# Patient Record
Sex: Female | Born: 1970 | Race: Black or African American | Hispanic: No | Marital: Married | Smoking: Never smoker
Health system: Southern US, Community
[De-identification: ages and names within clinical notes are randomized; demographics above are authoritative.]

## PROBLEM LIST (undated history)

## (undated) DIAGNOSIS — Z8489 Family history of other specified conditions: Secondary | ICD-10-CM

## (undated) DIAGNOSIS — R7303 Prediabetes: Secondary | ICD-10-CM

## (undated) DIAGNOSIS — G709 Myoneural disorder, unspecified: Secondary | ICD-10-CM

## (undated) DIAGNOSIS — Z87898 Personal history of other specified conditions: Secondary | ICD-10-CM

## (undated) DIAGNOSIS — K219 Gastro-esophageal reflux disease without esophagitis: Secondary | ICD-10-CM

## (undated) DIAGNOSIS — Z923 Personal history of irradiation: Secondary | ICD-10-CM

## (undated) DIAGNOSIS — Z86711 Personal history of pulmonary embolism: Secondary | ICD-10-CM

## (undated) DIAGNOSIS — K5909 Other constipation: Secondary | ICD-10-CM

## (undated) DIAGNOSIS — T7840XA Allergy, unspecified, initial encounter: Secondary | ICD-10-CM

## (undated) DIAGNOSIS — K589 Irritable bowel syndrome without diarrhea: Secondary | ICD-10-CM

## (undated) DIAGNOSIS — R102 Pelvic and perineal pain unspecified side: Secondary | ICD-10-CM

## (undated) DIAGNOSIS — C801 Malignant (primary) neoplasm, unspecified: Secondary | ICD-10-CM

## (undated) DIAGNOSIS — E785 Hyperlipidemia, unspecified: Secondary | ICD-10-CM

## (undated) DIAGNOSIS — J302 Other seasonal allergic rhinitis: Secondary | ICD-10-CM

## (undated) DIAGNOSIS — I10 Essential (primary) hypertension: Secondary | ICD-10-CM

## (undated) DIAGNOSIS — R809 Proteinuria, unspecified: Secondary | ICD-10-CM

## (undated) DIAGNOSIS — Z973 Presence of spectacles and contact lenses: Secondary | ICD-10-CM

## (undated) DIAGNOSIS — E890 Postprocedural hypothyroidism: Secondary | ICD-10-CM

## (undated) DIAGNOSIS — N289 Disorder of kidney and ureter, unspecified: Secondary | ICD-10-CM

## (undated) DIAGNOSIS — N2889 Other specified disorders of kidney and ureter: Secondary | ICD-10-CM

## (undated) DIAGNOSIS — N182 Chronic kidney disease, stage 2 (mild): Secondary | ICD-10-CM

## (undated) DIAGNOSIS — F419 Anxiety disorder, unspecified: Secondary | ICD-10-CM

## (undated) DIAGNOSIS — J3089 Other allergic rhinitis: Secondary | ICD-10-CM

## (undated) HISTORY — DX: Irritable bowel syndrome, unspecified: K58.9

## (undated) HISTORY — DX: Hyperlipidemia, unspecified: E78.5

## (undated) HISTORY — DX: Gastro-esophageal reflux disease without esophagitis: K21.9

## (undated) HISTORY — DX: Anxiety disorder, unspecified: F41.9

## (undated) HISTORY — DX: Essential (primary) hypertension: I10

## (undated) HISTORY — PX: RENAL BIOPSY: SHX156

## (undated) HISTORY — PX: ESOPHAGOGASTRODUODENOSCOPY: SHX1529

## (undated) HISTORY — DX: Myoneural disorder, unspecified: G70.9

## (undated) HISTORY — DX: Allergy, unspecified, initial encounter: T78.40XA

## (undated) HISTORY — PX: COLONOSCOPY: SHX174

---

## 1989-08-17 HISTORY — PX: THYROIDECTOMY: SHX17

## 1990-08-17 HISTORY — PX: TUBAL LIGATION: SHX77

## 1995-08-18 HISTORY — PX: LAPAROSCOPIC CHOLECYSTECTOMY: SUR755

## 1996-08-17 HISTORY — PX: ABDOMINAL HYSTERECTOMY: SHX81

## 1998-01-04 ENCOUNTER — Ambulatory Visit (HOSPITAL_COMMUNITY): Admission: RE | Admit: 1998-01-04 | Discharge: 1998-01-04 | Payer: Self-pay | Admitting: *Deleted

## 1998-01-11 ENCOUNTER — Ambulatory Visit (HOSPITAL_COMMUNITY): Admission: RE | Admit: 1998-01-11 | Discharge: 1998-01-11 | Payer: Self-pay | Admitting: *Deleted

## 1998-05-21 ENCOUNTER — Emergency Department (HOSPITAL_COMMUNITY): Admission: EM | Admit: 1998-05-21 | Discharge: 1998-05-22 | Payer: Self-pay | Admitting: Emergency Medicine

## 1998-06-26 ENCOUNTER — Emergency Department (HOSPITAL_COMMUNITY): Admission: EM | Admit: 1998-06-26 | Discharge: 1998-06-26 | Payer: Self-pay | Admitting: Emergency Medicine

## 1998-07-30 ENCOUNTER — Encounter: Payer: Self-pay | Admitting: *Deleted

## 1998-07-30 ENCOUNTER — Ambulatory Visit (HOSPITAL_COMMUNITY): Admission: RE | Admit: 1998-07-30 | Discharge: 1998-07-30 | Payer: Self-pay | Admitting: *Deleted

## 1998-08-05 ENCOUNTER — Ambulatory Visit (HOSPITAL_COMMUNITY): Admission: RE | Admit: 1998-08-05 | Discharge: 1998-08-05 | Payer: Self-pay | Admitting: *Deleted

## 1998-08-12 ENCOUNTER — Ambulatory Visit (HOSPITAL_COMMUNITY): Admission: RE | Admit: 1998-08-12 | Discharge: 1998-08-12 | Payer: Self-pay | Admitting: *Deleted

## 1998-09-12 ENCOUNTER — Ambulatory Visit (HOSPITAL_COMMUNITY): Admission: RE | Admit: 1998-09-12 | Discharge: 1998-09-12 | Payer: Self-pay | Admitting: *Deleted

## 1998-09-15 ENCOUNTER — Emergency Department (HOSPITAL_COMMUNITY): Admission: EM | Admit: 1998-09-15 | Discharge: 1998-09-15 | Payer: Self-pay | Admitting: Emergency Medicine

## 1998-09-30 ENCOUNTER — Ambulatory Visit (HOSPITAL_COMMUNITY): Admission: RE | Admit: 1998-09-30 | Discharge: 1998-09-30 | Payer: Self-pay | Admitting: *Deleted

## 1998-10-18 ENCOUNTER — Emergency Department (HOSPITAL_COMMUNITY): Admission: EM | Admit: 1998-10-18 | Discharge: 1998-10-19 | Payer: Self-pay | Admitting: Emergency Medicine

## 1998-11-08 ENCOUNTER — Ambulatory Visit (HOSPITAL_COMMUNITY): Admission: RE | Admit: 1998-11-08 | Discharge: 1998-11-08 | Payer: Self-pay | Admitting: *Deleted

## 1998-12-18 ENCOUNTER — Observation Stay (HOSPITAL_COMMUNITY): Admission: RE | Admit: 1998-12-18 | Discharge: 1998-12-19 | Payer: Self-pay | Admitting: Nephrology

## 1998-12-18 ENCOUNTER — Encounter: Payer: Self-pay | Admitting: Nephrology

## 1999-02-11 ENCOUNTER — Emergency Department (HOSPITAL_COMMUNITY): Admission: EM | Admit: 1999-02-11 | Discharge: 1999-02-11 | Payer: Self-pay | Admitting: Emergency Medicine

## 1999-04-16 ENCOUNTER — Encounter: Payer: Self-pay | Admitting: Emergency Medicine

## 1999-04-16 ENCOUNTER — Emergency Department (HOSPITAL_COMMUNITY): Admission: EM | Admit: 1999-04-16 | Discharge: 1999-04-16 | Payer: Self-pay | Admitting: Emergency Medicine

## 1999-04-29 ENCOUNTER — Ambulatory Visit (HOSPITAL_COMMUNITY): Admission: RE | Admit: 1999-04-29 | Discharge: 1999-04-29 | Payer: Self-pay | Admitting: *Deleted

## 1999-06-28 ENCOUNTER — Emergency Department (HOSPITAL_COMMUNITY): Admission: EM | Admit: 1999-06-28 | Discharge: 1999-06-28 | Payer: Self-pay | Admitting: Emergency Medicine

## 1999-06-28 ENCOUNTER — Encounter: Payer: Self-pay | Admitting: Emergency Medicine

## 1999-07-16 ENCOUNTER — Encounter: Payer: Self-pay | Admitting: *Deleted

## 1999-07-16 ENCOUNTER — Emergency Department (HOSPITAL_COMMUNITY): Admission: EM | Admit: 1999-07-16 | Discharge: 1999-07-16 | Payer: Self-pay | Admitting: Emergency Medicine

## 1999-09-14 ENCOUNTER — Emergency Department (HOSPITAL_COMMUNITY): Admission: EM | Admit: 1999-09-14 | Discharge: 1999-09-14 | Payer: Self-pay

## 1999-10-09 ENCOUNTER — Other Ambulatory Visit: Admission: RE | Admit: 1999-10-09 | Discharge: 1999-10-09 | Payer: Self-pay | Admitting: Obstetrics

## 1999-12-07 ENCOUNTER — Emergency Department (HOSPITAL_COMMUNITY): Admission: EM | Admit: 1999-12-07 | Discharge: 1999-12-07 | Payer: Self-pay | Admitting: Emergency Medicine

## 2000-01-24 ENCOUNTER — Emergency Department (HOSPITAL_COMMUNITY): Admission: EM | Admit: 2000-01-24 | Discharge: 2000-01-24 | Payer: Self-pay | Admitting: Emergency Medicine

## 2000-01-27 ENCOUNTER — Encounter: Payer: Self-pay | Admitting: Emergency Medicine

## 2000-01-27 ENCOUNTER — Ambulatory Visit (HOSPITAL_COMMUNITY): Admission: RE | Admit: 2000-01-27 | Discharge: 2000-01-27 | Payer: Self-pay | Admitting: Emergency Medicine

## 2000-07-23 ENCOUNTER — Emergency Department (HOSPITAL_COMMUNITY): Admission: EM | Admit: 2000-07-23 | Discharge: 2000-07-24 | Payer: Self-pay | Admitting: Emergency Medicine

## 2000-08-16 ENCOUNTER — Encounter: Payer: Self-pay | Admitting: Emergency Medicine

## 2000-08-16 ENCOUNTER — Emergency Department (HOSPITAL_COMMUNITY): Admission: EM | Admit: 2000-08-16 | Discharge: 2000-08-16 | Payer: Self-pay | Admitting: Emergency Medicine

## 2000-11-04 ENCOUNTER — Other Ambulatory Visit: Admission: RE | Admit: 2000-11-04 | Discharge: 2000-11-04 | Payer: Self-pay | Admitting: Obstetrics

## 2001-07-24 ENCOUNTER — Emergency Department (HOSPITAL_COMMUNITY): Admission: EM | Admit: 2001-07-24 | Discharge: 2001-07-24 | Payer: Self-pay | Admitting: Emergency Medicine

## 2001-09-23 ENCOUNTER — Ambulatory Visit (HOSPITAL_COMMUNITY): Admission: RE | Admit: 2001-09-23 | Discharge: 2001-09-23 | Payer: Self-pay | Admitting: *Deleted

## 2002-04-07 ENCOUNTER — Emergency Department (HOSPITAL_COMMUNITY): Admission: EM | Admit: 2002-04-07 | Discharge: 2002-04-07 | Payer: Self-pay | Admitting: Emergency Medicine

## 2002-04-07 ENCOUNTER — Encounter: Payer: Self-pay | Admitting: Emergency Medicine

## 2002-10-30 ENCOUNTER — Emergency Department (HOSPITAL_COMMUNITY): Admission: EM | Admit: 2002-10-30 | Discharge: 2002-10-30 | Payer: Self-pay | Admitting: Emergency Medicine

## 2002-10-30 ENCOUNTER — Encounter: Payer: Self-pay | Admitting: Emergency Medicine

## 2003-02-16 ENCOUNTER — Ambulatory Visit (HOSPITAL_COMMUNITY): Admission: RE | Admit: 2003-02-16 | Discharge: 2003-02-16 | Payer: Self-pay | Admitting: *Deleted

## 2003-03-19 ENCOUNTER — Emergency Department (HOSPITAL_COMMUNITY): Admission: EM | Admit: 2003-03-19 | Discharge: 2003-03-19 | Payer: Self-pay | Admitting: *Deleted

## 2003-03-19 ENCOUNTER — Encounter: Payer: Self-pay | Admitting: *Deleted

## 2004-08-19 ENCOUNTER — Ambulatory Visit: Payer: Self-pay | Admitting: Internal Medicine

## 2004-09-03 ENCOUNTER — Encounter: Admission: RE | Admit: 2004-09-03 | Discharge: 2004-09-03 | Payer: Self-pay | Admitting: Gastroenterology

## 2004-10-03 ENCOUNTER — Ambulatory Visit (HOSPITAL_COMMUNITY): Admission: RE | Admit: 2004-10-03 | Discharge: 2004-10-03 | Payer: Self-pay | Admitting: Gastroenterology

## 2004-10-08 ENCOUNTER — Ambulatory Visit (HOSPITAL_COMMUNITY): Admission: RE | Admit: 2004-10-08 | Discharge: 2004-10-08 | Payer: Self-pay | Admitting: Obstetrics

## 2005-01-01 ENCOUNTER — Emergency Department (HOSPITAL_COMMUNITY): Admission: EM | Admit: 2005-01-01 | Discharge: 2005-01-01 | Payer: Self-pay | Admitting: Emergency Medicine

## 2005-04-08 ENCOUNTER — Ambulatory Visit: Payer: Self-pay | Admitting: Internal Medicine

## 2005-05-06 ENCOUNTER — Ambulatory Visit: Payer: Self-pay | Admitting: Internal Medicine

## 2005-08-31 ENCOUNTER — Ambulatory Visit: Payer: Self-pay | Admitting: Internal Medicine

## 2005-10-16 ENCOUNTER — Emergency Department (HOSPITAL_COMMUNITY): Admission: EM | Admit: 2005-10-16 | Discharge: 2005-10-17 | Payer: Self-pay | Admitting: Emergency Medicine

## 2005-10-20 ENCOUNTER — Ambulatory Visit: Payer: Self-pay | Admitting: Internal Medicine

## 2005-10-23 ENCOUNTER — Emergency Department (HOSPITAL_COMMUNITY): Admission: EM | Admit: 2005-10-23 | Discharge: 2005-10-23 | Payer: Self-pay | Admitting: Emergency Medicine

## 2005-10-28 ENCOUNTER — Encounter: Admission: RE | Admit: 2005-10-28 | Discharge: 2005-10-28 | Payer: Self-pay | Admitting: Internal Medicine

## 2005-12-17 ENCOUNTER — Ambulatory Visit: Payer: Self-pay | Admitting: Internal Medicine

## 2006-05-31 ENCOUNTER — Emergency Department (HOSPITAL_COMMUNITY): Admission: EM | Admit: 2006-05-31 | Discharge: 2006-05-31 | Payer: Self-pay | Admitting: Emergency Medicine

## 2006-06-22 ENCOUNTER — Encounter: Admission: RE | Admit: 2006-06-22 | Discharge: 2006-06-22 | Payer: Self-pay | Admitting: Gastroenterology

## 2006-08-18 ENCOUNTER — Ambulatory Visit: Payer: Self-pay | Admitting: Internal Medicine

## 2006-11-10 ENCOUNTER — Ambulatory Visit: Payer: Self-pay | Admitting: Internal Medicine

## 2006-12-03 ENCOUNTER — Encounter: Payer: Self-pay | Admitting: Internal Medicine

## 2007-01-14 ENCOUNTER — Ambulatory Visit: Payer: Self-pay | Admitting: Internal Medicine

## 2007-03-09 ENCOUNTER — Inpatient Hospital Stay (HOSPITAL_COMMUNITY): Admission: EM | Admit: 2007-03-09 | Discharge: 2007-03-15 | Payer: Self-pay | Admitting: Emergency Medicine

## 2007-03-10 ENCOUNTER — Ambulatory Visit: Payer: Self-pay | Admitting: Vascular Surgery

## 2007-03-15 ENCOUNTER — Ambulatory Visit: Payer: Self-pay | Admitting: Internal Medicine

## 2007-03-16 ENCOUNTER — Ambulatory Visit: Payer: Self-pay | Admitting: Internal Medicine

## 2007-03-16 DIAGNOSIS — I824Y9 Acute embolism and thrombosis of unspecified deep veins of unspecified proximal lower extremity: Secondary | ICD-10-CM | POA: Insufficient documentation

## 2007-03-16 LAB — CONVERTED CEMR LAB: INR: 2.6

## 2007-03-17 ENCOUNTER — Telehealth: Payer: Self-pay | Admitting: Internal Medicine

## 2007-03-17 ENCOUNTER — Ambulatory Visit: Payer: Self-pay | Admitting: Internal Medicine

## 2007-03-17 DIAGNOSIS — I10 Essential (primary) hypertension: Secondary | ICD-10-CM | POA: Insufficient documentation

## 2007-03-17 DIAGNOSIS — N289 Disorder of kidney and ureter, unspecified: Secondary | ICD-10-CM | POA: Insufficient documentation

## 2007-03-17 LAB — CONVERTED CEMR LAB
INR: 3.1
Prothrombin Time: 21.1 s

## 2007-03-18 ENCOUNTER — Encounter: Payer: Self-pay | Admitting: Internal Medicine

## 2007-03-21 ENCOUNTER — Ambulatory Visit: Payer: Self-pay | Admitting: Internal Medicine

## 2007-03-21 LAB — CONVERTED CEMR LAB

## 2007-03-25 ENCOUNTER — Encounter: Payer: Self-pay | Admitting: Internal Medicine

## 2007-03-27 ENCOUNTER — Emergency Department (HOSPITAL_COMMUNITY): Admission: EM | Admit: 2007-03-27 | Discharge: 2007-03-27 | Payer: Self-pay | Admitting: Emergency Medicine

## 2007-03-29 ENCOUNTER — Telehealth (INDEPENDENT_AMBULATORY_CARE_PROVIDER_SITE_OTHER): Payer: Self-pay | Admitting: *Deleted

## 2007-03-30 ENCOUNTER — Telehealth: Payer: Self-pay | Admitting: Internal Medicine

## 2007-04-07 ENCOUNTER — Ambulatory Visit: Payer: Self-pay | Admitting: Internal Medicine

## 2007-04-07 ENCOUNTER — Telehealth: Payer: Self-pay | Admitting: Internal Medicine

## 2007-04-07 LAB — CONVERTED CEMR LAB: INR: 3.3

## 2007-04-24 ENCOUNTER — Emergency Department (HOSPITAL_COMMUNITY): Admission: EM | Admit: 2007-04-24 | Discharge: 2007-04-24 | Payer: Self-pay | Admitting: Emergency Medicine

## 2007-04-25 ENCOUNTER — Telehealth (INDEPENDENT_AMBULATORY_CARE_PROVIDER_SITE_OTHER): Payer: Self-pay | Admitting: *Deleted

## 2007-04-27 ENCOUNTER — Ambulatory Visit: Payer: Self-pay | Admitting: Internal Medicine

## 2007-04-27 DIAGNOSIS — F411 Generalized anxiety disorder: Secondary | ICD-10-CM | POA: Insufficient documentation

## 2007-04-27 DIAGNOSIS — I10 Essential (primary) hypertension: Secondary | ICD-10-CM | POA: Insufficient documentation

## 2007-05-09 ENCOUNTER — Ambulatory Visit: Payer: Self-pay | Admitting: Internal Medicine

## 2007-05-09 LAB — CONVERTED CEMR LAB: Prothrombin Time: 22.6 s

## 2007-05-20 ENCOUNTER — Encounter: Payer: Self-pay | Admitting: Internal Medicine

## 2007-05-23 ENCOUNTER — Encounter: Admission: RE | Admit: 2007-05-23 | Discharge: 2007-05-23 | Payer: Self-pay | Admitting: Gastroenterology

## 2007-05-23 ENCOUNTER — Ambulatory Visit: Payer: Self-pay | Admitting: Internal Medicine

## 2007-05-23 LAB — CONVERTED CEMR LAB
INR: 4.4
Prothrombin Time: 25.5 s

## 2007-05-24 ENCOUNTER — Emergency Department (HOSPITAL_COMMUNITY): Admission: EM | Admit: 2007-05-24 | Discharge: 2007-05-24 | Payer: Self-pay | Admitting: Emergency Medicine

## 2007-05-25 ENCOUNTER — Ambulatory Visit: Payer: Self-pay | Admitting: Internal Medicine

## 2007-05-26 ENCOUNTER — Ambulatory Visit: Payer: Self-pay | Admitting: Internal Medicine

## 2007-05-26 ENCOUNTER — Observation Stay (HOSPITAL_COMMUNITY): Admission: EM | Admit: 2007-05-26 | Discharge: 2007-05-28 | Payer: Self-pay | Admitting: Emergency Medicine

## 2007-05-27 ENCOUNTER — Encounter: Payer: Self-pay | Admitting: Internal Medicine

## 2007-05-27 ENCOUNTER — Ambulatory Visit: Payer: Self-pay | Admitting: Internal Medicine

## 2007-05-30 ENCOUNTER — Telehealth: Payer: Self-pay | Admitting: Internal Medicine

## 2007-05-31 ENCOUNTER — Telehealth: Payer: Self-pay | Admitting: Internal Medicine

## 2007-05-31 ENCOUNTER — Emergency Department (HOSPITAL_COMMUNITY): Admission: EM | Admit: 2007-05-31 | Discharge: 2007-05-31 | Payer: Self-pay | Admitting: Emergency Medicine

## 2007-06-03 ENCOUNTER — Ambulatory Visit: Payer: Self-pay | Admitting: Internal Medicine

## 2007-06-05 ENCOUNTER — Emergency Department (HOSPITAL_COMMUNITY): Admission: EM | Admit: 2007-06-05 | Discharge: 2007-06-05 | Payer: Self-pay | Admitting: Emergency Medicine

## 2007-06-07 ENCOUNTER — Ambulatory Visit: Payer: Self-pay | Admitting: Internal Medicine

## 2007-06-07 DIAGNOSIS — K219 Gastro-esophageal reflux disease without esophagitis: Secondary | ICD-10-CM | POA: Insufficient documentation

## 2007-06-10 ENCOUNTER — Telehealth: Payer: Self-pay | Admitting: Internal Medicine

## 2007-06-13 ENCOUNTER — Ambulatory Visit (HOSPITAL_COMMUNITY): Admission: RE | Admit: 2007-06-13 | Discharge: 2007-06-13 | Payer: Self-pay | Admitting: Gastroenterology

## 2007-06-29 ENCOUNTER — Ambulatory Visit: Payer: Self-pay | Admitting: Internal Medicine

## 2007-06-29 ENCOUNTER — Telehealth: Payer: Self-pay | Admitting: Internal Medicine

## 2007-07-27 ENCOUNTER — Ambulatory Visit: Payer: Self-pay | Admitting: Internal Medicine

## 2007-07-27 LAB — CONVERTED CEMR LAB
INR: 1.3
Prothrombin Time: 14 s

## 2007-07-28 ENCOUNTER — Emergency Department (HOSPITAL_COMMUNITY): Admission: EM | Admit: 2007-07-28 | Discharge: 2007-07-28 | Payer: Self-pay | Admitting: Emergency Medicine

## 2007-08-10 ENCOUNTER — Ambulatory Visit: Payer: Self-pay | Admitting: Internal Medicine

## 2007-08-10 LAB — CONVERTED CEMR LAB: Prothrombin Time: 15.5 s

## 2007-08-31 ENCOUNTER — Ambulatory Visit: Payer: Self-pay | Admitting: Internal Medicine

## 2007-08-31 LAB — CONVERTED CEMR LAB
Blood in Urine, dipstick: NEGATIVE
Ketones, urine, test strip: NEGATIVE
Nitrite: NEGATIVE
Protein, U semiquant: NEGATIVE
Prothrombin Time: 16 s
Urobilinogen, UA: NEGATIVE
WBC Urine, dipstick: NEGATIVE

## 2007-09-28 ENCOUNTER — Telehealth: Payer: Self-pay | Admitting: Internal Medicine

## 2007-09-29 ENCOUNTER — Encounter: Payer: Self-pay | Admitting: Internal Medicine

## 2007-10-03 ENCOUNTER — Telehealth (INDEPENDENT_AMBULATORY_CARE_PROVIDER_SITE_OTHER): Payer: Self-pay | Admitting: *Deleted

## 2007-10-04 ENCOUNTER — Ambulatory Visit: Payer: Self-pay | Admitting: Internal Medicine

## 2007-10-05 ENCOUNTER — Encounter: Payer: Self-pay | Admitting: Internal Medicine

## 2007-10-27 ENCOUNTER — Encounter: Payer: Self-pay | Admitting: Internal Medicine

## 2007-11-15 ENCOUNTER — Ambulatory Visit: Payer: Self-pay | Admitting: Internal Medicine

## 2007-11-15 DIAGNOSIS — J309 Allergic rhinitis, unspecified: Secondary | ICD-10-CM | POA: Insufficient documentation

## 2007-12-22 ENCOUNTER — Emergency Department (HOSPITAL_COMMUNITY): Admission: EM | Admit: 2007-12-22 | Discharge: 2007-12-23 | Payer: Self-pay | Admitting: Emergency Medicine

## 2008-01-10 ENCOUNTER — Ambulatory Visit: Payer: Self-pay | Admitting: Internal Medicine

## 2008-01-10 LAB — CONVERTED CEMR LAB
Bilirubin Urine: NEGATIVE
Ketones, urine, test strip: NEGATIVE
Nitrite: NEGATIVE
Protein, U semiquant: >=300
Urobilinogen, UA: NEGATIVE

## 2008-02-14 ENCOUNTER — Ambulatory Visit: Payer: Self-pay | Admitting: Internal Medicine

## 2008-02-14 DIAGNOSIS — M549 Dorsalgia, unspecified: Secondary | ICD-10-CM | POA: Insufficient documentation

## 2008-02-14 LAB — CONVERTED CEMR LAB
Bilirubin Urine: NEGATIVE
Glucose, Urine, Semiquant: NEGATIVE
Ketones, urine, test strip: NEGATIVE
Protein, U semiquant: 30
Urobilinogen, UA: NEGATIVE
pH: 5

## 2008-04-18 ENCOUNTER — Encounter: Payer: Self-pay | Admitting: Internal Medicine

## 2008-04-19 ENCOUNTER — Encounter: Payer: Self-pay | Admitting: Internal Medicine

## 2008-05-14 ENCOUNTER — Ambulatory Visit: Payer: Self-pay | Admitting: Internal Medicine

## 2008-08-07 ENCOUNTER — Ambulatory Visit: Payer: Self-pay | Admitting: Internal Medicine

## 2008-08-07 LAB — CONVERTED CEMR LAB
Bilirubin Urine: NEGATIVE
Blood in Urine, dipstick: NEGATIVE
Glucose, Urine, Semiquant: NEGATIVE
Protein, U semiquant: >=300
Specific Gravity, Urine: 1.025
pH: 5

## 2008-09-14 ENCOUNTER — Ambulatory Visit: Payer: Self-pay | Admitting: Internal Medicine

## 2008-09-24 ENCOUNTER — Encounter: Payer: Self-pay | Admitting: Internal Medicine

## 2008-10-03 ENCOUNTER — Encounter: Payer: Self-pay | Admitting: Internal Medicine

## 2008-10-11 ENCOUNTER — Encounter: Payer: Self-pay | Admitting: Internal Medicine

## 2008-12-09 IMAGING — CT CT ABDOMEN W/ CM
2 of 5 series · 17 of 46 positions shown, 19 images · IV contrast (READICAT/WATER & [ID] OMNI 300)
Comparison: 06/22/06.  
CT ABDOMEN WITH CONTRAST:

CLINICAL DATA: Abdominal pain and bloating.  
CT ABDOMEN AND PELVIS WITH CONTRAST:
TECHNIQUE: Multidetector CT imaging of the abdomen and pelvis was performed following the standard protocol during bolus administration of intravenous contrast.
Contrast:  077cc Omnipaque 300.

[Series 3: routine abdomen · axial · 0.70mm/px · z∈[-368,-42]mm · 14 of 73 slices shown, 16 images]
[im 5/73  soft-tissue]
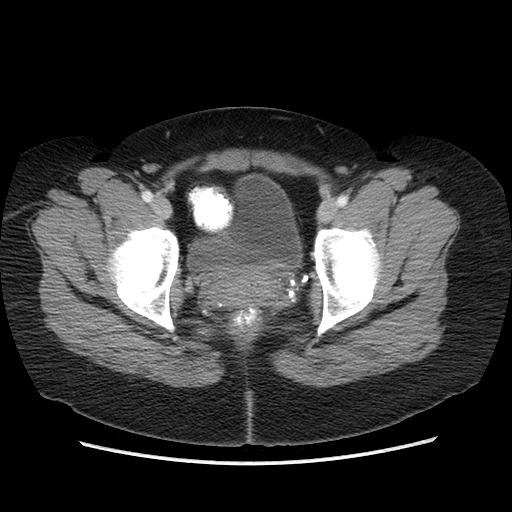
[im 5/73  bone]
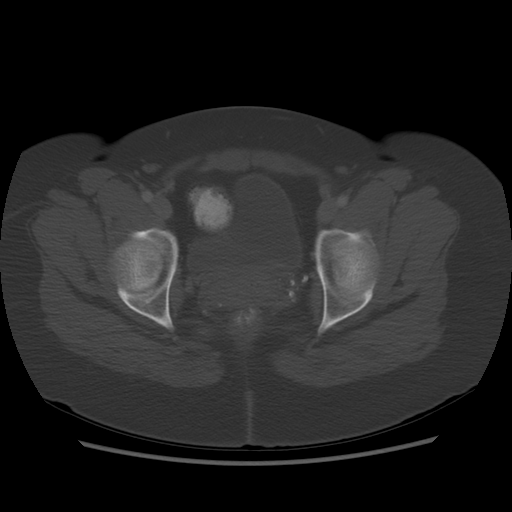
[im 9/73  soft-tissue]
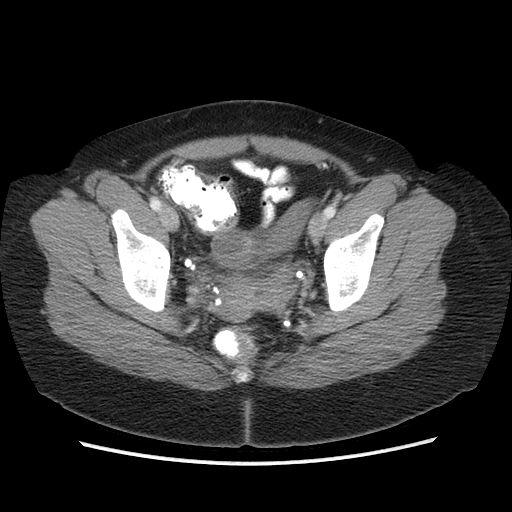
[im 17/73  soft-tissue]
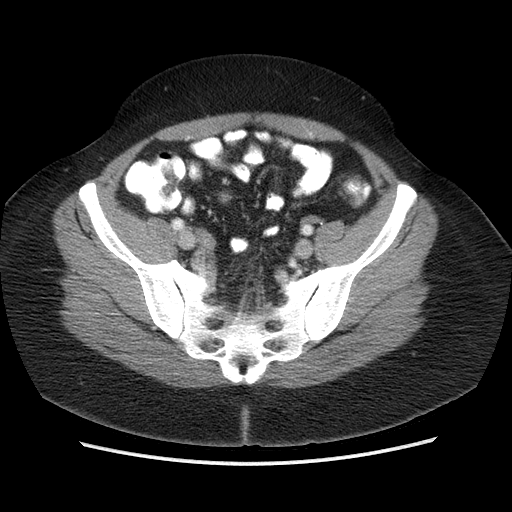
[im 21/73  soft-tissue]
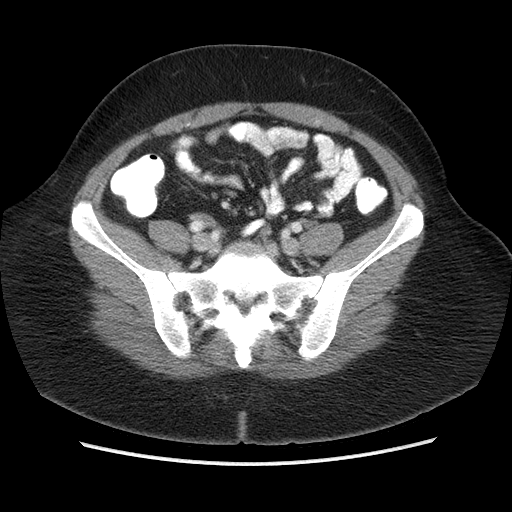
[im 25/73  soft-tissue]
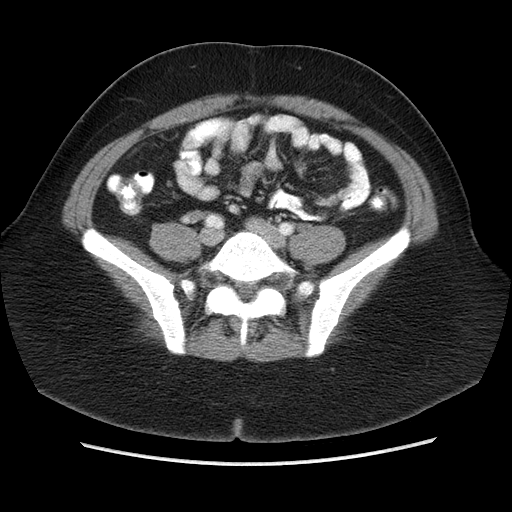
[im 29/73  soft-tissue]
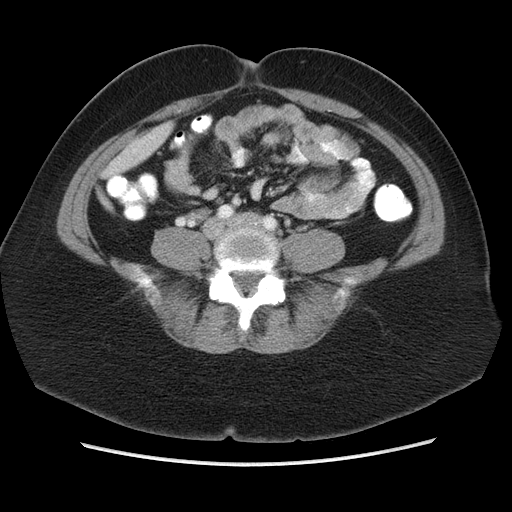
[im 33/73  soft-tissue]
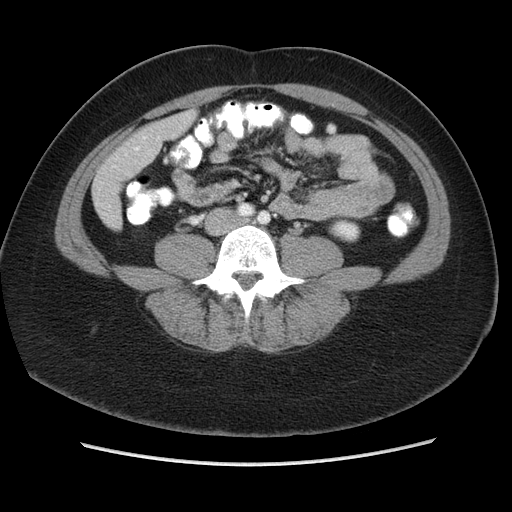
[im 41/73  soft-tissue]
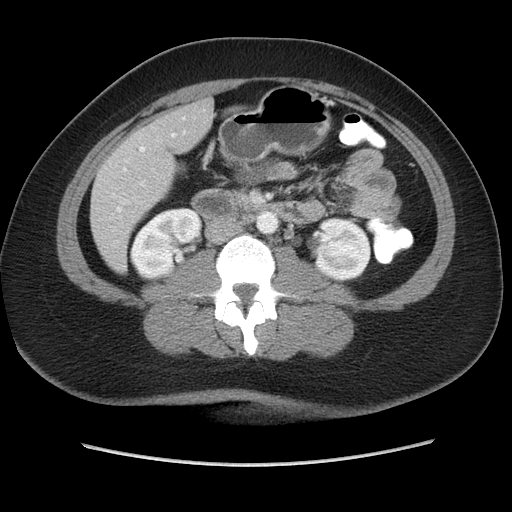
[im 45/73  soft-tissue]
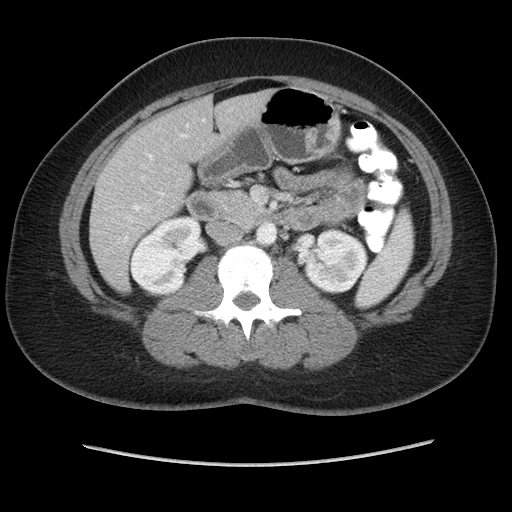
[im 45/73  bone]
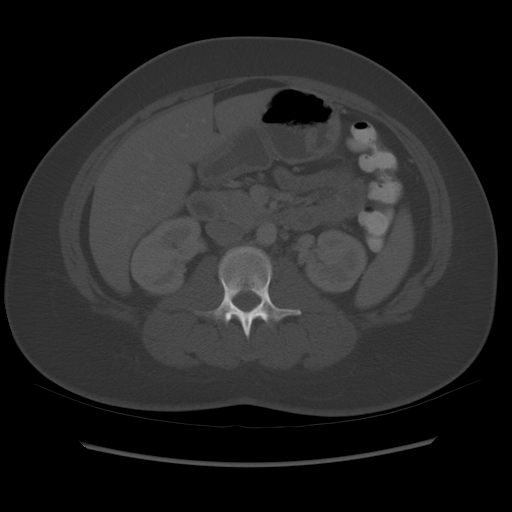
[im 49/73  soft-tissue]
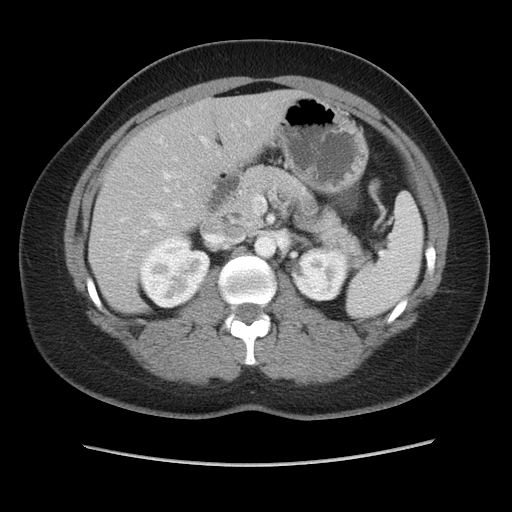
[im 53/73  soft-tissue]
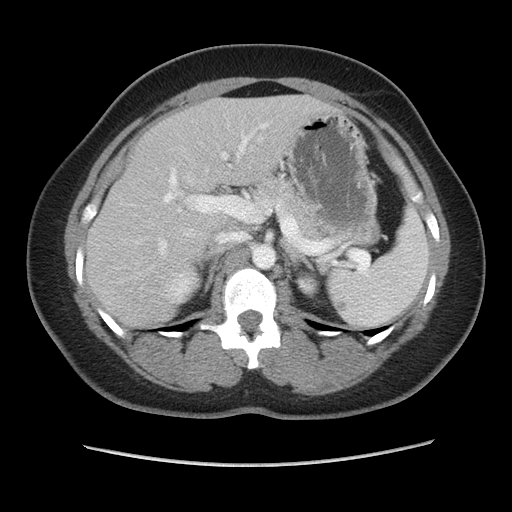
[im 57/73  soft-tissue]
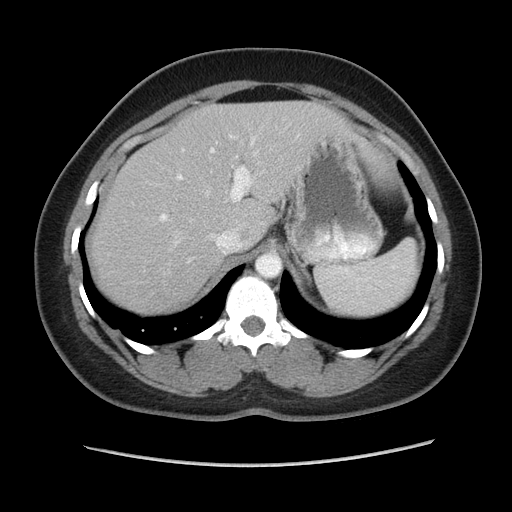
[im 65/73  soft-tissue]
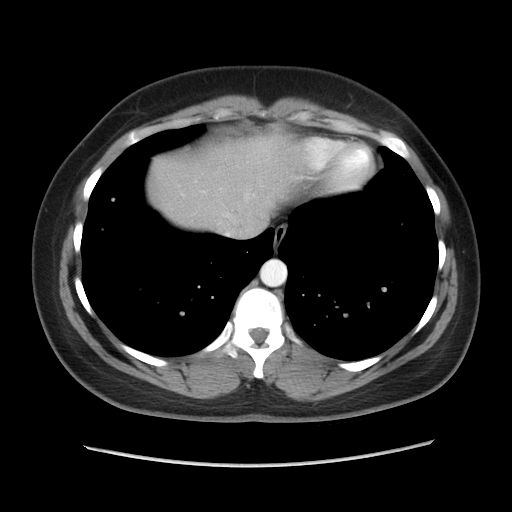
[im 69/73  soft-tissue]
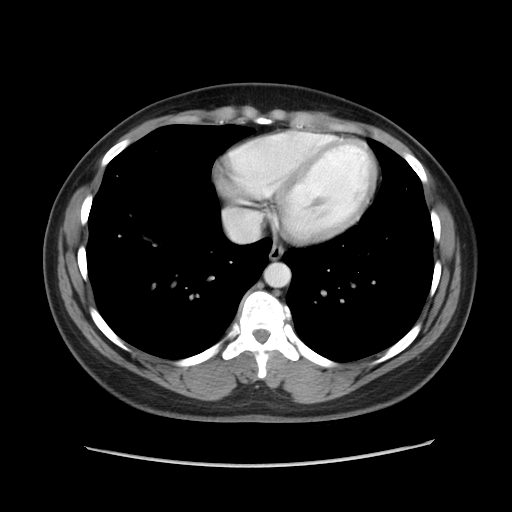

[Series 602: sagittal body · sagittal · 0.87mm/px · 3 of 145 slices shown]
[im 49/145  soft-tissue]
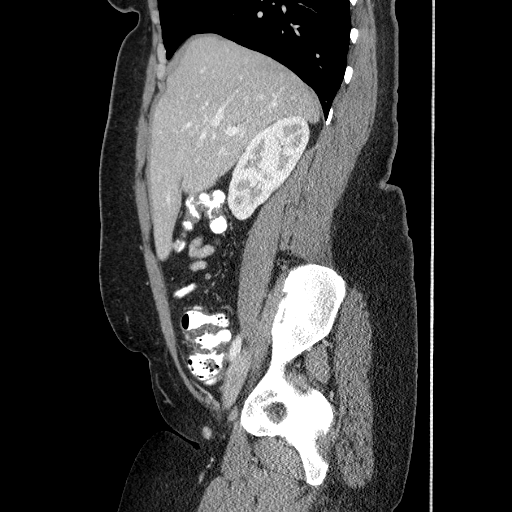
[im 65/145  soft-tissue]
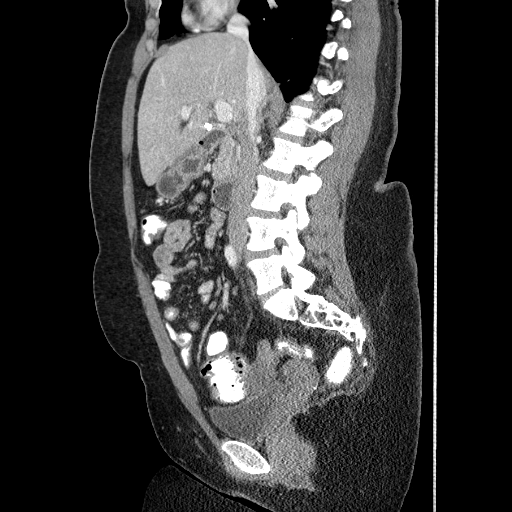
[im 81/145  soft-tissue]
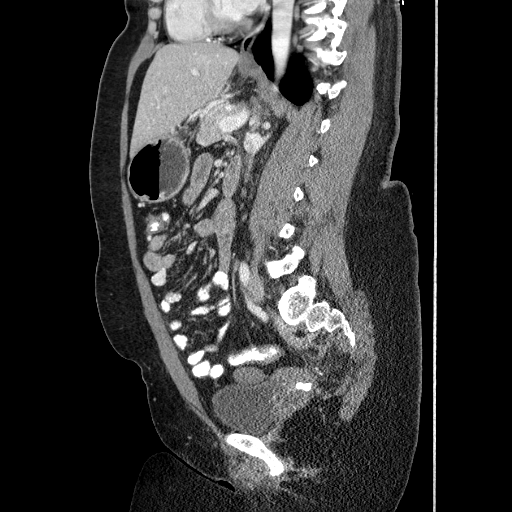

[17 of 46 positions shown; findings below may reference images not displayed]

FINDINGS: Liver measures approximately 20cm.  No focal lesions.  Cholecystectomy.  Adrenal glands and right kidney unremarkable.   There is duplication of the left renal collecting system.  The joining point of the duplication is difficult to see on today?s exam.  A small low attenuation lesion in the spleen is unchanged.  Pancreas, spleen, and small bowel unremarkable.  Small bowel and ileocolic mesenteric lymph nodes are stable.  No pathologically enlarged lymph nodes.
IMPRESSION: 1.  Mild hepatomegaly. 
2.  Duplicated left renal collecting system. 
CT PELVIS WITH CONTRAST:
FINDINGS: Probable uterine fibroid.  Colon unremarkable.  Ovaries are visualized.  No free fluid.  No pathologically enlarged lymph nodes.  No worrisome lytic or sclerotic lesions.
IMPRESSION: Probable uterine fibroid.

## 2008-12-13 ENCOUNTER — Ambulatory Visit: Payer: Self-pay | Admitting: Family Medicine

## 2008-12-19 ENCOUNTER — Telehealth: Payer: Self-pay | Admitting: Internal Medicine

## 2009-02-26 ENCOUNTER — Ambulatory Visit: Payer: Self-pay | Admitting: Internal Medicine

## 2009-02-26 LAB — CONVERTED CEMR LAB
Glucose, Urine, Semiquant: NEGATIVE
Nitrite: POSITIVE
Specific Gravity, Urine: >=1.03
pH: 6

## 2009-03-28 ENCOUNTER — Emergency Department (HOSPITAL_COMMUNITY): Admission: EM | Admit: 2009-03-28 | Discharge: 2009-03-28 | Payer: Self-pay | Admitting: Emergency Medicine

## 2009-04-12 ENCOUNTER — Encounter: Admission: RE | Admit: 2009-04-12 | Discharge: 2009-04-12 | Payer: Self-pay | Admitting: Obstetrics

## 2009-04-15 ENCOUNTER — Telehealth: Payer: Self-pay | Admitting: Internal Medicine

## 2009-04-19 ENCOUNTER — Encounter: Admission: RE | Admit: 2009-04-19 | Discharge: 2009-04-19 | Payer: Self-pay | Admitting: Obstetrics

## 2009-04-26 ENCOUNTER — Encounter: Payer: Self-pay | Admitting: Internal Medicine

## 2009-06-04 ENCOUNTER — Telehealth: Payer: Self-pay | Admitting: Internal Medicine

## 2009-06-26 ENCOUNTER — Ambulatory Visit: Payer: Self-pay | Admitting: Internal Medicine

## 2009-07-26 ENCOUNTER — Ambulatory Visit: Payer: Self-pay | Admitting: Internal Medicine

## 2009-07-26 LAB — CONVERTED CEMR LAB
Nitrite: NEGATIVE
Specific Gravity, Urine: 1.025
Urobilinogen, UA: 0.2

## 2009-08-15 ENCOUNTER — Observation Stay (HOSPITAL_COMMUNITY): Admission: EM | Admit: 2009-08-15 | Discharge: 2009-08-17 | Payer: Self-pay | Admitting: Emergency Medicine

## 2009-08-20 ENCOUNTER — Ambulatory Visit: Payer: Self-pay | Admitting: Cardiology

## 2009-08-20 ENCOUNTER — Telehealth: Payer: Self-pay | Admitting: Internal Medicine

## 2009-08-20 ENCOUNTER — Observation Stay (HOSPITAL_COMMUNITY): Admission: EM | Admit: 2009-08-20 | Discharge: 2009-08-21 | Payer: Self-pay | Admitting: Emergency Medicine

## 2009-08-26 ENCOUNTER — Ambulatory Visit: Payer: Self-pay | Admitting: Internal Medicine

## 2009-08-26 DIAGNOSIS — E785 Hyperlipidemia, unspecified: Secondary | ICD-10-CM | POA: Insufficient documentation

## 2009-08-30 ENCOUNTER — Telehealth: Payer: Self-pay | Admitting: Internal Medicine

## 2009-09-23 ENCOUNTER — Encounter: Payer: Self-pay | Admitting: Internal Medicine

## 2009-09-24 ENCOUNTER — Encounter: Payer: Self-pay | Admitting: Internal Medicine

## 2009-09-26 ENCOUNTER — Encounter: Payer: Self-pay | Admitting: Internal Medicine

## 2009-11-14 ENCOUNTER — Emergency Department (HOSPITAL_COMMUNITY): Admission: EM | Admit: 2009-11-14 | Discharge: 2009-11-14 | Payer: Self-pay | Admitting: Family Medicine

## 2009-11-18 ENCOUNTER — Ambulatory Visit: Payer: Self-pay | Admitting: Family Medicine

## 2010-01-29 ENCOUNTER — Emergency Department (HOSPITAL_COMMUNITY): Admission: EM | Admit: 2010-01-29 | Discharge: 2010-01-29 | Payer: Self-pay | Admitting: Emergency Medicine

## 2010-02-12 ENCOUNTER — Telehealth: Payer: Self-pay | Admitting: Internal Medicine

## 2010-03-18 ENCOUNTER — Telehealth: Payer: Self-pay | Admitting: Internal Medicine

## 2010-04-04 ENCOUNTER — Ambulatory Visit: Payer: Self-pay | Admitting: Internal Medicine

## 2010-04-04 LAB — CONVERTED CEMR LAB
BUN: 13 mg/dL (ref 6–23)
Calcium: 9.5 mg/dL (ref 8.4–10.5)
Creatinine, Ser: 1 mg/dL (ref 0.4–1.2)
GFR calc non Af Amer: 67.21 mL/min (ref >60)
Glucose, Bld: 90 mg/dL (ref 70–99)

## 2010-04-17 ENCOUNTER — Telehealth: Payer: Self-pay | Admitting: Internal Medicine

## 2010-04-27 ENCOUNTER — Emergency Department (HOSPITAL_COMMUNITY): Admission: EM | Admit: 2010-04-27 | Discharge: 2010-04-27 | Payer: Self-pay | Admitting: Emergency Medicine

## 2010-05-20 ENCOUNTER — Ambulatory Visit: Payer: Self-pay | Admitting: Internal Medicine

## 2010-06-17 ENCOUNTER — Encounter: Admission: RE | Admit: 2010-06-17 | Discharge: 2010-06-17 | Payer: Self-pay | Admitting: Internal Medicine

## 2010-08-01 ENCOUNTER — Telehealth: Payer: Self-pay | Admitting: Internal Medicine

## 2010-08-26 ENCOUNTER — Ambulatory Visit
Admission: RE | Admit: 2010-08-26 | Discharge: 2010-08-26 | Payer: Self-pay | Source: Home / Self Care | Attending: Internal Medicine | Admitting: Internal Medicine

## 2010-08-26 LAB — CONVERTED CEMR LAB
Nitrite: POSITIVE
Specific Gravity, Urine: 1.02

## 2010-09-07 ENCOUNTER — Encounter: Payer: Self-pay | Admitting: Gastroenterology

## 2010-09-16 NOTE — Assessment & Plan Note (Signed)
Summary: 6 month fup//ccm/pt rsc/cjr   Vital Signs:  Patient profile:   40 year old female Weight:      215 pounds Temp:     98.4 degrees F oral BP sitting:   122 / 80  (left arm) Cuff size:   regular  Vitals Entered By: Duard Brady LPN (April 04, 2010 11:44 AM) CC: 6 mos rov - doing ok - going through a separation Is Patient Diabetic? No   CC:  6 mos rov - doing ok - going through a separation.  History of Present Illness: 40 year old patient who is seen today for follow up of her hypertension.  The past two weeks.  She has had a nonproductive cough.  Antihypertensive treatment includes ACE inhibition.  She is treated hypothyroidism.  She has a history of chronic kidney disease, which has been stable.  Allergies: 1)  ! Adult Aspirin Ec Low Strength (Aspirin)  Past History:  Past Medical History: Reviewed history from 08/26/2009 and no changes required. pulmonary embolism, July 2008 C1Q nephropathy Hypertension Allergic rhinitis IBS Hyperlipidemia  Review of Systems       The patient complains of prolonged cough.  The patient denies anorexia, fever, weight loss, weight gain, vision loss, decreased hearing, hoarseness, chest pain, syncope, dyspnea on exertion, peripheral edema, headaches, hemoptysis, abdominal pain, melena, hematochezia, severe indigestion/heartburn, hematuria, incontinence, genital sores, muscle weakness, suspicious skin lesions, transient blindness, difficulty walking, depression, unusual weight change, abnormal bleeding, enlarged lymph nodes, angioedema, and breast masses.    Physical Exam  General:  Well-developed,well-nourished,in no acute distress; alert,appropriate and cooperative throughout examination Head:  Normocephalic and atraumatic without obvious abnormalities. No apparent alopecia or balding. Eyes:  No corneal or conjunctival inflammation noted. EOMI. Perrla. Funduscopic exam benign, without hemorrhages, exudates or papilledema.  Vision grossly normal. Mouth:  Oral mucosa and oropharynx without lesions or exudates.  Teeth in good repair. Neck:  No deformities, masses, or tenderness noted. Lungs:  Normal respiratory effort, chest expands symmetrically. Lungs are clear to auscultation, no crackles or wheezes. Heart:  Normal rate and regular rhythm. S1 and S2 normal without gallop, murmur, click, rub or other extra sounds. Abdomen:  Bowel sounds positive,abdomen soft and non-tender without masses, organomegaly or hernias noted. Msk:  No deformity or scoliosis noted of thoracic or lumbar spine.     Impression & Recommendations:  Problem # 1:  HYPERTENSION (ICD-401.9)  Her updated medication list for this problem includes:    Metoprolol Tartrate 50 Mg Tabs (Metoprolol tartrate) .Marland Kitchen... 1 two times a day    Amlodipine Besylate 5 Mg Tabs (Amlodipine besylate) ..... One daily    Benazepril Hcl 40 Mg Tabs (Benazepril hcl)    Losartan Potassium 100 Mg Tabs (Losartan potassium) .Marland Kitchen... 1 once daily  Her updated medication list for this problem includes:    Metoprolol Tartrate 50 Mg Tabs (Metoprolol tartrate) .Marland Kitchen... 1 two times a day    Amlodipine Besylate 5 Mg Tabs (Amlodipine besylate) ..... One daily    Benazepril Hcl 40 Mg Tabs (Benazepril hcl)    Losartan Potassium 100 Mg Tabs (Losartan potassium) .Marland Kitchen... 1 once daily  Problem # 2:  RENAL DISEASE (ICD-593.9)  Specimen Handling (42706)  Orders: Venipuncture (23762) Specimen Handling (83151) TLB-BMP (Basic Metabolic Panel-BMET) (80048-METABOL)  Complete Medication List: 1)  Synthroid 137 Mcg Tabs (Levothyroxine sodium) .Marland Kitchen.. 1 once daily 2)  Alprazolam 0.5 Mg Tabs (Alprazolam) .... One every 8 hrs as needed 3)  Loratadine 10 Mg Tabs (Loratadine) .... One daily 4)  Promethazine Hcl  25 Mg Tabs (Promethazine hcl) .... One every  6 hrs for nausea 5)  Metoprolol Tartrate 50 Mg Tabs (Metoprolol tartrate) .Marland Kitchen.. 1 two times a day 6)  Amlodipine Besylate 5 Mg Tabs (Amlodipine  besylate) .... One daily 7)  Benazepril Hcl 40 Mg Tabs (Benazepril hcl) 8)  Tramadol Hcl 50 Mg Tabs (Tramadol hcl) .... One tablet every 6 hours as needed for pain 9)  Losartan Potassium 100 Mg Tabs (Losartan potassium) .Marland Kitchen.. 1 once daily 10)  Omeprazole 40 Mg Cpdr (Omeprazole) .Marland Kitchen.. 1 once daily 11)  Zovirax 5 % Crea (Acyclovir) .... Apply as directed  Patient Instructions: 1)  Please schedule a follow-up appointment in 6 months. 2)  Limit your Sodium (Salt) to less than 2 grams a day(slightly less than 1/2 a teaspoon) to prevent fluid retention, swelling, or worsening of symptoms. 3)  It is important that you exercise regularly at least 20 minutes 5 times a week. If you develop chest pain, have severe difficulty breathing, or feel very tired , stop exercising immediately and seek medical attention. 4)  You need to lose weight. Consider a lower calorie diet and regular exercise.  Prescriptions: ZOVIRAX 5 % CREA (ACYCLOVIR) apply as directed  #1 tube x 1   Entered and Authorized by:   Gordy Savers  MD   Signed by:   Gordy Savers  MD on 04/04/2010   Method used:   Electronically to        [facility] [phone]* (retail)       [address]       [place], [place]  [address]       Ph: [phone]       Fax: [phone]   RxID:   [id number] OMEPRAZOLE 40 MG CPDR (OMEPRAZOLE) 1 once daily  #90 x 4   Entered and Authorized by:   Gordy Savers  MD   Signed by:   Gordy Savers  MD on 04/04/2010   Method used:   Electronically to        [facility] [phone]* (retail)       [address]       [place], [place]  [address]       Ph: [phone]       Fax: [phone]   RxID:   [id number] TRAMADOL HCL 50 MG TABS (TRAMADOL HCL) one tablet every 6 hours as needed for pain  #100 x 5   Entered and Authorized by:   Gordy Savers  MD   Signed by:   Gordy Savers  MD on 04/04/2010   Method used:   Electronically to        [facility] [phone]* (retail)       [address]       [place], [place]  [address]       Ph: [phone]       Fax: [phone]   RxID:   [id number] LOSARTAN POTASSIUM 100 MG TABS (LOSARTAN POTASSIUM) 1 once daily  #90 x 6   Entered and Authorized by:   Gordy Savers  MD   Signed by:   Gordy Savers  MD on 04/04/2010   Method used:   Electronically to        [facility] [phone]* (retail)       [address]       [place], [place]  [address]       Ph: [phone]  Fax: [phone]   RxID:   [id number] AMLODIPINE BESYLATE 5 MG TABS (AMLODIPINE BESYLATE) one daily  #90 Each x 2   Entered and Authorized by:   Gordy Savers  MD   Signed by:   Gordy Savers  MD on 04/04/2010   Method used:   Electronically to        [facility] [phone]* (retail)       [address]       [place], [place]  [address]       Ph: [phone]       Fax: [phone]   RxID:   [id number] METOPROLOL TARTRATE 50 MG  TABS (METOPROLOL TARTRATE) 1 two times a day  #180 Each x 5   Entered and Authorized by:   Gordy Savers  MD   Signed by:   Gordy Savers  MD on 04/04/2010   Method used:   Electronically to        [facility] [phone]* (retail)       [address]       [place], [place]  [address]       Ph: [phone]       Fax: [phone]   RxID:   [id number] SYNTHROID 137 MCG TABS (LEVOTHYROXINE SODIUM) 1 once daily  #90 Each x 5   Entered and Authorized by:   Gordy Savers  MD   Signed by:   Gordy Savers  MD on 04/04/2010   Method used:   Electronically to        [facility] [phone]* (retail)       [address]       [place], [place]  [address]       Ph: [phone]       Fax: [phone]   RxID:   [id number]

## 2010-09-16 NOTE — Progress Notes (Signed)
Summary: referral  Phone Note Call from Patient Call back at Home Phone [phone]   Caller: vm Call For: kwia Summary of Call: Referral to [facility] because continuing to have pain left side shoulder, arm, leg. Initial call taken by: Rudy Jew, RN,  August 30, 2009 3:14 PM  Follow-up for Phone Call        suggest ROV next week to reassess type of referral if pain still present Follow-up by: Gordy Savers  MD,  August 30, 2009 4:44 PM  Additional Follow-up for Phone Call Additional follow up Details #1::        Phone Call Completed Additional Follow-up by: Rudy Jew, RN,  August 30, 2009 4:50 PM

## 2010-09-16 NOTE — Progress Notes (Signed)
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     Initial call taken by: Debbra Riding,  February 12, 2010 3:52 PM  Follow-up for Phone Call        efilled to [facility]. KIK Follow-up by: Duard Brady LPN,  February 13, 2010 7:42 AM    Prescriptions: OMEPRAZOLE 40 MG CPDR (OMEPRAZOLE) 1 once daily  #90 x 4   Entered by:   Duard Brady LPN   Authorized by:   Gordy Savers  MD   Signed by:   Duard Brady LPN on 83/15/1761   Method used:   Electronically to        [facility] [phone]* (retail)       [address]       [place], [place]  [address]       Ph: [phone]       Fax: [phone]   RxID:   [id number]

## 2010-09-16 NOTE — Progress Notes (Signed)
Summary: chest pain  Phone Note Call from Patient   Caller: Patient Call For: Gordy Savers  MD Summary of Call: Pt is calling with chest pain in the middle of her chest with radiation to arm and hand.  Advised ER ASAP. Initial call taken by: Lynann Beaver CMA,  August 20, 2009 3:49 PM  Follow-up for Phone Call        noted Follow-up by: Gordy Savers  MD,  August 20, 2009 4:03 PM

## 2010-09-16 NOTE — Letter (Signed)
Summary: [facility]  [facility]   Imported By: Sherian Rein 10/17/2009 13:43:30  _____________________________________________________________________  External Attachment:    Type:   Image     Comment:   External Document

## 2010-09-16 NOTE — Assessment & Plan Note (Signed)
Summary: F/U FROM ED VISIT // RS   Vital Signs:  Patient profile:   40 year old female Weight:      217 pounds Temp:     98.7 degrees F oral BP sitting:   138 / 90  (left arm) Cuff size:   regular  Vitals Entered By: Raechel Ache, RN (August 26, 2009 3:54 PM) CC: Hosp f/u; whole L side is throbbing.   CC:  Hosp f/u; whole L side is throbbing.Marland Kitchen  History of Present Illness: 40 year old patient who is seen today for follow-up.  She has a history of hypertension and C1q, nephropathy.  Since her last visit here, she has had a ETT the two atypical chest pain.  This revealed  poor exercise tolerance, a  hypertensive blood pressure response but no ischemic changes.Today, she feels well.  She has had laboratory studies done that revealed a total cholesterol of 211 but the LDL cholesterol of 152 and an HDL of 40  Allergies: 1)  ! Adult Aspirin Ec Low Strength (Aspirin)  Past History:  Past Medical History: pulmonary embolism, July 2008 C1Q nephropathy Hypertension Allergic rhinitis IBS Hyperlipidemia  Physical Exam  General:  Well-developed,well-nourished,in no acute distress; alert,appropriate and cooperative throughout examination Head:  Normocephalic and atraumatic without obvious abnormalities. No apparent alopecia or balding. Eyes:  No corneal or conjunctival inflammation noted. EOMI. Perrla. Funduscopic exam benign, without hemorrhages, exudates or papilledema. Vision grossly normal. Mouth:  Oral mucosa and oropharynx without lesions or exudates.  Teeth in good repair. Neck:  No deformities, masses, or tenderness noted. Lungs:  Normal respiratory effort, chest expands symmetrically. Lungs are clear to auscultation, no crackles or wheezes. Heart:  Normal rate and regular rhythm. S1 and S2 normal without gallop, murmur, click, rub or other extra sounds. Abdomen:  Bowel sounds positive,abdomen soft and non-tender without masses, organomegaly or hernias noted.   Impression &  Recommendations:  Problem # 1:  HYPERTENSION (ICD-401.9)  Her updated medication list for this problem includes:    Metoprolol Tartrate 50 Mg Tabs (Metoprolol tartrate) .Marland Kitchen... 1 two times a day    Amlodipine Besylate 5 Mg Tabs (Amlodipine besylate) ..... One daily    Benazepril Hcl 40 Mg Tabs (Benazepril hcl)    Losartan Potassium 100 Mg Tabs (Losartan potassium) .Marland Kitchen... 1 once daily  Her updated medication list for this problem includes:    Metoprolol Tartrate 50 Mg Tabs (Metoprolol tartrate) .Marland Kitchen... 1 two times a day    Amlodipine Besylate 5 Mg Tabs (Amlodipine besylate) ..... One daily    Benazepril Hcl 40 Mg Tabs (Benazepril hcl)    Losartan Potassium 100 Mg Tabs (Losartan potassium) .Marland Kitchen... 1 once daily  Problem # 2:  RENAL DISEASE (ICD-593.9)  Problem # 3:  HYPERLIPIDEMIA (ICD-272.4) will follow-up with fasting lipid profile in 6 months  Complete Medication List: 1)  Synthroid 137 Mcg Tabs (Levothyroxine sodium) .Marland Kitchen.. 1 once daily 2)  Claritin 10 Mg Tabs (Loratadine) .Marland Kitchen.. 1 once daily 3)  Alprazolam 0.5 Mg Tabs (Alprazolam) .... One every 8 hrs as needed 4)  Loratadine 10 Mg Tabs (Loratadine) .... One daily 5)  Promethazine Hcl 25 Mg Tabs (Promethazine hcl) .... One every  6 hrs for nausea 6)  Metoprolol Tartrate 50 Mg Tabs (Metoprolol tartrate) .Marland Kitchen.. 1 two times a day 7)  Amlodipine Besylate 5 Mg Tabs (Amlodipine besylate) .... One daily 8)  Benazepril Hcl 40 Mg Tabs (Benazepril hcl) 9)  Tramadol Hcl 50 Mg Tabs (Tramadol hcl) .... One tablet every 6 hours  as needed for pain 10)  Losartan Potassium 100 Mg Tabs (Losartan potassium) .Marland Kitchen.. 1 once daily 11)  Omeprazole 40 Mg Cpdr (Omeprazole) .Marland Kitchen.. 1 once daily  Patient Instructions: 1)  Please schedule a follow-up appointment in 6 months. 2)  Limit your Sodium (Salt). 3)  It is important that you exercise regularly at least 20 minutes 5 times a week. If you develop chest pain, have severe difficulty breathing, or feel very tired , stop  exercising immediately and seek medical attention. 4)  You need to lose weight. Consider a lower calorie diet and regular exercise.  5)  Check your Blood Pressure regularly. If it is above: 140/90 you should make an appointment. Prescriptions: OMEPRAZOLE 40 MG CPDR (OMEPRAZOLE) 1 once daily  #90 x 6   Entered and Authorized by:   Gordy Savers  MD   Signed by:   Gordy Savers  MD on 08/26/2009   Method used:   Print then Give to Patient   RxID:   [id number] JYNWGNFA POTASSIUM 100 MG TABS (LOSARTAN POTASSIUM) 1 once daily  #90 x 6   Entered and Authorized by:   Gordy Savers  MD   Signed by:   Gordy Savers  MD on 08/26/2009   Method used:   Print then Give to Patient   RxID:   [id number] BENAZEPRIL HCL 40 MG TABS (BENAZEPRIL HCL)   #90 x 5   Entered and Authorized by:   Gordy Savers  MD   Signed by:   Gordy Savers  MD on 08/26/2009   Method used:   Print then Give to Patient   RxID:   [id number] AMLODIPINE BESYLATE 5 MG TABS (AMLODIPINE BESYLATE) one daily  #90 x 0   Entered and Authorized by:   Gordy Savers  MD   Signed by:   Gordy Savers  MD on 08/26/2009   Method used:   Print then Give to Patient   RxID:   [id number] METOPROLOL TARTRATE 50 MG  TABS (METOPROLOL TARTRATE) 1 two times a day  #180 Each x 5   Entered and Authorized by:   Gordy Savers  MD   Signed by:   Gordy Savers  MD on 08/26/2009   Method used:   Print then Give to Patient   RxID:   [id number] LORATADINE 10 MG  TABS (LORATADINE) one daily  #90 x 6   Entered and Authorized by:   Gordy Savers  MD   Signed by:   Gordy Savers  MD on 08/26/2009   Method used:   Print then Give to Patient   RxID:   [id number] ALPRAZOLAM 0.5 MG  TABS (ALPRAZOLAM) one every 8 hrs as needed  #50 x 4   Entered and Authorized by:   Gordy Savers  MD   Signed by:   Gordy Savers  MD on 08/26/2009   Method used:    Print then Give to Patient   RxID:   [id number] SYNTHROID 137 MCG TABS (LEVOTHYROXINE SODIUM) 1 once daily  #90 Each x 5   Entered and Authorized by:   Gordy Savers  MD   Signed by:   Gordy Savers  MD on 08/26/2009   Method used:   Print then Give to Patient   RxID:   [id number]

## 2010-09-16 NOTE — Assessment & Plan Note (Signed)
Summary: back and leg pain//ccm   Vital Signs:  Patient profile:   40 year old female Temp:     99.0 degrees F oral BP sitting:   130 / 88  (left arm) Cuff size:   large  Vitals Entered By: Sid Falcon LPN (November 18, 1608 3:49 PM) CC: left leg and back pain Pain Assessment Patient in pain? yes     Location: hip Intensity: 7 Type: sharp   History of Present Illness: Acute visit lower lumbar back pain and left lower extremity radiculopathy symptoms. Onset of symptoms approximately one and half to 2 weeks ago. Went to [facility] urgent care last Thursday reported abdominal films, lumbar spine films, and urine assessment negative. Patient describes sharp pain radiates into the buttock and down left lower extremity to ankle. Somewhat intermittent. Worse with prolonged standing or sitting or lying. Intermittent tingling left lower extremity. Tramadol without relief. No incontinence. 7-8/10 severity at worse. Cannot take tylenol or aspirin due to intolerance.  No history of prior back surgery. Somewhat similar flareups in past. 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  Allergies: 1)  ! Adult Aspirin Ec Low Strength (Aspirin)  Past History:  Past Medical History: Last updated: 08/26/2009 pulmonary embolism, July 2008 C1Q nephropathy Hypertension Allergic rhinitis IBS Hyperlipidemia PMH reviewed for relevance  Review of Systems  The patient denies anorexia, fever, weight loss, abdominal pain, incontinence, and muscle weakness.    Physical Exam  General:  Well-developed,well-nourished,in no acute distress; alert,appropriate and cooperative throughout examination Lungs:  Normal respiratory effort, chest expands symmetrically. Lungs are clear to auscultation, no crackles or wheezes. Heart:  Normal rate and regular rhythm. S1 and S2 normal without gallop, murmur, click, rub or other extra sounds. Msk:  no palpated tenderness Extremities:  straight leg raise is  negative bilaterally Neurologic:  deep tendon reflexes 1+ lower extremities bilateralalert & oriented X3, cranial nerves II-XII intact, strength normal in all extremities, and sensation intact to light touch.     Impression & Recommendations:  Problem # 1:  BACK PAIN (ICD-724.5) ?nerve root impingement but nonfocal neuro exam.  Oxycodone for pain relief.  Consider short burst of prednisone if no better in 1-2 weeks. Her updated medication list for this problem includes:    Tramadol Hcl 50 Mg Tabs (Tramadol hcl) ..... One tablet every 6 hours as needed for pain    Oxycodone Hcl 5 Mg Tabs (Oxycodone hcl) .Marland Kitchen... 1-2 by mouth q 6 hours as needed pain  Complete Medication List: 1)  Synthroid 137 Mcg Tabs (Levothyroxine sodium) .Marland Kitchen.. 1 once daily 2)  Claritin 10 Mg Tabs (Loratadine) .Marland Kitchen.. 1 once daily 3)  Alprazolam 0.5 Mg Tabs (Alprazolam) .... One every 8 hrs as needed 4)  Loratadine 10 Mg Tabs (Loratadine) .... One daily 5)  Promethazine Hcl 25 Mg Tabs (Promethazine hcl) .... One every  6 hrs for nausea 6)  Metoprolol Tartrate 50 Mg Tabs (Metoprolol tartrate) .Marland Kitchen.. 1 two times a day 7)  Amlodipine Besylate 5 Mg Tabs (Amlodipine besylate) .... One daily 8)  Benazepril Hcl 40 Mg Tabs (Benazepril hcl) 9)  Tramadol Hcl 50 Mg Tabs (Tramadol hcl) .... One tablet every 6 hours as needed for pain 10)  Losartan Potassium 100 Mg Tabs (Losartan potassium) .Marland Kitchen.. 1 once daily 11)  Omeprazole 40 Mg Cpdr (Omeprazole) .Marland Kitchen.. 1 once daily 12)  Oxycodone Hcl 5 Mg Tabs (Oxycodone hcl) .Marland Kitchen.. 1-2 by mouth q 6 hours as needed pain  Patient Instructions: 1)  Most patients (90%) with low back pain will improve with time ( 2-6 weeks). Keep active but avoid activities that are painful. Apply moist heat and/or ice to lower back several times a day.  Prescriptions: OXYCODONE HCL 5 MG TABS (OXYCODONE HCL) 1-2 by mouth q 6 hours as needed pain  #40 x 0   Entered and Authorized by:   Evelena Peat MD   Signed by:   Evelena Peat MD on 11/18/2009   Method used:   Print then Give to Patient   RxID:   [id number]

## 2010-09-16 NOTE — Progress Notes (Signed)
Summary: Zovirax and Omeprazole  Phone Note Call from Patient   Summary of Call: [phone]  Requesting  Zovirax cream for genital herpes which was diagnosed 5 years ago.  This is the first outbreak in years. Needs Omeprazole 40 mg.  filled also. Nicolette Bang ([address])  States she never received the Omeprazole or a call back in June 2011. Initial call taken by: Lynann Beaver CMA,  March 18, 2010 4:05 PM  Follow-up for Phone Call        OK  generic zovirax cream   RF Omeprazole 40  #90  one daily  RF 2 Follow-up by: Gordy Savers  MD,  March 19, 2010 8:17 AM    New/Updated Medications: ZOVIRAX 5 % CREA (ACYCLOVIR) apply as directed OMEPRAZOLE 40 MG CPDR (OMEPRAZOLE) one by mouth daily OMEPRAZOLE 40 MG CPDR (OMEPRAZOLE) one by mouth daily Prescriptions: OMEPRAZOLE 40 MG CPDR (OMEPRAZOLE) one by mouth daily  #90 x 1   Entered by:   Lynann Beaver CMA   Authorized by:   Gordy Savers  MD   Signed by:   Lynann Beaver CMA on 03/19/2010   Method used:   Electronically to        [facility] [phone]* (retail)       [address]       [place], [place]  [address]       Ph: [phone]       Fax: [phone]   RxID:   [id number] OMEPRAZOLE 40 MG CPDR (OMEPRAZOLE) one by mouth daily  #90 x 0   Entered by:   Lynann Beaver CMA   Authorized by:   Gordy Savers  MD   Signed by:   Lynann Beaver CMA on 03/19/2010   Method used:   Electronically to        [facility] [phone]* (retail)       [address]       [place], [place]  [address]       Ph: [phone]       Fax: [phone]   RxID:   [id number] ZOVIRAX 5 % CREA (ACYCLOVIR) apply as directed  #1 tube x 1   Entered by:   Lynann Beaver CMA   Authorized by:   Gordy Savers  MD   Signed by:   Lynann Beaver CMA on 03/19/2010   Method used:   Electronically to        [facility] [phone]* (retail)       [address]       [place], [place]  [address]       Ph: [phone]       Fax:  [phone]   RxID:   [id number]  Notified pt.

## 2010-09-16 NOTE — Progress Notes (Signed)
Summary: yeast infection  Phone Note Call from Patient   Caller: Patient Call For: Rhonda Savers  MD Summary of Call: [phone] ([address]) Pt is having white, thick, itchy vaginal discharge and is asking for RX  Initial call taken by: Lynann Beaver CMA,  April 17, 2010 3:54 PM  Follow-up for Phone Call        diflucan 150  one time dose Follow-up by: Rhonda Savers  MD,  April 17, 2010 4:05 PM    New/Updated Medications: DIFLUCAN 100 MG TABS (FLUCONAZOLE) one by mouth now Prescriptions: DIFLUCAN 100 MG TABS (FLUCONAZOLE) one by mouth now  #1 x 0   Entered by:   Lynann Beaver CMA   Authorized by:   Rhonda Savers  MD   Signed by:   Lynann Beaver CMA on 04/17/2010   Method used:   Electronically to        [facility] [phone]* (retail)       [address]       [place], [place]  [address]       Ph: [phone]       Fax: [phone]   RxID:   [id number]  Notified pt.

## 2010-09-16 NOTE — Assessment & Plan Note (Signed)
Summary: flu shot/cjr  Nurse Visit   Allergies: 1)  ! Adult Aspirin Ec Low Strength (Aspirin)  Immunizations Administered:  Influenza Vaccine # 1:    Vaccine Type: Fluvax 3+    Site: left deltoid    Mfr: GlaxoSmithKline    Dose: 0.5 ml    Route: IM    Given by: Kathrynn Speed CMA    Exp. Date: 02/14/2011    Lot #: [id number]    VIS given: 03/11/10 version given May 20, 2010.  Flu Vaccine Consent Questions:    Do you have a history of severe allergic reactions to this vaccine? no    Any prior history of allergic reactions to egg and/or gelatin? no    Do you have a sensitivity to the preservative Thimersol? no    Do you have a past history of Guillan-Barre Syndrome? no    Do you currently have an acute febrile illness? no    Have you ever had a severe reaction to latex? no    Vaccine information given and explained to patient? yes    Are you currently pregnant? no  Orders Added: 1)  Flu Vaccine 84yrs + [90658] 2)  Admin 1st Vaccine [09811]

## 2010-09-18 NOTE — Assessment & Plan Note (Signed)
Summary: ?uti/njr   Vital Signs:  Patient profile:   40 year old female Weight:      215 pounds Temp:     99.5 degrees F oral BP sitting:   110 / 72  (right arm) Cuff size:   regular  Vitals Entered By: Duard Brady LPN (August 26, 2010 3:33 PM) CC: c/o ??uti - low back pain Is Patient Diabetic? No   CC:  c/o ??uti - low back pain.  History of Present Illness: 40 year old patient who has a history of hypertension.  She has hypothyroidism, history of pulmonary embolism, who presents with low-grade fever, lower abdominal pressure sensation.  The urinalysis was reviewed.  It did reveal pyuria.  She has had occasional UTIs in the past.  She also describes some low back pain that has felt largely resolved.  No  high fever, chills, or flank  Allergies: 1)  ! Adult Aspirin Ec Low Strength (Aspirin)  Past History:  Past Medical History: Reviewed history from 08/26/2009 and no changes required. pulmonary embolism, July 2008 C1Q nephropathy Hypertension Allergic rhinitis IBS Hyperlipidemia  Review of Systems       The patient complains of abdominal pain.  The patient denies anorexia, fever, weight loss, weight gain, vision loss, decreased hearing, hoarseness, chest pain, syncope, dyspnea on exertion, peripheral edema, prolonged cough, headaches, hemoptysis, melena, hematochezia, severe indigestion/heartburn, hematuria, incontinence, genital sores, muscle weakness, suspicious skin lesions, transient blindness, difficulty walking, depression, unusual weight change, abnormal bleeding, enlarged lymph nodes, angioedema, and breast masses.    Physical Exam  General:  overweight-appearing.   130/80 Head:  Normocephalic and atraumatic without obvious abnormalities. No apparent alopecia or balding. Eyes:  No corneal or conjunctival inflammation noted. EOMI. Perrla. Funduscopic exam benign, without hemorrhages, exudates or papilledema. Vision grossly normal. Ears:  External ear exam  shows no significant lesions or deformities.  Otoscopic examination reveals clear canals, tympanic membranes are intact bilaterally without bulging, retraction, inflammation or discharge. Hearing is grossly normal bilaterally. Mouth:  Oral mucosa and oropharynx without lesions or exudates.  Teeth in good repair. Neck:  No deformities, masses, or tenderness noted. Lungs:  Normal respiratory effort, chest expands symmetrically. Lungs are clear to auscultation, no crackles or wheezes. Heart:  Normal rate and regular rhythm. S1 and S2 normal without gallop, murmur, click, rub or other extra sounds. Abdomen:  Bowel sounds positive,abdomen soft and non-tender without masses, organomegaly or hernias noted. Msk:  No deformity or scoliosis noted of thoracic or lumbar spine.     Impression & Recommendations:  Problem # 1:  UTI (ICD-599.0)  Her updated medication list for this problem includes:    Ciprofloxacin Hcl 500 Mg Tabs (Ciprofloxacin hcl) ..... One twice daily  Problem # 2:  BACK PAIN (ICD-724.5)  Her updated medication list for this problem includes:    Tramadol Hcl 50 Mg Tabs (Tramadol hcl) ..... One tablet every 6 hours as needed for pain  Orders: UA Dipstick w/o Micro (manual) (04540)  Complete Medication List: 1)  Synthroid 137 Mcg Tabs (Levothyroxine sodium) .Marland Kitchen.. 1 once daily 2)  Alprazolam 0.5 Mg Tabs (Alprazolam) .... One every 8 hrs as needed 3)  Loratadine 10 Mg Tabs (Loratadine) .... One daily 4)  Promethazine Hcl 25 Mg Tabs (Promethazine hcl) .... One every  6 hrs for nausea 5)  Metoprolol Tartrate 50 Mg Tabs (Metoprolol tartrate) .Marland Kitchen.. 1 two times a day 6)  Amlodipine Besylate 5 Mg Tabs (Amlodipine besylate) .... One daily 7)  Benazepril Hcl 40 Mg Tabs (Benazepril  hcl) 8)  Tramadol Hcl 50 Mg Tabs (Tramadol hcl) .... One tablet every 6 hours as needed for pain 9)  Losartan Potassium 100 Mg Tabs (Losartan potassium) .Marland Kitchen.. 1 once daily 10)  Omeprazole 40 Mg Cpdr (Omeprazole)  .Marland Kitchen.. 1 once daily 11)  Zovirax 5 % Crea (Acyclovir) .... Apply as directed 12)  Diflucan 100 Mg Tabs (Fluconazole) .... One by mouth now 13)  Hydromet 5-1.5 Mg/53ml Syrp (Hydrocodone-homatropine) .... As directed 14)  Ciprofloxacin Hcl 500 Mg Tabs (Ciprofloxacin hcl) .... One twice daily  Patient Instructions: 1)  Please schedule a follow-up appointment in 4 months. 2)  Drink as much fluid as you can tolerate for the next few days. 3)  Limit your Sodium (Salt). 4)  It is important that you exercise regularly at least 20 minutes 5 times a week. If you develop chest pain, have severe difficulty breathing, or feel very tired , stop exercising immediately and seek medical attention. 5)  Take your antibiotic as prescribed until ALL of it is gone, but stop if you develop a rash or swelling and contact our office as soon as possible. Prescriptions: CIPROFLOXACIN HCL 500 MG TABS (CIPROFLOXACIN HCL) one twice daily  #20 x 0   Entered and Authorized by:   Gordy Savers  MD   Signed by:   Gordy Savers  MD on 08/26/2010   Method used:   Electronically to        [facility] [phone]* (retail)       [address]       [place], [place]  [address]       Ph: [phone]       Fax: [phone]   RxID:   [id number]    Orders Added: 1)  UA Dipstick w/o Micro (manual) [81002] 2)  Est. Patient Level III [24401]    Laboratory Results   Urine Tests  Date/Time Received: August 26, 2010 3:43 PM  Date/Time Reported: August 26, 2010 3:43 PM   Routine Urinalysis   Color: yellow Appearance: Cloudy Glucose: negative   (Normal Range: Negative) Bilirubin: negative   (Normal Range: Negative) Ketone: negative   (Normal Range: Negative) Spec. Gravity: 1.020   (Normal Range: 1.003-1.035) Blood: trace-intact   (Normal Range: Negative) pH: 6.0   (Normal Range: 5.0-8.0) Protein: >=300   (Normal Range: Negative) Urobilinogen: 0.2   (Normal Range: 0-1) Nitrite: positive   (Normal  Range: Negative) Leukocyte Esterace: moderate   (Normal Range: Negative)

## 2010-09-18 NOTE — Progress Notes (Signed)
Summary: sinus complaints  Phone Note Call from Patient   Caller: Patient Call For: Gordy Savers  MD Summary of Call: Complaining of cough, sore throat, and nasal congestion.  Needs RX to [facility]. [phone] Initial call taken by: Lynann Beaver CMA AAMA,  August 01, 2010 9:01 AM  Follow-up for Phone Call        generic hydromet one tsp  every 6 hrs for cough  6 oz Follow-up by: Gordy Savers  MD,  August 01, 2010 9:17 AM    New/Updated Medications: HYDROMET 5-1.5 MG/5ML SYRP (HYDROCODONE-HOMATROPINE) as directed Prescriptions: HYDROMET 5-1.5 MG/5ML SYRP (HYDROCODONE-HOMATROPINE) as directed  #6 oz x 0   Entered by:   Lynann Beaver CMA AAMA   Authorized by:   Gordy Savers  MD   Signed by:   Lynann Beaver CMA AAMA on 08/01/2010   Method used:   Telephoned to ...       [facility] [address] [phone]* (retail)       [address]       [place], [place]  [address]       Ph: [phone]       Fax: [phone]   RxID:   [id number]  Notified pt.

## 2010-10-10 ENCOUNTER — Telehealth: Payer: Self-pay | Admitting: Internal Medicine

## 2010-10-10 MED ORDER — LEVOTHYROXINE SODIUM 137 MCG PO TABS: 137.0000 ug | ORAL_TABLET | Freq: Every day | ORAL | Status: DC

## 2010-10-10 MED ORDER — METOPROLOL TARTRATE 50 MG PO TABS: 50.0000 mg | ORAL_TABLET | Freq: Two times a day (BID) | ORAL | Status: DC

## 2010-10-10 NOTE — Telephone Encounter (Signed)
Refills on med:  Metoprolol / Synthroid....  [facility] [address]..... Several faxes have been sent by pharmacy per pt, with no response.

## 2010-10-10 NOTE — Telephone Encounter (Signed)
Med request for rx to go to [facility] done

## 2010-10-30 LAB — POCT I-STAT, CHEM 8
Creatinine, Ser: 0.8 mg/dL (ref 0.4–1.2)
Glucose, Bld: 89 mg/dL (ref 70–99)
Hemoglobin: 13.9 g/dL (ref 12.0–15.0)
TCO2: 24 mmol/L (ref 0–100)

## 2010-10-30 LAB — CBC
Hemoglobin: 12.9 g/dL (ref 12.0–15.0)
MCH: 29.4 pg (ref 26.0–34.0)
MCHC: 34.2 g/dL (ref 30.0–36.0)
RDW: 14.1 % (ref 11.5–15.5)

## 2010-10-30 LAB — DIFFERENTIAL
Basophils Absolute: 0 10*3/uL (ref 0.0–0.1)
Basophils Relative: 1 % (ref 0–1)
Eosinophils Absolute: 0.3 10*3/uL (ref 0.0–0.7)
Monocytes Relative: 10 % (ref 3–12)
Neutrophils Relative %: 61 % (ref 43–77)

## 2010-10-30 LAB — D-DIMER, QUANTITATIVE: D-Dimer, Quant: 1.65 ug/mL-FEU — ABNORMAL HIGH (ref 0.00–0.48)

## 2010-10-30 LAB — POCT CARDIAC MARKERS: Myoglobin, poc: 34.2 ng/mL (ref 12–200)

## 2010-10-30 LAB — LIPASE, BLOOD: Lipase: 31 U/L (ref 11–59)

## 2010-11-02 LAB — CK TOTAL AND CKMB (NOT AT ARMC)
CK, MB: 1 ng/mL (ref 0.3–4.0)
CK, MB: 1.1 ng/mL (ref 0.3–4.0)
Relative Index: 1 (ref 0.0–2.5)
Relative Index: INVALID (ref 0.0–2.5)
Total CK: 107 U/L (ref 7–177)
Total CK: 91 U/L (ref 7–177)

## 2010-11-02 LAB — BASIC METABOLIC PANEL
BUN: 8 mg/dL (ref 6–23)
CO2: 24 mEq/L (ref 19–32)
Calcium: 8.9 mg/dL (ref 8.4–10.5)
Chloride: 103 mEq/L (ref 96–112)
Creatinine, Ser: 0.92 mg/dL (ref 0.4–1.2)
GFR calc Af Amer: >60 mL/min (ref >60)
GFR calc non Af Amer: >60 mL/min (ref >60)
Glucose, Bld: 99 mg/dL (ref 70–99)
Potassium: 3.9 mEq/L (ref 3.5–5.1)
Sodium: 133 mEq/L — ABNORMAL LOW (ref 135–145)

## 2010-11-02 LAB — POCT CARDIAC MARKERS: Troponin i, poc: <0.05 ng/mL (ref 0.00–0.09)

## 2010-11-02 LAB — CARDIAC PANEL(CRET KIN+CKTOT+MB+TROPI)
CK, MB: 0.6 ng/mL (ref 0.3–4.0)
CK, MB: 0.8 ng/mL (ref 0.3–4.0)
Relative Index: INVALID (ref 0.0–2.5)
Total CK: 77 U/L (ref 7–177)
Total CK: 86 U/L (ref 7–177)
Troponin I: 0.01 ng/mL (ref 0.00–0.06)

## 2010-11-02 LAB — POCT I-STAT, CHEM 8
Calcium, Ion: 1.17 mmol/L (ref 1.12–1.32)
Chloride: 107 mEq/L (ref 96–112)
HCT: 38 % (ref 36.0–46.0)
Hemoglobin: 12.9 g/dL (ref 12.0–15.0)
Potassium: 3.5 mEq/L (ref 3.5–5.1)

## 2010-11-02 LAB — DIFFERENTIAL
Basophils Absolute: 0 10*3/uL (ref 0.0–0.1)
Basophils Relative: 0 % (ref 0–1)
Eosinophils Absolute: 0.4 10*3/uL (ref 0.0–0.7)
Eosinophils Relative: 4 % (ref 0–5)
Lymphocytes Relative: 29 % (ref 12–46)
Lymphs Abs: 2.5 10*3/uL (ref 0.7–4.0)
Monocytes Absolute: 0.8 10*3/uL (ref 0.1–1.0)
Monocytes Relative: 10 % (ref 3–12)
Neutro Abs: 5 10*3/uL (ref 1.7–7.7)
Neutrophils Relative %: 57 % (ref 43–77)

## 2010-11-02 LAB — TROPONIN I
Troponin I: 0.02 ng/mL (ref 0.00–0.06)
Troponin I: <0.01 ng/mL (ref 0.00–0.06)

## 2010-11-02 LAB — CBC
HCT: 37.6 % (ref 36.0–46.0)
Hemoglobin: 12.8 g/dL (ref 12.0–15.0)
MCHC: 34.1 g/dL (ref 30.0–36.0)
MCV: 87.8 fL (ref 78.0–100.0)
Platelets: 275 10*3/uL (ref 150–400)
RBC: 4.28 MIL/uL (ref 3.87–5.11)
RDW: 13.9 % (ref 11.5–15.5)
WBC: 8.8 10*3/uL (ref 4.0–10.5)

## 2010-11-03 LAB — DIFFERENTIAL
Eosinophils Absolute: 0.3 10*3/uL (ref 0.0–0.7)
Eosinophils Relative: 4 % (ref 0–5)
Lymphocytes Relative: 29 % (ref 12–46)
Lymphs Abs: 2 10*3/uL (ref 0.7–4.0)
Monocytes Relative: 8 % (ref 3–12)

## 2010-11-03 LAB — CBC
HCT: 38.1 % (ref 36.0–46.0)
MCV: 87.5 fL (ref 78.0–100.0)
Platelets: 250 10*3/uL (ref 150–400)
RBC: 4.36 MIL/uL (ref 3.87–5.11)
WBC: 6.8 10*3/uL (ref 4.0–10.5)

## 2010-11-03 LAB — BASIC METABOLIC PANEL
Chloride: 107 mEq/L (ref 96–112)
GFR calc Af Amer: >60 mL/min (ref >60)
GFR calc non Af Amer: 53 mL/min — ABNORMAL LOW (ref >60)
Potassium: 4 mEq/L (ref 3.5–5.1)
Sodium: 138 mEq/L (ref 135–145)

## 2010-11-03 LAB — POCT CARDIAC MARKERS
CKMB, poc: <1 ng/mL — ABNORMAL LOW (ref 1.0–8.0)
CKMB, poc: <1 ng/mL — ABNORMAL LOW (ref 1.0–8.0)
Troponin i, poc: <0.05 ng/mL (ref 0.00–0.09)
Troponin i, poc: <0.05 ng/mL (ref 0.00–0.09)

## 2010-11-03 LAB — D-DIMER, QUANTITATIVE: D-Dimer, Quant: 1.69 ug/mL-FEU — ABNORMAL HIGH (ref 0.00–0.48)

## 2010-11-10 LAB — POCT URINALYSIS DIP (DEVICE)
Ketones, ur: NEGATIVE mg/dL
Protein, ur: 100 mg/dL — AB
Specific Gravity, Urine: 1.02 (ref 1.005–1.030)
Urobilinogen, UA: 0.2 mg/dL (ref 0.0–1.0)
pH: 5.5 (ref 5.0–8.0)

## 2010-11-14 ENCOUNTER — Encounter: Payer: Self-pay | Admitting: Internal Medicine

## 2010-11-17 LAB — COMPREHENSIVE METABOLIC PANEL
AST: 18 U/L (ref 0–37)
Albumin: 3.5 g/dL (ref 3.5–5.2)
BUN: 10 mg/dL (ref 6–23)
Calcium: 9.2 mg/dL (ref 8.4–10.5)
Creatinine, Ser: 1.16 mg/dL (ref 0.4–1.2)
GFR calc Af Amer: >60 mL/min (ref >60)
Total Protein: 7.8 g/dL (ref 6.0–8.3)

## 2010-11-17 LAB — D-DIMER, QUANTITATIVE: D-Dimer, Quant: 2.1 ug/mL-FEU — ABNORMAL HIGH (ref 0.00–0.48)

## 2010-11-17 LAB — DIFFERENTIAL
Basophils Absolute: 0.1 10*3/uL (ref 0.0–0.1)
Basophils Relative: 1 % (ref 0–1)
Eosinophils Relative: 4 % (ref 0–5)
Lymphocytes Relative: 29 % (ref 12–46)
Monocytes Relative: 7 % (ref 3–12)
Neutro Abs: 4.7 10*3/uL (ref 1.7–7.7)

## 2010-11-17 LAB — BASIC METABOLIC PANEL
CO2: 27 mEq/L (ref 19–32)
Calcium: 9.2 mg/dL (ref 8.4–10.5)
GFR calc Af Amer: >60 mL/min (ref >60)
GFR calc non Af Amer: >60 mL/min (ref >60)
Potassium: 3.8 mEq/L (ref 3.5–5.1)
Sodium: 138 mEq/L (ref 135–145)

## 2010-11-17 LAB — POCT CARDIAC MARKERS: CKMB, poc: 1.5 ng/mL (ref 1.0–8.0)

## 2010-11-17 LAB — CBC
HCT: 36 % (ref 36.0–46.0)
MCV: 87.8 fL (ref 78.0–100.0)
Platelets: 259 10*3/uL (ref 150–400)
RDW: 13.9 % (ref 11.5–15.5)

## 2010-11-17 LAB — LIPID PANEL
LDL Cholesterol: 152 mg/dL — ABNORMAL HIGH (ref 0–99)
VLDL: 19 mg/dL (ref 0–40)

## 2010-11-17 LAB — CARDIAC PANEL(CRET KIN+CKTOT+MB+TROPI)
CK, MB: 1 ng/mL (ref 0.3–4.0)
Relative Index: 0.9 (ref 0.0–2.5)
Total CK: 116 U/L (ref 7–177)

## 2010-11-17 LAB — CK TOTAL AND CKMB (NOT AT ARMC): Relative Index: 0.8 (ref 0.0–2.5)

## 2010-11-17 LAB — LIPASE, BLOOD: Lipase: 30 U/L (ref 11–59)

## 2010-11-23 LAB — URINALYSIS, ROUTINE W REFLEX MICROSCOPIC
Leukocytes, UA: NEGATIVE
Nitrite: NEGATIVE
Specific Gravity, Urine: 1.026 (ref 1.005–1.030)
pH: 6 (ref 5.0–8.0)

## 2010-11-23 LAB — URINE MICROSCOPIC-ADD ON

## 2010-11-23 LAB — CK TOTAL AND CKMB (NOT AT ARMC)
CK, MB: 0.9 ng/mL (ref 0.3–4.0)
Relative Index: 0.8 (ref 0.0–2.5)
Total CK: 119 U/L (ref 7–177)

## 2010-11-23 LAB — URINE CULTURE

## 2010-11-23 LAB — DIFFERENTIAL
Basophils Absolute: 0 10*3/uL (ref 0.0–0.1)
Lymphocytes Relative: 36 % (ref 12–46)
Monocytes Absolute: 0.5 10*3/uL (ref 0.1–1.0)
Neutro Abs: 4.3 10*3/uL (ref 1.7–7.7)

## 2010-11-23 LAB — COMPREHENSIVE METABOLIC PANEL
Albumin: 3.6 g/dL (ref 3.5–5.2)
BUN: 7 mg/dL (ref 6–23)
Chloride: 107 mEq/L (ref 96–112)
Creatinine, Ser: 0.94 mg/dL (ref 0.4–1.2)
Total Bilirubin: 0.4 mg/dL (ref 0.3–1.2)
Total Protein: 8 g/dL (ref 6.0–8.3)

## 2010-11-23 LAB — CBC
Platelets: 283 10*3/uL (ref 150–400)
RDW: 13.8 % (ref 11.5–15.5)

## 2010-12-04 ENCOUNTER — Ambulatory Visit (INDEPENDENT_AMBULATORY_CARE_PROVIDER_SITE_OTHER): Payer: BLUE CROSS/BLUE SHIELD | Admitting: Internal Medicine

## 2010-12-04 ENCOUNTER — Encounter: Payer: Self-pay | Admitting: Internal Medicine

## 2010-12-04 VITALS — BP 108/60 | HR 70 | Wt 213.0 lb

## 2010-12-04 DIAGNOSIS — N390 Urinary tract infection, site not specified: Secondary | ICD-10-CM

## 2010-12-04 LAB — POCT URINALYSIS DIPSTICK
Bilirubin, UA: NEGATIVE
Glucose, UA: NEGATIVE
Ketones, UA: NEGATIVE
Spec Grav, UA: 1.03
Urobilinogen, UA: 0.2

## 2010-12-07 ENCOUNTER — Encounter: Payer: Self-pay | Admitting: Internal Medicine

## 2010-12-07 LAB — URINE CULTURE: Colony Count: >=100000

## 2010-12-07 NOTE — Assessment & Plan Note (Signed)
Recommend antibiotic treatment. Patient requests suspension. Last culture reportedly demonstrating Cipro resistant. Was not placed on Septra brings up the possible question of resistance for the antibiotic as well. We'll attempt Augmentin suspensionx7 days. Obtain urine culture.Followup if no improvement or worsening.

## 2010-12-07 NOTE — Progress Notes (Signed)
  Subjective:    Patient ID: Rhonda [name], female    DOB: [birth year], 40 y.o.   MRN: [id number]  HPI Patient presents to clinic for evaluation of possible urinary tract infection. Meds one-month history of suprapubic pressure dark urine and nocturia. There is mild associated nausea and low back painwithout emesis fever chills or flank pain. States was treated for urinary tract infections in December and February both resolved. Outside physician treated the urinary tract infection in February the patient was told culture demonstrated resistance to Cipro. Was placed on ampicillin. Specifically states today cannot take pills and prefers suspension if possible. No alleviating or exacerbating factors.  Patient presents to clinic for evaluation of  Review of Systems see history of present illness     Objective:   Physical Exam  Nursing note and vitals reviewed. Constitutional: She appears well-developed and well-nourished.  HENT:  Head: Normocephalic and atraumatic.  Eyes: Conjunctivae are normal. No scleral icterus.  Abdominal: Soft. She exhibits no distension. There is no tenderness.  Neurological: She is alert.  Skin: Skin is warm and dry.          Assessment & Plan:

## 2010-12-16 ENCOUNTER — Other Ambulatory Visit: Payer: Self-pay | Admitting: Internal Medicine

## 2010-12-30 ENCOUNTER — Ambulatory Visit (INDEPENDENT_AMBULATORY_CARE_PROVIDER_SITE_OTHER): Payer: BLUE CROSS/BLUE SHIELD | Admitting: Internal Medicine

## 2010-12-30 ENCOUNTER — Encounter: Payer: Self-pay | Admitting: Internal Medicine

## 2010-12-30 DIAGNOSIS — J309 Allergic rhinitis, unspecified: Secondary | ICD-10-CM

## 2010-12-30 DIAGNOSIS — I10 Essential (primary) hypertension: Secondary | ICD-10-CM

## 2010-12-30 DIAGNOSIS — N289 Disorder of kidney and ureter, unspecified: Secondary | ICD-10-CM

## 2010-12-30 MED ORDER — ALPRAZOLAM 0.25 MG PO TABS: 0.2500 mg | ORAL_TABLET | Freq: Three times a day (TID) | ORAL | Status: DC | PRN

## 2010-12-30 NOTE — Progress Notes (Signed)
  Subjective:    Patient ID: Rhonda [name], female    DOB: 07-Apr-1971, 40 y.o.   MRN: [id number]  HPI11 year old patient who is seen today for followup of her hypertensionn .  She is to be seen by nephrology and their future for followup of her renal disease. She is doing quite well. Last week she had what sounds like an acute viral gastroenteritis with some nausea vomiting and diarrhea. These symptoms have largely resolved. She feels well today.     Review of Systems  Constitutional: Negative.   HENT: Negative for hearing loss, congestion, sore throat, rhinorrhea, dental problem, sinus pressure and tinnitus.   Eyes: Negative for pain, discharge and visual disturbance.  Respiratory: Negative for cough and shortness of breath.   Cardiovascular: Negative for chest pain, palpitations and leg swelling.  Gastrointestinal: Negative for nausea, vomiting, abdominal pain, diarrhea, constipation, blood in stool and abdominal distention.  Genitourinary: Negative for dysuria, urgency, frequency, hematuria, flank pain, vaginal bleeding, vaginal discharge, difficulty urinating, vaginal pain and pelvic pain.  Musculoskeletal: Negative for joint swelling, arthralgias and gait problem.  Skin: Negative for rash.  Neurological: Negative for dizziness, syncope, speech difficulty, weakness, numbness and headaches.  Hematological: Negative for adenopathy.  Psychiatric/Behavioral: Negative for behavioral problems, dysphoric mood and agitation. The patient is not nervous/anxious.        Objective:   Physical Exam  Constitutional: She is oriented to person, place, and time. She appears well-developed and well-nourished.       Blood pressure 110/78  HENT:  Head: Normocephalic.  Right Ear: External ear normal.  Left Ear: External ear normal.  Mouth/Throat: Oropharynx is clear and moist.  Eyes: Conjunctivae and EOM are normal. Pupils are equal, round, and reactive to light.  Neck: Normal range of motion. Neck  supple. No thyromegaly present.  Cardiovascular: Normal rate, regular rhythm, normal heart sounds and intact distal pulses.   Pulmonary/Chest: Effort normal and breath sounds normal.  Abdominal: Soft. Bowel sounds are normal. She exhibits no mass. There is no tenderness.  Musculoskeletal: Normal range of motion.  Lymphadenopathy:    She has no cervical adenopathy.  Neurological: She is alert and oriented to person, place, and time.  Skin: Skin is warm and dry. No rash noted.  Psychiatric: She has a normal mood and affect. Her behavior is normal.          Assessment & Plan:  Hypertension. Presently well controlled will follow up with renal medicine as scheduled  A low salt diet encouraged as well as exercise and modest weight loss

## 2010-12-30 NOTE — H&P (Signed)
Rhonda, [name]              ACCOUNT NO.:  [id number]   MEDICAL RECORD NO.:  [id number]          PATIENT TYPE:  INP   LOCATION:  1417                         FACILITY:  [facility]   PHYSICIAN:  Kela Millin, M.D.DATE OF BIRTH:  [birth year]   DATE OF ADMISSION:  03/09/2007  DATE OF DISCHARGE:                              HISTORY & PHYSICAL   PRIMARY CARE PHYSICIAN:  Dr. Amador Cunas.   CHIEF COMPLAINT:  Chest pain.   HISTORY OF PRESENT ILLNESS:  The patient is a 40 year old black female  with past medical history significant for C1Q nephropathy,  hypothyroidism (status post thyroidectomy with pathology report per  patient showing cancer and status post treatment per patient), GERD,  hypertension who presents with the above complaints. The patient reports  intermittent chest pain over a five-day period, and she states that  initially she thought that it was reflux, and also she was having some  left arm pain with left leg pain and some left upper thigh swelling for  the same duration, so she came to the ER for evaluation and management.  She denies shortness of breath, cough, fever, dysuria, diarrhea, melena,  headaches, slurred speech, dysphagia. No focal weakness.   The patient was seen in the ER, and a D-dimer was done, and it was  elevated at 1.85. She had point of care markers which were negative. A  CT angiogram was done, and it was consistent with pulmonary embolus, and  the patient is admitted for further evaluation and management.   PAST MEDICAL HISTORY:  As above.   PAST SURGICAL HISTORY:  Status post hysterectomy in 1998 and status post  cholecystectomy in 1997.   MEDICATIONS:  1. Lisinopril 20 mg p.o. daily.  2. Micardis/HCT 80/12.5 mg.  3. Synthroid 0.137 mg daily.  4. Prilosec 20 mg daily.  5. Claritin 10 mg daily.  6. Multivitamins.  7. Phenergan p.r.n.   ALLERGIES/INTOLERANCES:  ASPIRIN AND TYLENOL.   SOCIAL HISTORY:  She denies tobacco. Also denies  alcohol.   FAMILY HISTORY:  Her aunt has history of blood clots. Her father had  hypertension and diabetes.   REVIEW OF SYSTEMS:  As per HPI. Other review of systems negative.   PHYSICAL EXAMINATION:  In general, the patient is a young, black female  in no respiratory distress.  VITAL SIGNS:  Temperature is 99.1 with a blood pressure of 120/62 -  initially 96/69, pulse 84, respiratory rate 14, O2 saturation 98%.  HEENT:  PERRLA. EOMI. Moist mucous membranes.  LUNGS:  Clear to auscultation bilaterally. No crackles and no wheezes.  CARDIOVASCULAR:  Regular rate and rhythm. Normal S1 and S2.  ABDOMEN:  Soft, bowel sounds present, nontender, nondistended, no  organomegaly and no masses palpable.  EXTREMITIES:  No cyanosis. There is mild fullness/edema medially in her  left upper thigh area. Homan's sign negative.   LABORATORY DATA:  Her CT angiogram of chest - focal enlargement of  posterior right lower lobe pulmonary artery and right lung base with  central filling defect compatible with pulmonary embolus; otherwise no  acute findings. D-dimer 1.85. White cell count is 7.8,  hemoglobin 12.7,  hematocrit 36.8, platelet count 323. Sodium of 140 with a potassium of  3.6, chloride 105, CO2 27, glucose 124, BUN 14, creatinine 1.17, calcium  9.8. EKG shows normal sinus rhythm with nonspecific T wave abnormality.   ASSESSMENT AND PLAN:  1. Pulmonary embolus. As discussed above, will obtain hypercoagulable      panel. Will start on anticoagulation - noted patient has C1Q      nephropathy. She is also status post thyroid surgery, and per her      report, the pathology showed cancer at the time for which she      states she was treated. Will also obtain lower extremity Doppler      ultrasound as she complaining of left leg pain.  2. C1Q nephropathy - recently followed by Dr. Briant Cedar. Her      creatinine is within normal limits at 1.17.  3. Hypertension - continue lisinopril for now with hold  parameters,      blood pressure initially low as above. Will hold Micardis/HCT for      now, follow and resume when clinically indicated.  4. Hypothyroidism - continue Synthroid.  5. Gastroesophageal reflux disease - continue PPI.      Kela Millin, M.D.  Electronically Signed     ACV/MEDQ  D:  03/10/2007  T:  03/10/2007  Job:  [id number]   cc:   Gordy Savers, MD

## 2010-12-30 NOTE — Discharge Summary (Signed)
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   ADDITIONAL DIAGNOSES:  1. Anxiety disorder.  2. Status post pulmonary embolism.  3. Hypothyroidism.  4. Gastroesophageal reflux disease.  5. Mild anemia.   HISTORY OF THE PRESENT ILLNESS:  The patient is a 40 year old black  female with a remote history of pulmonary embolism in July of this year.  She was seen in the office 1 day prior to admission and due to dizziness  diuretic therapy was held.  She presented to the emergency department  via ambulance after recurrent syncopal episode in the shower.  The  patient is thus admitted for further evaluation and treatment of her  syncope.   HOSPITAL COURSE:  The patient was admitted to a telemetry setting where  her rhythm was monitored.  This was unremarkable.  Evaluation included a  head CT scan that was negative.  D-dimer was also negative.  Electrolytes were unremarkable, except for some mild anemia.  Hemoglobin  was 11.1.  Cardiac enzymes and EKG were unremarkable.  The patient  remained in a normal sinus rhythm on telemetry.   In the hospital she complained of anxiety.  All blood pressure  medications were held and at time of discharge the blood pressure was  130/82 without orthostatic changes.  The day prior the blood pressure  remained in the low normal range and O2 saturations remained normal.  It  should be noted that the patient has a history of CQ-10 nephropathy and  has been followed by nephrology.  Previously she had been on ACE  inhibition as well as an ARB mainly for renal  disease   DISPOSITION:  The patient was discharged today off all antihypertensive  medication.  Jhonnie Garner will recheck of blood pressure general status in the  office in a few days.   DISCHARGE MEDICATIONS:  The patient will be discharged on the following  regimen:  1. Coumadin 7.5 mg daily for 3 days and then she will resume a regimen      of 7.5 mg Monday, Wednesday and Friday, and 5.5 mg four times      weekly.  2. Prilosec 20 mg daily.  3. Synthroid 0.137 mg daily.  4. Claritin 10 mg daily.  5. Xanax 0.5 mg every 8 hours as needed.  6. In addition, she will be placed on Zoloft 50 mg every morning.   CONDITION ON DISCHARGE:  Stable.      Gordy Savers, MD  Electronically Signed     PFK/MEDQ  D:  05/28/2007  T:  05/29/2007  Job:  [id number]

## 2010-12-30 NOTE — Patient Instructions (Signed)
Limit your sodium (Salt) intake    It is important that you exercise regularly, at least 20 minutes 3 to 4 times per week.  If you develop chest pain or shortness of breath seek  medical attention.  You need to lose weight.  Consider a lower calorie diet and regular exercise.  Return in 4 months for follow-up 

## 2010-12-30 NOTE — Assessment & Plan Note (Signed)
[facility]                                 ON-CALL NOTE   NAME:COOPERShayle, Donahoo                       MRN:          [id number]  DATE:04/24/2007                            DOB:          29-Aug-1970    DATE OF INTERACTION:  April 24, 2007, at 1:16 a.m.   PHONE NUMBER:  [phone].   OBJECTIVE:  Patient has lightheadedness and cough.  Was in the hospital  the 23rd through the 25th of July for pulmonary embolism.  Since  yesterday is not feeling well.  Has chest pains and shortness of breath  and body aches and pains, not sure what is going on.   ASSESSMENT:  Unknown problems but could possibly be a pulmonary  embolism.   PLAN:  Go to the emergency room for evaluation.   PRIMARY CARE [place]ke:  Dr. Amador Cunas.   HOME OFFICE:  [facility].     Arta Silence, MD  Electronically Signed    RNS/MedQ  DD: 04/24/2007  DT: 04/24/2007  Job #: [id number]

## 2010-12-30 NOTE — Assessment & Plan Note (Signed)
[facility]                                 ON-CALL NOTE   Rhonda [name]                       MRN:          [id number]  DATE:06/04/2007                            DOB:          Jul 30, 1971    PRIMARY CARE PHYSICIAN:  Rhonda [name], M.D.   The patient is calling because her reflux esophagitis is bothering her.  She saw Dr. Amador [name] yesterday and he recommended a plan of  treatment.  It is not working.  She is able to swallow, but she has a  lot of burning.   REVIEW OF SYSTEMS:  Otherwise negative.   I advised clear liquid diet over the weekend.  Avoid all caffeinated  products.  Use Prevacid b.i.d.  See Dr. Amador [name] on Monday for  reevaluation.     Jeffrey A. Tawanna Cooler, MD  Electronically Signed    JAT/MedQ  DD: 06/04/2007  DT: 06/05/2007  Job #: [id number]

## 2010-12-30 NOTE — Discharge Summary (Signed)
Rhonda [name], Rhonda [name]              ACCOUNT NO.:  [id number]   MEDICAL RECORD NO.:  [id number]          PATIENT TYPE:  INP   LOCATION:  1417                         FACILITY:  [facility]   PHYSICIAN:  Valerie A. Felicity Coyer, MDDATE OF BIRTH:  [birth year]   DATE OF ADMISSION:  03/09/2007  DATE OF DISCHARGE:  03/15/2007                               DISCHARGE SUMMARY   DISCHARGE DIAGNOSIS:  1. Acute PE with generally unremarkable hypercoagulable panel status      post 5 days Lovenox with additional 24-hour dose given morning of      discharge.  Therapeutic INR today of 2.1.  Close outpatient follow-      up as described.  2. Hypothyroidism history.  3. 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  Outpatient follow-up with Dr.      Briant Cedar with med management as before.  Normal creatinine this      hospitalization to 0.98.  4. History of GERD.  5. History of seasonal allergies.   DISCHARGE MEDICATIONS:  1. Coumadin 10 mg p.o. q.p.m. or as directed per Dr. Kirtland Bouchard.  2. Neurontin 100 mg p.o. b.i.d. for left lower extremity pain, now      resolved (please note the patient was negative for DVT by Doppler).  3. Other Medications are as prior to admission without change and      include:      a.     Synthroid 137 mcg p.o. daily ,.      b.     Prilosec 40 mg daily.      c.     Claritin 10 mg daily.      d.     __________ HCTZ question dose one p.o. daily.      e.     Lisinopril 20 mg p.o. daily.   HOSPITAL FOLLOW-UP:  Scheduled closely for tomorrow, July 30, at Dr.  Nathen May office for an INR check only at 9:00 a.m.  If this  value is less than 2.0.  The patient will need another 24-hour dose of  overlapping Lovenox to be administered in the office prior to her  release from the office.  This dose of Lovenox, if necessary, would be  135 mcg subcu.  Again, to be given if INR less than 2.0.  Then, follow  up again 24 hours later, Thursday July 31 for INR check and office visit  with her primary care  physician Dr. Amador Cunas at 8:45 a.m. to further  establish plan for management and follow-up of her anticoagulation.  Other follow-up with her nephrologist, Dr. Briant Cedar, will be as  previously scheduled.   CONDITION ON DISCHARGE:  Hemodynamically stable.  No chest pain, no leg  pain.  No hypoxia and ready for discharge home.   HOSPITAL COURSE:  #1 - ACUTE PE.  The patient is a pleasant 40 year old  woman with minor chronic medical problems as listed above who presented  to the emergency room day of admission complaining of chest tightness.  Evaluation in the emergency room revealed the presence of a PE by CT  angio, and she was put thus referred for admission.  She was given  Flomax beginning that evening in the emergency room and continued q.12 h  at full dose during the course of this hospitalization.  A  hypercoagulable chest panel was checked and generally unremarkable for  any specific abnormality.  No further review per primary MD may be  warranted.  Coumadin overlapping titration was undertaken by pharmacy  protocol, and the patient's only complaint of discomfort was that in her  left lower extremity which she felt was tight.  However, venous Dopplers  failed to show any evidence of DVT despite the known evidence of PE.  She was thus symptomatically treated with pain medication, muscle  relaxants and even Neurontin for this discomfort as anticoagulation was  already being managed for PE.  She found the most relief with Neurontin  and thus will be continued on same.  After 5 days of inpatient full-dose  twice daily Lovenox, the patient's INR had just reached a therapeutic  value at 2.1, and an additional 24-hour overlap was recommended to  ensure two consecutive values of greater than 2.0.  As such, she has  been given a full dose of Lovenox and a 1.5 mg/kg dose on the morning of  discharge prior to her departure from the hospital.  Close outpatient  follow-up with an INR will  be in the morning where it is anticipated  that her INR will remain greater than 2.0.  However, she will also have  follow up the day after again for establishment with her primary MD to  continue to establish plans on long-term management over the next 6-12  months or however long the duration of treatment deems to be necessary  after further discussion with her primary MD.   #2 - OTHER MEDICAL ISSUES.  The patient's other medical issues are as  listed.  Her ARB and ACE inhibitor were held during the majority this  hospitalization likely due to soft blood pressures in the systolic range  of 100, exacerbated by narcotic use for her leg as mentioned during this  hospitalization.  However, on the day of discharge, she is normotensive  and reports she takes these medications for kidney protection due to  her history of nephropathy greater than any history of hypertension, and  thus I feel it is safe to resume these medications.  Again, close  outpatient follow-up for the above issues will be as listed.  No other  changes were made in her regimen.      Valerie A. Felicity Coyer, MD  Electronically Signed     VAL/MEDQ  D:  03/15/2007  T:  03/15/2007  Job:  [id number]

## 2011-01-02 NOTE — Op Note (Signed)
Rhonda [name], Rhonda [name]            ACCOUNT NO.:  [id number]   MEDICAL RECORD NO.:  [id number]          PATIENT TYPE:  AMB   LOCATION:  ENDO                         FACILITY:  [facility]   PHYSICIAN:  Petra Kuba, M.D.    DATE OF BIRTH:  25-Dec-1970   DATE OF PROCEDURE:  10/03/2004  DATE OF DISCHARGE:                                 OPERATIVE REPORT   PROCEDURE:  Colonoscopy.   INDICATIONS FOR PROCEDURE:  Abdominal pain, chronic constipation.   Consent was signed after risks, benefits, methods, and options were  thoroughly discussed in the office.   MEDICINES USED:  Demerol 90, Versed 9.   DESCRIPTION OF PROCEDURE:  Rectal inspection was pertinent for small  external hemorrhoids. Digital exam was negative. The pediatric video  adjustable colonoscope was inserted and easily advanced around the colon to  the cecum. This did not require any abdominal pressure or any position  changes. No abnormality was seen on insertion. The cecum was identified by  the appendiceal orifice and the ileocecal valve. In fact, the scope was  inserted a short ways into the terminal ileum which was normal. Photo  documentation was obtained. The scope was slowly withdrawn. The prep was  adequate.  No abnormalities were seen as we slowly withdrew back to the  rectum.  There was minimal liquid stool that required washing and  suctioning.  Once back in the rectum, anal rectal pullthrough and  retroflexion confirmed some small hemorrhoids. The scope was straightened  and readvanced a short ways up the left side of the colon, air was  suctioned, scope removed. The patient tolerated the procedure well. There  was no obvious or immediate complications.   ENDOSCOPIC DIAGNOSIS:  1.  Internal and external small hemorrhoids.  2.  Otherwise within normal limits to the terminal ileum.   PLAN:  Followup in three months or p.r.n. Repeat screening at age 89.  Consider Zelnorm next, possibly CT or ultrasound to continue  workup if  symptoms continue.      MEM/MEDQ  D:  10/03/2004  T:  10/03/2004  Job:  [id number]   cc:   Dyke Maes, M.D.  [address]  [place]  [place] [address]  Fax: [phone]

## 2011-01-02 NOTE — Op Note (Signed)
NAMEBOYD, LITAKER                        ACCOUNT NO.:  [id number]   MEDICAL RECORD NO.:  [id number]                   PATIENT TYPE:  AMB   LOCATION:  ENDO                                 FACILITY:  [facility]   PHYSICIAN:  Sharyn Dross., M.D.               DATE OF BIRTH:  June 08, 1971   DATE OF PROCEDURE:  02/16/2003  DATE OF DISCHARGE:                                 OPERATIVE REPORT   PREOPERATIVE DIAGNOSIS:  Dyspepsia not responding to proton pump inhibitors  or prokinetic medications.   POSTOPERATIVE DIAGNOSIS:  Grossly normal endoscopic examination.   PROCEDURE:  Esophagogastroduodenoscopy.   MEDICATIONS:  Demerol 50 mg IV, Versed 5 mg IV, over an approximately 10  minute period of time.   INSTRUMENT USED:  Olympus video guided endoscope.   ENDOSCOPIST:  Dortha Kern, M.D.   SUBJECTIVE:  [name] is a 40 year old black female who has been known  to me for awhile with symptoms of dyspepsia and dysphagia which had occurred  at times.  She has undergone several endoscopic examinations in the past  (the last one was probably 1-2 years ago) where the results of the studies  were negative.  The patient would return with clinical or recurrent symptoms  which would initially not respond to medications and thus require an  endoscopic examination.  She had been seen approximately two months ago with  dysphagia and some dyspepsia that was noted.  She had been on PPI medication  at that time.  She stated that this was not helping her effectively.  She  was started on a prokinetic agent and asked to return back approximately a  month ago.  When she returned, the symptoms did not change.  On the  prokinetic agent, there was not much relief of her symptoms as well as the  patient having recurrent dyspepsia noted.  She was placed on a double dose  PPI process during this time and advised to come back within a month.  She  subsequently called the office about three days ago stating  that her  symptoms have progressively worsened to the point that it was felt that  endoscopic examination to evaluate the patient may have evidence of erosive  esophagitis versus normal exam had to be considered.  She was, thus,  scheduled for the endoscopic examination to come in today.   OBJECTIVE:  She is a very pleasant female who appears to be in no acute  distress.  Her vital signs are stable.  HEENT examination is anicteric.  Her  neck was supple.  The lungs are clear to auscultation and percussion.  The  heart has a recovery room without heaves, thrills, murmurs, or gallops.  PMI  is in the fifth intercostal space.  The abdomen was soft with positive  tenderness to palpation in the epigastric and supraumbilical region at this  time.  There was no distention noted and no tympanic sounds  appreciated.  The discomfort was negative with rebound or referred tenderness that was  appreciated at this time.  The extremities showed no cyanosis, clubbing, and  edema.  The abdomen is generally symmetric.   IMPRESSION:  Dyspepsia, recurrent, not responding to present medical  therapy, rule out distal esophagitis with erosive process, rule out possible  Barrett's, and rule out a normal endoscopic examination.   RECOMMENDATIONS:  Based on her symptoms, I am presently going to schedule  her for an endoscopic examination to be performed.   INFORMED CONSENT:  The patient was advised of the procedure and the  indications for the procedure.  She has had it previously and is going to  undergo it a second time after the risks, benefits, and manifestations were  explained to her.   PREOPERATIVE PREPARATION:  The patient presented to [facility] where the IV sedating medication was started.  A monitor was placed on  the patient to monitor the patient's vital signs and oxygen saturation.  Nasal oxygen at approximately 2 liters per minute was used and after  adequate sedation was  started, the procedure was begun.   CLASSIFICATION:  The patient has an ASA classification of 2 as well as a  general classification of 2 at this time.   PROCEDURE NOTE:  The instrument was advanced with the patient lying in the  left lateral position via the direct technique without difficulty.  The  oropharyngeal and epiglottis appeared to be grossly within normal limits at  this time.  The vocal cords were not well visualized during this portion of  the examination.  The esophagus was normal without any evidence of acute  inflammation, ulcerations, hiatal herniae, or varices appreciated.  Photographs were taken of the distal esophageal region to evaluate if  evident of distal erosive esophagitis may be present; however, this was not  appreciated with this examination.   The gastric area showed a normal mucous lake without any evidence of acute  inflammation or ulcerations that were noted.  The antral area also appeared  to be within normal limits.  The pylorus was normal with good peristaltic  activity that was noted.  Upon advancing through the pyloric canal, the  duodenal bulb and second portion also appeared to be within normal limits.  The ampulla of Vater was normal.   The instrument was retracted back and there was no evidence of any residual  inflammatory changes noted in the areas that had just been evaluated and  traversed through.  Retroflex view of the cardia showed a normal mucous lake  with, again, no evidence of any inflammatory changes noted.  As the  instrument was retracted back, there appeared to be very mild focal  gastritis changes noted but this possibly may be secondary to reflux  symptoms present.  The Z-line appeared to be at approximately 39 cm.  The  instrument was retracted into the esophageal proper where, again, no  evidence of any inflammatory changes consistent with esophagitis were noted. There was decreased peristaltic activity with flushing of water  into the  esophageal region that was noted, also.  The instrument was retracted back  and removed per orum without difficulty with the patient tolerating the  procedure well.   TREATMENT:  1. Presently, I feel that this patient may have a component of irritable     bowel syndrome that is present on her.  I am going to start her with     Nulev and Levbid  to see how she responds to this.  The Nulev should be     given sublingual t.i.d. as needed for break through pain and the Levbid     on an every 12 hour basis.  2. She is to follow up with me in two weeks for re-evaluation.  3. No biopsies were taken during this procedure examination since the test     appeared to be normal throughout the entire endoscopic examination.                                               Sharyn Dross., M.D.    JM/MEDQ  D:  02/16/2003  T:  02/16/2003  Job:  [id number]

## 2011-02-09 ENCOUNTER — Encounter: Payer: Self-pay | Admitting: Gastroenterology

## 2011-02-09 ENCOUNTER — Ambulatory Visit (INDEPENDENT_AMBULATORY_CARE_PROVIDER_SITE_OTHER): Payer: BLUE CROSS/BLUE SHIELD | Admitting: Gastroenterology

## 2011-02-09 VITALS — BP 116/84 | HR 76 | Ht 66.0 in | Wt 217.4 lb

## 2011-02-09 DIAGNOSIS — K219 Gastro-esophageal reflux disease without esophagitis: Secondary | ICD-10-CM

## 2011-02-09 DIAGNOSIS — K625 Hemorrhage of anus and rectum: Secondary | ICD-10-CM

## 2011-02-09 DIAGNOSIS — K5900 Constipation, unspecified: Secondary | ICD-10-CM

## 2011-02-09 MED ORDER — DEXLANSOPRAZOLE 60 MG PO CPDR: DELAYED_RELEASE_CAPSULE | ORAL | Status: DC

## 2011-02-09 MED ORDER — PEG-KCL-NACL-NASULF-NA ASC-C 100 G PO SOLR: 1.0000 | Freq: Once | ORAL | Status: DC

## 2011-02-09 NOTE — Assessment & Plan Note (Signed)
This is probably functional. Plan colonoscopy to rule out any structural abnormalities. If negative then I would consider placing her in a constipation trial.

## 2011-02-09 NOTE — Progress Notes (Signed)
History of Present Illness:  [name] is a 40 year old African American female referred at the request of [facility] for evaluation of reflux symptoms and rectal bleeding. She's been suffering from reflux for years  despite taking up to twice a day omeprazole.  Symptoms are especially severe after bedtime where she has to sleep in a chair because of regurgitation of gastric contents and burning chest discomfort. She denies dysphagia. She is on no gastric irritants including nonsteroidals. Endoscopy in 2008 for evaluation of similar problems apparently was negative.  The patient also complains of constipation. She has a small bowel movement about every other day but has the sense of incomplete evacuation. She often sees drops of blood in the toilet tissue and coating the stools. With constipation she may have lower abdominal discomfort.   Review of Systems: Pertinent positive and negative review of systems were noted in the above HPI section. All other review of systems were otherwise negative.    Current Medications, Allergies, Past Medical History, Past Surgical History, Family History and Social History were reviewed in [employer] [facility] electronic medical record  Vital signs were reviewed in today's medical record. Physical Exam: General: Well developed , well nourished, no acute distress Head: Normocephalic and atraumatic Eyes:  sclerae anicteric, EOMI Ears: Normal auditory acuity Mouth: No deformity or lesions Lungs: Clear throughout to auscultation Heart: Regular rate and rhythm; no murmurs, rubs or bruits Abdomen: Soft, non tender and non distended. No masses, hepatosplenomegaly or hernias noted. Normal Bowel sounds; there is no succussion splash Rectal: There are no external lesions Musculoskeletal: Symmetrical with no gross deformities  Pulses:  Normal pulses noted Extremities: No clubbing, cyanosis, edema or deformities noted Neurological: Alert oriented x 4, grossly  nonfocal Psychological:  Alert and cooperative. Normal mood and affect

## 2011-02-09 NOTE — Patient Instructions (Addendum)
Gastroesophageal Reflux Disease (GERD) Your caregiver has diagnosed your chest discomfort as caused by gastroesophageal reflux disease (GERD). GERD is caused by a reflux of acid from your stomach into the digestive tube between your mouth and stomach (esophagus). Acid in contact with the esophagus causes soreness (inflammation) resulting in heartburn or chest pain. It may cause small holes in the lining of the esophagus (ulcers). CAUSES  Increased body weight puts pressure on the stomach, making acid rise.   Smoking increases acid production.   Alcohol decreases pressure on the valve between the stomach and esophagus (lower esophageal sphincter), allowing acid from the stomach into the esophagus.   Late evening meals and a full stomach increase pressure and acid production.   Lower esophageal sphincter is malformed.   Sometimes, no reason is found.  HOME CARE INSTRUCTIONS  Change the factors that you can control. Weight, smoking, or alcohol changes may be difficult to change on your own. Your caregiver can provide guidance and medical therapy.   Raising the head of your bed may help you to sleep.   Over-the-counter medicines will decrease acid production. Your caregiver can also prescribe medicines for this. Only take over-the-counter or prescription medicines for pain, discomfort, or fever as directed by your caregiver.   1/2 to 1 teaspoon of an antacid taken every hour while awake, with meals, and at bedtime, can neutralize acid.   DO NOT take aspirin, ibuprofen, or other nonsteroidal anti-inflammatory drugs (NSAIDs).  SEEK IMMEDIATE MEDICAL CARE IF:  The pain changes in location (radiates into arms, neck, jaw, teeth, or back), intensity, or duration.   You start feeling sick to your stomach (nauseous), start throwing up (vomiting), or sweating (diaphoresis).   You develop left arm or jaw pain.   You develop pain going into your back, shortness of breath, or you pass out.    There is vomiting of fluid that is green, yellow, or looks like coffee grounds or blood.  These symptoms could signal other problems, such as heart disease. MAKE SURE YOU:  Understand these instructions.   Will watch your condition.   Will get help right away if you are not doing well or get worse.  Document Released: 05/13/2005 Document Re-Released: 10/28/2009 [facility] Patient Information 2011 [place], [place]. Your Colonoscopy/Propoful is scheduled on 03/10/2011 at 2:30pm

## 2011-02-09 NOTE — Assessment & Plan Note (Addendum)
She remains symptomatic despite medical therapy.  Recommendations #1 trial of dexilant 60 mg before breakfast and dinner #2 gastric empty scan to rule out gastroparesis

## 2011-02-09 NOTE — Assessment & Plan Note (Signed)
Limited rectal bleeding is probably due to hemorrhoids. A more proximal colonic bleeding source should be ruled out.  Recommendations #1 colonoscopy

## 2011-02-10 ENCOUNTER — Encounter: Payer: Self-pay | Admitting: Gastroenterology

## 2011-02-11 ENCOUNTER — Telehealth: Payer: Self-pay | Admitting: *Deleted

## 2011-02-11 DIAGNOSIS — R140 Abdominal distension (gaseous): Secondary | ICD-10-CM

## 2011-02-11 NOTE — Telephone Encounter (Signed)
DR Arlyce Dice PT WANTS TO KNOW IF YOU WAS PLANNING ON SCHEDULING HER FOR A GASTRIC EMPTYING SCAN....SHE IS  SCHEDULED FOR A COLONOSCOPY NEXT MONTH, DO YOU WANT ME TO SCHEDULE HER FOR A GES??:

## 2011-02-12 ENCOUNTER — Telehealth: Payer: Self-pay | Admitting: *Deleted

## 2011-02-12 NOTE — Telephone Encounter (Signed)
Pt to pick up samples of dexilant today  Will schedule pt for a GES.... Pt scheduled for GES on 02/25/2011 at 1pm at [facility]

## 2011-02-12 NOTE — Telephone Encounter (Signed)
Typed letter for pt

## 2011-02-12 NOTE — Telephone Encounter (Signed)
Yes, please schedule GES

## 2011-02-25 ENCOUNTER — Other Ambulatory Visit (HOSPITAL_COMMUNITY): Payer: BLUE CROSS/BLUE SHIELD

## 2011-03-06 ENCOUNTER — Encounter (HOSPITAL_COMMUNITY)
Admission: RE | Admit: 2011-03-06 | Discharge: 2011-03-06 | Disposition: A | Discharge status: Home / Self Care | Payer: BLUE CROSS/BLUE SHIELD | Source: Ambulatory Visit | Attending: Gastroenterology | Admitting: Gastroenterology

## 2011-03-06 ENCOUNTER — Encounter (HOSPITAL_COMMUNITY): Payer: Self-pay

## 2011-03-06 DIAGNOSIS — R140 Abdominal distension (gaseous): Secondary | ICD-10-CM

## 2011-03-06 DIAGNOSIS — R141 Gas pain: Secondary | ICD-10-CM | POA: Insufficient documentation

## 2011-03-06 DIAGNOSIS — R109 Unspecified abdominal pain: Secondary | ICD-10-CM | POA: Insufficient documentation

## 2011-03-06 DIAGNOSIS — R142 Eructation: Secondary | ICD-10-CM | POA: Insufficient documentation

## 2011-03-06 DIAGNOSIS — R110 Nausea: Secondary | ICD-10-CM | POA: Insufficient documentation

## 2011-03-06 MED ORDER — TECHNETIUM TC 99M SULFUR COLLOID
2.0000 | Freq: Once | INTRAVENOUS | Status: AC | PRN
  Administered 2011-03-06: 2 via ORAL

## 2011-03-09 ENCOUNTER — Telehealth: Payer: Self-pay

## 2011-03-09 NOTE — Telephone Encounter (Signed)
Message copied by Michele Mcalpine on Mon Mar 09, 2011  8:56 AM ------      Message from: Melvia Heaps MD D      Created: Mon Mar 09, 2011  8:46 AM       Please inform the patient that GES was normal and to continue current plan of action

## 2011-03-09 NOTE — Progress Notes (Signed)
Quick Note:  Please inform the patient that GES was normal and to continue current plan of action ______ 

## 2011-03-09 NOTE — Telephone Encounter (Signed)
Spoke with pt and let her know GES result was normal per Dr. Arlyce Dice and to continue current plan of action.

## 2011-03-09 NOTE — Telephone Encounter (Signed)
                                                                                                                                                                                                                                                                                                                                                                                                            Spoke with pt and let her know results per Dr. Arlyce Dice.

## 2011-03-10 ENCOUNTER — Ambulatory Visit (AMBULATORY_SURGERY_CENTER): Payer: BLUE CROSS/BLUE SHIELD | Admitting: Gastroenterology

## 2011-03-10 ENCOUNTER — Encounter: Payer: Self-pay | Admitting: Gastroenterology

## 2011-03-10 VITALS — BP 145/101 | HR 81 | Temp 98.9°F | Resp 18 | Ht 66.5 in | Wt 214.0 lb

## 2011-03-10 DIAGNOSIS — D129 Benign neoplasm of anus and anal canal: Secondary | ICD-10-CM

## 2011-03-10 DIAGNOSIS — D126 Benign neoplasm of colon, unspecified: Secondary | ICD-10-CM

## 2011-03-10 DIAGNOSIS — K625 Hemorrhage of anus and rectum: Secondary | ICD-10-CM

## 2011-03-10 DIAGNOSIS — K599 Functional intestinal disorder, unspecified: Secondary | ICD-10-CM

## 2011-03-10 DIAGNOSIS — D128 Benign neoplasm of rectum: Secondary | ICD-10-CM

## 2011-03-10 MED ORDER — SODIUM CHLORIDE 0.9 % IV SOLN: 500.0000 mL | INTRAVENOUS | Status: DC

## 2011-03-10 NOTE — Patient Instructions (Signed)
Please refer to your blue and neon green sheets for instructions regarding diet and activity for the rest of today.  You may resume your medications as you would normally take them.  

## 2011-03-11 ENCOUNTER — Telehealth: Payer: Self-pay | Admitting: *Deleted

## 2011-03-11 NOTE — Telephone Encounter (Signed)
Follow up Call- Patient questions:  Do you have a fever, pain , or abdominal swelling? no Pain Score  0 *  Have you tolerated food without any problems? yes  Have you been able to return to your normal activities? yes  Do you have any questions about your discharge instructions: Diet    Medications  no Follow up visit  no  Do you have questions or concerns about your Care? no  Actions: * If pain score is 4 or above: No action needed, pain <4.

## 2011-03-12 ENCOUNTER — Telehealth: Payer: Self-pay

## 2011-03-12 NOTE — Telephone Encounter (Signed)
Left message for pt to all back. Scheduled egd with bravo ph monitor for 04/01/11@9 :15am @[facility] . Pt to arrive at 8am.

## 2011-03-12 NOTE — Telephone Encounter (Signed)
Message copied by Michele Mcalpine on Thu Mar 12, 2011 10:39 AM ------      Message from: Melvia Heaps MD D      Created: Tue Mar 10, 2011  2:49 PM       Please schedule her for EGD with bravo pH monitor while she continues her PPI

## 2011-03-12 NOTE — Telephone Encounter (Signed)
Spoke with pt and she is aware of appt date and time. Prep instructions mailed to the pt.

## 2011-04-01 ENCOUNTER — Encounter: Payer: BLUE CROSS/BLUE SHIELD | Admitting: Gastroenterology

## 2011-04-01 ENCOUNTER — Ambulatory Visit (HOSPITAL_COMMUNITY)
Admission: RE | Admit: 2011-04-01 | Discharge: 2011-04-01 | Disposition: A | Discharge status: Home / Self Care | Payer: BC Managed Care – PPO | Source: Ambulatory Visit | Attending: Gastroenterology | Admitting: Gastroenterology

## 2011-04-01 ENCOUNTER — Telehealth: Payer: Self-pay | Admitting: Gastroenterology

## 2011-04-01 DIAGNOSIS — K219 Gastro-esophageal reflux disease without esophagitis: Secondary | ICD-10-CM | POA: Insufficient documentation

## 2011-04-01 NOTE — Telephone Encounter (Signed)
On call note at 2000. Pt had EGD with Bravo probe placement today. EGD was normal. She has noted mild mid chest pain that comes and goes for the past few hours. Pt related that this pain was not present prior to the EGD today. Pain is more noticeable after eating solid foods. No severe pain, abd pain, fevers, chills, N/V.  Advised to remain on clear liquids tonight then advance to full liquids and then to her regular diet as tolerated. Pt has Ultram at home and is advised to take as prescribed for her pain. She is advised to call back or seek care in an ED if her symptoms worsen and she is advise to call Dr. Arlyce Dice in the morning if her symptoms do not fully resolve.

## 2011-04-07 ENCOUNTER — Other Ambulatory Visit: Payer: Self-pay | Admitting: Internal Medicine

## 2011-04-15 DIAGNOSIS — K219 Gastro-esophageal reflux disease without esophagitis: Secondary | ICD-10-CM

## 2011-04-17 ENCOUNTER — Other Ambulatory Visit: Payer: Self-pay | Admitting: Internal Medicine

## 2011-04-22 ENCOUNTER — Encounter: Payer: Self-pay | Admitting: Gastroenterology

## 2011-04-28 NOTE — Op Note (Signed)
  NAMELOUANNA, VANLIEW NO.:  [id number]  MEDICAL RECORD NO.:  [id number]  LOCATION:  [facility]                         FACILITY:  [facility]  PHYSICIAN:  Barbette Hair. Arlyce Dice, MD,FACGDATE OF BIRTH:  12/22/1970  DATE OF PROCEDURE:  04/01/2011 DATE OF DISCHARGE:  04/01/2011                              OPERATIVE REPORT   This is a 48-hour Bravo pH study on [employer].  48-hour pH study was done while the patient was on medications.  On day #1, there were 51 episodes of reflux, the longest lasting 15 minutes.  Total DeMeester score was 18.8 with normal less than 14.72.  On day #2, there were 19 episodes of reflux.  Total time of pH less than 4, was 16 minutes and the DeMeester score was 5.2.  No diary recordings were made by the patient.  Findings demonstrated moderate gastroesophageal reflux on day #1 and minimal, and significant episodes on day #2.  RECOMMENDATIONS:  Continue current medications.     Barbette Hair. Arlyce Dice, MD,FACG     RDK/MEDQ  D:  04/15/2011  T:  04/15/2011  Job:  [id number]  cc:   Gordy Savers, MD [address] [place] [address]  Electronically Signed by Melvia Heaps MDFACG on 04/28/2011 08:39:30 AM

## 2011-05-05 ENCOUNTER — Ambulatory Visit: Payer: BLUE CROSS/BLUE SHIELD | Admitting: Internal Medicine

## 2011-05-06 ENCOUNTER — Ambulatory Visit: Payer: BC Managed Care – PPO | Admitting: Gastroenterology

## 2011-05-25 LAB — DIFFERENTIAL
Basophils Absolute: 0
Eosinophils Relative: 4
Lymphocytes Relative: 30
Lymphs Abs: 1.9
Neutro Abs: 3.6
Neutrophils Relative %: 59

## 2011-05-25 LAB — COMPREHENSIVE METABOLIC PANEL
AST: 17
Albumin: 3.4 — ABNORMAL LOW
GFR calc non Af Amer: >60
Potassium: 3.8
Sodium: 138
Total Bilirubin: 0.6
Total Protein: 7.2

## 2011-05-25 LAB — CBC
Platelets: 239
RDW: 14.4

## 2011-05-25 LAB — D-DIMER, QUANTITATIVE: D-Dimer, Quant: 0.58 — ABNORMAL HIGH

## 2011-05-25 LAB — PROTIME-INR: Prothrombin Time: 16.9 — ABNORMAL HIGH

## 2011-05-25 LAB — POCT CARDIAC MARKERS
Myoglobin, poc: 57.6
Operator id: 4708

## 2011-05-27 LAB — PROTIME-INR: Prothrombin Time: 43.1 — ABNORMAL HIGH

## 2011-05-28 LAB — CBC
HCT: 31.5 — ABNORMAL LOW
HCT: 33.5 — ABNORMAL LOW
HCT: 36.9
Hemoglobin: 12.9
MCHC: 34.5
MCV: 84.3
MCV: 85.6
Platelets: 275
Platelets: 276
Platelets: 311
RBC: 3.77 — ABNORMAL LOW
RDW: 13.4
RDW: 13.6
RDW: 13.8
WBC: 6.3
WBC: 6.8
WBC: 8.1
WBC: 8.5

## 2011-05-28 LAB — COMPREHENSIVE METABOLIC PANEL
AST: 15
Albumin: 3.2 — ABNORMAL LOW
Alkaline Phosphatase: 67
BUN: 12
GFR calc Af Amer: >60
Potassium: 3.6
Sodium: 143
Total Protein: 6.9

## 2011-05-28 LAB — BASIC METABOLIC PANEL
BUN: 10
BUN: 3 — ABNORMAL LOW
BUN: 5 — ABNORMAL LOW
Chloride: 103
Creatinine, Ser: 1.07
GFR calc non Af Amer: 57 — ABNORMAL LOW
GFR calc non Af Amer: 59 — ABNORMAL LOW
GFR calc non Af Amer: >60
Glucose, Bld: 109 — ABNORMAL HIGH
Glucose, Bld: 110 — ABNORMAL HIGH
Potassium: 3 — ABNORMAL LOW
Potassium: 3.2 — ABNORMAL LOW
Potassium: 3.7
Sodium: 138

## 2011-05-28 LAB — DIFFERENTIAL
Basophils Relative: 0
Eosinophils Absolute: 0.1
Eosinophils Relative: 2
Eosinophils Relative: 3
Lymphocytes Relative: 17
Lymphocytes Relative: 23
Lymphs Abs: 1.4
Lymphs Abs: 1.6
Monocytes Absolute: 0.5
Monocytes Absolute: 0.7
Monocytes Relative: 8
Neutro Abs: 4.7
Neutro Abs: 5.8
Neutrophils Relative %: 68

## 2011-05-28 LAB — POCT CARDIAC MARKERS
CKMB, poc: <1 — ABNORMAL LOW
CKMB, poc: <1 — ABNORMAL LOW
Myoglobin, poc: 45.4
Myoglobin, poc: 50.4

## 2011-05-28 LAB — URINALYSIS, ROUTINE W REFLEX MICROSCOPIC
Nitrite: NEGATIVE
Protein, ur: NEGATIVE
Specific Gravity, Urine: 1.005
Urobilinogen, UA: 0.2

## 2011-05-28 LAB — CK TOTAL AND CKMB (NOT AT ARMC)
CK, MB: 0.9
Relative Index: INVALID
Relative Index: INVALID
Total CK: 82

## 2011-05-28 LAB — PROTIME-INR
INR: 1.4
INR: 1.5
INR: 2.6 — ABNORMAL HIGH
Prothrombin Time: 29.1 — ABNORMAL HIGH

## 2011-05-28 LAB — TROPONIN I: Troponin I: 0.02

## 2011-05-28 LAB — URINE MICROSCOPIC-ADD ON

## 2011-05-29 ENCOUNTER — Ambulatory Visit (INDEPENDENT_AMBULATORY_CARE_PROVIDER_SITE_OTHER): Payer: BC Managed Care – PPO

## 2011-05-29 DIAGNOSIS — Z23 Encounter for immunization: Secondary | ICD-10-CM

## 2011-05-29 LAB — CBC
HCT: 34.6 — ABNORMAL LOW
MCHC: 34.7
Platelets: 296
RDW: 13.8

## 2011-05-29 LAB — BASIC METABOLIC PANEL
BUN: 17
Calcium: 9.4
GFR calc non Af Amer: 56 — ABNORMAL LOW
Glucose, Bld: 105 — ABNORMAL HIGH

## 2011-05-29 LAB — DIFFERENTIAL
Basophils Absolute: 0
Basophils Relative: 1
Eosinophils Relative: 5
Lymphocytes Relative: 20

## 2011-05-29 LAB — D-DIMER, QUANTITATIVE: D-Dimer, Quant: 0.31

## 2011-06-01 LAB — BASIC METABOLIC PANEL
BUN: 15
BUN: 10
BUN: 14
CO2: 25
CO2: 26
CO2: 27
Calcium: 9.8
Calcium: 9.3
Chloride: 102
Chloride: 105
Creatinine, Ser: 1.16
Creatinine, Ser: 0.98
Creatinine, Ser: 1.17
GFR calc Af Amer: >60
Glucose, Bld: 108 — ABNORMAL HIGH
Glucose, Bld: 86
Potassium: 3.6

## 2011-06-01 LAB — POCT CARDIAC MARKERS
CKMB, poc: <1 — ABNORMAL LOW
Myoglobin, poc: 68.2
Troponin i, poc: <0.05

## 2011-06-01 LAB — CARDIOLIPIN ANTIBODIES, IGG, IGM, IGA
Anticardiolipin IgA: <7 — ABNORMAL LOW (ref <13)
Anticardiolipin IgM: 8 — ABNORMAL LOW (ref <10)

## 2011-06-01 LAB — CBC
HCT: 34.3 — ABNORMAL LOW
HCT: 34.9 — ABNORMAL LOW
HCT: 36.8
Hemoglobin: 11.9 — ABNORMAL LOW
Hemoglobin: 12.3
Hemoglobin: 12.7
MCHC: 35
MCHC: 34.6
MCHC: 34.7
MCHC: 35.2
MCV: 84.4
MCV: 84.5
MCV: 84.7
MCV: 85.1
Platelets: 311
Platelets: 299
Platelets: 320
Platelets: 323
RBC: 4.31
RBC: 4.04
RBC: 4.13
RBC: 4.35
RDW: 14
RDW: 14.1 — ABNORMAL HIGH
RDW: 14.3 — ABNORMAL HIGH
RDW: 14.3 — ABNORMAL HIGH
WBC: 7.8
WBC: 7.9
WBC: 8.2

## 2011-06-01 LAB — DIFFERENTIAL
Basophils Absolute: 0
Basophils Absolute: 0.1
Basophils Relative: 1
Basophils Relative: 1
Eosinophils Absolute: 0.2
Eosinophils Absolute: 0.3
Eosinophils Relative: 4
Lymphocytes Relative: 28
Lymphs Abs: 2.2
Monocytes Absolute: 0.5
Monocytes Relative: 7
Neutro Abs: 5
Neutro Abs: 4.7
Neutrophils Relative %: 68
Neutrophils Relative %: 60

## 2011-06-01 LAB — PROTIME-INR
INR: 2.3 — ABNORMAL HIGH
INR: 1
INR: 1.1
INR: 1.2
INR: 1.4
INR: 1.7 — ABNORMAL HIGH
INR: 1.9 — ABNORMAL HIGH
INR: 2.1 — ABNORMAL HIGH
Prothrombin Time: 26.3 — ABNORMAL HIGH
Prothrombin Time: 13.1
Prothrombin Time: 14.2
Prothrombin Time: 15.3 — ABNORMAL HIGH
Prothrombin Time: 17.4 — ABNORMAL HIGH
Prothrombin Time: 20.3 — ABNORMAL HIGH
Prothrombin Time: 22.7 — ABNORMAL HIGH
Prothrombin Time: 25 — ABNORMAL HIGH

## 2011-06-01 LAB — OCCULT BLOOD X 1 CARD TO LAB, STOOL
Fecal Occult Bld: NEGATIVE
Fecal Occult Bld: NEGATIVE
Fecal Occult Bld: POSITIVE

## 2011-06-01 LAB — PROTEIN S, TOTAL: Protein S Ag, Total: 154 % — ABNORMAL HIGH (ref 70–140)

## 2011-06-01 LAB — HEPATIC FUNCTION PANEL
ALT: 16
AST: 17
Bilirubin, Direct: <0.1
Total Protein: 8.2

## 2011-06-01 LAB — BASIC METABOLIC PANEL WITH GFR
Calcium: 9.8
GFR calc Af Amer: >60
GFR calc Af Amer: >60
GFR calc non Af Amer: 52 — ABNORMAL LOW
GFR calc non Af Amer: >60
Glucose, Bld: 124 — ABNORMAL HIGH
Potassium: 3.8
Sodium: 136
Sodium: 140

## 2011-06-01 LAB — PROTEIN C ACTIVITY: Protein C Activity: 195 % — ABNORMAL HIGH (ref 75–133)

## 2011-06-01 LAB — URINALYSIS, ROUTINE W REFLEX MICROSCOPIC
Bilirubin Urine: NEGATIVE
Ketones, ur: NEGATIVE
Nitrite: POSITIVE — AB
Protein, ur: NEGATIVE

## 2011-06-01 LAB — LUPUS ANTICOAGULANT PANEL
DRVVT: 48.4 — ABNORMAL HIGH (ref 36.1–47.0)
PTT Lupus Anticoagulant: 75.4 — ABNORMAL HIGH (ref 36.3–48.8)
PTTLA Confirmation: 0 (ref <8.0)

## 2011-06-01 LAB — D-DIMER, QUANTITATIVE: D-Dimer, Quant: 1.85 — ABNORMAL HIGH

## 2011-06-01 LAB — PROTHROMBIN GENE MUTATION

## 2011-06-01 LAB — APTT: aPTT: 35

## 2011-06-01 LAB — HOMOCYSTEINE: Homocysteine: 8.9

## 2011-06-01 LAB — PROTEIN C, TOTAL: Protein C, Total: 111 % (ref 70–140)

## 2011-06-01 LAB — URINE MICROSCOPIC-ADD ON

## 2011-06-07 ENCOUNTER — Emergency Department (HOSPITAL_COMMUNITY)
Admission: EM | Admit: 2011-06-07 | Discharge: 2011-06-07 | Disposition: A | Discharge status: Home / Self Care | Payer: BC Managed Care – PPO | Attending: Emergency Medicine | Admitting: Emergency Medicine

## 2011-06-07 ENCOUNTER — Emergency Department (HOSPITAL_COMMUNITY): Payer: BC Managed Care – PPO

## 2011-06-07 DIAGNOSIS — E039 Hypothyroidism, unspecified: Secondary | ICD-10-CM | POA: Insufficient documentation

## 2011-06-07 DIAGNOSIS — Z79899 Other long term (current) drug therapy: Secondary | ICD-10-CM | POA: Insufficient documentation

## 2011-06-07 DIAGNOSIS — G589 Mononeuropathy, unspecified: Secondary | ICD-10-CM | POA: Insufficient documentation

## 2011-06-07 DIAGNOSIS — Z86711 Personal history of pulmonary embolism: Secondary | ICD-10-CM | POA: Insufficient documentation

## 2011-06-07 DIAGNOSIS — I10 Essential (primary) hypertension: Secondary | ICD-10-CM | POA: Insufficient documentation

## 2011-06-07 DIAGNOSIS — K219 Gastro-esophageal reflux disease without esophagitis: Secondary | ICD-10-CM | POA: Insufficient documentation

## 2011-06-07 LAB — URINALYSIS, ROUTINE W REFLEX MICROSCOPIC
Hgb urine dipstick: NEGATIVE
Nitrite: POSITIVE — AB
Specific Gravity, Urine: 1.017 (ref 1.005–1.030)
Urobilinogen, UA: 0.2 mg/dL (ref 0.0–1.0)
pH: 6 (ref 5.0–8.0)

## 2011-06-07 LAB — DIFFERENTIAL
Basophils Absolute: 0 10*3/uL (ref 0.0–0.1)
Eosinophils Relative: 3 % (ref 0–5)
Lymphocytes Relative: 37 % (ref 12–46)
Neutrophils Relative %: 51 % (ref 43–77)

## 2011-06-07 LAB — POCT PREGNANCY, URINE: Preg Test, Ur: NEGATIVE

## 2011-06-07 LAB — CBC
HCT: 36.2 % (ref 36.0–46.0)
Platelets: 254 10*3/uL (ref 150–400)
RBC: 4.1 MIL/uL (ref 3.87–5.11)
RDW: 13.2 % (ref 11.5–15.5)
WBC: 8.2 10*3/uL (ref 4.0–10.5)

## 2011-06-07 LAB — URINE MICROSCOPIC-ADD ON

## 2011-06-07 LAB — BASIC METABOLIC PANEL
BUN: 11 mg/dL (ref 6–23)
Creatinine, Ser: 1.07 mg/dL (ref 0.50–1.10)
GFR calc Af Amer: 73 mL/min — ABNORMAL LOW (ref >90)
GFR calc non Af Amer: 63 mL/min — ABNORMAL LOW (ref >90)

## 2011-06-07 LAB — POCT I-STAT TROPONIN I

## 2011-06-07 MED ORDER — IOHEXOL 300 MG/ML  SOLN
100.0000 mL | Freq: Once | INTRAMUSCULAR | Status: AC | PRN
  Administered 2011-06-07: 100 mL via INTRAVENOUS

## 2011-06-23 ENCOUNTER — Emergency Department (INDEPENDENT_AMBULATORY_CARE_PROVIDER_SITE_OTHER)
Admission: EM | Admit: 2011-06-23 | Discharge: 2011-06-23 | Disposition: A | Discharge status: Home / Self Care | Payer: BC Managed Care – PPO | Source: Home / Self Care

## 2011-06-23 ENCOUNTER — Encounter (HOSPITAL_COMMUNITY): Payer: Self-pay | Admitting: *Deleted

## 2011-06-23 ENCOUNTER — Emergency Department (INDEPENDENT_AMBULATORY_CARE_PROVIDER_SITE_OTHER): Payer: BC Managed Care – PPO

## 2011-06-23 DIAGNOSIS — K5900 Constipation, unspecified: Secondary | ICD-10-CM

## 2011-06-23 DIAGNOSIS — B9689 Other specified bacterial agents as the cause of diseases classified elsewhere: Secondary | ICD-10-CM

## 2011-06-23 DIAGNOSIS — N390 Urinary tract infection, site not specified: Secondary | ICD-10-CM

## 2011-06-23 DIAGNOSIS — N760 Acute vaginitis: Secondary | ICD-10-CM

## 2011-06-23 DIAGNOSIS — A499 Bacterial infection, unspecified: Secondary | ICD-10-CM

## 2011-06-23 DIAGNOSIS — K5909 Other constipation: Secondary | ICD-10-CM

## 2011-06-23 HISTORY — DX: Other constipation: K59.09

## 2011-06-23 LAB — POCT URINALYSIS DIP (DEVICE)
Bilirubin Urine: NEGATIVE
Glucose, UA: NEGATIVE mg/dL
Ketones, ur: NEGATIVE mg/dL
Specific Gravity, Urine: 1.025 (ref 1.005–1.030)

## 2011-06-23 MED ORDER — METRONIDAZOLE 500 MG PO TABS: 500.0000 mg | ORAL_TABLET | Freq: Two times a day (BID) | ORAL | Status: AC

## 2011-06-23 MED ORDER — CEPHALEXIN 500 MG PO CAPS: 500.0000 mg | ORAL_CAPSULE | Freq: Two times a day (BID) | ORAL | Status: AC

## 2011-06-23 NOTE — ED Provider Notes (Signed)
History     CSN: [id number] Arrival date & time: 06/23/2011  9:17 AM   None     Chief Complaint  Patient presents with  . Vaginal Pain  . Back Pain  . Abdominal Pain    (Consider location/radiation/quality/duration/timing/severity/associated sxs/prior treatment) HPI Comments: Onset 3 days ago of vaginal discharge, lower back and abd pain. Abd pain is mild achey pain - began intermittent but has become more constant. Low back pain also mild, w/o radiation. No previous hx of back problems. No injury. Pt has a new sexual partner. Not using condoms. Hx of Hyst.   Patient is a 40 y.o. female presenting with back pain, abdominal pain, and vaginal discharge. The history is provided by the patient.  Back Pain  Associated symptoms include abdominal pain. Pertinent negatives include no chest pain, no fever and no dysuria.  Abdominal Pain The primary symptoms of the illness include abdominal pain, nausea (mild intermittent) and vaginal discharge. The primary symptoms of the illness do not include fever, shortness of breath, vomiting, diarrhea, dysuria or vaginal bleeding. The current episode started more than 2 days ago. The onset of the illness was sudden. The problem has not changed since onset. The vaginal discharge is not associated with dysuria.  The patient states that she believes she is currently not pregnant. The patient has not had a change in bowel habit (chronic constipation, last BM last night - hard stool). Additional symptoms associated with the illness include constipation and back pain. Symptoms associated with the illness do not include chills, urgency, hematuria or frequency. Significant associated medical issues include inflammatory bowel disease.  Vaginal Discharge This is a new problem. The current episode started more than 2 days ago. The problem occurs constantly. The problem has not changed since onset.Associated symptoms include abdominal pain. Pertinent negatives include no  chest pain and no shortness of breath. The symptoms are aggravated by nothing. The symptoms are relieved by nothing. She has tried nothing for the symptoms.    Past Medical History  Diagnosis Date  . Allergy   . Hypertension   . IBS (irritable bowel syndrome)   . Nephropathy   . Hyperlipemia   . Anxiety   . GERD (gastroesophageal reflux disease)   . Chronic constipation     Past Surgical History  Procedure Date  . Abdominal hysterectomy 1998  . Cholecystectomy 1997  . Esophagogastroduodenoscopy     Family History  Problem Relation Age of Onset  . Hypertension Father     History  Substance Use Topics  . Smoking status: Never Smoker   . Smokeless tobacco: Never Used  . Alcohol Use: No    OB History    Grav Para Term Preterm Abortions TAB SAB Ect Mult Living                  Review of Systems  Constitutional: Negative for fever and chills.  Respiratory: Negative for cough and shortness of breath.   Cardiovascular: Negative for chest pain and palpitations.  Gastrointestinal: Positive for nausea (mild intermittent), abdominal pain and constipation. Negative for vomiting, diarrhea, blood in stool and abdominal distention.  Genitourinary: Positive for vaginal discharge. Negative for dysuria, urgency, frequency, hematuria, vaginal bleeding and vaginal pain.  Musculoskeletal: Positive for back pain.    Allergies  Aspirin and Tylenol  Home Medications   Current Outpatient Rx  Name Route Sig Dispense Refill  . ALPRAZOLAM 0.25 MG PO TABS Oral Take 1 tablet (0.25 mg total) by mouth 3 (three) times  daily as needed for anxiety. 60 tablet 3  . AMLODIPINE BESYLATE 5 MG PO TABS  TAKE ONE TABLET BY MOUTH EVERY DAY 90 tablet 3  . BENAZEPRIL HCL 40 MG PO TABS Oral Take 40 mg by mouth daily.      Marland Kitchen DOCUSATE SODIUM 100 MG PO CAPS Oral Take 100 mg by mouth 2 (two) times daily.      Marland Kitchen LEVOTHYROXINE SODIUM 137 MCG PO TABS Oral Take 137 mcg by mouth daily. 90 tablet 2  . LORATADINE  10 MG PO TABS Oral Take 10 mg by mouth daily.      Marland Kitchen LOSARTAN POTASSIUM 100 MG PO TABS  TAKE ONE TABLET BY MOUTH EVERY DAY 90 tablet 3  . METOPROLOL TARTRATE 50 MG PO TABS Oral Take 1 tablet (50 mg total) by mouth 2 (two) times daily. 180 tablet 2  . ONE-DAILY MULTI VITAMINS PO TABS Oral Take 1 tablet by mouth daily.      Marland Kitchen OMEPRAZOLE 40 MG PO CPDR  TAKE ONE CAPSULE BY MOUTH EVERY DAY 90 capsule 3  . PROMETHAZINE HCL 25 MG PO TABS  TAKE ONE TABLET BY MOUTH EVERY 6 HOURS FOR NAUSEA 30 tablet 0  . TRAMADOL HCL 50 MG PO TABS Oral Take 50 mg by mouth every 6 (six) hours as needed.      Marland Kitchen ZOVIRAX 5 % EX CREA        BP 141/87  Pulse 69  Temp(Src) 98.3 F (36.8 C) (Oral)  Resp 16  SpO2 99%  Physical Exam  Nursing note and vitals reviewed. Constitutional: She appears well-developed and well-nourished. No distress.  HENT:  Head: Normocephalic and atraumatic.  Neck: Neck supple.  Cardiovascular: Normal rate, regular rhythm and normal heart sounds.   Pulmonary/Chest: Effort normal and breath sounds normal. No respiratory distress.  Abdominal: Soft. Bowel sounds are normal. She exhibits no distension and no mass. There is no hepatosplenomegaly. There is tenderness. There is no rebound and no guarding.  Genitourinary: Pelvic exam was performed with patient prone. There is no rash, tenderness or lesion on the right labia. There is no rash, tenderness or lesion on the left labia. Right adnexum displays no mass, no tenderness and no fullness. Left adnexum displays no mass, no tenderness and no fullness. No erythema or bleeding around the vagina. Vaginal discharge found.       Cervix and uterus surgically absent.  Neurological: She is alert.  Skin: Skin is warm and dry.  Psychiatric: She has a normal mood and affect.    ED Course  Procedures (including critical care time)   Labs Reviewed  GC/CHLAMYDIA PROBE AMP, GENITAL  WET PREP, GENITAL   No results found.   No diagnosis  found.    MDM  UA & Wet prep results reviewed. GC/Chlamydia pending.  KUB read by radiologist, results reviewed.         Melody Comas, [place] 06/23/11 1142

## 2011-06-23 NOTE — ED Notes (Signed)
Pt is here with complaints lower abdominal pain, vaginal pain, lower back pain, pressure when she voids, and vaginal itching X 3 days.  Pt reports history genital warts.

## 2011-06-24 NOTE — ED Provider Notes (Addendum)
History     CSN: [id number] Arrival date & time: 06/23/2011  9:17 AM   None     Chief Complaint  Patient presents with  . Vaginal Pain  . Back Pain  . Abdominal Pain    (Consider location/radiation/quality/duration/timing/severity/associated sxs/prior treatment) HPI  Past Medical History  Diagnosis Date  . Allergy   . Hypertension   . IBS (irritable bowel syndrome)   . Nephropathy   . Hyperlipemia   . Anxiety   . GERD (gastroesophageal reflux disease)   . Chronic constipation     Past Surgical History  Procedure Date  . Abdominal hysterectomy 1998  . Cholecystectomy 1997  . Esophagogastroduodenoscopy     Family History  Problem Relation Age of Onset  . Hypertension Father     History  Substance Use Topics  . Smoking status: Never Smoker   . Smokeless tobacco: Never Used  . Alcohol Use: No    OB History    Grav Para Term Preterm Abortions TAB SAB Ect Mult Living                  Review of Systems  Allergies  Aspirin and Tylenol  Home Medications   Current Outpatient Rx  Name Route Sig Dispense Refill  . ALPRAZOLAM 0.25 MG PO TABS Oral Take 1 tablet (0.25 mg total) by mouth 3 (three) times daily as needed for anxiety. 60 tablet 3  . AMLODIPINE BESYLATE 5 MG PO TABS  TAKE ONE TABLET BY MOUTH EVERY DAY 90 tablet 3  . BENAZEPRIL HCL 40 MG PO TABS Oral Take 40 mg by mouth daily.      . CEPHALEXIN 500 MG PO CAPS Oral Take 1 capsule (500 mg total) by mouth 2 (two) times daily. 14 capsule 0  . DOCUSATE SODIUM 100 MG PO CAPS Oral Take 100 mg by mouth 2 (two) times daily.      Marland Kitchen LEVOTHYROXINE SODIUM 137 MCG PO TABS Oral Take 137 mcg by mouth daily. 90 tablet 2  . LORATADINE 10 MG PO TABS Oral Take 10 mg by mouth daily.      Marland Kitchen LOSARTAN POTASSIUM 100 MG PO TABS  TAKE ONE TABLET BY MOUTH EVERY DAY 90 tablet 3  . METOPROLOL TARTRATE 50 MG PO TABS Oral Take 1 tablet (50 mg total) by mouth 2 (two) times daily. 180 tablet 2  . METRONIDAZOLE 500 MG PO TABS Oral  Take 1 tablet (500 mg total) by mouth 2 (two) times daily. 14 tablet 0  . ONE-DAILY MULTI VITAMINS PO TABS Oral Take 1 tablet by mouth daily.      Marland Kitchen OMEPRAZOLE 40 MG PO CPDR  TAKE ONE CAPSULE BY MOUTH EVERY DAY 90 capsule 3  . PROMETHAZINE HCL 25 MG PO TABS  TAKE ONE TABLET BY MOUTH EVERY 6 HOURS FOR NAUSEA 30 tablet 0  . TRAMADOL HCL 50 MG PO TABS Oral Take 50 mg by mouth every 6 (six) hours as needed.      Marland Kitchen ZOVIRAX 5 % EX CREA        BP 141/87  Pulse 69  Temp(Src) 98.3 F (36.8 C) (Oral)  Resp 16  SpO2 99%  Physical Exam  ED Course  Procedures (including critical care time)  Labs Reviewed  WET PREP, GENITAL - Abnormal; Notable for the following:    Clue Cells, Wet Prep MANY (*)    WBC, Wet Prep HPF POC FEW (*)    All other components within normal limits  POCT URINALYSIS DIP (  DEVICE) - Abnormal; Notable for the following:    Hgb urine dipstick TRACE (*)    Protein, ur >=300 (*)    Nitrite POSITIVE (*)    All other components within normal limits  GC/CHLAMYDIA PROBE AMP, GENITAL   Dg Abd 1 View  06/23/2011  *RADIOLOGY REPORT*  Clinical Data: 40 year old female with abdominal pain and constipation.  ABDOMEN - 1 VIEW  Comparison: 11/14/2009  Findings:  A small to moderate amount of stool in the right and transverse colon noted. No dilated bowel loops are present. No suspicious calcifications are identified. Cholecystectomy clips are present.  No acute bony abnormalities are noted.  IMPRESSION: No evidence of acute abnormality.  Small to moderate amount of stool in the right and transverse colon.  Original Report Authenticated By: Rosendo Gros, M.D.     1. UTI (urinary tract infection)   2. Bacterial vaginosis   3. Chronic constipation       MDM          Randa Spike, MD 06/24/11 1449  Randa Spike, MD 06/24/11 [phone]

## 2011-06-24 NOTE — ED Provider Notes (Signed)
Medical screening examination/treatment/procedure(s) were performed by non-physician practitioner and as supervising physician I was immediately available for consultation/collaboration.  Serenna Deroy G  D.O.    Randa Spike, MD 06/24/11 1455

## 2011-06-24 NOTE — ED Notes (Addendum)
11/7 Chart and labs reviewed. Pt. adequately treated with Metronidazole for bacterial vaginosis.

## 2011-07-01 ENCOUNTER — Telehealth: Payer: Self-pay | Admitting: *Deleted

## 2011-07-01 MED ORDER — FLUCONAZOLE 100 MG PO TABS: 150.0000 mg | ORAL_TABLET | ORAL | Status: DC

## 2011-07-01 NOTE — Telephone Encounter (Signed)
Pt has just finished a course of antibiotics,and is having a vaginal yeast infection.  Would like RX called to [facility]),

## 2011-07-01 NOTE — Telephone Encounter (Signed)
Diflucan 150 mg  #2 one dose; may repeat in one week if needed

## 2011-07-16 ENCOUNTER — Other Ambulatory Visit: Payer: Self-pay | Admitting: Internal Medicine

## 2011-07-31 ENCOUNTER — Other Ambulatory Visit: Payer: Self-pay | Admitting: Internal Medicine

## 2011-08-03 ENCOUNTER — Other Ambulatory Visit (HOSPITAL_COMMUNITY): Payer: Self-pay | Admitting: Family Medicine

## 2011-08-03 DIAGNOSIS — Z1231 Encounter for screening mammogram for malignant neoplasm of breast: Secondary | ICD-10-CM

## 2011-09-07 ENCOUNTER — Ambulatory Visit (HOSPITAL_COMMUNITY): Payer: Self-pay

## 2011-09-30 ENCOUNTER — Ambulatory Visit (HOSPITAL_COMMUNITY)
Admission: RE | Admit: 2011-09-30 | Discharge: 2011-09-30 | Disposition: A | Discharge status: Home / Self Care | Payer: Self-pay | Source: Ambulatory Visit | Attending: Family Medicine | Admitting: Family Medicine

## 2011-09-30 DIAGNOSIS — Z1231 Encounter for screening mammogram for malignant neoplasm of breast: Secondary | ICD-10-CM | POA: Insufficient documentation

## 2011-10-02 ENCOUNTER — Encounter (HOSPITAL_COMMUNITY): Payer: Self-pay | Admitting: Emergency Medicine

## 2011-10-02 ENCOUNTER — Emergency Department (HOSPITAL_COMMUNITY)
Admission: EM | Admit: 2011-10-02 | Discharge: 2011-10-02 | Disposition: A | Discharge status: Home / Self Care | Payer: Self-pay | Attending: Emergency Medicine | Admitting: Emergency Medicine

## 2011-10-02 DIAGNOSIS — K219 Gastro-esophageal reflux disease without esophagitis: Secondary | ICD-10-CM | POA: Insufficient documentation

## 2011-10-02 DIAGNOSIS — K589 Irritable bowel syndrome without diarrhea: Secondary | ICD-10-CM | POA: Insufficient documentation

## 2011-10-02 DIAGNOSIS — M549 Dorsalgia, unspecified: Secondary | ICD-10-CM | POA: Insufficient documentation

## 2011-10-02 DIAGNOSIS — I10 Essential (primary) hypertension: Secondary | ICD-10-CM | POA: Insufficient documentation

## 2011-10-02 DIAGNOSIS — IMO0002 Reserved for concepts with insufficient information to code with codable children: Secondary | ICD-10-CM | POA: Insufficient documentation

## 2011-10-02 DIAGNOSIS — N390 Urinary tract infection, site not specified: Secondary | ICD-10-CM | POA: Insufficient documentation

## 2011-10-02 DIAGNOSIS — IMO0001 Reserved for inherently not codable concepts without codable children: Secondary | ICD-10-CM | POA: Insufficient documentation

## 2011-10-02 DIAGNOSIS — X58XXXA Exposure to other specified factors, initial encounter: Secondary | ICD-10-CM | POA: Insufficient documentation

## 2011-10-02 DIAGNOSIS — E785 Hyperlipidemia, unspecified: Secondary | ICD-10-CM | POA: Insufficient documentation

## 2011-10-02 DIAGNOSIS — M62838 Other muscle spasm: Secondary | ICD-10-CM | POA: Insufficient documentation

## 2011-10-02 DIAGNOSIS — S76319A Strain of muscle, fascia and tendon of the posterior muscle group at thigh level, unspecified thigh, initial encounter: Secondary | ICD-10-CM

## 2011-10-02 DIAGNOSIS — M79609 Pain in unspecified limb: Secondary | ICD-10-CM | POA: Insufficient documentation

## 2011-10-02 DIAGNOSIS — Z79899 Other long term (current) drug therapy: Secondary | ICD-10-CM | POA: Insufficient documentation

## 2011-10-02 DIAGNOSIS — R269 Unspecified abnormalities of gait and mobility: Secondary | ICD-10-CM | POA: Insufficient documentation

## 2011-10-02 LAB — BASIC METABOLIC PANEL
BUN: 15 mg/dL (ref 6–23)
CO2: 28 mEq/L (ref 19–32)
GFR calc Af Amer: 75 mL/min — ABNORMAL LOW (ref >90)
GFR calc non Af Amer: 65 mL/min — ABNORMAL LOW (ref >90)
Glucose, Bld: 103 mg/dL — ABNORMAL HIGH (ref 70–99)
Sodium: 139 mEq/L (ref 135–145)

## 2011-10-02 LAB — URINE MICROSCOPIC-ADD ON

## 2011-10-02 LAB — CBC
MCH: 29.2 pg (ref 26.0–34.0)
MCV: 88.4 fL (ref 78.0–100.0)
Platelets: 271 10*3/uL (ref 150–400)
RBC: 4.48 MIL/uL (ref 3.87–5.11)
RDW: 13.2 % (ref 11.5–15.5)

## 2011-10-02 LAB — URINALYSIS, ROUTINE W REFLEX MICROSCOPIC
Bilirubin Urine: NEGATIVE
Ketones, ur: NEGATIVE mg/dL
Leukocytes, UA: NEGATIVE
Nitrite: NEGATIVE
Specific Gravity, Urine: 1.023 (ref 1.005–1.030)
Urobilinogen, UA: 1 mg/dL (ref 0.0–1.0)
pH: 6.5 (ref 5.0–8.0)

## 2011-10-02 LAB — DIFFERENTIAL
Basophils Absolute: 0 10*3/uL (ref 0.0–0.1)
Basophils Relative: 0 % (ref 0–1)
Eosinophils Absolute: 0.3 10*3/uL (ref 0.0–0.7)
Eosinophils Relative: 4 % (ref 0–5)
Lymphs Abs: 2.8 10*3/uL (ref 0.7–4.0)
Neutrophils Relative %: 51 % (ref 43–77)

## 2011-10-02 MED ORDER — TRAMADOL HCL 50 MG PO TABS: 50.0000 mg | ORAL_TABLET | Freq: Once | ORAL | Status: DC

## 2011-10-02 MED ORDER — METHOCARBAMOL 500 MG PO TABS: 500.0000 mg | ORAL_TABLET | Freq: Two times a day (BID) | ORAL | Status: AC

## 2011-10-02 MED ORDER — SULFAMETHOXAZOLE-TRIMETHOPRIM 800-160 MG PO TABS: 1.0000 | ORAL_TABLET | Freq: Two times a day (BID) | ORAL | Status: AC

## 2011-10-02 MED ORDER — OXYCODONE HCL 5 MG PO TABS: 5.0000 mg | ORAL_TABLET | ORAL | Status: AC | PRN

## 2011-10-02 MED ORDER — METHOCARBAMOL 500 MG PO TABS
1000.0000 mg | ORAL_TABLET | Freq: Once | ORAL | Status: AC
  Administered 2011-10-02: 1000 mg via ORAL
  Filled 2011-10-02: qty 2

## 2011-10-02 NOTE — ED Notes (Signed)
Pt c/o left leg pain from hip to foot.  Pt states it feels like "there is a catch" in her leg.  Pt able to ambulate, pain eases off when walking but gets worse after she takes weight off of it.

## 2011-10-02 NOTE — ED Notes (Signed)
Family at bedside. 

## 2011-10-02 NOTE — ED Notes (Signed)
Vital signs stable. 

## 2011-10-02 NOTE — ED Notes (Signed)
Patient is resting comfortably. 

## 2011-10-02 NOTE — ED Provider Notes (Signed)
History     CSN: [id number]  Arrival date & time 10/02/11  0701   First MD Initiated Contact with Patient 10/02/11 [phone]      Chief Complaint  Patient presents with  . Leg Pain    left    (Consider location/radiation/quality/duration/timing/severity/associated sxs/prior treatment) Patient is a 41 y.o. female presenting with leg pain. The history is provided by the patient.  Leg Pain  The incident occurred more than 2 days ago. There was no injury mechanism. The pain is present in the left leg. The pain is moderate. The pain has been constant since onset. Pertinent negatives include no numbness, no muscle weakness, no loss of sensation and no tingling. The symptoms are aggravated by activity.   Pt states no inciting incident. C/o tightness and pain down back of L thigh. No electric pain. No numbness or weakness. No joint pain. No fever/chills. No urinary symptoms. She does c/o mild L low back pain.   Past Medical History  Diagnosis Date  . Allergy   . Hypertension   . IBS (irritable bowel syndrome)   . Nephropathy   . Hyperlipemia   . Anxiety   . GERD (gastroesophageal reflux disease)   . Chronic constipation     Past Surgical History  Procedure Date  . Abdominal hysterectomy 1998  . Cholecystectomy 1997  . Esophagogastroduodenoscopy     Family History  Problem Relation Age of Onset  . Hypertension Father     History  Substance Use Topics  . Smoking status: Never Smoker   . Smokeless tobacco: Never Used  . Alcohol Use: No    OB History    Grav Para Term Preterm Abortions TAB SAB Ect Mult Living                  Review of Systems  Constitutional: Negative for fever and chills.  Gastrointestinal: Negative for nausea, vomiting, abdominal pain and diarrhea.  Genitourinary: Negative for dysuria and hematuria.  Musculoskeletal: Positive for myalgias, back pain and gait problem. Negative for joint swelling and arthralgias.  Skin: Negative for color change,  pallor, rash and wound.  Neurological: Negative for tingling, weakness and numbness.    Allergies  Aspirin; Food; Tramadol; and Tylenol  Home Medications   Current Outpatient Rx  Name Route Sig Dispense Refill  . AMLODIPINE BESYLATE 5 MG PO TABS  TAKE ONE TABLET BY MOUTH EVERY DAY 90 tablet 3  . BENAZEPRIL HCL 40 MG PO TABS Oral Take 40 mg by mouth daily.      Marland Kitchen DOCUSATE SODIUM 100 MG PO CAPS Oral Take 100 mg by mouth daily.     Marland Kitchen LEVOTHYROXINE SODIUM 137 MCG PO TABS  TAKE ONE TABLET BY MOUTH EVERY DAY 90 tablet 0    Will need to be seen in Jan for future refills  . LORATADINE 10 MG PO TABS Oral Take 10 mg by mouth daily.      Marland Kitchen LOSARTAN POTASSIUM 100 MG PO TABS  TAKE ONE TABLET BY MOUTH EVERY DAY 90 tablet 3  . METOPROLOL TARTRATE 50 MG PO TABS  TAKE ONE TABLET BY MOUTH TWICE DAILY 180 tablet 0    Will need to be seen in Jan for future refills  . ONE-DAILY MULTI VITAMINS PO TABS Oral Take 1 tablet by mouth daily.      Marland Kitchen OMEPRAZOLE 40 MG PO CPDR  TAKE ONE CAPSULE BY MOUTH EVERY DAY 90 capsule 3  . PROMETHAZINE HCL 25 MG PO TABS  TAKE ONE  TABLET BY MOUTH EVERY 6 HOURS AS NEEDED FOR NAUSEA 15 tablet 0  . METHOCARBAMOL 500 MG PO TABS Oral Take 1 tablet (500 mg total) by mouth 2 (two) times daily. 20 tablet 0  . OXYCODONE HCL 5 MG PO TABS Oral Take 1 tablet (5 mg total) by mouth every 4 (four) hours as needed for pain. 15 tablet 0  . SULFAMETHOXAZOLE-TRIMETHOPRIM 800-160 MG PO TABS Oral Take 1 tablet by mouth 2 (two) times daily. 14 tablet 0    BP 133/96  Pulse 64  Temp(Src) 99 F (37.2 C) (Oral)  Resp 18  Ht 5\' 6"  (1.676 m)  Wt 212 lb (96.163 kg)  BMI 34.22 kg/m2  SpO2 99%  Physical Exam  Nursing note and vitals reviewed. Constitutional: She is oriented to person, place, and time. She appears well-developed and well-nourished. No distress.  HENT:  Head: Normocephalic and atraumatic.  Mouth/Throat: Oropharynx is clear and moist.  Eyes: EOM are normal. Pupils are equal, round,  and reactive to light.  Neck: Normal range of motion. Neck supple.  Cardiovascular: Normal rate and regular rhythm.   Pulmonary/Chest: Effort normal and breath sounds normal. No respiratory distress. She has no wheezes. She has no rales.  Abdominal: Soft. Bowel sounds are normal. She exhibits no distension. There is no tenderness. There is no rebound and no guarding.  Musculoskeletal: Normal range of motion. She exhibits tenderness (Tenderness and spasm noted L hamstring and L paraspinal lumbar area. 2+ DP pulses. No calf tenderness). She exhibits no edema.  Neurological: She is alert and oriented to person, place, and time.       5/5 motor, sensation intact  Skin: Skin is warm and dry. No rash noted. No erythema.  Psychiatric: She has a normal mood and affect. Her behavior is normal.    ED Course  Procedures (including critical care time)  Labs Reviewed  BASIC METABOLIC PANEL - Abnormal; Notable for the following:    Glucose, Bld 103 (*)    GFR calc non Af Amer 65 (*)    GFR calc Af Amer 75 (*)    All other components within normal limits  URINALYSIS, ROUTINE W REFLEX MICROSCOPIC - Abnormal; Notable for the following:    APPearance CLOUDY (*)    Protein, ur 100 (*)    All other components within normal limits  URINE MICROSCOPIC-ADD ON - Abnormal; Notable for the following:    Squamous Epithelial / LPF FEW (*)    Bacteria, UA MANY (*)    All other components within normal limits  PREGNANCY, URINE  CBC  DIFFERENTIAL   Mm Digital Screening  10/01/2011  DG SCREEN MAMMOGRAM BILATERAL Bilateral CC and MLO view(s) were taken.  DIGITAL SCREENING MAMMOGRAM WITH CAD: The breast tissue is heterogeneously dense.  No masses or malignant type calcifications are  identified.  Compared with prior studies.  Images were processed with CAD.  IMPRESSION: No specific mammographic evidence of malignancy.  Next screening mammogram is recommended in one  year.  A result letter of this screening mammogram  will be mailed directly to the patient.  ASSESSMENT: Negative - BI-RADS 1  Screening mammogram in 1 year. ,     1. Hamstring strain   2. UTI (lower urinary tract infection)       MDM    Pt states pain is mildly improved. F/u with pcp      Loren Racer, MD 10/02/11 [phone]

## 2011-10-02 NOTE — ED Notes (Signed)
MD at bedside. 

## 2011-10-23 ENCOUNTER — Other Ambulatory Visit: Payer: Self-pay | Admitting: Internal Medicine

## 2011-10-27 ENCOUNTER — Emergency Department (HOSPITAL_COMMUNITY): Payer: Self-pay

## 2011-10-27 ENCOUNTER — Other Ambulatory Visit: Payer: Self-pay

## 2011-10-27 ENCOUNTER — Encounter (HOSPITAL_COMMUNITY): Payer: Self-pay | Admitting: *Deleted

## 2011-10-27 ENCOUNTER — Emergency Department (HOSPITAL_COMMUNITY)
Admission: EM | Admit: 2011-10-27 | Discharge: 2011-10-28 | Disposition: A | Discharge status: Home / Self Care | Payer: Self-pay | Attending: Emergency Medicine | Admitting: Emergency Medicine

## 2011-10-27 DIAGNOSIS — Z86711 Personal history of pulmonary embolism: Secondary | ICD-10-CM | POA: Insufficient documentation

## 2011-10-27 DIAGNOSIS — R55 Syncope and collapse: Secondary | ICD-10-CM | POA: Insufficient documentation

## 2011-10-27 DIAGNOSIS — R404 Transient alteration of awareness: Secondary | ICD-10-CM | POA: Insufficient documentation

## 2011-10-27 DIAGNOSIS — R0602 Shortness of breath: Secondary | ICD-10-CM | POA: Insufficient documentation

## 2011-10-27 DIAGNOSIS — E785 Hyperlipidemia, unspecified: Secondary | ICD-10-CM | POA: Insufficient documentation

## 2011-10-27 DIAGNOSIS — R05 Cough: Secondary | ICD-10-CM | POA: Insufficient documentation

## 2011-10-27 DIAGNOSIS — F411 Generalized anxiety disorder: Secondary | ICD-10-CM | POA: Insufficient documentation

## 2011-10-27 DIAGNOSIS — R42 Dizziness and giddiness: Secondary | ICD-10-CM | POA: Insufficient documentation

## 2011-10-27 DIAGNOSIS — K219 Gastro-esophageal reflux disease without esophagitis: Secondary | ICD-10-CM | POA: Insufficient documentation

## 2011-10-27 DIAGNOSIS — R059 Cough, unspecified: Secondary | ICD-10-CM | POA: Insufficient documentation

## 2011-10-27 DIAGNOSIS — R079 Chest pain, unspecified: Secondary | ICD-10-CM | POA: Insufficient documentation

## 2011-10-27 DIAGNOSIS — R791 Abnormal coagulation profile: Secondary | ICD-10-CM | POA: Insufficient documentation

## 2011-10-27 DIAGNOSIS — I10 Essential (primary) hypertension: Secondary | ICD-10-CM | POA: Insufficient documentation

## 2011-10-27 DIAGNOSIS — R002 Palpitations: Secondary | ICD-10-CM | POA: Insufficient documentation

## 2011-10-27 LAB — DIFFERENTIAL
Basophils Relative: 0 % (ref 0–1)
Eosinophils Absolute: 0.3 10*3/uL (ref 0.0–0.7)
Eosinophils Relative: 3 % (ref 0–5)
Lymphs Abs: 2.3 10*3/uL (ref 0.7–4.0)
Neutrophils Relative %: 58 % (ref 43–77)

## 2011-10-27 LAB — POCT I-STAT, CHEM 8
BUN: 13 mg/dL (ref 6–23)
Calcium, Ion: 1.26 mmol/L (ref 1.12–1.32)
Chloride: 107 mEq/L (ref 96–112)
Creatinine, Ser: 1 mg/dL (ref 0.50–1.10)
Glucose, Bld: 112 mg/dL — ABNORMAL HIGH (ref 70–99)
HCT: 36 % (ref 36.0–46.0)
Hemoglobin: 12.2 g/dL (ref 12.0–15.0)
Potassium: 3.8 mEq/L (ref 3.5–5.1)
Sodium: 142 mEq/L (ref 135–145)
TCO2: 25 mmol/L (ref 0–100)

## 2011-10-27 LAB — CBC
MCH: 29 pg (ref 26.0–34.0)
MCHC: 33.1 g/dL (ref 30.0–36.0)
MCV: 87.3 fL (ref 78.0–100.0)
Platelets: 289 10*3/uL (ref 150–400)
RDW: 13.1 % (ref 11.5–15.5)

## 2011-10-27 LAB — D-DIMER, QUANTITATIVE: D-Dimer, Quant: 1.49 ug/mL-FEU — ABNORMAL HIGH (ref 0.00–0.48)

## 2011-10-27 LAB — CARDIAC PANEL(CRET KIN+CKTOT+MB+TROPI)
CK, MB: 1.2 ng/mL (ref 0.3–4.0)
Total CK: 84 U/L (ref 7–177)

## 2011-10-27 MED ORDER — IOHEXOL 300 MG/ML  SOLN
130.0000 mL | Freq: Once | INTRAMUSCULAR | Status: AC | PRN
  Administered 2011-10-27: 130 mL via INTRAVENOUS

## 2011-10-27 MED ORDER — ONDANSETRON HCL 4 MG/2ML IJ SOLN
4.0000 mg | Freq: Once | INTRAMUSCULAR | Status: AC
  Administered 2011-10-27: 4 mg via INTRAVENOUS
  Filled 2011-10-27: qty 2

## 2011-10-27 NOTE — ED Notes (Signed)
Xray at the bedside.

## 2011-10-27 NOTE — Discharge Instructions (Signed)
Cardiac markers were normal.  The d-dimer was elevated, but your chest CT and GI was negative for pulmonary emboli.  Please make an appointment with Dr. Amador Cunas for follow up.

## 2011-10-27 NOTE — ED Notes (Signed)
[id number]<AV> Expected date:<BR> Expected time: 6:55 PM<BR> Means of arrival:<BR> Comments:<BR> M130 - 40yoF syncope

## 2011-10-27 NOTE — ED Notes (Signed)
Pt. Discharged to home with husband, NAD noted, pt. Ambulatory gait steady

## 2011-10-27 NOTE — ED Provider Notes (Signed)
Medical screening examination/treatment/procedure(s) were conducted as a shared visit with non-physician practitioner(s) and myself.  I personally evaluated the patient during the encounter   Celene Kras, MD 10/27/11 [phone]

## 2011-10-27 NOTE — ED Provider Notes (Signed)
History     CSN: [id number]  Arrival date & time 10/27/11  1906   First MD Initiated Contact with Patient 10/27/11 2005      Chief Complaint  Patient presents with  . Loss of Consciousness    (Consider location/radiation/quality/duration/timing/severity/associated sxs/prior treatment) HPI Comments: [name] states for the last couple, days.  She's had intermittent episodes of rapid heartbeat and chest pain across her shoulders that lasts for several seconds to several minutes.  Tonight.  Episode was after she was in the bathroom.  She was walking out, felt lightheaded stood still for a moment and the next thing she knew she she was being awakened by her husband and family in the emergency room.  She continues to have intermittent episodes of this sensation in her chest and shoulders.  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  She has no increased risk factors for PE at this time.  She has not traveled.  She does not use hormone replacement is not smoke  The history is provided by the patient.    Past Medical History  Diagnosis Date  . Allergy   . Hypertension   . IBS (irritable bowel syndrome)   . Nephropathy   . Hyperlipemia   . Anxiety   . GERD (gastroesophageal reflux disease)   . Chronic constipation     Past Surgical History  Procedure Date  . Abdominal hysterectomy 1998  . Cholecystectomy 1997  . Esophagogastroduodenoscopy     Family History  Problem Relation Age of Onset  . Hypertension Father     History  Substance Use Topics  . Smoking status: Never Smoker   . Smokeless tobacco: Never Used  . Alcohol Use: No    OB History    Grav Para Term Preterm Abortions TAB SAB Ect Mult Living                  Review of Systems  Constitutional: Negative for fever and chills.  Respiratory: Negative for shortness of breath.   Cardiovascular: Positive for chest pain and palpitations. Negative for leg swelling.    Gastrointestinal: Negative for nausea.  Musculoskeletal: Negative for myalgias.  Skin: Negative for pallor.  Neurological: Positive for syncope and light-headedness. Negative for weakness and headaches.    Allergies  Aspirin; Food; Tramadol; and Tylenol  Home Medications   Current Outpatient Rx  Name Route Sig Dispense Refill  . AMLODIPINE BESYLATE 5 MG PO TABS  TAKE ONE TABLET BY MOUTH EVERY DAY 90 tablet 3  . BENAZEPRIL HCL 40 MG PO TABS Oral Take 40 mg by mouth daily.      Marland Kitchen DOCUSATE SODIUM 100 MG PO CAPS Oral Take 100 mg by mouth daily.     Marland Kitchen LEVOTHYROXINE SODIUM 137 MCG PO TABS  TAKE ONE TABLET BY MOUTH EVERY DAY 90 tablet 0    Will need to be seen in Jan for future refills  . LORATADINE 10 MG PO TABS Oral Take 10 mg by mouth daily.      Marland Kitchen METOPROLOL TARTRATE 50 MG PO TABS  TAKE ONE TABLET BY MOUTH TWICE DAILY 180 tablet 0    Will need to be seen in Jan for future refills  . ONE-DAILY MULTI VITAMINS PO TABS Oral Take 1 tablet by mouth daily.      . ADULT GUMMY PO CHEW Oral Chew 1 tablet by mouth daily.    Marland Kitchen OMEPRAZOLE 40 MG PO CPDR  TAKE ONE CAPSULE BY MOUTH EVERY DAY 90 capsule 3  . PROMETHAZINE HCL 25 MG PO TABS  TAKE ONE TABLET BY MOUTH EVERY 6 HOURS AS NEEDED FOR NAUSEA 15 tablet 0  . VALSARTAN 160 MG PO TABS Oral Take 160 mg by mouth daily.    Marland Kitchen LOSARTAN POTASSIUM 100 MG PO TABS  TAKE ONE TABLET BY MOUTH EVERY DAY 90 tablet 3    BP 146/91  Pulse 57  Temp(Src) 98.8 F (37.1 C) (Oral)  Resp 16  Ht 5' 6.5" (1.689 m)  Wt 214 lb (97.07 kg)  BMI 34.02 kg/m2  SpO2 99%  Physical Exam  Constitutional: She is oriented to person, place, and time. She appears well-developed and well-nourished.  HENT:  Head: Normocephalic.  Neck: Normal range of motion.  Cardiovascular: Normal rate, regular rhythm and normal heart sounds.   Pulmonary/Chest: Effort normal and breath sounds normal. She exhibits no tenderness.  Abdominal: Soft.  Musculoskeletal: Normal range of motion. She  exhibits no edema.  Neurological: She is alert and oriented to person, place, and time.  Skin: Skin is warm and dry.    ED Course  Procedures (including critical care time)  Labs Reviewed  CBC - Abnormal; Notable for the following:    Hemoglobin 11.9 (*)    HCT 35.9 (*)    All other components within normal limits  D-DIMER, QUANTITATIVE - Abnormal; Notable for the following:    D-Dimer, Quant 1.49 (*)    All other components within normal limits  POCT I-STAT, CHEM 8 - Abnormal; Notable for the following:    Glucose, Bld 112 (*)    All other components within normal limits  DIFFERENTIAL  CARDIAC PANEL(CRET KIN+CKTOT+MB+TROPI)   Ct Angio Chest W/cm &/or Wo Cm  10/27/2011  *RADIOLOGY REPORT*  Clinical Data: Chest pain, shortness of breath, elevated D-dimer.  CT ANGIOGRAPHY CHEST  Technique:  Multidetector CT imaging of the chest using the standard protocol during bolus administration of intravenous contrast. Multiplanar reconstructed images including MIPs were obtained and reviewed to evaluate the vascular anatomy.  Contrast: OMNIPAQUE IOHEXOL 300 MG/ML IJ SOLN  Comparison: 10/27/2011 chest radiograph, 06/07/2011 CT  Findings: No main pulmonary arterial branch filling defect identified.  Normal caliber aorta.  Normal heart size to mildly enlarged.  No pleural or pericardial effusion.  No intrathoracic lymphadenopathy.  Mild bibasilar dependent atelectasis and mild mosaic attenuation, may reflect air trapping. No pleural effusion.  Limited images through the upper abdomen show no acute abnormality. Cholecystectomy clips.  No acute osseous abnormality.  Mild multilevel degenerative change.  IMPRESSION: No pulmonary arterial filling defect.  Mild lung base atelectasis.  Original Report Authenticated By: Waneta Martins, M.D.   Dg Chest Port 1 View  10/27/2011  *RADIOLOGY REPORT*  Clinical Data: Chest pain and cough  PORTABLE CHEST - 1 VIEW  Comparison: 10/20 09/05/2010 CT  Findings:  Heart size upper normal limits to mildly enlarged. Minimal left lower lobe opacity.  No pleural effusion.  No pneumothorax.  Surgical clips project over the trachea superiorly, perhaps post thyroidectomy.  No acute osseous abnormality.  IMPRESSION: Minimal left lower lobe opacity; atelectasis versus mild infiltrate.  Original Report Authenticated By: Waneta Martins, M.D.     1. Syncope, non cardiac     ED ECG REPORT   Date: 10/27/2011  EKG Time: 11:57 PM  Rate62   Rhythm: normal sinus rhythm,  unchanged from previous tracings  Axis: normal  Intervals:none  ST&T Change: Borderline T wave abnormalities in anterior  leads  Narrative Interpretation: abnormal EKG            MDM  Will evaluates for cardiac pain verses arrythmia verses PE with patients history of previous history  Cardiac enzymes have come back normal, but her d-dimer is, elevated, we'll obtain chest CT to rule out recurrent PE       Arman Filter, NP 10/27/11 2357

## 2011-10-27 NOTE — ED Notes (Signed)
Pt. OOB to BR gait steady,

## 2011-10-27 NOTE — ED Notes (Addendum)
Received pt. [facility] with c/o " passing out", several episodes of CP and bilat. Arm and shoulder pain in last several day, cough,

## 2011-10-27 NOTE — ED Notes (Signed)
Pt. C/o nausea. 

## 2011-10-27 NOTE — ED Notes (Signed)
Pt states she came out of her bathroom and passed out. C/o heart racing and sharp sternal chest pain. Hx of PE in 2008, feels the same.

## 2011-12-29 ENCOUNTER — Emergency Department (HOSPITAL_COMMUNITY)
Admission: EM | Admit: 2011-12-29 | Discharge: 2011-12-29 | Disposition: A | Discharge status: Home / Self Care | Payer: Self-pay | Source: Home / Self Care | Attending: Family Medicine | Admitting: Family Medicine

## 2011-12-29 ENCOUNTER — Encounter (HOSPITAL_COMMUNITY): Payer: Self-pay | Admitting: Emergency Medicine

## 2011-12-29 DIAGNOSIS — K5900 Constipation, unspecified: Secondary | ICD-10-CM

## 2011-12-29 LAB — POCT URINALYSIS DIP (DEVICE)
Bilirubin Urine: NEGATIVE
Glucose, UA: NEGATIVE mg/dL
Hgb urine dipstick: NEGATIVE
Specific Gravity, Urine: 1.025 (ref 1.005–1.030)
Urobilinogen, UA: 0.2 mg/dL (ref 0.0–1.0)
pH: 6 (ref 5.0–8.0)

## 2011-12-29 LAB — WET PREP, GENITAL: Trich, Wet Prep: NONE SEEN

## 2011-12-29 NOTE — ED Notes (Signed)
PT HEREWITH UTI SX LOWER ABD PRESSURE AND [place] SHARP PAIN WITH BURN WITH URINATION/ITCH AND RECTAL PRESSURE.DENIES BLEEDING OR HEMATURIA.SX STARTED X 4 DYS AGO.PT STATES URINE STREAM REGULAR

## 2011-12-29 NOTE — Discharge Instructions (Signed)
Try laxative,increase fiber, fluids and try probiotic (Align). See your doctor if further problems.

## 2011-12-29 NOTE — ED Provider Notes (Signed)
History     CSN: [id number]  Arrival date & time 12/29/11  1357   First MD Initiated Contact with Patient 12/29/11 1405      No chief complaint on file.   (Consider location/radiation/quality/duration/timing/severity/associated sxs/prior treatment) Patient is a 41 y.o. female presenting with abdominal pain. The history is provided by the patient.  Abdominal Pain The primary symptoms of the illness include abdominal pain. The primary symptoms of the illness do not include fever, nausea, vomiting, diarrhea, dysuria, vaginal discharge or vaginal bleeding. The current episode started more than 2 days ago. The onset of the illness was gradual. The problem has not changed since onset. The patient states that she believes she is currently not pregnant. The patient has had a change in bowel habit. Additional symptoms associated with the illness include constipation and back pain. Symptoms associated with the illness do not include urgency or frequency.    Past Medical History  Diagnosis Date  . Allergy   . Hypertension   . IBS (irritable bowel syndrome)   . Nephropathy   . Hyperlipemia   . Anxiety   . GERD (gastroesophageal reflux disease)   . Chronic constipation     Past Surgical History  Procedure Date  . Abdominal hysterectomy 1998  . Cholecystectomy 1997  . Esophagogastroduodenoscopy     Family History  Problem Relation Age of Onset  . Hypertension Father     History  Substance Use Topics  . Smoking status: Never Smoker   . Smokeless tobacco: Never Used  . Alcohol Use: No    OB History    Grav Para Term Preterm Abortions TAB SAB Ect Mult Living                  Review of Systems  Constitutional: Negative.  Negative for fever.  Gastrointestinal: Positive for abdominal pain and constipation. Negative for nausea, vomiting and diarrhea.  Genitourinary: Negative for dysuria, urgency, frequency, vaginal bleeding and vaginal discharge.  Musculoskeletal: Positive for  back pain.    Allergies  Aspirin; Food; Tramadol; and Tylenol  Home Medications   Current Outpatient Rx  Name Route Sig Dispense Refill  . AMLODIPINE BESYLATE 5 MG PO TABS  TAKE ONE TABLET BY MOUTH EVERY DAY 90 tablet 3  . BENAZEPRIL HCL 40 MG PO TABS Oral Take 40 mg by mouth daily.      Marland Kitchen DOCUSATE SODIUM 100 MG PO CAPS Oral Take 100 mg by mouth daily.     Marland Kitchen LEVOTHYROXINE SODIUM 137 MCG PO TABS  TAKE ONE TABLET BY MOUTH EVERY DAY 90 tablet 0    Will need to be seen in Jan for future refills  . LORATADINE 10 MG PO TABS Oral Take 10 mg by mouth daily.      Marland Kitchen LOSARTAN POTASSIUM 100 MG PO TABS  TAKE ONE TABLET BY MOUTH EVERY DAY 90 tablet 3  . METOPROLOL TARTRATE 50 MG PO TABS  TAKE ONE TABLET BY MOUTH TWICE DAILY 180 tablet 0    Will need to be seen in Jan for future refills  . ONE-DAILY MULTI VITAMINS PO TABS Oral Take 1 tablet by mouth daily.      . ADULT GUMMY PO CHEW Oral Chew 1 tablet by mouth daily.    Marland Kitchen OMEPRAZOLE 40 MG PO CPDR  TAKE ONE CAPSULE BY MOUTH EVERY DAY 90 capsule 3  . PROMETHAZINE HCL 25 MG PO TABS  TAKE ONE TABLET BY MOUTH EVERY 6 HOURS AS NEEDED FOR NAUSEA 15 tablet 0  .  VALSARTAN 160 MG PO TABS Oral Take 160 mg by mouth daily.      BP 135/95  Pulse 75  Temp(Src) 99.3 F (37.4 C) (Oral)  Resp 16  SpO2 97%  Physical Exam  Nursing note and vitals reviewed. Constitutional: She is oriented to person, place, and time. She appears well-developed and well-nourished.  Pulmonary/Chest: Breath sounds normal.  Abdominal: Soft. Bowel sounds are normal.  Genitourinary: Vagina normal.       S/p hysterectomy, no pelvic tenderness or masses on exam  Neurological: She is alert and oriented to person, place, and time.  Skin: Skin is warm and dry.  Psychiatric: She has a normal mood and affect. Her behavior is normal.    ED Course  Procedures (including critical care time)  Labs Reviewed  POCT URINALYSIS DIP (DEVICE) - Abnormal; Notable for the following:    Protein,  ur 100 (*)    All other components within normal limits  WET PREP, GENITAL   No results found.   1. Constipation       MDM          Linna Hoff, MD 12/29/11 1544

## 2012-01-01 ENCOUNTER — Telehealth (HOSPITAL_COMMUNITY): Payer: Self-pay | Admitting: *Deleted

## 2012-01-01 MED ORDER — METRONIDAZOLE 250 MG PO TABS: 250.0000 mg | ORAL_TABLET | Freq: Three times a day (TID) | ORAL | Status: AC

## 2012-01-01 NOTE — ED Notes (Signed)
Wet prep: many clue cells, few WBC's.  No GC/Chlamydia done.  Lab shown to Dr. Artis Flock and order obtained for Flagyl 250 mg. TID x 7 days.  He e-prescribed it to [facility] on [address]. I called pt.   Pt. verified x 2 and given results.   Pt. instructed to no alcohol while taking this medication.  Pt. said she does not drink. She asked what causes bacterial vaginosis?  I gave her the usual causes and she did not think it was any of those. I told her to talk to her doctor if it keeps recurring frequently. Vassie Moselle. 01/01/2012

## 2012-01-01 NOTE — ED Notes (Addendum)
This chart access was to investigate prescription found on desk; pt not in clinic ; encounter was 5-14, rx discarded

## 2012-01-04 ENCOUNTER — Telehealth (HOSPITAL_COMMUNITY): Payer: Self-pay | Admitting: *Deleted

## 2012-01-04 NOTE — ED Notes (Signed)
Pt. called and said the Rx. of Flagyl was not at [facility] on [address] on Friday or today. I told her I would call it in. I called the pharmacist @ [phone]. He does not have a record of it. I read the order as prescribed on  5/17 by Dr. Artis Flock. Vassie Moselle 01/04/2012

## 2012-01-09 ENCOUNTER — Emergency Department (HOSPITAL_COMMUNITY)
Admission: EM | Admit: 2012-01-09 | Discharge: 2012-01-09 | Disposition: A | Discharge status: Home / Self Care | Payer: Self-pay | Attending: Emergency Medicine | Admitting: Emergency Medicine

## 2012-01-09 ENCOUNTER — Encounter (HOSPITAL_COMMUNITY): Payer: Self-pay | Admitting: *Deleted

## 2012-01-09 DIAGNOSIS — M549 Dorsalgia, unspecified: Secondary | ICD-10-CM | POA: Insufficient documentation

## 2012-01-09 DIAGNOSIS — E785 Hyperlipidemia, unspecified: Secondary | ICD-10-CM | POA: Insufficient documentation

## 2012-01-09 DIAGNOSIS — I10 Essential (primary) hypertension: Secondary | ICD-10-CM | POA: Insufficient documentation

## 2012-01-09 DIAGNOSIS — K219 Gastro-esophageal reflux disease without esophagitis: Secondary | ICD-10-CM | POA: Insufficient documentation

## 2012-01-09 DIAGNOSIS — M5430 Sciatica, unspecified side: Secondary | ICD-10-CM

## 2012-01-09 MED ORDER — GLUCOSE 40 % PO GEL
ORAL | Status: AC
  Filled 2012-01-09: qty 1

## 2012-01-09 MED ORDER — CYCLOBENZAPRINE HCL 10 MG PO TABS: 10.0000 mg | ORAL_TABLET | Freq: Two times a day (BID) | ORAL | Status: AC | PRN

## 2012-01-09 MED ORDER — PREDNISONE 10 MG PO TABS: ORAL_TABLET | ORAL | Status: DC

## 2012-01-09 NOTE — ED Provider Notes (Signed)
History     CSN: [id number]  Arrival date & time 01/09/12  1246   First MD Initiated Contact with Patient 01/09/12 1329      Chief Complaint  Patient presents with  . Back Pain  . Leg Pain    (Consider location/radiation/quality/duration/timing/severity/associated sxs/prior treatment) Patient is a 41 y.o. female presenting with back pain. The history is provided by the patient. No language interpreter was used.  Back Pain  This is a new problem. The current episode started more than 2 days ago. The problem occurs constantly. The problem has been gradually worsening. The pain is associated with no known injury. The pain is present in the lumbar spine. The quality of the pain is described as stabbing, shooting and aching. The pain is at a severity of 7/10. The pain is moderate. The symptoms are aggravated by certain positions. The pain is the same all the time. Stiffness is present all day. She has tried nothing for the symptoms. The treatment provided no relief. Risk factors: none.  Pt reports she has pain in right low back that goes down her right leg,  Pt complains of pain with walking.  Past Medical History  Diagnosis Date  . Allergy   . Hypertension   . IBS (irritable bowel syndrome)   . Nephropathy   . Hyperlipemia   . Anxiety   . GERD (gastroesophageal reflux disease)   . Chronic constipation     Past Surgical History  Procedure Date  . Abdominal hysterectomy 1998  . Cholecystectomy 1997  . Esophagogastroduodenoscopy   . Tubal ligation     Family History  Problem Relation Age of Onset  . Hypertension Father     History  Substance Use Topics  . Smoking status: Never Smoker   . Smokeless tobacco: Never Used  . Alcohol Use: No    OB History    Grav Para Term Preterm Abortions TAB SAB Ect Mult Living                  Review of Systems  Musculoskeletal: Positive for back pain.  All other systems reviewed and are negative.    Allergies  Aspirin; Food;  Tramadol; and Tylenol  Home Medications   Current Outpatient Rx  Name Route Sig Dispense Refill  . AMLODIPINE BESYLATE 5 MG PO TABS  TAKE ONE TABLET BY MOUTH EVERY DAY 90 tablet 3  . CETIRIZINE HCL 10 MG PO TABS Oral Take 10 mg by mouth daily.    Marland Kitchen DOCUSATE SODIUM 100 MG PO CAPS Oral Take 100 mg by mouth daily.     Marland Kitchen ESOMEPRAZOLE MAGNESIUM 40 MG PO CPDR Oral Take 40 mg by mouth daily before breakfast.    . LEVOTHYROXINE SODIUM 137 MCG PO TABS  TAKE ONE TABLET BY MOUTH EVERY DAY 90 tablet 0    Will need to be seen in Jan for future refills  . METOPROLOL TARTRATE 50 MG PO TABS  TAKE ONE TABLET BY MOUTH TWICE DAILY 180 tablet 0    Will need to be seen in Jan for future refills  . METRONIDAZOLE 250 MG PO TABS Oral Take 1 tablet (250 mg total) by mouth 3 (three) times daily. 21 tablet 0  . ONE-DAILY MULTI VITAMINS PO TABS Oral Take 1 tablet by mouth daily.      . QUINAPRIL HCL 40 MG PO TABS Oral Take 40 mg by mouth daily.    Marland Kitchen VALSARTAN 160 MG PO TABS Oral Take 160 mg by mouth daily.  BP 127/86  Pulse 64  Temp(Src) 98.3 F (36.8 C) (Oral)  Resp 19  SpO2 99%  Physical Exam  Nursing note and vitals reviewed. Constitutional: She is oriented to person, place, and time. She appears well-developed and well-nourished.  HENT:  Head: Normocephalic and atraumatic.  Eyes: Pupils are equal, round, and reactive to light.  Neck: Normal range of motion.  Cardiovascular: Normal rate and normal heart sounds.   Pulmonary/Chest: Effort normal and breath sounds normal.  Abdominal: Soft.  Musculoskeletal: Normal range of motion.       Tender ls spine,  From,   No deformity  Neurological: She is alert and oriented to person, place, and time. She has normal reflexes.  Skin: Skin is warm.  Psychiatric: She has a normal mood and affect.    ED Course  Procedures (including critical care time)  Labs Reviewed - No data to display No results found.   No diagnosis found.    MDM  Pain follows  sciatic nerve.   Pt advised to see her MD for recheck in 3-4 days.  Ice to area of pain.    Pt given rx for flexeril and prednisone.          Lonia Skinner [place], [place] 01/09/12 1342

## 2012-01-09 NOTE — ED Notes (Signed)
Pt reports she has had pain to lower back with radiation to left leg and hip starting Wednesday evening. Pt denies known injury to back. Pt reports she was seen in February for the same and diagnosed with a back strain. Pt reports pain is the same as previous.  Pt denies loss of bowel or bladder function and denies numbness to groin area.

## 2012-01-09 NOTE — Discharge Instructions (Signed)

## 2012-01-17 NOTE — ED Provider Notes (Signed)
Medical screening examination/treatment/procedure(s) were performed by non-physician practitioner and as supervising physician I was immediately available for consultation/collaboration.  Keary Hanak, MD 01/17/12 0030 

## 2012-01-30 ENCOUNTER — Emergency Department (HOSPITAL_COMMUNITY)
Admission: EM | Admit: 2012-01-30 | Discharge: 2012-01-30 | Disposition: A | Discharge status: Home / Self Care | Payer: Self-pay | Attending: Emergency Medicine | Admitting: Emergency Medicine

## 2012-01-30 DIAGNOSIS — J209 Acute bronchitis, unspecified: Secondary | ICD-10-CM | POA: Insufficient documentation

## 2012-01-30 DIAGNOSIS — E785 Hyperlipidemia, unspecified: Secondary | ICD-10-CM | POA: Insufficient documentation

## 2012-01-30 DIAGNOSIS — K219 Gastro-esophageal reflux disease without esophagitis: Secondary | ICD-10-CM | POA: Insufficient documentation

## 2012-01-30 DIAGNOSIS — I10 Essential (primary) hypertension: Secondary | ICD-10-CM | POA: Insufficient documentation

## 2012-01-30 LAB — URINALYSIS, ROUTINE W REFLEX MICROSCOPIC
Bilirubin Urine: NEGATIVE
Glucose, UA: NEGATIVE mg/dL
Glucose, UA: NEGATIVE mg/dL
Hgb urine dipstick: NEGATIVE
Ketones, ur: NEGATIVE mg/dL
Nitrite: NEGATIVE
Protein, ur: 30 mg/dL — AB
Specific Gravity, Urine: 1.019 (ref 1.005–1.030)
Urobilinogen, UA: 0.2 mg/dL (ref 0.0–1.0)
pH: 6 (ref 5.0–8.0)

## 2012-01-30 LAB — URINE MICROSCOPIC-ADD ON

## 2012-01-30 MED ORDER — DOXYCYCLINE HYCLATE 100 MG PO CAPS: 100.0000 mg | ORAL_CAPSULE | Freq: Two times a day (BID) | ORAL | Status: AC

## 2012-01-30 MED ORDER — ALBUTEROL SULFATE HFA 108 (90 BASE) MCG/ACT IN AERS
2.0000 | INHALATION_SPRAY | RESPIRATORY_TRACT | Status: DC | PRN
  Administered 2012-01-30: 2 via RESPIRATORY_TRACT
  Filled 2012-01-30: qty 6.7

## 2012-01-30 NOTE — Discharge Instructions (Signed)

## 2012-01-30 NOTE — ED Notes (Signed)
Pt c/o nonproductive "dry and nagging cough".  Pt states "urine been cloudy and smelly.  Pt c/o chest and back pain when coughing.  Pt denies fever.

## 2012-01-30 NOTE — ED Notes (Signed)
Pt given discharge instructions/explained, escorted to discharge window. 

## 2012-01-30 NOTE — ED Notes (Signed)
Inhaler explained and demonstrated by patient on proper use, dry, non-productive cough is present

## 2012-01-30 NOTE — ED Provider Notes (Signed)
History     CSN: [id number]  Arrival date & time 01/30/12  0902   First MD Initiated Contact with Patient 01/30/12 0912      Chief Complaint  Patient presents with  . Cough    (Consider location/radiation/quality/duration/timing/severity/associated sxs/prior treatment) Patient is a 41 y.o. female presenting with cough. The history is provided by the patient.  Cough This is a new problem. Associated symptoms include chest pain. Pertinent negatives include no headaches and no shortness of breath.   patient has had a cough for 2 weeks. She states it is dry but it feels as if there is something that needs to come up. She states she's not had a fever, but has felt hot and cold. She states she has chest and back pain with the coughing. She does not smoke. No lightheadedness dizziness. No headache. No sore throat. She's also complaining of urinary burning. No fevers. She states she's been going on more frequently to. She states her urine is cloudy and smells. No vaginal bleeding or discharge. She denies possibility of pregnancy.  Past Medical History  Diagnosis Date  . Allergy   . Hypertension   . IBS (irritable bowel syndrome)   . Nephropathy   . Hyperlipemia   . Anxiety   . GERD (gastroesophageal reflux disease)   . Chronic constipation     Past Surgical History  Procedure Date  . Abdominal hysterectomy 1998  . Cholecystectomy 1997  . Esophagogastroduodenoscopy   . Tubal ligation     Family History  Problem Relation Age of Onset  . Hypertension Father     History  Substance Use Topics  . Smoking status: Never Smoker   . Smokeless tobacco: Never Used  . Alcohol Use: No    OB History    Grav Para Term Preterm Abortions TAB SAB Ect Mult Living                  Review of Systems  Constitutional: Negative for activity change and appetite change.  HENT: Negative for neck stiffness.   Eyes: Negative for pain.  Respiratory: Positive for cough. Negative for chest  tightness and shortness of breath.   Cardiovascular: Positive for chest pain. Negative for leg swelling.  Gastrointestinal: Negative for nausea, vomiting, abdominal pain and diarrhea.  Genitourinary: Positive for dysuria and frequency. Negative for flank pain.  Musculoskeletal: Positive for back pain.  Skin: Negative for rash.  Neurological: Negative for weakness, numbness and headaches.  Psychiatric/Behavioral: Negative for behavioral problems.    Allergies  Aspirin; Food; Tramadol; and Tylenol  Home Medications   Current Outpatient Rx  Name Route Sig Dispense Refill  . AMLODIPINE BESYLATE 5 MG PO TABS  TAKE ONE TABLET BY MOUTH EVERY DAY 90 tablet 3  . CETIRIZINE HCL 10 MG PO TABS Oral Take 10 mg by mouth daily.    Marland Kitchen DOCUSATE SODIUM 100 MG PO CAPS Oral Take 100 mg by mouth daily.     Marland Kitchen ESOMEPRAZOLE MAGNESIUM 40 MG PO CPDR Oral Take 40 mg by mouth daily before breakfast.    . LEVOTHYROXINE SODIUM 137 MCG PO TABS  TAKE ONE TABLET BY MOUTH EVERY DAY 90 tablet 0    Will need to be seen in Jan for future refills  . METOPROLOL SUCCINATE ER 50 MG PO TB24 Oral Take 50 mg by mouth daily. Take with or immediately following a meal.    . ONE-DAILY MULTI VITAMINS PO TABS Oral Take 1 tablet by mouth daily.      Marland Kitchen  QUINAPRIL HCL 40 MG PO TABS Oral Take 40 mg by mouth every evening.     Marland Kitchen VALSARTAN 160 MG PO TABS Oral Take 160 mg by mouth daily.    Marland Kitchen DOXYCYCLINE HYCLATE 100 MG PO CAPS Oral Take 1 capsule (100 mg total) by mouth 2 (two) times daily. 20 capsule 0    BP 141/98  Pulse 64  Temp 99.1 F (37.3 C) (Oral)  Resp 18  SpO2 98%  Physical Exam  Nursing note and vitals reviewed. Constitutional: She is oriented to person, place, and time. She appears well-developed and well-nourished.  HENT:  Head: Normocephalic and atraumatic.  Mouth/Throat: No oropharyngeal exudate.  Eyes: EOM are normal. Pupils are equal, round, and reactive to light.  Neck: Neck supple.  Cardiovascular: Normal rate,  regular rhythm and normal heart sounds.   No murmur heard. Pulmonary/Chest: Effort normal and breath sounds normal. No respiratory distress. She has no wheezes. She has no rales.       Cough and slightly harsh breath sounds.  Abdominal: Soft. Bowel sounds are normal. She exhibits no distension. There is no tenderness. There is no rebound and no guarding.  Genitourinary:       No CVA tenderness  Musculoskeletal: Normal range of motion.  Neurological: She is alert and oriented to person, place, and time. No cranial nerve deficit.  Skin: Skin is warm and dry.  Psychiatric: She has a normal mood and affect. Her speech is normal.    ED Course  Procedures (including critical care time)  Labs Reviewed  URINALYSIS, ROUTINE W REFLEX MICROSCOPIC - Abnormal; Notable for the following:    APPearance CLOUDY (*)     Protein, ur 100 (*)     Leukocytes, UA MODERATE (*)     All other components within normal limits  URINE MICROSCOPIC-ADD ON - Abnormal; Notable for the following:    Squamous Epithelial / LPF MANY (*)     Bacteria, UA FEW (*)     All other components within normal limits  URINALYSIS, ROUTINE W REFLEX MICROSCOPIC - Abnormal; Notable for the following:    Protein, ur 30 (*)     Leukocytes, UA MODERATE (*)     All other components within normal limits  URINE MICROSCOPIC-ADD ON - Abnormal; Notable for the following:    Squamous Epithelial / LPF FEW (*)     All other components within normal limits  URINE CULTURE   No results found.   1. Acute bronchitis       MDM  Patient's had URI symptoms and cough for the last 2 weeks. Been no hypoxia. No focal findings. Due to the time course initial be given antibiotics. She's also complaining of urinary symptoms. Urinalysis was not clear for a UTI. There was few bacteria but no white cells. Urine culture is sent.        Juliet Rude. Rubin Payor, MD 01/30/12 1159

## 2012-02-02 LAB — URINE CULTURE: Colony Count: 85000

## 2012-02-03 NOTE — ED Notes (Signed)
+   Urine Chart appended per protocol MD. 

## 2012-03-12 ENCOUNTER — Encounter (HOSPITAL_COMMUNITY): Payer: Self-pay | Admitting: Emergency Medicine

## 2012-03-12 ENCOUNTER — Emergency Department (HOSPITAL_COMMUNITY)
Admission: EM | Admit: 2012-03-12 | Discharge: 2012-03-12 | Disposition: A | Discharge status: Home / Self Care | Payer: Self-pay | Attending: Emergency Medicine | Admitting: Emergency Medicine

## 2012-03-12 ENCOUNTER — Emergency Department (HOSPITAL_COMMUNITY): Payer: Self-pay

## 2012-03-12 DIAGNOSIS — M79605 Pain in left leg: Secondary | ICD-10-CM

## 2012-03-12 DIAGNOSIS — M79609 Pain in unspecified limb: Secondary | ICD-10-CM | POA: Insufficient documentation

## 2012-03-12 DIAGNOSIS — Z86711 Personal history of pulmonary embolism: Secondary | ICD-10-CM | POA: Insufficient documentation

## 2012-03-12 DIAGNOSIS — I10 Essential (primary) hypertension: Secondary | ICD-10-CM | POA: Insufficient documentation

## 2012-03-12 DIAGNOSIS — M79602 Pain in left arm: Secondary | ICD-10-CM

## 2012-03-12 DIAGNOSIS — T887XXA Unspecified adverse effect of drug or medicament, initial encounter: Secondary | ICD-10-CM

## 2012-03-12 DIAGNOSIS — K219 Gastro-esophageal reflux disease without esophagitis: Secondary | ICD-10-CM | POA: Insufficient documentation

## 2012-03-12 DIAGNOSIS — E785 Hyperlipidemia, unspecified: Secondary | ICD-10-CM | POA: Insufficient documentation

## 2012-03-12 LAB — CBC WITH DIFFERENTIAL/PLATELET
Basophils Absolute: 0 10*3/uL (ref 0.0–0.1)
Eosinophils Absolute: 0.4 10*3/uL (ref 0.0–0.7)
Eosinophils Relative: 5 % (ref 0–5)
Lymphocytes Relative: 38 % (ref 12–46)
MCH: 28.9 pg (ref 26.0–34.0)
MCV: 86.3 fL (ref 78.0–100.0)
Neutrophils Relative %: 46 % (ref 43–77)
Platelets: 297 10*3/uL (ref 150–400)
RDW: 13.3 % (ref 11.5–15.5)
WBC: 7.1 10*3/uL (ref 4.0–10.5)

## 2012-03-12 LAB — TROPONIN I: Troponin I: <0.3 ng/mL (ref <0.30)

## 2012-03-12 LAB — BASIC METABOLIC PANEL
Calcium: 9.9 mg/dL (ref 8.4–10.5)
GFR calc non Af Amer: 51 mL/min — ABNORMAL LOW (ref >90)
Sodium: 138 mEq/L (ref 135–145)

## 2012-03-12 MED ORDER — DIPHENHYDRAMINE HCL 25 MG PO CAPS: 25.0000 mg | ORAL_CAPSULE | Freq: Four times a day (QID) | ORAL | Status: DC | PRN

## 2012-03-12 MED ORDER — DIPHENHYDRAMINE HCL 50 MG/ML IJ SOLN
25.0000 mg | Freq: Four times a day (QID) | INTRAMUSCULAR | Status: DC | PRN
  Administered 2012-03-12: 25 mg via INTRAVENOUS
  Filled 2012-03-12: qty 1

## 2012-03-12 MED ORDER — SODIUM CHLORIDE 0.9 % IV BOLUS (SEPSIS)
1000.0000 mL | Freq: Once | INTRAVENOUS | Status: AC
  Administered 2012-03-12: 1000 mL via INTRAVENOUS

## 2012-03-12 MED ORDER — IOHEXOL 350 MG/ML SOLN
100.0000 mL | Freq: Once | INTRAVENOUS | Status: AC | PRN
  Administered 2012-03-12: 100 mL via INTRAVENOUS

## 2012-03-12 NOTE — Discharge Instructions (Signed)
Please return to the ER for the ultrasound scans to make sure there are no clots in the leg or arms. Take benadryl for the itching. Finish the antibiotic course, since there are only 2 more days remaining.    Drug Allergy Allergic reactions to medicines are common. Some allergic reactions are mild. A delayed type of drug allergy that occurs 1 week or more after exposure to a medicine or vaccine is called serum sickness. A life-threatening, sudden (acute) allergic reaction that involves the whole body is called anaphylaxis. CAUSES  "True" drug allergies occur when there is an allergic reaction to a medicine. This is caused by overactivity of the immune system. First, the body becomes sensitized. The immune system is triggered by your first exposure to the medicine. Following this first exposure, future exposure to the same medicine may be life-threatening. Almost any medicine can cause an allergic reaction. Common ones are:  Penicillin.   Sulfonamides (sulfa drugs).   Local anesthetics.   X-ray dyes that contain iodine.  SYMPTOMS  Common symptoms of a minor allergic reaction are:  Swelling around the mouth.   An itchy red rash or hives.   Vomiting or diarrhea.  Anaphylaxis can cause swelling of the mouth and throat. This makes it difficult to breathe and swallow. Severe reactions can be fatal within seconds, even after exposure to only a trace amount of the drug that causes the reaction. HOME CARE INSTRUCTIONS   If you are unsure of what caused your reaction, keep a diary of foods and medicines used. Include the symptoms that followed. Avoid anything that causes reactions.   You may want to follow up with an allergy specialist after the reaction has cleared in order to be tested to confirm the allergy. It is important to confirm that your reaction is an allergy, not just a side effect to the medicine. If you have a true allergy to a medicine, this may prevent that medicine and related  medicines from being given to you when you are very ill.   If you have hives or a rash:   Take medicines as directed by your caregiver.   You may use an over-the-counter antihistamine (diphenhydramine) as needed.   Apply cold compresses to the skin or take baths in cool water. Avoid hot baths or showers.   If you are severely allergic:   Continuous observation after a severe reaction may be needed. Hospitalization is often required.   Wear a medical alert bracelet or necklace stating your allergy.   You and your family must learn how to use an anaphylaxis kit or give an epinephrine injection to temporarily treat an emergency allergic reaction. If you have had a severe reaction, always carry your epinephrine injection or anaphylaxis kit with you. This can be lifesaving if you have a severe reaction.   Do not drive or perform tasks after treatment until the medicines used to treat your reaction have worn off, or until your caregiver says it is okay.  SEEK MEDICAL CARE IF:   You think you had an allergic reaction. Symptoms usually start within 30 minutes after exposure.   Symptoms are getting worse rather than better.   You develop new symptoms.   The symptoms that brought you to your caregiver return.  SEEK IMMEDIATE MEDICAL CARE IF:   You have swelling of the mouth, difficulty breathing, or wheezing.   You have a tight feeling in your chest or throat.   You develop hives, swelling, or itching all over  your body.   You develop severe vomiting or diarrhea.   You feel faint or pass out.  This is an emergency. Use your epinephrine injection or anaphylaxis kit as you have been instructed. Call for emergency medical help. Even if you improve after the injection, you need to be examined at a hospital emergency department. MAKE SURE YOU:   Understand these instructions.   Will watch your condition.   Will get help right away if you are not doing well or get worse.  Document  Released: 08/03/2005 Document Revised: 07/23/2011 Document Reviewed: 01/07/2011 [facility] Patient Information 2012 [place], [place].

## 2012-03-12 NOTE — ED Notes (Signed)
Onset of left arm and leg itching and burning. Left leg has a hot feeling on the inside of leg going down. Worse today so presented to ED

## 2012-03-12 NOTE — ED Provider Notes (Signed)
History     CSN: [id number]  Arrival date & time 03/12/12  1451   First MD Initiated Contact with Patient 03/12/12 1614      Chief Complaint  Patient presents with  . Leg Pain    Left itching and burning  . Arm Pain    Left itching and burning    (Consider location/radiation/quality/duration/timing/severity/associated sxs/prior treatment) HPI Comments: 41 y/o female comes in with cc of itching and leg and arm pain. Pt has hx of PE, and is on cipro for her UTI. 3 days ago, and 2 days after starting the AB, she started having generalized itching. No known hx of allergies, and she denies any wheezing, swelling. Pt also has hx of PE, and states that she is intermittently getting SOB on occasions, unprovoked and her chest feels "funny." She has no cardiac hx. She also has LUE burning pain and heaviness, and LLE burning pain and she had similar symptoms when she was diagnosed with PE. Pt has no hx of primary lung disease.    Patient is a 41 y.o. female presenting with leg pain and arm pain. The history is provided by the patient and medical records.  Leg Pain   Arm Pain Associated symptoms include shortness of breath. Pertinent negatives include no chest pain and no abdominal pain.    Past Medical History  Diagnosis Date  . Allergy   . Hypertension   . IBS (irritable bowel syndrome)   . Nephropathy   . Hyperlipemia   . Anxiety   . GERD (gastroesophageal reflux disease)   . Chronic constipation     Past Surgical History  Procedure Date  . Abdominal hysterectomy 1998  . Cholecystectomy 1997  . Esophagogastroduodenoscopy   . Tubal ligation     Family History  Problem Relation Age of Onset  . Hypertension Father     History  Substance Use Topics  . Smoking status: Never Smoker   . Smokeless tobacco: Never Used  . Alcohol Use: No    OB History    Grav Para Term Preterm Abortions TAB SAB Ect Mult Living                  Review of Systems  Constitutional:  Negative for diaphoresis, activity change and fatigue.  HENT: Negative for facial swelling and neck pain.   Respiratory: Positive for cough and shortness of breath. Negative for choking, chest tightness and wheezing.   Cardiovascular: Negative for chest pain and leg swelling.  Gastrointestinal: Negative for nausea, vomiting, abdominal pain, diarrhea, constipation, blood in stool and abdominal distention.  Genitourinary: Negative for hematuria and difficulty urinating.  Skin: Negative for color change and rash.  Neurological: Negative for speech difficulty.  Hematological: Does not bruise/bleed easily.  Psychiatric/Behavioral: Negative for confusion.    Allergies  Aspirin; Food; Tramadol; and Tylenol  Home Medications   Current Outpatient Rx  Name Route Sig Dispense Refill  . AMLODIPINE BESYLATE 5 MG PO TABS  TAKE ONE TABLET BY MOUTH EVERY DAY 90 tablet 3  . CETIRIZINE HCL 10 MG PO TABS Oral Take 10 mg by mouth daily.    Marland Kitchen DOCUSATE SODIUM 100 MG PO CAPS Oral Take 100 mg by mouth daily.     Marland Kitchen ESOMEPRAZOLE MAGNESIUM 40 MG PO CPDR Oral Take 40 mg by mouth daily before breakfast.    . LEVOTHYROXINE SODIUM 137 MCG PO TABS  TAKE ONE TABLET BY MOUTH EVERY DAY 90 tablet 0    Will need to be seen  in Jan for future refills  . METOPROLOL SUCCINATE ER 50 MG PO TB24 Oral Take 50 mg by mouth daily. Take with or immediately following a meal.    . ONE-DAILY MULTI VITAMINS PO TABS Oral Take 1 tablet by mouth daily.      . QUINAPRIL HCL 40 MG PO TABS Oral Take 40 mg by mouth every evening.     Marland Kitchen VALSARTAN 160 MG PO TABS Oral Take 160 mg by mouth daily.      BP 127/84  Pulse 65  Temp 98.5 F (36.9 C) (Oral)  SpO2 99%  Physical Exam  Constitutional: She is oriented to person, place, and time. She appears well-developed.  HENT:  Head: Normocephalic and atraumatic.  Eyes: Conjunctivae and EOM are normal. Pupils are equal, round, and reactive to light.  Neck: Normal range of motion. Neck supple.    Cardiovascular: Normal rate, regular rhythm and normal heart sounds.   Pulmonary/Chest: Effort normal and breath sounds normal. No respiratory distress.       No reproducible extremity pain, or chest pain  Abdominal: Soft. Bowel sounds are normal. She exhibits no distension. There is no tenderness. There is no rebound and no guarding.  Musculoskeletal: Normal range of motion. She exhibits no edema and no tenderness.       Bilateral lower extremity are equal, and there is no erythema, palpable cord  Neurological: She is alert and oriented to person, place, and time.  Skin: Skin is warm and dry. No rash noted. No erythema.    ED Course  Procedures (including critical care time)   Labs Reviewed  CBC WITH DIFFERENTIAL  BASIC METABOLIC PANEL  TROPONIN I   Dg Chest 2 View  03/12/2012  *RADIOLOGY REPORT*  Clinical Data: Chest pain  CHEST - 2 VIEW  Comparison: 10/27/2011  Findings: Mild cardiomegaly.  Minimal subsegmental atelectasis at the left base.  Upper lungs clear.  No pneumothorax.  No pleural effusion.  IMPRESSION: Minimal left base atelectasis.  Original Report Authenticated By: Donavan Burnet, M.D.     No diagnosis found.    MDM  Pt w/ hx of PE and recent AB use comes in with cc of unilateral extremity pain and whole body itching. It appears that she is having an allergic rxn to the cipro. We will give benadryl and reassess. No mucosal involvement. Pt also has some intermittent sob and chest pain? - has hx of PE. With the unilateral leg and extremity pain, we will get CT scan for PE and DVT scans.        Derwood Kaplan, MD 03/12/12 1725

## 2012-03-13 ENCOUNTER — Ambulatory Visit (HOSPITAL_COMMUNITY): Payer: Self-pay | Attending: Emergency Medicine

## 2012-04-27 DIAGNOSIS — R05 Cough: Secondary | ICD-10-CM | POA: Insufficient documentation

## 2012-04-27 DIAGNOSIS — Z86711 Personal history of pulmonary embolism: Secondary | ICD-10-CM | POA: Insufficient documentation

## 2012-04-27 DIAGNOSIS — E039 Hypothyroidism, unspecified: Secondary | ICD-10-CM | POA: Insufficient documentation

## 2012-04-27 DIAGNOSIS — R059 Cough, unspecified: Secondary | ICD-10-CM | POA: Insufficient documentation

## 2012-04-27 DIAGNOSIS — Z79899 Other long term (current) drug therapy: Secondary | ICD-10-CM | POA: Insufficient documentation

## 2012-04-27 DIAGNOSIS — I10 Essential (primary) hypertension: Principal | ICD-10-CM | POA: Insufficient documentation

## 2012-04-27 DIAGNOSIS — E785 Hyperlipidemia, unspecified: Secondary | ICD-10-CM | POA: Insufficient documentation

## 2012-04-27 DIAGNOSIS — R0789 Other chest pain: Secondary | ICD-10-CM | POA: Insufficient documentation

## 2012-04-27 HISTORY — PX: TRANSTHORACIC ECHOCARDIOGRAM: SHX275

## 2012-04-28 ENCOUNTER — Encounter (HOSPITAL_COMMUNITY): Payer: Self-pay | Admitting: *Deleted

## 2012-04-28 ENCOUNTER — Observation Stay (HOSPITAL_COMMUNITY)
Admission: EM | Admit: 2012-04-28 | Discharge: 2012-04-30 | Disposition: A | Discharge status: Home / Self Care | Payer: BC Managed Care – PPO | Attending: Internal Medicine | Admitting: Internal Medicine

## 2012-04-28 ENCOUNTER — Observation Stay (HOSPITAL_COMMUNITY): Payer: BC Managed Care – PPO

## 2012-04-28 ENCOUNTER — Emergency Department (HOSPITAL_COMMUNITY): Payer: BC Managed Care – PPO

## 2012-04-28 DIAGNOSIS — E785 Hyperlipidemia, unspecified: Secondary | ICD-10-CM

## 2012-04-28 DIAGNOSIS — K219 Gastro-esophageal reflux disease without esophagitis: Secondary | ICD-10-CM

## 2012-04-28 DIAGNOSIS — R079 Chest pain, unspecified: Secondary | ICD-10-CM | POA: Diagnosis present

## 2012-04-28 DIAGNOSIS — N390 Urinary tract infection, site not specified: Secondary | ICD-10-CM

## 2012-04-28 DIAGNOSIS — I10 Essential (primary) hypertension: Principal | ICD-10-CM

## 2012-04-28 DIAGNOSIS — K625 Hemorrhage of anus and rectum: Secondary | ICD-10-CM

## 2012-04-28 DIAGNOSIS — N760 Acute vaginitis: Secondary | ICD-10-CM

## 2012-04-28 DIAGNOSIS — Z86711 Personal history of pulmonary embolism: Secondary | ICD-10-CM

## 2012-04-28 DIAGNOSIS — I824Y9 Acute embolism and thrombosis of unspecified deep veins of unspecified proximal lower extremity: Secondary | ICD-10-CM

## 2012-04-28 DIAGNOSIS — J069 Acute upper respiratory infection, unspecified: Secondary | ICD-10-CM

## 2012-04-28 DIAGNOSIS — E039 Hypothyroidism, unspecified: Secondary | ICD-10-CM | POA: Diagnosis present

## 2012-04-28 DIAGNOSIS — M549 Dorsalgia, unspecified: Secondary | ICD-10-CM

## 2012-04-28 DIAGNOSIS — J209 Acute bronchitis, unspecified: Secondary | ICD-10-CM

## 2012-04-28 DIAGNOSIS — J309 Allergic rhinitis, unspecified: Secondary | ICD-10-CM

## 2012-04-28 DIAGNOSIS — F411 Generalized anxiety disorder: Secondary | ICD-10-CM

## 2012-04-28 DIAGNOSIS — N289 Disorder of kidney and ureter, unspecified: Secondary | ICD-10-CM

## 2012-04-28 LAB — COMPREHENSIVE METABOLIC PANEL WITH GFR
ALT: 12 U/L (ref 0–35)
AST: 13 U/L (ref 0–37)
Albumin: 3.5 g/dL (ref 3.5–5.2)
Alkaline Phosphatase: 72 U/L (ref 39–117)
BUN: 10 mg/dL (ref 6–23)
CO2: 27 meq/L (ref 19–32)
Calcium: 9.5 mg/dL (ref 8.4–10.5)
Chloride: 104 meq/L (ref 96–112)
Creatinine, Ser: 0.99 mg/dL (ref 0.50–1.10)
GFR calc Af Amer: 80 mL/min — ABNORMAL LOW (ref >90)
GFR calc non Af Amer: 69 mL/min — ABNORMAL LOW (ref >90)
Glucose, Bld: 99 mg/dL (ref 70–99)
Potassium: 3.6 meq/L (ref 3.5–5.1)
Sodium: 139 meq/L (ref 135–145)
Total Bilirubin: 0.2 mg/dL — ABNORMAL LOW (ref 0.3–1.2)
Total Protein: 7.6 g/dL (ref 6.0–8.3)

## 2012-04-28 LAB — CBC
HCT: 37.1 % (ref 36.0–46.0)
Hemoglobin: 12.4 g/dL (ref 12.0–15.0)
Hemoglobin: 12.2 g/dL (ref 12.0–15.0)
MCH: 28.4 pg (ref 26.0–34.0)
MCH: 28.4 pg (ref 26.0–34.0)
MCHC: 32.9 g/dL (ref 30.0–36.0)
MCV: 86.5 fL (ref 78.0–100.0)
Platelets: 285 10*3/uL (ref 150–400)
Platelets: 284 K/uL (ref 150–400)
RBC: 4.36 MIL/uL (ref 3.87–5.11)
RBC: 4.29 MIL/uL (ref 3.87–5.11)
RDW: 13.1 % (ref 11.5–15.5)
WBC: 6.3 K/uL (ref 4.0–10.5)

## 2012-04-28 LAB — LIPID PANEL
Cholesterol: 186 mg/dL (ref 0–200)
HDL: 40 mg/dL (ref >39)
LDL Cholesterol: 132 mg/dL — ABNORMAL HIGH (ref 0–99)
Total CHOL/HDL Ratio: 4.7 ratio
Triglycerides: 72 mg/dL (ref <150)
VLDL: 14 mg/dL (ref 0–40)

## 2012-04-28 LAB — BASIC METABOLIC PANEL
CO2: 27 mEq/L (ref 19–32)
Calcium: 9.6 mg/dL (ref 8.4–10.5)
GFR calc non Af Amer: 62 mL/min — ABNORMAL LOW (ref >90)
Glucose, Bld: 99 mg/dL (ref 70–99)
Potassium: 3.6 mEq/L (ref 3.5–5.1)
Sodium: 138 mEq/L (ref 135–145)

## 2012-04-28 LAB — HEMOGLOBIN A1C
Hgb A1c MFr Bld: 5.8 % — ABNORMAL HIGH (ref <5.7)
Mean Plasma Glucose: 120 mg/dL — ABNORMAL HIGH (ref <117)

## 2012-04-28 LAB — TROPONIN I
Troponin I: <0.3 ng/mL (ref <0.30)
Troponin I: <0.3 ng/mL (ref <0.30)
Troponin I: <0.3 ng/mL (ref <0.30)

## 2012-04-28 LAB — D-DIMER, QUANTITATIVE: D-Dimer, Quant: 1.52 ug{FEU}/mL — ABNORMAL HIGH (ref 0.00–0.48)

## 2012-04-28 MED ORDER — AMLODIPINE BESYLATE 5 MG PO TABS
5.0000 mg | ORAL_TABLET | Freq: Every day | ORAL | Status: DC
  Administered 2012-04-28 – 2012-04-29 (×2): 5 mg via ORAL
  Filled 2012-04-28 (×3): qty 1

## 2012-04-28 MED ORDER — DOCUSATE SODIUM 100 MG PO CAPS
100.0000 mg | ORAL_CAPSULE | Freq: Every day | ORAL | Status: DC
  Administered 2012-04-28 – 2012-04-29 (×2): 100 mg via ORAL
  Filled 2012-04-28 (×3): qty 1

## 2012-04-28 MED ORDER — QUINAPRIL HCL 10 MG PO TABS
40.0000 mg | ORAL_TABLET | Freq: Every evening | ORAL | Status: DC
  Filled 2012-04-28 (×2): qty 4

## 2012-04-28 MED ORDER — INFLUENZA VIRUS VACC SPLIT PF IM SUSP
0.5000 mL | INTRAMUSCULAR | Status: AC
  Administered 2012-04-28: 0.5 mL via INTRAMUSCULAR
  Filled 2012-04-28: qty 0.5

## 2012-04-28 MED ORDER — SODIUM CHLORIDE 0.9 % IJ SOLN
3.0000 mL | Freq: Two times a day (BID) | INTRAMUSCULAR | Status: DC
  Administered 2012-04-28 – 2012-04-29 (×4): 3 mL via INTRAVENOUS

## 2012-04-28 MED ORDER — NITROGLYCERIN 0.4 MG SL SUBL
0.4000 mg | SUBLINGUAL_TABLET | SUBLINGUAL | Status: DC | PRN
  Administered 2012-04-28: 0.4 mg via SUBLINGUAL

## 2012-04-28 MED ORDER — TECHNETIUM TO 99M ALBUMIN AGGREGATED
3.3000 | Freq: Once | INTRAVENOUS | Status: AC | PRN
  Administered 2012-04-28: 3.3 via INTRAVENOUS

## 2012-04-28 MED ORDER — ADULT MULTIVITAMIN W/MINERALS CH
1.0000 | ORAL_TABLET | Freq: Every day | ORAL | Status: DC
  Administered 2012-04-28 – 2012-04-29 (×2): 1 via ORAL
  Filled 2012-04-28 (×3): qty 1

## 2012-04-28 MED ORDER — ENOXAPARIN SODIUM 40 MG/0.4ML ~~LOC~~ SOLN
40.0000 mg | SUBCUTANEOUS | Status: DC
  Administered 2012-04-28 – 2012-04-29 (×2): 40 mg via SUBCUTANEOUS
  Filled 2012-04-28 (×3): qty 0.4

## 2012-04-28 MED ORDER — DIPHENHYDRAMINE HCL 25 MG PO CAPS: 25.0000 mg | ORAL_CAPSULE | Freq: Four times a day (QID) | ORAL | Status: DC | PRN

## 2012-04-28 MED ORDER — LORATADINE 10 MG PO TABS
10.0000 mg | ORAL_TABLET | Freq: Every day | ORAL | Status: DC
  Administered 2012-04-28 – 2012-04-29 (×2): 10 mg via ORAL
  Filled 2012-04-28 (×3): qty 1

## 2012-04-28 MED ORDER — NITROGLYCERIN 0.4 MG SL SUBL: 0.4000 mg | SUBLINGUAL_TABLET | SUBLINGUAL | Status: DC | PRN

## 2012-04-28 MED ORDER — ONE-DAILY MULTI VITAMINS PO TABS: 1.0000 | ORAL_TABLET | Freq: Every day | ORAL | Status: DC

## 2012-04-28 MED ORDER — IRBESARTAN 150 MG PO TABS
150.0000 mg | ORAL_TABLET | Freq: Every day | ORAL | Status: DC
  Administered 2012-04-28 – 2012-04-29 (×2): 150 mg via ORAL
  Filled 2012-04-28 (×3): qty 1

## 2012-04-28 MED ORDER — PANTOPRAZOLE SODIUM 40 MG PO TBEC
40.0000 mg | DELAYED_RELEASE_TABLET | Freq: Every day | ORAL | Status: DC
  Administered 2012-04-28 – 2012-04-29 (×2): 40 mg via ORAL
  Filled 2012-04-28 (×3): qty 1

## 2012-04-28 MED ORDER — LEVOTHYROXINE SODIUM 137 MCG PO TABS
137.0000 ug | ORAL_TABLET | Freq: Every day | ORAL | Status: DC
  Administered 2012-04-28 – 2012-04-30 (×3): 137 ug via ORAL
  Filled 2012-04-28 (×4): qty 1

## 2012-04-28 MED ORDER — MORPHINE SULFATE 2 MG/ML IJ SOLN
1.0000 mg | INTRAMUSCULAR | Status: DC | PRN
  Administered 2012-04-28 – 2012-04-29 (×3): 1 mg via INTRAVENOUS
  Filled 2012-04-28 (×3): qty 1

## 2012-04-28 MED ORDER — ONDANSETRON HCL 4 MG PO TABS: 4.0000 mg | ORAL_TABLET | Freq: Four times a day (QID) | ORAL | Status: DC | PRN

## 2012-04-28 MED ORDER — ONDANSETRON HCL 4 MG/2ML IJ SOLN
4.0000 mg | Freq: Four times a day (QID) | INTRAMUSCULAR | Status: DC | PRN
  Administered 2012-04-28: 4 mg via INTRAVENOUS
  Filled 2012-04-28: qty 2

## 2012-04-28 MED ORDER — METOPROLOL SUCCINATE ER 50 MG PO TB24
50.0000 mg | ORAL_TABLET | Freq: Every day | ORAL | Status: DC
  Administered 2012-04-28 – 2012-04-29 (×2): 50 mg via ORAL
  Filled 2012-04-28 (×3): qty 1

## 2012-04-28 NOTE — ED Notes (Signed)
Pt states that she began having chest pain yesterday and that is has gotten progressively worse. Pt states that the pain is constant but does decrease some when she lays down but then comes back again soon after. Pt c/o Nausea and diaphoresis earlier before arrival. Pt denies worsening with breaths.

## 2012-04-28 NOTE — ED Notes (Signed)
Called report to floor RN. Told she was giving meds and would call back.

## 2012-04-28 NOTE — ED Notes (Signed)
Pt reports non-productive cough and central chest pain that began yesterday intermittently and worse today - pt denies shortness of breath and n/v. Pt in no acute distress at present, speaking complete sentences, skin warm and dry.

## 2012-04-28 NOTE — Progress Notes (Signed)
*  Preliminary Results* Bilateral lower extremity venous duplex completed. Bilateral lower extremities are negative for deep vein thrombosis.  04/28/2012 11:34 AM Gertie Fey, RDMS, RDCS

## 2012-04-28 NOTE — ED Provider Notes (Signed)
History     CSN: [id number]  Arrival date & time 04/27/12  2315   First MD Initiated Contact with Patient 04/28/12 [phone]      Chief Complaint  Patient presents with  . Chest Pain    (Consider location/radiation/quality/duration/timing/severity/associated sxs/prior treatment) HPI This is a 41 year old black female with a two-day history of intermittent chest pain. She describes the pain as being sharp and in the center of her chest. It radiates to her left shoulder and neck. There is a superimposed stabbing pain that is intermittent. She describes the pain as being a 7/10 at its worst. She is only having slight discomfort at this time. It has not been associated with shortness of breath or nausea but has had some mild sweating associated with it. She's had a cough which is nonproductive. The pain does not seem to be brought on by anything. It was significantly relieved by sublingual nitroglycerin given by her nurse per protocol.  Past Medical History  Diagnosis Date  . Allergy   . Hypertension   . IBS (irritable bowel syndrome)   . Nephropathy   . Hyperlipemia   . Anxiety   . GERD (gastroesophageal reflux disease)   . Chronic constipation     Past Surgical History  Procedure Date  . Abdominal hysterectomy 1998  . Cholecystectomy 1997  . Esophagogastroduodenoscopy   . Tubal ligation     Family History  Problem Relation Age of Onset  . Hypertension Father     History  Substance Use Topics  . Smoking status: Never Smoker   . Smokeless tobacco: Never Used  . Alcohol Use: No    OB History    Grav Para Term Preterm Abortions TAB SAB Ect Mult Living                  Review of Systems  All other systems reviewed and are negative.    Allergies  Aspirin; Food; Tramadol; and Tylenol  Home Medications   Current Outpatient Rx  Name Route Sig Dispense Refill  . AMLODIPINE BESYLATE 5 MG PO TABS Oral Take 5 mg by mouth daily.    . DESLORATADINE 5 MG PO TABS Oral  Take 5 mg by mouth daily.    Marland Kitchen DOCUSATE SODIUM 100 MG PO CAPS Oral Take 100 mg by mouth daily.     Marland Kitchen ESOMEPRAZOLE MAGNESIUM 40 MG PO CPDR Oral Take 40 mg by mouth daily before breakfast.    . LEVOTHYROXINE SODIUM 137 MCG PO TABS Oral Take 137 mcg by mouth daily.    Marland Kitchen METOPROLOL SUCCINATE ER 50 MG PO TB24 Oral Take 50 mg by mouth daily. Take with or immediately following a meal.    . ONE-DAILY MULTI VITAMINS PO TABS Oral Take 1 tablet by mouth daily.      . QUINAPRIL HCL 40 MG PO TABS Oral Take 40 mg by mouth every evening.     Marland Kitchen VALSARTAN 160 MG PO TABS Oral Take 160 mg by mouth daily.    Marland Kitchen DIPHENHYDRAMINE HCL 25 MG PO CAPS Oral Take 1 capsule (25 mg total) by mouth every 6 (six) hours as needed for itching. 20 capsule 0    BP 130/91  Pulse 78  Temp 98.7 F (37.1 C) (Oral)  Resp 18  SpO2 98%  Physical Exam General: Well-developed, well-nourished female in no acute distress; appearance consistent with age of record HENT: normocephalic, atraumatic Eyes: pupils equal round and reactive to light; extraocular muscles intact Neck: supple Heart: regular rate  and rhythm gallops Lungs: clear to auscultation bilaterally Abdomen: soft; nondistended; nontender; bowel sounds present Extremities: No deformity; full range of motion; pulses normal; no edema Neurologic: Awake, alert and oriented; motor function intact in all extremities and symmetric; no facial droop Skin: Warm and dry Psychiatric: Normal mood and affect    ED Course  Procedures (including critical care time)     MDM   Nursing notes and vitals signs, including pulse oximetry, reviewed.  Summary of this visit's results, reviewed by myself:  Labs:  Results for orders placed during the hospital encounter of 04/28/12  TROPONIN I      Component Value Range   Troponin I <0.30  <0.30 ng/mL  CBC      Component Value Range   WBC 7.8  4.0 - 10.5 K/uL   RBC 4.36  3.87 - 5.11 MIL/uL   Hemoglobin 12.4  12.0 - 15.0 g/dL   HCT  78.4  69.6 - 29.5 %   MCV 86.5  78.0 - 100.0 fL   MCH 28.4  26.0 - 34.0 pg   MCHC 32.9  30.0 - 36.0 g/dL   RDW 28.4  13.2 - 44.0 %   Platelets 285  150 - 400 K/uL  BASIC METABOLIC PANEL      Component Value Range   Sodium 138  135 - 145 mEq/L   Potassium 3.6  3.5 - 5.1 mEq/L   Chloride 102  96 - 112 mEq/L   CO2 27  19 - 32 mEq/L   Glucose, Bld 99  70 - 99 mg/dL   BUN 11  6 - 23 mg/dL   Creatinine, Ser 1.02  0.50 - 1.10 mg/dL   Calcium 9.6  8.4 - 72.5 mg/dL   GFR calc non Af Amer 62 (*) >90 mL/min   GFR calc Af Amer 72 (*) >90 mL/min    Imaging Studies: Dg Chest 2 View  04/28/2012  *RADIOLOGY REPORT*  Clinical Data: Nonproductive cough.  Chest pain.  CHEST - 2 VIEW  Comparison: 03/12/2012  Findings: The heart size and pulmonary vascularity are normal. The lungs appear clear and expanded without focal air space disease or consolidation. No blunting of the costophrenic angles.  No pneumothorax.  Mediastinal contours appear intact. Surgical clips in the thoracic inlet.  Small cervical ribs.  No significant change since previous study.  IMPRESSION: No evidence of active pulmonary disease.   Original Report Authenticated By: Marlon Pel, M.D.    EKG Interpretation:  Date & Time: 04/28/2012 12:14 AM  Rate: 68  Rhythm: normal sinus rhythm  QRS Axis: normal  Intervals: normal  ST/T Wave abnormalities: nonspecific T wave changes  Conduction Disutrbances:none  Narrative Interpretation:   Old EKG Reviewed: unchanged  Will have admitted for possible ACS.            Hanley Seamen, MD 04/28/12 [phone]

## 2012-04-28 NOTE — Progress Notes (Signed)
No new changes noted, agree with last nurse assessment.

## 2012-04-28 NOTE — Progress Notes (Signed)
  Echocardiogram 2D Echocardiogram has been performed.  Cathie Beams 04/28/2012, 9:41 AM

## 2012-04-28 NOTE — ED Notes (Signed)
Pt allergic to ASA, therefore, not administered as per the chest pain protocol.

## 2012-04-28 NOTE — Care Management Note (Addendum)
    Page 1 of 1   04/29/2012     12:17:08 PM   CARE MANAGEMENT NOTE 04/29/2012  Patient:  Rhonda [name], Rhonda [name]   Account Number:  [id number]  Date Initiated:  04/28/2012  Documentation initiated by:  Lanier Clam  Subjective/Objective Assessment:   ADMITTED W/CHEST PAIN.     Action/Plan:   FROM HOME W/SPOUSE   Anticipated DC Date:  04/29/2012   Anticipated DC Plan:  HOME/SELF CARE      DC Planning Services  CM consult      Choice offered to / List presented to:             Status of service:  Completed, signed off Medicare Important Message given?   (If response is "NO", the following Medicare IM given date fields will be blank) Date Medicare IM given:   Date Additional Medicare IM given:    Discharge Disposition:  HOME/SELF CARE  Per UR Regulation:  Reviewed for med. necessity/level of care/duration of stay  If discussed at Long Length of Stay Meetings, dates discussed:    Comments:  04/28/12 [facility] Alie Moudy RN,BSN NCM [phone]

## 2012-04-28 NOTE — Progress Notes (Signed)
Patient seen and examined 2-D echo, d-dimer, cycle cardiac enzymes, Check for his factors with hemoglobin A1c, lipid panel A d-dimer elevated VQ scan and ocular bilateral lower extremities will be done Continue Synthroid

## 2012-04-28 NOTE — H&P (Signed)
Rhonda [name] is an 41 y.o. female.   Patient was seen and examined on April 29, 2011 at 4:50 AM. PCP - Dr. Barton Fanny. Chief Complaint: Chest pain. HPI: 41 year-old female with known history of hypertension, C1q nephropathy, postsurgical hypothyroidism, GERD and history of pulmonary embolism in 2008 presents with complaints of chest pain over the last 2 days. The pain is retrosternal stabbing in nature denies any associate shortness of breath. Denies nausea vomiting or diaphoresis. She has started developing some cough denies any fever chills. The cough is nonproductive. Patient also had some diarrhea 3 episodes has not used any antibiotics recently. In the ER EKG was showing nonspecific T-wave changes. Cardiac enzymes have been negative. Chest x-ray is unremarkable. Patient will be admitted for further management. At this time patient denies any chest pain.  Past Medical History  Diagnosis Date  . Allergy   . Hypertension   . IBS (irritable bowel syndrome)   . Nephropathy   . Hyperlipemia   . Anxiety   . GERD (gastroesophageal reflux disease)   . Chronic constipation     Past Surgical History  Procedure Date  . Abdominal hysterectomy 1998  . Cholecystectomy 1997  . Esophagogastroduodenoscopy   . Tubal ligation     Family History  Problem Relation Age of Onset  . Hypertension Father   . Coronary artery disease Father    Social History:  reports that she has never smoked. She has never used smokeless tobacco. She reports that she does not drink alcohol or use illicit drugs.  Allergies:  Allergies  Allergen Reactions  . Aspirin Hives  . Food Other (See Comments)    Lima beans  . Tramadol Other (See Comments)    Headache   . Tylenol (Acetaminophen) Other (See Comments)    Irritates tongue      (Not in a hospital admission)  Results for orders placed during the hospital encounter of 04/28/12 (from the past 48 hour(s))  TROPONIN I     Status: Normal   Collection Time   04/28/12 12:52 AM      Component Value Range Comment   Troponin I <0.30  <0.30 ng/mL   CBC     Status: Normal   Collection Time   04/28/12 12:52 AM      Component Value Range Comment   WBC 7.8  4.0 - 10.5 K/uL    RBC 4.36  3.87 - 5.11 MIL/uL    Hemoglobin 12.4  12.0 - 15.0 g/dL    HCT 16.1  09.6 - 04.5 %    MCV 86.5  78.0 - 100.0 fL    MCH 28.4  26.0 - 34.0 pg    MCHC 32.9  30.0 - 36.0 g/dL    RDW 40.9  81.1 - 91.4 %    Platelets 285  150 - 400 K/uL   BASIC METABOLIC PANEL     Status: Abnormal   Collection Time   04/28/12 12:52 AM      Component Value Range Comment   Sodium 138  135 - 145 mEq/L    Potassium 3.6  3.5 - 5.1 mEq/L    Chloride 102  96 - 112 mEq/L    CO2 27  19 - 32 mEq/L    Glucose, Bld 99  70 - 99 mg/dL    BUN 11  6 - 23 mg/dL    Creatinine, Ser 7.82  0.50 - 1.10 mg/dL    Calcium 9.6  8.4 - 95.6 mg/dL  GFR calc non Af Amer 62 (*) >90 mL/min    GFR calc Af Amer 72 (*) >90 mL/min    Dg Chest 2 View  04/28/2012  *RADIOLOGY REPORT*  Clinical Data: Nonproductive cough.  Chest pain.  CHEST - 2 VIEW  Comparison: 03/12/2012  Findings: The heart size and pulmonary vascularity are normal. The lungs appear clear and expanded without focal air space disease or consolidation. No blunting of the costophrenic angles.  No pneumothorax.  Mediastinal contours appear intact. Surgical clips in the thoracic inlet.  Small cervical ribs.  No significant change since previous study.  IMPRESSION: No evidence of active pulmonary disease.   Original Report Authenticated By: Marlon Pel, M.D.     Review of Systems  Constitutional: Negative.   HENT: Negative.   Eyes: Negative.   Respiratory: Positive for cough.   Cardiovascular: Positive for chest pain.  Gastrointestinal: Positive for diarrhea.  Genitourinary: Negative.   Musculoskeletal: Negative.   Skin: Negative.   Neurological: Negative.   Endo/Heme/Allergies: Negative.   Psychiatric/Behavioral: Negative.      Blood pressure 135/85, pulse 73, temperature 98.7 F (37.1 C), temperature source Oral, resp. rate 13, SpO2 99.00%. Physical Exam  Constitutional: She is oriented to person, place, and time. She appears well-developed and well-nourished. No distress.  HENT:  Head: Normocephalic and atraumatic.  Right Ear: External ear normal.  Left Ear: External ear normal.  Nose: Nose normal.  Mouth/Throat: Oropharynx is clear and moist. No oropharyngeal exudate.  Eyes: Conjunctivae normal are normal. Pupils are equal, round, and reactive to light. Right eye exhibits no discharge. Left eye exhibits no discharge. No scleral icterus.  Neck: Normal range of motion. Neck supple.  Cardiovascular: Normal rate and regular rhythm.   Respiratory: Effort normal and breath sounds normal. No respiratory distress. She has no wheezes. She has no rales.  GI: Soft. Bowel sounds are normal. She exhibits no distension. There is no tenderness. There is no rebound.  Musculoskeletal: Normal range of motion. She exhibits no edema and no tenderness.  Neurological: She is alert and oriented to person, place, and time.       Moves all extremities.  Skin: Skin is warm and dry. She is not diaphoretic.     Assessment/Plan #1. Chest pain to rule out ACS - patient's chest pain appears atypical. Given history of PE I have ordered VQ scan and Dopplers of lower extremity. We'll avoid CT angiogram of the chest given the history of nephropathy. Cycle cardiac markers. Check 2-D echo. Patient has allergy to aspirin. #2. Hypertension with history of C1q nephropathy - closely follow metabolic panel. Continue ACE inhibitors and ARB. #3. Hypothyroidism, postsurgical - continue Synthroid. Check TSH. #4. History of hysterectomy.  CODE STATUS - full code.  Rhonda [name] N. 04/28/2012, 5:15 AM

## 2012-04-29 LAB — CBC
MCH: 28.5 pg (ref 26.0–34.0)
MCHC: 32.7 g/dL (ref 30.0–36.0)
MCV: 87.2 fL (ref 78.0–100.0)
Platelets: 272 10*3/uL (ref 150–400)
RDW: 13.1 % (ref 11.5–15.5)

## 2012-04-29 LAB — TSH: TSH: 1.942 u[IU]/mL (ref 0.350–4.500)

## 2012-04-29 MED ORDER — NITROGLYCERIN 0.3 MG SL SUBL: 0.3000 mg | SUBLINGUAL_TABLET | SUBLINGUAL | Status: DC | PRN

## 2012-04-29 NOTE — Progress Notes (Signed)
Patient HR in the 40s&50s sustaining. Patient denies any distress. Notified K. Shorr-NP. Will continue to assess/monitor patient.

## 2012-04-29 NOTE — Progress Notes (Signed)
Patient awake, HR in the 60s & 70s.

## 2012-04-29 NOTE — Discharge Summary (Signed)
Physician Discharge Summary  Rhonda [name] MRN: [id number] DOB/AGE: [birth year] 43 y.o.  PCP: Rogelia Boga, MD   Admit date: 04/28/2012 Discharge date: 04/29/2012  Discharge Diagnoses:     *Chest pain, noncardiac Active Problems:  HYPERTENSION, BENIGN ESSENTIAL  Hypothyroidism  Personal history of PE (pulmonary embolism)     Medication List     As of 04/29/2012  9:20 AM    TAKE these medications         amLODipine 5 MG tablet   Commonly known as: NORVASC   Take 5 mg by mouth daily.      desloratadine 5 MG tablet   Commonly known as: CLARINEX   Take 5 mg by mouth daily.      diphenhydrAMINE 25 mg capsule   Commonly known as: BENADRYL   Take 1 capsule (25 mg total) by mouth every 6 (six) hours as needed for itching.      docusate sodium 100 MG capsule   Commonly known as: COLACE   Take 100 mg by mouth daily.      esomeprazole 40 MG capsule   Commonly known as: NEXIUM   Take 40 mg by mouth daily before breakfast.      levothyroxine 137 MCG tablet   Commonly known as: SYNTHROID, LEVOTHROID   Take 137 mcg by mouth daily.      metoprolol succinate 50 MG 24 hr tablet   Commonly known as: TOPROL-XL   Take 50 mg by mouth daily. Take with or immediately following a meal.      multivitamin tablet   Take 1 tablet by mouth daily.      nitroGLYCERIN 0.3 MG SL tablet   Commonly known as: NITROSTAT   Place 1 tablet (0.3 mg total) under the tongue every 5 (five) minutes as needed for chest pain.      quinapril 40 MG tablet   Commonly known as: ACCUPRIL   Take 40 mg by mouth every evening.      valsartan 160 MG tablet   Commonly known as: DIOVAN   Take 160 mg by mouth daily.        Discharge Condition: Table  Disposition: 01-Home or Self Care   Consults: None  Significant Diagnostic Studies: Dg Chest 2 View  04/28/2012  *RADIOLOGY REPORT*  Clinical Data: Nonproductive cough.  Chest pain.  CHEST - 2 VIEW  Comparison: 03/12/2012  Findings:  The heart size and pulmonary vascularity are normal. The lungs appear clear and expanded without focal air space disease or consolidation. No blunting of the costophrenic angles.  No pneumothorax.  Mediastinal contours appear intact. Surgical clips in the thoracic inlet.  Small cervical ribs.  No significant change since previous study.  IMPRESSION: No evidence of active pulmonary disease.   Original Report Authenticated By: Marlon Pel, M.D.    Nm Pulmonary Perfusion  04/28/2012  *RADIOLOGY REPORT*  Clinical Data:  Concern for pulmonary embolism.  Elevated D-dimer  NUCLEAR MEDICINE VENTILATION - PERFUSION LUNG SCAN  Technique:    Perfusion images were obtained in multiple projections after intravenous injection of Tc-47m MAA.  Radiopharmaceuticals: 3.3 mCi Tc-44m MAA.  Comparison: Chest radiograph 04/28/2012  Findings:  Perfusion: There are no wedge shaped peripheral perfusion defects to suggest acute pulmonary embolism.   IMPRESSION:  Very low probability for acute pulmonary embolism.   Original Report Authenticated By: Genevive Bi, M.D.      2-D echo  LV EF: 60% - 65%  ------------------------------------------------------------ Indications: Previous study 05/27/2007. Chest pain 786.51.  ------------------------------------------------------------  History: PMH: Hypothyroidism. Pulmonary embolism. History of acute bronchitis. Risk factors: Hypertension. Dyslipidemia.  ------------------------------------------------------------ Study Conclusions  Left ventricle: The cavity size was normal. Systolic function was normal. The estimated ejection fraction was in the range of 60% to 65%. Wall motion was normal; there were no regional wall motion abnormalities.   Microbiology: No results found for this or any previous visit (from the past 240 hour(s)).   Labs: Results for orders placed during the hospital encounter of 04/28/12 (from the past 48 hour(s))  TROPONIN I     Status:  Normal   Collection Time   04/28/12 12:52 AM      Component Value Range Comment   Troponin I <0.30  <0.30 ng/mL   CBC     Status: Normal   Collection Time   04/28/12 12:52 AM      Component Value Range Comment   WBC 7.8  4.0 - 10.5 K/uL    RBC 4.36  3.87 - 5.11 MIL/uL    Hemoglobin 12.4  12.0 - 15.0 g/dL    HCT 16.1  09.6 - 04.5 %    MCV 86.5  78.0 - 100.0 fL    MCH 28.4  26.0 - 34.0 pg    MCHC 32.9  30.0 - 36.0 g/dL    RDW 40.9  81.1 - 91.4 %    Platelets 285  150 - 400 K/uL   BASIC METABOLIC PANEL     Status: Abnormal   Collection Time   04/28/12 12:52 AM      Component Value Range Comment   Sodium 138  135 - 145 mEq/L    Potassium 3.6  3.5 - 5.1 mEq/L    Chloride 102  96 - 112 mEq/L    CO2 27  19 - 32 mEq/L    Glucose, Bld 99  70 - 99 mg/dL    BUN 11  6 - 23 mg/dL    Creatinine, Ser 7.82  0.50 - 1.10 mg/dL    Calcium 9.6  8.4 - 95.6 mg/dL    GFR calc non Af Amer 62 (*) >90 mL/min    GFR calc Af Amer 72 (*) >90 mL/min   TROPONIN I     Status: Normal   Collection Time   04/28/12  6:40 AM      Component Value Range Comment   Troponin I <0.30  <0.30 ng/mL   COMPREHENSIVE METABOLIC PANEL     Status: Abnormal   Collection Time   04/28/12  6:40 AM      Component Value Range Comment   Sodium 139  135 - 145 mEq/L    Potassium 3.6  3.5 - 5.1 mEq/L    Chloride 104  96 - 112 mEq/L    CO2 27  19 - 32 mEq/L    Glucose, Bld 99  70 - 99 mg/dL    BUN 10  6 - 23 mg/dL    Creatinine, Ser 2.13  0.50 - 1.10 mg/dL    Calcium 9.5  8.4 - 08.6 mg/dL    Total Protein 7.6  6.0 - 8.3 g/dL    Albumin 3.5  3.5 - 5.2 g/dL    AST 13  0 - 37 U/L    ALT 12  0 - 35 U/L    Alkaline Phosphatase 72  39 - 117 U/L    Total Bilirubin 0.2 (*) 0.3 - 1.2 mg/dL    GFR calc non Af Amer 69 (*) >90 mL/min    GFR calc  Af Amer 80 (*) >90 mL/min   LIPASE, BLOOD     Status: Normal   Collection Time   04/28/12  6:40 AM      Component Value Range Comment   Lipase 37  11 - 59 U/L   CBC     Status: Normal    Collection Time   04/28/12  6:40 AM      Component Value Range Comment   WBC 6.3  4.0 - 10.5 K/uL    RBC 4.29  3.87 - 5.11 MIL/uL    Hemoglobin 12.2  12.0 - 15.0 g/dL    HCT 16.1  09.6 - 04.5 %    MCV 86.5  78.0 - 100.0 fL    MCH 28.4  26.0 - 34.0 pg    MCHC 32.9  30.0 - 36.0 g/dL    RDW 40.9  81.1 - 91.4 %    Platelets 284  150 - 400 K/uL   HEMOGLOBIN A1C     Status: Abnormal   Collection Time   04/28/12  8:23 AM      Component Value Range Comment   Hemoglobin A1C 5.8 (*) <5.7 %    Mean Plasma Glucose 120 (*) <117 mg/dL   LIPID PANEL     Status: Abnormal   Collection Time   04/28/12  8:23 AM      Component Value Range Comment   Cholesterol 186  0 - 200 mg/dL    Triglycerides 72  <782 mg/dL    HDL 40  >95 mg/dL    Total CHOL/HDL Ratio 4.7      VLDL 14  0 - 40 mg/dL    LDL Cholesterol 621 (*) 0 - 99 mg/dL   D-DIMER, QUANTITATIVE     Status: Abnormal   Collection Time   04/28/12  8:23 AM      Component Value Range Comment   D-Dimer, Quant 1.52 (*) 0.00 - 0.48 ug/mL-FEU   TROPONIN I     Status: Normal   Collection Time   04/28/12 12:15 PM      Component Value Range Comment   Troponin I <0.30  <0.30 ng/mL   TROPONIN I     Status: Normal   Collection Time   04/28/12  6:40 PM      Component Value Range Comment   Troponin I <0.30  <0.30 ng/mL   CBC     Status: Normal   Collection Time   04/29/12  4:20 AM      Component Value Range Comment   WBC 6.3  4.0 - 10.5 K/uL    RBC 4.53  3.87 - 5.11 MIL/uL    Hemoglobin 12.9  12.0 - 15.0 g/dL    HCT 30.8  65.7 - 84.6 %    MCV 87.2  78.0 - 100.0 fL    MCH 28.5  26.0 - 34.0 pg    MCHC 32.7  30.0 - 36.0 g/dL    RDW 96.2  95.2 - 84.1 %    Platelets 272  150 - 400 K/uL      HPI :41 year-old female with known history of hypertension, C1q nephropathy, postsurgical hypothyroidism, GERD and history of pulmonary embolism in 2008 presented with complaints of chest pain over the last 2 days. The pain is retrosternal stabbing in nature denies  any associate shortness of breath. Denies nausea vomiting or diaphoresis. She has started developing some cough denies any fever chills. The cough is nonproductive. Patient also had some diarrhea 3 episodes has  not used any antibiotics recently. In the ER EKG was showing nonspecific T-wave changes. Cardiac enzymes have been negative. Chest x-ray was found to be unremarkable.     HOSPITAL COURSE:  #1 chest pain workup included cardiac enzymes, EKG, VQ scan due to elevated d-dimer, Doppler of the lower extremities, 2-D echo although these within normal limits as documented above, patient will however need an outpatient stress test because of her history of nephropathy, hypertension, dyslipidemia, her stress test will be arranged for by her primary care provider  #2 hypertension continue outpatient medications  #3 viral gastroenteritis. The patient did have some nausea and diarrhea associated with her chest discomfort. Her symptoms could be related to a viral URI syndrome, now improved  #4 hypothyroidism continue Synthroid  Discharge Exam:  Blood pressure 131/92, pulse 82, temperature 98.1 F (36.7 C), temperature source Oral, resp. rate 20, height 5\' 7"  (1.702 m), weight 93.4 kg (205 lb 14.6 oz), SpO2 97.00%.  HENT:  Head: Normocephalic and atraumatic.  Right Ear: External ear normal.  Left Ear: External ear normal.  Nose: Nose normal.  Mouth/Throat: Oropharynx is clear and moist. No oropharyngeal exudate.  Eyes: Conjunctivae normal are normal. Pupils are equal, round, and reactive to light. Right eye exhibits no discharge. Left eye exhibits no discharge. No scleral icterus.  Neck: Normal range of motion. Neck supple.  Cardiovascular: Normal rate and regular rhythm.  Respiratory: Effort normal and breath sounds normal. No respiratory distress. She has no wheezes. She has no rales.  GI: Soft. Bowel sounds are normal. She exhibits no distension. There is no tenderness. There is no rebound.    Musculoskeletal: Normal range of motion. She exhibits no edema and no tenderness.  Neurological: She is alert and oriented to person, place, and time.  Moves all extremities.  Skin: Skin is warm and dry. She is not diaphoretic.           Follow-up Information    Follow up with Beverley Fiedler, MD. Schedule an appointment as soon as possible for a visit in 1 week. (PCP to arrange for outpatient stress testing)    Contact information:   [address] [place] [address] [phone]          Signed: Richarda Overlie 04/29/2012, 9:20 AM

## 2012-05-31 ENCOUNTER — Emergency Department (INDEPENDENT_AMBULATORY_CARE_PROVIDER_SITE_OTHER)
Admission: EM | Admit: 2012-05-31 | Discharge: 2012-05-31 | Disposition: A | Discharge status: Home / Self Care | Payer: BC Managed Care – PPO | Source: Home / Self Care | Attending: Family Medicine | Admitting: Family Medicine

## 2012-05-31 ENCOUNTER — Encounter (HOSPITAL_COMMUNITY): Payer: Self-pay | Admitting: Emergency Medicine

## 2012-05-31 DIAGNOSIS — S46919A Strain of unspecified muscle, fascia and tendon at shoulder and upper arm level, unspecified arm, initial encounter: Secondary | ICD-10-CM

## 2012-05-31 DIAGNOSIS — IMO0002 Reserved for concepts with insufficient information to code with codable children: Secondary | ICD-10-CM

## 2012-05-31 MED ORDER — CYCLOBENZAPRINE HCL 5 MG PO TABS: 5.0000 mg | ORAL_TABLET | Freq: Three times a day (TID) | ORAL | Status: DC | PRN

## 2012-05-31 MED ORDER — TRAMADOL HCL 50 MG PO TABS: 50.0000 mg | ORAL_TABLET | Freq: Four times a day (QID) | ORAL | Status: DC | PRN

## 2012-05-31 NOTE — ED Notes (Signed)
Delay in triaging secondary to department acuity 

## 2012-05-31 NOTE — ED Notes (Signed)
Right arm pain for 4 days , no known injury

## 2012-05-31 NOTE — ED Provider Notes (Signed)
History     CSN: [id number]  Arrival date & time 05/31/12  1521   First MD Initiated Contact with Patient 05/31/12 1702      No chief complaint on file.   (Consider location/radiation/quality/duration/timing/severity/associated sxs/prior treatment) Patient is a 41 y.o. female presenting with back pain. The history is provided by the patient.  Back Pain  This is a new problem. The current episode started more than 2 days ago. The problem has not changed since onset.The pain is associated with no known injury. The quality of the pain is described as shooting. The pain does not radiate. The pain is mild. Pertinent negatives include no chest pain, no fever, no paresthesias, no paresis and no tingling.    Past Medical History  Diagnosis Date  . Allergy   . Hypertension   . IBS (irritable bowel syndrome)   . Nephropathy   . Hyperlipemia   . Anxiety   . GERD (gastroesophageal reflux disease)   . Chronic constipation     Past Surgical History  Procedure Date  . Abdominal hysterectomy 1998  . Cholecystectomy 1997  . Esophagogastroduodenoscopy   . Tubal ligation     Family History  Problem Relation Age of Onset  . Hypertension Father   . Coronary artery disease Father     History  Substance Use Topics  . Smoking status: Never Smoker   . Smokeless tobacco: Never Used  . Alcohol Use: No    OB History    Grav Para Term Preterm Abortions TAB SAB Ect Mult Living                  Review of Systems  Constitutional: Negative for fever.  Cardiovascular: Negative for chest pain.  Musculoskeletal: Positive for back pain. Negative for joint swelling and gait problem.  Neurological: Negative for tingling and paresthesias.    Allergies  Aspirin; Food; Tramadol; and Tylenol  Home Medications   Current Outpatient Rx  Name Route Sig Dispense Refill  . AMLODIPINE BESYLATE 5 MG PO TABS Oral Take 5 mg by mouth daily.    . CYCLOBENZAPRINE HCL 5 MG PO TABS Oral Take 1 tablet  (5 mg total) by mouth 3 (three) times daily as needed for muscle spasms. 30 tablet 0  . DESLORATADINE 5 MG PO TABS Oral Take 5 mg by mouth daily.    Marland Kitchen DIPHENHYDRAMINE HCL 25 MG PO CAPS Oral Take 1 capsule (25 mg total) by mouth every 6 (six) hours as needed for itching. 20 capsule 0  . DOCUSATE SODIUM 100 MG PO CAPS Oral Take 100 mg by mouth daily.     Marland Kitchen ESOMEPRAZOLE MAGNESIUM 40 MG PO CPDR Oral Take 40 mg by mouth daily before breakfast.    . LEVOTHYROXINE SODIUM 137 MCG PO TABS Oral Take 137 mcg by mouth daily.    Marland Kitchen METOPROLOL SUCCINATE ER 50 MG PO TB24 Oral Take 50 mg by mouth daily. Take with or immediately following a meal.    . ONE-DAILY MULTI VITAMINS PO TABS Oral Take 1 tablet by mouth daily.      Marland Kitchen NITROGLYCERIN 0.3 MG SL SUBL Sublingual Place 1 tablet (0.3 mg total) under the tongue every 5 (five) minutes as needed for chest pain. 90 tablet 12  . QUINAPRIL HCL 40 MG PO TABS Oral Take 40 mg by mouth every evening.     Marland Kitchen TRAMADOL HCL 50 MG PO TABS Oral Take 1 tablet (50 mg total) by mouth every 6 (six) hours as needed for  pain. 20 tablet 0  . VALSARTAN 160 MG PO TABS Oral Take 160 mg by mouth daily.      BP 152/101  Pulse 68  Temp 98.7 F (37.1 C) (Oral)  Resp 18  SpO2 99%  Physical Exam  Nursing note and vitals reviewed. Constitutional: She is oriented to person, place, and time. She appears well-developed and well-nourished.  Pulmonary/Chest: Breath sounds normal.  Musculoskeletal: She exhibits tenderness.       Arms: Neurological: She is alert and oriented to person, place, and time.  Skin: Skin is warm and dry.    ED Course  Procedures (including critical care time)  Labs Reviewed - No data to display No results found.   1. Muscle strain of scapular region       MDM          Linna Hoff, MD 05/31/12 1723

## 2012-10-27 ENCOUNTER — Encounter: Payer: Self-pay | Admitting: Internal Medicine

## 2012-10-27 ENCOUNTER — Ambulatory Visit (INDEPENDENT_AMBULATORY_CARE_PROVIDER_SITE_OTHER): Payer: BC Managed Care – PPO | Admitting: Internal Medicine

## 2012-10-27 VITALS — BP 150/100 | HR 70 | Temp 98.4°F | Resp 20 | Wt 210.0 lb

## 2012-10-27 DIAGNOSIS — E039 Hypothyroidism, unspecified: Secondary | ICD-10-CM

## 2012-10-27 DIAGNOSIS — J309 Allergic rhinitis, unspecified: Secondary | ICD-10-CM

## 2012-10-27 DIAGNOSIS — E785 Hyperlipidemia, unspecified: Secondary | ICD-10-CM

## 2012-10-27 DIAGNOSIS — N289 Disorder of kidney and ureter, unspecified: Secondary | ICD-10-CM

## 2012-10-27 DIAGNOSIS — I10 Essential (primary) hypertension: Secondary | ICD-10-CM

## 2012-10-27 MED ORDER — HYDROCODONE-HOMATROPINE 5-1.5 MG/5ML PO SYRP: 5.0000 mL | ORAL_SOLUTION | Freq: Four times a day (QID) | ORAL | Status: DC | PRN

## 2012-10-27 MED ORDER — LEVOTHYROXINE SODIUM 150 MCG PO TABS: 150.0000 ug | ORAL_TABLET | Freq: Every day | ORAL | Status: DC

## 2012-10-27 NOTE — Progress Notes (Signed)
  Subjective:    Patient ID: Rhonda [name], female    DOB: 1971/02/27, 42 y.o.   MRN: [id number]  HPI  42 year old patient who history of hypertension. She is seen after a two-year absence and has been followed with [facility] family [facility]. She has a history of allergic rhinitis and also hypothyroidism. Her levothyroxine dose has recently been up titrated to 150 mcg daily. She presents with a two-week history of  subacute illness. This began with some GI symptoms that 2 weeks ago which have resolved more recently she has persistent dizziness cough and rhinorrhea. This alternates with sinus congestion. She is sensitive to tramadol and acetaminophen. Her chief complaint is nocturnal cough. Blood pressure regimen does include both ARB and ACE inhibitor. She is on this combination for blood pressure control as well as renal protection by nephrology    Review of Systems  Constitutional: Positive for fatigue.  HENT: Positive for congestion and rhinorrhea. Negative for hearing loss, sore throat, dental problem, sinus pressure and tinnitus.   Eyes: Negative for pain, discharge and visual disturbance.  Respiratory: Positive for cough. Negative for shortness of breath.   Cardiovascular: Negative for chest pain, palpitations and leg swelling.  Gastrointestinal: Negative for nausea, vomiting, abdominal pain, diarrhea, constipation, blood in stool and abdominal distention.  Genitourinary: Negative for dysuria, urgency, frequency, hematuria, flank pain, vaginal bleeding, vaginal discharge, difficulty urinating, vaginal pain and pelvic pain.  Musculoskeletal: Negative for joint swelling, arthralgias and gait problem.  Skin: Negative for rash.  Neurological: Negative for dizziness, syncope, speech difficulty, weakness, numbness and headaches.  Hematological: Negative for adenopathy.  Psychiatric/Behavioral: Negative for behavioral problems, dysphoric mood and agitation. The patient is not nervous/anxious.        Objective:   Physical Exam  Constitutional: She is oriented to person, place, and time. She appears well-developed and well-nourished.  150/90  HENT:  Head: Normocephalic.  Right Ear: External ear normal.  Left Ear: External ear normal.  Mouth/Throat: Oropharynx is clear and moist.  Eyes: Conjunctivae and EOM are normal. Pupils are equal, round, and reactive to light.  Neck: Normal range of motion. Neck supple. No thyromegaly present.  Cardiovascular: Normal rate, regular rhythm, normal heart sounds and intact distal pulses.   Pulmonary/Chest: Effort normal and breath sounds normal.  Abdominal: Soft. Bowel sounds are normal. She exhibits no mass. There is no tenderness.  Musculoskeletal: Normal range of motion.  Lymphadenopathy:    She has no cervical adenopathy.  Neurological: She is alert and oriented to person, place, and time.  Skin: Skin is warm and dry. No rash noted.  Psychiatric: She has a normal mood and affect. Her behavior is normal.          Assessment & Plan:   Viral URI with cough. We'll treat symptomatically Hypothyroidism. We'll check TSH next visit. The patient has recently been up titrated to 150 mcg daily Hypertension unclear control. Home blood pressure monitoring encouraged we'll recheck in 4 weeks

## 2012-10-27 NOTE — Patient Instructions (Signed)
Use saline irrigation, warm  moist compresses and over-the-counter decongestants only as directed.  Call if there is no improvement in 5 to 7 days, or sooner if you develop increasing pain, fever, or any new symptoms.  Please check your blood pressure on a regular basis.  If it is consistently greater than 150/90, please make an office appointment.

## 2012-10-31 ENCOUNTER — Encounter (HOSPITAL_COMMUNITY): Payer: Self-pay

## 2012-10-31 ENCOUNTER — Emergency Department (HOSPITAL_COMMUNITY)
Admission: EM | Admit: 2012-10-31 | Discharge: 2012-10-31 | Disposition: A | Discharge status: Home / Self Care | Payer: BC Managed Care – PPO | Attending: Emergency Medicine | Admitting: Emergency Medicine

## 2012-10-31 ENCOUNTER — Emergency Department (HOSPITAL_COMMUNITY): Payer: BC Managed Care – PPO

## 2012-10-31 DIAGNOSIS — I10 Essential (primary) hypertension: Secondary | ICD-10-CM | POA: Insufficient documentation

## 2012-10-31 DIAGNOSIS — K5900 Constipation, unspecified: Secondary | ICD-10-CM | POA: Insufficient documentation

## 2012-10-31 DIAGNOSIS — R079 Chest pain, unspecified: Secondary | ICD-10-CM | POA: Insufficient documentation

## 2012-10-31 DIAGNOSIS — Z8639 Personal history of other endocrine, nutritional and metabolic disease: Secondary | ICD-10-CM | POA: Insufficient documentation

## 2012-10-31 DIAGNOSIS — Z87448 Personal history of other diseases of urinary system: Secondary | ICD-10-CM | POA: Insufficient documentation

## 2012-10-31 DIAGNOSIS — M549 Dorsalgia, unspecified: Secondary | ICD-10-CM | POA: Insufficient documentation

## 2012-10-31 DIAGNOSIS — K219 Gastro-esophageal reflux disease without esophagitis: Secondary | ICD-10-CM | POA: Insufficient documentation

## 2012-10-31 DIAGNOSIS — Z8659 Personal history of other mental and behavioral disorders: Secondary | ICD-10-CM | POA: Insufficient documentation

## 2012-10-31 DIAGNOSIS — Z8719 Personal history of other diseases of the digestive system: Secondary | ICD-10-CM | POA: Insufficient documentation

## 2012-10-31 DIAGNOSIS — R110 Nausea: Secondary | ICD-10-CM | POA: Insufficient documentation

## 2012-10-31 DIAGNOSIS — Z862 Personal history of diseases of the blood and blood-forming organs and certain disorders involving the immune mechanism: Secondary | ICD-10-CM | POA: Insufficient documentation

## 2012-10-31 DIAGNOSIS — R209 Unspecified disturbances of skin sensation: Secondary | ICD-10-CM | POA: Insufficient documentation

## 2012-10-31 DIAGNOSIS — Z79899 Other long term (current) drug therapy: Secondary | ICD-10-CM | POA: Insufficient documentation

## 2012-10-31 LAB — CBC
MCH: 28.9 pg (ref 26.0–34.0)
MCHC: 33.2 g/dL (ref 30.0–36.0)
Platelets: 298 10*3/uL (ref 150–400)
RDW: 13.4 % (ref 11.5–15.5)

## 2012-10-31 LAB — COMPREHENSIVE METABOLIC PANEL
ALT: 20 U/L (ref 0–35)
Albumin: 3.8 g/dL (ref 3.5–5.2)
Calcium: 9.4 mg/dL (ref 8.4–10.5)
GFR calc Af Amer: 64 mL/min — ABNORMAL LOW (ref >90)
Glucose, Bld: 87 mg/dL (ref 70–99)
Sodium: 135 mEq/L (ref 135–145)
Total Protein: 8.3 g/dL (ref 6.0–8.3)

## 2012-10-31 LAB — LIPASE, BLOOD: Lipase: 42 U/L (ref 11–59)

## 2012-10-31 NOTE — ED Provider Notes (Signed)
History     CSN: [id number]  Arrival date & time 10/31/12  1531   First MD Initiated Contact with Patient 10/31/12 1714      Chief Complaint  Patient presents with  . Chest Pain    (Consider location/radiation/quality/duration/timing/severity/associated sxs/prior treatment) HPI Comments: Patient is a 42 year old female with a history of hypertension who presents for gradually worsening chest pain and pressure x3 days. Patient states that her chest pain started Saturday morning, has been intermittent, nonradiating, and lasting 1-2 minutes before subsiding. Patient states that she was at work today when her chest pain and pressure came on suddenly and more intense than it had over the past 2 days, but lasting about the same length of time. Patient states that she is also experiencing discomfort in her back and between her shoulder blades, but she states that this is not pain radiating from her chest. Patient admits to taking all of her hypertension medications and denies aggravating or alleviating factors. Patient admits to associated nausea with no episodes of vomiting. She also has had left arm numbness that comes and goes. She denies headache, vision changes, shortness of breath, abdominal pain, urinary symptoms, and tingling or weakness in her upper and lower extremities. Patient has a history of reflux and states that this pain is different than the reflux symptoms she has felt in the past.  Patient is a 42 y.o. female presenting with chest pain.  Chest Pain Associated symptoms: back pain, nausea and numbness   Associated symptoms: no abdominal pain, no dysphagia, no fever, no headache, no shortness of breath, not vomiting and no weakness     Past Medical History  Diagnosis Date  . Allergy   . Hypertension   . IBS (irritable bowel syndrome)   . Nephropathy   . Hyperlipemia   . Anxiety   . GERD (gastroesophageal reflux disease)   . Chronic constipation     Past Surgical History   Procedure Laterality Date  . Abdominal hysterectomy  1998  . Cholecystectomy  1997  . Esophagogastroduodenoscopy    . Tubal ligation      Family History  Problem Relation Age of Onset  . Hypertension Father   . Coronary artery disease Father     History  Substance Use Topics  . Smoking status: Never Smoker   . Smokeless tobacco: Never Used  . Alcohol Use: No    OB History   Grav Para Term Preterm Abortions TAB SAB Ect Mult Living                  Review of Systems  Constitutional: Negative for fever and chills.  HENT: Negative for trouble swallowing, neck pain and neck stiffness.   Eyes: Negative for visual disturbance.  Respiratory: Negative for chest tightness and shortness of breath.   Cardiovascular: Positive for chest pain.  Gastrointestinal: Positive for nausea. Negative for vomiting, abdominal pain and diarrhea.  Genitourinary: Negative for dysuria, urgency and hematuria.  Musculoskeletal: Positive for back pain.  Skin: Negative for color change.  Neurological: Positive for numbness. Negative for syncope, weakness and headaches.  All other systems reviewed and are negative.    Allergies  Aspirin; Food; Tramadol; and Tylenol  Home Medications   Current Outpatient Rx  Name  Route  Sig  Dispense  Refill  . amLODipine (NORVASC) 5 MG tablet   Oral   Take 5 mg by mouth daily.         Marland Kitchen desloratadine (CLARINEX) 5 MG tablet  Oral   Take 5 mg by mouth daily.         Marland Kitchen docusate sodium (COLACE) 100 MG capsule   Oral   Take 100 mg by mouth daily.          Marland Kitchen esomeprazole (NEXIUM) 40 MG capsule   Oral   Take 40 mg by mouth daily before breakfast.         . levothyroxine (SYNTHROID, LEVOTHROID) 150 MCG tablet   Oral   Take 1 tablet (150 mcg total) by mouth daily.   90 tablet   6   . metoprolol succinate (TOPROL-XL) 50 MG 24 hr tablet   Oral   Take 50 mg by mouth daily. Take with or immediately following a meal.         . Multiple Vitamin  (MULTIVITAMIN) tablet   Oral   Take 1 tablet by mouth daily.           . quinapril (ACCUPRIL) 40 MG tablet   Oral   Take 40 mg by mouth daily.          . valsartan (DIOVAN) 160 MG tablet   Oral   Take 160 mg by mouth daily.           BP 137/93  Pulse 87  Temp(Src) 98.5 F (36.9 C) (Oral)  Resp 16  SpO2 100%  Physical Exam  Nursing note and vitals reviewed. Constitutional: She is oriented to person, place, and time. She appears well-developed and well-nourished. No distress.  HENT:  Head: Normocephalic and atraumatic.  Mouth/Throat: Oropharynx is clear and moist. No oropharyngeal exudate.  Eyes: Conjunctivae are normal. Pupils are equal, round, and reactive to light. No scleral icterus.  Neck: Normal range of motion. Neck supple.  Cardiovascular: Normal rate, regular rhythm, normal heart sounds and intact distal pulses.   Pulmonary/Chest: Effort normal and breath sounds normal. No respiratory distress. She has no wheezes. She has no rales.  Abdominal: Soft. Bowel sounds are normal. She exhibits no distension and no mass. There is no tenderness. There is no rebound and no guarding.  Musculoskeletal: Normal range of motion. She exhibits no edema.  Neurological: She is alert and oriented to person, place, and time.  Skin: Skin is warm and dry. She is not diaphoretic. No erythema.  Psychiatric: She has a normal mood and affect. Her behavior is normal.    ED Course  Procedures (including critical care time)  Labs Reviewed  COMPREHENSIVE METABOLIC PANEL - Abnormal; Notable for the following:    Creatinine, Ser 1.18 (*)    Total Bilirubin 0.2 (*)    GFR calc non Af Amer 56 (*)    GFR calc Af Amer 64 (*)    All other components within normal limits  CBC  LIPASE, BLOOD  POCT I-STAT TROPONIN I   Dg Chest 2 View  10/31/2012  *RADIOLOGY REPORT*  Clinical Data: Shortness of breath.  Current history of hypertension.  CHEST - 2 VIEW  Comparison: Two-view chest x-ray  04/28/2012, 03/12/2012.  CTA chest 03/12/2012.  Findings: Cardiac silhouette upper normal in size for the AP technique, unchanged.  Hilar and mediastinal contours unremarkable. Linear scarring in the right middle lobe, unchanged.  Lungs otherwise clear.  No pleural effusions.  Surgical clips in the left side of the upper mediastinum.  Visualized bony thorax intact.  IMPRESSION: No acute cardiopulmonary disease.   Original Report Authenticated By: Hulan Saas, M.D.      Date: 10/31/2012  Rate: 76  Rhythm: normal  sinus rhythm  QRS Axis: normal  Intervals: normal  ST/T Wave abnormalities: borderline T wave abnormalities  Conduction Disutrbances:none  Narrative Interpretation: NSR with nonspecific T wave changes unchanged from prior; no STEMI  Old EKG Reviewed: unchanged from 03/2012   1. Chest pain      MDM  Patient presents for evaluation of central chest pain that has been gradually worsening over the last 3 days and intermittent. Patient's basic labs consistent with prior workup and troponin level 0; no evidence of acute cardiopulmonary disease on chest x-ray and EKG unchanged from prior on 03/2012. Lipase normal. Low suspicion that symptoms are related to ACS based on his workup. Suspicion for pulmonary embolism very low as patient satting 100% on room air, afebrile, and not tachycardic. She is also not complaining of any shortness of breath. Patient will be discharged with primary care followup for further evaluation of her symptoms. Patient ED workup and management discussed with Dr. Effie Shy who is in agreement.  Filed Vitals:   10/31/12 1545  BP: 137/93  Pulse: 87  Temp: 98.5 F (36.9 C)  TempSrc: Oral  Resp: 16  SpO2: 100%           Antony Madura, PA-C 11/01/12 1455

## 2012-10-31 NOTE — ED Notes (Signed)
Patient reports that she began having central chest pain that has been intermittent for the past 2 days. Pain radiates to upper back, bilateral shoulders and left arm. Patient states the CP has been associated with nausea also.

## 2012-11-01 NOTE — ED Provider Notes (Signed)
Medical screening examination/treatment/procedure(s) were performed by non-physician practitioner and as supervising physician I was immediately available for consultation/collaboration.  Flint Melter, MD 11/01/12 1630

## 2012-11-07 ENCOUNTER — Other Ambulatory Visit: Payer: Self-pay | Admitting: Internal Medicine

## 2012-11-07 DIAGNOSIS — Z1231 Encounter for screening mammogram for malignant neoplasm of breast: Secondary | ICD-10-CM

## 2012-11-09 ENCOUNTER — Ambulatory Visit (HOSPITAL_COMMUNITY)
Admission: RE | Admit: 2012-11-09 | Discharge: 2012-11-09 | Disposition: A | Discharge status: Home / Self Care | Payer: BC Managed Care – PPO | Source: Ambulatory Visit | Attending: Internal Medicine | Admitting: Internal Medicine

## 2012-11-09 DIAGNOSIS — Z1231 Encounter for screening mammogram for malignant neoplasm of breast: Secondary | ICD-10-CM | POA: Insufficient documentation

## 2012-11-29 ENCOUNTER — Ambulatory Visit (INDEPENDENT_AMBULATORY_CARE_PROVIDER_SITE_OTHER): Payer: BC Managed Care – PPO | Admitting: Internal Medicine

## 2012-11-29 ENCOUNTER — Encounter: Payer: Self-pay | Admitting: Internal Medicine

## 2012-11-29 VITALS — BP 142/94 | HR 74 | Temp 98.7°F | Resp 18 | Wt 214.0 lb

## 2012-11-29 DIAGNOSIS — N289 Disorder of kidney and ureter, unspecified: Secondary | ICD-10-CM

## 2012-11-29 DIAGNOSIS — I10 Essential (primary) hypertension: Secondary | ICD-10-CM

## 2012-11-29 DIAGNOSIS — E039 Hypothyroidism, unspecified: Secondary | ICD-10-CM

## 2012-11-29 LAB — TSH: TSH: 1.6 u[IU]/mL (ref 0.35–5.50)

## 2012-11-29 NOTE — Progress Notes (Signed)
Subjective:    Patient ID: Rhonda [name], female    DOB: February 25, 1971, 42 y.o.   MRN: [id number]  HPI  42 year old patient who is seen today for followup. She has 2 to hypertension as well as renal disease. She is on dual  ACE/ARB for renal protection and blood pressure control. Is also on amlodipine. She is doing quite well today without concerns or complaints. She has treated hypothyroidism. Blood pressure readings at home are usually in a low-normal range (wrist cuff)  Past Medical History  Diagnosis Date  . Allergy   . Hypertension   . IBS (irritable bowel syndrome)   . Nephropathy   . Hyperlipemia   . Anxiety   . GERD (gastroesophageal reflux disease)   . Chronic constipation     History   Social History  . Marital Status: Divorced    Spouse Name: N/A    Number of Children: 2  . Years of Education: N/A   Occupational History  . NURSING ASSISTANT    Social History Main Topics  . Smoking status: Never Smoker   . Smokeless tobacco: Never Used  . Alcohol Use: No  . Drug Use: No  . Sexually Active: Not on file   Other Topics Concern  . Not on file   Social History Narrative  . No narrative on file    Past Surgical History  Procedure Laterality Date  . Abdominal hysterectomy  1998  . Cholecystectomy  1997  . Esophagogastroduodenoscopy    . Tubal ligation      Family History  Problem Relation Age of Onset  . Hypertension Father   . Coronary artery disease Father     Allergies  Allergen Reactions  . Aspirin Hives  . Food Other (See Comments)    Lima beans  . Tramadol Other (See Comments)    Headache   . Tylenol (Acetaminophen) Other (See Comments)    Irritates tongue     Current Outpatient Prescriptions on File Prior to Visit  Medication Sig Dispense Refill  . amLODipine (NORVASC) 5 MG tablet Take 5 mg by mouth daily.      Marland Kitchen desloratadine (CLARINEX) 5 MG tablet Take 5 mg by mouth daily.      Marland Kitchen docusate sodium (COLACE) 100 MG capsule Take 100 mg by  mouth daily.       Marland Kitchen esomeprazole (NEXIUM) 40 MG capsule Take 40 mg by mouth daily before breakfast.      . levothyroxine (SYNTHROID, LEVOTHROID) 150 MCG tablet Take 1 tablet (150 mcg total) by mouth daily.  90 tablet  6  . metoprolol succinate (TOPROL-XL) 50 MG 24 hr tablet Take 50 mg by mouth daily. Take with or immediately following a meal.      . Multiple Vitamin (MULTIVITAMIN) tablet Take 1 tablet by mouth daily.        . quinapril (ACCUPRIL) 40 MG tablet Take 40 mg by mouth daily.       . valsartan (DIOVAN) 160 MG tablet Take 160 mg by mouth daily.       Current Facility-Administered Medications on File Prior to Visit  Medication Dose Route Frequency Provider Last Rate Last Dose  . 0.9 %  sodium chloride infusion  500 mL Intravenous Continuous Louis Meckel, MD        BP 142/94  Pulse 74  Temp(Src) 98.7 F (37.1 C) (Oral)  Resp 18  Wt 214 lb (97.07 kg)  BMI 33.51 kg/m2  SpO2 96%       Review  of Systems  Constitutional: Negative.   HENT: Negative for hearing loss, congestion, sore throat, rhinorrhea, dental problem, sinus pressure and tinnitus.   Eyes: Negative for pain, discharge and visual disturbance.  Respiratory: Negative for cough and shortness of breath.   Cardiovascular: Negative for chest pain, palpitations and leg swelling.  Gastrointestinal: Negative for nausea, vomiting, abdominal pain, diarrhea, constipation, blood in stool and abdominal distention.  Genitourinary: Negative for dysuria, urgency, frequency, hematuria, flank pain, vaginal bleeding, vaginal discharge, difficulty urinating, vaginal pain and pelvic pain.  Musculoskeletal: Negative for joint swelling, arthralgias and gait problem.  Skin: Negative for rash.  Neurological: Negative for dizziness, syncope, speech difficulty, weakness, numbness and headaches.  Hematological: Negative for adenopathy.  Psychiatric/Behavioral: Negative for behavioral problems, dysphoric mood and agitation. The patient is  not nervous/anxious.        Objective:   Physical Exam  Constitutional: She is oriented to person, place, and time. She appears well-developed and well-nourished.  Blood pressure 124/86  HENT:  Head: Normocephalic.  Right Ear: External ear normal.  Left Ear: External ear normal.  Mouth/Throat: Oropharynx is clear and moist.  Eyes: Conjunctivae and EOM are normal. Pupils are equal, round, and reactive to light.  Neck: Normal range of motion. Neck supple. No thyromegaly present.  Cardiovascular: Normal rate, regular rhythm, normal heart sounds and intact distal pulses.   Pulmonary/Chest: Effort normal and breath sounds normal.  Abdominal: Soft. Bowel sounds are normal. She exhibits no mass. There is no tenderness.  Musculoskeletal: Normal range of motion.  Lymphadenopathy:    She has no cervical adenopathy.  Neurological: She is alert and oriented to person, place, and time.  Skin: Skin is warm and dry. No rash noted.  Psychiatric: She has a normal mood and affect. Her behavior is normal.          Assessment & Plan:   Hypertension. Home blood pressure readings have been well-controlled using a low-normal range. Repeat blood pressure today 124/86 Hypothyroidism. We'll check a TSH Nephropathy. Followup renal medicine   Recheck here 6 months

## 2012-11-29 NOTE — Patient Instructions (Signed)
Limit your sodium (Salt) intake  Please check your blood pressure on a regular basis.  If it is consistently greater than 150/90, please make an office appointment.    It is important that you exercise regularly, at least 20 minutes 3 to 4 times per week.  If you develop chest pain or shortness of breath seek  medical attention.  Followup renal medicine

## 2012-12-11 ENCOUNTER — Emergency Department (HOSPITAL_COMMUNITY): Payer: BC Managed Care – PPO

## 2012-12-11 ENCOUNTER — Emergency Department (HOSPITAL_COMMUNITY)
Admission: EM | Admit: 2012-12-11 | Discharge: 2012-12-12 | Disposition: A | Discharge status: Home / Self Care | Payer: BC Managed Care – PPO | Attending: Emergency Medicine | Admitting: Emergency Medicine

## 2012-12-11 ENCOUNTER — Encounter (HOSPITAL_COMMUNITY): Payer: Self-pay

## 2012-12-11 DIAGNOSIS — E785 Hyperlipidemia, unspecified: Secondary | ICD-10-CM | POA: Insufficient documentation

## 2012-12-11 DIAGNOSIS — Z79899 Other long term (current) drug therapy: Secondary | ICD-10-CM | POA: Insufficient documentation

## 2012-12-11 DIAGNOSIS — K219 Gastro-esophageal reflux disease without esophagitis: Secondary | ICD-10-CM | POA: Insufficient documentation

## 2012-12-11 DIAGNOSIS — K589 Irritable bowel syndrome without diarrhea: Secondary | ICD-10-CM | POA: Insufficient documentation

## 2012-12-11 DIAGNOSIS — Z8659 Personal history of other mental and behavioral disorders: Secondary | ICD-10-CM | POA: Insufficient documentation

## 2012-12-11 DIAGNOSIS — N289 Disorder of kidney and ureter, unspecified: Secondary | ICD-10-CM | POA: Insufficient documentation

## 2012-12-11 DIAGNOSIS — R05 Cough: Secondary | ICD-10-CM | POA: Insufficient documentation

## 2012-12-11 DIAGNOSIS — R509 Fever, unspecified: Secondary | ICD-10-CM

## 2012-12-11 DIAGNOSIS — R059 Cough, unspecified: Secondary | ICD-10-CM | POA: Insufficient documentation

## 2012-12-11 DIAGNOSIS — I10 Essential (primary) hypertension: Secondary | ICD-10-CM | POA: Insufficient documentation

## 2012-12-11 DIAGNOSIS — K5909 Other constipation: Secondary | ICD-10-CM | POA: Insufficient documentation

## 2012-12-11 DIAGNOSIS — J40 Bronchitis, not specified as acute or chronic: Secondary | ICD-10-CM

## 2012-12-11 MED ORDER — ACETAMINOPHEN 325 MG PO TABS
650.0000 mg | ORAL_TABLET | Freq: Once | ORAL | Status: AC
  Administered 2012-12-11: 650 mg via ORAL
  Filled 2012-12-11: qty 2

## 2012-12-11 NOTE — ED Provider Notes (Signed)
History     CSN: [id number]  Arrival date & time 12/11/12  1944   First MD Initiated Contact with Patient 12/11/12 2102      Chief Complaint  Patient presents with  . Fever  . Cough    (Consider location/radiation/quality/duration/timing/severity/associated sxs/prior treatment) HPI Comments: Gwenivere Hiraldo is a 42 y.o. female who presents for evaluation of cough. She has been ill for 2 days with cough. Fever, rhinorrhea, right ear pain, sinus pain, and nasal congestion. She did not try any medication for the fever. She has not had any nausea or vomiting. No near syncope. There are no known modifying factors.  Patient is a 42 y.o. female presenting with fever and cough. The history is provided by the patient.  Fever Associated symptoms: cough   Cough Associated symptoms: fever     Past Medical History  Diagnosis Date  . Allergy   . Hypertension   . IBS (irritable bowel syndrome)   . Nephropathy   . Hyperlipemia   . Anxiety   . GERD (gastroesophageal reflux disease)   . Chronic constipation     Past Surgical History  Procedure Laterality Date  . Abdominal hysterectomy  1998  . Cholecystectomy  1997  . Esophagogastroduodenoscopy    . Tubal ligation      Family History  Problem Relation Age of Onset  . Hypertension Father   . Coronary artery disease Father     History  Substance Use Topics  . Smoking status: Never Smoker   . Smokeless tobacco: Never Used  . Alcohol Use: No    OB History   Grav Para Term Preterm Abortions TAB SAB Ect Mult Living                  Review of Systems  Constitutional: Positive for fever.  Respiratory: Positive for cough.   All other systems reviewed and are negative.    Allergies  Aspirin; Food; Tramadol; and Tylenol  Home Medications   Current Outpatient Rx  Name  Route  Sig  Dispense  Refill  . amLODipine (NORVASC) 5 MG tablet   Oral   Take 5 mg by mouth daily.         Marland Kitchen desloratadine (CLARINEX) 5 MG  tablet   Oral   Take 5 mg by mouth daily.         Marland Kitchen docusate sodium (COLACE) 100 MG capsule   Oral   Take 100 mg by mouth daily.          Marland Kitchen esomeprazole (NEXIUM) 40 MG capsule   Oral   Take 40 mg by mouth daily before breakfast.         . levothyroxine (SYNTHROID, LEVOTHROID) 150 MCG tablet   Oral   Take 150 mcg by mouth daily before breakfast.         . metoprolol succinate (TOPROL-XL) 50 MG 24 hr tablet   Oral   Take 50 mg by mouth daily. Take with or immediately following a meal.         . Multiple Vitamin (MULTIVITAMIN) tablet   Oral   Take 1 tablet by mouth daily.           . quinapril (ACCUPRIL) 40 MG tablet   Oral   Take 40 mg by mouth daily.          . valsartan (DIOVAN) 160 MG tablet   Oral   Take 160 mg by mouth daily.         Marland Kitchen  azithromycin (ZITHROMAX Z-PAK) 250 MG tablet      Take 2 pills, day one, then one each day until gone   6 tablet   0     BP 123/88  Pulse 81  Temp(Src) 99.4 F (37.4 C) (Oral)  Resp 17  Ht 5' 6.5" (1.689 m)  Wt 214 lb (97.07 kg)  BMI 34.03 kg/m2  SpO2 96%  Physical Exam  Nursing note and vitals reviewed. Constitutional: She is oriented to person, place, and time. She appears well-developed and well-nourished.  HENT:  Head: Normocephalic and atraumatic.  Eyes: Conjunctivae and EOM are normal. Pupils are equal, round, and reactive to light.  Neck: Normal range of motion and phonation normal. Neck supple.  Cardiovascular: Normal rate, regular rhythm and intact distal pulses.   Pulmonary/Chest: Effort normal and breath sounds normal. No respiratory distress. She has no wheezes. She has no rales. She exhibits no tenderness.  Persistent cough, nonproductive.  Abdominal: Soft. She exhibits no distension. There is no tenderness. There is no guarding.  Musculoskeletal: Normal range of motion.  Neurological: She is alert and oriented to person, place, and time. She has normal strength. She exhibits normal muscle  tone.  Skin: Skin is warm and dry.  Psychiatric: She has a normal mood and affect. Her behavior is normal. Judgment and thought content normal.    ED Course  Procedures (including critical care time)      1. Bronchitis   2. Febrile illness       MDM  Evaluation consistent with bronchitis. Doubt any ACS, PE., pneumonia. Doubt metabolic instability, serious bacterial infection or impending vascular collapse; the patient is stable for discharge.  Nursing Notes Reviewed/ Care Coordinated, anHipd agree without changes. Applicable Imaging Reviewed.  Interpretation of Laboratory Data incorporated into ED treatment   Plan: Home Medications- Zithromax; Home Treatments- rest; Recommended follow up- PCP prn         Flint Melter, MD 12/14/12 0021

## 2012-12-11 NOTE — ED Notes (Signed)
Pt complains of a cough that started on Thursday and now she has chills, fever, and body aches

## 2012-12-12 MED ORDER — AZITHROMYCIN 250 MG PO TABS: ORAL_TABLET | ORAL | Status: DC

## 2012-12-13 ENCOUNTER — Telehealth: Payer: Self-pay | Admitting: Internal Medicine

## 2012-12-13 NOTE — Telephone Encounter (Signed)
Spoke to pt asked if I could help her? Pt stated someone already talked to her and she was seen in the ED and given antibiotics and was told by Triage to give the antibiotics time to work. Told pt okay if need anything to call. Pt verbalized understanding.

## 2012-12-13 NOTE — Telephone Encounter (Signed)
Patient called into the office today for Nurse triage concerning cough and congestion. Attempted to contact patient. Reached voicemail. Left message to call back for assistance if needed. Office phone number provided. NO [phone]

## 2012-12-13 NOTE — Telephone Encounter (Signed)
Patient Information:  Caller Name: [place]  Phone: [phone]  Patient: Rhonda, [name]  Gender: Female  DOB: 08/22/1970  Age: 42 Years  PCP: Eleonore Chiquito [facility])  Pregnant: No  Office Follow Up:  Does the office need to follow up with this patient?: No  Instructions For The Office: N/A  RN Note:  advised to continue taking Amoxicillin; informed Tariya that it can take 48-72hrs to see fever reduction/sxs relief on an antibiotic; will call back for worsening/continuing sxs  Symptoms  Reason For Call & Symptoms: seen at [facility] 12/11/12 and diagnosed with bronchitis and possibly the flu; no flu swab done; CXR normal; given rx for Amoxicillin; started taking 4/28; c/o bad cough; has a thick yellowish phlegm;  HA; pressure in eyes and rt ear; has still been having a fever; last this am; was 101.5  Reviewed Health History In EMR: Yes  Reviewed Medications In EMR: Yes  Reviewed Allergies In EMR: Yes  Reviewed Surgeries / Procedures: Yes  Date of Onset of Symptoms: 12/01/2012  Treatments Tried: Tylenol  Treatments Tried Worked: No OB / GYN:  LMP: Unknown  Guideline(s) Used:  Cough  Disposition Per Guideline:   Home Care  Reason For Disposition Reached:   Cough with no complications  Advice Given:  Avoid Tobacco Smoke:  Smoking or being exposed to smoke makes coughs much worse.  Prevent Dehydration:  Drink adequate liquids.  Coughing Spasms:  Drink warm fluids. Inhale warm mist (Reason: both relax the airway and loosen up the phlegm).  Suck on cough drops or hard candy to coat the irritated throat.  Call Back If:  You become worse.  Fever Medicines:  For fevers above 101 F (38.3 C) take either acetaminophen or ibuprofen.  Patient Will Follow Care Advice:  YES

## 2013-01-23 ENCOUNTER — Telehealth: Payer: Self-pay | Admitting: Internal Medicine

## 2013-01-23 MED ORDER — METOPROLOL SUCCINATE ER 50 MG PO TB24: 50.0000 mg | ORAL_TABLET | Freq: Every day | ORAL | Status: DC

## 2013-01-23 MED ORDER — QUINAPRIL HCL 40 MG PO TABS: 40.0000 mg | ORAL_TABLET | Freq: Every day | ORAL | Status: DC

## 2013-01-23 MED ORDER — ESOMEPRAZOLE MAGNESIUM 40 MG PO CPDR: 40.0000 mg | DELAYED_RELEASE_CAPSULE | Freq: Every day | ORAL | Status: DC

## 2013-01-23 MED ORDER — VALSARTAN 160 MG PO TABS: 160.0000 mg | ORAL_TABLET | Freq: Every day | ORAL | Status: DC

## 2013-01-23 MED ORDER — DESLORATADINE 5 MG PO TABS: 5.0000 mg | ORAL_TABLET | Freq: Every day | ORAL | Status: DC

## 2013-01-23 MED ORDER — LEVOTHYROXINE SODIUM 150 MCG PO TABS: 150.0000 ug | ORAL_TABLET | Freq: Every day | ORAL | Status: DC

## 2013-01-23 MED ORDER — AMLODIPINE BESYLATE 5 MG PO TABS: 5.0000 mg | ORAL_TABLET | Freq: Every day | ORAL | Status: DC

## 2013-01-23 NOTE — Telephone Encounter (Signed)
Left detailed message Rx's sent to pharmacy as requested. 

## 2013-01-23 NOTE — Telephone Encounter (Signed)
Pt requesting refills on all meds: amLODipine (NORVASC) 5 MG tablet desloratadine (CLARINEX) 5 MG tablet esomeprazole (NEXIUM) 40 MG capsule levothyroxine (SYNTHROID, LEVOTHROID) 150 MCG tablet metoprolol succinate (TOPROL-XL) 50 MG 24 hr tablet quinapril (ACCUPRIL) 40 MG tablet valsartan (DIOVAN) 160 MG tablet  [employer].  Pt states she wants GENERIC on every medication.  30-day supplies please - call pt if any issues.

## 2013-01-24 ENCOUNTER — Emergency Department (HOSPITAL_COMMUNITY): Payer: BC Managed Care – PPO

## 2013-01-24 ENCOUNTER — Encounter (HOSPITAL_COMMUNITY): Payer: Self-pay | Admitting: Emergency Medicine

## 2013-01-24 ENCOUNTER — Emergency Department (HOSPITAL_COMMUNITY)
Admission: EM | Admit: 2013-01-24 | Discharge: 2013-01-24 | Disposition: A | Discharge status: Home / Self Care | Payer: BC Managed Care – PPO | Attending: Emergency Medicine | Admitting: Emergency Medicine

## 2013-01-24 ENCOUNTER — Telehealth: Payer: Self-pay | Admitting: Internal Medicine

## 2013-01-24 DIAGNOSIS — Z8719 Personal history of other diseases of the digestive system: Secondary | ICD-10-CM | POA: Insufficient documentation

## 2013-01-24 DIAGNOSIS — R0789 Other chest pain: Secondary | ICD-10-CM | POA: Insufficient documentation

## 2013-01-24 DIAGNOSIS — F411 Generalized anxiety disorder: Secondary | ICD-10-CM | POA: Insufficient documentation

## 2013-01-24 DIAGNOSIS — K219 Gastro-esophageal reflux disease without esophagitis: Secondary | ICD-10-CM | POA: Insufficient documentation

## 2013-01-24 DIAGNOSIS — Z79899 Other long term (current) drug therapy: Secondary | ICD-10-CM | POA: Insufficient documentation

## 2013-01-24 DIAGNOSIS — Z862 Personal history of diseases of the blood and blood-forming organs and certain disorders involving the immune mechanism: Secondary | ICD-10-CM | POA: Insufficient documentation

## 2013-01-24 DIAGNOSIS — Z8639 Personal history of other endocrine, nutritional and metabolic disease: Secondary | ICD-10-CM | POA: Insufficient documentation

## 2013-01-24 DIAGNOSIS — R05 Cough: Secondary | ICD-10-CM | POA: Insufficient documentation

## 2013-01-24 DIAGNOSIS — K5900 Constipation, unspecified: Secondary | ICD-10-CM | POA: Insufficient documentation

## 2013-01-24 DIAGNOSIS — Z87448 Personal history of other diseases of urinary system: Secondary | ICD-10-CM | POA: Insufficient documentation

## 2013-01-24 DIAGNOSIS — R059 Cough, unspecified: Secondary | ICD-10-CM | POA: Insufficient documentation

## 2013-01-24 DIAGNOSIS — M549 Dorsalgia, unspecified: Secondary | ICD-10-CM | POA: Insufficient documentation

## 2013-01-24 DIAGNOSIS — I10 Essential (primary) hypertension: Secondary | ICD-10-CM | POA: Insufficient documentation

## 2013-01-24 DIAGNOSIS — R110 Nausea: Secondary | ICD-10-CM | POA: Insufficient documentation

## 2013-01-24 LAB — POCT I-STAT TROPONIN I: Troponin i, poc: 0.01 ng/mL (ref 0.00–0.08)

## 2013-01-24 LAB — BASIC METABOLIC PANEL
BUN: 15 mg/dL (ref 6–23)
Chloride: 104 mEq/L (ref 96–112)
GFR calc Af Amer: 75 mL/min — ABNORMAL LOW (ref >90)
GFR calc non Af Amer: 65 mL/min — ABNORMAL LOW (ref >90)
Potassium: 3.6 mEq/L (ref 3.5–5.1)
Sodium: 136 mEq/L (ref 135–145)

## 2013-01-24 LAB — CBC
HCT: 36.3 % (ref 36.0–46.0)
RDW: 13.5 % (ref 11.5–15.5)
WBC: 7.9 10*3/uL (ref 4.0–10.5)

## 2013-01-24 MED ORDER — HYDROCODONE-ACETAMINOPHEN 5-325 MG PO TABS: 1.0000 | ORAL_TABLET | ORAL | Status: DC | PRN

## 2013-01-24 MED ORDER — ONDANSETRON HCL 4 MG/2ML IJ SOLN
4.0000 mg | Freq: Once | INTRAMUSCULAR | Status: AC
  Administered 2013-01-24: 4 mg via INTRAVENOUS
  Filled 2013-01-24: qty 2

## 2013-01-24 MED ORDER — SODIUM CHLORIDE 0.9 % IV BOLUS (SEPSIS)
500.0000 mL | Freq: Once | INTRAVENOUS | Status: AC
  Administered 2013-01-24: 500 mL via INTRAVENOUS

## 2013-01-24 MED ORDER — ALBUTEROL SULFATE HFA 108 (90 BASE) MCG/ACT IN AERS: 1.0000 | INHALATION_SPRAY | Freq: Four times a day (QID) | RESPIRATORY_TRACT | Status: DC | PRN

## 2013-01-24 MED ORDER — MORPHINE SULFATE 4 MG/ML IJ SOLN
4.0000 mg | Freq: Once | INTRAMUSCULAR | Status: AC
  Administered 2013-01-24: 4 mg via INTRAVENOUS
  Filled 2013-01-24: qty 1

## 2013-01-24 NOTE — ED Notes (Signed)
Pt ambulates in hallway with no distress. Pt O2 sat did not go under 97%.

## 2013-01-24 NOTE — ED Notes (Signed)
Patient with intermittent chest pain that started last Thursday.  Denies SOB, but did feel lightheaded on Friday.  Patient has had some nausea today only, but no vomiting.  Pain is described as a pressure and radiates to back.  Patient has history of recent bronchitis in which she was treated with a Z-pack for one course.  Patient has continued to have a non-productive cough since last month.

## 2013-01-24 NOTE — ED Provider Notes (Signed)
History     CSN: [id number]  Arrival date & time 01/24/13  1815   First MD Initiated Contact with Patient 01/24/13 1834      Chief Complaint  Patient presents with  . Chest Pain    (Consider location/radiation/quality/duration/timing/severity/associated sxs/prior treatment) HPI   Patient is a 42 yo F PMHx significant for pulmonary embolism in 2008, hyperlipidemia, HTN presenting to the ED for intermittent chest pressure w/ radiation to back. Pt rates pain at worst 7/10 and 0/10 at best. Pt denies any aggravating or alleviating factors. Pt has had associated non-productive cough for over a month that she was evaluated for in the ED at that time w/ diagnosis of bronchitis. Pt also reporting nausea and three episodes of loose stool starting today. Pt was admitted September 2013 for similar atypical chest pain where she had a cardiac 2D echo done w/o abnormalities and an EF 60-65%. Denies any SOB, vomiting, abdominal pain, diaphoresis.    Past Medical History  Diagnosis Date  . Allergy   . Hypertension   . IBS (irritable bowel syndrome)   . Nephropathy   . Hyperlipemia   . Anxiety   . GERD (gastroesophageal reflux disease)   . Chronic constipation   . Kidney disease     Past Surgical History  Procedure Laterality Date  . Abdominal hysterectomy  1998  . Cholecystectomy  1997  . Esophagogastroduodenoscopy    . Tubal ligation      Family History  Problem Relation Age of Onset  . Hypertension Father   . Coronary artery disease Father     History  Substance Use Topics  . Smoking status: Never Smoker   . Smokeless tobacco: Never Used  . Alcohol Use: No    OB History   Grav Para Term Preterm Abortions TAB SAB Ect Mult Living                  Review of Systems  Constitutional: Negative for fever and chills.  Respiratory: Positive for cough. Negative for shortness of breath.   Cardiovascular: Positive for chest pain. Negative for palpitations and leg swelling.   Gastrointestinal: Positive for nausea. Negative for vomiting, abdominal pain and diarrhea.  Musculoskeletal: Positive for back pain.  All other systems reviewed and are negative.    Allergies  Aspirin; Food; and Tramadol  Home Medications   Current Outpatient Rx  Name  Route  Sig  Dispense  Refill  . amLODipine (NORVASC) 5 MG tablet   Oral   Take 5 mg by mouth daily.         Marland Kitchen desloratadine (CLARINEX) 5 MG tablet   Oral   Take 5 mg by mouth daily.         Marland Kitchen docusate sodium (COLACE) 100 MG capsule   Oral   Take 100 mg by mouth daily.          Marland Kitchen esomeprazole (NEXIUM) 40 MG capsule   Oral   Take 40 mg by mouth daily before breakfast.         . levothyroxine (SYNTHROID, LEVOTHROID) 150 MCG tablet   Oral   Take 150 mcg by mouth daily before breakfast.         . metoprolol succinate (TOPROL-XL) 50 MG 24 hr tablet   Oral   Take 50 mg by mouth daily. Take with or immediately following a meal.         . Multiple Vitamin (MULTIVITAMIN) tablet   Oral   Take 1 tablet by mouth  daily.           . quinapril (ACCUPRIL) 40 MG tablet   Oral   Take 40 mg by mouth daily.         . valsartan (DIOVAN) 160 MG tablet   Oral   Take 160 mg by mouth daily.         Marland Kitchen albuterol (PROVENTIL HFA;VENTOLIN HFA) 108 (90 BASE) MCG/ACT inhaler   Inhalation   Inhale 1-2 puffs into the lungs every 6 (six) hours as needed for wheezing.   1 Inhaler   0   . HYDROcodone-acetaminophen (NORCO/VICODIN) 5-325 MG per tablet   Oral   Take 1 tablet by mouth every 4 (four) hours as needed for pain.   6 tablet   0     BP 130/82  Pulse 83  Temp(Src) 99.2 F (37.3 C) (Oral)  Resp 15  SpO2 97%  Physical Exam  Constitutional: She is oriented to person, place, and time. She appears well-developed and well-nourished. No distress.  HENT:  Head: Normocephalic and atraumatic.  Mouth/Throat: Oropharynx is clear and moist.  Eyes: Conjunctivae and EOM are normal. Pupils are equal,  round, and reactive to light.  Neck: Neck supple.  Cardiovascular: Normal rate, regular rhythm, normal heart sounds and intact distal pulses.   Pulmonary/Chest: Effort normal and breath sounds normal. No respiratory distress.  Abdominal: Soft. Bowel sounds are normal.  Musculoskeletal: She exhibits no edema.  Neurological: She is alert and oriented to person, place, and time.  Skin: Skin is warm and dry. She is not diaphoretic.  Psychiatric: She has a normal mood and affect.    ED Course  Procedures (including critical care time)   Date: 01/24/2013  Rate: 77  Rhythm: normal sinus rhythm  QRS Axis: normal  Intervals: normal  ST/T Wave abnormalities: nonspecific T wave changes  Conduction Disutrbances:none  Narrative Interpretation:   Old EKG Reviewed: unchanged    Labs Reviewed  BASIC METABOLIC PANEL - Abnormal; Notable for the following:    GFR calc non Af Amer 65 (*)    GFR calc Af Amer 75 (*)    All other components within normal limits  CBC  POCT I-STAT TROPONIN I   Dg Chest 2 View  01/24/2013   *RADIOLOGY REPORT*  Clinical Data: Chest pain.  Previous pulmonary embolism.  CHEST - 2 VIEW  Comparison: 12/11/2012  Findings: The heart size is normal.  Both lungs are clear.  No evidence of pleural effusion.  No mass or lymphadenopathy identified.  Surgical clips again seen within the superior mediastinum.  IMPRESSION: Stable exam.  No active disease.   Original Report Authenticated By: Myles Rosenthal, M.D.     1. Chest pain, atypical       MDM  Patient is to be discharged with recommendation to follow up with PCP in regards to today's hospital visit. Chest pain is not likely of cardiac or pulmonary etiology d/t presentation, VSS, no tracheal deviation, no JVD or new murmur, RRR, breath sounds equal bilaterally, EKG without acute abnormalities, negative troponin, and negative CXR. Pt has been advised to return to the ED is CP becomes exertional, associated with diaphoresis or  nausea, radiates to left jaw/arm, worsens or becomes concerning in any way. Pt appears reliable for follow up and is agreeable to discharge. Case has been discussed with and seen by Dr. Bebe Shaggy who agrees with the above plan to discharge. Patient is stable at time of discharge  Jeannetta Ellis, PA-C 01/26/13 1257

## 2013-01-24 NOTE — Telephone Encounter (Deleted)
Patient Information:  Caller Name: Rhonda [name]  Phone: [phone]  Patient: Rhonda [name], Rhonda [name]  Gender: Female  DOB: 04/07/1939  Age: 42 Years  PCP: Eleonore Chiquito [facility])  Office Follow Up:  Does the office need to follow up with this patient?: Yes  Instructions For The Office: NEEDS WORK IN APPOINTMENT FOR TODAY 01/24/13- No appointments available with Dr. Amador Cunas   Symptoms  Reason For Call & Symptoms: Rhonda [name] states she has "big purple patches on her arms that are dime to nickel sized" -onset 01/23/13. States the"purple patches seem to follow her veins." Is not aware of any friction or injury. Has been on Plavix  approx 3 years. Has not had this discoloration on skin before. Per rash protocol has see today in office disposition due to localized purple spots on skin that are not from friction or injury. No appointments left in [facility] for Dr. Amador Cunas.  PLEASE CALL MARJORIE FOR WORK IN APPOINTMENT on 01/24/13.  Reviewed Health History In EMR: Yes  Reviewed Medications In EMR: Yes  Reviewed Allergies In EMR: Yes  Reviewed Surgeries / Procedures: Yes  Date of Onset of Symptoms: 01/23/2013  Guideline(s) Used:  Rash or Redness - Localized  Disposition Per Guideline:   See Today in Office  Reason For Disposition Reached:   Localized purple or blood-colored spots or dots that are not from injury or friction (no fever)  Advice Given:  Call Back If:  You become worse.  Patient Will Follow Care Advice:  YES

## 2013-01-25 ENCOUNTER — Telehealth: Payer: Self-pay | Admitting: Internal Medicine

## 2013-01-25 NOTE — Telephone Encounter (Signed)
x

## 2013-01-25 NOTE — Telephone Encounter (Signed)
[facility] Triage Call Report Triage Record Num: [id number] Operator: Frederico Hamman Patient Name: Rhonda [name] Call Date & Time: 01/24/2013 5:37:03PM Patient Phone: [phone] PCP: Gordy Savers Patient Gender: Female PCP Fax : [phone] Patient DOB: 1971-06-27 Practice Name: [facility] Reason for Call: Rhonda [name] called for triage on 01/24/13 after 1600. Unable to find telephone encounter in [facility] for [employer] Rhonda [name] states she has nausea but no diarrhea or vomiting. Requesting a prescription for Phenergan tabs be called in to [place] at [facility]. Per [facility] nausea and vomiting protocol has see provider within 72 hrs due to loss of appetite for 3 or more days. 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 States she thinks she has pain with certain movement. 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 Denies chest pain for > 5 minutes within last hour. Denies shortness of breath. Has history of pulmonary embolus in 2008. Per chest pain protocol has ED disposition due to history of blood clots in the lungs and similar symptoms now. States she will go to [facility] ED now. Care advice given. Protocol(s) Used: Chest Pain Recommended Outcome per Protocol: See ED Immediately Reason for Outcome: History of blood clots in lungs AND similar symptoms now Care Advice: ~ Another adult should drive. Call EMS 911 if shortness of breath develops OR if swelling occurs in upper chest or neck, since collapsed lung or blood clot in lungs can cause these symptoms. ~ Call EMS 911 if signs and symptoms of shock develop (such as unable to stand due to faintness, dizziness, or lightheadedness; new onset of confusion; slow to respond or difficult to awaken; skin is pale, gray, cool, or moist to touch; severe weakness; loss of consciousness). ~ ~ IMMEDIATE ACTION Write down provider's name. List or place the  following in a bag for transport with the patient: current prescription and/or nonprescription medications; alternative treatments, therapies and medications; and street drugs. ~ ~ Place person in a position of comfort and loosen tight clothing.

## 2013-01-26 ENCOUNTER — Telehealth: Payer: Self-pay | Admitting: Internal Medicine

## 2013-01-26 NOTE — ED Provider Notes (Signed)
Medical screening examination/treatment/procedure(s) were conducted as a shared visit with non-physician practitioner(s) and myself.  I personally evaluated the patient during the encounter  Pt well appearing and in no distress.  My suspicion for acute PE is low (pt has distant h/o PE but has multiple negative CT scans since that time) do not feel further workup necessary at this time, she denied pleuritic type CP and no hypoxia noted on my eval.  Low suspicion for ACS do not feel further troponin testing necessary  Joya Gaskins, MD 01/26/13 1333

## 2013-01-26 NOTE — Telephone Encounter (Signed)
Pt states she would like to change from Nexium to Prilosec (to generic omeprazole). 90 day supply  Pt wants generic for all her meds, No generic for Nexium: its $45.00.  Pharm: [facility] [address]

## 2013-01-27 MED ORDER — OMEPRAZOLE 40 MG PO CPDR: 40.0000 mg | DELAYED_RELEASE_CAPSULE | Freq: Every day | ORAL | Status: DC

## 2013-01-27 NOTE — Telephone Encounter (Signed)
ok 

## 2013-01-27 NOTE — Telephone Encounter (Signed)
Left detailed message Rx was sent to pharmacy as requested. 

## 2013-01-27 NOTE — Telephone Encounter (Signed)
Okay to change pt to Omeprazole?

## 2013-02-03 ENCOUNTER — Emergency Department (HOSPITAL_COMMUNITY): Payer: BC Managed Care – PPO

## 2013-02-03 ENCOUNTER — Encounter (HOSPITAL_COMMUNITY): Payer: Self-pay | Admitting: *Deleted

## 2013-02-03 ENCOUNTER — Emergency Department (HOSPITAL_COMMUNITY)
Admission: EM | Admit: 2013-02-03 | Discharge: 2013-02-03 | Disposition: A | Discharge status: Home / Self Care | Payer: BC Managed Care – PPO | Attending: Emergency Medicine | Admitting: Emergency Medicine

## 2013-02-03 DIAGNOSIS — Z8659 Personal history of other mental and behavioral disorders: Secondary | ICD-10-CM | POA: Insufficient documentation

## 2013-02-03 DIAGNOSIS — I129 Hypertensive chronic kidney disease with stage 1 through stage 4 chronic kidney disease, or unspecified chronic kidney disease: Secondary | ICD-10-CM | POA: Insufficient documentation

## 2013-02-03 DIAGNOSIS — Z79899 Other long term (current) drug therapy: Secondary | ICD-10-CM | POA: Insufficient documentation

## 2013-02-03 DIAGNOSIS — R0789 Other chest pain: Secondary | ICD-10-CM | POA: Insufficient documentation

## 2013-02-03 DIAGNOSIS — E079 Disorder of thyroid, unspecified: Secondary | ICD-10-CM | POA: Insufficient documentation

## 2013-02-03 DIAGNOSIS — E785 Hyperlipidemia, unspecified: Secondary | ICD-10-CM | POA: Insufficient documentation

## 2013-02-03 DIAGNOSIS — K219 Gastro-esophageal reflux disease without esophagitis: Secondary | ICD-10-CM | POA: Insufficient documentation

## 2013-02-03 DIAGNOSIS — N059 Unspecified nephritic syndrome with unspecified morphologic changes: Secondary | ICD-10-CM | POA: Insufficient documentation

## 2013-02-03 DIAGNOSIS — K5900 Constipation, unspecified: Secondary | ICD-10-CM | POA: Insufficient documentation

## 2013-02-03 DIAGNOSIS — N189 Chronic kidney disease, unspecified: Secondary | ICD-10-CM | POA: Insufficient documentation

## 2013-02-03 DIAGNOSIS — K589 Irritable bowel syndrome without diarrhea: Secondary | ICD-10-CM | POA: Insufficient documentation

## 2013-02-03 LAB — POCT I-STAT TROPONIN I: Troponin i, poc: 0 ng/mL (ref 0.00–0.08)

## 2013-02-03 LAB — BASIC METABOLIC PANEL
CO2: 28 mEq/L (ref 19–32)
Chloride: 105 mEq/L (ref 96–112)
GFR calc Af Amer: 75 mL/min — ABNORMAL LOW (ref >90)
Potassium: 3.9 mEq/L (ref 3.5–5.1)
Sodium: 140 mEq/L (ref 135–145)

## 2013-02-03 LAB — CBC
Platelets: 266 10*3/uL (ref 150–400)
RBC: 4.52 MIL/uL (ref 3.87–5.11)
RDW: 13.4 % (ref 11.5–15.5)
WBC: 7.6 10*3/uL (ref 4.0–10.5)

## 2013-02-03 NOTE — ED Notes (Signed)
Pt reports intermittent episodes x 2 days of mid chest tightness and feeling lightheaded. ekg done at triage, no acute distress noted at this time.

## 2013-02-03 NOTE — ED Provider Notes (Signed)
History     CSN: [id number]  Arrival date & time 02/03/13  1356   First MD Initiated Contact with Patient 02/03/13 1549      Chief Complaint  Patient presents with  . Chest Pain    (Consider location/radiation/quality/duration/timing/severity/associated sxs/prior treatment) The history is provided by the patient and medical records. No language interpreter was used.    Rhonda [name] is a 42 y.o. female  with a hx of PE in 2008, hyperlipidemia, HTN presents to the Emergency Department complaining of intermittent mid chest tightness onset 2 days ago.  Pt was evaluated on 01/24/13 for the same symptoms and states that they have eased up since that visit but did not resolve.  Pt describes the pain as a pressure with associated SOB only while at work, nausea and lightheadedness.   Being relaxed makes it better and working as a CNA bending, moving, or lifting makes it worse.  Pt has had an associated dry, nagging cough without sputum production for more than 1 month.  Pt has never smoked.  Pt denies fevers, chills, diaphoresis, headache, neck pain, abdominal pain, vomiting diarrhea, weakness, dizziness, dysuria, hematuria, swelling/pain in her legs.  On record review pt was admitted September 2013 for similar atypical chest pain where she had a cardiac 2D echo done w/o abnormalities and an EF 60-65%. Pt has not been evaluated by her PCP stating that each time she calls for an appointment and states that she has chest pain she is referred here to the emergency department.  Past Medical History  Diagnosis Date  . Allergy   . Hypertension   . IBS (irritable bowel syndrome)   . Nephropathy   . Hyperlipemia   . Anxiety   . GERD (gastroesophageal reflux disease)   . Chronic constipation   . Kidney disease   . Thyroid disease     Past Surgical History  Procedure Laterality Date  . Abdominal hysterectomy  1998  . Cholecystectomy  1997  . Esophagogastroduodenoscopy    . Tubal ligation       Family History  Problem Relation Age of Onset  . Hypertension Father   . Coronary artery disease Father     History  Substance Use Topics  . Smoking status: Never Smoker   . Smokeless tobacco: Never Used  . Alcohol Use: No    OB History   Grav Para Term Preterm Abortions TAB SAB Ect Mult Living                  Review of Systems  Constitutional: Negative for fever, diaphoresis, appetite change, fatigue and unexpected weight change.  HENT: Negative for mouth sores and neck stiffness.   Eyes: Negative for visual disturbance.  Respiratory: Positive for cough, chest tightness and shortness of breath. Negative for wheezing.   Cardiovascular: Positive for chest pain.  Gastrointestinal: Positive for nausea. Negative for vomiting, abdominal pain, diarrhea and constipation.  Endocrine: Negative for polydipsia, polyphagia and polyuria.  Genitourinary: Negative for dysuria, urgency, frequency and hematuria.  Musculoskeletal: Negative for back pain.  Skin: Negative for rash.  Allergic/Immunologic: Negative for immunocompromised state.  Neurological: Negative for syncope, light-headedness and headaches.  Hematological: Does not bruise/bleed easily.  Psychiatric/Behavioral: Negative for sleep disturbance. The patient is not nervous/anxious.     Allergies  Aspirin; Food; and Tramadol  Home Medications   Current Outpatient Rx  Name  Route  Sig  Dispense  Refill  . albuterol (PROVENTIL HFA;VENTOLIN HFA) 108 (90 BASE) MCG/ACT inhaler   Inhalation  Inhale 2 puffs into the lungs every 6 (six) hours as needed for wheezing.         Marland Kitchen amLODipine (NORVASC) 5 MG tablet   Oral   Take 5 mg by mouth daily.         Marland Kitchen desloratadine (CLARINEX) 5 MG tablet   Oral   Take 5 mg by mouth daily.         Marland Kitchen docusate sodium (COLACE) 100 MG capsule   Oral   Take 100 mg by mouth daily.          Marland Kitchen levothyroxine (SYNTHROID, LEVOTHROID) 150 MCG tablet   Oral   Take 150 mcg by mouth  daily before breakfast.         . metoprolol succinate (TOPROL-XL) 50 MG 24 hr tablet   Oral   Take 50 mg by mouth daily. Take with or immediately following a meal.         . Multiple Vitamin (MULTIVITAMIN) tablet   Oral   Take 1 tablet by mouth daily.           Marland Kitchen omeprazole (PRILOSEC) 40 MG capsule   Oral   Take 40 mg by mouth daily.         . quinapril (ACCUPRIL) 40 MG tablet   Oral   Take 40 mg by mouth daily.         . valsartan (DIOVAN) 160 MG tablet   Oral   Take 160 mg by mouth daily.           BP 142/90  Pulse 71  Temp(Src) 98.3 F (36.8 C) (Oral)  Resp 14  SpO2 98%  Physical Exam  Nursing note and vitals reviewed. Constitutional: She is oriented to person, place, and time. She appears well-developed and well-nourished. No distress.  HENT:  Head: Normocephalic and atraumatic.  Mouth/Throat: Oropharynx is clear and moist. No oropharyngeal exudate.  Eyes: Conjunctivae are normal. Pupils are equal, round, and reactive to light. No scleral icterus.  Neck: Normal range of motion. Neck supple.  Cardiovascular: Normal rate, regular rhythm, S1 normal, S2 normal, normal heart sounds and intact distal pulses.   No murmur heard. Pulses:      Radial pulses are 2+ on the right side, and 2+ on the left side.       Dorsalis pedis pulses are 2+ on the right side, and 2+ on the left side.       Posterior tibial pulses are 2+ on the right side, and 2+ on the left side.  Patient is not tachycardic on exam No murmur No peripheral edema Capillary refill less than 3 seconds  Pulmonary/Chest: Effort normal and breath sounds normal. No respiratory distress. She has no decreased breath sounds. She has no wheezes. She has no rhonchi. She has no rales. She exhibits no tenderness.  Abdominal: Soft. Bowel sounds are normal. She exhibits no mass. There is no tenderness. There is no rebound and no guarding.  Musculoskeletal: Normal range of motion. She exhibits no edema.  No  peripheral edema No calf tenderness Negative Homans sign  Lymphadenopathy:    She has no cervical adenopathy.  Neurological: She is alert and oriented to person, place, and time. No cranial nerve deficit. She exhibits normal muscle tone. Coordination normal.  Speech is clear and goal oriented Moves extremities without ataxia  Skin: Skin is warm and dry. No rash noted. She is not diaphoretic. No erythema.  Psychiatric: She has a normal mood and affect. Her behavior is  normal.    ED Course  Procedures (including critical care time)  Labs Reviewed  BASIC METABOLIC PANEL - Abnormal; Notable for the following:    Glucose, Bld 139 (*)    GFR calc non Af Amer 65 (*)    GFR calc Af Amer 75 (*)    All other components within normal limits  CBC  POCT I-STAT TROPONIN I   Dg Chest 2 View  02/03/2013   *RADIOLOGY REPORT*  Clinical Data: Chest pain and short of breath  CHEST - 2 VIEW  Comparison: 01/24/2013  Findings: Mild cardiac enlargement without heart failure.  Lungs are free of infiltrate or effusion.  Negative for mass or adenopathy.  Cervical rib on the left.  Surgical clips overlying the trachea on the lower neck.  IMPRESSION: Cardiac enlargement.  No acute cardiopulmonary abnormality.   Original Report Authenticated By: Janeece Riggers, M.D.   ECG:  Date: 02/03/2013 @ 1400  Rate: 98  Rhythm: sinus arrhythmia  QRS Axis: normal  Intervals: normal  ST/T Wave abnormalities: nonspecific T wave changes  Conduction Disutrbances:none  Narrative Interpretation: new T wave inversion in III, aVF, V3-V6 without ST depression or reciprocal changes; when compared to old ECG on 01/24/13 T wave inversion is new in III, aVF, V5, V6  Old EKG Reviewed: changes noted   Date: 02/03/2013 @ 1625  Rate: 65  Rhythm: sinus arrhythmia  QRS Axis: normal  Intervals: normal  ST/T Wave abnormalities: nonspecific T wave changes  Conduction Disutrbances:none  Narrative Interpretation: repeat ECG shows resolution  of T wave inversion in II, aVF, V5, V6 and is unchanged from previous ECG on 01/24/13 at this time  Old EKG Reviewed: changes noted    1. Chest pain, atypical       MDM  Errika Narvaiz presents with atypical chest pain persistent for several weeks.  Chest pain is not likely of cardiac or pulmonary etiology d/t presentation, VSS, no tracheal deviation, no JVD or new murmur, RRR, breath sounds equal bilaterally, EKG without acute abnormalities, negative troponin, and negative CXR. Patient with history of PE, however today is not tachycardic or hypoxic.  She has no tachypnea or hemoptysis.   She has not PERC negative only 2/2 to her hx.  Pt ambulates without difficulty or SOB.  Pt has been advised continue her PPI and return to the ED if CP becomes exertional, associated with diaphoresis or nausea, radiates to left jaw/arm, worsens or becomes concerning in any way. We'll recommend followup with cardiology. Pt appears reliable for follow up and is agreeable to discharge.   Case has been discussed with Dr. Ignacia Palma who agrees with the above plan to discharge.     Dahlia Client Deysha Cartier, PA-C 02/03/13 1755

## 2013-02-09 ENCOUNTER — Telehealth: Payer: Self-pay | Admitting: Internal Medicine

## 2013-02-09 NOTE — Telephone Encounter (Signed)
Patient Information:  Caller Name: [place]  Phone: [phone]  Patient: Rhonda [name], Rhonda [name]  Gender: Female  DOB: [birth year]  Age: 42 Years  PCP: Eleonore Chiquito [facility])  Pregnant: No  Office Follow Up:  Does the office need to follow up with this patient?: Yes  Instructions For The Office: Rx request please call back.  RN Note:  Requesting Phenergan for nausea with  mild headache and mild diarrhea with 1-2 loose/soft stools daily.  No vomiting.  No abdominal pain. Encouraged hydration and bland diet.  Does not meet criteria for use of standing orders for Phenergan.  Patient to call back if symptoms worsen or signs of dehydration.  Home care advice given for all other symptoms per Nausea or Vomiting guideline.  Informed MD is no longer in the office this afternoon and off 02/10/13;  Another MD will review request for anti-nausea mendication. Offie: please follow up. Pharmacy: [facility] at [facility]  Symptoms  Reason For Call & Symptoms: Nausea, mild headache and diarrhea.  Called to reqeust MD order Phenergan  Reviewed Health History In EMR: Yes  Reviewed Medications In EMR: Yes  Reviewed Allergies In EMR: Yes  Reviewed Surgeries / Procedures: Yes  Date of Onset of Symptoms: 02/08/2013  Treatments Tried: Ginger Ale, Tylenol  Treatments Tried Worked: Yes OB / GYN:  LMP: Unknown  Guideline(s) Used:  No Protocol Available - Sick Adult  Disposition Per Guideline:   Home Care  Reason For Disposition Reached:   Patient's symptoms are safe to treat at home per nursing judgment  Advice Given:  N/A  RN Overrode Recommendation:  Patient Requests Prescription  Request Rx for Phenergan

## 2013-02-09 NOTE — ED Provider Notes (Signed)
Medical screening examination/treatment/procedure(s) were performed by non-physician practitioner and as supervising physician I was immediately available for consultation/collaboration.   Carleene Cooper III, MD 02/09/13 [phone]

## 2013-02-10 MED ORDER — PROMETHAZINE HCL 25 MG PO TABS: 25.0000 mg | ORAL_TABLET | Freq: Four times a day (QID) | ORAL | Status: DC | PRN

## 2013-02-10 NOTE — Telephone Encounter (Signed)
Discussed pt with Dr. Caryl Never, gave verbal order for Phenergan. Rx sent to pharmacy.  Left detailed message for pt Rx for Phenergan sent to pharmacy.

## 2013-04-11 ENCOUNTER — Other Ambulatory Visit: Payer: Self-pay | Admitting: Family Medicine

## 2013-05-09 ENCOUNTER — Encounter (HOSPITAL_COMMUNITY): Payer: Self-pay | Admitting: Emergency Medicine

## 2013-05-09 ENCOUNTER — Other Ambulatory Visit (HOSPITAL_COMMUNITY)
Admission: RE | Admit: 2013-05-09 | Discharge: 2013-05-09 | Disposition: A | Discharge status: Home / Self Care | Payer: BC Managed Care – PPO | Source: Ambulatory Visit | Attending: Family Medicine | Admitting: Family Medicine

## 2013-05-09 ENCOUNTER — Emergency Department (INDEPENDENT_AMBULATORY_CARE_PROVIDER_SITE_OTHER)
Admission: EM | Admit: 2013-05-09 | Discharge: 2013-05-09 | Disposition: A | Discharge status: Home / Self Care | Payer: BC Managed Care – PPO | Source: Home / Self Care | Attending: Family Medicine | Admitting: Family Medicine

## 2013-05-09 DIAGNOSIS — N760 Acute vaginitis: Secondary | ICD-10-CM | POA: Insufficient documentation

## 2013-05-09 DIAGNOSIS — Z113 Encounter for screening for infections with a predominantly sexual mode of transmission: Secondary | ICD-10-CM | POA: Insufficient documentation

## 2013-05-09 LAB — POCT URINALYSIS DIP (DEVICE)
Bilirubin Urine: NEGATIVE
Hgb urine dipstick: NEGATIVE
Ketones, ur: NEGATIVE mg/dL
Leukocytes, UA: NEGATIVE
Specific Gravity, Urine: >=1.03 (ref 1.005–1.030)
pH: 6 (ref 5.0–8.0)

## 2013-05-09 LAB — POCT PREGNANCY, URINE: Preg Test, Ur: NEGATIVE

## 2013-05-09 MED ORDER — METRONIDAZOLE 500 MG PO TABS: 500.0000 mg | ORAL_TABLET | Freq: Two times a day (BID) | ORAL | Status: DC

## 2013-05-09 NOTE — ED Notes (Signed)
Pt c/o poss UTI sxs onset 1.5 week Sxs include: nauseas, abd pain, back pain, urinary freq/urgency Denies: dysuria, hematuria, vag d/c She is alert w/no signs of acute distress.

## 2013-05-09 NOTE — ED Provider Notes (Signed)
Rhonda [name] is a 42 y.o. female who presents to Urgent Care today for urinary frequency urgency and dysuria associated with mild pelvic pressure and back pain present for about one and a half weeks. Her symptoms are consistent with prior episodes of urinary tract infection. She has not tried any medications yet. She denies any nausea vomiting diarrhea fevers or chills. She denies any hematuria or vaginal discharge.   Past Medical History  Diagnosis Date  . Allergy   . Hypertension   . IBS (irritable bowel syndrome)   . Nephropathy   . Hyperlipemia   . Anxiety   . GERD (gastroesophageal reflux disease)   . Chronic constipation   . Kidney disease   . Thyroid disease    past surgical history significant for hysterectomy   History  Substance Use Topics  . Smoking status: Never Smoker   . Smokeless tobacco: Never Used  . Alcohol Use: No   ROS as above Medications reviewed. Current Facility-Administered Medications  Medication Dose Route Frequency Provider Last Rate Last Dose  . 0.9 %  sodium chloride infusion  500 mL Intravenous Continuous Louis Meckel, MD       Current Outpatient Prescriptions  Medication Sig Dispense Refill  . desloratadine (CLARINEX) 5 MG tablet Take 5 mg by mouth daily.      Marland Kitchen levothyroxine (SYNTHROID, LEVOTHROID) 150 MCG tablet Take 150 mcg by mouth daily before breakfast.      . metoprolol succinate (TOPROL-XL) 50 MG 24 hr tablet Take 50 mg by mouth daily. Take with or immediately following a meal.      . omeprazole (PRILOSEC) 40 MG capsule Take 40 mg by mouth daily.      . valsartan (DIOVAN) 160 MG tablet Take 160 mg by mouth daily.      Marland Kitchen albuterol (PROVENTIL HFA;VENTOLIN HFA) 108 (90 BASE) MCG/ACT inhaler Inhale 2 puffs into the lungs every 6 (six) hours as needed for wheezing.      Marland Kitchen amLODipine (NORVASC) 5 MG tablet Take 5 mg by mouth daily.      Marland Kitchen docusate sodium (COLACE) 100 MG capsule Take 100 mg by mouth daily.       . metroNIDAZOLE (FLAGYL)  500 MG tablet Take 1 tablet (500 mg total) by mouth 2 (two) times daily.  14 tablet  0  . Multiple Vitamin (MULTIVITAMIN) tablet Take 1 tablet by mouth daily.        . promethazine (PHENERGAN) 25 MG tablet TAKE 1 TABLET BY MOUTH EVERY 6 HOURS AS NEEDED FOR NAUSEA  20 tablet  0  . quinapril (ACCUPRIL) 40 MG tablet Take 40 mg by mouth daily.        Exam:  BP 122/92  Pulse 75  Temp(Src) 97.7 F (36.5 C) (Oral)  Resp 16  SpO2 100% Gen: Well NAD HEENT: EOMI,  MMM Lungs: CTABL Nl WOB Heart: RRR no MRG Abd: NABS, NT, ND Exts: Non edematous BL  LE, warm and well perfused.  GYN: Normal external genitalia vaginal canal white discharge. Normal-appearing vaginal cuff. Bimanual exam no significant adnexal masses nontender  Results for orders placed during the hospital encounter of 05/09/13 (from the past 24 hour(s))  POCT URINALYSIS DIP (DEVICE)     Status: Abnormal   Collection Time    05/09/13 11:58 AM      Result Value Range   Glucose, UA NEGATIVE  NEGATIVE mg/dL   Bilirubin Urine NEGATIVE  NEGATIVE   Ketones, ur NEGATIVE  NEGATIVE mg/dL   Specific Gravity,  Urine >=1.030  1.005 - 1.030   Hgb urine dipstick NEGATIVE  NEGATIVE   pH 6.0  5.0 - 8.0   Protein, ur >=300 (*) NEGATIVE mg/dL   Urobilinogen, UA 0.2  0.0 - 1.0 mg/dL   Nitrite NEGATIVE  NEGATIVE   Leukocytes, UA NEGATIVE  NEGATIVE  POCT PREGNANCY, URINE     Status: None   Collection Time    05/09/13 12:12 PM      Result Value Range   Preg Test, Ur NEGATIVE  NEGATIVE   No results found.  Assessment and Plan: 42 y.o. female with vaginitis.  Cytology pending for gonorrhea Chlamydia Trichomonas BV and yeast. Urine culture pending Plan to treat empirically with metronidazole Followup as needed Discussed warning signs or symptoms. Please see discharge instructions. Patient expresses understanding.     Rodolph Bong, MD 05/09/13 1236

## 2013-05-10 ENCOUNTER — Telehealth: Payer: Self-pay | Admitting: Internal Medicine

## 2013-05-10 LAB — URINE CULTURE: Culture: NO GROWTH

## 2013-05-10 MED ORDER — HYDROCODONE-HOMATROPINE 5-1.5 MG/5ML PO SYRP: 5.0000 mL | ORAL_SOLUTION | Freq: Four times a day (QID) | ORAL | Status: DC | PRN

## 2013-05-10 NOTE — Telephone Encounter (Signed)
Pharm called for pt to refill HYDROcodone-homatropine (HYCODAN)  [facility]/ [address]

## 2013-05-10 NOTE — Telephone Encounter (Signed)
Spoke to pt told her pharmacy called for refill on Hycodan cough syrup, asked pt what symptoms is she having? Pt stated she has allergies and has been coughing at night a lot and she ran out. Told pt okay will call Rx into pharmacy for her but if symptoms persist need to make an appt. Pt verbalized understanding.

## 2013-05-13 NOTE — ED Notes (Signed)
STD screenings positive for Gardnerella, negative for yeast , GC, or chlamydia; no further action required

## 2013-05-13 NOTE — ED Notes (Signed)
Final report on UA culture no growth

## 2013-06-01 ENCOUNTER — Ambulatory Visit: Payer: BC Managed Care – PPO | Admitting: Internal Medicine

## 2013-06-06 ENCOUNTER — Encounter: Payer: Self-pay | Admitting: Internal Medicine

## 2013-06-06 ENCOUNTER — Ambulatory Visit (INDEPENDENT_AMBULATORY_CARE_PROVIDER_SITE_OTHER): Payer: BC Managed Care – PPO | Admitting: Internal Medicine

## 2013-06-06 VITALS — BP 150/90 | HR 71 | Temp 98.2°F | Resp 20 | Ht 65.5 in | Wt 212.0 lb

## 2013-06-06 DIAGNOSIS — E039 Hypothyroidism, unspecified: Secondary | ICD-10-CM

## 2013-06-06 DIAGNOSIS — I10 Essential (primary) hypertension: Secondary | ICD-10-CM

## 2013-06-06 DIAGNOSIS — K219 Gastro-esophageal reflux disease without esophagitis: Secondary | ICD-10-CM

## 2013-06-06 DIAGNOSIS — N289 Disorder of kidney and ureter, unspecified: Secondary | ICD-10-CM

## 2013-06-06 DIAGNOSIS — E785 Hyperlipidemia, unspecified: Secondary | ICD-10-CM

## 2013-06-06 DIAGNOSIS — Z0000 Encounter for general adult medical examination without abnormal findings: Secondary | ICD-10-CM

## 2013-06-06 LAB — LIPID PANEL: HDL: 36.9 mg/dL — ABNORMAL LOW (ref >39.00)

## 2013-06-06 LAB — LDL CHOLESTEROL, DIRECT: Direct LDL: 143.9 mg/dL

## 2013-06-06 NOTE — Patient Instructions (Signed)
Limit your sodium (Salt) intake    It is important that you exercise regularly, at least 20 minutes 3 to 4 times per week.  If you develop chest pain or shortness of breath seek  medical attention.  Please check your blood pressure on a regular basis.  If it is consistently greater than 150/90, please make an office appointment.  You need to lose weight.  Consider a lower calorie diet and regular exercise. He.73m

## 2013-06-06 NOTE — Progress Notes (Signed)
Subjective:    Patient ID: Rhonda [name], female    DOB: 1971-03-30, 42 y.o.   MRN: [id number]  HPI  42 year old patient who is seen today for a wellness exam. Medical problems include treated hypertension. She is followed by nephrology due to stable C1q nephropathy. She has a history of dyslipidemia gastroesophageal reflux disease. She has had a remote hysterectomy in 1998 for benign disease. She has had upper and lower endoscopies. Past history is also pertinent for a remote history of pulmonary emboli. She has hypothyroidism. She is a frequent user of  urgent care and ED facilities  Family history father is 72 has cardiac and cerebrovascular disease Mother also is 44 with hypertension One brother with probable hypertension One sister died at age 31 apparently complications of myasthenia gravis  Social history. Single rare tobacco use   Past Medical History  Diagnosis Date  . Allergy   . Hypertension   . IBS (irritable bowel syndrome)   . Nephropathy   . Hyperlipemia   . Anxiety   . GERD (gastroesophageal reflux disease)   . Chronic constipation   . Kidney disease   . Thyroid disease     History   Social History  . Marital Status: Divorced    Spouse Name: N/A    Number of Children: 2  . Years of Education: N/A   Occupational History  . NURSING ASSISTANT    Social History Main Topics  . Smoking status: Never Smoker   . Smokeless tobacco: Never Used  . Alcohol Use: No  . Drug Use: No  . Sexual Activity: Not on file   Other Topics Concern  . Not on file   Social History Narrative  . No narrative on file    Past Surgical History  Procedure Laterality Date  . Abdominal hysterectomy  1998  . Cholecystectomy  1997  . Esophagogastroduodenoscopy    . Tubal ligation      Family History  Problem Relation Age of Onset  . Hypertension Father   . Coronary artery disease Father     Allergies  Allergen Reactions  . Aspirin Hives  . Food Other (See Comments)     Lima beans  . Tramadol Other (See Comments)    Headache     Current Outpatient Prescriptions on File Prior to Visit  Medication Sig Dispense Refill  . albuterol (PROVENTIL HFA;VENTOLIN HFA) 108 (90 BASE) MCG/ACT inhaler Inhale 2 puffs into the lungs every 6 (six) hours as needed for wheezing.      Marland Kitchen amLODipine (NORVASC) 5 MG tablet Take 5 mg by mouth daily.      Marland Kitchen desloratadine (CLARINEX) 5 MG tablet Take 5 mg by mouth daily.      Marland Kitchen docusate sodium (COLACE) 100 MG capsule Take 100 mg by mouth daily.       Marland Kitchen levothyroxine (SYNTHROID, LEVOTHROID) 150 MCG tablet Take 150 mcg by mouth daily before breakfast.      . metoprolol succinate (TOPROL-XL) 50 MG 24 hr tablet Take 50 mg by mouth daily. Take with or immediately following a meal.      . Multiple Vitamin (MULTIVITAMIN) tablet Take 1 tablet by mouth daily.        Marland Kitchen omeprazole (PRILOSEC) 40 MG capsule Take 40 mg by mouth daily.      . promethazine (PHENERGAN) 25 MG tablet TAKE 1 TABLET BY MOUTH EVERY 6 HOURS AS NEEDED FOR NAUSEA  20 tablet  0  . quinapril (ACCUPRIL) 40 MG tablet Take 40 mg  by mouth daily.      . valsartan (DIOVAN) 160 MG tablet Take 160 mg by mouth daily.      Marland Kitchen HYDROcodone-homatropine (HYCODAN) 5-1.5 MG/5ML syrup Take 5 mLs by mouth every 6 (six) hours as needed for cough.  120 mL  0   Current Facility-Administered Medications on File Prior to Visit  Medication Dose Route Frequency Provider Last Rate Last Dose  . 0.9 %  sodium chloride infusion  500 mL Intravenous Continuous Louis Meckel, MD        BP 150/90  Pulse 71  Temp(Src) 98.2 F (36.8 C) (Oral)  Resp 20  Ht 5' 5.5" (1.664 m)  Wt 212 lb (96.163 kg)  BMI 34.73 kg/m2  SpO2 98%     Review of Systems  Constitutional: Negative for fever, appetite change, fatigue and unexpected weight change.  HENT: Negative for congestion, dental problem, ear pain, hearing loss, mouth sores, nosebleeds, sinus pressure, sore throat, tinnitus, trouble swallowing and  voice change.   Eyes: Negative for photophobia, pain, redness and visual disturbance.  Respiratory: Negative for cough, chest tightness and shortness of breath.   Cardiovascular: Negative for chest pain, palpitations and leg swelling.  Gastrointestinal: Negative for nausea, vomiting, abdominal pain, diarrhea, constipation, blood in stool, abdominal distention and rectal pain.  Genitourinary: Negative for dysuria, urgency, frequency, hematuria, flank pain, vaginal bleeding, vaginal discharge, difficulty urinating, genital sores, vaginal pain, menstrual problem and pelvic pain.  Musculoskeletal: Negative for arthralgias, back pain and neck stiffness.  Skin: Negative for rash.  Neurological: Negative for dizziness, syncope, speech difficulty, weakness, light-headedness, numbness and headaches.  Hematological: Negative for adenopathy. Does not bruise/bleed easily.  Psychiatric/Behavioral: Negative for suicidal ideas, behavioral problems, self-injury, dysphoric mood and agitation. The patient is not nervous/anxious.        Objective:   Physical Exam  Constitutional: She is oriented to person, place, and time. She appears well-developed and well-nourished.  HENT:  Head: Normocephalic and atraumatic.  Right Ear: External ear normal.  Left Ear: External ear normal.  Mouth/Throat: Oropharynx is clear and moist.  Eyes: Conjunctivae and EOM are normal.  Neck: Normal range of motion. Neck supple. No JVD present. No thyromegaly present.  Cardiovascular: Normal rate, regular rhythm, normal heart sounds and intact distal pulses.   No murmur heard. Pulmonary/Chest: Effort normal and breath sounds normal. She has no wheezes. She has no rales.  Abdominal: Soft. Bowel sounds are normal. She exhibits no distension and no mass. There is no tenderness. There is no rebound and no guarding.  Musculoskeletal: Normal range of motion. She exhibits no edema and no tenderness.  Neurological: She is alert and  oriented to person, place, and time. She has normal reflexes. No cranial nerve deficit. She exhibits normal muscle tone. Coordination normal.  Skin: Skin is warm and dry. No rash noted.  Scattered tattoos  Psychiatric: She has a normal mood and affect. Her behavior is normal.          Assessment & Plan:   Preventive health exam Hypertension. Blood pressure high normal today but she has not taken her medications. Compliance with her medications discussed C1q nephropathy. Followup renal medicine History dyslipidemia. A lipid profile will be reviewed GERD. Continue PPI therapy  Recheck 6 months

## 2013-06-13 ENCOUNTER — Other Ambulatory Visit: Payer: Self-pay | Admitting: Internal Medicine

## 2013-06-20 ENCOUNTER — Encounter (HOSPITAL_COMMUNITY): Payer: Self-pay | Admitting: Emergency Medicine

## 2013-06-20 ENCOUNTER — Emergency Department (HOSPITAL_COMMUNITY)
Admission: EM | Admit: 2013-06-20 | Discharge: 2013-06-20 | Disposition: A | Discharge status: Home / Self Care | Payer: BC Managed Care – PPO | Attending: Emergency Medicine | Admitting: Emergency Medicine

## 2013-06-20 DIAGNOSIS — M545 Low back pain, unspecified: Secondary | ICD-10-CM | POA: Insufficient documentation

## 2013-06-20 DIAGNOSIS — I10 Essential (primary) hypertension: Secondary | ICD-10-CM | POA: Insufficient documentation

## 2013-06-20 DIAGNOSIS — A499 Bacterial infection, unspecified: Secondary | ICD-10-CM | POA: Insufficient documentation

## 2013-06-20 DIAGNOSIS — Z90710 Acquired absence of both cervix and uterus: Secondary | ICD-10-CM | POA: Insufficient documentation

## 2013-06-20 DIAGNOSIS — R110 Nausea: Secondary | ICD-10-CM | POA: Insufficient documentation

## 2013-06-20 DIAGNOSIS — N949 Unspecified condition associated with female genital organs and menstrual cycle: Secondary | ICD-10-CM | POA: Insufficient documentation

## 2013-06-20 DIAGNOSIS — K5900 Constipation, unspecified: Secondary | ICD-10-CM | POA: Insufficient documentation

## 2013-06-20 DIAGNOSIS — Z9089 Acquired absence of other organs: Secondary | ICD-10-CM | POA: Insufficient documentation

## 2013-06-20 DIAGNOSIS — N760 Acute vaginitis: Secondary | ICD-10-CM | POA: Insufficient documentation

## 2013-06-20 DIAGNOSIS — K219 Gastro-esophageal reflux disease without esophagitis: Secondary | ICD-10-CM | POA: Insufficient documentation

## 2013-06-20 DIAGNOSIS — E079 Disorder of thyroid, unspecified: Secondary | ICD-10-CM | POA: Insufficient documentation

## 2013-06-20 DIAGNOSIS — Z87448 Personal history of other diseases of urinary system: Secondary | ICD-10-CM | POA: Insufficient documentation

## 2013-06-20 DIAGNOSIS — B9689 Other specified bacterial agents as the cause of diseases classified elsewhere: Secondary | ICD-10-CM

## 2013-06-20 DIAGNOSIS — Z8659 Personal history of other mental and behavioral disorders: Secondary | ICD-10-CM | POA: Insufficient documentation

## 2013-06-20 DIAGNOSIS — Z87441 Personal history of nephrotic syndrome: Secondary | ICD-10-CM | POA: Insufficient documentation

## 2013-06-20 DIAGNOSIS — Z79899 Other long term (current) drug therapy: Secondary | ICD-10-CM | POA: Insufficient documentation

## 2013-06-20 DIAGNOSIS — R102 Pelvic and perineal pain: Secondary | ICD-10-CM

## 2013-06-20 LAB — URINALYSIS, ROUTINE W REFLEX MICROSCOPIC
Bilirubin Urine: NEGATIVE
Glucose, UA: NEGATIVE mg/dL
Hgb urine dipstick: NEGATIVE
Ketones, ur: NEGATIVE mg/dL
Leukocytes, UA: NEGATIVE
Nitrite: NEGATIVE
Protein, ur: 100 mg/dL — AB
Specific Gravity, Urine: 1.017 (ref 1.005–1.030)
Urobilinogen, UA: 0.2 mg/dL (ref 0.0–1.0)
pH: 6 (ref 5.0–8.0)

## 2013-06-20 LAB — URINE MICROSCOPIC-ADD ON

## 2013-06-20 LAB — WET PREP, GENITAL
Trich, Wet Prep: NONE SEEN
Yeast Wet Prep HPF POC: NONE SEEN

## 2013-06-20 LAB — GC/CHLAMYDIA PROBE AMP
CT Probe RNA: NEGATIVE
GC Probe RNA: NEGATIVE

## 2013-06-20 MED ORDER — CLINDAMYCIN PHOSPHATE 100 MG VA SUPP: 100.0000 mg | Freq: Every day | VAGINAL | Status: DC

## 2013-06-20 NOTE — ED Notes (Signed)
Pt states that she woke up Sunday with lower back pain that radiates to lower abd / pelvic pain; pt denies urinary symptoms; pt states that she is having the sensation of being stuck with pins to her vaginal area; pt c/o sharp pains to lower back that has progressively gotten worse since Sunday and lower abd cramping; denies vomiting or diarrhea ; c/o nausea; pt c/o clear vaginal discharge

## 2013-06-20 NOTE — ED Provider Notes (Signed)
CSN: [id number]     Arrival date & time 06/20/13  0540 History   First MD Initiated Contact with Patient 06/20/13 [phone]     Chief Complaint  Patient presents with  . Back Pain  . Abdominal Pain   (Consider location/radiation/quality/duration/timing/severity/associated sxs/prior Treatment) HPI Pt is a 42yo female with hx of hysterectomy, tubal ligation, cholecystectomy, IBS, kidney disease, and chronic constipation c/o lower back pain that started Sunday, 11/2, that has begun to radiate into lower abdomen. Pain is intermittent, pressure "like having a baby," and sharp pains in her back, lower abdominal cramping, states it feels like she is being stuck with pins in her vagina.  Pain is progressively worsening, 6/10 at worst. Reports clear vaginal discharge. Pain associated with nausea but denies vomiting. Denies fever or diarrhea.  Denies urinary symptoms.  Past Medical History  Diagnosis Date  . Allergy   . Hypertension   . IBS (irritable bowel syndrome)   . Nephropathy   . Hyperlipemia   . Anxiety   . GERD (gastroesophageal reflux disease)   . Chronic constipation   . Kidney disease   . Thyroid disease    Past Surgical History  Procedure Laterality Date  . Abdominal hysterectomy  1998  . Cholecystectomy  1997  . Esophagogastroduodenoscopy    . Tubal ligation     Family History  Problem Relation Age of Onset  . Hypertension Father   . Coronary artery disease Father    History  Substance Use Topics  . Smoking status: Never Smoker   . Smokeless tobacco: Never Used  . Alcohol Use: No   OB History   Grav Para Term Preterm Abortions TAB SAB Ect Mult Living                 Review of Systems  Constitutional: Negative for fever and chills.  Gastrointestinal: Positive for nausea and abdominal pain. Negative for vomiting, diarrhea and constipation.  Genitourinary: Positive for vaginal discharge, vaginal pain and pelvic pain. Negative for dysuria, urgency, frequency, hematuria,  flank pain, decreased urine volume, vaginal bleeding and genital sores.  Musculoskeletal: Positive for back pain ( lower back).  Skin: Negative for rash.  All other systems reviewed and are negative.    Allergies  Aspirin; Flagyl; Food; and Tramadol  Home Medications   Current Outpatient Rx  Name  Route  Sig  Dispense  Refill  . albuterol (PROVENTIL HFA;VENTOLIN HFA) 108 (90 BASE) MCG/ACT inhaler   Inhalation   Inhale 2 puffs into the lungs every 6 (six) hours as needed for wheezing.         Marland Kitchen amLODipine (NORVASC) 5 MG tablet   Oral   Take 5 mg by mouth daily.         Marland Kitchen desloratadine (CLARINEX) 5 MG tablet   Oral   Take 5 mg by mouth daily.         Marland Kitchen docusate sodium (COLACE) 100 MG capsule   Oral   Take 100 mg by mouth daily.          Marland Kitchen levothyroxine (SYNTHROID, LEVOTHROID) 150 MCG tablet   Oral   Take 150 mcg by mouth daily before breakfast.         . metoprolol succinate (TOPROL-XL) 50 MG 24 hr tablet   Oral   Take 50 mg by mouth daily. Take with or immediately following a meal.         . Multiple Vitamin (MULTIVITAMIN) tablet   Oral   Take 1 tablet by  mouth daily.           Marland Kitchen omeprazole (PRILOSEC) 40 MG capsule   Oral   Take 40 mg by mouth daily.         . promethazine (PHENERGAN) 25 MG tablet      TAKE 1 TABLET BY MOUTH EVERY 6 HOURS AS NEEDED FOR NAUSEA   20 tablet   0   . quinapril (ACCUPRIL) 40 MG tablet   Oral   Take 40 mg by mouth daily.         . valsartan (DIOVAN) 160 MG tablet   Oral   Take 160 mg by mouth daily.         . clindamycin (CLEOCIN) 100 MG vaginal suppository   Vaginal   Place 1 suppository (100 mg total) vaginally at bedtime.   3 suppository   0    BP 137/81  Pulse 71  Temp(Src) 98.5 F (36.9 C) (Oral)  Resp 18  SpO2 100% Physical Exam  Nursing note and vitals reviewed. Constitutional: She appears well-developed and well-nourished. No distress.  Pt lying comfortably in exam bed, NAD.   HENT:   Head: Normocephalic and atraumatic.  Eyes: Conjunctivae are normal. No scleral icterus.  Neck: Normal range of motion.  Cardiovascular: Normal rate, regular rhythm and normal heart sounds.   Pulmonary/Chest: Effort normal and breath sounds normal. No respiratory distress. She has no wheezes. She has no rales. She exhibits no tenderness.  Abdominal: Soft. Bowel sounds are normal. She exhibits no distension and no mass. There is tenderness ( lower). There is no rebound and no guarding. Hernia confirmed negative in the right inguinal area and confirmed negative in the left inguinal area.  Obese abdomen, soft, no-distended, mild lower abdominal/peliv tenderness.  Genitourinary: No labial fusion. There is no rash, tenderness, lesion or injury on the right labia. There is no rash, tenderness, lesion or injury on the left labia. Cervix exhibits no motion tenderness, no discharge and no friability. Right adnexum displays tenderness. Right adnexum displays no mass and no fullness. Left adnexum displays no mass, no tenderness and no fullness. No erythema, tenderness or bleeding around the vagina. No foreign body around the vagina. No signs of injury around the vagina. Vaginal discharge found.  External exam: no lesions or rashes, Speculum: white malodorous discharge in vaginal canal. Surgical vaginal pouch in tact, no CMT, right adnexal tenderness but no masses. No left adnexal tenderness or mass.  Musculoskeletal: Normal range of motion.  Lymphadenopathy:       Right: No inguinal adenopathy present.       Left: No inguinal adenopathy present.  Neurological: She is alert.  Skin: Skin is warm and dry. She is not diaphoretic.    ED Course  Procedures (including critical care time) Labs Review Labs Reviewed  WET PREP, GENITAL - Abnormal; Notable for the following:    Clue Cells Wet Prep HPF POC MODERATE (*)    WBC, Wet Prep HPF POC FEW (*)    All other components within normal limits  URINALYSIS,  ROUTINE W REFLEX MICROSCOPIC - Abnormal; Notable for the following:    APPearance CLOUDY (*)    Protein, ur 100 (*)    All other components within normal limits  GC/CHLAMYDIA PROBE AMP  URINE MICROSCOPIC-ADD ON   Imaging Review No results found.  EKG Interpretation   None       MDM   1. BV (bacterial vaginosis)   2. Pelvic pain    Pt c/o low back  pain radiating into lower abdomen. Vitals: unremarkable.  On exam abd: soft, non-distended, mild pelvic tenderness.  Not concerned for surgical abdomen. Pt declined pain and nausea medication.  UA and pelvic exam performed. No urine pregnancy performed at pt had tubal ligation and hysterectomy.   UA: unremarkable Wet prep: moderate clue cells. Pt states she cannot tolerate flagyl as it causes severe GI symptoms. Will Rx: clindamycin vaginal suppository.   Not concerned for emergent process taking place at this time. Do not believe further workup is needed at this time.  Will discharge pt home. Return precautions provided. Will have her f/u with GYN at [facility] if symptoms do not improve in 2-3 days.  Sooner if worsening. Pt verbalized understanding and agreement with tx plan.      Junius Finner, PA-C 06/20/13 [phone]

## 2013-06-20 NOTE — ED Provider Notes (Signed)
Medical screening examination/treatment/procedure(s) were performed by non-physician practitioner and as supervising physician I was immediately available for consultation/collaboration.  EKG Interpretation   None         Gwyneth Sprout, MD 06/20/13 1529

## 2013-06-21 ENCOUNTER — Telehealth (HOSPITAL_BASED_OUTPATIENT_CLINIC_OR_DEPARTMENT_OTHER): Payer: Self-pay | Admitting: *Deleted

## 2013-06-21 NOTE — Telephone Encounter (Signed)
Pt called to request change in medication from vaginal suppository to capsule. Presciption changed to clindamycin 300mg  bid x 7 days for total of 14 capsules. Junius Finner, PA.  Called to [facility] on [address].

## 2013-07-14 ENCOUNTER — Other Ambulatory Visit: Payer: Self-pay | Admitting: Internal Medicine

## 2013-08-03 ENCOUNTER — Other Ambulatory Visit: Payer: Self-pay | Admitting: Internal Medicine

## 2013-08-14 ENCOUNTER — Encounter (HOSPITAL_COMMUNITY): Payer: Self-pay | Admitting: Emergency Medicine

## 2013-08-14 ENCOUNTER — Emergency Department (HOSPITAL_COMMUNITY)
Admission: EM | Admit: 2013-08-14 | Discharge: 2013-08-14 | Disposition: A | Discharge status: Home / Self Care | Payer: BC Managed Care – PPO | Attending: Emergency Medicine | Admitting: Emergency Medicine

## 2013-08-14 ENCOUNTER — Telehealth: Payer: Self-pay | Admitting: Internal Medicine

## 2013-08-14 ENCOUNTER — Emergency Department (HOSPITAL_COMMUNITY): Payer: BC Managed Care – PPO

## 2013-08-14 DIAGNOSIS — J3489 Other specified disorders of nose and nasal sinuses: Secondary | ICD-10-CM | POA: Insufficient documentation

## 2013-08-14 DIAGNOSIS — J40 Bronchitis, not specified as acute or chronic: Secondary | ICD-10-CM

## 2013-08-14 DIAGNOSIS — H9209 Otalgia, unspecified ear: Secondary | ICD-10-CM | POA: Insufficient documentation

## 2013-08-14 DIAGNOSIS — Z87448 Personal history of other diseases of urinary system: Secondary | ICD-10-CM | POA: Insufficient documentation

## 2013-08-14 DIAGNOSIS — Z8659 Personal history of other mental and behavioral disorders: Secondary | ICD-10-CM | POA: Insufficient documentation

## 2013-08-14 DIAGNOSIS — J209 Acute bronchitis, unspecified: Secondary | ICD-10-CM | POA: Insufficient documentation

## 2013-08-14 DIAGNOSIS — E079 Disorder of thyroid, unspecified: Secondary | ICD-10-CM | POA: Insufficient documentation

## 2013-08-14 DIAGNOSIS — I10 Essential (primary) hypertension: Secondary | ICD-10-CM | POA: Insufficient documentation

## 2013-08-14 DIAGNOSIS — K5900 Constipation, unspecified: Secondary | ICD-10-CM | POA: Insufficient documentation

## 2013-08-14 DIAGNOSIS — K219 Gastro-esophageal reflux disease without esophagitis: Secondary | ICD-10-CM | POA: Insufficient documentation

## 2013-08-14 DIAGNOSIS — Z79899 Other long term (current) drug therapy: Secondary | ICD-10-CM | POA: Insufficient documentation

## 2013-08-14 LAB — RAPID STREP SCREEN (MED CTR MEBANE ONLY): Streptococcus, Group A Screen (Direct): NEGATIVE

## 2013-08-14 MED ORDER — ALBUTEROL SULFATE HFA 108 (90 BASE) MCG/ACT IN AERS: 2.0000 | INHALATION_SPRAY | RESPIRATORY_TRACT | Status: DC | PRN

## 2013-08-14 MED ORDER — HYDROCOD POLST-CHLORPHEN POLST 10-8 MG/5ML PO LQCR: 5.0000 mL | Freq: Two times a day (BID) | ORAL | Status: DC | PRN

## 2013-08-14 MED ORDER — AZITHROMYCIN 250 MG PO TABS: 250.0000 mg | ORAL_TABLET | Freq: Every day | ORAL | Status: DC

## 2013-08-14 NOTE — ED Provider Notes (Signed)
CSN: [id number]     Arrival date & time 08/14/13  1102 History  This chart was scribed for non-physician practitioner, Izola Price. Marisue Humble, PA-C working with Dagmar Hait, MD by Greggory Stallion, ED scribe. This patient was seen in room WTR7/WTR7 and the patient's care was started at 12:35 PM.   Chief Complaint  Patient presents with  . Cough  . Wheezing  . Nasal Congestion   The history is provided by the patient. No language interpreter was used.   HPI Comments: Rhonda [name] is a 42 y.o. female who presents to the Emergency Department complaining of congestion, yellow rhinorrhea, productive cough of yellow sputum, wheezing, sinus pressure and ear pain 12 days ago. Pt has chest pain and back pain when she coughs. She has taken coricidin with no relief. Denies fever, sore throat. Pt got a flu shot this year.    Past Medical History  Diagnosis Date  . Allergy   . Hypertension   . IBS (irritable bowel syndrome)   . Nephropathy   . Hyperlipemia   . Anxiety   . GERD (gastroesophageal reflux disease)   . Chronic constipation   . Kidney disease   . Thyroid disease    Past Surgical History  Procedure Laterality Date  . Abdominal hysterectomy  1998  . Cholecystectomy  1997  . Esophagogastroduodenoscopy    . Tubal ligation     Family History  Problem Relation Age of Onset  . Hypertension Father   . Coronary artery disease Father    History  Substance Use Topics  . Smoking status: Never Smoker   . Smokeless tobacco: Never Used  . Alcohol Use: No   OB History   Grav Para Term Preterm Abortions TAB SAB Ect Mult Living                 Review of Systems  Constitutional: Negative for fever.  HENT: Positive for congestion, ear pain, rhinorrhea and sinus pressure. Negative for sore throat.   Respiratory: Positive for cough and wheezing.   All other systems reviewed and are negative.    Allergies  Aspirin; Flagyl; Food; and Tramadol  Home Medications   Current  Outpatient Rx  Name  Route  Sig  Dispense  Refill  . albuterol (PROVENTIL HFA;VENTOLIN HFA) 108 (90 BASE) MCG/ACT inhaler   Inhalation   Inhale 2 puffs into the lungs every 6 (six) hours as needed for wheezing.         Marland Kitchen amLODipine (NORVASC) 5 MG tablet      TAKE ONE TABLET BY MOUTH ONCE DAILY   30 tablet   5   . desloratadine (CLARINEX) 5 MG tablet   Oral   Take 5 mg by mouth daily.         Marland Kitchen docusate sodium (COLACE) 100 MG capsule   Oral   Take 100 mg by mouth daily.          Marland Kitchen levothyroxine (SYNTHROID, LEVOTHROID) 150 MCG tablet   Oral   Take 150 mcg by mouth daily before breakfast.         . metoprolol succinate (TOPROL-XL) 50 MG 24 hr tablet      TAKE ONE TABLET BY MOUTH ONCE DAILY. TAKE IMMEDIATELY FOLLOWING A MEAL   30 tablet   5   . Multiple Vitamin (MULTIVITAMIN) tablet   Oral   Take 1 tablet by mouth daily.           Marland Kitchen omeprazole (PRILOSEC) 40 MG capsule  TAKE ONE CAPSULE BY MOUTH DAILY   30 capsule   5   . promethazine (PHENERGAN) 25 MG tablet      TAKE 1 TABLET BY MOUTH EVERY 6 HOURS AS NEEDED FOR NAUSEA   20 tablet   0   . quinapril (ACCUPRIL) 40 MG tablet   Oral   Take 40 mg by mouth daily.         . valsartan (DIOVAN) 160 MG tablet   Oral   Take 160 mg by mouth daily.          BP 124/94  Pulse 85  Temp(Src) 98.8 F (37.1 C) (Oral)  Resp 18  Wt 212 lb (96.163 kg)  SpO2 98%  Physical Exam  Nursing note and vitals reviewed. Constitutional: She is oriented to person, place, and time. She appears well-developed and well-nourished. No distress.  HENT:  Head: Normocephalic and atraumatic.  Right Ear: External ear normal.  Left Ear: External ear normal.  Mouth/Throat: Oropharynx is clear and moist. No oropharyngeal exudate.  Boggy nasal mucosa with clear rhinorrhea - tenderness to palpation of maxillary sinuses  Eyes: Conjunctivae are normal. Pupils are equal, round, and reactive to light. Right eye exhibits no  discharge. Left eye exhibits no discharge. No scleral icterus.  Neck: Normal range of motion. Neck supple.  Cardiovascular: Normal rate, regular rhythm and normal heart sounds.  Exam reveals no gallop and no friction rub.   No murmur heard. Pulmonary/Chest: Effort normal and breath sounds normal. No respiratory distress. She has no wheezes. She has no rales. She exhibits tenderness.  Mild parasternal chest tenderness to palpation  Abdominal: Soft. Bowel sounds are normal. She exhibits no distension. There is no tenderness.  Musculoskeletal: Normal range of motion. She exhibits no edema and no tenderness.  Lymphadenopathy:    She has no cervical adenopathy.  Neurological: She is alert and oriented to person, place, and time. She exhibits normal muscle tone. Coordination normal.  Skin: Skin is warm and dry. No rash noted. No erythema. No pallor.  Psychiatric: She has a normal mood and affect. Her behavior is normal. Judgment and thought content normal.    ED Course  Procedures (including critical care time)  DIAGNOSTIC STUDIES: Oxygen Saturation is 98% on RA, normal by my interpretation.    COORDINATION OF CARE: 12:39 PM-Discussed treatment plan which includes inhaler and cough medicine with pt at bedside and pt agreed to plan.   Labs Review Labs Reviewed - No data to display Imaging Review Dg Chest 2 View  08/14/2013   CLINICAL DATA:  42 year old female with cough and congestion. Wheezing. Initial encounter.  EXAM: CHEST  2 VIEW  COMPARISON:  01/2013 and earlier.  FINDINGS: Stable left thoracic inlet surgical clips. Visualized tracheal air column is within normal limits. Stable lung volumes. Normal cardiac size and mediastinal contours. No pneumothorax, pulmonary edema, pleural effusion or confluent pulmonary opacity. No acute osseous abnormality identified. Right upper quadrant surgical clips are stable.  IMPRESSION: No acute cardiopulmonary abnormality.   Electronically Signed   By: Augusto Gamble M.D.   On: 08/14/2013 12:57    EKG Interpretation   None      Results for orders placed during the hospital encounter of 08/14/13  RAPID STREP SCREEN      Result Value Range   Streptococcus, Group A Screen (Direct) NEGATIVE  NEGATIVE   Dg Chest 2 View  08/14/2013   CLINICAL DATA:  42 year old female with cough and congestion. Wheezing. Initial encounter.  EXAM: CHEST  2 VIEW  COMPARISON:  01/2013 and earlier.  FINDINGS: Stable left thoracic inlet surgical clips. Visualized tracheal air column is within normal limits. Stable lung volumes. Normal cardiac size and mediastinal contours. No pneumothorax, pulmonary edema, pleural effusion or confluent pulmonary opacity. No acute osseous abnormality identified. Right upper quadrant surgical clips are stable.  IMPRESSION: No acute cardiopulmonary abnormality.   Electronically Signed   By: Augusto Gamble M.D.   On: 08/14/2013 12:57     MDM  Bronchitits  Patient here with symptoms c/w bronchitis, plan to give abx, inhaler, cough medication, doubt PE, CAD, believe this is infectious.  I personally performed the services described in this documentation, which was scribed in my presence. The recorded information has been reviewed and is accurate.   Izola Price Marisue Humble, PA-C 08/14/13 1308

## 2013-08-14 NOTE — ED Notes (Signed)
Pt reports dry cough, with occasional wheezing, ear pain x 12 days. Denies fever.  Cough has become productive with sinus drainage

## 2013-08-14 NOTE — Telephone Encounter (Signed)
Patient Information:  Caller Name: Crystina  Phone: [phone]  Patient: Rhonda [name], Rhonda [name]  Gender: Female  DOB: [birth year]  Age: 42 Years  PCP: Eleonore Chiquito (Family Practice > 52yrs old)  Pregnant: No  Office Follow Up:  Does the office need to follow up with this patient?: No  Instructions For The Office: N/A  RN Note:  Chest pain when coughs.  Wheezing present. Noted small amount of blood in sputum.  No appointments remain at [place], [facility] or [facility].  Declined to drive to any other locations to be seen.  Reported she will have boyfirend take her to local UC.  Symptoms  Reason For Call & Symptoms: Head congestion with cough.  Reviewed Health History In EMR: Yes  Reviewed Medications In EMR: Yes  Reviewed Allergies In EMR: Yes  Reviewed Surgeries / Procedures: Yes  Date of Onset of Symptoms: 08/02/2013  Treatments Tried: Delsym, Coricidan Cough & Cold  Treatments Tried Worked: No OB / GYN:  LMP: Unknown  Guideline(s) Used:  Cough  Disposition Per Guideline:   Go to Office Now  Reason For Disposition Reached:   Wheezing is present  Advice Given:  Reassurance  Coughing is the way that our lungs remove irritants and mucus. It helps protect our lungs from getting pneumonia.  You can get a dry hacking cough after a chest cold. Sometimes this type of cough can last 1-3 weeks, and be worse at night.  Caution - Dextromethorphan:   Do not try to completely suppress coughs that produce mucus and phlegm. Remember that coughing is helpful in bringing up mucus from the lungs and preventing pneumonia.  Research Notes: Dextromethorphan in some research studies has been shown to reduce the frequency and severity of cough in adults (18 years or older) without significant adverse effects. However, other studies suggest that dextromethorphan is no better than placebo at reducing a cough.  Coughing Spasms:  Drink warm fluids. Inhale warm mist (Reason: both relax  the airway and loosen up the phlegm).  Suck on cough drops or hard candy to coat the irritated throat.  Prevent Dehydration:  Drink adequate liquids.  This will help soothe an irritated or dry throat and loosen up the phlegm.  Fever Medicines:  For fevers above 101 F (38.3 C) take either acetaminophen or ibuprofen.  Expected Course:   The expected course depends on what is causing the cough.  Viral bronchitis (chest cold) causes a cough that lasts 1 to 3 weeks. Sometimes you may cough up lots of phlegm (sputum, mucus). The mucus can normally be white, gray, yellow, or green.  Call Back If:  Difficulty breathing  Cough lasts more than 3 weeks  Fever lasts > 3 days  You become worse.  Patient Will Follow Care Advice:  YES

## 2013-08-14 NOTE — ED Provider Notes (Signed)
Medical screening examination/treatment/procedure(s) were performed by non-physician practitioner and as supervising physician I was immediately available for consultation/collaboration.  EKG Interpretation   None         William Aladdin Kollmann, MD 08/14/13 1446 

## 2013-08-31 ENCOUNTER — Other Ambulatory Visit: Payer: Self-pay | Admitting: Internal Medicine

## 2013-09-08 ENCOUNTER — Other Ambulatory Visit: Payer: Self-pay | Admitting: Internal Medicine

## 2013-09-11 ENCOUNTER — Telehealth: Payer: Self-pay | Admitting: Internal Medicine

## 2013-09-11 MED ORDER — LORAZEPAM 0.5 MG PO TABS: 0.5000 mg | ORAL_TABLET | Freq: Two times a day (BID) | ORAL | Status: DC | PRN

## 2013-09-11 NOTE — Telephone Encounter (Signed)
Left detailed message Rx called into pharmacy. 

## 2013-09-11 NOTE — Telephone Encounter (Signed)
Please advise 

## 2013-09-11 NOTE — Telephone Encounter (Signed)
Pt would like a refill on ALPRAZolam (XANAX) 0.25 MG tablet Pt states she had an episode w/ a  neighbor over the weekend and pt has not been able to sleep since then. Pt found neighbor unresponsive and she administered cpr. This has left her a liitle upset.  Would like a sleep med also. Pt would like to know if she would need to come in for appt for this? Pharm: [facility]

## 2013-09-11 NOTE — Telephone Encounter (Signed)
Lorazepam 0.5 mg #60  one twice daily as needed for anxiety or sleep

## 2013-09-22 ENCOUNTER — Other Ambulatory Visit: Payer: Self-pay | Admitting: Internal Medicine

## 2013-10-22 ENCOUNTER — Encounter (HOSPITAL_COMMUNITY): Payer: Self-pay | Admitting: Emergency Medicine

## 2013-10-22 ENCOUNTER — Emergency Department (HOSPITAL_COMMUNITY): Payer: BC Managed Care – PPO

## 2013-10-22 ENCOUNTER — Emergency Department (HOSPITAL_COMMUNITY)
Admission: EM | Admit: 2013-10-22 | Discharge: 2013-10-22 | Disposition: A | Discharge status: Home / Self Care | Payer: BC Managed Care – PPO | Attending: Emergency Medicine | Admitting: Emergency Medicine

## 2013-10-22 DIAGNOSIS — Z3202 Encounter for pregnancy test, result negative: Secondary | ICD-10-CM | POA: Insufficient documentation

## 2013-10-22 DIAGNOSIS — Z9089 Acquired absence of other organs: Secondary | ICD-10-CM | POA: Insufficient documentation

## 2013-10-22 DIAGNOSIS — K5900 Constipation, unspecified: Secondary | ICD-10-CM | POA: Insufficient documentation

## 2013-10-22 DIAGNOSIS — Z90710 Acquired absence of both cervix and uterus: Secondary | ICD-10-CM | POA: Insufficient documentation

## 2013-10-22 DIAGNOSIS — Z9851 Tubal ligation status: Secondary | ICD-10-CM | POA: Insufficient documentation

## 2013-10-22 DIAGNOSIS — Z8659 Personal history of other mental and behavioral disorders: Secondary | ICD-10-CM | POA: Insufficient documentation

## 2013-10-22 DIAGNOSIS — R109 Unspecified abdominal pain: Secondary | ICD-10-CM

## 2013-10-22 DIAGNOSIS — E079 Disorder of thyroid, unspecified: Secondary | ICD-10-CM | POA: Insufficient documentation

## 2013-10-22 DIAGNOSIS — Z79899 Other long term (current) drug therapy: Secondary | ICD-10-CM | POA: Insufficient documentation

## 2013-10-22 DIAGNOSIS — N390 Urinary tract infection, site not specified: Secondary | ICD-10-CM | POA: Insufficient documentation

## 2013-10-22 DIAGNOSIS — K219 Gastro-esophageal reflux disease without esophagitis: Secondary | ICD-10-CM | POA: Insufficient documentation

## 2013-10-22 DIAGNOSIS — I10 Essential (primary) hypertension: Secondary | ICD-10-CM | POA: Insufficient documentation

## 2013-10-22 LAB — COMPREHENSIVE METABOLIC PANEL
ALBUMIN: 3.5 g/dL (ref 3.5–5.2)
ALK PHOS: 85 U/L (ref 39–117)
ALT: 18 U/L (ref 0–35)
AST: 19 U/L (ref 0–37)
BILIRUBIN TOTAL: 0.2 mg/dL — AB (ref 0.3–1.2)
BUN: 14 mg/dL (ref 6–23)
CHLORIDE: 100 meq/L (ref 96–112)
CO2: 25 meq/L (ref 19–32)
Calcium: 9.8 mg/dL (ref 8.4–10.5)
Creatinine, Ser: 1.1 mg/dL (ref 0.50–1.10)
GFR calc Af Amer: 70 mL/min — ABNORMAL LOW (ref >90)
GFR, EST NON AFRICAN AMERICAN: 60 mL/min — AB (ref >90)
Glucose, Bld: 102 mg/dL — ABNORMAL HIGH (ref 70–99)
POTASSIUM: 4 meq/L (ref 3.7–5.3)
Sodium: 137 mEq/L (ref 137–147)
Total Protein: 8 g/dL (ref 6.0–8.3)

## 2013-10-22 LAB — CBC
HEMATOCRIT: 38.8 % (ref 36.0–46.0)
HEMOGLOBIN: 12.5 g/dL (ref 12.0–15.0)
MCH: 28 pg (ref 26.0–34.0)
MCHC: 32.2 g/dL (ref 30.0–36.0)
MCV: 87 fL (ref 78.0–100.0)
Platelets: 275 10*3/uL (ref 150–400)
RBC: 4.46 MIL/uL (ref 3.87–5.11)
RDW: 13.2 % (ref 11.5–15.5)
WBC: 7.3 10*3/uL (ref 4.0–10.5)

## 2013-10-22 LAB — URINE MICROSCOPIC-ADD ON

## 2013-10-22 LAB — URINALYSIS, ROUTINE W REFLEX MICROSCOPIC
BILIRUBIN URINE: NEGATIVE
GLUCOSE, UA: NEGATIVE mg/dL
Hgb urine dipstick: NEGATIVE
Ketones, ur: NEGATIVE mg/dL
Nitrite: POSITIVE — AB
PH: 6 (ref 5.0–8.0)
Protein, ur: 100 mg/dL — AB
SPECIFIC GRAVITY, URINE: 1.019 (ref 1.005–1.030)
UROBILINOGEN UA: 0.2 mg/dL (ref 0.0–1.0)

## 2013-10-22 LAB — LIPASE, BLOOD: LIPASE: 32 U/L (ref 11–59)

## 2013-10-22 LAB — POC URINE PREG, ED: PREG TEST UR: NEGATIVE

## 2013-10-22 MED ORDER — ONDANSETRON 8 MG PO TBDP
8.0000 mg | ORAL_TABLET | Freq: Once | ORAL | Status: AC
  Administered 2013-10-22: 8 mg via ORAL
  Filled 2013-10-22: qty 1

## 2013-10-22 MED ORDER — DICYCLOMINE HCL 10 MG PO CAPS
10.0000 mg | ORAL_CAPSULE | Freq: Once | ORAL | Status: AC
  Administered 2013-10-22: 10 mg via ORAL
  Filled 2013-10-22: qty 1

## 2013-10-22 MED ORDER — CEFTRIAXONE SODIUM 1 G IJ SOLR
1.0000 g | INTRAMUSCULAR | Status: DC
  Administered 2013-10-22: 1 g via INTRAVENOUS
  Filled 2013-10-22: qty 10

## 2013-10-22 MED ORDER — CEPHALEXIN 500 MG PO CAPS: 500.0000 mg | ORAL_CAPSULE | Freq: Four times a day (QID) | ORAL | Status: DC

## 2013-10-22 NOTE — Discharge Instructions (Signed)
The lab tests show a urine infection - take antibiotic (keflex) as prescribed.  A urine culture was sent the results of which will be back in 2 days time - have your doctor follow up on that result then.  Your xrays show a moderate amount of stool in colon consistent w your history of mild constipation.  Drink plenty of fluids, get adequate fiber in diet.  You may also take colace (stool softener) and miralax (laxative) as needed - both of these medications are available over the counter. Return to ER right away if worse, worsening or severe pain, persistent vomiting, high fevers, other concern.  Your blood pressure is high today - continue your blood pressure medication, and follow up with your doctor in the next 1-2 weeks for recheck.       Urinary Tract Infection Urinary tract infections (UTIs) can develop anywhere along your urinary tract. Your urinary tract is your body's drainage system for removing wastes and extra water. Your urinary tract includes two kidneys, two ureters, a bladder, and a urethra. Your kidneys are a pair of bean-shaped organs. Each kidney is about the size of your fist. They are located below your ribs, one on each side of your spine. CAUSES Infections are caused by microbes, which are microscopic organisms, including fungi, viruses, and bacteria. These organisms are so small that they can only be seen through a microscope. Bacteria are the microbes that most commonly cause UTIs. SYMPTOMS  Symptoms of UTIs may vary by age and gender of the patient and by the location of the infection. Symptoms in young women typically include a frequent and intense urge to urinate and a painful, burning feeling in the bladder or urethra during urination. Older women and men are more likely to be tired, shaky, and weak and have muscle aches and abdominal pain. A fever may mean the infection is in your kidneys. Other symptoms of a kidney infection include pain in your back or sides below the  ribs, nausea, and vomiting. DIAGNOSIS To diagnose a UTI, your caregiver will ask you about your symptoms. Your caregiver also will ask to provide a urine sample. The urine sample will be tested for bacteria and white blood cells. White blood cells are made by your body to help fight infection. TREATMENT  Typically, UTIs can be treated with medication. Because most UTIs are caused by a bacterial infection, they usually can be treated with the use of antibiotics. The choice of antibiotic and length of treatment depend on your symptoms and the type of bacteria causing your infection. HOME CARE INSTRUCTIONS  If you were prescribed antibiotics, take them exactly as your caregiver instructs you. Finish the medication even if you feel better after you have only taken some of the medication.  Drink enough water and fluids to keep your urine clear or pale yellow.  Avoid caffeine, tea, and carbonated beverages. They tend to irritate your bladder.  Empty your bladder often. Avoid holding urine for long periods of time.  Empty your bladder before and after sexual intercourse.  After a bowel movement, women should cleanse from front to back. Use each tissue only once. SEEK MEDICAL CARE IF:   You have back pain.  You develop a fever.  Your symptoms do not begin to resolve within 3 days. SEEK IMMEDIATE MEDICAL CARE IF:   You have severe back pain or lower abdominal pain.  You develop chills.  You have nausea or vomiting.  You have continued burning or discomfort with  urination. MAKE SURE YOU:   Understand these instructions.  Will watch your condition.  Will get help right away if you are not doing well or get worse. Document Released: 05/13/2005 Document Revised: 02/02/2012 Document Reviewed: 09/11/2011 [facility] Patient Information 2014 [place].    Constipation, Adult Constipation is when a person has fewer than 3 bowel movements a week; has difficulty having a bowel  movement; or has stools that are dry, hard, or larger than normal. As people grow older, constipation is more common. If you try to fix constipation with medicines that make you have a bowel movement (laxatives), the problem may get worse. Long-term laxative use may cause the muscles of the colon to become weak. A low-fiber diet, not taking in enough fluids, and taking certain medicines may make constipation worse. CAUSES   Certain medicines, such as antidepressants, pain medicine, iron supplements, antacids, and water pills.   Certain diseases, such as diabetes, irritable bowel syndrome (IBS), thyroid disease, or depression.   Not drinking enough water.   Not eating enough fiber-rich foods.   Stress or travel.  Lack of physical activity or exercise.  Not going to the restroom when there is the urge to have a bowel movement.  Ignoring the urge to have a bowel movement.  Using laxatives too much. SYMPTOMS   Having fewer than 3 bowel movements a week.   Straining to have a bowel movement.   Having hard, dry, or larger than normal stools.   Feeling full or bloated.   Pain in the lower abdomen.  Not feeling relief after having a bowel movement. DIAGNOSIS  Your caregiver will take a medical history and perform a physical exam. Further testing may be done for severe constipation. Some tests may include:   A barium enema X-ray to examine your rectum, colon, and sometimes, your small intestine.  A sigmoidoscopy to examine your lower colon.  A colonoscopy to examine your entire colon. TREATMENT  Treatment will depend on the severity of your constipation and what is causing it. Some dietary treatments include drinking more fluids and eating more fiber-rich foods. Lifestyle treatments may include regular exercise. If these diet and lifestyle recommendations do not help, your caregiver may recommend taking over-the-counter laxative medicines to help you have bowel movements.  Prescription medicines may be prescribed if over-the-counter medicines do not work.  HOME CARE INSTRUCTIONS   Increase dietary fiber in your diet, such as fruits, vegetables, whole grains, and beans. Limit high-fat and processed sugars in your diet, such as [place] fries, hamburgers, cookies, candies, and soda.   A fiber supplement may be added to your diet if you cannot get enough fiber from foods.   Drink enough fluids to keep your urine clear or pale yellow.   Exercise regularly or as directed by your caregiver.   Go to the restroom when you have the urge to go. Do not hold it.  Only take medicines as directed by your caregiver. Do not take other medicines for constipation without talking to your caregiver first. [place] IF:   You have bright red blood in your stool.   Your constipation lasts for more than 4 days or gets worse.   You have abdominal or rectal pain.   You have thin, pencil-like stools.  You have unexplained weight loss. MAKE SURE YOU:   Understand these instructions.  Will watch your condition.  Will get help right away if you are not doing well or get worse. Document Released: 05/01/2004  Document Revised: 10/26/2011 Document Reviewed: 05/15/2013 [employer] Patient Information 2014 [place].    Hypertension As your heart beats, it forces blood through your arteries. This force is your blood pressure. If the pressure is too high, it is called hypertension (HTN) or high blood pressure. HTN is dangerous because you may have it and not know it. High blood pressure may mean that your heart has to work harder to pump blood. Your arteries may be narrow or stiff. The extra work puts you at risk for heart disease, stroke, and other problems.  Blood pressure consists of two numbers, a higher number over a lower, 110/72, for example. It is stated as "110 over 72." The ideal is below 120 for the top number (systolic) and under 80 for the  bottom (diastolic). Write down your blood pressure today. You should pay close attention to your blood pressure if you have certain conditions such as:  Heart failure.  Prior heart attack.  Diabetes  Chronic kidney disease.  Prior stroke.  Multiple risk factors for heart disease. To see if you have HTN, your blood pressure should be measured while you are seated with your arm held at the level of the heart. It should be measured at least twice. A one-time elevated blood pressure reading (especially in the Emergency Department) does not mean that you need treatment. There may be conditions in which the blood pressure is different between your right and left arms. It is important to see your caregiver soon for a recheck. Most people have essential hypertension which means that there is not a specific cause. This type of high blood pressure may be lowered by changing lifestyle factors such as:  Stress.  Smoking.  Lack of exercise.  Excessive weight.  Drug/tobacco/alcohol use.  Eating less salt. Most people do not have symptoms from high blood pressure until it has caused damage to the body. Effective treatment can often prevent, delay or reduce that damage. TREATMENT  When a cause has been identified, treatment for high blood pressure is directed at the cause. There are a large number of medications to treat HTN. These fall into several categories, and your caregiver will help you select the medicines that are best for you. Medications may have side effects. You should review side effects with your caregiver. If your blood pressure stays high after you have made lifestyle changes or started on medicines,   Your medication(s) may need to be changed.  Other problems may need to be addressed.  Be certain you understand your prescriptions, and know how and when to take your medicine.  Be sure to follow up with your caregiver within the time frame advised (usually within two weeks) to  have your blood pressure rechecked and to review your medications.  If you are taking more than one medicine to lower your blood pressure, make sure you know how and at what times they should be taken. Taking two medicines at the same time can result in blood pressure that is too low. SEEK IMMEDIATE MEDICAL CARE IF:  You develop a severe headache, blurred or changing vision, or confusion.  You have unusual weakness or numbness, or a faint feeling.  You have severe chest or abdominal pain, vomiting, or breathing problems. MAKE SURE YOU:   Understand these instructions.  Will watch your condition.  Will get help right away if you are not doing well or get worse. Document Released: 08/03/2005 Document Revised: 10/26/2011 Document Reviewed: 03/23/2008 [facility] Patient Information 2014 [place].

## 2013-10-22 NOTE — ED Provider Notes (Signed)
CSN: [id number]     Arrival date & time 10/22/13  3500 History   First MD Initiated Contact with Patient 10/22/13 0940     Chief Complaint  Patient presents with  . Abdominal Pain     (Consider location/radiation/quality/duration/timing/severity/associated sxs/prior Treatment) Patient is a 43 y.o. female presenting with abdominal pain. The history is provided by the patient.  Abdominal Pain Associated symptoms: constipation, dysuria and nausea   Associated symptoms: no chest pain, no chills, no cough, no diarrhea, no fever, no shortness of breath, no sore throat, no vaginal bleeding and no vaginal discharge   pt with hx IBS, prior cholecystectomy and hysterectomy, c/o mid to lower abd crampy pain in past week, on and off. Dull, moderate, non radiating. No specific exacerbating or alleviating factors. Pt states recent mild constipation, and states did have normal bm 2 days ago. Passing gas, no abd distension. Nausea. No vomiting. No abd distension. No fever or chills. + mild dysuria. C/o mild dull mid to low back pain, no unilateral flank pain. No back injury/strain, no radicular pain. Normal appetite.      Past Medical History  Diagnosis Date  . Allergy   . Hypertension   . IBS (irritable bowel syndrome)   . Nephropathy   . Hyperlipemia   . Anxiety   . GERD (gastroesophageal reflux disease)   . Chronic constipation   . Kidney disease   . Thyroid disease    Past Surgical History  Procedure Laterality Date  . Abdominal hysterectomy  1998  . Cholecystectomy  1997  . Esophagogastroduodenoscopy    . Tubal ligation    . Thyroid surgery     Family History  Problem Relation Age of Onset  . Hypertension Father   . Coronary artery disease Father    History  Substance Use Topics  . Smoking status: Never Smoker   . Smokeless tobacco: Never Used  . Alcohol Use: No   OB History   Grav Para Term Preterm Abortions TAB SAB Ect Mult Living                 Review of Systems   Constitutional: Negative for fever and chills.  HENT: Negative for sore throat.   Eyes: Negative for redness.  Respiratory: Negative for cough and shortness of breath.   Cardiovascular: Negative for chest pain and leg swelling.  Gastrointestinal: Positive for nausea, abdominal pain and constipation. Negative for diarrhea.  Genitourinary: Positive for dysuria. Negative for flank pain, vaginal bleeding and vaginal discharge.  Musculoskeletal: Positive for back pain. Negative for neck pain.  Skin: Negative for rash.  Neurological: Negative for headaches.  Hematological: Does not bruise/bleed easily.  Psychiatric/Behavioral: Negative for confusion.      Allergies  Aspirin; Flagyl; Food; and Tramadol  Home Medications   Current Outpatient Rx  Name  Route  Sig  Dispense  Refill  . amLODipine (NORVASC) 5 MG tablet   Oral   Take 5 mg by mouth daily.         Marland Kitchen desloratadine (CLARINEX) 5 MG tablet   Oral   Take 5 mg by mouth daily.         Marland Kitchen docusate sodium (COLACE) 100 MG capsule   Oral   Take 100 mg by mouth daily.          Marland Kitchen levothyroxine (SYNTHROID, LEVOTHROID) 150 MCG tablet   Oral   Take 150 mcg by mouth daily before breakfast.         . metoprolol succinate (  TOPROL-XL) 50 MG 24 hr tablet   Oral   Take 50 mg by mouth daily. Take with or immediately following a meal.         . Multiple Vitamin (MULTIVITAMIN) tablet   Oral   Take 1 tablet by mouth daily.           Marland Kitchen omeprazole (PRILOSEC) 40 MG capsule   Oral   Take 40 mg by mouth daily.         . promethazine (PHENERGAN) 25 MG tablet   Oral   Take 25 mg by mouth every 6 (six) hours as needed for nausea or vomiting.         Marland Kitchen albuterol (PROVENTIL HFA;VENTOLIN HFA) 108 (90 BASE) MCG/ACT inhaler   Inhalation   Inhale 2 puffs into the lungs every 6 (six) hours as needed for wheezing.          BP 144/102  Pulse 77  Temp(Src) 98.6 F (37 C) (Oral)  Resp 16  SpO2 99% Physical Exam  Nursing  note and vitals reviewed. Constitutional: She appears well-developed and well-nourished. No distress.  HENT:  Mouth/Throat: Oropharynx is clear and moist.  Eyes: Conjunctivae are normal. No scleral icterus.  Neck: Neck supple. No tracheal deviation present.  Cardiovascular: Normal rate, regular rhythm, normal heart sounds and intact distal pulses.   Pulmonary/Chest: Effort normal and breath sounds normal. No respiratory distress.  Abdominal: Soft. Normal appearance and bowel sounds are normal. She exhibits no distension and no mass. There is no tenderness. There is no rebound and no guarding.  No incarc hernia.   Genitourinary:  No cva tenderness  Musculoskeletal: She exhibits no edema and no tenderness.  Neurological: She is alert.  Skin: Skin is warm and dry. No rash noted. She is not diaphoretic.  Psychiatric: She has a normal mood and affect.    ED Course  Procedures (including critical care time)  Results for orders placed during the hospital encounter of 10/22/13  URINALYSIS, ROUTINE W REFLEX MICROSCOPIC      Result Value Ref Range   Color, Urine YELLOW  YELLOW   APPearance CLOUDY (*) CLEAR   Specific Gravity, Urine 1.019  1.005 - 1.030   pH 6.0  5.0 - 8.0   Glucose, UA NEGATIVE  NEGATIVE mg/dL   Hgb urine dipstick NEGATIVE  NEGATIVE   Bilirubin Urine NEGATIVE  NEGATIVE   Ketones, ur NEGATIVE  NEGATIVE mg/dL   Protein, ur 383 (*) NEGATIVE mg/dL   Urobilinogen, UA 0.2  0.0 - 1.0 mg/dL   Nitrite POSITIVE (*) NEGATIVE   Leukocytes, UA SMALL (*) NEGATIVE  CBC      Result Value Ref Range   WBC 7.3  4.0 - 10.5 K/uL   RBC 4.46  3.87 - 5.11 MIL/uL   Hemoglobin 12.5  12.0 - 15.0 g/dL   HCT 33.8  32.9 - 19.1 %   MCV 87.0  78.0 - 100.0 fL   MCH 28.0  26.0 - 34.0 pg   MCHC 32.2  30.0 - 36.0 g/dL   RDW 66.0  60.0 - 45.9 %   Platelets 275  150 - 400 K/uL  URINE MICROSCOPIC-ADD ON      Result Value Ref Range   Squamous Epithelial / LPF RARE  RARE   WBC, UA 11-20  <3 WBC/hpf    Bacteria, UA MANY (*) RARE  POC URINE PREG, ED      Result Value Ref Range   Preg Test, Ur NEGATIVE  NEGATIVE  Dg Abd 1 View  10/22/2013   CLINICAL DATA:  Pain, vomiting.  EXAM: ABDOMEN - 1 VIEW  COMPARISON:  06/25/2011  FINDINGS: Moderate stool burden throughout the colon. Prior cholecystectomy. Calcified phleboliths in the anatomic pelvis. No obstruction or free air. No acute bony abnormality.  IMPRESSION: Moderate stool burden.  No acute findings.   Electronically Signed   By: Rolm Baptise M.D.   On: 10/22/2013 10:34      MDM  Labs. Xr.  Reviewed nursing notes and prior charts for additional history.   ua w 11-20 wbc, many bact - will culture and rx.  Rocephin iv. rx keflex for home. No fevers, no cva tenderness.  Kub/hx c/w constipation. Pt w bm 2 days ago, passing gas, no abd distension or tenderness.  Will rx colace/miralax prn.   Pt appears stable for d/c.       Mirna Mires, MD 10/22/13 1055

## 2013-10-22 NOTE — ED Notes (Signed)
She c/o bilat. Flank pain radiating into pelvic/bladder area x 1 week.  She also c/o nausea, but denies vomiting, fever or diarrhea. She is in no distress.

## 2013-10-24 LAB — URINE CULTURE
Colony Count: >=100000
Special Requests: NORMAL

## 2013-11-07 ENCOUNTER — Other Ambulatory Visit: Payer: Self-pay | Admitting: Internal Medicine

## 2013-11-07 DIAGNOSIS — Z1231 Encounter for screening mammogram for malignant neoplasm of breast: Secondary | ICD-10-CM

## 2013-11-10 ENCOUNTER — Ambulatory Visit: Payer: BC Managed Care – PPO | Admitting: Internal Medicine

## 2013-11-13 ENCOUNTER — Ambulatory Visit (HOSPITAL_COMMUNITY)
Admission: RE | Admit: 2013-11-13 | Discharge: 2013-11-13 | Disposition: A | Discharge status: Home / Self Care | Payer: BC Managed Care – PPO | Source: Ambulatory Visit | Attending: Internal Medicine | Admitting: Internal Medicine

## 2013-11-13 DIAGNOSIS — Z1231 Encounter for screening mammogram for malignant neoplasm of breast: Secondary | ICD-10-CM

## 2013-11-18 ENCOUNTER — Encounter (HOSPITAL_COMMUNITY): Payer: Self-pay | Admitting: Emergency Medicine

## 2013-11-18 ENCOUNTER — Emergency Department (INDEPENDENT_AMBULATORY_CARE_PROVIDER_SITE_OTHER)
Admission: EM | Admit: 2013-11-18 | Discharge: 2013-11-18 | Disposition: A | Discharge status: Home / Self Care | Payer: BC Managed Care – PPO | Source: Home / Self Care | Attending: Family Medicine | Admitting: Family Medicine

## 2013-11-18 ENCOUNTER — Other Ambulatory Visit (HOSPITAL_COMMUNITY)
Admission: RE | Admit: 2013-11-18 | Discharge: 2013-11-18 | Disposition: A | Discharge status: Home / Self Care | Payer: BC Managed Care – PPO | Source: Ambulatory Visit | Attending: Family Medicine | Admitting: Family Medicine

## 2013-11-18 DIAGNOSIS — N760 Acute vaginitis: Secondary | ICD-10-CM | POA: Insufficient documentation

## 2013-11-18 DIAGNOSIS — Z113 Encounter for screening for infections with a predominantly sexual mode of transmission: Secondary | ICD-10-CM | POA: Insufficient documentation

## 2013-11-18 LAB — POCT URINALYSIS DIP (DEVICE)
Bilirubin Urine: NEGATIVE
Glucose, UA: NEGATIVE mg/dL
HGB URINE DIPSTICK: NEGATIVE
Ketones, ur: NEGATIVE mg/dL
Leukocytes, UA: NEGATIVE
Nitrite: NEGATIVE
PH: 6 (ref 5.0–8.0)
Protein, ur: 100 mg/dL — AB
Specific Gravity, Urine: 1.025 (ref 1.005–1.030)
Urobilinogen, UA: 0.2 mg/dL (ref 0.0–1.0)

## 2013-11-18 NOTE — Discharge Instructions (Signed)
Your urine studies were normal. We will contact you by phone if results of vaginal swabs indicate the need for treatment.

## 2013-11-18 NOTE — ED Provider Notes (Signed)
CSN: [id number]     Arrival date & time 11/18/13  6440 History   First MD Initiated Contact with Patient 11/18/13 1109     Chief Complaint  Patient presents with  . Vaginal Discharge  . Back Pain   (Consider location/radiation/quality/duration/timing/severity/associated sxs/prior Treatment) Patient is a 43 y.o. female presenting with vaginal discharge and back pain. The history is provided by the patient.  Vaginal Discharge Quality:  White and milky Severity:  Mild Onset quality:  Gradual Duration:  3 days Timing:  Constant Progression:  Unchanged Chronicity:  New Ineffective treatments:  None tried Associated symptoms: vaginal itching   Associated symptoms: no dysuria, no fever, no genital lesions, no nausea, no urinary frequency and no vomiting   Risk factors: no immunosuppression, no new sexual partner, no PID and no STI exposure   Back Pain Associated symptoms: no dysuria, no fever and no pelvic pain     Past Medical History  Diagnosis Date  . Allergy   . Hypertension   . IBS (irritable bowel syndrome)   . Hyperlipemia   . Anxiety   . GERD (gastroesophageal reflux disease)   . Chronic constipation   . Thyroid disease   . Nephropathy   . Kidney disease    Past Surgical History  Procedure Laterality Date  . Abdominal hysterectomy  1998  . Cholecystectomy  1997  . Esophagogastroduodenoscopy    . Tubal ligation    . Thyroid surgery     Family History  Problem Relation Age of Onset  . Hypertension Father   . Coronary artery disease Father    History  Substance Use Topics  . Smoking status: Never Smoker   . Smokeless tobacco: Never Used  . Alcohol Use: No   OB History   Grav Para Term Preterm Abortions TAB SAB Ect Mult Living                 Review of Systems  Constitutional: Negative for fever.  Respiratory: Negative.   Gastrointestinal: Negative.  Negative for nausea and vomiting.  Genitourinary: Positive for vaginal discharge. Negative for dysuria,  frequency, flank pain, decreased urine volume, vaginal bleeding, genital sores, vaginal pain and pelvic pain.  Musculoskeletal: Positive for back pain.    Allergies  Aspirin; Food; and Tramadol  Home Medications   Current Outpatient Rx  Name  Route  Sig  Dispense  Refill  . amLODipine (NORVASC) 5 MG tablet   Oral   Take 5 mg by mouth daily.         Marland Kitchen levothyroxine (SYNTHROID, LEVOTHROID) 150 MCG tablet   Oral   Take 150 mcg by mouth daily before breakfast.         . metoprolol succinate (TOPROL-XL) 50 MG 24 hr tablet   Oral   Take 50 mg by mouth daily. Take with or immediately following a meal.         . omeprazole (PRILOSEC) 40 MG capsule   Oral   Take 40 mg by mouth daily.         . valsartan (DIOVAN) 160 MG tablet   Oral   Take 160 mg by mouth daily.         Marland Kitchen albuterol (PROVENTIL HFA;VENTOLIN HFA) 108 (90 BASE) MCG/ACT inhaler   Inhalation   Inhale 2 puffs into the lungs every 6 (six) hours as needed for wheezing.         . cephALEXin (KEFLEX) 500 MG capsule   Oral   Take 1 capsule (500 mg  total) by mouth 4 (four) times daily.   28 capsule   0   . desloratadine (CLARINEX) 5 MG tablet   Oral   Take 5 mg by mouth daily.         Marland Kitchen docusate sodium (COLACE) 100 MG capsule   Oral   Take 100 mg by mouth daily.          . Multiple Vitamin (MULTIVITAMIN) tablet   Oral   Take 1 tablet by mouth daily.           . promethazine (PHENERGAN) 25 MG tablet   Oral   Take 25 mg by mouth every 6 (six) hours as needed for nausea or vomiting.          BP 146/93  Pulse 72  Temp(Src) 98.1 F (36.7 C) (Oral)  Resp 16  SpO2 99% Physical Exam  Nursing note and vitals reviewed. Constitutional: She is oriented to person, place, and time. She appears well-developed and well-nourished. No distress.  HENT:  Head: Normocephalic and atraumatic.  Eyes: Conjunctivae are normal. No scleral icterus.  Cardiovascular: Normal rate, regular rhythm and normal heart  sounds.   Pulmonary/Chest: Effort normal and breath sounds normal. No respiratory distress. She has no wheezes.  Genitourinary: Pelvic exam was performed with patient supine. There is no rash, tenderness or lesion on the right labia. There is no rash, tenderness or lesion on the left labia. No erythema, tenderness or bleeding around the vagina. No foreign body around the vagina. No signs of injury around the vagina. Vaginal discharge found.  S/P TAH in 1998  Musculoskeletal: Normal range of motion.  Neurological: She is alert and oriented to person, place, and time.  Skin: Skin is warm and dry.  Psychiatric: She has a normal mood and affect. Her behavior is normal.    ED Course  Procedures (including critical care time) Labs Review Labs Reviewed  POCT URINALYSIS DIP (DEVICE) - Abnormal; Notable for the following:    Protein, ur 100 (*)    All other components within normal limits  CERVICOVAGINAL ANCILLARY ONLY   Imaging Review No results found.   MDM   1. Vaginitis    Patient reports that she has significant GI upset when she takes flagyl. Would like to wait on results of swabs before initiating treatment. Advised her that we would notify her by phone if results indicated need for treatment. UA normal.    Annett Gula [place], [place] 11/18/13 [phone]

## 2013-11-18 NOTE — ED Notes (Signed)
C/O slight "milky" vaginal discharge with pruritis, burning, irritation x 3 days.  Also c/o low back and low abd discomfort.  Denies fevers.  Reports abx use approx 1 month ago.

## 2013-11-18 NOTE — ED Provider Notes (Signed)
Medical screening examination/treatment/procedure(s) were performed by resident physician or non-physician practitioner and as supervising physician I was immediately available for consultation/collaboration.   Pauline Good MD.   Billy Fischer, MD 11/18/13 1754

## 2013-11-20 ENCOUNTER — Other Ambulatory Visit: Payer: Self-pay | Admitting: Internal Medicine

## 2013-11-20 LAB — CERVICOVAGINAL ANCILLARY ONLY
Chlamydia: NEGATIVE
Neisseria Gonorrhea: NEGATIVE
Wet Prep (BD Affirm): NEGATIVE
Wet Prep (BD Affirm): NEGATIVE
Wet Prep (BD Affirm): POSITIVE — AB

## 2013-11-20 MED ORDER — METRONIDAZOLE 0.75 % VA GEL: 1.0000 | Freq: Every day | VAGINAL | Status: DC

## 2013-11-20 NOTE — ED Notes (Signed)
GC/Chlamydia neg., Affirm: Candida and Trich neg., Gardnerella pos.  Message sent to [facility] PA. Roselyn Meier 11/20/2013

## 2013-11-21 ENCOUNTER — Telehealth (HOSPITAL_COMMUNITY): Payer: Self-pay | Admitting: *Deleted

## 2013-11-21 NOTE — ED Notes (Signed)
4/6 Dot Been PA e-prescribed Metrogel to pt.'s pharmacy.  4/7 I called pt. Pt. verified x 2 and given results.  Pt. told she needs Metrogel for bacterial vaginosis and where to pick up her Rx. She had no questions. Roselyn Meier 11/21/2013

## 2013-11-28 ENCOUNTER — Encounter (HOSPITAL_COMMUNITY): Payer: Self-pay | Admitting: Emergency Medicine

## 2013-11-28 ENCOUNTER — Emergency Department (HOSPITAL_COMMUNITY)
Admission: EM | Admit: 2013-11-28 | Discharge: 2013-11-28 | Disposition: A | Discharge status: Home / Self Care | Payer: BC Managed Care – PPO | Attending: Emergency Medicine | Admitting: Emergency Medicine

## 2013-11-28 DIAGNOSIS — Z87448 Personal history of other diseases of urinary system: Secondary | ICD-10-CM | POA: Insufficient documentation

## 2013-11-28 DIAGNOSIS — K219 Gastro-esophageal reflux disease without esophagitis: Secondary | ICD-10-CM | POA: Insufficient documentation

## 2013-11-28 DIAGNOSIS — Z8659 Personal history of other mental and behavioral disorders: Secondary | ICD-10-CM | POA: Insufficient documentation

## 2013-11-28 DIAGNOSIS — M79609 Pain in unspecified limb: Secondary | ICD-10-CM | POA: Insufficient documentation

## 2013-11-28 DIAGNOSIS — I10 Essential (primary) hypertension: Secondary | ICD-10-CM | POA: Insufficient documentation

## 2013-11-28 DIAGNOSIS — IMO0002 Reserved for concepts with insufficient information to code with codable children: Secondary | ICD-10-CM | POA: Insufficient documentation

## 2013-11-28 DIAGNOSIS — E079 Disorder of thyroid, unspecified: Secondary | ICD-10-CM | POA: Insufficient documentation

## 2013-11-28 DIAGNOSIS — M5416 Radiculopathy, lumbar region: Secondary | ICD-10-CM

## 2013-11-28 DIAGNOSIS — Z792 Long term (current) use of antibiotics: Secondary | ICD-10-CM | POA: Insufficient documentation

## 2013-11-28 DIAGNOSIS — Z79899 Other long term (current) drug therapy: Secondary | ICD-10-CM | POA: Insufficient documentation

## 2013-11-28 MED ORDER — OXYCODONE-ACETAMINOPHEN 5-325 MG PO TABS: 1.0000 | ORAL_TABLET | ORAL | Status: DC | PRN

## 2013-11-28 MED ORDER — OXYCODONE-ACETAMINOPHEN 5-325 MG PO TABS
1.0000 | ORAL_TABLET | Freq: Once | ORAL | Status: AC
  Administered 2013-11-28: 1 via ORAL
  Filled 2013-11-28: qty 1

## 2013-11-28 MED ORDER — PREDNISONE 20 MG PO TABS
60.0000 mg | ORAL_TABLET | Freq: Once | ORAL | Status: AC
  Administered 2013-11-28: 60 mg via ORAL
  Filled 2013-11-28: qty 3

## 2013-11-28 MED ORDER — PREDNISONE 10 MG PO TABS: ORAL_TABLET | ORAL | Status: DC

## 2013-11-28 MED ORDER — CYCLOBENZAPRINE HCL 10 MG PO TABS: 10.0000 mg | ORAL_TABLET | Freq: Two times a day (BID) | ORAL | Status: DC | PRN

## 2013-11-28 NOTE — Discharge Instructions (Signed)
Take prednisone as prescribed until all gone, next dose tomorrow. Take Percocet as prescribed as needed for pain. Take Flexeril for muscle spasms as needed. Avoid staying in bed all day, walk around the house, stretched gently. Follow with primary care doctor.    Lumbosacral Radiculopathy Lumbosacral radiculopathy is a pinched nerve or nerves in the low back (lumbosacral area). When this happens you may have weakness in your legs and may not be able to stand on your toes. You may have pain going down into your legs. There may be difficulties with walking normally. There are many causes of this problem. Sometimes this may happen from an injury, or simply from arthritis or boney problems. It may also be caused by other illnesses such as diabetes. If there is no improvement after treatment, further studies may be done to find the exact cause. DIAGNOSIS  X-rays may be needed if the problems become long standing. Electromyograms may be done. This study is one in which the working of nerves and muscles is studied. HOME CARE INSTRUCTIONS   Applications of ice packs may be helpful. Ice can be used in a plastic bag with a towel around it to prevent frostbite to skin. This may be used every 2 hours for 20 to 30 minutes, or as needed, while awake, or as directed by your caregiver.  Only take over-the-counter or prescription medicines for pain, discomfort, or fever as directed by your caregiver.  If physical therapy was prescribed, follow your caregiver's directions. SEEK IMMEDIATE MEDICAL CARE IF:   You have pain not controlled with medications.  You seem to be getting worse rather than better.  You develop increasing weakness in your legs.  You develop loss of bowel or bladder control.  You have difficulty with walking or balance, or develop clumsiness in the use of your legs.  You have a fever. MAKE SURE YOU:   Understand these instructions.  Will watch your condition.  Will get help right  away if you are not doing well or get worse. Document Released: 08/03/2005 Document Revised: 10/26/2011 Document Reviewed: 03/23/2008 [facility] Patient Information 2014 [place].

## 2013-11-28 NOTE — ED Provider Notes (Signed)
Medical screening examination/treatment/procedure(s) were performed by non-physician practitioner and as supervising physician I was immediately available for consultation/collaboration.   EKG Interpretation None       Orpah Greek, MD 11/28/13 1039

## 2013-11-28 NOTE — ED Notes (Addendum)
Pt presents with NAD. Back and left leg pain x 2 weeks. Increased pain since Thursday.  Denies injury. Denies numbness and itingling. Pt states can radiate from side to side yet greater to left side. Pt is sharp

## 2013-11-28 NOTE — ED Provider Notes (Signed)
CSN: [id number]     Arrival date & time 11/28/13  2542 History   First MD Initiated Contact with Patient 11/28/13 1008     Chief Complaint  Patient presents with  . Back Pain  . Leg Pain     (Consider location/radiation/quality/duration/timing/severity/associated sxs/prior Treatment) HPI Rhonda [name] is a 43 y.o. female who presents to ED with complaint of back pain for 2 weeks. Pain is sharp, radiates into left leg, pain is in bilateral lower back. No injuries. No numbness or weakness in extremities. No fever or chills. No abd pain. No urinary incontinence or retention, no problems with bowels. Pain is worsened with movement. Tried taking hot baths with no relief. Tried tylenol as well. No medications tried. Had similar pain several years ago, was not given a diagnosis.   Past Medical History  Diagnosis Date  . Allergy   . Hypertension   . IBS (irritable bowel syndrome)   . Hyperlipemia   . Anxiety   . GERD (gastroesophageal reflux disease)   . Chronic constipation   . Thyroid disease   . Nephropathy   . Kidney disease    Past Surgical History  Procedure Laterality Date  . Abdominal hysterectomy  1998  . Cholecystectomy  1997  . Esophagogastroduodenoscopy    . Tubal ligation    . Thyroid surgery     Family History  Problem Relation Age of Onset  . Hypertension Father   . Coronary artery disease Father    History  Substance Use Topics  . Smoking status: Never Smoker   . Smokeless tobacco: Never Used  . Alcohol Use: No   OB History   Grav Para Term Preterm Abortions TAB SAB Ect Mult Living                 Review of Systems  Constitutional: Negative for fever and chills.  Respiratory: Negative for cough, chest tightness and shortness of breath.   Cardiovascular: Negative for chest pain, palpitations and leg swelling.  Gastrointestinal: Negative for nausea, vomiting, abdominal pain and diarrhea.  Genitourinary: Negative for dysuria, flank pain, vaginal  bleeding, vaginal discharge, vaginal pain and pelvic pain.  Musculoskeletal: Positive for arthralgias and back pain. Negative for myalgias, neck pain and neck stiffness.  Skin: Negative for rash.  Neurological: Negative for dizziness, weakness, numbness and headaches.  All other systems reviewed and are negative.     Allergies  Aspirin; Food; and Tramadol  Home Medications   Prior to Admission medications   Medication Sig Start Date End Date Taking? Authorizing Provider  albuterol (PROVENTIL HFA;VENTOLIN HFA) 108 (90 BASE) MCG/ACT inhaler Inhale 2 puffs into the lungs every 6 (six) hours as needed for wheezing.    Historical Provider, MD  amLODipine (NORVASC) 5 MG tablet Take 5 mg by mouth daily.    Historical Provider, MD  cephALEXin (KEFLEX) 500 MG capsule Take 1 capsule (500 mg total) by mouth 4 (four) times daily. 10/22/13   Mirna Mires, MD  desloratadine (CLARINEX) 5 MG tablet Take 5 mg by mouth daily.    Historical Provider, MD  docusate sodium (COLACE) 100 MG capsule Take 100 mg by mouth daily.     Historical Provider, MD  levothyroxine (SYNTHROID, LEVOTHROID) 150 MCG tablet Take 150 mcg by mouth daily before breakfast.    Historical Provider, MD  metoprolol succinate (TOPROL-XL) 50 MG 24 hr tablet Take 50 mg by mouth daily. Take with or immediately following a meal.    Historical Provider, MD  metroNIDAZOLE (METROGEL  VAGINAL) 0.75 % vaginal gel Place 1 Applicatorful vaginally at bedtime. X 7 nights 11/20/13   Lahoma Rocker, PA  Multiple Vitamin (MULTIVITAMIN) tablet Take 1 tablet by mouth daily.      Historical Provider, MD  omeprazole (PRILOSEC) 40 MG capsule Take 40 mg by mouth daily.    Historical Provider, MD  promethazine (PHENERGAN) 25 MG tablet Take 25 mg by mouth every 6 (six) hours as needed for nausea or vomiting.    Historical Provider, MD  valsartan (DIOVAN) 160 MG tablet Take 160 mg by mouth daily.    Historical Provider, MD   BP 141/99  Pulse 82  Temp(Src)  98.6 F (37 C) (Oral)  Resp 18  Ht 5' 6.5" (1.689 m)  Wt 217 lb (98.431 kg)  BMI 34.50 kg/m2  SpO2 100% Physical Exam  Nursing note and vitals reviewed. Constitutional: She is oriented to person, place, and time. She appears well-developed and well-nourished. No distress.  HENT:  Head: Normocephalic.  Eyes: Conjunctivae are normal.  Neck: Neck supple.  Cardiovascular: Normal rate, regular rhythm and normal heart sounds.   Pulmonary/Chest: Effort normal and breath sounds normal. No respiratory distress. She has no wheezes. She has no rales.  Abdominal: Soft. Bowel sounds are normal. She exhibits no distension. There is no tenderness. There is no rebound.  Musculoskeletal: She exhibits no edema.  Tenderness to palpation over the left lower paravertebral burst in his left SI joint. Tenderness extends into the left buttock. Pain with left straight leg raise. No midline spine tenderness.  Neurological: She is alert and oriented to person, place, and time.  5/5 and equal lower extremity strength. 2+ and equal patellar reflexes bilaterally. Pt able to dorsiflex bilateral toes and feet with good strength against resistance. Equal sensation bilaterally over thighs and lower legs.   Skin: Skin is warm and dry.  Psychiatric: She has a normal mood and affect. Her behavior is normal.    ED Course  Procedures (including critical care time) Labs Review Labs Reviewed - No data to display  Imaging Review No results found.   EKG Interpretation None      MDM   Final diagnoses:  Lumbar radiculopathy   Patient with nontraumatic left lower back pain radiating into the left leg. There is no numbness or weakness in the exam. There is no red flags suggest cauda equina. There is no urinary symptoms, no vaginal symptoms, no abdominal pain. Pain is worsened with movement and walking. Exam is consistent with sciatica. No further imaging indicated this time. Patient is unable to take NSAIDs due to  prior kidney disease, will try Percocet for pain, short for a new zone taper, muscle relaxant, followup with primary care Dr. Patient states she has an appointment scheduled for exists from today. She was given careful precautions to return if her symptoms worsen or change.  Filed Vitals:   11/28/13 1000  BP: 141/99  Pulse: 82  Temp: 98.6 F (37 C)  Resp: 18      Makhiya Coburn A Margarita Croke, PA-C 11/28/13 1038

## 2013-12-04 ENCOUNTER — Ambulatory Visit (INDEPENDENT_AMBULATORY_CARE_PROVIDER_SITE_OTHER): Payer: BC Managed Care – PPO | Admitting: Internal Medicine

## 2013-12-04 ENCOUNTER — Encounter: Payer: Self-pay | Admitting: Internal Medicine

## 2013-12-04 VITALS — BP 130/80 | HR 77 | Temp 98.6°F | Resp 20 | Ht 66.5 in | Wt 215.0 lb

## 2013-12-04 DIAGNOSIS — N289 Disorder of kidney and ureter, unspecified: Secondary | ICD-10-CM

## 2013-12-04 DIAGNOSIS — E039 Hypothyroidism, unspecified: Secondary | ICD-10-CM

## 2013-12-04 DIAGNOSIS — E785 Hyperlipidemia, unspecified: Secondary | ICD-10-CM

## 2013-12-04 DIAGNOSIS — M549 Dorsalgia, unspecified: Secondary | ICD-10-CM

## 2013-12-04 DIAGNOSIS — I10 Essential (primary) hypertension: Secondary | ICD-10-CM

## 2013-12-04 MED ORDER — PROMETHAZINE HCL 25 MG PO TABS: 25.0000 mg | ORAL_TABLET | Freq: Four times a day (QID) | ORAL | Status: DC | PRN

## 2013-12-04 NOTE — Patient Instructions (Signed)
Limit your sodium (Salt) intake  Please check your blood pressure on a regular basis.  If it is consistently greater than 150/90, please make an office appointment.  Return in 6 months for follow-up  Back Injury Prevention Back injuries can be extremely painful and difficult to heal. After having one back injury, you are much more likely to experience another later on. It is important to learn how to avoid injuring or re-injuring your back. The following tips can help you to prevent a back injury. PHYSICAL FITNESS  Exercise regularly and try to develop good tone in your abdominal muscles. Your abdominal muscles provide a lot of the support needed by your back.  Do aerobic exercises (walking, jogging, biking, swimming) regularly.  Do exercises that increase balance and strength (tai chi, yoga) regularly. This can decrease your risk of falling and injuring your back.  Stretch before and after exercising.  Maintain a healthy weight. The more you weigh, the more stress is placed on your back. For every pound of weight, 10 times that amount of pressure is placed on the back. DIET  Talk to your caregiver about how much calcium and vitamin D you need per day. These nutrients help to prevent weakening of the bones (osteoporosis). Osteoporosis can cause broken (fractured) bones that lead to back pain.  Include good sources of calcium in your diet, such as dairy products, green, leafy vegetables, and products with calcium added (fortified).  Include good sources of vitamin D in your diet, such as milk and foods that are fortified with vitamin D.  Consider taking a nutritional supplement or a multivitamin if needed.  Stop smoking if you smoke. POSTURE  Sit and stand up straight. Avoid leaning forward when you sit or hunching over when you stand.  Choose chairs with good low back (lumbar) support.  If you work at a desk, sit close to your work so you do not need to lean over. Keep your chin  tucked in. Keep your neck drawn back and elbows bent at a right angle. Your arms should look like the letter "L."  Sit high and close to the steering wheel when you drive. Add a lumbar support to your car seat if needed.  Avoid sitting or standing in one position for too long. Take breaks to get up, stretch, and walk around at least once every hour. Take breaks if you are driving for long periods of time.  Sleep on your side with your knees slightly bent, or sleep on your back with a pillow under your knees. Do not sleep on your stomach. LIFTING, TWISTING, AND REACHING  Avoid heavy lifting, especially repetitive lifting. If you must do heavy lifting:  Stretch before lifting.  Work slowly.  Rest between lifts.  Use carts and dollies to move objects when possible.  Make several small trips instead of carrying 1 heavy load.  Ask for help when you need it.  Ask for help when moving big, awkward objects.  Follow these steps when lifting:  Stand with your feet shoulder-width apart.  Get as close to the object as you can. Do not try to pick up heavy objects that are far from your body.  Use handles or lifting straps if they are available.  Bend at your knees. Squat down, but keep your heels off the floor.  Keep your shoulders pulled back, your chin tucked in, and your back straight.  Lift the object slowly, tightening the muscles in your legs, abdomen, and buttocks. Keep  the object as close to the center of your body as possible.  When you put a load down, use these same guidelines in reverse.  Do not:  Lift the object above your waist.  Twist at the waist while lifting or carrying a load. Move your feet if you need to turn, not your waist.  Bend over without bending at your knees.  Avoid reaching over your head, across a table, or for an object on a high surface. OTHER TIPS  Avoid wet floors and keep sidewalks clear of ice to prevent falls.  Do not sleep on a mattress  that is too soft or too hard.  Keep items that are used frequently within easy reach.  Put heavier objects on shelves at waist level and lighter objects on lower or higher shelves.  Find ways to decrease your stress, such as exercise, massage, or relaxation techniques. Stress can build up in your muscles. Tense muscles are more vulnerable to injury.  Seek treatment for depression or anxiety if needed. These conditions can increase your risk of developing back pain. SEEK MEDICAL CARE IF:  You injure your back.  You have questions about diet, exercise, or other ways to prevent back injuries. MAKE SURE YOU:  Understand these instructions.  Will watch your condition.  Will get help right away if you are not doing well or get worse. Document Released: 09/10/2004 Document Revised: 10/26/2011 Document Reviewed: 09/14/2011 [facility] Patient Information 2014 [place], [place]. Sciatica Sciatica is pain, weakness, numbness, or tingling along the path of the sciatic nerve. The nerve starts in the lower back and runs down the back of each leg. The nerve controls the muscles in the lower leg and in the back of the knee, while also providing sensation to the back of the thigh, lower leg, and the sole of your foot. Sciatica is a symptom of another medical condition. For instance, nerve damage or certain conditions, such as a herniated disk or bone spur on the spine, pinch or put pressure on the sciatic nerve. This causes the pain, weakness, or other sensations normally associated with sciatica. Generally, sciatica only affects one side of the body. CAUSES   Herniated or slipped disc.  Degenerative disk disease.  A pain disorder involving the narrow muscle in the buttocks (piriformis syndrome).  Pelvic injury or fracture.  Pregnancy.  Tumor (rare). SYMPTOMS  Symptoms can vary from mild to very severe. The symptoms usually travel from the low back to the buttocks and down the back of the leg.  Symptoms can include:  Mild tingling or dull aches in the lower back, leg, or hip.  Numbness in the back of the calf or sole of the foot.  Burning sensations in the lower back, leg, or hip.  Sharp pains in the lower back, leg, or hip.  Leg weakness.  Severe back pain inhibiting movement. These symptoms may get worse with coughing, sneezing, laughing, or prolonged sitting or standing. Also, being overweight may worsen symptoms. DIAGNOSIS  Your caregiver will perform a physical exam to look for common symptoms of sciatica. He or she may ask you to do certain movements or activities that would trigger sciatic nerve pain. Other tests may be performed to find the cause of the sciatica. These may include:  Blood tests.  X-rays.  Imaging tests, such as an MRI or CT scan. TREATMENT  Treatment is directed at the cause of the sciatic pain. Sometimes, treatment is not necessary and the pain and discomfort goes away on  its own. If treatment is needed, your caregiver may suggest:  Over-the-counter medicines to relieve pain.  Prescription medicines, such as anti-inflammatory medicine, muscle relaxants, or narcotics.  Applying heat or ice to the painful area.  Steroid injections to lessen pain, irritation, and inflammation around the nerve.  Reducing activity during periods of pain.  Exercising and stretching to strengthen your abdomen and improve flexibility of your spine. Your caregiver may suggest losing weight if the extra weight makes the back pain worse.  Physical therapy.  Surgery to eliminate what is pressing or pinching the nerve, such as a bone spur or part of a herniated disk. HOME CARE INSTRUCTIONS   Only take over-the-counter or prescription medicines for pain or discomfort as directed by your caregiver.  Apply ice to the affected area for 20 minutes, 3 4 times a day for the first 48 72 hours. Then try heat in the same way.  Exercise, stretch, or perform your usual  activities if these do not aggravate your pain.  Attend physical therapy sessions as directed by your caregiver.  Keep all follow-up appointments as directed by your caregiver.  Do not wear high heels or shoes that do not provide proper support.  Check your mattress to see if it is too soft. A firm mattress may lessen your pain and discomfort. SEEK IMMEDIATE MEDICAL CARE IF:   You lose control of your bowel or bladder (incontinence).  You have increasing weakness in the lower back, pelvis, buttocks, or legs.  You have redness or swelling of your back.  You have a burning sensation when you urinate.  You have pain that gets worse when you lie down or awakens you at night.  Your pain is worse than you have experienced in the past.  Your pain is lasting longer than 4 weeks.  You are suddenly losing weight without reason. MAKE SURE YOU:  Understand these instructions.  Will watch your condition.  Will get help right away if you are not doing well or get worse. Document Released: 07/28/2001 Document Revised: 02/02/2012 Document Reviewed: 12/13/2011 [facility] Patient Information 2014 [place].

## 2013-12-04 NOTE — Progress Notes (Signed)
Pre-visit discussion using our clinic review tool. No additional management support is needed unless otherwise documented below in the visit note.  

## 2013-12-04 NOTE — Progress Notes (Signed)
Subjective:    Patient ID: Rhonda [name], female    DOB: [birth year], 43 y.o.   MRN: [id number]  HPI  43 year old patient who has a history of treated hypertension, as well as chronic kidney disease.  She does have a history of intermittent low back pain.  She is seen today from a ED visit about 6 days ago.  Her left lower back pain with left-sided sciatica is much improved. Denies any motor weakness.  Has not required any analgesics.  She has recently completed a course of prednisone therapy.  Since her last visit here in October.  She has had 5 ED visits. Medical record reviewed  Past Medical History  Diagnosis Date  . Allergy   . Hypertension   . IBS (irritable bowel syndrome)   . Hyperlipemia   . Anxiety   . GERD (gastroesophageal reflux disease)   . Chronic constipation   . Thyroid disease   . Nephropathy   . Kidney disease     History   Social History  . Marital Status: Divorced    Spouse Name: N/A    Number of Children: 2  . Years of Education: N/A   Occupational History  . NURSING ASSISTANT    Social History Main Topics  . Smoking status: Never Smoker   . Smokeless tobacco: Never Used  . Alcohol Use: No  . Drug Use: No  . Sexual Activity: Yes    Birth Control/ Protection: Surgical   Other Topics Concern  . Not on file   Social History Narrative  . No narrative on file    Past Surgical History  Procedure Laterality Date  . Abdominal hysterectomy  1998  . Cholecystectomy  1997  . Esophagogastroduodenoscopy    . Tubal ligation    . Thyroid surgery      Family History  Problem Relation Age of Onset  . Hypertension Father   . Coronary artery disease Father     Allergies  Allergen Reactions  . Aspirin Hives  . Food Other (See Comments)    Lima beans  . Tramadol Other (See Comments)    Headache     Current Outpatient Prescriptions on File Prior to Visit  Medication Sig Dispense Refill  . albuterol (PROVENTIL HFA;VENTOLIN HFA) 108 (90  BASE) MCG/ACT inhaler Inhale 2 puffs into the lungs every 6 (six) hours as needed for wheezing.      Marland Kitchen amLODipine (NORVASC) 5 MG tablet Take 5 mg by mouth daily.      . cyclobenzaprine (FLEXERIL) 10 MG tablet Take 1 tablet (10 mg total) by mouth 2 (two) times daily as needed for muscle spasms.  10 tablet  0  . desloratadine (CLARINEX) 5 MG tablet Take 5 mg by mouth daily.      Marland Kitchen docusate sodium (COLACE) 100 MG capsule Take 100 mg by mouth daily.       Marland Kitchen levothyroxine (SYNTHROID, LEVOTHROID) 150 MCG tablet Take 150 mcg by mouth daily before breakfast.      . metoprolol succinate (TOPROL-XL) 50 MG 24 hr tablet Take 50 mg by mouth daily. Take with or immediately following a meal.      . Multiple Vitamin (MULTIVITAMIN) tablet Take 1 tablet by mouth daily.        Marland Kitchen omeprazole (PRILOSEC) 40 MG capsule Take 40 mg by mouth daily.      . promethazine (PHENERGAN) 25 MG tablet Take 25 mg by mouth every 6 (six) hours as needed for nausea or vomiting.      Marland Kitchen  valsartan (DIOVAN) 160 MG tablet Take 160 mg by mouth daily.      Marland Kitchen oxyCODONE-acetaminophen (PERCOCET/ROXICET) 5-325 MG per tablet Take 1 tablet by mouth every 4 (four) hours as needed for severe pain.  15 tablet  0   Current Facility-Administered Medications on File Prior to Visit  Medication Dose Route Frequency Provider Last Rate Last Dose  . 0.9 %  sodium chloride infusion  500 mL Intravenous Continuous Inda Castle, MD        BP 130/80  Pulse 77  Temp(Src) 98.6 F (37 C) (Oral)  Resp 20  Ht 5' 6.5" (1.689 m)  Wt 215 lb (97.523 kg)  BMI 34.19 kg/m2  SpO2 98%     Review of Systems  Constitutional: Negative.   HENT: Negative for congestion, dental problem, hearing loss, rhinorrhea, sinus pressure, sore throat and tinnitus.   Eyes: Negative for pain, discharge and visual disturbance.  Respiratory: Negative for cough and shortness of breath.   Cardiovascular: Negative for chest pain, palpitations and leg swelling.  Gastrointestinal:  Negative for nausea, vomiting, abdominal pain, diarrhea, constipation, blood in stool and abdominal distention.  Genitourinary: Negative for dysuria, urgency, frequency, hematuria, flank pain, vaginal bleeding, vaginal discharge, difficulty urinating, vaginal pain and pelvic pain.  Musculoskeletal: Positive for back pain. Negative for arthralgias, gait problem and joint swelling.  Skin: Negative for rash.  Neurological: Negative for dizziness, syncope, speech difficulty, weakness, numbness and headaches.  Hematological: Negative for adenopathy.  Psychiatric/Behavioral: Negative for behavioral problems, dysphoric mood and agitation. The patient is not nervous/anxious.        Objective:   Physical Exam  Constitutional: She is oriented to person, place, and time. She appears well-developed and well-nourished.  HENT:  Head: Normocephalic.  Right Ear: External ear normal.  Left Ear: External ear normal.  Mouth/Throat: Oropharynx is clear and moist.  Eyes: Conjunctivae and EOM are normal. Pupils are equal, round, and reactive to light.  Neck: Normal range of motion. Neck supple. No thyromegaly present.  Cardiovascular: Normal rate, regular rhythm, normal heart sounds and intact distal pulses.   Pulmonary/Chest: Effort normal and breath sounds normal.  Abdominal: Soft. Bowel sounds are normal. She exhibits no mass. There is no tenderness.  Musculoskeletal: Normal range of motion.  Negative straight leg test No motor weakness Ankle flexion and extension normal  Reflexes blunted, but symmetrical  Lymphadenopathy:    She has no cervical adenopathy.  Neurological: She is alert and oriented to person, place, and time.  Skin: Skin is warm and dry. No rash noted.  Psychiatric: She has a normal mood and affect. Her behavior is normal.          Assessment & Plan:   Resolving sciatica.  Will avoid anti-inflammatories in view of her chronic kidney disease.  The patient is much improved  following prednisone therapy.  Does not require analgesics Hypertension well controlled at present.  We'll continue triple drug therapy Dyslipidemia Hypothyroidism Remote history of PE  Return office visit 6 months or as needed

## 2013-12-05 ENCOUNTER — Telehealth: Payer: Self-pay | Admitting: Internal Medicine

## 2013-12-05 NOTE — Telephone Encounter (Signed)
Relevant patient education assigned to patient using Emmi. ° °

## 2014-01-12 ENCOUNTER — Other Ambulatory Visit: Payer: Self-pay | Admitting: Internal Medicine

## 2014-02-01 ENCOUNTER — Other Ambulatory Visit: Payer: Self-pay | Admitting: Internal Medicine

## 2014-02-10 ENCOUNTER — Encounter: Payer: Self-pay | Admitting: Internal Medicine

## 2014-02-10 ENCOUNTER — Ambulatory Visit (INDEPENDENT_AMBULATORY_CARE_PROVIDER_SITE_OTHER): Payer: BC Managed Care – PPO | Admitting: Internal Medicine

## 2014-02-10 VITALS — BP 110/70 | Temp 98.7°F | Wt 210.0 lb

## 2014-02-10 DIAGNOSIS — M5430 Sciatica, unspecified side: Secondary | ICD-10-CM

## 2014-02-10 DIAGNOSIS — R799 Abnormal finding of blood chemistry, unspecified: Secondary | ICD-10-CM

## 2014-02-10 DIAGNOSIS — R7989 Other specified abnormal findings of blood chemistry: Secondary | ICD-10-CM

## 2014-02-10 MED ORDER — HYDROCODONE-ACETAMINOPHEN 5-325 MG PO TABS: ORAL_TABLET | ORAL | Status: DC

## 2014-02-10 MED ORDER — PREDNISONE 10 MG PO TABS: ORAL_TABLET | ORAL | Status: DC

## 2014-02-10 NOTE — Patient Instructions (Signed)
pred trial pain med if needed. Expect imjprovement over the next 2 weeks and if not FU Dr Raliegh Ip and plan .   Sciatica Sciatica is pain, weakness, numbness, or tingling along the path of the sciatic nerve. The nerve starts in the lower back and runs down the back of each leg. The nerve controls the muscles in the lower leg and in the back of the knee, while also providing sensation to the back of the thigh, lower leg, and the sole of your foot. Sciatica is a symptom of another medical condition. For instance, nerve damage or certain conditions, such as a herniated disk or bone spur on the spine, pinch or put pressure on the sciatic nerve. This causes the pain, weakness, or other sensations normally associated with sciatica. Generally, sciatica only affects one side of the body. CAUSES   Herniated or slipped disc.  Degenerative disk disease.  A pain disorder involving the narrow muscle in the buttocks (piriformis syndrome).  Pelvic injury or fracture.  Pregnancy.  Tumor (rare). SYMPTOMS  Symptoms can vary from mild to very severe. The symptoms usually travel from the low back to the buttocks and down the back of the leg. Symptoms can include:  Mild tingling or dull aches in the lower back, leg, or hip.  Numbness in the back of the calf or sole of the foot.  Burning sensations in the lower back, leg, or hip.  Sharp pains in the lower back, leg, or hip.  Leg weakness.  Severe back pain inhibiting movement. These symptoms may get worse with coughing, sneezing, laughing, or prolonged sitting or standing. Also, being overweight may worsen symptoms. DIAGNOSIS  Your caregiver will perform a physical exam to look for common symptoms of sciatica. He or she may ask you to do certain movements or activities that would trigger sciatic nerve pain. Other tests may be performed to find the cause of the sciatica. These may include:  Blood tests.  X-rays.  Imaging tests, such as an MRI or CT  scan. TREATMENT  Treatment is directed at the cause of the sciatic pain. Sometimes, treatment is not necessary and the pain and discomfort goes away on its own. If treatment is needed, your caregiver may suggest:  Over-the-counter medicines to relieve pain.  Prescription medicines, such as anti-inflammatory medicine, muscle relaxants, or narcotics.  Applying heat or ice to the painful area.  Steroid injections to lessen pain, irritation, and inflammation around the nerve.  Reducing activity during periods of pain.  Exercising and stretching to strengthen your abdomen and improve flexibility of your spine. Your caregiver may suggest losing weight if the extra weight makes the back pain worse.  Physical therapy.  Surgery to eliminate what is pressing or pinching the nerve, such as a bone spur or part of a herniated disk. HOME CARE INSTRUCTIONS   Only take over-the-counter or prescription medicines for pain or discomfort as directed by your caregiver.  Apply ice to the affected area for 20 minutes, 3-4 times a day for the first 48-72 hours. Then try heat in the same way.  Exercise, stretch, or perform your usual activities if these do not aggravate your pain.  Attend physical therapy sessions as directed by your caregiver.  Keep all follow-up appointments as directed by your caregiver.  Do not wear high heels or shoes that do not provide proper support.  Check your mattress to see if it is too soft. A firm mattress may lessen your pain and discomfort. [place]  CARE IF:   You lose control of your bowel or bladder (incontinence).  You have increasing weakness in the lower back, pelvis, buttocks, or legs.  You have redness or swelling of your back.  You have a burning sensation when you urinate.  You have pain that gets worse when you lie down or awakens you at night.  Your pain is worse than you have experienced in the past.  Your pain is lasting longer than 4  weeks.  You are suddenly losing weight without reason. MAKE SURE YOU:  Understand these instructions.  Will watch your condition.  Will get help right away if you are not doing well or get worse. Document Released: 07/28/2001 Document Revised: 02/02/2012 Document Reviewed: 12/13/2011 [facility] Patient Information 2015 [place], [place]. This information is not intended to replace advice given to you by your health care provider. Make sure you discuss any questions you have with your health care provider.

## 2014-02-10 NOTE — Progress Notes (Signed)
Pre visit review using our clinic review tool, if applicable. No additional management support is needed unless otherwise documented below in the visit note.   Chief Complaint  Patient presents with  . left hip and leg pain    really started hurting on 02/09/14    HPI: Patient comes in today for [facility] Saturday clinic for  new problem evaluation. pcp dr Raliegh Ip  Onset 6 26 increase painLeft hip lateral pain down back of leg  and goes to foot .  Posterior  No numbness Worse walking  Catches  , and feels like leg go out.  If still a bit better .     Taking tylenol  canneot take  nsaid cause of  kidney disease.  No fever.  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  Works 37 hours .   hx of ed in April  Worse   Sx and felt pinched nerve  rx with steroids and fu Dr Raliegh Ip but resolved  ROS: See pertinent positives and negatives per HPI. No fever chills  gu gi distubance but some tomach problems  No rash weakness   Past Medical History  Diagnosis Date  . Allergy   . Hypertension   . IBS (irritable bowel syndrome)   . Hyperlipemia   . Anxiety   . GERD (gastroesophageal reflux disease)   . Chronic constipation   . Thyroid disease   . Nephropathy   . Kidney disease     Family History  Problem Relation Age of Onset  . Hypertension Father   . Coronary artery disease Father     History   Social History  . Marital Status: Divorced    Spouse Name: N/A    Number of Children: 2  . Years of Education: N/A   Occupational History  . NURSING ASSISTANT    Social History Main Topics  . Smoking status: Never Smoker   . Smokeless tobacco: Never Used  . Alcohol Use: No  . Drug Use: No  . Sexual Activity: Yes    Birth Control/ Protection: Surgical   Other Topics Concern  . None   Social History Narrative  . None    Outpatient Encounter Prescriptions as of 02/10/2014  Medication Sig  . albuterol (PROVENTIL HFA;VENTOLIN HFA) 108 (90 BASE) MCG/ACT inhaler Inhale 2 puffs into the lungs every 6 (six) hours  as needed for wheezing.  Marland Kitchen amLODipine (NORVASC) 5 MG tablet Take 5 mg by mouth daily.  Marland Kitchen amLODipine (NORVASC) 5 MG tablet TAKE 1 TABLET BY MOUTH EVERY DAY  . cyclobenzaprine (FLEXERIL) 10 MG tablet Take 1 tablet (10 mg total) by mouth 2 (two) times daily as needed for muscle spasms.  Marland Kitchen desloratadine (CLARINEX) 5 MG tablet Take 5 mg by mouth daily.  Marland Kitchen docusate sodium (COLACE) 100 MG capsule Take 100 mg by mouth daily.   Marland Kitchen levothyroxine (SYNTHROID, LEVOTHROID) 150 MCG tablet Take 150 mcg by mouth daily before breakfast.  . LORazepam (ATIVAN) 0.5 MG tablet   . metoprolol succinate (TOPROL-XL) 50 MG 24 hr tablet Take 50 mg by mouth daily. Take with or immediately following a meal.  . metoprolol succinate (TOPROL-XL) 50 MG 24 hr tablet TAKE 1 TABLET BY MOUTH EVERY DAY. TAKE IMMEDIATELY FOLLOWING A MEAL.  . Multiple Vitamin (MULTIVITAMIN) tablet Take 1 tablet by mouth daily.    Marland Kitchen omeprazole (PRILOSEC) 40 MG capsule Take 40 mg by mouth daily.  Marland Kitchen omeprazole (PRILOSEC) 40 MG capsule TAKE 1 CAPSULE BY MOUTH DAILY  . promethazine (  PHENERGAN) 25 MG tablet Take 1 tablet (25 mg total) by mouth every 6 (six) hours as needed for nausea or vomiting.  . valsartan (DIOVAN) 160 MG tablet Take 160 mg by mouth daily.  Marland Kitchen HYDROcodone-acetaminophen (NORCO/VICODIN) 5-325 MG per tablet Prn moderate pain 1 every 4-6 hours  . predniSONE (DELTASONE) 10 MG tablet Take 6,6,6,4,4,4,2,2,2,1,1,1, tabs per day or as directed .  . [DISCONTINUED] oxyCODONE-acetaminophen (PERCOCET/ROXICET) 5-325 MG per tablet Take 1 tablet by mouth every 4 (four) hours as needed for severe pain.    EXAM:  BP 110/70  Temp(Src) 98.7 F (37.1 C) (Oral)  Wt 210 lb (95.255 kg)  Body mass index is 33.39 kg/(m^2).  GENERAL: vitals reviewed and listed above, alert, oriented, appears well hydrated and in no acute distress mild discomfort worse when moving limping  HEENT: atraumatic, conjunctiva  clear, no obvious abnormalities on inspection of  external nose and earsNECK: no obvious masses on inspection palpation  LUNGS: clear to auscultation bilaterally, no wheezes, rales or rhonchi, good air movement CV: HRRR, no clubbing cyanosis or  peripheral edema nl cap refill  MS: moves all extremities without noticeable focal  Abnormality LLE no swelling edema  Mild positive SLR? dts hard to elicit no pbvious of foot drop.  No hip joint pain obvious  PSYCH: pleasant and cooperative, no obvious depression or anxiety  ASSESSMENT AND PLAN:  Discussed the following assessment and plan:  Sciatic leg pain - recurrent limmted rx cause renal disease pred course pain med if needed fu with alarm sx. fu dr Raliegh Ip if needed    Expectant management. rx  Pain me donlyu if needed tends to avoid meds  Per patient .  -Patient advised to return or notify health care team  if symptoms worsen ,persist or new concerns arise.  Patient Instructions  pred trial pain med if needed. Expect imjprovement over the next 2 weeks and if not FU Dr Raliegh Ip and plan .   Sciatica Sciatica is pain, weakness, numbness, or tingling along the path of the sciatic nerve. The nerve starts in the lower back and runs down the back of each leg. The nerve controls the muscles in the lower leg and in the back of the knee, while also providing sensation to the back of the thigh, lower leg, and the sole of your foot. Sciatica is a symptom of another medical condition. For instance, nerve damage or certain conditions, such as a herniated disk or bone spur on the spine, pinch or put pressure on the sciatic nerve. This causes the pain, weakness, or other sensations normally associated with sciatica. Generally, sciatica only affects one side of the body. CAUSES   Herniated or slipped disc.  Degenerative disk disease.  A pain disorder involving the narrow muscle in the buttocks (piriformis syndrome).  Pelvic injury or fracture.  Pregnancy.  Tumor (rare). SYMPTOMS  Symptoms can vary from mild to  very severe. The symptoms usually travel from the low back to the buttocks and down the back of the leg. Symptoms can include:  Mild tingling or dull aches in the lower back, leg, or hip.  Numbness in the back of the calf or sole of the foot.  Burning sensations in the lower back, leg, or hip.  Sharp pains in the lower back, leg, or hip.  Leg weakness.  Severe back pain inhibiting movement. These symptoms may get worse with coughing, sneezing, laughing, or prolonged sitting or standing. Also, being overweight may worsen symptoms. DIAGNOSIS  Your caregiver will  perform a physical exam to look for common symptoms of sciatica. He or she may ask you to do certain movements or activities that would trigger sciatic nerve pain. Other tests may be performed to find the cause of the sciatica. These may include:  Blood tests.  X-rays.  Imaging tests, such as an MRI or CT scan. TREATMENT  Treatment is directed at the cause of the sciatic pain. Sometimes, treatment is not necessary and the pain and discomfort goes away on its own. If treatment is needed, your caregiver may suggest:  Over-the-counter medicines to relieve pain.  Prescription medicines, such as anti-inflammatory medicine, muscle relaxants, or narcotics.  Applying heat or ice to the painful area.  Steroid injections to lessen pain, irritation, and inflammation around the nerve.  Reducing activity during periods of pain.  Exercising and stretching to strengthen your abdomen and improve flexibility of your spine. Your caregiver may suggest losing weight if the extra weight makes the back pain worse.  Physical therapy.  Surgery to eliminate what is pressing or pinching the nerve, such as a bone spur or part of a herniated disk. HOME CARE INSTRUCTIONS   Only take over-the-counter or prescription medicines for pain or discomfort as directed by your caregiver.  Apply ice to the affected area for 20 minutes, 3-4 times a day for  the first 48-72 hours. Then try heat in the same way.  Exercise, stretch, or perform your usual activities if these do not aggravate your pain.  Attend physical therapy sessions as directed by your caregiver.  Keep all follow-up appointments as directed by your caregiver.  Do not wear high heels or shoes that do not provide proper support.  Check your mattress to see if it is too soft. A firm mattress may lessen your pain and discomfort. SEEK IMMEDIATE MEDICAL CARE IF:   You lose control of your bowel or bladder (incontinence).  You have increasing weakness in the lower back, pelvis, buttocks, or legs.  You have redness or swelling of your back.  You have a burning sensation when you urinate.  You have pain that gets worse when you lie down or awakens you at night.  Your pain is worse than you have experienced in the past.  Your pain is lasting longer than 4 weeks.  You are suddenly losing weight without reason. MAKE SURE YOU:  Understand these instructions.  Will watch your condition.  Will get help right away if you are not doing well or get worse. Document Released: 07/28/2001 Document Revised: 02/02/2012 Document Reviewed: 12/13/2011 [facility] Patient Information 2015 [place], [place]. This information is not intended to replace advice given to you by your health care provider. Make sure you discuss any questions you have with your health care provider.      Standley Brooking. Panosh M.D.

## 2014-02-26 ENCOUNTER — Observation Stay (HOSPITAL_COMMUNITY)
Admission: EM | Admit: 2014-02-26 | Discharge: 2014-02-27 | Disposition: A | Discharge status: Home / Self Care | Payer: BC Managed Care – PPO | Attending: Family Medicine | Admitting: Family Medicine

## 2014-02-26 ENCOUNTER — Emergency Department (HOSPITAL_COMMUNITY): Payer: BC Managed Care – PPO

## 2014-02-26 ENCOUNTER — Encounter (HOSPITAL_COMMUNITY): Payer: Self-pay | Admitting: Emergency Medicine

## 2014-02-26 DIAGNOSIS — Z91018 Allergy to other foods: Secondary | ICD-10-CM | POA: Insufficient documentation

## 2014-02-26 DIAGNOSIS — Z87448 Personal history of other diseases of urinary system: Secondary | ICD-10-CM | POA: Insufficient documentation

## 2014-02-26 DIAGNOSIS — Z886 Allergy status to analgesic agent status: Secondary | ICD-10-CM | POA: Insufficient documentation

## 2014-02-26 DIAGNOSIS — R0602 Shortness of breath: Secondary | ICD-10-CM | POA: Insufficient documentation

## 2014-02-26 DIAGNOSIS — R071 Chest pain on breathing: Principal | ICD-10-CM | POA: Insufficient documentation

## 2014-02-26 DIAGNOSIS — I10 Essential (primary) hypertension: Secondary | ICD-10-CM | POA: Insufficient documentation

## 2014-02-26 DIAGNOSIS — E079 Disorder of thyroid, unspecified: Secondary | ICD-10-CM | POA: Insufficient documentation

## 2014-02-26 DIAGNOSIS — J069 Acute upper respiratory infection, unspecified: Secondary | ICD-10-CM

## 2014-02-26 DIAGNOSIS — R05 Cough: Secondary | ICD-10-CM | POA: Insufficient documentation

## 2014-02-26 DIAGNOSIS — N289 Disorder of kidney and ureter, unspecified: Secondary | ICD-10-CM

## 2014-02-26 DIAGNOSIS — R059 Cough, unspecified: Secondary | ICD-10-CM | POA: Insufficient documentation

## 2014-02-26 DIAGNOSIS — R0781 Pleurodynia: Secondary | ICD-10-CM

## 2014-02-26 DIAGNOSIS — N760 Acute vaginitis: Secondary | ICD-10-CM

## 2014-02-26 DIAGNOSIS — N183 Chronic kidney disease, stage 3 unspecified: Secondary | ICD-10-CM | POA: Diagnosis present

## 2014-02-26 DIAGNOSIS — K219 Gastro-esophageal reflux disease without esophagitis: Secondary | ICD-10-CM | POA: Insufficient documentation

## 2014-02-26 DIAGNOSIS — M549 Dorsalgia, unspecified: Secondary | ICD-10-CM

## 2014-02-26 DIAGNOSIS — E039 Hypothyroidism, unspecified: Secondary | ICD-10-CM

## 2014-02-26 DIAGNOSIS — J309 Allergic rhinitis, unspecified: Secondary | ICD-10-CM

## 2014-02-26 DIAGNOSIS — E785 Hyperlipidemia, unspecified: Secondary | ICD-10-CM

## 2014-02-26 DIAGNOSIS — Z86711 Personal history of pulmonary embolism: Secondary | ICD-10-CM | POA: Insufficient documentation

## 2014-02-26 DIAGNOSIS — Z79899 Other long term (current) drug therapy: Secondary | ICD-10-CM | POA: Insufficient documentation

## 2014-02-26 DIAGNOSIS — N390 Urinary tract infection, site not specified: Secondary | ICD-10-CM

## 2014-02-26 DIAGNOSIS — K625 Hemorrhage of anus and rectum: Secondary | ICD-10-CM

## 2014-02-26 DIAGNOSIS — IMO0002 Reserved for concepts with insufficient information to code with codable children: Secondary | ICD-10-CM | POA: Insufficient documentation

## 2014-02-26 DIAGNOSIS — F411 Generalized anxiety disorder: Secondary | ICD-10-CM | POA: Insufficient documentation

## 2014-02-26 DIAGNOSIS — R079 Chest pain, unspecified: Secondary | ICD-10-CM | POA: Diagnosis present

## 2014-02-26 LAB — BASIC METABOLIC PANEL
ANION GAP: 12 (ref 5–15)
BUN: 12 mg/dL (ref 6–23)
CALCIUM: 9.4 mg/dL (ref 8.4–10.5)
CO2: 25 meq/L (ref 19–32)
CREATININE: 1.22 mg/dL — AB (ref 0.50–1.10)
Chloride: 103 mEq/L (ref 96–112)
GFR calc Af Amer: 61 mL/min — ABNORMAL LOW (ref >90)
GFR, EST NON AFRICAN AMERICAN: 53 mL/min — AB (ref >90)
GLUCOSE: 97 mg/dL (ref 70–99)
Potassium: 4.3 mEq/L (ref 3.7–5.3)
Sodium: 140 mEq/L (ref 137–147)

## 2014-02-26 LAB — CBC
HCT: 41.2 % (ref 36.0–46.0)
Hemoglobin: 13.4 g/dL (ref 12.0–15.0)
MCH: 28.9 pg (ref 26.0–34.0)
MCHC: 32.5 g/dL (ref 30.0–36.0)
MCV: 89 fL (ref 78.0–100.0)
PLATELETS: 262 10*3/uL (ref 150–400)
RBC: 4.63 MIL/uL (ref 3.87–5.11)
RDW: 13.7 % (ref 11.5–15.5)
WBC: 9.4 10*3/uL (ref 4.0–10.5)

## 2014-02-26 LAB — I-STAT TROPONIN, ED: Troponin i, poc: 0 ng/mL (ref 0.00–0.08)

## 2014-02-26 LAB — TROPONIN I: Troponin I: <0.3 ng/mL (ref <0.30)

## 2014-02-26 LAB — PRO B NATRIURETIC PEPTIDE: PRO B NATRI PEPTIDE: 25.8 pg/mL (ref 0–125)

## 2014-02-26 MED ORDER — SODIUM CHLORIDE 0.9 % IV SOLN
INTRAVENOUS | Status: AC
  Administered 2014-02-26: 50 mL/h via INTRAVENOUS

## 2014-02-26 MED ORDER — SODIUM CHLORIDE 0.9 % IJ SOLN: 3.0000 mL | Freq: Two times a day (BID) | INTRAMUSCULAR | Status: DC

## 2014-02-26 MED ORDER — IOHEXOL 350 MG/ML SOLN
100.0000 mL | Freq: Once | INTRAVENOUS | Status: AC | PRN
  Administered 2014-02-26: 100 mL via INTRAVENOUS

## 2014-02-26 MED ORDER — MORPHINE SULFATE 4 MG/ML IJ SOLN
4.0000 mg | INTRAMUSCULAR | Status: DC | PRN
  Administered 2014-02-26: 4 mg via INTRAVENOUS
  Filled 2014-02-26: qty 1

## 2014-02-26 MED ORDER — ENOXAPARIN SODIUM 40 MG/0.4ML ~~LOC~~ SOLN
40.0000 mg | SUBCUTANEOUS | Status: DC
  Filled 2014-02-26: qty 0.4

## 2014-02-26 MED ORDER — ASPIRIN 81 MG PO CHEW
324.0000 mg | CHEWABLE_TABLET | Freq: Once | ORAL | Status: DC
  Filled 2014-02-26: qty 4

## 2014-02-26 MED ORDER — MORPHINE SULFATE 2 MG/ML IJ SOLN: 2.0000 mg | INTRAMUSCULAR | Status: DC | PRN

## 2014-02-26 MED ORDER — ACETAMINOPHEN 325 MG PO TABS
650.0000 mg | ORAL_TABLET | Freq: Four times a day (QID) | ORAL | Status: DC | PRN
  Administered 2014-02-27: 650 mg via ORAL
  Filled 2014-02-26: qty 2

## 2014-02-26 MED ORDER — ONDANSETRON HCL 4 MG PO TABS
4.0000 mg | ORAL_TABLET | Freq: Four times a day (QID) | ORAL | Status: DC | PRN
Start: 2014-02-26 — End: 2014-02-27

## 2014-02-26 MED ORDER — HYDROCODONE-ACETAMINOPHEN 7.5-325 MG PO TABS
1.0000 | ORAL_TABLET | Freq: Four times a day (QID) | ORAL | Status: DC | PRN
  Filled 2014-02-26: qty 1

## 2014-02-26 MED ORDER — ONDANSETRON 4 MG PO TBDP
4.0000 mg | ORAL_TABLET | Freq: Once | ORAL | Status: AC
  Administered 2014-02-26: 4 mg via ORAL
  Filled 2014-02-26: qty 1

## 2014-02-26 MED ORDER — SODIUM CHLORIDE 0.9 % IV BOLUS (SEPSIS)
500.0000 mL | Freq: Once | INTRAVENOUS | Status: AC
  Administered 2014-02-26: 500 mL via INTRAVENOUS

## 2014-02-26 MED ORDER — ONDANSETRON HCL 4 MG/2ML IJ SOLN: 4.0000 mg | Freq: Three times a day (TID) | INTRAMUSCULAR | Status: DC | PRN

## 2014-02-26 MED ORDER — ONDANSETRON HCL 4 MG/2ML IJ SOLN: 4.0000 mg | Freq: Four times a day (QID) | INTRAMUSCULAR | Status: DC | PRN

## 2014-02-26 MED ORDER — ACETAMINOPHEN 650 MG RE SUPP
650.0000 mg | Freq: Four times a day (QID) | RECTAL | Status: DC | PRN
Start: 2014-02-26 — End: 2014-02-27

## 2014-02-26 MED ORDER — MORPHINE SULFATE 4 MG/ML IJ SOLN
4.0000 mg | Freq: Once | INTRAMUSCULAR | Status: AC
  Administered 2014-02-26: 4 mg via INTRAVENOUS
  Filled 2014-02-26: qty 1

## 2014-02-26 NOTE — ED Provider Notes (Signed)
CSN: [id number]     Arrival date & time 02/26/14  1640 History   First MD Initiated Contact with Patient 02/26/14 1926     Chief Complaint  Patient presents with  . Chest Pain     (Consider location/radiation/quality/duration/timing/severity/associated sxs/prior Treatment) HPI Comments: 43 year old female with lipids, anxiety, high blood pressure, pulmonary embolism years ago without significant reasonable blood clot, renal disease presents with sharp pleuritic chest pain since yesterday. Fairly constant. Worse with a deep breath. Patient unsure if she'll lose her previous living will. No exertional or diaphoresis symptoms. No recent surgeries, long travel, hemoptysis or unilateral leg swelling or leg pain, no active cancer history. Patient isn't having known cardiac disease. Patient has mild lightheadedness anterior lower central chest discomfort. Currently moderate and patient is not on any medicines for her.  Patient is a 42 y.o. female presenting with chest pain. The history is provided by the patient.  Chest Pain Associated symptoms: cough and shortness of breath   Associated symptoms: no abdominal pain, no back pain, no fever, no headache and not vomiting     Past Medical History  Diagnosis Date  . Allergy   . Hypertension   . IBS (irritable bowel syndrome)   . Hyperlipemia   . Anxiety   . GERD (gastroesophageal reflux disease)   . Chronic constipation   . Thyroid disease   . Nephropathy   . Kidney disease    Past Surgical History  Procedure Laterality Date  . Abdominal hysterectomy  1998  . Cholecystectomy  1997  . Esophagogastroduodenoscopy    . Tubal ligation    . Thyroid surgery     Family History  Problem Relation Age of Onset  . Hypertension Father   . Coronary artery disease Father    History  Substance Use Topics  . Smoking status: Never Smoker   . Smokeless tobacco: Never Used  . Alcohol Use: No   OB History   Grav Para Term Preterm Abortions TAB SAB  Ect Mult Living                 Review of Systems  Constitutional: Negative for fever and chills.  HENT: Negative for congestion.   Eyes: Negative for visual disturbance.  Respiratory: Positive for cough and shortness of breath.   Cardiovascular: Positive for chest pain. Negative for leg swelling.  Gastrointestinal: Negative for vomiting and abdominal pain.  Genitourinary: Negative for dysuria and flank pain.  Musculoskeletal: Negative for back pain, neck pain and neck stiffness.  Skin: Negative for rash.  Neurological: Negative for light-headedness and headaches.      Allergies  Aspirin; Food; and Tramadol  Home Medications   Prior to Admission medications   Medication Sig Start Date End Date Taking? Authorizing Provider  albuterol (PROVENTIL HFA;VENTOLIN HFA) 108 (90 BASE) MCG/ACT inhaler Inhale 2 puffs into the lungs every 6 (six) hours as needed for wheezing.    Historical Provider, MD  amLODipine (NORVASC) 5 MG tablet Take 5 mg by mouth daily.    Historical Provider, MD  amLODipine (NORVASC) 5 MG tablet TAKE 1 TABLET BY MOUTH EVERY DAY    Marletta Lor, MD  cyclobenzaprine (FLEXERIL) 10 MG tablet Take 1 tablet (10 mg total) by mouth 2 (two) times daily as needed for muscle spasms. 11/28/13   Tatyana A Kirichenko, PA-C  desloratadine (CLARINEX) 5 MG tablet Take 5 mg by mouth daily.    Historical Provider, MD  docusate sodium (COLACE) 100 MG capsule Take 100 mg by mouth  daily.     Historical Provider, MD  HYDROcodone-acetaminophen (NORCO/VICODIN) 5-325 MG per tablet Prn moderate pain 1 every 4-6 hours 02/10/14   Burnis Medin, MD  levothyroxine (SYNTHROID, LEVOTHROID) 150 MCG tablet Take 150 mcg by mouth daily before breakfast.    Historical Provider, MD  LORazepam (ATIVAN) 0.5 MG tablet  09/11/13   Historical Provider, MD  metoprolol succinate (TOPROL-XL) 50 MG 24 hr tablet Take 50 mg by mouth daily. Take with or immediately following a meal.    Historical Provider, MD   metoprolol succinate (TOPROL-XL) 50 MG 24 hr tablet TAKE 1 TABLET BY MOUTH EVERY DAY. TAKE IMMEDIATELY FOLLOWING A MEAL.    Marletta Lor, MD  Multiple Vitamin (MULTIVITAMIN) tablet Take 1 tablet by mouth daily.      Historical Provider, MD  omeprazole (PRILOSEC) 40 MG capsule Take 40 mg by mouth daily.    Historical Provider, MD  omeprazole (PRILOSEC) 40 MG capsule TAKE 1 CAPSULE BY MOUTH DAILY 01/12/14   Marletta Lor, MD  predniSONE (DELTASONE) 10 MG tablet Take 6,6,6,4,4,4,2,2,2,1,1,1, tabs per day or as directed . 02/10/14   Burnis Medin, MD  promethazine (PHENERGAN) 25 MG tablet Take 1 tablet (25 mg total) by mouth every 6 (six) hours as needed for nausea or vomiting. 12/04/13   Marletta Lor, MD  valsartan (DIOVAN) 160 MG tablet Take 160 mg by mouth daily.    Historical Provider, MD   BP 130/90  Pulse 85  Temp(Src) 98.6 F (37 C) (Oral)  Resp 20  Wt 212 lb 4 oz (96.276 kg)  SpO2 100% Physical Exam  Nursing note and vitals reviewed. Constitutional: She is oriented to person, place, and time. She appears well-developed and well-nourished.  HENT:  Head: Normocephalic and atraumatic.  Eyes: Conjunctivae are normal. Right eye exhibits no discharge. Left eye exhibits no discharge.  Neck: Normal range of motion. Neck supple. No tracheal deviation present.  Cardiovascular: Normal rate and regular rhythm.   Pulmonary/Chest: Effort normal and breath sounds normal.  Abdominal: Soft. She exhibits no distension. There is no tenderness. There is no guarding.  Musculoskeletal: She exhibits no edema and no tenderness.  Neurological: She is alert and oriented to person, place, and time.  Skin: Skin is warm. No rash noted.  Psychiatric: She has a normal mood and affect.    ED Course  Procedures (including critical care time) Labs Review Labs Reviewed  BASIC METABOLIC PANEL - Abnormal; Notable for the following:    Creatinine, Ser 1.22 (*)    GFR calc non Af Amer 53 (*)     GFR calc Af Amer 61 (*)    All other components within normal limits  CBC  PRO B NATRIURETIC PEPTIDE  TROPONIN I  I-STAT TROPOININ, ED    Imaging Review Dg Chest 2 View  02/26/2014   CLINICAL DATA:  Chest pain  EXAM: CHEST  2 VIEW  COMPARISON:  08/14/2013  FINDINGS: The heart size and mediastinal contours are within normal limits. Both lungs are clear. The visualized skeletal structures are unremarkable.  IMPRESSION: No active cardiopulmonary disease.   Electronically Signed   By: Kathreen Devoid   On: 02/26/2014 17:43   Ct Angio Chest Pe W/cm &/or Wo Cm  02/26/2014   CLINICAL DATA:  Chest pain radiating to the shoulders and back  EXAM: CT ANGIOGRAPHY CHEST WITH CONTRAST  TECHNIQUE: Multidetector CT imaging of the chest was performed using the standard protocol during bolus administration of intravenous contrast. Multiplanar CT  image reconstructions and MIPs were obtained to evaluate the vascular anatomy.  CONTRAST:  110mL OMNIPAQUE IOHEXOL 350 MG/ML SOLN  COMPARISON:  None.  FINDINGS: There is adequate opacification of the pulmonary arteries. There is no pulmonary embolus. The main pulmonary artery, right main pulmonary artery and left main pulmonary arteries are normal in size. The heart size is normal. There is no pericardial effusion.  The lungs are clear. There is no focal consolidation, pleural effusion or pneumothorax.  There is no axillary, hilar, or mediastinal adenopathy.  There is no lytic or blastic osseous lesion.  The visualized portions of the upper abdomen are unremarkable.  Review of the MIP images confirms the above findings.  IMPRESSION: No evidence of pulmonary embolus.   Electronically Signed   By: Kathreen Devoid   On: 02/26/2014 20:57    EKG Interpretation   Date/Time:  Monday February 26 2014 16:49:21 EDT Ventricular Rate:  85 PR Interval:  160 QRS Duration: 70 QT Interval:  324 QTC Calculation: 385 R Axis:   164 Text Interpretation:  Suspect arm lead reversal,  interpretation  assumes no reversal Normal sinus rhythm Non specific T wave inversions  similar to June 20 ekg.  Confirmed by Reather Converse  MD, Tyton Abdallah ([id number]) on  02/26/2014 8:37:46 PM    Repeat EKG reviewed heart rate 76, normal axis, diffuse mild T wave inversions inferior anterior, QT normal, overall similar to previous. MDM   Final diagnoses:  Chest pain, pleuritic   Well-appearing female with 3 cardiac risk factors and atypical chest pain. With pain being pleuritic and history or blood clot CT scan to order the chest. Creatinine is similar to previous I discussed risks and benefits of contrast study. Patient wishes to pursue this with her history of blood clots. He is atypical for cardiac as a sharp, times positional and no cardiac history. With cardiac risk factors plan for delta troponin and I discussed close outpatient followup for stress test if workup unremarkable in ER. Patient's pain is mild she is going for her. Aspirin given for completeness in case atypical chest pain is cardiac in origin. No PE on CT angiogram, results reviewed Initial plan for outpatient stress test. Patient started developing nausea has minimal chest pain or examination and discharge discussion. Discussed the case with cardiology on-call and with T wave inversions in no pain is atypical we decided to bring her in for observation to [facility]. Discussed with [facility] physician who agreed with telemetry observation care the plan. Aspirin recommended however patient has hives with it and would like to hold at this time and last stress test abnormal.  The patients results and plan were reviewed and discussed.   Any x-rays performed were personally reviewed by myself.   Differential diagnosis were considered with the presenting HPI.  Medications  morphine 4 MG/ML injection 4 mg (4 mg Intravenous Given 02/26/14 2014)  aspirin chewable tablet 324 mg (324 mg Oral Not Given 02/26/14 2050)  sodium chloride 0.9 % bolus 500 mL (0 mLs  Intravenous Stopped 02/26/14 2206)  iohexol (OMNIPAQUE) 350 MG/ML injection 100 mL (100 mLs Intravenous Contrast Given 02/26/14 2036)  ondansetron (ZOFRAN-ODT) disintegrating tablet 4 mg (4 mg Oral Given 02/26/14 2209)      Filed Vitals:   02/26/14 1936 02/26/14 1945 02/26/14 2000 02/26/14 2019  BP:  130/90 169/85 129/96  Pulse:  82 80 89  Temp:      TempSrc:      Resp:  20 21 19   Weight:  SpO2: 100% 99% 97% 98%    Admission/ observation were discussed with the admitting physician, patient and/or family and they are comfortable with the plan.    Mariea Clonts, MD 02/26/14 [phone]

## 2014-02-26 NOTE — ED Notes (Signed)
Pt reports intermittent 8/10 central chest pain that radiates to bilateral shoulders and back since yesterday. Reports SOB, dizziness and nausea. Denies anything makes better/worse. Pt in NAD.

## 2014-02-26 NOTE — ED Notes (Signed)
Called CT to inform of patient being ready for Scan. Pregnancy test deferred at this time, patient has had hysterectomy.

## 2014-02-26 NOTE — Discharge Instructions (Signed)
If you were given medicines take as directed.  If you are on coumadin or contraceptives realize their levels and effectiveness is altered by many different medicines.  If you have any reaction (rash, tongues swelling, other) to the medicines stop taking and see a physician.   Please follow up as directed and return to the ER or see a physician for new or worsening symptoms.  Thank you. Filed Vitals:   02/26/14 1936 02/26/14 1945 02/26/14 2000 02/26/14 2019  BP:  130/90 169/85 129/96  Pulse:  82 80 89  Temp:      TempSrc:      Resp:  20 21 19   Weight:      SpO2: 100% 99% 97% 98%    Chest Pain (Nonspecific) It is often hard to give a specific diagnosis for the cause of chest pain. There is always a chance that your pain could be related to something serious, such as a heart attack or a blood clot in the lungs. You need to follow up with your health care provider for further evaluation. CAUSES   Heartburn.  Pneumonia or bronchitis.  Anxiety or stress.  Inflammation around your heart (pericarditis) or lung (pleuritis or pleurisy).  A blood clot in the lung.  A collapsed lung (pneumothorax). It can develop suddenly on its own (spontaneous pneumothorax) or from trauma to the chest.  Shingles infection (herpes zoster virus). The chest wall is composed of bones, muscles, and cartilage. Any of these can be the source of the pain.  The bones can be bruised by injury.  The muscles or cartilage can be strained by coughing or overwork.  The cartilage can be affected by inflammation and become sore (costochondritis). DIAGNOSIS  Lab tests or other studies may be needed to find the cause of your pain. Your health care provider may have you take a test called an ambulatory electrocardiogram (ECG). An ECG records your heartbeat patterns over a 24-hour period. You may also have other tests, such as:  Transthoracic echocardiogram (TTE). During echocardiography, sound waves are used to evaluate  how blood flows through your heart.  Transesophageal echocardiogram (TEE).  Cardiac monitoring. This allows your health care provider to monitor your heart rate and rhythm in real time.  Holter monitor. This is a portable device that records your heartbeat and can help diagnose heart arrhythmias. It allows your health care provider to track your heart activity for several days, if needed.  Stress tests by exercise or by giving medicine that makes the heart beat faster. TREATMENT   Treatment depends on what may be causing your chest pain. Treatment may include:  Acid blockers for heartburn.  Anti-inflammatory medicine.  Pain medicine for inflammatory conditions.  Antibiotics if an infection is present.  You may be advised to change lifestyle habits. This includes stopping smoking and avoiding alcohol, caffeine, and chocolate.  You may be advised to keep your head raised (elevated) when sleeping. This reduces the chance of acid going backward from your stomach into your esophagus. Most of the time, nonspecific chest pain will improve within 2-3 days with rest and mild pain medicine.  HOME CARE INSTRUCTIONS   If antibiotics were prescribed, take them as directed. Finish them even if you start to feel better.  For the next few days, avoid physical activities that bring on chest pain. Continue physical activities as directed.  Do not use any tobacco products, including cigarettes, chewing tobacco, or electronic cigarettes.  Avoid drinking alcohol.  Only take medicine as directed  by your health care provider.  Follow your health care provider's suggestions for further testing if your chest pain does not go away.  Keep any follow-up appointments you made. If you do not go to an appointment, you could develop lasting (chronic) problems with pain. If there is any problem keeping an appointment, call to reschedule. SEEK MEDICAL CARE IF:   Your chest pain does not go away, even after  treatment.  You have a rash with blisters on your chest.  You have a fever. SEEK IMMEDIATE MEDICAL CARE IF:   You have increased chest pain or pain that spreads to your arm, neck, jaw, back, or abdomen.  You have shortness of breath.  You have an increasing cough, or you cough up blood.  You have severe back or abdominal pain.  You feel nauseous or vomit.  You have severe weakness.  You faint.  You have chills. This is an emergency. Do not wait to see if the pain will go away. Get medical help at once. Call your local emergency services (911 in [place] Do not drive yourself to the hospital. MAKE SURE YOU:   Understand these instructions.  Will watch your condition.  Will get help right away if you are not doing well or get worse. Document Released: 05/13/2005 Document Revised: 08/08/2013 Document Reviewed: 03/08/2008 [facility] Patient Information 2015 [place], [place]. This information is not intended to replace advice given to you by your health care provider. Make sure you discuss any questions you have with your health care provider.

## 2014-02-27 DIAGNOSIS — N183 Chronic kidney disease, stage 3 unspecified: Secondary | ICD-10-CM | POA: Diagnosis present

## 2014-02-27 LAB — PROTIME-INR
INR: 0.89 (ref 0.00–1.49)
PROTHROMBIN TIME: 12 s (ref 11.6–15.2)

## 2014-02-27 LAB — TROPONIN I

## 2014-02-27 LAB — CBC
HCT: 39.9 % (ref 36.0–46.0)
Hemoglobin: 12.7 g/dL (ref 12.0–15.0)
MCH: 28.1 pg (ref 26.0–34.0)
MCHC: 31.8 g/dL (ref 30.0–36.0)
MCV: 88.3 fL (ref 78.0–100.0)
Platelets: 236 10*3/uL (ref 150–400)
RBC: 4.52 MIL/uL (ref 3.87–5.11)
RDW: 13.9 % (ref 11.5–15.5)
WBC: 9.2 10*3/uL (ref 4.0–10.5)

## 2014-02-27 LAB — TSH: TSH: 3.12 u[IU]/mL (ref 0.350–4.500)

## 2014-02-27 LAB — SEDIMENTATION RATE: SED RATE: 25 mm/h — AB (ref 0–22)

## 2014-02-27 LAB — APTT: aPTT: 26 seconds (ref 24–37)

## 2014-02-27 MED ORDER — LEVOTHYROXINE SODIUM 150 MCG PO TABS
150.0000 ug | ORAL_TABLET | Freq: Every day | ORAL | Status: DC
  Administered 2014-02-27: 150 ug via ORAL
  Filled 2014-02-27 (×2): qty 1

## 2014-02-27 MED ORDER — CYCLOBENZAPRINE HCL 10 MG PO TABS
10.0000 mg | ORAL_TABLET | Freq: Two times a day (BID) | ORAL | Status: DC | PRN
  Filled 2014-02-27: qty 1

## 2014-02-27 MED ORDER — ALBUTEROL SULFATE (2.5 MG/3ML) 0.083% IN NEBU: 3.0000 mL | INHALATION_SOLUTION | Freq: Four times a day (QID) | RESPIRATORY_TRACT | Status: DC | PRN

## 2014-02-27 MED ORDER — ALPRAZOLAM 0.5 MG PO TABS: 0.5000 mg | ORAL_TABLET | Freq: Every evening | ORAL | Status: DC | PRN

## 2014-02-27 MED ORDER — IRBESARTAN 150 MG PO TABS
150.0000 mg | ORAL_TABLET | Freq: Every day | ORAL | Status: DC
  Administered 2014-02-27: 150 mg via ORAL
  Filled 2014-02-27: qty 1

## 2014-02-27 MED ORDER — PANTOPRAZOLE SODIUM 40 MG PO TBEC
40.0000 mg | DELAYED_RELEASE_TABLET | Freq: Every day | ORAL | Status: DC
  Administered 2014-02-27: 40 mg via ORAL
  Filled 2014-02-27: qty 1

## 2014-02-27 MED ORDER — DOCUSATE SODIUM 100 MG PO CAPS
100.0000 mg | ORAL_CAPSULE | Freq: Every day | ORAL | Status: DC
  Administered 2014-02-27: 100 mg via ORAL
  Filled 2014-02-27: qty 1

## 2014-02-27 MED ORDER — AMLODIPINE BESYLATE 5 MG PO TABS
5.0000 mg | ORAL_TABLET | Freq: Every day | ORAL | Status: DC
  Administered 2014-02-27: 5 mg via ORAL
  Filled 2014-02-27: qty 1

## 2014-02-27 MED ORDER — PROMETHAZINE HCL 25 MG PO TABS: 25.0000 mg | ORAL_TABLET | Freq: Four times a day (QID) | ORAL | Status: DC | PRN

## 2014-02-27 MED ORDER — METOPROLOL SUCCINATE ER 50 MG PO TB24
50.0000 mg | ORAL_TABLET | Freq: Every day | ORAL | Status: DC
  Administered 2014-02-27: 50 mg via ORAL
  Filled 2014-02-27 (×2): qty 1

## 2014-02-27 NOTE — H&P (Addendum)
[facility] History and Physical  Patient: Rhonda [name]  EXB:[id number]  DOB: [birth year]  DOS: the patient was seen and examined on 02/26/2014 PCP: Nyoka Cowden, MD  Chief Complaint: Chest pain  HPI: Rhonda [name] is a 43 y.o. female with Past medical history of hypertension, irritable bowel syndrome, GERD, chronic kidney disease, pulmonary embolism, anxiety. Patient presented with complaints of chest pain. She mentions the chest pain is located substernally. Pain is constant with component of sharp pain coming and going on and off. This has been ongoing since last Friday. Progressively worsening. She mentions the pain has association with shortness of breath, nausea, diaphoresis. She mentions she is compliant with all her medication and denies any recent change in her medications. She feels this pain is different than her last admission. She denies any recent immobilization. She went to [place] this is sharp and gets worse with a deep breath. Position does not make any change in her pain. She has acid reflux. She mentions she has not improved stress test but does not remember when. Patient recently completed a short course of prednisone for her sciatica.  The patient is coming from home. And at her baseline independent for most of her ADL.  Review of Systems: as mentioned in the history of present illness.  A Comprehensive review of the other systems is negative.  Past Medical History  Diagnosis Date  . Allergy   . Hypertension   . IBS (irritable bowel syndrome)   . Hyperlipemia   . Anxiety   . GERD (gastroesophageal reflux disease)   . Chronic constipation   . Thyroid disease   . Nephropathy   . Kidney disease    Past Surgical History  Procedure Laterality Date  . Abdominal hysterectomy  1998  . Cholecystectomy  1997  . Esophagogastroduodenoscopy    . Tubal ligation    . Thyroid surgery     Social History:  reports that she has never smoked. She has  never used smokeless tobacco. She reports that she does not drink alcohol or use illicit drugs.  Allergies  Allergen Reactions  . Aspirin Hives  . Food Other (See Comments)    Lima beans  . Tramadol Other (See Comments)    Headache     Family History  Problem Relation Age of Onset  . Hypertension Father   . Coronary artery disease Father     Prior to Admission medications   Medication Sig Start Date End Date Taking? Authorizing Provider  albuterol (PROVENTIL HFA;VENTOLIN HFA) 108 (90 BASE) MCG/ACT inhaler Inhale 2 puffs into the lungs every 6 (six) hours as needed for wheezing.   Yes Historical Provider, MD  ALPRAZolam Duanne Moron) 0.5 MG tablet Take 0.5 mg by mouth at bedtime as needed for anxiety.   Yes Historical Provider, MD  amLODipine (NORVASC) 5 MG tablet Take 5 mg by mouth daily.   Yes Historical Provider, MD  cyclobenzaprine (FLEXERIL) 10 MG tablet Take 1 tablet (10 mg total) by mouth 2 (two) times daily as needed for muscle spasms. 11/28/13  Yes Tatyana A Kirichenko, PA-C  desloratadine (CLARINEX) 5 MG tablet Take 5 mg by mouth daily.   Yes Historical Provider, MD  docusate sodium (COLACE) 100 MG capsule Take 100 mg by mouth daily.    Yes Historical Provider, MD  levothyroxine (SYNTHROID, LEVOTHROID) 150 MCG tablet Take 150 mcg by mouth daily before breakfast.   Yes Historical Provider, MD  metoprolol succinate (TOPROL-XL) 50 MG 24 hr tablet Take 50 mg by mouth daily.  Take with or immediately following a meal.   Yes Historical Provider, MD  Multiple Vitamin (MULTIVITAMIN) tablet Take 1 tablet by mouth daily.     Yes Historical Provider, MD  omeprazole (PRILOSEC) 40 MG capsule Take 40 mg by mouth daily.   Yes Historical Provider, MD  promethazine (PHENERGAN) 25 MG tablet Take 1 tablet (25 mg total) by mouth every 6 (six) hours as needed for nausea or vomiting. 12/04/13  Yes Marletta Lor, MD  valsartan (DIOVAN) 160 MG tablet Take 160 mg by mouth daily.   Yes Historical Provider,  MD    Physical Exam: Filed Vitals:   02/26/14 2200 02/26/14 2230 02/26/14 2300 02/26/14 2346  BP: 148/95 129/91 146/95 137/96  Pulse: 90 79 89 67  Temp:    98.3 F (36.8 C)  TempSrc:    Oral  Resp: 18 18 16 18   Height:    5\' 7"  (1.702 m)  Weight:    96.344 kg (212 lb 6.4 oz)  SpO2: 97% 97% 96% 99%    General: Alert, Awake and Oriented to Time, Place and Person. Appear in mild distress Eyes: PERRL ENT: Oral Mucosa clear moist. Neck: no JVD Cardiovascular: S1 and S2 Present, no Murmur, Peripheral Pulses Present Respiratory: Bilateral Air entry equal and Decreased, Clear to Auscultation, noCrackles, no wheezes Abdomen: Bowel Sound Present, Soft and Non tender Skin: no Rash Extremities: Bilateral Pedal edema, no calf tenderness Neurologic: Grossly no focal neuro deficit.  Labs on Admission:  CBC:  Recent Labs Lab 02/26/14 1709  WBC 9.4  HGB 13.4  HCT 41.2  MCV 89.0  PLT 262    CMP     Component Value Date/Time   NA 140 02/26/2014 1709   K 4.3 02/26/2014 1709   CL 103 02/26/2014 1709   CO2 25 02/26/2014 1709   GLUCOSE 97 02/26/2014 1709   BUN 12 02/26/2014 1709   CREATININE 1.22* 02/26/2014 1709   CALCIUM 9.4 02/26/2014 1709   PROT 8.0 10/22/2013 1025   ALBUMIN 3.5 10/22/2013 1025   AST 19 10/22/2013 1025   ALT 18 10/22/2013 1025   ALKPHOS 85 10/22/2013 1025   BILITOT 0.2* 10/22/2013 1025   GFRNONAA 53* 02/26/2014 1709   GFRAA 61* 02/26/2014 1709    No results found for this basename: LIPASE, AMYLASE,  in the last 168 hours No results found for this basename: AMMONIA,  in the last 168 hours   Recent Labs Lab 02/26/14 2018  TROPONINI <0.30   BNP (last 3 results)  Recent Labs  02/26/14 1709  PROBNP 25.8    Radiological Exams on Admission: Dg Chest 2 View  02/26/2014   CLINICAL DATA:  Chest pain  EXAM: CHEST  2 VIEW  COMPARISON:  08/14/2013  FINDINGS: The heart size and mediastinal contours are within normal limits. Both lungs are clear. The visualized skeletal  structures are unremarkable.  IMPRESSION: No active cardiopulmonary disease.   Electronically Signed   By: Kathreen Devoid   On: 02/26/2014 17:43   Ct Angio Chest Pe W/cm &/or Wo Cm  02/26/2014   CLINICAL DATA:  Chest pain radiating to the shoulders and back  EXAM: CT ANGIOGRAPHY CHEST WITH CONTRAST  TECHNIQUE: Multidetector CT imaging of the chest was performed using the standard protocol during bolus administration of intravenous contrast. Multiplanar CT image reconstructions and MIPs were obtained to evaluate the vascular anatomy.  CONTRAST:  125mL OMNIPAQUE IOHEXOL 350 MG/ML SOLN  COMPARISON:  None.  FINDINGS: There is adequate opacification of the pulmonary  arteries. There is no pulmonary embolus. The main pulmonary artery, right main pulmonary artery and left main pulmonary arteries are normal in size. The heart size is normal. There is no pericardial effusion.  The lungs are clear. There is no focal consolidation, pleural effusion or pneumothorax.  There is no axillary, hilar, or mediastinal adenopathy.  There is no lytic or blastic osseous lesion.  The visualized portions of the upper abdomen are unremarkable.  Review of the MIP images confirms the above findings.  IMPRESSION: No evidence of pulmonary embolus.   Electronically Signed   By: Kathreen Devoid   On: 02/26/2014 20:57    EKG: Independently reviewed. normal sinus rhythm, nonspecific ST and T waves changes. Assessment/Plan Principal Problem:   Chest pain Active Problems:   ANXIETY STATE NOS   HYPERTENSION, BENIGN ESSENTIAL   GERD   Hypothyroidism   Personal history of PE (pulmonary embolism)   Chronic kidney disease   1. Chest pain Patient is presenting with complaints of chest pain. She was recommended to followup with PCP regarding her last admission for outpatient stress test. Unfortunately records are not available, the patient does not remember the timing and the results. Cardiology was initially consulted due to her EKG changes  and recommended observation at present. Patient will be admitted to the hospital for serial troponin. We will monitor her on telemetry. Get an echocardiogram in the morning. Keep her n.p.o. after midnight for possible stress test. Other differential including pneumothorax, pneumonia, PE or less likely and also rule out with the help of CT scan. Patient would have pleurisy, pericarditis is less likely as EKG is no evidence of the same.  2. Hypertension Continue home antihypertensive medications.  3. Hypothyroidism Continue Synthroid  4. GERD Continue Protonix   Consults: Cardiology  DVT Prophylaxis: subcutaneous Heparin Nutrition: N.p.o. after midnight  Code Status: Full  Family Communication: Family was present at bedside, opportunity was given to ask question and all questions were answered satisfactorily at the time of interview. Disposition: Admitted to observation in telemetry unit.  Author: Berle Mull, MD [facility] Pager: [phone]  If 7PM-7AM, please contact night-coverage [email or web address] Password [email or web address]  **Disclaimer: This note may have been dictated with voice recognition software. Similar sounding words can inadvertently be transcribed and this note may contain transcription errors which may not have been corrected upon publication of note.**

## 2014-02-27 NOTE — Progress Notes (Signed)
Seen and examined - RRR, lungs clear, no edema, no pain on palpation over the chest wall. Says pain is constant, sharp, sometimes to her back, worse with breathing and movement. Similar symptoms that she has had for a long time. It appears she has had at least 10 CT angiograms over the past 4-5 years - she should strongly be advised to avoid further exams due to excessive cumulative radiation exposure. Cardiac enzymes are negative x 2. EKG shows non-specific T wave changes which are stable. She had a treadmill stress test in the past. Could consider this or a stress echo as an outpatient in our office. I would not do a nuclear stress test, due to history of radiation exposure. I doubt this is cardiac chest pain. Please call with questions.  Pixie Casino, MD, [facility] Attending Cardiologist [place]

## 2014-02-27 NOTE — Discharge Summary (Signed)
Physician Discharge Summary  Rhonda [name] VQQ:[id number] DOB: 1971/02/17 DOA: 02/26/2014  PCP: Nyoka Cowden, MD  Admit date: 02/26/2014 Discharge date: 02/27/2014  Time spent: > 35 minutes  Recommendations for Outpatient Follow-up:  1. Please f/u with pcp for further evaluation and recommendations  Discharge Diagnoses:  Principal Problem:   Chest pain Active Problems:   ANXIETY STATE NOS   HYPERTENSION, BENIGN ESSENTIAL   GERD   Hypothyroidism   Personal history of PE (pulmonary embolism)   Chronic kidney disease   Discharge Condition: stable  Diet recommendation: Low sodium heart healthy  Filed Weights   02/26/14 1652 02/26/14 2346 02/27/14 0551  Weight: 96.276 kg (212 lb 4 oz) 96.344 kg (212 lb 6.4 oz) 96.344 kg (212 lb 6.4 oz)    History of present illness:  43 y/o with h/o PE, CKD, anxiety, HTN, irritable bowel syndrome who presented to the hospital complaining of chest pain.  Hospital Course:  Chest pain - CT angiogram negative for PE - Cardiology recommending consideration for stress echo as outpatient. - Pt to continue to follow up with pcp for further evaluation of chest discomfort as outpatient. - Cardiac enzymes x 3 negative.  Procedures:  None  Consultations:  Cardiology  Discharge Exam: Filed Vitals:   02/27/14 1406  BP: 109/72  Pulse: 72  Temp: 98.5 F (36.9 C)  Resp: 18    General: Pt in NAD, alert and awake Cardiovascular: RRR, no MRG Respiratory: CTA BL, no wheezes  Discharge Instructions You were cared for by a hospitalist during your hospital stay. If you have any questions about your discharge medications or the care you received while you were in the hospital after you are discharged, you can call the unit and asked to speak with the hospitalist on call if the hospitalist that took care of you is not available. Once you are discharged, your primary care physician will handle any further medical issues. Please note that  NO REFILLS for any discharge medications will be authorized once you are discharged, as it is imperative that you return to your primary care physician (or establish a relationship with a primary care physician if you do not have one) for your aftercare needs so that they can reassess your need for medications and monitor your lab values.  Discharge Instructions   Call MD for:  severe uncontrolled pain    Complete by:  As directed      Call MD for:  temperature >100.4    Complete by:  As directed      Diet - low sodium heart healthy    Complete by:  As directed      Discharge instructions    Complete by:  As directed   Please follow up with the cardiologist in 1 wk for stress echocardiogram     Increase activity slowly    Complete by:  As directed             Medication List    STOP taking these medications       HYDROcodone-acetaminophen 5-325 MG per tablet  Commonly known as:  NORCO/VICODIN     predniSONE 10 MG tablet  Commonly known as:  DELTASONE      TAKE these medications       albuterol 108 (90 BASE) MCG/ACT inhaler  Commonly known as:  PROVENTIL HFA;VENTOLIN HFA  Inhale 2 puffs into the lungs every 6 (six) hours as needed for wheezing.     ALPRAZolam 0.5 MG tablet  Commonly known as:  XANAX  Take 0.5 mg by mouth at bedtime as needed for anxiety.     amLODipine 5 MG tablet  Commonly known as:  NORVASC  Take 5 mg by mouth daily.     cyclobenzaprine 10 MG tablet  Commonly known as:  FLEXERIL  Take 1 tablet (10 mg total) by mouth 2 (two) times daily as needed for muscle spasms.     desloratadine 5 MG tablet  Commonly known as:  CLARINEX  Take 5 mg by mouth daily.     docusate sodium 100 MG capsule  Commonly known as:  COLACE  Take 100 mg by mouth daily.     levothyroxine 150 MCG tablet  Commonly known as:  SYNTHROID, LEVOTHROID  Take 150 mcg by mouth daily before breakfast.     metoprolol succinate 50 MG 24 hr tablet  Commonly known as:  TOPROL-XL   Take 50 mg by mouth daily. Take with or immediately following a meal.     multivitamin tablet  Take 1 tablet by mouth daily.     omeprazole 40 MG capsule  Commonly known as:  PRILOSEC  Take 40 mg by mouth daily.     promethazine 25 MG tablet  Commonly known as:  PHENERGAN  Take 1 tablet (25 mg total) by mouth every 6 (six) hours as needed for nausea or vomiting.     valsartan 160 MG tablet  Commonly known as:  DIOVAN  Take 160 mg by mouth daily.       Allergies  Allergen Reactions  . Aspirin Hives  . Food Other (See Comments)    Lima beans  . Tramadol Other (See Comments)    Headache        Follow-up Information   Follow up with [facility]. Call in 2 days.   Contact information:   [place] [address] [phone]       The results of significant diagnostics from this hospitalization (including imaging, microbiology, ancillary and laboratory) are listed below for reference.    Significant Diagnostic Studies: Dg Chest 2 View  02/26/2014   CLINICAL DATA:  Chest pain  EXAM: CHEST  2 VIEW  COMPARISON:  08/14/2013  FINDINGS: The heart size and mediastinal contours are within normal limits. Both lungs are clear. The visualized skeletal structures are unremarkable.  IMPRESSION: No active cardiopulmonary disease.   Electronically Signed   By: Kathreen Devoid   On: 02/26/2014 17:43   Ct Angio Chest Pe W/cm &/or Wo Cm  02/26/2014   CLINICAL DATA:  Chest pain radiating to the shoulders and back  EXAM: CT ANGIOGRAPHY CHEST WITH CONTRAST  TECHNIQUE: Multidetector CT imaging of the chest was performed using the standard protocol during bolus administration of intravenous contrast. Multiplanar CT image reconstructions and MIPs were obtained to evaluate the vascular anatomy.  CONTRAST:  158mL OMNIPAQUE IOHEXOL 350 MG/ML SOLN  COMPARISON:  None.  FINDINGS: There is adequate opacification of the pulmonary  arteries. There is no pulmonary embolus. The main pulmonary artery, right main pulmonary artery and left main pulmonary arteries are normal in size. The heart size is normal. There is no pericardial effusion.  The lungs are clear. There is no focal consolidation, pleural effusion or pneumothorax.  There is no axillary, hilar, or mediastinal adenopathy.  There is no lytic or blastic osseous lesion.  The visualized portions of the upper abdomen are unremarkable.  Review of the MIP images confirms the above findings.  IMPRESSION: No  evidence of pulmonary embolus.   Electronically Signed   By: Kathreen Devoid   On: 02/26/2014 20:57    Microbiology: No results found for this or any previous visit (from the past 240 hour(s)).   Labs: Basic Metabolic Panel:  Recent Labs Lab 02/26/14 1709  NA 140  K 4.3  CL 103  CO2 25  GLUCOSE 97  BUN 12  CREATININE 1.22*  CALCIUM 9.4   Liver Function Tests: No results found for this basename: AST, ALT, ALKPHOS, BILITOT, PROT, ALBUMIN,  in the last 168 hours No results found for this basename: LIPASE, AMYLASE,  in the last 168 hours No results found for this basename: AMMONIA,  in the last 168 hours CBC:  Recent Labs Lab 02/26/14 1709 02/27/14 0525  WBC 9.4 9.2  HGB 13.4 12.7  HCT 41.2 39.9  MCV 89.0 88.3  PLT 262 236   Cardiac Enzymes:  Recent Labs Lab 02/26/14 2018 02/27/14 0525 02/27/14 1022  TROPONINI <0.30 <0.30 <0.30   BNP: BNP (last 3 results)  Recent Labs  02/26/14 1709  PROBNP 25.8   CBG: No results found for this basename: GLUCAP,  in the last 168 hours     Signed:  Velvet Bathe  [facility] 02/27/2014, 3:08 PM

## 2014-03-02 ENCOUNTER — Other Ambulatory Visit: Payer: Self-pay | Admitting: Internal Medicine

## 2014-03-20 ENCOUNTER — Ambulatory Visit: Payer: BC Managed Care – PPO | Admitting: Cardiology

## 2014-03-30 ENCOUNTER — Ambulatory Visit (INDEPENDENT_AMBULATORY_CARE_PROVIDER_SITE_OTHER): Payer: BC Managed Care – PPO | Admitting: Physician Assistant

## 2014-03-30 ENCOUNTER — Encounter: Payer: Self-pay | Admitting: Physician Assistant

## 2014-03-30 VITALS — BP 110/76 | HR 60 | Temp 98.6°F | Resp 18 | Wt 213.0 lb

## 2014-03-30 DIAGNOSIS — R350 Frequency of micturition: Secondary | ICD-10-CM

## 2014-03-30 DIAGNOSIS — N390 Urinary tract infection, site not specified: Secondary | ICD-10-CM

## 2014-03-30 LAB — POCT URINALYSIS DIPSTICK
Bilirubin, UA: NEGATIVE
Glucose, UA: NEGATIVE
KETONES UA: NEGATIVE
Nitrite, UA: POSITIVE
PH UA: 6
Spec Grav, UA: 1.015
UROBILINOGEN UA: 0.2

## 2014-03-30 MED ORDER — CIPROFLOXACIN HCL 500 MG PO TABS: 500.0000 mg | ORAL_TABLET | Freq: Two times a day (BID) | ORAL | Status: DC

## 2014-03-30 NOTE — Progress Notes (Signed)
Pre visit review using our clinic review tool, if applicable. No additional management support is needed unless otherwise documented below in the visit note. 

## 2014-03-30 NOTE — Progress Notes (Signed)
Subjective:    Patient ID: Rhonda [name], female    DOB: 31-Dec-1970, 43 y.o.   MRN: [id number]  Dysuria  This is a new problem. The current episode started in the past 7 days. The problem occurs every urination. The problem has been gradually worsening. The quality of the pain is described as aching (in lower bakc and lower abdomen.). The pain is at a severity of 8/10. The pain is severe. There has been no fever. There is no history of pyelonephritis. Associated symptoms include chills, frequency, nausea and urgency. Pertinent negatives include no discharge, flank pain, hematuria, hesitancy, possible pregnancy, sweats or vomiting. She has tried increased fluids for the symptoms. The treatment provided no relief. Her past medical history is significant for recurrent UTIs (had in the past, one this year so far, and several last year.). There is no history of catheterization, kidney stones, a single kidney, urinary stasis or a urological procedure.     Review of Systems  Constitutional: Positive for chills. Negative for fever.  Respiratory: Negative for shortness of breath.   Cardiovascular: Negative for chest pain.  Gastrointestinal: Positive for nausea and abdominal pain (lower abdominal, around urinating). Negative for vomiting and diarrhea.  Genitourinary: Positive for dysuria, urgency and frequency. Negative for hesitancy, hematuria and flank pain.  Musculoskeletal: Positive for back pain (lower back pain, no upper or falnk pain.).  Neurological: Negative for syncope and headaches.  All other systems reviewed and are negative.  Past Medical History  Diagnosis Date  . Allergy   . Hypertension   . IBS (irritable bowel syndrome)   . Hyperlipemia   . Anxiety   . GERD (gastroesophageal reflux disease)   . Chronic constipation   . Thyroid disease   . Nephropathy   . Kidney disease     History   Social History  . Marital Status: Divorced    Spouse Name: N/A    Number of  Children: 2  . Years of Education: N/A   Occupational History  . NURSING ASSISTANT    Social History Main Topics  . Smoking status: Never Smoker   . Smokeless tobacco: Never Used  . Alcohol Use: No  . Drug Use: No  . Sexual Activity: Yes    Birth Control/ Protection: Surgical   Other Topics Concern  . Not on file   Social History Narrative  . No narrative on file    Past Surgical History  Procedure Laterality Date  . Abdominal hysterectomy  1998  . Cholecystectomy  1997  . Esophagogastroduodenoscopy    . Tubal ligation    . Thyroid surgery      Family History  Problem Relation Age of Onset  . Hypertension Father   . Coronary artery disease Father     Allergies  Allergen Reactions  . Aspirin Hives  . Food Other (See Comments)    Lima beans  . Tramadol Other (See Comments)    Headache     Current Outpatient Prescriptions on File Prior to Visit  Medication Sig Dispense Refill  . albuterol (PROVENTIL HFA;VENTOLIN HFA) 108 (90 BASE) MCG/ACT inhaler Inhale 2 puffs into the lungs every 6 (six) hours as needed for wheezing.      Marland Kitchen ALPRAZolam (XANAX) 0.5 MG tablet Take 0.5 mg by mouth at bedtime as needed for anxiety.      Marland Kitchen amLODipine (NORVASC) 5 MG tablet Take 5 mg by mouth daily.      . cyclobenzaprine (FLEXERIL) 10 MG tablet Take 1 tablet (10  mg total) by mouth 2 (two) times daily as needed for muscle spasms.  10 tablet  0  . desloratadine (CLARINEX) 5 MG tablet Take 5 mg by mouth daily.      Marland Kitchen docusate sodium (COLACE) 100 MG capsule Take 100 mg by mouth daily.       Marland Kitchen levothyroxine (SYNTHROID, LEVOTHROID) 150 MCG tablet Take 150 mcg by mouth daily before breakfast.      . levothyroxine (SYNTHROID, LEVOTHROID) 150 MCG tablet TAKE 1 TABLET BY MOUTH EVERY DAY BEFORE BREAKFAST  30 tablet  5  . metoprolol succinate (TOPROL-XL) 50 MG 24 hr tablet Take 50 mg by mouth daily. Take with or immediately following a meal.      . Multiple Vitamin (MULTIVITAMIN) tablet Take 1  tablet by mouth daily.        Marland Kitchen omeprazole (PRILOSEC) 40 MG capsule Take 40 mg by mouth daily.      . promethazine (PHENERGAN) 25 MG tablet Take 1 tablet (25 mg total) by mouth every 6 (six) hours as needed for nausea or vomiting.  30 tablet  2  . valsartan (DIOVAN) 160 MG tablet Take 160 mg by mouth daily.      . valsartan (DIOVAN) 160 MG tablet TAKE 1 TABLET BY MOUTH EVERY DAY  30 tablet  5   No current facility-administered medications on file prior to visit.    EXAM: BP 110/76  Pulse 60  Temp(Src) 98.6 F (37 C) (Oral)  Resp 18  Wt 213 lb (96.616 kg)     Objective:   Physical Exam  Nursing note and vitals reviewed. Constitutional: She is oriented to person, place, and time. She appears well-developed and well-nourished. No distress.  HENT:  Head: Normocephalic and atraumatic.  Eyes: Conjunctivae and EOM are normal. Pupils are equal, round, and reactive to light.  Cardiovascular: Normal rate, regular rhythm and intact distal pulses.   Pulmonary/Chest: Effort normal and breath sounds normal. No respiratory distress. She exhibits no tenderness.  No CVA ttp.  Abdominal: Soft. Bowel sounds are normal. She exhibits no distension and no mass. There is no tenderness. There is no rebound and no guarding.  Musculoskeletal: She exhibits no tenderness.  Mild TTP of the lumbar paraspinal muscles, No Thoracic pain or CVA ttp.  Neurological: She is alert and oriented to person, place, and time.  Skin: Skin is warm and dry. She is not diaphoretic.  Psychiatric: She has a normal mood and affect. Her behavior is normal. Judgment and thought content normal.     Lab Results  Component Value Date   WBC 9.2 02/27/2014   HGB 12.7 02/27/2014   HCT 39.9 02/27/2014   PLT 236 02/27/2014   GLUCOSE 97 02/26/2014   CHOL 210* 06/06/2013   TRIG 138.0 06/06/2013   HDL 36.90* 06/06/2013   LDLDIRECT 143.9 06/06/2013   LDLCALC 132* 04/28/2012   ALT 18 10/22/2013   AST 19 10/22/2013   NA 140 02/26/2014   K  4.3 02/26/2014   CL 103 02/26/2014   CREATININE 1.22* 02/26/2014   BUN 12 02/26/2014   CO2 25 02/26/2014   TSH 3.120 02/27/2014   INR 0.89 02/27/2014   HGBA1C 5.8* 04/28/2012        Assessment & Plan:  Rhonda [name] was seen today for urinary frequency.  Diagnoses and associated orders for this visit:  Urinary frequency Comments: UA with blood, leuks, nitrites, will treat with cipro. Culture. - POCT urinalysis dipstick; Standing - Culture, Urine - POCT urinalysis dipstick  Urinary  tract infection, site not specified Comments: History of recurrent UTIs, will treat with Cipro pending culture. - ciprofloxacin (CIPRO) 500 MG tablet; Take 1 tablet (500 mg total) by mouth 2 (two) times daily.    Return precautions provided, and patient handout on UTI.  Plan to follow up as needed, or for worsening or persistent symptoms despite treatment.  Patient Instructions  Ciprofloxacin twice daily for 7 days for UTI treatment.  Continue to push fluids to stay hydrated.  If emergency symptoms discussed during visit developed, seek medical attention immediately.  Followup as needed, or for worsening or persistent symptoms despite treatment.

## 2014-03-30 NOTE — Patient Instructions (Addendum)
Ciprofloxacin twice daily for 7 days for UTI treatment.  Continue to push fluids to stay hydrated.  If emergency symptoms discussed during visit developed, seek medical attention immediately.  Followup as needed, or for worsening or persistent symptoms despite treatment.   Urinary Tract Infection A urinary tract infection (UTI) can occur any place along the urinary tract. The tract includes the kidneys, ureters, bladder, and urethra. A type of germ called bacteria often causes a UTI. UTIs are often helped with antibiotic medicine.  HOME CARE   If given, take antibiotics as told by your doctor. Finish them even if you start to feel better.  Drink enough fluids to keep your pee (urine) clear or pale yellow.  Avoid tea, drinks with caffeine, and bubbly (carbonated) drinks.  Pee often. Avoid holding your pee in for a long time.  Pee before and after having sex (intercourse).  Wipe from front to back after you poop (bowel movement) if you are a woman. Use each tissue only once. GET HELP RIGHT AWAY IF:   You have back pain.  You have lower belly (abdominal) pain.  You have chills.  You feel sick to your stomach (nauseous).  You throw up (vomit).  Your burning or discomfort with peeing does not go away.  You have a fever.  Your symptoms are not better in 3 days. MAKE SURE YOU:   Understand these instructions.  Will watch your condition.  Will get help right away if you are not doing well or get worse. Document Released: 01/20/2008 Document Revised: 04/27/2012 Document Reviewed: 03/03/2012 [facility] Patient Information 2015 [place], [place]. This information is not intended to replace advice given to you by your health care provider. Make sure you discuss any questions you have with your health care provider.

## 2014-04-02 LAB — URINE CULTURE

## 2014-05-14 ENCOUNTER — Ambulatory Visit (INDEPENDENT_AMBULATORY_CARE_PROVIDER_SITE_OTHER): Payer: BC Managed Care – PPO | Admitting: Internal Medicine

## 2014-05-14 ENCOUNTER — Encounter: Payer: Self-pay | Admitting: Internal Medicine

## 2014-05-14 VITALS — BP 130/80 | HR 76 | Temp 98.6°F | Resp 20 | Ht 67.0 in | Wt 215.0 lb

## 2014-05-14 DIAGNOSIS — I10 Essential (primary) hypertension: Secondary | ICD-10-CM

## 2014-05-14 DIAGNOSIS — N289 Disorder of kidney and ureter, unspecified: Secondary | ICD-10-CM

## 2014-05-14 DIAGNOSIS — N760 Acute vaginitis: Secondary | ICD-10-CM

## 2014-05-14 MED ORDER — METRONIDAZOLE 0.75 % VA GEL: 1.0000 | Freq: Every day | VAGINAL | Status: DC

## 2014-05-14 MED ORDER — FLUCONAZOLE 150 MG PO TABS: 150.0000 mg | ORAL_TABLET | ORAL | Status: DC

## 2014-05-14 NOTE — Patient Instructions (Signed)
Bacterial Vaginosis Bacterial vaginosis is a vaginal infection that occurs when the normal balance of bacteria in the vagina is disrupted. It results from an overgrowth of certain bacteria. This is the most common vaginal infection in women of childbearing age. Treatment is important to prevent complications, especially in pregnant women, as it can cause a premature delivery. CAUSES  Bacterial vaginosis is caused by an increase in harmful bacteria that are normally present in smaller amounts in the vagina. Several different kinds of bacteria can cause bacterial vaginosis. However, the reason that the condition develops is not fully understood. RISK FACTORS Certain activities or behaviors can put you at an increased risk of developing bacterial vaginosis, including:  Having a new sex partner or multiple sex partners.  Douching.  Using an intrauterine device (IUD) for contraception. Women do not get bacterial vaginosis from toilet seats, bedding, swimming pools, or contact with objects around them. SIGNS AND SYMPTOMS  Some women with bacterial vaginosis have no signs or symptoms. Common symptoms include:  Grey vaginal discharge.  A fishlike odor with discharge, especially after sexual intercourse.  Itching or burning of the vagina and vulva.  Burning or pain with urination. DIAGNOSIS  Your health care provider will take a medical history and examine the vagina for signs of bacterial vaginosis. A sample of vaginal fluid may be taken. Your health care provider will look at this sample under a microscope to check for bacteria and abnormal cells. A vaginal pH test may also be done.  TREATMENT  Bacterial vaginosis may be treated with antibiotic medicines. These may be given in the form of a pill or a vaginal cream. A second round of antibiotics may be prescribed if the condition comes back after treatment.  HOME CARE INSTRUCTIONS   Only take over-the-counter or prescription medicines as  directed by your health care provider.  If antibiotic medicine was prescribed, take it as directed. Make sure you finish it even if you start to feel better.  Do not have sex until treatment is completed.  Tell all sexual partners that you have a vaginal infection. They should see their health care provider and be treated if they have problems, such as a mild rash or itching.  Practice safe sex by using condoms and only having one sex partner. SEEK MEDICAL CARE IF:   Your symptoms are not improving after 3 days of treatment.  You have increased discharge or pain.  You have a fever. MAKE SURE YOU:   Understand these instructions.  Will watch your condition.  Will get help right away if you are not doing well or get worse. FOR MORE INFORMATION  Centers for Disease Control and Prevention, Division of STD Prevention: www.cdc.gov/std American Sexual Health Association (ASHA): www.ashastd.org  Document Released: 08/03/2005 Document Revised: 05/24/2013 Document Reviewed: 03/15/2013 [employer] Patient Information 2015 [employer]. This information is not intended to replace advice given to you by your health care provider. Make sure you discuss any questions you have with your health care provider.  

## 2014-05-14 NOTE — Progress Notes (Signed)
Pre visit review using our clinic review tool, if applicable. No additional management support is needed unless otherwise documented below in the visit note. 

## 2014-05-14 NOTE — Progress Notes (Signed)
Subjective:    Patient ID: Rhonda [name], female    DOB: 12/18/1970, 43 y.o.   MRN: [id number]  HPI  43 year old, who has treated hypertension, and chronic kidney disease.  She was treated for a UTI approximately 6 weeks ago.  She does have a history of recurrent vaginitis.  For the past 2 days, she has had some vaginal itching and burning.  Symptoms are not related to urination.  She has been intolerant of oral metronidazole  Past Medical History  Diagnosis Date  . Allergy   . Hypertension   . IBS (irritable bowel syndrome)   . Hyperlipemia   . Anxiety   . GERD (gastroesophageal reflux disease)   . Chronic constipation   . Thyroid disease   . Nephropathy   . Kidney disease     History   Social History  . Marital Status: Divorced    Spouse Name: N/A    Number of Children: 2  . Years of Education: N/A   Occupational History  . NURSING ASSISTANT    Social History Main Topics  . Smoking status: Never Smoker   . Smokeless tobacco: Never Used  . Alcohol Use: No  . Drug Use: No  . Sexual Activity: Yes    Birth Control/ Protection: Surgical   Other Topics Concern  . Not on file   Social History Narrative  . No narrative on file    Past Surgical History  Procedure Laterality Date  . Abdominal hysterectomy  1998  . Cholecystectomy  1997  . Esophagogastroduodenoscopy    . Tubal ligation    . Thyroid surgery      Family History  Problem Relation Age of Onset  . Hypertension Father   . Coronary artery disease Father     Allergies  Allergen Reactions  . Aspirin Hives  . Food Other (See Comments)    Lima beans  . Tramadol Other (See Comments)    Headache     Current Outpatient Prescriptions on File Prior to Visit  Medication Sig Dispense Refill  . albuterol (PROVENTIL HFA;VENTOLIN HFA) 108 (90 BASE) MCG/ACT inhaler Inhale 2 puffs into the lungs every 6 (six) hours as needed for wheezing.      Marland Kitchen ALPRAZolam (XANAX) 0.5 MG tablet Take 0.5 mg by mouth at  bedtime as needed for anxiety.      Marland Kitchen amLODipine (NORVASC) 5 MG tablet Take 5 mg by mouth daily.      . cyclobenzaprine (FLEXERIL) 10 MG tablet Take 1 tablet (10 mg total) by mouth 2 (two) times daily as needed for muscle spasms.  10 tablet  0  . desloratadine (CLARINEX) 5 MG tablet Take 5 mg by mouth daily.      Marland Kitchen docusate sodium (COLACE) 100 MG capsule Take 100 mg by mouth daily.       Marland Kitchen levothyroxine (SYNTHROID, LEVOTHROID) 150 MCG tablet TAKE 1 TABLET BY MOUTH EVERY DAY BEFORE BREAKFAST  30 tablet  5  . metoprolol succinate (TOPROL-XL) 50 MG 24 hr tablet Take 50 mg by mouth daily. Take with or immediately following a meal.      . Multiple Vitamin (MULTIVITAMIN) tablet Take 1 tablet by mouth daily.        Marland Kitchen omeprazole (PRILOSEC) 40 MG capsule Take 40 mg by mouth daily.      . promethazine (PHENERGAN) 25 MG tablet Take 1 tablet (25 mg total) by mouth every 6 (six) hours as needed for nausea or vomiting.  30 tablet  2  .  valsartan (DIOVAN) 160 MG tablet TAKE 1 TABLET BY MOUTH EVERY DAY  30 tablet  5   No current facility-administered medications on file prior to visit.    BP 130/80  Pulse 76  Temp(Src) 98.6 F (37 C) (Oral)  Resp 20  Ht 5\' 7"  (1.702 m)  Wt 215 lb (97.523 kg)  BMI 33.67 kg/m2  SpO2 98%     Review of Systems  Constitutional: Negative.   HENT: Negative for congestion, dental problem, hearing loss, rhinorrhea, sinus pressure, sore throat and tinnitus.   Eyes: Negative for pain, discharge and visual disturbance.  Respiratory: Negative for cough and shortness of breath.   Cardiovascular: Negative for chest pain, palpitations and leg swelling.  Gastrointestinal: Negative for nausea, vomiting, abdominal pain, diarrhea, constipation, blood in stool and abdominal distention.  Genitourinary: Negative for dysuria, urgency, frequency, hematuria, flank pain, vaginal bleeding, vaginal discharge, difficulty urinating, vaginal pain and pelvic pain.  Musculoskeletal: Negative for  arthralgias, gait problem and joint swelling.  Skin: Negative for rash.  Neurological: Negative for dizziness, syncope, speech difficulty, weakness, numbness and headaches.  Hematological: Negative for adenopathy.  Psychiatric/Behavioral: Negative for behavioral problems, dysphoric mood and agitation. The patient is not nervous/anxious.        Objective:   Physical Exam  Constitutional: She appears well-developed and well-nourished. No distress.  Genitourinary: No vaginal discharge found.  Pelvic examination performed Scanty white vaginal discharge Minimal erythema of the mucosa          Assessment & Plan:   Vaginitis we'll treat with intravaginal metronidazole Hypertension Chronic kidney disease

## 2014-06-01 ENCOUNTER — Encounter: Payer: Self-pay | Admitting: Gastroenterology

## 2014-06-04 ENCOUNTER — Encounter: Payer: Self-pay | Admitting: Gastroenterology

## 2014-06-05 ENCOUNTER — Ambulatory Visit: Payer: BC Managed Care – PPO | Admitting: Internal Medicine

## 2014-06-07 ENCOUNTER — Ambulatory Visit (INDEPENDENT_AMBULATORY_CARE_PROVIDER_SITE_OTHER): Payer: BC Managed Care – PPO | Admitting: Internal Medicine

## 2014-06-07 ENCOUNTER — Encounter: Payer: Self-pay | Admitting: Internal Medicine

## 2014-06-07 VITALS — BP 124/80 | HR 73 | Temp 98.7°F | Resp 20 | Ht 65.5 in | Wt 214.0 lb

## 2014-06-07 DIAGNOSIS — N390 Urinary tract infection, site not specified: Secondary | ICD-10-CM

## 2014-06-07 DIAGNOSIS — E039 Hypothyroidism, unspecified: Secondary | ICD-10-CM

## 2014-06-07 DIAGNOSIS — Z0000 Encounter for general adult medical examination without abnormal findings: Secondary | ICD-10-CM

## 2014-06-07 DIAGNOSIS — M545 Low back pain, unspecified: Secondary | ICD-10-CM

## 2014-06-07 DIAGNOSIS — E785 Hyperlipidemia, unspecified: Secondary | ICD-10-CM

## 2014-06-07 DIAGNOSIS — R350 Frequency of micturition: Secondary | ICD-10-CM

## 2014-06-07 DIAGNOSIS — Z23 Encounter for immunization: Secondary | ICD-10-CM

## 2014-06-07 LAB — POCT URINALYSIS DIPSTICK
BILIRUBIN UA: NEGATIVE
Glucose, UA: NEGATIVE
KETONES UA: NEGATIVE
Nitrite, UA: NEGATIVE
PH UA: 6
SPEC GRAV UA: 1.02
Urobilinogen, UA: 0.2

## 2014-06-07 MED ORDER — CIPROFLOXACIN HCL 500 MG PO TABS: 500.0000 mg | ORAL_TABLET | Freq: Two times a day (BID) | ORAL | Status: DC

## 2014-06-07 NOTE — Progress Notes (Signed)
Subjective:    Patient ID: Rhonda [name], female    DOB: 1971/02/17, 43 y.o.   MRN: [id number]  HPI 43 year old patient who is seen today for a preventive health examination.  Medical chart reviewed.  She has been seen a number of times over the past year, including 2 ED visits.  She is also followed annually by nephrology.  Laboratory studies over the past year.  Reviewed. She was seen last month with a vaginal infection and pelvis was performed at that time.  She does get annual mammograms Complaints today include low back pain, suprapubic pain, urinary frequency and dysuria  Past Medical History  Diagnosis Date  . Allergy   . Hypertension   . IBS (irritable bowel syndrome)   . Hyperlipemia   . Anxiety   . GERD (gastroesophageal reflux disease)   . Chronic constipation   . Thyroid disease   . Nephropathy   . Kidney disease     History   Social History  . Marital Status: Divorced    Spouse Name: N/A    Number of Children: 2  . Years of Education: N/A   Occupational History  . NURSING ASSISTANT    Social History Main Topics  . Smoking status: Never Smoker   . Smokeless tobacco: Never Used  . Alcohol Use: No  . Drug Use: No  . Sexual Activity: Yes    Birth Control/ Protection: Surgical   Other Topics Concern  . Not on file   Social History Narrative  . No narrative on file    Past Surgical History  Procedure Laterality Date  . Abdominal hysterectomy  1998  . Cholecystectomy  1997  . Esophagogastroduodenoscopy    . Tubal ligation    . Thyroid surgery      Family History  Problem Relation Age of Onset  . Hypertension Father   . Coronary artery disease Father     Allergies  Allergen Reactions  . Aspirin Hives  . Food Other (See Comments)    Lima beans  . Tramadol Other (See Comments)    Headache     Current Outpatient Prescriptions on File Prior to Visit  Medication Sig Dispense Refill  . albuterol (PROVENTIL HFA;VENTOLIN HFA) 108 (90 BASE)  MCG/ACT inhaler Inhale 2 puffs into the lungs every 6 (six) hours as needed for wheezing.      Marland Kitchen ALPRAZolam (XANAX) 0.5 MG tablet Take 0.5 mg by mouth at bedtime as needed for anxiety.      Marland Kitchen amLODipine (NORVASC) 5 MG tablet Take 5 mg by mouth daily.      . cyclobenzaprine (FLEXERIL) 10 MG tablet Take 1 tablet (10 mg total) by mouth 2 (two) times daily as needed for muscle spasms.  10 tablet  0  . desloratadine (CLARINEX) 5 MG tablet Take 5 mg by mouth daily.      Marland Kitchen docusate sodium (COLACE) 100 MG capsule Take 100 mg by mouth daily.       Marland Kitchen levothyroxine (SYNTHROID, LEVOTHROID) 150 MCG tablet TAKE 1 TABLET BY MOUTH EVERY DAY BEFORE BREAKFAST  30 tablet  5  . metoprolol succinate (TOPROL-XL) 50 MG 24 hr tablet Take 50 mg by mouth daily. Take with or immediately following a meal.      . Multiple Vitamin (MULTIVITAMIN) tablet Take 1 tablet by mouth daily.        Marland Kitchen omeprazole (PRILOSEC) 40 MG capsule Take 40 mg by mouth daily.      . promethazine (PHENERGAN) 25 MG tablet  Take 1 tablet (25 mg total) by mouth every 6 (six) hours as needed for nausea or vomiting.  30 tablet  2  . valsartan (DIOVAN) 160 MG tablet TAKE 1 TABLET BY MOUTH EVERY DAY  30 tablet  5   No current facility-administered medications on file prior to visit.    BP 124/80  Pulse 73  Temp(Src) 98.7 F (37.1 C) (Oral)  Resp 20  Ht 5' 5.5" (1.664 m)  Wt 214 lb (97.07 kg)  BMI 35.06 kg/m2  SpO2 98%      Review of Systems  Constitutional: Negative for fever, appetite change, fatigue and unexpected weight change.  HENT: Negative for congestion, dental problem, ear pain, hearing loss, mouth sores, nosebleeds, sinus pressure, sore throat, tinnitus, trouble swallowing and voice change.   Eyes: Negative for photophobia, pain, redness and visual disturbance.  Respiratory: Negative for cough, chest tightness and shortness of breath.   Cardiovascular: Negative for chest pain, palpitations and leg swelling.  Gastrointestinal:  Negative for nausea, vomiting, abdominal pain, diarrhea, constipation, blood in stool, abdominal distention and rectal pain.  Genitourinary: Positive for dysuria and frequency. Negative for urgency, hematuria, flank pain, vaginal bleeding, vaginal discharge, difficulty urinating, genital sores, vaginal pain, menstrual problem and pelvic pain.  Musculoskeletal: Negative for arthralgias, back pain and neck stiffness.  Skin: Negative for rash.  Neurological: Negative for dizziness, syncope, speech difficulty, weakness, light-headedness, numbness and headaches.  Hematological: Negative for adenopathy. Does not bruise/bleed easily.  Psychiatric/Behavioral: Negative for suicidal ideas, behavioral problems, self-injury, dysphoric mood and agitation. The patient is not nervous/anxious.        Objective:   Physical Exam  Constitutional: She is oriented to person, place, and time. She appears well-developed and well-nourished.  HENT:  Head: Normocephalic and atraumatic.  Right Ear: External ear normal.  Left Ear: External ear normal.  Mouth/Throat: Oropharynx is clear and moist.  Eyes: Conjunctivae and EOM are normal.  Neck: Normal range of motion. Neck supple. No JVD present. No thyromegaly present.  Cardiovascular: Normal rate, regular rhythm, normal heart sounds and intact distal pulses.   No murmur heard. Pulmonary/Chest: Effort normal and breath sounds normal. She has no wheezes. She has no rales.  Abdominal: Soft. Bowel sounds are normal. She exhibits no distension and no mass. There is no tenderness. There is no rebound and no guarding.  Musculoskeletal: Normal range of motion. She exhibits no edema and no tenderness.  Neurological: She is alert and oriented to person, place, and time. She has normal reflexes. No cranial nerve deficit. She exhibits normal muscle tone. Coordination normal.  Skin: Skin is warm and dry. No rash noted.  Psychiatric: She has a normal mood and affect. Her behavior  is normal.          Assessment & Plan:   Preventive health examination Chronic kidney disease.  Followup with nephrology, and yearly Hypertension well controlled Hypothyroidism.  We'll check TSH Gastroesophageal reflux disease.  Continue omeprazole

## 2014-06-07 NOTE — Patient Instructions (Signed)
It is important that you exercise regularly, at least 20 minutes 3 to 4 times per week.  If you develop chest pain or shortness of breath seek  medical attention.  Limit your sodium (Salt) intake  Return in 6 months for follow-up  Health Maintenance Adopting a healthy lifestyle and getting preventive care can go a long way to promote health and wellness. Talk with your health care provider about what schedule of regular examinations is right for you. This is a good chance for you to check in with your provider about disease prevention and staying healthy. In between checkups, there are plenty of things you can do on your own. Experts have done a lot of research about which lifestyle changes and preventive measures are most likely to keep you healthy. Ask your health care provider for more information. WEIGHT AND DIET  Eat a healthy diet  Be sure to include plenty of vegetables, fruits, low-fat dairy products, and lean protein.  Do not eat a lot of foods high in solid fats, added sugars, or salt.  Get regular exercise. This is one of the most important things you can do for your health.  Most adults should exercise for at least 150 minutes each week. The exercise should increase your heart rate and make you sweat (moderate-intensity exercise).  Most adults should also do strengthening exercises at least twice a week. This is in addition to the moderate-intensity exercise.  Maintain a healthy weight  Body mass index (BMI) is a measurement that can be used to identify possible weight problems. It estimates body fat based on height and weight. Your health care provider can help determine your BMI and help you achieve or maintain a healthy weight.  For females 30 years of age and older:   A BMI below 18.5 is considered underweight.  A BMI of 18.5 to 24.9 is normal.  A BMI of 25 to 29.9 is considered overweight.  A BMI of 30 and above is considered obese.  Watch levels of cholesterol  and blood lipids  You should start having your blood tested for lipids and cholesterol at 45 years of age, then have this test every 5 years.  You may need to have your cholesterol levels checked more often if:  Your lipid or cholesterol levels are high.  You are older than 45 years of age.  You are at high risk for heart disease.  CANCER SCREENING   Lung Cancer  Lung cancer screening is recommended for adults 47-53 years old who are at high risk for lung cancer because of a history of smoking.  A yearly low-dose CT scan of the lungs is recommended for people who:  Currently smoke.  Have quit within the past 15 years.  Have at least a 30-pack-year history of smoking. A pack year is smoking an average of one pack of cigarettes a day for 1 year.  Yearly screening should continue until it has been 15 years since you quit.  Yearly screening should stop if you develop a health problem that would prevent you from having lung cancer treatment.  Breast Cancer  Practice breast self-awareness. This means understanding how your breasts normally appear and feel.  It also means doing regular breast self-exams. Let your health care provider know about any changes, no matter how small.  If you are in your 20s or 30s, you should have a clinical breast exam (CBE) by a health care provider every 1-3 years as part of a regular health  exam.  If you are 40 or older, have a CBE every year. Also consider having a breast X-ray (mammogram) every year.  If you have a family history of breast cancer, talk to your health care provider about genetic screening.  If you are at high risk for breast cancer, talk to your health care provider about having an MRI and a mammogram every year.  Breast cancer gene (BRCA) assessment is recommended for women who have family members with BRCA-related cancers. BRCA-related cancers include:  Breast.  Ovarian.  Tubal.  Peritoneal cancers.  Results of the  assessment will determine the need for genetic counseling and BRCA1 and BRCA2 testing. Cervical Cancer Routine pelvic examinations to screen for cervical cancer are no longer recommended for nonpregnant women who are considered low risk for cancer of the pelvic organs (ovaries, uterus, and vagina) and who do not have symptoms. A pelvic examination may be necessary if you have symptoms including those associated with pelvic infections. Ask your health care provider if a screening pelvic exam is right for you.   The Pap test is the screening test for cervical cancer for women who are considered at risk.  If you had a hysterectomy for a problem that was not cancer or a condition that could lead to cancer, then you no longer need Pap tests.  If you are older than 65 years, and you have had normal Pap tests for the past 10 years, you no longer need to have Pap tests.  If you have had past treatment for cervical cancer or a condition that could lead to cancer, you need Pap tests and screening for cancer for at least 20 years after your treatment.  If you no longer get a Pap test, assess your risk factors if they change (such as having a new sexual partner). This can affect whether you should start being screened again.  Some women have medical problems that increase their chance of getting cervical cancer. If this is the case for you, your health care provider may recommend more frequent screening and Pap tests.  The human papillomavirus (HPV) test is another test that may be used for cervical cancer screening. The HPV test looks for the virus that can cause cell changes in the cervix. The cells collected during the Pap test can be tested for HPV.  The HPV test can be used to screen women 72 years of age and older. Getting tested for HPV can extend the interval between normal Pap tests from three to five years.  An HPV test also should be used to screen women of any age who have unclear Pap test  results.  After 46 years of age, women should have HPV testing as often as Pap tests.  Colorectal Cancer  This type of cancer can be detected and often prevented.  Routine colorectal cancer screening usually begins at 45 years of age and continues through 45 years of age.  Your health care provider may recommend screening at an earlier age if you have risk factors for colon cancer.  Your health care provider may also recommend using home test kits to check for hidden blood in the stool.  A small camera at the end of a tube can be used to examine your colon directly (sigmoidoscopy or colonoscopy). This is done to check for the earliest forms of colorectal cancer.  Routine screening usually begins at age 9.  Direct examination of the colon should be repeated every 5-10 years through 45 years of  age. However, you may need to be screened more often if early forms of precancerous polyps or small growths are found. Skin Cancer  Check your skin from head to toe regularly.  Tell your health care provider about any new moles or changes in moles, especially if there is a change in a mole's shape or color.  Also tell your health care provider if you have a mole that is larger than the size of a pencil eraser.  Always use sunscreen. Apply sunscreen liberally and repeatedly throughout the day.  Protect yourself by wearing long sleeves, pants, a wide-brimmed hat, and sunglasses whenever you are outside. HEART DISEASE, DIABETES, AND HIGH BLOOD PRESSURE   Have your blood pressure checked at least every 1-2 years. High blood pressure causes heart disease and increases the risk of stroke.  If you are between 98 years and 20 years old, ask your health care provider if you should take aspirin to prevent strokes.  Have regular diabetes screenings. This involves taking a blood sample to check your fasting blood sugar level.  If you are at a normal weight and have a low risk for diabetes, have this  test once every three years after 45 years of age.  If you are overweight and have a high risk for diabetes, consider being tested at a younger age or more often. PREVENTING INFECTION  Hepatitis B  If you have a higher risk for hepatitis B, you should be screened for this virus. You are considered at high risk for hepatitis B if:  You were born in a country where hepatitis B is common. Ask your health care provider which countries are considered high risk.  Your parents were born in a high-risk country, and you have not been immunized against hepatitis B (hepatitis B vaccine).  You have HIV or AIDS.  You use needles to inject street drugs.  You live with someone who has hepatitis B.  You have had sex with someone who has hepatitis B.  You get hemodialysis treatment.  You take certain medicines for conditions, including cancer, organ transplantation, and autoimmune conditions. Hepatitis C  Blood testing is recommended for:  Everyone born from [birth year] through 1967.  Anyone with known risk factors for hepatitis C. Sexually transmitted infections (STIs)  You should be screened for sexually transmitted infections (STIs) including gonorrhea and chlamydia if:  You are sexually active and are younger than 45 years of age.  You are older than 45 years of age and your health care provider tells you that you are at risk for this type of infection.  Your sexual activity has changed since you were last screened and you are at an increased risk for chlamydia or gonorrhea. Ask your health care provider if you are at risk.  If you do not have HIV, but are at risk, it may be recommended that you take a prescription medicine daily to prevent HIV infection. This is called pre-exposure prophylaxis (PrEP). You are considered at risk if:  You are sexually active and do not regularly use condoms or know the HIV status of your partner(s).  You take drugs by injection.  You are sexually active with  a partner who has HIV. Talk with your health care provider about whether you are at high risk of being infected with HIV. If you choose to begin PrEP, you should first be tested for HIV. You should then be tested every 3 months for as long as you are taking PrEP.  PREGNANCY  If you are premenopausal and you may become pregnant, ask your health care provider about preconception counseling.  If you may become pregnant, take 400 to 800 micrograms (mcg) of folic acid every day.  If you want to prevent pregnancy, talk to your health care provider about birth control (contraception). OSTEOPOROSIS AND MENOPAUSE   Osteoporosis is a disease in which the bones lose minerals and strength with aging. This can result in serious bone fractures. Your risk for osteoporosis can be identified using a bone density scan.  If you are 65 years of age or older, or if you are at risk for osteoporosis and fractures, ask your health care provider if you should be screened.  Ask your health care provider whether you should take a calcium or vitamin D supplement to lower your risk for osteoporosis.  Menopause may have certain physical symptoms and risks.  Hormone replacement therapy may reduce some of these symptoms and risks. Talk to your health care provider about whether hormone replacement therapy is right for you.  HOME CARE INSTRUCTIONS   Schedule regular health, dental, and eye exams.  Stay current with your immunizations.   Do not use any tobacco products including cigarettes, chewing tobacco, or electronic cigarettes.  If you are pregnant, do not drink alcohol.  If you are breastfeeding, limit how much and how often you drink alcohol.  Limit alcohol intake to no more than 1 drink per day for nonpregnant women. One drink equals 12 ounces of beer, 5 ounces of wine, or 1 ounces of hard liquor.  Do not use street drugs.  Do not share needles.  Ask your health care provider for help if you need  support or information about quitting drugs.  Tell your health care provider if you often feel depressed.  Tell your health care provider if you have ever been abused or do not feel safe at home. Document Released: 02/16/2011 Document Revised: 12/18/2013 Document Reviewed: 07/05/2013 [employer] Patient Information 2015 [employer]. This information is not intended to replace advice given to you by your health care provider. Make sure you discuss any questions you have with your health care provider.  

## 2014-06-07 NOTE — Progress Notes (Signed)
Pre visit review using our clinic review tool, if applicable. No additional management support is needed unless otherwise documented below in the visit note. 

## 2014-06-08 LAB — LIPID PANEL
CHOL/HDL RATIO: 5
Cholesterol: 201 mg/dL — ABNORMAL HIGH (ref 0–200)
HDL: 38.3 mg/dL — AB (ref >39.00)
LDL Cholesterol: 140 mg/dL — ABNORMAL HIGH (ref 0–99)
NonHDL: 162.7
Triglycerides: 113 mg/dL (ref 0.0–149.0)
VLDL: 22.6 mg/dL (ref 0.0–40.0)

## 2014-06-08 LAB — TSH: TSH: 0.48 u[IU]/mL (ref 0.35–4.50)

## 2014-06-14 ENCOUNTER — Emergency Department (HOSPITAL_COMMUNITY)
Admission: EM | Admit: 2014-06-14 | Discharge: 2014-06-14 | Disposition: A | Discharge status: Home / Self Care | Payer: BC Managed Care – PPO | Attending: Emergency Medicine | Admitting: Emergency Medicine

## 2014-06-14 ENCOUNTER — Encounter (HOSPITAL_COMMUNITY): Payer: Self-pay | Admitting: Emergency Medicine

## 2014-06-14 ENCOUNTER — Emergency Department (HOSPITAL_COMMUNITY): Payer: BC Managed Care – PPO

## 2014-06-14 DIAGNOSIS — I10 Essential (primary) hypertension: Secondary | ICD-10-CM | POA: Diagnosis not present

## 2014-06-14 DIAGNOSIS — K5900 Constipation, unspecified: Secondary | ICD-10-CM | POA: Diagnosis not present

## 2014-06-14 DIAGNOSIS — E079 Disorder of thyroid, unspecified: Secondary | ICD-10-CM | POA: Insufficient documentation

## 2014-06-14 DIAGNOSIS — Z79899 Other long term (current) drug therapy: Secondary | ICD-10-CM | POA: Diagnosis not present

## 2014-06-14 DIAGNOSIS — Z7952 Long term (current) use of systemic steroids: Secondary | ICD-10-CM | POA: Insufficient documentation

## 2014-06-14 DIAGNOSIS — Z87448 Personal history of other diseases of urinary system: Secondary | ICD-10-CM | POA: Insufficient documentation

## 2014-06-14 DIAGNOSIS — Z8659 Personal history of other mental and behavioral disorders: Secondary | ICD-10-CM | POA: Insufficient documentation

## 2014-06-14 DIAGNOSIS — M545 Low back pain: Secondary | ICD-10-CM | POA: Diagnosis not present

## 2014-06-14 DIAGNOSIS — Z792 Long term (current) use of antibiotics: Secondary | ICD-10-CM | POA: Diagnosis not present

## 2014-06-14 DIAGNOSIS — Z86718 Personal history of other venous thrombosis and embolism: Secondary | ICD-10-CM | POA: Diagnosis not present

## 2014-06-14 DIAGNOSIS — J9801 Acute bronchospasm: Secondary | ICD-10-CM | POA: Diagnosis not present

## 2014-06-14 DIAGNOSIS — K219 Gastro-esophageal reflux disease without esophagitis: Secondary | ICD-10-CM | POA: Insufficient documentation

## 2014-06-14 DIAGNOSIS — Z86711 Personal history of pulmonary embolism: Secondary | ICD-10-CM

## 2014-06-14 DIAGNOSIS — R079 Chest pain, unspecified: Secondary | ICD-10-CM

## 2014-06-14 LAB — CBC
HCT: 39.5 % (ref 36.0–46.0)
Hemoglobin: 13 g/dL (ref 12.0–15.0)
MCH: 28.1 pg (ref 26.0–34.0)
MCHC: 32.9 g/dL (ref 30.0–36.0)
MCV: 85.3 fL (ref 78.0–100.0)
Platelets: 258 10*3/uL (ref 150–400)
RBC: 4.63 MIL/uL (ref 3.87–5.11)
RDW: 13.1 % (ref 11.5–15.5)
WBC: 7.9 10*3/uL (ref 4.0–10.5)

## 2014-06-14 LAB — BASIC METABOLIC PANEL
Anion gap: 11 (ref 5–15)
BUN: 20 mg/dL (ref 6–23)
CALCIUM: 9.7 mg/dL (ref 8.4–10.5)
CO2: 25 mEq/L (ref 19–32)
Chloride: 104 mEq/L (ref 96–112)
Creatinine, Ser: 1.32 mg/dL — ABNORMAL HIGH (ref 0.50–1.10)
GFR calc Af Amer: 55 mL/min — ABNORMAL LOW (ref >90)
GFR, EST NON AFRICAN AMERICAN: 48 mL/min — AB (ref >90)
Glucose, Bld: 80 mg/dL (ref 70–99)
Potassium: 4.4 mEq/L (ref 3.7–5.3)
SODIUM: 140 meq/L (ref 137–147)

## 2014-06-14 LAB — I-STAT TROPONIN, ED: Troponin i, poc: 0.01 ng/mL (ref 0.00–0.08)

## 2014-06-14 LAB — D-DIMER, QUANTITATIVE: D-Dimer, Quant: 2.54 ug/mL-FEU — ABNORMAL HIGH (ref 0.00–0.48)

## 2014-06-14 MED ORDER — HYDROMORPHONE HCL 1 MG/ML IJ SOLN
0.5000 mg | Freq: Once | INTRAMUSCULAR | Status: AC
  Administered 2014-06-14: 0.5 mg via INTRAVENOUS

## 2014-06-14 MED ORDER — IOHEXOL 350 MG/ML SOLN
100.0000 mL | Freq: Once | INTRAVENOUS | Status: AC | PRN
  Administered 2014-06-14: 100 mL via INTRAVENOUS

## 2014-06-14 MED ORDER — ALBUTEROL SULFATE (2.5 MG/3ML) 0.083% IN NEBU
5.0000 mg | INHALATION_SOLUTION | Freq: Once | RESPIRATORY_TRACT | Status: AC
  Administered 2014-06-14: 5 mg via RESPIRATORY_TRACT
  Filled 2014-06-14: qty 6

## 2014-06-14 MED ORDER — PREDNISONE 20 MG PO TABS
60.0000 mg | ORAL_TABLET | Freq: Once | ORAL | Status: AC
  Administered 2014-06-14: 60 mg via ORAL
  Filled 2014-06-14: qty 3

## 2014-06-14 MED ORDER — IPRATROPIUM BROMIDE 0.02 % IN SOLN
0.5000 mg | Freq: Once | RESPIRATORY_TRACT | Status: AC
  Administered 2014-06-14: 0.5 mg via RESPIRATORY_TRACT
  Filled 2014-06-14: qty 2.5

## 2014-06-14 MED ORDER — ALBUTEROL SULFATE HFA 108 (90 BASE) MCG/ACT IN AERS
2.0000 | INHALATION_SPRAY | RESPIRATORY_TRACT | Status: DC
  Administered 2014-06-14: 2 via RESPIRATORY_TRACT
  Filled 2014-06-14: qty 6.7

## 2014-06-14 MED ORDER — HYDROMORPHONE HCL 1 MG/ML IJ SOLN
1.0000 mg | Freq: Once | INTRAMUSCULAR | Status: DC
Start: 2014-06-14 — End: 2014-06-14
  Filled 2014-06-14: qty 1

## 2014-06-14 MED ORDER — PREDNISONE 10 MG PO TABS: 60.0000 mg | ORAL_TABLET | Freq: Every day | ORAL | Status: DC

## 2014-06-14 NOTE — ED Notes (Signed)
MD Campos at bedside.  

## 2014-06-14 NOTE — ED Notes (Signed)
Pt returned from CT and placed back on the monitor.

## 2014-06-14 NOTE — Discharge Instructions (Signed)
Bronchospasm °A bronchospasm is a spasm or tightening of the airways going into the lungs. During a bronchospasm breathing becomes more difficult because the airways get smaller. When this happens there can be coughing, a whistling sound when breathing (wheezing), and difficulty breathing. Bronchospasm is often associated with asthma, but not all patients who experience a bronchospasm have asthma. °CAUSES  °A bronchospasm is caused by inflammation or irritation of the airways. The inflammation or irritation may be triggered by:  °· Allergies (such as to animals, pollen, food, or mold). Allergens that cause bronchospasm may cause wheezing immediately after exposure or many hours later.   °· Infection. Viral infections are believed to be the most common cause of bronchospasm.   °· Exercise.   °· Irritants (such as pollution, cigarette smoke, strong odors, aerosol sprays, and paint fumes).   °· Weather changes. Winds increase molds and pollens in the air. Rain refreshes the air by washing irritants out. Cold air may cause inflammation.   °· Stress and emotional upset.   °SIGNS AND SYMPTOMS  °· Wheezing.   °· Excessive nighttime coughing.   °· Frequent or severe coughing with a simple cold.   °· Chest tightness.   °· Shortness of breath.   °DIAGNOSIS  °Bronchospasm is usually diagnosed through a history and physical exam. Tests, such as chest X-rays, are sometimes done to look for other conditions. °TREATMENT  °· Inhaled medicines can be given to open up your airways and help you breathe. The medicines can be given using either an inhaler or a nebulizer machine. °· Corticosteroid medicines may be given for severe bronchospasm, usually when it is associated with asthma. °HOME CARE INSTRUCTIONS  °· Always have a plan prepared for seeking medical care. Know when to call your health care provider and local emergency services (911 in the [place] Know where you can access local emergency care. °· Only take medicines as  directed by your health care provider. °· If you were prescribed an inhaler or nebulizer machine, ask your health care provider to explain how to use it correctly. Always use a spacer with your inhaler if you were given one. °· It is necessary to remain calm during an attack. Try to relax and breathe more slowly.  °· Control your home environment in the following ways:   °¨ Change your heating and air conditioning filter at least once a month.   °¨ Limit your use of fireplaces and wood stoves. °¨ Do not smoke and do not allow smoking in your home.   °¨ Avoid exposure to perfumes and fragrances.   °¨ Get rid of pests (such as roaches and mice) and their droppings.   °¨ Throw away plants if you see mold on them.   °¨ Keep your house clean and dust free.   °¨ Replace carpet with wood, tile, or vinyl flooring. Carpet can trap dander and dust.   °¨ Use allergy-proof pillows, mattress covers, and box spring covers.   °¨ Wash bed sheets and blankets every week in hot water and dry them in a dryer.   °¨ Use blankets that are made of polyester or cotton.   °¨ Wash hands frequently. °SEEK MEDICAL CARE IF:  °· You have muscle aches.   °· You have chest pain.   °· The sputum changes from clear or white to yellow, green, gray, or bloody.   °· The sputum you cough up gets thicker.   °· There are problems that may be related to the medicine you are given, such as a rash, itching, swelling, or trouble breathing.   °SEEK IMMEDIATE MEDICAL CARE IF:  °· You have worsening wheezing and coughing even   after taking your prescribed medicines.   °· You have increased difficulty breathing.   °· You develop severe chest pain. °MAKE SURE YOU:  °· Understand these instructions. °· Will watch your condition. °· Will get help right away if you are not doing well or get worse. °Document Released: 08/06/2003 Document Revised: 08/08/2013 Document Reviewed: 01/23/2013 °[employer]® Patient Information ©2015 [employer]. This information is not  intended to replace advice given to you by your health care provider. Make sure you discuss any questions you have with your health care provider. ° °

## 2014-06-14 NOTE — ED Provider Notes (Signed)
CSN: [id number]     Arrival date & time 06/14/14  1403 History   First MD Initiated Contact with Patient 06/14/14 1714     Chief Complaint  Patient presents with  . Chest Pain  . Back Pain     (Consider location/radiation/quality/duration/timing/severity/associated sxs/prior Treatment) The history is provided by the patient.   patient reports developing chest pain as well as back pain last night with associated cough.  Patient has a history of prior DVT and pulmonary embolism.  She is no longer on anticoagulation.  She was on this for an isolated 6 months.  She denies pleuritic component of her pain.  Her cough is worse when she lies back.  No history of asthma or reactive airway disease.  She does report semi-productive cough.  No fevers or chills.  Mild shortness of breath.  Past Medical History  Diagnosis Date  . Allergy   . Hypertension   . IBS (irritable bowel syndrome)   . Hyperlipemia   . Anxiety   . GERD (gastroesophageal reflux disease)   . Chronic constipation   . Thyroid disease   . Nephropathy   . Kidney disease    Past Surgical History  Procedure Laterality Date  . Abdominal hysterectomy  1998  . Cholecystectomy  1997  . Esophagogastroduodenoscopy    . Tubal ligation    . Thyroid surgery     Family History  Problem Relation Age of Onset  . Hypertension Father   . Coronary artery disease Father    History  Substance Use Topics  . Smoking status: Never Smoker   . Smokeless tobacco: Never Used  . Alcohol Use: No   OB History   Grav Para Term Preterm Abortions TAB SAB Ect Mult Living                 Review of Systems  Cardiovascular: Positive for chest pain.  Musculoskeletal: Positive for back pain.  All other systems reviewed and are negative.     Allergies  Aspirin; Food; and Tramadol  Home Medications   Prior to Admission medications   Medication Sig Start Date End Date Taking? Authorizing Provider  amLODipine (NORVASC) 5 MG tablet Take 5  mg by mouth daily.   Yes Historical Provider, MD  docusate sodium (COLACE) 100 MG capsule Take 100 mg by mouth daily.    Yes Historical Provider, MD  levothyroxine (SYNTHROID, LEVOTHROID) 150 MCG tablet TAKE 1 TABLET BY MOUTH EVERY DAY BEFORE BREAKFAST 03/02/14  Yes Marletta Lor, MD  loratadine (CLARITIN) 10 MG tablet Take 10 mg by mouth daily.   Yes Historical Provider, MD  metoprolol succinate (TOPROL-XL) 50 MG 24 hr tablet Take 50 mg by mouth daily. Take with or immediately following a meal.   Yes Historical Provider, MD  Multiple Vitamin (MULTIVITAMIN) tablet Take 1 tablet by mouth daily.     Yes Historical Provider, MD  omeprazole (PRILOSEC) 40 MG capsule Take 40 mg by mouth daily.   Yes Historical Provider, MD  valsartan (DIOVAN) 160 MG tablet TAKE 1 TABLET BY MOUTH EVERY DAY 03/02/14  Yes Marletta Lor, MD  ciprofloxacin (CIPRO) 500 MG tablet Take 1 tablet (500 mg total) by mouth 2 (two) times daily. 06/07/14   Marletta Lor, MD  predniSONE (DELTASONE) 10 MG tablet Take 6 tablets (60 mg total) by mouth daily. 06/14/14   Hoy Morn, MD  promethazine (PHENERGAN) 25 MG tablet Take 1 tablet (25 mg total) by mouth every 6 (six) hours as  needed for nausea or vomiting. 12/04/13   Marletta Lor, MD   BP 139/88  Pulse 70  Temp(Src) 98.7 F (37.1 C) (Oral)  Resp 19  Ht 5' 5.5" (1.664 m)  Wt 214 lb (97.07 kg)  BMI 35.06 kg/m2  SpO2 100% Physical Exam  Nursing note and vitals reviewed. Constitutional: She is oriented to person, place, and time. She appears well-developed and well-nourished. No distress.  HENT:  Head: Normocephalic and atraumatic.  Eyes: EOM are normal.  Neck: Normal range of motion.  Cardiovascular: Normal rate, regular rhythm and normal heart sounds.   Pulmonary/Chest: Effort normal. She has wheezes.  Abdominal: Soft. She exhibits no distension. There is no tenderness.  Musculoskeletal: Normal range of motion.  Neurological: She is alert and  oriented to person, place, and time.  Skin: Skin is warm and dry.  Psychiatric: She has a normal mood and affect. Judgment normal.    ED Course  Procedures (including critical care time) Labs Review Labs Reviewed  BASIC METABOLIC PANEL - Abnormal; Notable for the following:    Creatinine, Ser 1.32 (*)    GFR calc non Af Amer 48 (*)    GFR calc Af Amer 55 (*)    All other components within normal limits  D-DIMER, QUANTITATIVE - Abnormal; Notable for the following:    D-Dimer, Quant 2.54 (*)    All other components within normal limits  CBC  I-STAT TROPOININ, ED    Imaging Review Dg Chest 2 View  06/14/2014   CLINICAL DATA:  Chest pain and shortness of breath  EXAM: CHEST  2 VIEW  COMPARISON:  02/26/2014  FINDINGS: The heart size and mediastinal contours are within normal limits. Both lungs are clear. The visualized skeletal structures are unremarkable. Postsurgical changes are noted at the thoracic inlet.  IMPRESSION: No active cardiopulmonary disease.   Electronically Signed   By: Inez Catalina M.D.   On: 06/14/2014 15:52   Ct Angio Chest Pe W/cm &/or Wo Cm  06/14/2014   CLINICAL DATA:  Chest pain, back pain  EXAM: CT ANGIOGRAPHY CHEST WITH CONTRAST  TECHNIQUE: Multidetector CT imaging of the chest was performed using the standard protocol during bolus administration of intravenous contrast. Multiplanar CT image reconstructions and MIPs were obtained to evaluate the vascular anatomy.  CONTRAST:  144mL OMNIPAQUE IOHEXOL 350 MG/ML SOLN  COMPARISON:  None.  FINDINGS: There is adequate opacification of the pulmonary arteries. There is no pulmonary embolus. The main pulmonary artery, right main pulmonary artery and left main pulmonary arteries are normal in size. The heart size is normal. There is no pericardial effusion.  The lungs are clear. There is no focal consolidation, pleural effusion or pneumothorax.  There is no axillary, hilar, or mediastinal adenopathy.  There is no lytic or blastic  osseous lesion.  The visualized portions of the upper abdomen are unremarkable.  Surgical clips in the thyroid bed from prior thyroidectomy.  Review of the MIP images confirms the above findings.  IMPRESSION: 1. No evidence of pulmonary embolus.   Electronically Signed   By: Kathreen Devoid   On: 06/14/2014 19:08  I personally reviewed the imaging tests through PACS system I reviewed available ER/hospitalization records through the EMR    EKG Interpretation   Date/Time:  Thursday June 14 2014 14:18:18 EDT Ventricular Rate:  79 PR Interval:  166 QRS Duration: 66 QT Interval:  350 QTC Calculation: 401 R Axis:   80 Text Interpretation:  Normal sinus rhythm with sinus arrhythmia  Nonspecific  T wave abnormality Abnormal ECG No significant change was  found Confirmed by Alton Bouknight  MD, Lennette Bihari ([id number]) on 06/14/2014 5:17:57 PM      MDM   Final diagnoses:  History of pulmonary embolism  Bronchospasm  Chest pain, unspecified chest pain type    Patient feels much better at this time.  CT scan is negative for pulmonary embolism.  Discharge home in good condition.  Likely bronchospasm.    Hoy Morn, MD 06/14/14 [id number]

## 2014-06-14 NOTE — ED Notes (Signed)
Pt here for chest pain and back pain, onset last night, hx of same type pain with no known diagnosis.

## 2014-06-15 ENCOUNTER — Telehealth: Payer: Self-pay | Admitting: Internal Medicine

## 2014-06-15 NOTE — Telephone Encounter (Signed)
FYI

## 2014-06-15 NOTE — Telephone Encounter (Signed)
Patient Information:  Caller Name: Adalene  Phone: [phone]  Patient: Rhonda [name], Rhonda [name]  Gender: Female  DOB: 11-07-1970  Age: 43 Years  PCP: Bluford Kaufmann (Family Practice > 91yrs old)  Pregnant: No  Office Follow Up:  Does the office need to follow up with this patient?: No  Instructions For The Office: N/A  RN Note:  Pt attributes itching to the Dilaudid she received in ED since she is allergic to Tramadol.  Symptoms  Reason For Call & Symptoms: Seen in  [facility] ED for CP and back pain; dxed with bronchospasms and given neb tx, Dilaudid for pain, and Prednisone.  Onset 06/14/2014 of mild to moderate itching all over.  Where scratched on arms and legs, "reddened, with whelps".  Reviewed Health History In EMR: Yes  Reviewed Medications In EMR: Yes  Reviewed Allergies In EMR: Yes  Reviewed Surgeries / Procedures: Yes  Date of Onset of Symptoms: 06/14/2014  Treatments Tried: Benadryl 25 mg at 09:00  Treatments Tried Worked: No OB / GYN:  LMP: Unknown  Guideline(s) Used:  Itching - Widespread  Disposition Per Guideline:   Home Care  Reason For Disposition Reached:   Itching of unknown cause and present < 48 hours  Advice Given:  Reassurance - Itching of Unknown Cause:  With a few simple measures, the itching will usually get better in 1 to 2 days.  Here is some care advice that should help.  Don  Try not to scratch.  Itching is often worsened by scratching (the "Itch-Scratch" cycle).  Cut the fingernails short. (Reason: prevent secondary bacterial infection.)  Cool Bath For Flare-Up:  For flare-ups of itching, take a cool bath without soap for 15 minutes, 1-2 times per day. Pat dry using towel - do not rub.  Another option is an Database administrator Designer, television/film set) [employer]. Sprinkle contents of one packet under running faucet. (Caution: this can make bathtub slippery.)  Oral Antihistamine Medication for Itching:  Take an antihistamine by mouth to reduce the itching. Diphenhydramine  (Benadryl) is available over-the-counter. Adult dose is 25-50 mg. Take it up to 4 times a day.  Do not take antihistamine medications if you have prostate problems.  Antihistamines may cause sleepiness. Do not drink, drive, or operate dangerous machinery while taking antihistamines.  An over-the-counter antihistamine that causes less sleepiness is loratadine (e.g., Alavert or Claritin).  Read the package instructions thoroughly on all medications that you take.  Call Back If:  Rash occurs  Itching becomes worse or lasts over 48 hours  You become worse.  Patient Will Follow Care Advice:  YES

## 2014-06-20 ENCOUNTER — Other Ambulatory Visit: Payer: Self-pay | Admitting: Obstetrics and Gynecology

## 2014-06-21 LAB — CYTOLOGY - PAP

## 2014-06-27 ENCOUNTER — Telehealth: Payer: Self-pay | Admitting: Internal Medicine

## 2014-06-27 MED ORDER — HYDROCODONE-HOMATROPINE 5-1.5 MG/5ML PO SYRP: 5.0000 mL | ORAL_SOLUTION | Freq: Four times a day (QID) | ORAL | Status: DC | PRN

## 2014-06-27 NOTE — Telephone Encounter (Signed)
Pt saw dr Raliegh Ip 10/22. Pt states at that time she has upper resp symptoms.  Then 10/29 pt went to ed w/ upper resp, diag brionchial spasms.  Pt states now she has an ongoing cough that seem to be getting worse. (yes it is) Pt was given prednisone at ED, an inhaler and breathing treatment.   Pt would like something for the cough if possible.  [facility]/ [address]

## 2014-06-27 NOTE — Telephone Encounter (Signed)
Please see message and advise 

## 2014-06-27 NOTE — Telephone Encounter (Signed)
Hydromet 6 ounces 1 teaspoon  every 6 hours as needed for cough and congestion 

## 2014-06-27 NOTE — Telephone Encounter (Signed)
Spoke to pt, asked her if she has taken Hycodan cough syrup before with out any problems. Pt stated yes. Told her okay Rx is ready for pickup. Rx printed and signed.

## 2014-06-28 ENCOUNTER — Other Ambulatory Visit: Payer: Self-pay | Admitting: Internal Medicine

## 2014-07-01 ENCOUNTER — Encounter (HOSPITAL_COMMUNITY): Payer: Self-pay | Admitting: *Deleted

## 2014-07-01 ENCOUNTER — Emergency Department (HOSPITAL_COMMUNITY)
Admission: EM | Admit: 2014-07-01 | Discharge: 2014-07-01 | Disposition: A | Discharge status: Home / Self Care | Payer: BC Managed Care – PPO | Attending: Emergency Medicine | Admitting: Emergency Medicine

## 2014-07-01 DIAGNOSIS — E785 Hyperlipidemia, unspecified: Secondary | ICD-10-CM | POA: Insufficient documentation

## 2014-07-01 DIAGNOSIS — Z87448 Personal history of other diseases of urinary system: Secondary | ICD-10-CM | POA: Insufficient documentation

## 2014-07-01 DIAGNOSIS — I10 Essential (primary) hypertension: Secondary | ICD-10-CM | POA: Insufficient documentation

## 2014-07-01 DIAGNOSIS — K5900 Constipation, unspecified: Secondary | ICD-10-CM | POA: Diagnosis not present

## 2014-07-01 DIAGNOSIS — Z792 Long term (current) use of antibiotics: Secondary | ICD-10-CM | POA: Diagnosis not present

## 2014-07-01 DIAGNOSIS — R059 Cough, unspecified: Secondary | ICD-10-CM

## 2014-07-01 DIAGNOSIS — E079 Disorder of thyroid, unspecified: Secondary | ICD-10-CM | POA: Diagnosis not present

## 2014-07-01 DIAGNOSIS — Z7952 Long term (current) use of systemic steroids: Secondary | ICD-10-CM | POA: Diagnosis not present

## 2014-07-01 DIAGNOSIS — Z79899 Other long term (current) drug therapy: Secondary | ICD-10-CM | POA: Insufficient documentation

## 2014-07-01 DIAGNOSIS — R05 Cough: Secondary | ICD-10-CM | POA: Diagnosis present

## 2014-07-01 DIAGNOSIS — R0981 Nasal congestion: Secondary | ICD-10-CM | POA: Diagnosis not present

## 2014-07-01 DIAGNOSIS — K219 Gastro-esophageal reflux disease without esophagitis: Secondary | ICD-10-CM | POA: Insufficient documentation

## 2014-07-01 DIAGNOSIS — Z8659 Personal history of other mental and behavioral disorders: Secondary | ICD-10-CM | POA: Diagnosis not present

## 2014-07-01 MED ORDER — AZITHROMYCIN 250 MG PO TABS
500.0000 mg | ORAL_TABLET | Freq: Once | ORAL | Status: AC
  Administered 2014-07-01: 500 mg via ORAL
  Filled 2014-07-01: qty 2

## 2014-07-01 MED ORDER — PREDNISONE 20 MG PO TABS
60.0000 mg | ORAL_TABLET | Freq: Once | ORAL | Status: AC
  Administered 2014-07-01: 60 mg via ORAL
  Filled 2014-07-01: qty 3

## 2014-07-01 MED ORDER — PREDNISONE 10 MG PO TABS: ORAL_TABLET | ORAL | Status: DC

## 2014-07-01 MED ORDER — AZITHROMYCIN 250 MG PO TABS: 250.0000 mg | ORAL_TABLET | Freq: Every day | ORAL | Status: DC

## 2014-07-01 NOTE — ED Notes (Signed)
Pt reports she has been having cough since last month and has been treated for URI not getting better.  Pt reports dry non-productive cough

## 2014-07-01 NOTE — ED Notes (Signed)
Pt states that she began with a cough and congestion on 10/22 and was diagnosed with URI; pt states that she was seen on 10/29 and diagnosed with bronchospasm; pt states that the cough has continued; pt states that she still has mild congestion; pt states that she has taking prednisone and was on breathing treatments but states that once she was done with medications the cough got worse

## 2014-07-01 NOTE — Discharge Instructions (Signed)
Cough, Adult   A cough is a reflex. It helps you clear your throat and airways. A cough can help heal your body. A cough can last 2 or 3 weeks (acute) or may last more than 8 weeks (chronic). Some common causes of a cough can include an infection, allergy, or a cold.  HOME CARE  · Only take medicine as told by your doctor.  · If given, take your medicines (antibiotics) as told. Finish them even if you start to feel better.  · Use a cold steam vaporizer or humidifier in your home. This can help loosen thick spit (secretions).  · Sleep so you are almost sitting up (semi-upright). Use pillows to do this. This helps reduce coughing.  · Rest as needed.  · Stop smoking if you smoke.  GET HELP RIGHT AWAY IF:  · You have yellowish-white fluid (pus) in your thick spit.  · Your cough gets worse.  · Your medicine does not reduce coughing, and you are losing sleep.  · You cough up blood.  · You have trouble breathing.  · Your pain gets worse and medicine does not help.  · You have a fever.  MAKE SURE YOU:   · Understand these instructions.  · Will watch your condition.  · Will get help right away if you are not doing well or get worse.  Document Released: 04/16/2011 Document Revised: 12/18/2013 Document Reviewed: 04/16/2011  [employer]® Patient Information ©2015 [employer]. This information is not intended to replace advice given to you by your health care provider. Make sure you discuss any questions you have with your health care provider.

## 2014-07-01 NOTE — ED Provider Notes (Signed)
CSN: [id number]     Arrival date & time 07/01/14  2024 History   First MD Initiated Contact with Patient 07/01/14 2121     This chart was scribed for non-physician practitioner, Charlann Lange PA-C, working with Ephraim Hamburger, MD by Forrestine Him, ED Scribe. This patient was seen in room WTR7/WTR7 and the patient's care was started at 10:05 PM.   Chief Complaint  Patient presents with  . Cough   The history is provided by the patient. No language interpreter was used.    HPI Comments: Rhonda [name] is a 43 y.o. female with a PMHx HTN, GERD, and hyperlipidemia who presents to the Emergency Department complaining of a constant, moderate cough x 3 weeks that has progressively worsened in last week. Pt was diagnosed with a URI on 10/22. Pt states she was then diagnosed with bronchospasm on 10/29. States cough has still been ongoing with mild congestion. [name] was started on a 5 day course of Prednisone 60 mg (last dose completed last week) and given breathing treatments with improvement. However, pt states symptoms returned after finishing medications. [name] states cough is not recurrent yearly and denies any previous episodes. Pt with known allergy to Aspirin. No other concerns this visit.  Past Medical History  Diagnosis Date  . Allergy   . Hypertension   . IBS (irritable bowel syndrome)   . Hyperlipemia   . Anxiety   . GERD (gastroesophageal reflux disease)   . Chronic constipation   . Thyroid disease   . Nephropathy   . Kidney disease    Past Surgical History  Procedure Laterality Date  . Abdominal hysterectomy  1998  . Cholecystectomy  1997  . Esophagogastroduodenoscopy    . Tubal ligation    . Thyroid surgery     Family History  Problem Relation Age of Onset  . Hypertension Father   . Coronary artery disease Father    History  Substance Use Topics  . Smoking status: Never Smoker   . Smokeless tobacco: Never Used  . Alcohol Use: No   OB History    No  data available     Review of Systems  Constitutional: Negative for fever and chills.  HENT: Positive for congestion.   Respiratory: Positive for cough.   All other systems reviewed and are negative.     Allergies  Aspirin; Dilaudid; Food; and Tramadol  Home Medications   Prior to Admission medications   Medication Sig Start Date End Date Taking? Authorizing Provider  amLODipine (NORVASC) 5 MG tablet Take 5 mg by mouth daily.    Historical Provider, MD  amLODipine (NORVASC) 5 MG tablet TAKE 1 TABLET BY MOUTH EVERY DAY 06/28/14   Marletta Lor, MD  ciprofloxacin (CIPRO) 500 MG tablet Take 1 tablet (500 mg total) by mouth 2 (two) times daily. 06/07/14   Marletta Lor, MD  docusate sodium (COLACE) 100 MG capsule Take 100 mg by mouth daily.     Historical Provider, MD  HYDROcodone-homatropine (HYCODAN) 5-1.5 MG/5ML syrup Take 5 mLs by mouth every 6 (six) hours as needed for cough. 06/27/14   Marletta Lor, MD  levothyroxine (SYNTHROID, LEVOTHROID) 150 MCG tablet TAKE 1 TABLET BY MOUTH EVERY DAY BEFORE BREAKFAST 03/02/14   Marletta Lor, MD  loratadine (CLARITIN) 10 MG tablet Take 10 mg by mouth daily.    Historical Provider, MD  metoprolol succinate (TOPROL-XL) 50 MG 24 hr tablet Take 50 mg by mouth daily. Take with or immediately following  a meal.    Historical Provider, MD  metoprolol succinate (TOPROL-XL) 50 MG 24 hr tablet TAKE 1 TABLET BY MOUTH EVERY DAY. TAKE IMMEDIATELY FOLLOWING A MEAL 06/28/14   Marletta Lor, MD  Multiple Vitamin (MULTIVITAMIN) tablet Take 1 tablet by mouth daily.      Historical Provider, MD  omeprazole (PRILOSEC) 40 MG capsule Take 40 mg by mouth daily.    Historical Provider, MD  predniSONE (DELTASONE) 10 MG tablet Take 6 tablets (60 mg total) by mouth daily. 06/14/14   Hoy Morn, MD  promethazine (PHENERGAN) 25 MG tablet Take 1 tablet (25 mg total) by mouth every 6 (six) hours as needed for nausea or vomiting. 12/04/13   Marletta Lor, MD  valsartan (DIOVAN) 160 MG tablet TAKE 1 TABLET BY MOUTH EVERY DAY 03/02/14   Marletta Lor, MD   Triage Vitals: BP 110/82 mmHg  Pulse 102  Temp(Src) 98.2 F (36.8 C) (Oral)  Resp 22  SpO2 99%   Physical Exam  Constitutional: She is oriented to person, place, and time. She appears well-developed and well-nourished. No distress.  HENT:  Head: Normocephalic and atraumatic.  Eyes: EOM are normal.  Neck: Normal range of motion. Neck supple.  Pulmonary/Chest: Effort normal and breath sounds normal.  Actively coughing  Abdominal: She exhibits no distension.  Musculoskeletal: Normal range of motion.  Neurological: She is alert and oriented to person, place, and time.  Skin: She is not diaphoretic.  Psychiatric: She has a normal mood and affect.  Nursing note and vitals reviewed.   ED Course  Procedures (including critical care time)  DIAGNOSTIC STUDIES: Oxygen Saturation is 99% on RA, Normal by my interpretation.    COORDINATION OF CARE: 9:25 PM-Discussed treatment plan with pt at bedside and pt agreed to plan.     Labs Review Labs Reviewed - No data to display  Imaging Review No results found.   EKG Interpretation None      MDM   Final diagnoses:  None    1. Cough  DDx: pertussis (patient immunized) vs ACE inhibitor contributory cough (had URI symptoms prior to cough - doubt cause) vs bronchospasmodic cough. Will Rx taper dose steroids as this provided some relief - will do 12 days - and Zithromax treating cough of longer than 2 weeks duration, which also provides pertussis coverage. Patient has PCP follow up in community and will go this week for recheck.   I personally performed the services described in this documentation, which was scribed in my presence. The recorded information has been reviewed and is accurate.    Dewaine Oats, PA-C 07/01/14 [place], MD 07/05/14 [phone]

## 2014-07-17 ENCOUNTER — Other Ambulatory Visit: Payer: Self-pay | Admitting: Internal Medicine

## 2014-07-23 ENCOUNTER — Ambulatory Visit: Payer: BC Managed Care – PPO | Admitting: Gastroenterology

## 2014-07-25 ENCOUNTER — Ambulatory Visit (INDEPENDENT_AMBULATORY_CARE_PROVIDER_SITE_OTHER): Payer: BC Managed Care – PPO | Admitting: Family Medicine

## 2014-07-25 ENCOUNTER — Encounter: Payer: Self-pay | Admitting: Family Medicine

## 2014-07-25 VITALS — BP 102/80 | HR 87 | Temp 98.4°F | Ht 65.5 in | Wt 211.1 lb

## 2014-07-25 DIAGNOSIS — J069 Acute upper respiratory infection, unspecified: Secondary | ICD-10-CM

## 2014-07-25 MED ORDER — BENZONATATE 100 MG PO CAPS: 100.0000 mg | ORAL_CAPSULE | Freq: Two times a day (BID) | ORAL | Status: DC | PRN

## 2014-07-25 MED ORDER — HYDROCODONE-HOMATROPINE 5-1.5 MG/5ML PO SYRP: 5.0000 mL | ORAL_SOLUTION | Freq: Every evening | ORAL | Status: DC | PRN

## 2014-07-25 NOTE — Progress Notes (Signed)
HPI:    URI: -started: 4 days ago -symptoms:nasal congestion, drainage, cough, ? 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  Past Medical History  Diagnosis Date  . Allergy   . Hypertension   . IBS (irritable bowel syndrome)   . Hyperlipemia   . Anxiety   . GERD (gastroesophageal reflux disease)   . Chronic constipation   . Thyroid disease   . Nephropathy   . Kidney disease     Past Surgical History  Procedure Laterality Date  . Abdominal hysterectomy  1998  . Cholecystectomy  1997  . Esophagogastroduodenoscopy    . Tubal ligation    . Thyroid surgery      Family History  Problem Relation Age of Onset  . Hypertension Father   . Coronary artery disease Father     History   Social History  . Marital Status: Divorced    Spouse Name: N/A    Number of Children: 2  . Years of Education: N/A   Occupational History  . NURSING ASSISTANT    Social History Main Topics  . Smoking status: Never Smoker   . Smokeless tobacco: Never Used  . Alcohol Use: No  . Drug Use: No  . Sexual Activity: Yes    Birth Control/ Protection: Surgical   Other Topics Concern  . None   Social History Narrative    Current outpatient prescriptions: amLODipine (NORVASC) 5 MG tablet, TAKE 1 TABLET BY MOUTH EVERY DAY, Disp: 30 tablet, Rfl: 11;  docusate sodium (COLACE)  100 MG capsule, Take 100 mg by mouth daily. , Disp: , Rfl: ;  levothyroxine (SYNTHROID, LEVOTHROID) 150 MCG tablet, TAKE 1 TABLET BY MOUTH EVERY DAY BEFORE BREAKFAST, Disp: 30 tablet, Rfl: 5;  loratadine (CLARITIN) 10 MG tablet, Take 10 mg by mouth daily., Disp: , Rfl:  metoprolol succinate (TOPROL-XL) 50 MG 24 hr tablet, TAKE 1 TABLET BY MOUTH EVERY DAY. TAKE IMMEDIATELY FOLLOWING A MEAL, Disp: 30 tablet, Rfl: 11;  Multiple Vitamin (MULTIVITAMIN) tablet, Take 1 tablet by mouth daily.  , Disp: , Rfl: ;  omeprazole (PRILOSEC) 40 MG capsule, Take 40 mg by mouth daily., Disp: , Rfl:  promethazine (PHENERGAN) 25 MG tablet, Take 1 tablet (25 mg total) by mouth every 6 (six) hours as needed for nausea or vomiting., Disp: 30 tablet, Rfl: 2;  valsartan (DIOVAN) 160 MG tablet, TAKE 1 TABLET BY MOUTH EVERY DAY, Disp: 30 tablet, Rfl: 5;  benzonatate (TESSALON) 100 MG capsule, Take 1 capsule (100 mg total) by mouth 2 (two) times daily as needed for cough., Disp: 20 capsule, Rfl: 0 HYDROcodone-homatropine (HYCODAN) 5-1.5 MG/5ML syrup, Take 5 mLs by mouth at bedtime as needed for cough., Disp: 120 mL, Rfl: 0  EXAM:  Filed Vitals:   07/25/14 1058  BP: 102/80  Pulse: 87  Temp: 98.4  F (36.9 C)    Body mass index is 34.58 kg/(m^2).  GENERAL: vitals reviewed and listed above, alert, oriented, appears well hydrated and in no acute distress  HEENT: atraumatic, conjunttiva clear, no obvious abnormalities on inspection of external nose and ears, normal appearance of ear canals and TMs, clear nasal congestion, mild post oropharyngeal erythema with PND, no tonsillar edema or exudate, no sinus TTP  NECK: no obvious masses on inspection  LUNGS: clear to auscultation bilaterally, no wheezes, rales or rhonchi, good air movement  CV: HRRR, no peripheral edema  MS: moves all extremities without noticeable abnormality  PSYCH: pleasant and cooperative, no obvious depression or anxiety  ASSESSMENT AND  PLAN:  Discussed the following assessment and plan:  Acute upper respiratory infection - Plan: benzonatate (TESSALON) 100 MG capsule  -given HPI and exam findings today, a serious infection or illness is unlikely. We discussed potential etiologies, with VURI being most likely, and advised supportive care and monitoring. We discussed treatment side effects, likely course, antibiotic misuse, transmission, and signs of developing a serious illness. -want hycodan and assures me PCP gives this to her and NOT allergic, discussed risks and advised cautions use only as needed  -she wants to try tessalon perles during day -add INS to allergy regimen -reports she will schedule routine follow up with PCP, if persistent cough consider PFTs to see if mild underlying cough variant asthma -of course, we advised to return or notify a doctor immediately if symptoms worsen or persist or new concerns arise.    Patient Instructions  INSTRUCTIONS FOR UPPER RESPIRATORY INFECTION:  -plenty of rest and fluids  -nasal saline wash 2-3 times daily (use prepackaged nasal saline or bottled/distilled water if making your own)   -flonase daily for 21 days and during allergy flares  -can use AFRIN nasal spray for drainage and nasal congestion - but do NOT use longer then 3-4 days  -can use tylenol or ibuprofen as directed for aches and sorethroat  -in the winter time, using a humidifier at night is helpful (please follow cleaning instructions)  -if you are taking a cough medication - use only as directed, may also try a teaspoon of honey to coat the throat and throat lozenges  -for sore throat, salt water gargles can help  -follow up if you have fevers, facial pain, tooth pain, difficulty breathing or are worsening or not getting better in 5-7 days      KIM, HANNAH R.

## 2014-07-25 NOTE — Progress Notes (Signed)
Pre visit review using our clinic review tool, if applicable. No additional management support is needed unless otherwise documented below in the visit note. 

## 2014-07-25 NOTE — Patient Instructions (Signed)
INSTRUCTIONS FOR UPPER RESPIRATORY INFECTION:  -plenty of rest and fluids  -nasal saline wash 2-3 times daily (use prepackaged nasal saline or bottled/distilled water if making your own)   -flonase daily for 21 days and during allergy flares  -can use AFRIN nasal spray for drainage and nasal congestion - but do NOT use longer then 3-4 days  -can use tylenol or ibuprofen as directed for aches and sorethroat  -in the winter time, using a humidifier at night is helpful (please follow cleaning instructions)  -if you are taking a cough medication - use only as directed, may also try a teaspoon of honey to coat the throat and throat lozenges  -for sore throat, salt water gargles can help  -follow up if you have fevers, facial pain, tooth pain, difficulty breathing or are worsening or not getting better in 5-7 days

## 2014-07-30 ENCOUNTER — Emergency Department (HOSPITAL_COMMUNITY)
Admission: EM | Admit: 2014-07-30 | Discharge: 2014-07-30 | Disposition: A | Discharge status: Home / Self Care | Payer: BC Managed Care – PPO | Attending: Emergency Medicine | Admitting: Emergency Medicine

## 2014-07-30 ENCOUNTER — Encounter (HOSPITAL_COMMUNITY): Payer: Self-pay | Admitting: Emergency Medicine

## 2014-07-30 ENCOUNTER — Emergency Department (HOSPITAL_COMMUNITY): Payer: BC Managed Care – PPO

## 2014-07-30 DIAGNOSIS — E079 Disorder of thyroid, unspecified: Secondary | ICD-10-CM | POA: Insufficient documentation

## 2014-07-30 DIAGNOSIS — I10 Essential (primary) hypertension: Secondary | ICD-10-CM | POA: Diagnosis not present

## 2014-07-30 DIAGNOSIS — Z8659 Personal history of other mental and behavioral disorders: Secondary | ICD-10-CM | POA: Insufficient documentation

## 2014-07-30 DIAGNOSIS — R0602 Shortness of breath: Secondary | ICD-10-CM | POA: Insufficient documentation

## 2014-07-30 DIAGNOSIS — K219 Gastro-esophageal reflux disease without esophagitis: Secondary | ICD-10-CM | POA: Diagnosis not present

## 2014-07-30 DIAGNOSIS — R05 Cough: Secondary | ICD-10-CM | POA: Insufficient documentation

## 2014-07-30 DIAGNOSIS — R053 Chronic cough: Secondary | ICD-10-CM

## 2014-07-30 DIAGNOSIS — Z79899 Other long term (current) drug therapy: Secondary | ICD-10-CM | POA: Insufficient documentation

## 2014-07-30 DIAGNOSIS — Z87448 Personal history of other diseases of urinary system: Secondary | ICD-10-CM | POA: Insufficient documentation

## 2014-07-30 MED ORDER — OMEPRAZOLE 20 MG PO CPDR: 20.0000 mg | DELAYED_RELEASE_CAPSULE | Freq: Every day | ORAL | Status: DC

## 2014-07-30 NOTE — Discharge Instructions (Signed)
It is possibly your symptoms today may be secondary to Diovan. Recommend you follow up with your primary care doctor to discuss discontinuation of this medication. It is also possible that your symptoms may be secondary to reflux. Take Prilosec as prescribed for this if you're not currently taking this medication already. Recommend you follow up with pulmonology, Dr. Elsworth Soho, as well for further evaluation of symptoms.  Cough, Adult  A cough is a reflex that helps clear your throat and airways. It can help heal the body or may be a reaction to an irritated airway. A cough may only last 2 or 3 weeks (acute) or may last more than 8 weeks (chronic).  CAUSES Acute cough:  Viral or bacterial infections. Chronic cough:  Infections.  Allergies.  Asthma.  Post-nasal drip.  Smoking.  Heartburn or acid reflux.  Some medicines.  Chronic lung problems (COPD).  Cancer. SYMPTOMS   Cough.  Fever.  Chest pain.  Increased breathing rate.  High-pitched whistling sound when breathing (wheezing).  Colored mucus that you cough up (sputum). TREATMENT   A bacterial cough may be treated with antibiotic medicine.  A viral cough must run its course and will not respond to antibiotics.  Your caregiver may recommend other treatments if you have a chronic cough. HOME CARE INSTRUCTIONS   Only take over-the-counter or prescription medicines for pain, discomfort, or fever as directed by your caregiver. Use cough suppressants only as directed by your caregiver.  Use a cold steam vaporizer or humidifier in your bedroom or home to help loosen secretions.  Sleep in a semi-upright position if your cough is worse at night.  Rest as needed.  Stop smoking if you smoke. SEEK IMMEDIATE MEDICAL CARE IF:   You have pus in your sputum.  Your cough starts to worsen.  You cannot control your cough with suppressants and are losing sleep.  You begin coughing up blood.  You have difficulty  breathing.  You develop pain which is getting worse or is uncontrolled with medicine.  You have a fever. MAKE SURE YOU:   Understand these instructions.  Will watch your condition.  Will get help right away if you are not doing well or get worse. Document Released: 01/30/2011 Document Revised: 10/26/2011 Document Reviewed: 01/30/2011 [facility] Patient Information 2015 [place], [place]. This information is not intended to replace advice given to you by your health care provider. Make sure you discuss any questions you have with your health care provider.

## 2014-07-30 NOTE — ED Notes (Signed)
Pt arrived to the ED with a complaint of a cough that has been present for two months.  Pt states she has seen her primary doctor but the medications prescribed have been ineffective.  Pt states that in the last 24 hours the cough has become persistent and continuous which has given the patient shortness of breath and a headache

## 2014-07-30 NOTE — ED Provider Notes (Signed)
CSN: [id number]     Arrival date & time 07/30/14  0205 History   First MD Initiated Contact with Patient 07/30/14 0249     Chief Complaint  Patient presents with  . Cough  . Shortness of Breath    (Consider location/radiation/quality/duration/timing/severity/associated sxs/prior Treatment) HPI Comments: 43 year old female with a history of hypertension, IBS, esophageal reflux, constipation, and kidney disease presents to the emergency department for further evaluation of a chronic cough. Patient states the cough has been present 2 months. She states that it eased up a bit 2 weeks ago, but worsened again 8 days ago. Patient has tried breathing treatments, prednisone, honey, lemon tea, azithromycin, Hycodan, and Flonase for symptoms without improvement. She states that her cough is dry and intermittent. No associated fever, syncope or near-syncope, vomiting, hemoptysis, chest pain, nasal congestion, or rhinorrhea. Patient denies any smoking history. She is on valsartan, but states that she has been on this for a while.  Patient is a 43 y.o. female presenting with cough and shortness of breath. The history is provided by the patient. No language interpreter was used.  Cough Associated symptoms: shortness of breath   Shortness of Breath Associated symptoms: cough     Past Medical History  Diagnosis Date  . Allergy   . Hypertension   . IBS (irritable bowel syndrome)   . Hyperlipemia   . Anxiety   . GERD (gastroesophageal reflux disease)   . Chronic constipation   . Thyroid disease   . Nephropathy   . Kidney disease    Past Surgical History  Procedure Laterality Date  . Abdominal hysterectomy  1998  . Cholecystectomy  1997  . Esophagogastroduodenoscopy    . Tubal ligation    . Thyroid surgery     Family History  Problem Relation Age of Onset  . Hypertension Father   . Coronary artery disease Father    History  Substance Use Topics  . Smoking status: Never Smoker   .  Smokeless tobacco: Never Used  . Alcohol Use: No   OB History    No data available      Review of Systems  Respiratory: Positive for cough and shortness of breath.   All other systems reviewed and are negative.   Allergies  Aspirin; Dilaudid; Food; and Tramadol  Home Medications   Prior to Admission medications   Medication Sig Start Date End Date Taking? Authorizing Provider  amLODipine (NORVASC) 5 MG tablet Take 5 mg by mouth daily.   Yes Historical Provider, MD  docusate sodium (COLACE) 100 MG capsule Take 100 mg by mouth daily.    Yes Historical Provider, MD  HYDROcodone-homatropine (HYCODAN) 5-1.5 MG/5ML syrup Take 5 mLs by mouth at bedtime as needed for cough. 07/25/14  Yes Lucretia Kern, DO  levothyroxine (SYNTHROID, LEVOTHROID) 150 MCG tablet Take 150 mcg by mouth daily before breakfast.   Yes Historical Provider, MD  loratadine (CLARITIN) 10 MG tablet Take 10 mg by mouth daily.   Yes Historical Provider, MD  metoprolol succinate (TOPROL-XL) 50 MG 24 hr tablet Take 50 mg by mouth daily. Take with or immediately following a meal.   Yes Historical Provider, MD  Multiple Vitamin (MULTIVITAMIN) tablet Take 1 tablet by mouth daily.     Yes Historical Provider, MD  promethazine (PHENERGAN) 25 MG tablet Take 1 tablet (25 mg total) by mouth every 6 (six) hours as needed for nausea or vomiting. 12/04/13  Yes Marletta Lor, MD  valsartan (DIOVAN) 160 MG tablet Take 160  mg by mouth daily.   Yes Historical Provider, MD  amLODipine (NORVASC) 5 MG tablet TAKE 1 TABLET BY MOUTH EVERY DAY Patient not taking: Reported on 07/30/2014 06/28/14   Marletta Lor, MD  benzonatate (TESSALON) 100 MG capsule Take 1 capsule (100 mg total) by mouth 2 (two) times daily as needed for cough. Patient not taking: Reported on 07/30/2014 07/25/14   Lucretia Kern, DO  levothyroxine (SYNTHROID, LEVOTHROID) 150 MCG tablet TAKE 1 TABLET BY MOUTH EVERY DAY BEFORE BREAKFAST Patient not taking: Reported on  07/30/2014 03/02/14   Marletta Lor, MD  metoprolol succinate (TOPROL-XL) 50 MG 24 hr tablet TAKE 1 TABLET BY MOUTH EVERY DAY. TAKE IMMEDIATELY FOLLOWING A MEAL Patient not taking: Reported on 07/30/2014 06/28/14   Marletta Lor, MD  omeprazole (PRILOSEC) 20 MG capsule Take 1 capsule (20 mg total) by mouth daily. 07/30/14   Antonietta Breach, PA-C  valsartan (DIOVAN) 160 MG tablet TAKE 1 TABLET BY MOUTH EVERY DAY Patient not taking: Reported on 07/30/2014 03/02/14   Marletta Lor, MD   BP 117/71 mmHg  Pulse 91  Temp(Src) 98.5 F (36.9 C) (Oral)  Resp 18  SpO2 93%   Physical Exam  Constitutional: She is oriented to person, place, and time. She appears well-developed and well-nourished. No distress.  Nontoxic/nonseptic appearing  HENT:  Head: Normocephalic and atraumatic.  Eyes: Conjunctivae and EOM are normal. No scleral icterus.  Neck: Normal range of motion.  Cardiovascular: Normal rate, regular rhythm and normal heart sounds.   Pulmonary/Chest: Effort normal and breath sounds normal. No respiratory distress. She has no wheezes. She has no rales.  Mild adventitious sounds appreciated in the left posterior lower lung field. Respirations even and unlabored. Dry, nonproductive cough appreciated sporadically at bedside.  Musculoskeletal: Normal range of motion.  Neurological: She is alert and oriented to person, place, and time. She exhibits normal muscle tone. Coordination normal.  GCS 15. Patient speaks in full goal oriented sentences. Patient moves extremities without ataxia.  Skin: Skin is warm and dry. No rash noted. She is not diaphoretic. No erythema. No pallor.  Psychiatric: She has a normal mood and affect. Her behavior is normal.  Nursing note and vitals reviewed.   ED Course  Procedures (including critical care time) Labs Review Labs Reviewed - No data to display  Imaging Review Dg Chest 2 View (if Patient Has Fever And/or Copd)  07/30/2014   CLINICAL DATA:   Cough and shortness of breath  EXAM: CHEST  2 VIEW  COMPARISON:  06/14/2014  FINDINGS: Normal heart size and mediastinal contours. No acute infiltrate or edema. No effusion or pneumothorax.  Thyroidectomy changes.  Left cervical rib.  IMPRESSION: No active cardiopulmonary disease.   Electronically Signed   By: Jorje Guild M.D.   On: 07/30/2014 03:39     EKG Interpretation None       MDM   Final diagnoses:  Chronic cough    43 year old female presents to the emergency department for further evaluation of a cough 2 months. Patient has had multiple CT PE studies in the past to evaluate cough, all of which have been negative. Patient has no tachycardia, tachypnea, dyspnea, or hypoxia in ED today. No change in symptoms since onset and patient has tried multiple over-the-counter and prescription regimens without relief. No evidence of focal consolidation or pneumonia today. Suspect that symptoms may be secondary to Valsartan use. GERD less likely as patient has had fairly good control of this with daily Prilosec, though  this remains on the differential. We will also refer patient to pulmonology for further evaluation of her symptoms. Do not believe further emergent workup is indicated at this time. Return precautions discussed and provided. Patient agreeable to plan with no unaddressed concerns.   Filed Vitals:   07/30/14 0210 07/30/14 0400  BP: 128/85 117/71  Pulse: 90 91  Temp: 98.6 F (37 C) 98.5 F (36.9 C)  TempSrc: Oral Oral  Resp: 20 18  SpO2: 96% 93%     Antonietta Breach, PA-C 07/30/14 [place], MD 07/30/14 [phone]

## 2014-08-01 ENCOUNTER — Ambulatory Visit (INDEPENDENT_AMBULATORY_CARE_PROVIDER_SITE_OTHER): Payer: BC Managed Care – PPO | Admitting: Internal Medicine

## 2014-08-01 ENCOUNTER — Encounter: Payer: Self-pay | Admitting: Internal Medicine

## 2014-08-01 VITALS — BP 106/84 | HR 91 | Ht 67.0 in | Wt 214.0 lb

## 2014-08-01 DIAGNOSIS — R05 Cough: Secondary | ICD-10-CM

## 2014-08-01 DIAGNOSIS — R053 Chronic cough: Secondary | ICD-10-CM

## 2014-08-01 DIAGNOSIS — Z86711 Personal history of pulmonary embolism: Secondary | ICD-10-CM

## 2014-08-01 MED ORDER — GABAPENTIN 300 MG PO CAPS: ORAL_CAPSULE | ORAL | Status: DC

## 2014-08-01 MED ORDER — FLUTICASONE PROPIONATE 50 MCG/ACT NA SUSP: 2.0000 | Freq: Every day | NASAL | Status: DC

## 2014-08-01 NOTE — Progress Notes (Signed)
Subjective:    Patient ID: Rhonda [name], female    DOB: 1970-10-20, 43 y.o.   MRN: [id number]  PCP Nyoka Cowden, MD   HPI  IOV 08/01/2014  Chief Complaint  Patient presents with  . Pulmonary Consult    Pt stated she was referred by [facility] doc for chronic cough with yellow and clear mucus X 2 months.    43 year old female referred for chronic cough. 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 In fact after her July 2008 CT scan of the chest that showed pulmonary embolism she's had a dozen CT scans of the chest leading up to October 2015 that all have ruled out pulmonary embolism with a d-dimer has been positive each time it has been tested. These are documented below. She only took 6 months of anticoagulation back in July 2008 after the diagnosis  Other than this 2 months ago she developed a upper respiratory infection and since then has had severe chronic cough that is persistent without any improvement. Cough is worse particularly when she lies down and at night. The quality is dry. It is also made worse by laughing but not by talking. There is no irritation triggers for the cough. There is no specific relieving factors. There is a barking or laryngeal quality to the cough as well. She feels an associated tickle in her throat and occasionally gags. There is associated wheezing but no shortness of breath.  Cough associated risk factors  - Hypertension: She is not on an ACE inhibitor - Acid reflux disease: She has acid reflux for which she takes Prilosec but her dietary habits are poor - Sinus drainage: She continues to have sinus drainage she is on constant Zyrtec for this - asthma: denies but she has wheeze. She took  prednisone few weeks ago for 12 days but did not help -neurogenic issues - she works as [employer] and has to talk a lot but denies this makes cough worse. dEnies voice rest makes cough better   PE  ruled out on CTA - all below except as indicated 03/09/2007 - RUL PE +  , ddimer HIGH 8/.05/2007 - clear, NO ddimer 07/28/2007 - clear, HIGH Ddimer 12/22/2007 - clear - no ddimner 03/28/2009 - clear, d-dimer , HIGH DDIMER 08/15/2009 - c;lear, HIGH DdDIMER 01/29/2010  - clear, HIGH DDIMER 04/27/10 - had some infitlrates - no ddimer 06/07/11 - mild atx at base - no ddimer 10/27/11 - mild atx at base, HIGH DDINMER 7..27/13  - clear - has C7 left cervical rib - No ddimer 02/26/14 - clear - no ddimer 06/14/14 - clear, HIGH DDIMER     has a past medical history of Allergy; Hypertension; IBS (irritable bowel syndrome); Hyperlipemia; Anxiety; GERD (gastroesophageal reflux disease); Chronic constipation; Thyroid disease; Nephropathy; and Kidney disease.   reports that she has never smoked. She has never used smokeless tobacco.  Past Surgical History  Procedure Laterality Date  . Abdominal hysterectomy  1998  . Cholecystectomy  1997  . Esophagogastroduodenoscopy    . Tubal ligation    . Thyroid surgery      Allergies  Allergen Reactions  . Aspirin Hives  . Dilaudid [Hydromorphone] Itching  . Food Other (See Comments)    Annitta [place]  beans  . Tramadol Other (See Comments)    Headache     Immunization History  Administered Date(s) Administered  . Influenza Split 05/29/2011, 04/28/2012, 05/17/2013  . Influenza Whole 05/14/2008, 06/26/2009, 05/20/2010  . Influenza-Unspecified 05/11/2014  . Tdap 06/07/2014    Family History  Problem Relation Age of Onset  . Hypertension Father   . Coronary artery disease Father     Current outpatient prescriptions: albuterol (PROVENTIL HFA;VENTOLIN HFA) 108 (90 BASE) MCG/ACT inhaler, Inhale 2 puffs into the lungs every 6 (six) hours as needed for wheezing or  shortness of breath., Disp: , Rfl: ;  amLODipine (NORVASC) 5 MG tablet, TAKE 1 TABLET BY MOUTH EVERY DAY, Disp: 30 tablet, Rfl: 11;  cetirizine (ZYRTEC) 10 MG tablet, Take 10 mg by mouth daily., Disp: , Rfl:  docusate sodium (COLACE) 100 MG capsule, Take 100 mg by mouth daily. , Disp: , Rfl: ;  HYDROcodone-homatropine (HYCODAN) 5-1.5 MG/5ML syrup, Take 5 mLs by mouth at bedtime as needed for cough., Disp: 120 mL, Rfl: 0;  levothyroxine (SYNTHROID, LEVOTHROID) 150 MCG tablet, TAKE 1 TABLET BY MOUTH EVERY DAY BEFORE BREAKFAST, Disp: 30 tablet, Rfl: 5 metoprolol succinate (TOPROL-XL) 50 MG 24 hr tablet, TAKE 1 TABLET BY MOUTH EVERY DAY. TAKE IMMEDIATELY FOLLOWING A MEAL, Disp: 30 tablet, Rfl: 11;  Multiple Vitamin (MULTIVITAMIN) tablet, Take 1 tablet by mouth daily.  , Disp: , Rfl: ;  omeprazole (PRILOSEC) 20 MG capsule, Take 1 capsule (20 mg total) by mouth daily., Disp: 30 capsule, Rfl: 0;  OVER THE COUNTER MEDICATION, Cough suppressant and expectorant OTC, Disp: , Rfl:  promethazine (PHENERGAN) 25 MG tablet, Take 1 tablet (25 mg total) by mouth every 6 (six) hours as needed for nausea or vomiting., Disp: 30 tablet, Rfl: 2;  valsartan (DIOVAN) 160 MG tablet, TAKE 1 TABLET BY MOUTH EVERY DAY, Disp: 30 tablet, Rfl: 5;  benzonatate (TESSALON) 100 MG capsule, Take 1 capsule (100 mg total) by mouth 2 (two) times daily as needed for cough. (Patient not taking: Reported on 07/30/2014), Disp: 20 capsule, Rfl: 0    Review of Systems  Constitutional: Negative for fever and unexpected weight change.  HENT: Positive for congestion and rhinorrhea. Negative for dental problem, ear pain, nosebleeds, postnasal drip, sinus pressure, sneezing, sore throat and trouble swallowing.   Eyes: Negative for redness and itching.  Respiratory: Positive for cough, shortness of breath and wheezing. Negative for chest tightness.   Cardiovascular: Positive for chest pain. Negative for palpitations and leg swelling.  Gastrointestinal:  Positive for nausea. Negative for vomiting.  Genitourinary: Negative for dysuria.  Musculoskeletal: Negative for joint swelling.  Skin: Negative for rash.  Neurological: Negative for headaches.  Hematological: Does not bruise/bleed easily.  Psychiatric/Behavioral: Negative for dysphoric mood. The patient is not nervous/anxious.        Objective:   Physical Exam  Constitutional: She is oriented to person, place, and time. She appears well-developed and well-nourished. No distress.  Body mass index is 33.51 kg/(m^2).   HENT:  Head: Normocephalic and atraumatic.  Right Ear: External ear normal.  Left Ear: External ear normal.  Mouth/Throat: Oropharynx is clear and moist. No oropharyngeal exudate.  Eyes: Conjunctivae and EOM are normal. Pupils are equal, round, and reactive to light. Right eye exhibits no discharge. Left eye exhibits no discharge. No scleral icterus.  Neck: Normal range of motion. Neck supple. No JVD present. No tracheal deviation present. No thyromegaly present.  Cardiovascular: Normal rate, regular rhythm, normal heart sounds and intact distal pulses.  Exam reveals no gallop and no friction rub.   No murmur heard. Pulmonary/Chest: Effort normal and breath sounds normal. No respiratory distress. She has no wheezes. She has no rales. She exhibits no tenderness.  Laryngeal cough + Transmitted upper airway noise +   Abdominal: Soft. Bowel sounds are normal. She exhibits no distension and no mass. There is no tenderness. There is no rebound and no guarding.  Musculoskeletal: Normal range of motion. She exhibits no edema or tenderness.  Lymphadenopathy:    She has no cervical adenopathy.  Neurological: She is alert and oriented to person, place, and time. She has normal reflexes. No cranial nerve deficit. She exhibits normal muscle tone. Coordination normal.  Skin: Skin is warm and dry. No rash noted. She is not diaphoretic. No erythema. No pallor.  Psychiatric: She has a  normal mood and affect. Her behavior is normal. Judgment and thought content normal.  Vitals reviewed.   Filed Vitals:   08/01/14 1543  BP: 106/84  Pulse: 91  Height: 5\' 7"  (1.702 m)  Weight: 214 lb (97.07 kg)  SpO2: 95%         Assessment & Plan:  #HIstory of Pulmonary Embolism and permanently high d-dimer  - we need to find a way to prevent you from having CT scan for PE every 6 months - you might just need to be on low dose anticoagulation due to persistent high d-dimer which poses risk for future clot - refer Dr Murriel Hopper in [facility]   #CHRONIC COUGH Cough is from sinus drainage, acid reflux, possible asthma,  All of this is working together to cause cyclical cough/LPR cough or neurogenic cough   #Sinus drainage  - continue zytec daily - start nasal steroid generic fluticasone inhaler 2 squirts each nostril daily as advised  - do CT scan sinus  #Possible Acid Reflux  - tcontinue prilosec but increase dose to 40mg  daily -   1 capsule daily on empty stomach  - start OTC ranitidine 300mg  daily at night   - take low glycemic diet sheet from [place]   - eat foods in left lane only  - avoid colas, spices, cheeses, spirits, red meats, beer, chocolates, fried foods etc.,   - sleep with head end of bed elevated  - eat small frequent meals  - do not go to bed for 3 hours after last meal  #Possible Asthma  - do methacholine challenge test (you cannot be pregnant or have heart disease for this test)  - will hold off on inhalers till this is complete  #Neurogenic cough  - please choose 2-3 days and observe complete voice rest - no talking or whispering  - at all times there  there is urge to cough, drink water or swallow or sip on throat lozenge -- Take gabapentin 300mg  once daily x 3 days, then 300mg  twice daily x 3 days, then 300mg  three times daily to continue. If this makes you too sleepy or drowsy call us and we will cut your medication dosing down - refer  speech rehab with Mr Garald Balding    #Followup - Myself or Tammy PArrett  will see you in 4-6 weeks.  - any problems call or come sooner   Dr. Brand Males, M.D., [facility].C.P Pulmonary and Critical Care Medicine Staff Physician [place] [facility] Pager: [phone], If no answer or between  15:00h - 7:00h: call [phone]  08/01/2014 6:59 PM

## 2014-08-01 NOTE — Patient Instructions (Addendum)
#  HIstory of Pulmonary Embolism and permanently high d-dimer  - we need to find a way to prevent you from having CT scan for PE every 6 months - you might just need to be on low dose anticoagulation due to persistent high d-dimer which poses risk for future clot - refer Dr Murriel Hopper in [facility]   #CHRONIC COUGH Cough is from sinus drainage, acid reflux, possible asthma,  All of this is working together to cause cyclical cough/LPR cough or neurogenic cough   #Sinus drainage  - continue zytec daily - start nasal steroid generic fluticasone inhaler 2 squirts each nostril daily as advised  - do CT scan sinus  #Possible Acid Reflux  - tcontinue prilosec but increase dose to 40mg  daily -   1 capsule daily on empty stomach  - start OTC ranitidine 300mg  daily at night   - take low glycemic diet sheet from [place]   - eat foods in left lane only  - avoid colas, spices, cheeses, spirits, red meats, beer, chocolates, fried foods etc.,   - sleep with head end of bed elevated  - eat small frequent meals  - do not go to bed for 3 hours after last meal  #Possible Asthma  - do methacholine challenge test (you cannot be pregnant or have heart disease for this test)  - will hold off on inhalers till this is complete  #Neurogenic cough  - please choose 2-3 days and observe complete voice rest - no talking or whispering  - at all times there  there is urge to cough, drink water or swallow or sip on throat lozenge -- Take gabapentin 300mg  once daily x 3 days, then 300mg  twice daily x 3 days, then 300mg  three times daily to continue. If this makes you too sleepy or drowsy call us and we will cut your medication dosing down - refer speech rehab with Mr Garald Balding    #Followup - Myself or Tammy PArrett  will see you in 4-6 weeks.  - any problems call or come sooner

## 2014-08-02 NOTE — Addendum Note (Signed)
Addended by: Maurice March on: 08/02/2014 02:22 PM   Modules accepted: Orders

## 2014-08-03 ENCOUNTER — Ambulatory Visit (INDEPENDENT_AMBULATORY_CARE_PROVIDER_SITE_OTHER)
Admission: RE | Admit: 2014-08-03 | Discharge: 2014-08-03 | Disposition: A | Discharge status: Home / Self Care | Payer: BC Managed Care – PPO | Source: Ambulatory Visit | Attending: Internal Medicine | Admitting: Internal Medicine

## 2014-08-03 ENCOUNTER — Ambulatory Visit: Payer: BC Managed Care – PPO | Admitting: Gastroenterology

## 2014-08-03 ENCOUNTER — Telehealth: Payer: Self-pay | Admitting: Internal Medicine

## 2014-08-03 DIAGNOSIS — R05 Cough: Secondary | ICD-10-CM

## 2014-08-03 DIAGNOSIS — R053 Chronic cough: Secondary | ICD-10-CM

## 2014-08-03 NOTE — Telephone Encounter (Signed)
Pt wanted to confirm she will  Be on gabapentin until she comes back to be seen. Nothing further needed

## 2014-08-06 ENCOUNTER — Telehealth: Payer: Self-pay | Admitting: Internal Medicine

## 2014-08-06 NOTE — Telephone Encounter (Signed)
Spoke with the pt  She is asking about referral to Dr Beryle Beams  She states that she already has PCP and unsure why he wants to refer to another MD for internal medicine  Please advise thanks!

## 2014-08-07 ENCOUNTER — Other Ambulatory Visit: Payer: Self-pay | Admitting: Internal Medicine

## 2014-08-07 DIAGNOSIS — R053 Chronic cough: Secondary | ICD-10-CM

## 2014-08-07 DIAGNOSIS — R05 Cough: Secondary | ICD-10-CM

## 2014-08-07 NOTE — Progress Notes (Signed)
Quick Note:  Called and spoke to pt. Informed pt of the results and recs per MR. Referral order placed. Pt verbalized understanding and denied any further questions or concerns at this time. ______

## 2014-08-11 NOTE — Telephone Encounter (Signed)
I already explained to her but I can understand her confusion. Please mention this  Dr Beryle Beams though is in internal medicine and same department as her pcp  Nyoka Cowden, MD. DR Donavan Burnet is a blood and clotting specialist even though in same department. Rhonda [name] had one blood clot and has been to ER >1 2 times with CT chest > 12 times since 2008 because D-dimer is always high. I am trying to come up with a way she is not subject to so much repeat CT chest and radiation exposure if at all possible. Also, high d-dimer poses clotting risk for future and maybe she needs to be on a low dose blood thinner. Only Dr Beryle Beams can answer these questions: so my referral to him  You can read out exactly  Thanks  Dr. Brand Males, M.D., [facility].C.P Pulmonary and Critical Care Medicine Staff Physician [place] [facility] Pager: [phone], If no answer or between  15:00h - 7:00h: call [phone]  08/11/2014 1:23 PM

## 2014-08-13 NOTE — Telephone Encounter (Signed)
Pt states that she was supposed to set up an appt with Dr Beryle Beams (hematologist) - Pt states that she was called about 1 week ago to set up an appt but refused bc she did not know what was going on. Pt states that they advised her that once she got everything figured out and our office was ready to send her over there, they would need another referral placed. Will send to [facility] to see if this appt can be scheduled off the order that is already in the computer 08/01/14. Please advise. Thanks.

## 2014-08-13 NOTE — Telephone Encounter (Signed)
Called [phone] and spoke with Arman Bogus at Dr. Azucena Freed office. Advised Tiffany that when schedulers attempted to schedule with Dr. Beryle Beams, patient refused the appointment, thinking that Dr. Beryle Beams was a [place] physician and she didn't have [place].  Explained the above to [place] and per Tiffany they will reach out to patient again, to schedule this appointment.  Called patient to advise that Dr. Azucena Freed office will be calling her back to r/s this appointment, however, did not receive an answer. Left message on voice mail of the above. Advised patient that if she had any questions, to return my call at [phone]. Per Tiffany at [facility], they will contact patient again to schedule. Rhonda J Cobb

## 2014-08-13 NOTE — Telephone Encounter (Signed)
lmtcb X1 for pt  

## 2014-08-14 ENCOUNTER — Ambulatory Visit (HOSPITAL_COMMUNITY)
Admission: RE | Admit: 2014-08-14 | Discharge: 2014-08-14 | Disposition: A | Discharge status: Home / Self Care | Payer: BC Managed Care – PPO | Source: Ambulatory Visit | Attending: Internal Medicine | Admitting: Internal Medicine

## 2014-08-14 DIAGNOSIS — R053 Chronic cough: Secondary | ICD-10-CM

## 2014-08-14 DIAGNOSIS — R05 Cough: Secondary | ICD-10-CM | POA: Insufficient documentation

## 2014-08-14 LAB — PULMONARY FUNCTION TEST
FEF 25-75 POST: 1.62 L/s
FEF 25-75 PRE: 1.69 L/s
FEF2575-%Change-Post: -3 %
FEF2575-%PRED-PRE: 59 %
FEF2575-%Pred-Post: 57 %
FEV1-%Change-Post: 2 %
FEV1-%PRED-PRE: 75 %
FEV1-%Pred-Post: 76 %
FEV1-POST: 2.07 L
FEV1-PRE: 2.03 L
FEV1FVC-%CHANGE-POST: 7 %
FEV1FVC-%Pred-Pre: 87 %
FEV6-%CHANGE-POST: -4 %
FEV6-%PRED-POST: 82 %
FEV6-%Pred-Pre: 86 %
FEV6-PRE: 2.81 L
FEV6-Post: 2.68 L
FEV6FVC-%PRED-POST: 102 %
FEV6FVC-%Pred-Pre: 102 %
FVC-%Change-Post: -4 %
FVC-%PRED-POST: 80 %
FVC-%Pred-Pre: 84 %
FVC-Post: 2.68 L
FVC-Pre: 2.81 L
PRE FEV1/FVC RATIO: 72 %
Post FEV1/FVC ratio: 77 %
Post FEV6/FVC ratio: 100 %
Pre FEV6/FVC Ratio: 100 %

## 2014-08-14 MED ORDER — SODIUM CHLORIDE 0.9 % IN NEBU
3.0000 mL | INHALATION_SOLUTION | Freq: Once | RESPIRATORY_TRACT | Status: AC
  Administered 2014-08-14: 3 mL via RESPIRATORY_TRACT

## 2014-08-14 MED ORDER — METHACHOLINE 16 MG/ML NEB SOLN
2.0000 mL | Freq: Once | RESPIRATORY_TRACT | Status: AC
  Administered 2014-08-14: 32 mg via RESPIRATORY_TRACT

## 2014-08-14 MED ORDER — METHACHOLINE 4 MG/ML NEB SOLN
2.0000 mL | Freq: Once | RESPIRATORY_TRACT | Status: AC
  Administered 2014-08-14: 8 mg via RESPIRATORY_TRACT

## 2014-08-14 MED ORDER — METHACHOLINE 1 MG/ML NEB SOLN
2.0000 mL | Freq: Once | RESPIRATORY_TRACT | Status: AC
  Administered 2014-08-14: 2 mg via RESPIRATORY_TRACT

## 2014-08-14 MED ORDER — METHACHOLINE 0.0625 MG/ML NEB SOLN
2.0000 mL | Freq: Once | RESPIRATORY_TRACT | Status: AC
  Administered 2014-08-14: 0.125 mg via RESPIRATORY_TRACT

## 2014-08-14 MED ORDER — METHACHOLINE 0.25 MG/ML NEB SOLN
2.0000 mL | Freq: Once | RESPIRATORY_TRACT | Status: AC
  Administered 2014-08-14: 0.5 mg via RESPIRATORY_TRACT

## 2014-08-14 MED ORDER — ALBUTEROL SULFATE (2.5 MG/3ML) 0.083% IN NEBU
2.5000 mg | INHALATION_SOLUTION | Freq: Once | RESPIRATORY_TRACT | Status: AC
  Administered 2014-08-14: 2.5 mg via RESPIRATORY_TRACT

## 2014-08-15 ENCOUNTER — Telehealth: Payer: Self-pay | Admitting: Internal Medicine

## 2014-08-15 MED ORDER — PREDNISONE 10 MG PO TABS
ORAL_TABLET | ORAL | Status: DC
Start: 2014-08-15 — End: 2014-10-04

## 2014-08-15 MED ORDER — LEVOFLOXACIN 500 MG PO TABS: 500.0000 mg | ORAL_TABLET | Freq: Every day | ORAL | Status: DC

## 2014-08-15 NOTE — Telephone Encounter (Signed)
Let her know Methacholine challenge 08/14/14 was borderline positive for asthma. We will Rx for asthma flare empirically +/- bacterial infection as follow   take levaquin 500mg  once daily  X 6 days  Take prednisone 40 mg daily x 2 days, then 20mg  daily x 2 days, then 10mg  daily x 2 days, then 5mg  daily x 2 days and stop  If getting worse on this or not better, go to ER  Dr. Brand Males, M.D., [facility].C.P Pulmonary and Critical Care Medicine Staff Physician [place] [facility] Pager: [phone], If no answer or between  15:00h - 7:00h: call [phone]  08/15/2014 10:22 AM

## 2014-08-15 NOTE — Telephone Encounter (Signed)
Called and spoke to pt. Informed pt of the recs per MR. Rx sent to preferred pharmacy. Pt verbalized understanding and denied any further questions or concerns at this time.  

## 2014-08-15 NOTE — Telephone Encounter (Signed)
Called and spoke to pt. Pt c/o worsening symptoms since last seen by MR on 08/01/14. Pt c/o increase in SOB, wheezing, headache, and prod cough with white foam mucus with green and brown bits within mucus x 1 week. Pt stated she has had chills and sweats but has not taken temperature if febrile. Pt denies CP/tightness.   MR please advise.   Allergies  Allergen Reactions  . Aspirin Hives  . Dilaudid [Hydromorphone] Itching  . Food Other (See Comments)    [place]ima beans  . Tramadol Other (See Comments)    Headache

## 2014-08-24 ENCOUNTER — Ambulatory Visit: Payer: BC Managed Care – PPO

## 2014-08-27 ENCOUNTER — Encounter: Payer: Self-pay | Admitting: Gastroenterology

## 2014-08-30 ENCOUNTER — Other Ambulatory Visit: Payer: Self-pay | Admitting: Internal Medicine

## 2014-09-06 ENCOUNTER — Ambulatory Visit: Payer: BC Managed Care – PPO | Admitting: Internal Medicine

## 2014-09-07 ENCOUNTER — Ambulatory Visit: Payer: BC Managed Care – PPO | Admitting: Internal Medicine

## 2014-09-11 ENCOUNTER — Institutional Professional Consult (permissible substitution): Payer: BC Managed Care – PPO | Admitting: Pulmonary Disease

## 2014-09-11 ENCOUNTER — Telehealth: Payer: Self-pay | Admitting: Internal Medicine

## 2014-09-13 ENCOUNTER — Ambulatory Visit: Payer: BC Managed Care – PPO | Admitting: Gastroenterology

## 2014-09-13 MED ORDER — PROMETHAZINE HCL 25 MG PO TABS: 25.0000 mg | ORAL_TABLET | Freq: Four times a day (QID) | ORAL | Status: DC | PRN

## 2014-09-13 NOTE — Telephone Encounter (Signed)
Patient would like to know why the below medication was denied.  She would like a callback.

## 2014-09-13 NOTE — Telephone Encounter (Signed)
Spoke to pt, she wanted to know why Phenergan was denied. Told pt that is not a medication we routinely fill. Pt said she is having nausea and diarrhea and would like it filled. Told pt will check with Dr. Raliegh Ip and if okay will send to pharmacy. Pt verbalized understanding.

## 2014-09-13 NOTE — Telephone Encounter (Signed)
Discussed with Dr. Raliegh Ip, pt having nausea and would like her Phenergan refilled. Okay to refill medication per Dr. Raliegh Ip. Pt notified Rx sent to pharmacy.

## 2014-09-13 NOTE — Addendum Note (Signed)
Addended by: Marian Sorrow on: 09/13/2014 05:31 PM   Modules accepted: Orders

## 2014-10-01 ENCOUNTER — Encounter: Payer: Self-pay | Admitting: Oncology

## 2014-10-04 ENCOUNTER — Ambulatory Visit (INDEPENDENT_AMBULATORY_CARE_PROVIDER_SITE_OTHER): Payer: BLUE CROSS/BLUE SHIELD | Admitting: Family Medicine

## 2014-10-04 ENCOUNTER — Encounter: Payer: Self-pay | Admitting: Family Medicine

## 2014-10-04 VITALS — BP 120/80 | HR 72 | Temp 98.1°F | Wt 212.0 lb

## 2014-10-04 DIAGNOSIS — N898 Other specified noninflammatory disorders of vagina: Secondary | ICD-10-CM

## 2014-10-04 DIAGNOSIS — R300 Dysuria: Secondary | ICD-10-CM

## 2014-10-04 LAB — POCT URINALYSIS DIPSTICK
Bilirubin, UA: NEGATIVE
Blood, UA: NEGATIVE
GLUCOSE UA: NEGATIVE
KETONES UA: NEGATIVE
LEUKOCYTES UA: NEGATIVE
NITRITE UA: NEGATIVE
SPEC GRAV UA: 1.025
Urobilinogen, UA: 0.2
pH, UA: 5.5

## 2014-10-04 MED ORDER — FLUCONAZOLE 150 MG PO TABS: 150.0000 mg | ORAL_TABLET | Freq: Once | ORAL | Status: DC

## 2014-10-04 NOTE — Progress Notes (Signed)
Pre visit review using our clinic review tool, if applicable. No additional management support is needed unless otherwise documented below in the visit note. 

## 2014-10-04 NOTE — Progress Notes (Signed)
   Subjective:    Patient ID: Rhonda [name], female    DOB: May 10, 1971, 44 y.o.   MRN: [id number]  HPI Patient is seen with some vaginal itching and minimal discharge. There was initial complaint of dysuria but on further questioning she is not having burning with urination. She did recently start using some baby powder and wonders if this might have caused irritation. She is monogamous. No history of STD. No fevers or chills. She had previous hysterectomy 1998. No pelvic pain. She was on some prednisone back in December. No recent antibiotics. No history of diabetes.  Past Medical History  Diagnosis Date  . Allergy   . Hypertension   . IBS (irritable bowel syndrome)   . Hyperlipemia   . Anxiety   . GERD (gastroesophageal reflux disease)   . Chronic constipation   . Thyroid disease   . Nephropathy   . Kidney disease    Past Surgical History  Procedure Laterality Date  . Abdominal hysterectomy  1998  . Cholecystectomy  1997  . Esophagogastroduodenoscopy    . Tubal ligation    . Thyroid surgery      reports that she has never smoked. She has never used smokeless tobacco. She reports that she does not drink alcohol or use illicit drugs. family history includes Coronary artery disease in her father; Hypertension in her father. Allergies  Allergen Reactions  . Aspirin Hives  . Dilaudid [Hydromorphone] Itching  . Food Other (See Comments)    Lima beans  . Tramadol Other (See Comments)    Headache       Review of Systems  Constitutional: Negative for fever and chills.  Gastrointestinal: Negative for nausea and vomiting.  Genitourinary: Positive for vaginal discharge. Negative for hematuria, flank pain, genital sores, vaginal pain and pelvic pain.       Objective:   Physical Exam  Constitutional: She appears well-developed and well-nourished.  Cardiovascular: Normal rate and regular rhythm.   Pulmonary/Chest: Effort normal and breath sounds normal. No respiratory  distress. She has no wheezes. She has no rales.  Abdominal: Soft. Bowel sounds are normal. She exhibits no distension and no mass. There is no tenderness. There is no rebound and no guarding.          Assessment & Plan:  Patient seen with some vaginal discharge and itching. Urinalysis unremarkable except for 3+ protein which is related to her chronic kidney disease. No evidence for UTI. We offered pelvic exam and wet prep and she defers. Leave off baby powder which could be irritating. Consider Diflucan 150 mg 1 dose. If symptoms persist will need full pelvic exam and further cultures and KOH

## 2014-10-04 NOTE — Patient Instructions (Signed)
Your urinalysis does not show any evidence for UTI Consider Fluconazole if vaginal itching and discharge persist.

## 2014-10-22 ENCOUNTER — Encounter: Payer: Self-pay | Admitting: Gastroenterology

## 2014-10-22 ENCOUNTER — Ambulatory Visit (INDEPENDENT_AMBULATORY_CARE_PROVIDER_SITE_OTHER): Payer: BLUE CROSS/BLUE SHIELD | Admitting: Gastroenterology

## 2014-10-22 VITALS — BP 122/84 | HR 76 | Ht 65.75 in | Wt 212.1 lb

## 2014-10-22 DIAGNOSIS — K5909 Other constipation: Secondary | ICD-10-CM

## 2014-10-22 DIAGNOSIS — K219 Gastro-esophageal reflux disease without esophagitis: Secondary | ICD-10-CM

## 2014-10-22 DIAGNOSIS — K625 Hemorrhage of anus and rectum: Secondary | ICD-10-CM

## 2014-10-22 MED ORDER — DEXLANSOPRAZOLE 60 MG PO CPDR: 60.0000 mg | DELAYED_RELEASE_CAPSULE | Freq: Every day | ORAL | Status: DC

## 2014-10-22 NOTE — Assessment & Plan Note (Addendum)
Patient remains symptomatic despite daily omeprazole.  Plan to switch to dexilant 60 mg daily and employ antireflux measures.

## 2014-10-22 NOTE — Assessment & Plan Note (Signed)
Constipation is associated with abdominal pain, even after bowel movements.  Symptoms suggest IBS/constipation.  The patient will consider enrollment in an IBS trial if she qualifies.  Otherwise, I will try her on Linzess

## 2014-10-22 NOTE — Progress Notes (Signed)
_                                                                                                                History of Present Illness:  Rhonda [name] is a 44 year old Afro-American female referred at the request of Dr. Burnice Logan for evaluation of reflux and rectal bleeding.  She was seen for similar complaints in 2012.  Upper endoscopy was unrevealing.  She underwent right lobe pH study that showed moderate acid reflux.  Asked again teen scan was normal.  Colonoscopy was positive for a diminutive polyp and internal hemorrhoids.  Pathology is not available on the polyp.  Despite taking daily omeprazole she has frequent pyrosis at least 2-3 days out of the week.  Discomfort may radiate into her ears.  She denies dysphagia.  She suffers from chronic constipation.  With stool softeners she may move her bowels every other day.  Without medications she may go 3-4 days without a bowel movement.  With constipation she has lower abdominal and rectal discomfort.  Even with a bowel movement abdominal pain continues.  She complains of limited rectal bleeding consisting of small amount of blood on the toilet tissue or in the water.   Past Medical History  Diagnosis Date  . Allergy   . Hypertension   . IBS (irritable bowel syndrome)   . Hyperlipemia   . Anxiety   . GERD (gastroesophageal reflux disease)   . Chronic constipation   . Thyroid disease   . Nephropathy   . Kidney disease    Past Surgical History  Procedure Laterality Date  . Abdominal hysterectomy  1998  . Cholecystectomy  1997  . Esophagogastroduodenoscopy    . Tubal ligation    . Thyroid surgery     family history includes Coronary artery disease in her father; Diabetes in her father and mother; Hypertension in her father. There is no history of Colon cancer, Colon polyps, Kidney disease, Esophageal cancer, or Gallbladder disease. Current Outpatient Prescriptions  Medication Sig Dispense Refill  . albuterol  (PROVENTIL HFA;VENTOLIN HFA) 108 (90 BASE) MCG/ACT inhaler Inhale 2 puffs into the lungs every 6 (six) hours as needed for wheezing or shortness of breath.    Marland Kitchen alum & mag hydroxide-simeth (MAALOX/MYLANTA) 200-200-20 MG/5ML suspension Take by mouth. Pt uses 2-3 times a week; Pt goes through a bottle in 2 weeks    . amLODipine (NORVASC) 5 MG tablet TAKE 1 TABLET BY MOUTH EVERY DAY 30 tablet 11  . calcium carbonate (TUMS EX) 750 MG chewable tablet Chew 1 tablet by mouth as needed for heartburn.    . cetirizine (ZYRTEC) 10 MG tablet Take 10 mg by mouth daily.    Marland Kitchen docusate sodium (COLACE) 100 MG capsule Take 100 mg by mouth daily.     . fluticasone (FLONASE) 50 MCG/ACT nasal spray Place 2 sprays into both nostrils daily. 16 g 2  . HYDROcodone-homatropine (HYCODAN) 5-1.5 MG/5ML syrup Take 5 mLs by mouth at bedtime as needed for cough. 120 mL 0  .  levothyroxine (SYNTHROID, LEVOTHROID) 150 MCG tablet TAKE 1 TABLET BY MOUTH EVERY DAY BEFORE BREAKFAST 30 tablet 3  . metoprolol succinate (TOPROL-XL) 50 MG 24 hr tablet TAKE 1 TABLET BY MOUTH EVERY DAY. TAKE IMMEDIATELY FOLLOWING A MEAL 30 tablet 11  . Multiple Vitamin (MULTIVITAMIN) tablet Take 1 tablet by mouth daily.      Marland Kitchen omeprazole (PRILOSEC) 40 MG capsule Take 40 mg by mouth 2 (two) times daily.   4  . OVER THE COUNTER MEDICATION Cough suppressant and expectorant OTC    . promethazine (PHENERGAN) 25 MG tablet Take 1 tablet (25 mg total) by mouth every 6 (six) hours as needed for nausea or vomiting. 30 tablet 0  . valsartan (DIOVAN) 160 MG tablet TAKE 1 TABLET BY MOUTH EVERY DAY 30 tablet 3   No current facility-administered medications for this visit.   Allergies as of 10/22/2014 - Review Complete 10/22/2014  Allergen Reaction Noted  . Aspirin Hives   . Dilaudid [hydromorphone] Itching 07/01/2014  . Food Other (See Comments) 10/02/2011  . Tramadol Other (See Comments) 10/02/2011    reports that she has never smoked. She has never used smokeless  tobacco. She reports that she does not drink alcohol or use illicit drugs.   Review of Systems: Pertinent positive and negative review of systems were noted in the above HPI section. All other review of systems were otherwise negative.  Vital signs were reviewed in today's medical record Physical Exam: General: Well developed , well nourished, no acute distress Skin: anicteric Head: Normocephalic and atraumatic Eyes:  sclerae anicteric, EOMI Ears: Normal auditory acuity Mouth: No deformity or lesions Neck: Supple, no masses or thyromegaly Lungs: Clear throughout to auscultation Heart: Regular rate and rhythm; no murmurs, rubs or bruits Abdomen: Soft, non tender and non distended. No masses, hepatosplenomegaly or hernias noted. Normal Bowel sounds Rectal:deferred Musculoskeletal: Symmetrical with no gross deformities  Skin: No lesions on visible extremities Pulses:  Normal pulses noted Extremities: No clubbing, cyanosis, edema or deformities noted Neurological: Alert oriented x 4, grossly nonfocal Cervical Nodes:  No significant cervical adenopathy Inguinal Nodes: No significant inguinal adenopathy Psychological:  Alert and cooperative. Normal mood and affect  See Assessment and Plan under Problem List

## 2014-10-22 NOTE — Assessment & Plan Note (Signed)
Limited rectal bleeding is very likely hemorrhoidal.  I will address this problem once constipation has resolved.

## 2014-10-22 NOTE — Patient Instructions (Addendum)
A prescription has been sent to your pharmacy.  Please look over and follow information given to you about GERD.  Gastroesophageal Reflux Disease, Adult Gastroesophageal reflux disease (GERD) happens when acid from your stomach flows up into the esophagus. When acid comes in contact with the esophagus, the acid causes soreness (inflammation) in the esophagus. Over time, GERD may create small holes (ulcers) in the lining of the esophagus. CAUSES   Increased body weight. This puts pressure on the stomach, making acid rise from the stomach into the esophagus.  Smoking. This increases acid production in the stomach.  Drinking alcohol. This causes decreased pressure in the lower esophageal sphincter (valve or ring of muscle between the esophagus and stomach), allowing acid from the stomach into the esophagus.  Late evening meals and a full stomach. This increases pressure and acid production in the stomach.  A malformed lower esophageal sphincter. Sometimes, no cause is found. SYMPTOMS   Burning pain in the lower part of the mid-chest behind the breastbone and in the mid-stomach area. This may occur twice a week or more often.  Trouble swallowing.  Sore throat.  Dry cough.  Asthma-like symptoms including chest tightness, shortness of breath, or wheezing. DIAGNOSIS  Your caregiver may be able to diagnose GERD based on your symptoms. In some cases, X-rays and other tests may be done to check for complications or to check the condition of your stomach and esophagus. TREATMENT  Your caregiver may recommend over-the-counter or prescription medicines to help decrease acid production. Ask your caregiver before starting or adding any new medicines.  HOME CARE INSTRUCTIONS   Change the factors that you can control. Ask your caregiver for guidance concerning weight loss, quitting smoking, and alcohol consumption.  Avoid foods and drinks that make your symptoms worse, such as:  Caffeine or  alcoholic drinks.  Chocolate.  Peppermint or mint flavorings.  Garlic and onions.  Spicy foods.  Citrus fruits, such as oranges, lemons, or limes.  Tomato-based foods such as sauce, chili, salsa, and pizza.  Fried and fatty foods.  Avoid lying down for the 3 hours prior to your bedtime or prior to taking a nap.  Eat small, frequent meals instead of large meals.  Wear loose-fitting clothing. Do not wear anything tight around your waist that causes pressure on your stomach.  Raise the head of your bed 6 to 8 inches with wood blocks to help you sleep. Extra pillows will not help.  Only take over-the-counter or prescription medicines for pain, discomfort, or fever as directed by your caregiver.  Do not take aspirin, ibuprofen, or other nonsteroidal anti-inflammatory drugs (NSAIDs). SEEK IMMEDIATE MEDICAL CARE IF:   You have pain in your arms, neck, jaw, teeth, or back.  Your pain increases or changes in intensity or duration.  You develop nausea, vomiting, or sweating (diaphoresis).  You develop shortness of breath, or you faint.  Your vomit is green, yellow, black, or looks like coffee grounds or blood.  Your stool is red, bloody, or black. These symptoms could be signs of other problems, such as heart disease, gastric bleeding, or esophageal bleeding. MAKE SURE YOU:   Understand these instructions.  Will watch your condition.  Will get help right away if you are not doing well or get worse. Document Released: 05/13/2005 Document Revised: 10/26/2011 Document Reviewed: 02/20/2011 [facility] Patient Information 2015 [place], [place]. This information is not intended to replace advice given to you by your health care provider. Make sure you discuss any questions you have  with your health care provider.   Food Choices for Gastroesophageal Reflux Disease When you have gastroesophageal reflux disease (GERD), the foods you eat and your eating habits are very important.  Choosing the right foods can help ease the discomfort of GERD. WHAT GENERAL GUIDELINES DO I NEED TO FOLLOW?  Choose fruits, vegetables, whole grains, low-fat dairy products, and low-fat meat, fish, and poultry.  Limit fats such as oils, salad dressings, butter, nuts, and avocado.  Keep a food diary to identify foods that cause symptoms.  Avoid foods that cause reflux. These may be different for different people.  Eat frequent small meals instead of three large meals each day.  Eat your meals slowly, in a relaxed setting.  Limit fried foods.  Cook foods using methods other than frying.  Avoid drinking alcohol.  Avoid drinking large amounts of liquids with your meals.  Avoid bending over or lying down until 2-3 hours after eating. WHAT FOODS ARE NOT RECOMMENDED? The following are some foods and drinks that may worsen your symptoms: Vegetables Tomatoes. Tomato juice. Tomato and spaghetti sauce. Chili peppers. Onion and garlic. Horseradish. Fruits Oranges, grapefruit, and lemon (fruit and juice). Meats High-fat meats, fish, and poultry. This includes hot dogs, ribs, ham, sausage, salami, and bacon. Dairy Whole milk and chocolate milk. Sour cream. Cream. Butter. Ice cream. Cream cheese.  Beverages Coffee and tea, with or without caffeine. Carbonated beverages or energy drinks. Condiments Hot sauce. Barbecue sauce.  Sweets/Desserts Chocolate and cocoa. Donuts. Peppermint and spearmint. Fats and Oils High-fat foods, including [place] fries and potato chips. Other Vinegar. Strong spices, such as black pepper, white pepper, red pepper, cayenne, curry powder, cloves, ginger, and chili powder. The items listed above may not be a complete list of foods and beverages to avoid. Contact your dietitian for more information. Document Released: 08/03/2005 Document Revised: 08/08/2013 Document Reviewed: 06/07/2013 [facility] Patient Information 2015 [place], [place]. This information is not  intended to replace advice given to you by your health care provider. Make sure you discuss any questions you have with your health care provider.  Please follow up with Dr Deatra Ina in 2 months.

## 2014-10-26 ENCOUNTER — Telehealth: Payer: Self-pay | Admitting: *Deleted

## 2014-10-26 ENCOUNTER — Other Ambulatory Visit: Payer: Self-pay | Admitting: Internal Medicine

## 2014-10-26 ENCOUNTER — Encounter: Payer: Self-pay | Admitting: Gastroenterology

## 2014-10-26 DIAGNOSIS — Z1231 Encounter for screening mammogram for malignant neoplasm of breast: Secondary | ICD-10-CM

## 2014-10-26 MED ORDER — ESOMEPRAZOLE MAGNESIUM 40 MG PO CPDR: 40.0000 mg | DELAYED_RELEASE_CAPSULE | Freq: Two times a day (BID) | ORAL | Status: DC

## 2014-10-26 NOTE — Telephone Encounter (Signed)
L/m for pt will send in Nexium. Do not see that she has taken Nexium before

## 2014-11-15 ENCOUNTER — Ambulatory Visit (HOSPITAL_COMMUNITY)
Admission: RE | Admit: 2014-11-15 | Discharge: 2014-11-15 | Disposition: A | Discharge status: Home / Self Care | Payer: BLUE CROSS/BLUE SHIELD | Source: Ambulatory Visit | Attending: Internal Medicine | Admitting: Internal Medicine

## 2014-11-15 DIAGNOSIS — Z1231 Encounter for screening mammogram for malignant neoplasm of breast: Secondary | ICD-10-CM | POA: Insufficient documentation

## 2014-12-11 ENCOUNTER — Ambulatory Visit: Payer: BLUE CROSS/BLUE SHIELD | Admitting: Internal Medicine

## 2014-12-17 ENCOUNTER — Encounter: Payer: Self-pay | Admitting: Internal Medicine

## 2014-12-17 ENCOUNTER — Ambulatory Visit (INDEPENDENT_AMBULATORY_CARE_PROVIDER_SITE_OTHER): Payer: BLUE CROSS/BLUE SHIELD | Admitting: Internal Medicine

## 2014-12-17 VITALS — BP 120/80 | HR 74 | Temp 98.4°F | Resp 20 | Ht 65.75 in | Wt 214.0 lb

## 2014-12-17 DIAGNOSIS — N183 Chronic kidney disease, stage 3 unspecified: Secondary | ICD-10-CM

## 2014-12-17 DIAGNOSIS — N289 Disorder of kidney and ureter, unspecified: Secondary | ICD-10-CM

## 2014-12-17 DIAGNOSIS — I10 Essential (primary) hypertension: Secondary | ICD-10-CM | POA: Diagnosis not present

## 2014-12-17 DIAGNOSIS — R300 Dysuria: Secondary | ICD-10-CM | POA: Diagnosis not present

## 2014-12-17 LAB — POCT URINALYSIS DIPSTICK
BILIRUBIN UA: NEGATIVE
Blood, UA: NEGATIVE
GLUCOSE UA: NEGATIVE
Ketones, UA: NEGATIVE
NITRITE UA: NEGATIVE
Spec Grav, UA: 1.025
Urobilinogen, UA: 0.2
pH, UA: 6

## 2014-12-17 MED ORDER — POLYETHYLENE GLYCOL 3350 17 GM/SCOOP PO POWD: 17.0000 g | Freq: Two times a day (BID) | ORAL | Status: DC | PRN

## 2014-12-17 MED ORDER — PROMETHAZINE HCL 25 MG PO TABS: 25.0000 mg | ORAL_TABLET | Freq: Four times a day (QID) | ORAL | Status: DC | PRN

## 2014-12-17 NOTE — Patient Instructions (Signed)
Limit your sodium (Salt) intake  Please check your blood pressure on a regular basis.  If it is consistently greater than 150/90, please make an office appointment.  Return in 6 months for follow-up Constipation Constipation is when a person has fewer than three bowel movements a week, has difficulty having a bowel movement, or has stools that are dry, hard, or larger than normal. As people grow older, constipation is more common. If you try to fix constipation with medicines that make you have a bowel movement (laxatives), the problem may get worse. Long-term laxative use may cause the muscles of the colon to become weak. A low-fiber diet, not taking in enough fluids, and taking certain medicines may make constipation worse.  CAUSES   Certain medicines, such as antidepressants, pain medicine, iron supplements, antacids, and water pills.   Certain diseases, such as diabetes, irritable bowel syndrome (IBS), thyroid disease, or depression.   Not drinking enough water.   Not eating enough fiber-rich foods.   Stress or travel.   Lack of physical activity or exercise.   Ignoring the urge to have a bowel movement.   Using laxatives too much.  SIGNS AND SYMPTOMS   Having fewer than three bowel movements a week.   Straining to have a bowel movement.   Having stools that are hard, dry, or larger than normal.   Feeling full or bloated.   Pain in the lower abdomen.   Not feeling relief after having a bowel movement.  DIAGNOSIS  Your health care provider will take a medical history and perform a physical exam. Further testing may be done for severe constipation. Some tests may include:  A barium enema X-ray to examine your rectum, colon, and, sometimes, your small intestine.   A sigmoidoscopy to examine your lower colon.   A colonoscopy to examine your entire colon. TREATMENT  Treatment will depend on the severity of your constipation and what is causing it. Some  dietary treatments include drinking more fluids and eating more fiber-rich foods. Lifestyle treatments may include regular exercise. If these diet and lifestyle recommendations do not help, your health care provider may recommend taking over-the-counter laxative medicines to help you have bowel movements. Prescription medicines may be prescribed if over-the-counter medicines do not work.  HOME CARE INSTRUCTIONS   Eat foods that have a lot of fiber, such as fruits, vegetables, whole grains, and beans.  Limit foods high in fat and processed sugars, such as french fries, hamburgers, cookies, candies, and soda.   A fiber supplement may be added to your diet if you cannot get enough fiber from foods.   Drink enough fluids to keep your urine clear or pale yellow.   Exercise regularly or as directed by your health care provider.   Go to the restroom when you have the urge to go. Do not hold it.   Only take over-the-counter or prescription medicines as directed by your health care provider. Do not take other medicines for constipation without talking to your health care provider first.  [place] IF:   You have bright red blood in your stool.   Your constipation lasts for more than 4 days or gets worse.   You have abdominal or rectal pain.   You have thin, pencil-like stools.   You have unexplained weight loss. MAKE SURE YOU:   Understand these instructions.  Will watch your condition.  Will get help right away if you are not doing well or get worse. Document Released:  05/01/2004 Document Revised: 08/08/2013 Document Reviewed: 05/15/2013 [facility] Patient Information 2015 [place], [place]. This information is not intended to replace advice given to you by your health care provider. Make sure you discuss any questions you have with your health care provider.

## 2014-12-17 NOTE — Progress Notes (Signed)
Pre visit review using our clinic review tool, if applicable. No additional management support is needed unless otherwise documented below in the visit note. 

## 2014-12-17 NOTE — Progress Notes (Signed)
Subjective:    Patient ID: Rhonda [name], female    DOB: 1970-12-14, 44 y.o.   MRN: [id number]  HPI  44 year old patient who is seen today for her biannual follow-up.  She has a history of essential hypertension, chronic kidney disease as well as slow transit constipation.  Doing fairly well.  She does complain of some occasional left-sided abdominal pain related to her constipation.  Past Medical History  Diagnosis Date  . Allergy   . Hypertension   . IBS (irritable bowel syndrome)   . Hyperlipemia   . Anxiety   . GERD (gastroesophageal reflux disease)   . Chronic constipation   . Thyroid disease   . Nephropathy   . Kidney disease     History   Social History  . Marital Status: Divorced    Spouse Name: N/A  . Number of Children: 2  . Years of Education: N/A   Occupational History  . NURSING ASSISTANT    Social History Main Topics  . Smoking status: Never Smoker   . Smokeless tobacco: Never Used  . Alcohol Use: No  . Drug Use: No  . Sexual Activity: Yes    Birth Control/ Protection: Surgical   Other Topics Concern  . Not on file   Social History Narrative    Past Surgical History  Procedure Laterality Date  . Abdominal hysterectomy  1998  . Cholecystectomy  1997  . Esophagogastroduodenoscopy    . Tubal ligation    . Thyroid surgery      Family History  Problem Relation Age of Onset  . Hypertension Father   . Coronary artery disease Father   . Colon cancer Neg Hx   . Colon polyps Neg Hx   . Kidney disease Neg Hx   . Esophageal cancer Neg Hx   . Gallbladder disease Neg Hx   . Diabetes Father   . Diabetes Mother     Pre-diabetic    Allergies  Allergen Reactions  . Aspirin Hives  . Dilaudid [Hydromorphone] Itching  . Food Other (See Comments)    Lima beans  . Tramadol Other (See Comments)    Headache     Current Outpatient Prescriptions on File Prior to Visit  Medication Sig Dispense Refill  . albuterol (PROVENTIL HFA;VENTOLIN HFA)  108 (90 BASE) MCG/ACT inhaler Inhale 2 puffs into the lungs every 6 (six) hours as needed for wheezing or shortness of breath.    Marland Kitchen alum & mag hydroxide-simeth (MAALOX/MYLANTA) 200-200-20 MG/5ML suspension Take by mouth. Pt uses 2-3 times a week; Pt goes through a bottle in 2 weeks    . amLODipine (NORVASC) 5 MG tablet TAKE 1 TABLET BY MOUTH EVERY DAY 30 tablet 11  . calcium carbonate (TUMS EX) 750 MG chewable tablet Chew 1 tablet by mouth as needed for heartburn.    . cetirizine (ZYRTEC) 10 MG tablet Take 10 mg by mouth daily.    Marland Kitchen docusate sodium (COLACE) 100 MG capsule Take 100 mg by mouth daily.     . fluticasone (FLONASE) 50 MCG/ACT nasal spray Place 2 sprays into both nostrils daily. 16 g 2  . levothyroxine (SYNTHROID, LEVOTHROID) 150 MCG tablet TAKE 1 TABLET BY MOUTH EVERY DAY BEFORE BREAKFAST 30 tablet 3  . metoprolol succinate (TOPROL-XL) 50 MG 24 hr tablet TAKE 1 TABLET BY MOUTH EVERY DAY. TAKE IMMEDIATELY FOLLOWING A MEAL 30 tablet 11  . Multiple Vitamin (MULTIVITAMIN) tablet Take 1 tablet by mouth daily.      Marland Kitchen OVER THE COUNTER  MEDICATION Cough suppressant and expectorant OTC    . valsartan (DIOVAN) 160 MG tablet TAKE 1 TABLET BY MOUTH EVERY DAY 30 tablet 3   No current facility-administered medications on file prior to visit.    BP 120/80 mmHg  Pulse 74  Temp(Src) 98.4 F (36.9 C) (Oral)  Resp 20  Ht 5' 5.75" (1.67 m)  Wt 214 lb (97.07 kg)  BMI 34.81 kg/m2  SpO2 97%     Review of Systems  Constitutional: Negative.   HENT: Negative for congestion, dental problem, hearing loss, rhinorrhea, sinus pressure, sore throat and tinnitus.   Eyes: Negative for pain, discharge and visual disturbance.  Respiratory: Negative for cough and shortness of breath.   Cardiovascular: Negative for chest pain, palpitations and leg swelling.  Gastrointestinal: Positive for abdominal pain and constipation. Negative for nausea, vomiting, diarrhea, blood in stool and abdominal distention.    Genitourinary: Negative for dysuria, urgency, frequency, hematuria, flank pain, vaginal bleeding, vaginal discharge, difficulty urinating, vaginal pain and pelvic pain.  Musculoskeletal: Negative for joint swelling, arthralgias and gait problem.  Skin: Negative for rash.  Neurological: Negative for dizziness, syncope, speech difficulty, weakness, numbness and headaches.  Hematological: Negative for adenopathy.  Psychiatric/Behavioral: Negative for behavioral problems, dysphoric mood and agitation. The patient is not nervous/anxious.        Objective:   Physical Exam  Constitutional: She is oriented to person, place, and time. She appears well-developed and well-nourished.  HENT:  Head: Normocephalic.  Right Ear: External ear normal.  Left Ear: External ear normal.  Mouth/Throat: Oropharynx is clear and moist.  Eyes: Conjunctivae and EOM are normal. Pupils are equal, round, and reactive to light.  Neck: Normal range of motion. Neck supple. No thyromegaly present.  Cardiovascular: Normal rate, regular rhythm, normal heart sounds and intact distal pulses.   Pulmonary/Chest: Effort normal and breath sounds normal.  Abdominal: Soft. Bowel sounds are normal. She exhibits no distension and no mass. There is no tenderness.  Musculoskeletal: Normal range of motion.  Lymphadenopathy:    She has no cervical adenopathy.  Neurological: She is alert and oriented to person, place, and time.  Skin: Skin is warm and dry. No rash noted.  Psychiatric: She has a normal mood and affect. Her behavior is normal.          Assessment & Plan:  Hypertension, well-controlled Chronic kidney disease.  We'll check renal indices Constipation.  Measures discussed.  Will add when necessary MiraLAX to her regimen  Recheck 6 months

## 2014-12-18 LAB — BASIC METABOLIC PANEL
BUN: 17 mg/dL (ref 6–23)
CO2: 25 mEq/L (ref 19–32)
Calcium: 10 mg/dL (ref 8.4–10.5)
Chloride: 105 mEq/L (ref 96–112)
Creatinine, Ser: 1.21 mg/dL — ABNORMAL HIGH (ref 0.40–1.20)
GFR: 61.64 mL/min (ref >60.00)
Glucose, Bld: 72 mg/dL (ref 70–99)
POTASSIUM: 4.2 meq/L (ref 3.5–5.1)
Sodium: 139 mEq/L (ref 135–145)

## 2015-01-03 ENCOUNTER — Other Ambulatory Visit: Payer: Self-pay | Admitting: Internal Medicine

## 2015-02-26 ENCOUNTER — Telehealth: Payer: Self-pay | Admitting: Internal Medicine

## 2015-02-26 MED ORDER — ALPRAZOLAM 0.25 MG PO TABS: 0.2500 mg | ORAL_TABLET | Freq: Two times a day (BID) | ORAL | Status: DC | PRN

## 2015-02-26 NOTE — Telephone Encounter (Signed)
Left message on personal voicemail Rx called into pharmacy.

## 2015-02-26 NOTE — Telephone Encounter (Signed)
Pt would like to restart the Xanax. Pt has had, "a death in the family and a lot of things going on." Pt uses the [facility] on [address].

## 2015-02-26 NOTE — Telephone Encounter (Signed)
Please see message and advise 

## 2015-02-26 NOTE — Telephone Encounter (Signed)
Generic Xanax 0.25 #60 one twice a day when necessary

## 2015-03-19 ENCOUNTER — Telehealth: Payer: Self-pay | Admitting: Internal Medicine

## 2015-03-19 MED ORDER — PROMETHAZINE HCL 25 MG PO TABS: 25.0000 mg | ORAL_TABLET | Freq: Four times a day (QID) | ORAL | Status: DC | PRN

## 2015-03-19 NOTE — Telephone Encounter (Signed)
Left message on personal voicemail Rx sent to pharmacy as requested.

## 2015-03-19 NOTE — Telephone Encounter (Signed)
Okay to refill Promethazine?

## 2015-03-19 NOTE — Telephone Encounter (Signed)
Pt said she has had nausea since the weekend and is asking if Dr Raliegh Ip will call her in some  promethazine (PHENERGAN) 25 MG tablet    [place]

## 2015-03-19 NOTE — Telephone Encounter (Signed)
ok 

## 2015-03-25 ENCOUNTER — Encounter: Payer: Self-pay | Admitting: Internal Medicine

## 2015-03-25 ENCOUNTER — Ambulatory Visit (INDEPENDENT_AMBULATORY_CARE_PROVIDER_SITE_OTHER): Payer: BLUE CROSS/BLUE SHIELD | Admitting: Internal Medicine

## 2015-03-25 VITALS — BP 120/88 | HR 95 | Temp 98.7°F | Resp 20 | Ht 65.75 in | Wt 219.0 lb

## 2015-03-25 DIAGNOSIS — R1030 Lower abdominal pain, unspecified: Secondary | ICD-10-CM

## 2015-03-25 DIAGNOSIS — N390 Urinary tract infection, site not specified: Secondary | ICD-10-CM | POA: Diagnosis not present

## 2015-03-25 DIAGNOSIS — R350 Frequency of micturition: Secondary | ICD-10-CM | POA: Diagnosis not present

## 2015-03-25 DIAGNOSIS — M545 Low back pain: Secondary | ICD-10-CM | POA: Diagnosis not present

## 2015-03-25 DIAGNOSIS — I10 Essential (primary) hypertension: Secondary | ICD-10-CM

## 2015-03-25 DIAGNOSIS — N289 Disorder of kidney and ureter, unspecified: Secondary | ICD-10-CM

## 2015-03-25 LAB — POCT URINALYSIS DIPSTICK
Bilirubin, UA: NEGATIVE
Glucose, UA: NEGATIVE
KETONES UA: NEGATIVE
Nitrite, UA: POSITIVE
Spec Grav, UA: >=1.03
Urobilinogen, UA: 0.2
pH, UA: 6

## 2015-03-25 MED ORDER — CIPROFLOXACIN HCL 500 MG PO TABS: 500.0000 mg | ORAL_TABLET | Freq: Two times a day (BID) | ORAL | Status: DC

## 2015-03-25 NOTE — Progress Notes (Signed)
Pre visit review using our clinic review tool, if applicable. No additional management support is needed unless otherwise documented below in the visit note. 

## 2015-03-25 NOTE — Progress Notes (Signed)
Subjective:    Patient ID: Rhonda [name], female    DOB: 1971/07/05, 44 y.o.   MRN: [id number]  HPI  44 year old patient who is followed by renal medicine.  She presents with a three-day history of dysuria, frequency, low back and suprapubic pain.  She has had some associated nausea.  No fever, chills or flank pain. She has essential hypertension which has been controlled  Past Medical History  Diagnosis Date  . Allergy   . Hypertension   . IBS (irritable bowel syndrome)   . Hyperlipemia   . Anxiety   . GERD (gastroesophageal reflux disease)   . Chronic constipation   . Thyroid disease   . Nephropathy   . Kidney disease     History   Social History  . Marital Status: Divorced    Spouse Name: N/A  . Number of Children: 2  . Years of Education: N/A   Occupational History  . NURSING ASSISTANT    Social History Main Topics  . Smoking status: Never Smoker   . Smokeless tobacco: Never Used  . Alcohol Use: No  . Drug Use: No  . Sexual Activity: Yes    Birth Control/ Protection: Surgical   Other Topics Concern  . Not on file   Social History Narrative    Past Surgical History  Procedure Laterality Date  . Abdominal hysterectomy  1998  . Cholecystectomy  1997  . Esophagogastroduodenoscopy    . Tubal ligation    . Thyroid surgery      Family History  Problem Relation Age of Onset  . Hypertension Father   . Coronary artery disease Father   . Colon cancer Neg Hx   . Colon polyps Neg Hx   . Kidney disease Neg Hx   . Esophageal cancer Neg Hx   . Gallbladder disease Neg Hx   . Diabetes Father   . Diabetes Mother     Pre-diabetic    Allergies  Allergen Reactions  . Aspirin Hives  . Dilaudid [Hydromorphone] Itching  . Food Other (See Comments)    Lima beans  . Tramadol Other (See Comments)    Headache     Current Outpatient Prescriptions on File Prior to Visit  Medication Sig Dispense Refill  . albuterol (PROVENTIL HFA;VENTOLIN HFA) 108 (90 BASE)  MCG/ACT inhaler Inhale 2 puffs into the lungs every 6 (six) hours as needed for wheezing or shortness of breath.    . ALPRAZolam (XANAX) 0.25 MG tablet Take 1 tablet (0.25 mg total) by mouth 2 (two) times daily as needed for anxiety. 60 tablet 0  . alum & mag hydroxide-simeth (MAALOX/MYLANTA) 200-200-20 MG/5ML suspension Take by mouth. Pt uses 2-3 times a week; Pt goes through a bottle in 2 weeks    . amLODipine (NORVASC) 5 MG tablet TAKE 1 TABLET BY MOUTH EVERY DAY 30 tablet 11  . calcium carbonate (TUMS EX) 750 MG chewable tablet Chew 1 tablet by mouth as needed for heartburn.    . cetirizine (ZYRTEC) 10 MG tablet Take 10 mg by mouth daily.    Marland Kitchen docusate sodium (COLACE) 100 MG capsule Take 100 mg by mouth daily.     . fluticasone (FLONASE) 50 MCG/ACT nasal spray Place 2 sprays into both nostrils daily. 16 g 2  . levothyroxine (SYNTHROID, LEVOTHROID) 150 MCG tablet TAKE 1 TABLET BY MOUTH EVERY DAY BEFORE BREAKFAST 30 tablet 5  . metoprolol succinate (TOPROL-XL) 50 MG 24 hr tablet TAKE 1 TABLET BY MOUTH EVERY DAY. TAKE IMMEDIATELY FOLLOWING  A MEAL 30 tablet 11  . Multiple Vitamin (MULTIVITAMIN) tablet Take 1 tablet by mouth daily.      Marland Kitchen omeprazole (PRILOSEC) 40 MG capsule TAKE ONE CAPSULE BY MOUTH EVERY DAY 30 capsule 0  . OVER THE COUNTER MEDICATION Cough suppressant and expectorant OTC    . polyethylene glycol powder (GLYCOLAX/MIRALAX) powder Take 17 g by mouth 2 (two) times daily as needed. 3350 g 1  . promethazine (PHENERGAN) 25 MG tablet Take 1 tablet (25 mg total) by mouth every 6 (six) hours as needed for nausea or vomiting. 30 tablet 0  . valsartan (DIOVAN) 160 MG tablet TAKE 1 TABLET BY MOUTH EVERY DAY 30 tablet 5   No current facility-administered medications on file prior to visit.    BP 120/88 mmHg  Pulse 95  Temp(Src) 98.7 F (37.1 C) (Oral)  Resp 20  Ht 5' 5.75" (1.67 m)  Wt 219 lb (99.338 kg)  BMI 35.62 kg/m2  SpO2 98%     Review of Systems  Constitutional: Negative.    HENT: Negative for congestion, dental problem, hearing loss, rhinorrhea, sinus pressure, sore throat and tinnitus.   Eyes: Negative for pain, discharge and visual disturbance.  Respiratory: Negative for cough and shortness of breath.   Cardiovascular: Negative for chest pain, palpitations and leg swelling.  Gastrointestinal: Negative for nausea, vomiting, abdominal pain, diarrhea, constipation, blood in stool and abdominal distention.  Genitourinary: Positive for frequency, difficulty urinating and pelvic pain. Negative for dysuria, urgency, hematuria, flank pain, vaginal bleeding, vaginal discharge and vaginal pain.  Musculoskeletal: Negative for joint swelling, arthralgias and gait problem.  Skin: Negative for rash.  Neurological: Negative for dizziness, syncope, speech difficulty, weakness, numbness and headaches.  Hematological: Negative for adenopathy.  Psychiatric/Behavioral: Negative for behavioral problems, dysphoric mood and agitation. The patient is not nervous/anxious.        Objective:   Physical Exam  Constitutional: She is oriented to person, place, and time. She appears well-developed and well-nourished.  Blood pressure 130/80  HENT:  Head: Normocephalic.  Right Ear: External ear normal.  Left Ear: External ear normal.  Mouth/Throat: Oropharynx is clear and moist.  Eyes: Conjunctivae and EOM are normal. Pupils are equal, round, and reactive to light.  Neck: Normal range of motion. Neck supple. No thyromegaly present.  Cardiovascular: Normal rate, regular rhythm, normal heart sounds and intact distal pulses.   Pulmonary/Chest: Effort normal and breath sounds normal.  Abdominal: Soft. Bowel sounds are normal. She exhibits no mass. There is no tenderness.  Mild suprapubic tenderness  Musculoskeletal: Normal range of motion.  Lymphadenopathy:    She has no cervical adenopathy.  Neurological: She is alert and oriented to person, place, and time.  Skin: Skin is warm and  dry. No rash noted.  Psychiatric: She has a normal mood and affect. Her behavior is normal.          Assessment & Plan:   UTI.  Will treat with Cipro for 3 days Essential hypertension, stable Chronic kidney disease/C1q nephropathy.  Follow-up renal medicine in November as scheduled

## 2015-03-25 NOTE — Patient Instructions (Signed)
Drink as much fluid as you  can tolerate over the next few days  Take your antibiotic as prescribed until ALL of it is gone, but stop if you develop a rash, swelling, or any side effects of the medication.  Contact our office as soon as possible if  there are side effects of the medication.   

## 2015-03-28 ENCOUNTER — Encounter (HOSPITAL_COMMUNITY): Payer: Self-pay | Admitting: *Deleted

## 2015-03-28 ENCOUNTER — Emergency Department (HOSPITAL_COMMUNITY): Payer: BLUE CROSS/BLUE SHIELD

## 2015-03-28 ENCOUNTER — Emergency Department (HOSPITAL_COMMUNITY)
Admission: EM | Admit: 2015-03-28 | Discharge: 2015-03-29 | Disposition: A | Discharge status: Home / Self Care | Payer: BLUE CROSS/BLUE SHIELD | Attending: Emergency Medicine | Admitting: Emergency Medicine

## 2015-03-28 DIAGNOSIS — R0789 Other chest pain: Secondary | ICD-10-CM | POA: Insufficient documentation

## 2015-03-28 DIAGNOSIS — E079 Disorder of thyroid, unspecified: Secondary | ICD-10-CM | POA: Insufficient documentation

## 2015-03-28 DIAGNOSIS — Z79899 Other long term (current) drug therapy: Secondary | ICD-10-CM | POA: Diagnosis not present

## 2015-03-28 DIAGNOSIS — M546 Pain in thoracic spine: Secondary | ICD-10-CM | POA: Diagnosis not present

## 2015-03-28 DIAGNOSIS — R0602 Shortness of breath: Secondary | ICD-10-CM | POA: Insufficient documentation

## 2015-03-28 DIAGNOSIS — Z8639 Personal history of other endocrine, nutritional and metabolic disease: Secondary | ICD-10-CM | POA: Diagnosis not present

## 2015-03-28 DIAGNOSIS — R42 Dizziness and giddiness: Secondary | ICD-10-CM | POA: Insufficient documentation

## 2015-03-28 DIAGNOSIS — K219 Gastro-esophageal reflux disease without esophagitis: Secondary | ICD-10-CM | POA: Diagnosis not present

## 2015-03-28 DIAGNOSIS — I10 Essential (primary) hypertension: Secondary | ICD-10-CM | POA: Diagnosis not present

## 2015-03-28 DIAGNOSIS — R079 Chest pain, unspecified: Secondary | ICD-10-CM | POA: Diagnosis present

## 2015-03-28 DIAGNOSIS — Z87448 Personal history of other diseases of urinary system: Secondary | ICD-10-CM | POA: Insufficient documentation

## 2015-03-28 LAB — BASIC METABOLIC PANEL
ANION GAP: 5 (ref 5–15)
BUN: 21 mg/dL — ABNORMAL HIGH (ref 6–20)
CALCIUM: 9.5 mg/dL (ref 8.9–10.3)
CO2: 25 mmol/L (ref 22–32)
Chloride: 107 mmol/L (ref 101–111)
Creatinine, Ser: 1.25 mg/dL — ABNORMAL HIGH (ref 0.44–1.00)
GFR calc non Af Amer: 51 mL/min — ABNORMAL LOW (ref >60)
GFR, EST AFRICAN AMERICAN: 59 mL/min — AB (ref >60)
GLUCOSE: 98 mg/dL (ref 65–99)
Potassium: 4.2 mmol/L (ref 3.5–5.1)
SODIUM: 137 mmol/L (ref 135–145)

## 2015-03-28 LAB — CBC
HCT: 39.4 % (ref 36.0–46.0)
HEMOGLOBIN: 13 g/dL (ref 12.0–15.0)
MCH: 28.6 pg (ref 26.0–34.0)
MCHC: 33 g/dL (ref 30.0–36.0)
MCV: 86.8 fL (ref 78.0–100.0)
PLATELETS: 257 10*3/uL (ref 150–400)
RBC: 4.54 MIL/uL (ref 3.87–5.11)
RDW: 13.2 % (ref 11.5–15.5)
WBC: 7.4 10*3/uL (ref 4.0–10.5)

## 2015-03-28 LAB — I-STAT TROPONIN, ED: Troponin i, poc: 0 ng/mL (ref 0.00–0.08)

## 2015-03-28 LAB — D-DIMER, QUANTITATIVE: D-Dimer, Quant: 1.77 ug/mL-FEU — ABNORMAL HIGH (ref 0.00–0.48)

## 2015-03-28 MED ORDER — IOHEXOL 300 MG/ML  SOLN
100.0000 mL | Freq: Once | INTRAMUSCULAR | Status: AC | PRN
  Administered 2015-03-28: 100 mL via INTRAVENOUS

## 2015-03-28 MED ORDER — MORPHINE SULFATE 4 MG/ML IJ SOLN
4.0000 mg | Freq: Once | INTRAMUSCULAR | Status: AC
  Administered 2015-03-28: 4 mg via INTRAVENOUS
  Filled 2015-03-28: qty 1

## 2015-03-28 MED ORDER — CYCLOBENZAPRINE HCL 10 MG PO TABS: 10.0000 mg | ORAL_TABLET | Freq: Three times a day (TID) | ORAL | Status: DC | PRN

## 2015-03-28 MED ORDER — DIAZEPAM 5 MG/ML IJ SOLN
5.0000 mg | Freq: Once | INTRAMUSCULAR | Status: AC
  Administered 2015-03-28: 5 mg via INTRAVENOUS
  Filled 2015-03-28: qty 2

## 2015-03-28 NOTE — ED Provider Notes (Signed)
CSN: [id number]     Arrival date & time 03/28/15  1924 History   First MD Initiated Contact with Patient 03/28/15 1951     Chief Complaint  Patient presents with  . Chest Pain     (Consider location/radiation/quality/duration/timing/severity/associated sxs/prior Treatment) HPI Comments: 44 year old female past medical history of hypertension, hyperlipidemia, GERD, thyroid disease, nephropathy, PE, IBS and anxiety presenting with midsternal chest pain radiating to her back that was present when she woke up yesterday morning. Pain was intermittent in all day yesterday and throughout the day today until earlier this afternoon when it became constant. Pain described as someone stabbing her in the center of her chest through to her back, worse when laying flat and deep inspiration, relieved by sitting up. Admits to mild intermittent shortness of breath and lightheadedness. Denies extremity numbness, tingling or weakness, vision change, headache, confusion, speech changes, nausea, vomiting, diaphoresis. Developed a mild dry cough this morning. History of PE in 2008, denies history of hypercoagulable state and she does not know the reason behind having a PE. No recent long travel, surgery or trauma. Nonsmoker.  Patient is a 44 y.o. female presenting with chest pain. The history is provided by the patient.  Chest Pain Associated symptoms: back pain and shortness of breath     Past Medical History  Diagnosis Date  . Allergy   . Hypertension   . IBS (irritable bowel syndrome)   . Hyperlipemia   . Anxiety   . GERD (gastroesophageal reflux disease)   . Chronic constipation   . Thyroid disease   . Nephropathy   . Kidney disease    Past Surgical History  Procedure Laterality Date  . Abdominal hysterectomy  1998  . Cholecystectomy  1997  . Esophagogastroduodenoscopy    . Tubal ligation    . Thyroid surgery     Family History  Problem Relation Age of Onset  . Hypertension Father   . Coronary  artery disease Father   . Colon cancer Neg Hx   . Colon polyps Neg Hx   . Kidney disease Neg Hx   . Esophageal cancer Neg Hx   . Gallbladder disease Neg Hx   . Diabetes Father   . Diabetes Mother     Pre-diabetic   Social History  Substance Use Topics  . Smoking status: Never Smoker   . Smokeless tobacco: Never Used  . Alcohol Use: No   OB History    No data available     Review of Systems  Respiratory: Positive for shortness of breath.   Cardiovascular: Positive for chest pain.  Musculoskeletal: Positive for back pain.  Neurological: Positive for light-headedness.  All other systems reviewed and are negative.     Allergies  Aspirin; Dilaudid; Food; and Tramadol  Home Medications   Prior to Admission medications   Medication Sig Start Date End Date Taking? Authorizing Provider  alum & mag hydroxide-simeth (MAALOX/MYLANTA) 200-200-20 MG/5ML suspension Take 30 mLs by mouth every 6 (six) hours as needed for indigestion or heartburn.    Yes Historical Provider, MD  amLODipine (NORVASC) 5 MG tablet TAKE 1 TABLET BY MOUTH EVERY DAY 06/28/14  Yes Marletta Lor, MD  docusate sodium (COLACE) 100 MG capsule Take 100 mg by mouth daily.    Yes Historical Provider, MD  levothyroxine (SYNTHROID, LEVOTHROID) 150 MCG tablet TAKE 1 TABLET BY MOUTH EVERY DAY BEFORE BREAKFAST 01/03/15  Yes Marin Olp, MD  loratadine (CLARITIN) 10 MG tablet Take 10 mg by mouth daily.  Yes Historical Provider, MD  metoprolol succinate (TOPROL-XL) 50 MG 24 hr tablet TAKE 1 TABLET BY MOUTH EVERY DAY. TAKE IMMEDIATELY FOLLOWING A MEAL 06/28/14  Yes Marletta Lor, MD  Multiple Vitamin (MULTIVITAMIN) tablet Take 1 tablet by mouth daily.     Yes Historical Provider, MD  omeprazole (PRILOSEC) 40 MG capsule TAKE ONE CAPSULE BY MOUTH EVERY DAY 01/03/15  Yes Marletta Lor, MD  promethazine (PHENERGAN) 25 MG tablet Take 1 tablet (25 mg total) by mouth every 6 (six) hours as needed for nausea or  vomiting. 03/19/15  Yes Marletta Lor, MD  valsartan (DIOVAN) 160 MG tablet TAKE 1 TABLET BY MOUTH EVERY DAY 01/03/15  Yes Marin Olp, MD  albuterol (PROVENTIL HFA;VENTOLIN HFA) 108 (90 BASE) MCG/ACT inhaler Inhale 2 puffs into the lungs every 6 (six) hours as needed for wheezing or shortness of breath.    Historical Provider, MD  ALPRAZolam Duanne Moron) 0.25 MG tablet Take 1 tablet (0.25 mg total) by mouth 2 (two) times daily as needed for anxiety. 02/26/15   Marletta Lor, MD  ciprofloxacin (CIPRO) 500 MG tablet Take 1 tablet (500 mg total) by mouth 2 (two) times daily. Patient not taking: Reported on 03/28/2015 03/25/15   Marletta Lor, MD  cyclobenzaprine (FLEXERIL) 10 MG tablet Take 1 tablet (10 mg total) by mouth every 8 (eight) hours as needed for muscle spasms. 03/28/15   Shay Bartoli M Emilyanne Mcgough, PA-C  fluticasone (FLONASE) 50 MCG/ACT nasal spray Place 2 sprays into both nostrils daily. Patient not taking: Reported on 03/28/2015 08/01/14   Brand Males, MD  polyethylene glycol powder (GLYCOLAX/MIRALAX) powder Take 17 g by mouth 2 (two) times daily as needed. Patient not taking: Reported on 03/28/2015 12/17/14   Marletta Lor, MD   BP 135/81 mmHg  Pulse 81  Temp(Src) 98.5 F (36.9 C) (Oral)  Resp 19  Ht 5\' 5"  (1.651 m)  Wt 218 lb (98.884 kg)  BMI 36.28 kg/m2  SpO2 96% Physical Exam  Constitutional: She is oriented to person, place, and time. She appears well-developed and well-nourished. No distress.  HENT:  Head: Normocephalic and atraumatic.  Mouth/Throat: Oropharynx is clear and moist.  Eyes: Conjunctivae and EOM are normal. Pupils are equal, round, and reactive to light.  Neck: Normal range of motion. Neck supple. No JVD present.  Cardiovascular: Normal rate, regular rhythm, normal heart sounds and intact distal pulses.   No extremity edema.  Pulmonary/Chest: Effort normal and breath sounds normal. No respiratory distress. She exhibits tenderness (mid-sternal).   Abdominal: Soft. Bowel sounds are normal. There is no tenderness.  Musculoskeletal: Normal range of motion. She exhibits no edema.  Neurological: She is alert and oriented to person, place, and time. She has normal strength. No sensory deficit.  Speech fluent, goal oriented. Moves extremities without ataxia. Equal grip strength bilateral.  Skin: Skin is warm and dry. She is not diaphoretic.  Psychiatric: She has a normal mood and affect. Her behavior is normal.  Nursing note and vitals reviewed.   ED Course  Procedures (including critical care time) Labs Review Labs Reviewed  BASIC METABOLIC PANEL - Abnormal; Notable for the following:    BUN 21 (*)    Creatinine, Ser 1.25 (*)    GFR calc non Af Amer 51 (*)    GFR calc Af Amer 59 (*)    All other components within normal limits  D-DIMER, QUANTITATIVE (NOT AT [facility]) - Abnormal; Notable for the following:    D-Dimer, Quant 1.77 (*)  All other components within normal limits  CBC  I-STAT TROPOININ, ED    Imaging Review Dg Chest 2 View  03/28/2015   CLINICAL DATA:  Chest pain with shortness of breath and cough for 1 day  EXAM: CHEST  2 VIEW  COMPARISON:  July 30, 2014  FINDINGS: There is mild left lower lobe atelectatic change. No edema or consolidation. Heart size and pulmonary vascularity are normal. No adenopathy. There are surgical clips in the upper thoracic region midline. 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  IMPRESSION: Mild atelectatic change left lower lobe.  No edema or consolidation.   Electronically Signed   By: Lowella Grip III M.D.   On: 03/28/2015 20:21   Ct Angio Chest Pe W/cm &/or Wo Cm  03/28/2015   CLINICAL DATA:  One day history of substernal region chest pain. Mild shortness of breath  EXAM: CT ANGIOGRAPHY CHEST WITH CONTRAST  TECHNIQUE: Multidetector CT imaging of the chest was performed using the standard protocol during bolus administration of intravenous contrast. Multiplanar CT image reconstructions  and MIPs were obtained to evaluate the vascular anatomy.  CONTRAST:  173mL OMNIPAQUE IOHEXOL 300 MG/ML  SOLN  COMPARISON:  CT angiogram chest June 14, 2014; chest radiograph March 28, 2015  FINDINGS: There is no demonstrable pulmonary embolus. There is no thoracic aortic aneurysm or dissection. The visualized great vessels appear normal.  There is patchy bibasilar atelectasis, largely dependent in etiology. There is no edema or consolidation. There are surgical clips in the region of the thyroid. There is no appreciable thoracic adenopathy. Pericardium is not thickened.  In the visualized upper abdomen, no lesion is identified.  There are no blastic or lytic bone lesions.  Review of the MIP images confirms the above findings.  IMPRESSION: No demonstrable pulmonary embolus. Mild bibasilar atelectasis. No lung edema or consolidation. No appreciable adenopathy.   Electronically Signed   By: Lowella Grip III M.D.   On: 03/28/2015 22:58   I, Lucien Mons, personally reviewed and evaluated these images and lab results as part of my medical decision-making.   EKG Interpretation None      Date: 03/28/2015  Rate: 71  Rhythm: sinus rhythm  QRS Axis: normal  Intervals: normal  ST/T Wave abnormalities: Borderline 2 abnormalities, diffuse leads  Conduction Disutrbances: none  Narrative Interpretation: Sinus rhythm, borderline T abnormalities, diffuse leads, no significant changes since prior EKG  Old EKG Reviewed: No significant changes noted    MDM   Final diagnoses:  Chest wall pain  Midsternal chest pain   Nontoxic appearing, NAD. AF VSS. Labs, chest x-ray pending. Cannot rule out PE. Lower suspicion for cardiac. Doubt dissection. May also possibly be reflux symptoms given history of GERD.  D-dimer elevated. Will obtain CT angio.  CT angio negative for acute finding. Pain had initially improved with morphine and started to return. Pain is reproducible and states that is the pain she is  experiencing. Will give muscle relaxant as pt cannot have NSAIDs due to nephropothy. Doubt cardiac. Pt stable for d/c. F/u with PCP. Rx flexeril. Return precautions given. Patient states understanding of treatment care plan and is agreeable.  Carman Ching, PA-C 03/28/15 [id number]  Merrily Pew, MD 03/29/15 [phone]

## 2015-03-28 NOTE — ED Notes (Signed)
Patient transported to X-ray 

## 2015-03-28 NOTE — ED Notes (Signed)
Pt states that she woke up with substernal chest pain yesterday morning that radiates through to her back; pt states that is feels like someone is stabbing her; pt c/o mild shortness of breath at times and intermittent lighteheadness

## 2015-03-28 NOTE — ED Notes (Signed)
MD at bedside. 

## 2015-03-28 NOTE — Discharge Instructions (Signed)
No driving or operating heavy machinery while taking flexeril. This medication may make you drowsy. Take tylenol for pain.  Chest Wall Pain Chest wall pain is pain in or around the bones and muscles of your chest. It may take up to 6 weeks to get better. It may take longer if you must stay physically active in your work and activities.  CAUSES  Chest wall pain may happen on its own. However, it may be caused by:  A viral illness like the flu.  Injury.  Coughing.  Exercise.  Arthritis.  Fibromyalgia.  Shingles. HOME CARE INSTRUCTIONS   Avoid overtiring physical activity. Try not to strain or perform activities that cause pain. This includes any activities using your chest or your abdominal and side muscles, especially if heavy weights are used.  Put ice on the sore area.  Put ice in a plastic bag.  Place a towel between your skin and the bag.  Leave the ice on for 15-20 minutes per hour while awake for the first 2 days.  Only take over-the-counter or prescription medicines for pain, discomfort, or fever as directed by your caregiver. SEEK IMMEDIATE MEDICAL CARE IF:   Your pain increases, or you are very uncomfortable.  You have a fever.  Your chest pain becomes worse.  You have new, unexplained symptoms.  You have nausea or vomiting.  You feel sweaty or lightheaded.  You have a cough with phlegm (sputum), or you cough up blood. MAKE SURE YOU:   Understand these instructions.  Will watch your condition.  Will get help right away if you are not doing well or get worse. Document Released: 08/03/2005 Document Revised: 10/26/2011 Document Reviewed: 03/30/2011 [facility] Patient Information 2015 [place], [place]. This information is not intended to replace advice given to you by your health care provider. Make sure you discuss any questions you have with your health care provider.  Chest Pain (Nonspecific) It is often hard to give a specific diagnosis for the cause  of chest pain. There is always a chance that your pain could be related to something serious, such as a heart attack or a blood clot in the lungs. You need to follow up with your health care provider for further evaluation. CAUSES   Heartburn.  Pneumonia or bronchitis.  Anxiety or stress.  Inflammation around your heart (pericarditis) or lung (pleuritis or pleurisy).  A blood clot in the lung.  A collapsed lung (pneumothorax). It can develop suddenly on its own (spontaneous pneumothorax) or from trauma to the chest.  Shingles infection (herpes zoster virus). The chest wall is composed of bones, muscles, and cartilage. Any of these can be the source of the pain.  The bones can be bruised by injury.  The muscles or cartilage can be strained by coughing or overwork.  The cartilage can be affected by inflammation and become sore (costochondritis). DIAGNOSIS  Lab tests or other studies may be needed to find the cause of your pain. Your health care provider may have you take a test called an ambulatory electrocardiogram (ECG). An ECG records your heartbeat patterns over a 24-hour period. You may also have other tests, such as:  Transthoracic echocardiogram (TTE). During echocardiography, sound waves are used to evaluate how blood flows through your heart.  Transesophageal echocardiogram (TEE).  Cardiac monitoring. This allows your health care provider to monitor your heart rate and rhythm in real time.  Holter monitor. This is a portable device that records your heartbeat and can help diagnose heart arrhythmias.  It allows your health care provider to track your heart activity for several days, if needed.  Stress tests by exercise or by giving medicine that makes the heart beat faster. TREATMENT   Treatment depends on what may be causing your chest pain. Treatment may include:  Acid blockers for heartburn.  Anti-inflammatory medicine.  Pain medicine for inflammatory  conditions.  Antibiotics if an infection is present.  You may be advised to change lifestyle habits. This includes stopping smoking and avoiding alcohol, caffeine, and chocolate.  You may be advised to keep your head raised (elevated) when sleeping. This reduces the chance of acid going backward from your stomach into your esophagus. Most of the time, nonspecific chest pain will improve within 2-3 days with rest and mild pain medicine.  HOME CARE INSTRUCTIONS   If antibiotics were prescribed, take them as directed. Finish them even if you start to feel better.  For the next few days, avoid physical activities that bring on chest pain. Continue physical activities as directed.  Do not use any tobacco products, including cigarettes, chewing tobacco, or electronic cigarettes.  Avoid drinking alcohol.  Only take medicine as directed by your health care provider.  Follow your health care provider's suggestions for further testing if your chest pain does not go away.  Keep any follow-up appointments you made. If you do not go to an appointment, you could develop lasting (chronic) problems with pain. If there is any problem keeping an appointment, call to reschedule. SEEK MEDICAL CARE IF:   Your chest pain does not go away, even after treatment.  You have a rash with blisters on your chest.  You have a fever. SEEK IMMEDIATE MEDICAL CARE IF:   You have increased chest pain or pain that spreads to your arm, neck, jaw, back, or abdomen.  You have shortness of breath.  You have an increasing cough, or you cough up blood.  You have severe back or abdominal pain.  You feel nauseous or vomit.  You have severe weakness.  You faint.  You have chills. This is an emergency. Do not wait to see if the pain will go away. Get medical help at once. Call your local emergency services (911 in [place] Do not drive yourself to the hospital. MAKE SURE YOU:   Understand these  instructions.  Will watch your condition.  Will get help right away if you are not doing well or get worse. Document Released: 05/13/2005 Document Revised: 08/08/2013 Document Reviewed: 03/08/2008 [facility] Patient Information 2015 [place], [place]. This information is not intended to replace advice given to you by your health care provider. Make sure you discuss any questions you have with your health care provider.

## 2015-03-28 NOTE — ED Notes (Signed)
Patient transported to CT 

## 2015-04-02 ENCOUNTER — Encounter: Payer: Self-pay | Admitting: Internal Medicine

## 2015-04-02 ENCOUNTER — Ambulatory Visit (INDEPENDENT_AMBULATORY_CARE_PROVIDER_SITE_OTHER): Payer: BLUE CROSS/BLUE SHIELD | Admitting: Internal Medicine

## 2015-04-02 VITALS — BP 110/76 | HR 81 | Temp 98.5°F | Resp 20 | Ht 65.0 in | Wt 218.0 lb

## 2015-04-02 DIAGNOSIS — I10 Essential (primary) hypertension: Secondary | ICD-10-CM

## 2015-04-02 DIAGNOSIS — E039 Hypothyroidism, unspecified: Secondary | ICD-10-CM | POA: Diagnosis not present

## 2015-04-02 DIAGNOSIS — R079 Chest pain, unspecified: Secondary | ICD-10-CM | POA: Diagnosis not present

## 2015-04-02 MED ORDER — PREDNISONE 10 MG PO TABS: 10.0000 mg | ORAL_TABLET | Freq: Two times a day (BID) | ORAL | Status: DC

## 2015-04-02 NOTE — Progress Notes (Signed)
Subjective:    Patient ID: Rhonda [name], female    DOB: 12-20-1970, 44 y.o.   MRN: [id number]  HPI 44 year old patient who is seen today following a recent ED visit.  She's had a seven-day history of chest pain.  She describes a pain in the mid chest area that radiates to the back is described as a sharp shooting pain.  Pain is aggravated by deep inspiration and often worse at night in the supine position.  She states the pain is sometimes aggravated by activity.  No relationship to intake of food. She does have a remote history of a pulmonary embolism.  D-dimer was slightly elevated and CTA was performed that revealed normal findings.  She was given a muscle relaxer that she has not taken.  She tried to return to work yesterday but went home due to worsening pain. She has treated hypertension and chronic kidney disease.  Past Medical History  Diagnosis Date  . Allergy   . Hypertension   . IBS (irritable bowel syndrome)   . Hyperlipemia   . Anxiety   . GERD (gastroesophageal reflux disease)   . Chronic constipation   . Thyroid disease   . Nephropathy   . Kidney disease     Social History   Social History  . Marital Status: Divorced    Spouse Name: N/A  . Number of Children: 2  . Years of Education: N/A   Occupational History  . NURSING ASSISTANT    Social History Main Topics  . Smoking status: Never Smoker   . Smokeless tobacco: Never Used  . Alcohol Use: No  . Drug Use: No  . Sexual Activity: Yes    Birth Control/ Protection: Surgical   Other Topics Concern  . Not on file   Social History Narrative    Past Surgical History  Procedure Laterality Date  . Abdominal hysterectomy  1998  . Cholecystectomy  1997  . Esophagogastroduodenoscopy    . Tubal ligation    . Thyroid surgery      Family History  Problem Relation Age of Onset  . Hypertension Father   . Coronary artery disease Father   . Colon cancer Neg Hx   . Colon polyps Neg Hx   . Kidney disease  Neg Hx   . Esophageal cancer Neg Hx   . Gallbladder disease Neg Hx   . Diabetes Father   . Diabetes Mother     Pre-diabetic    Allergies  Allergen Reactions  . Aspirin Hives  . Dilaudid [Hydromorphone] Itching  . Food Other (See Comments)    Lima beans  . Tramadol Other (See Comments)    Headache     Current Outpatient Prescriptions on File Prior to Visit  Medication Sig Dispense Refill  . albuterol (PROVENTIL HFA;VENTOLIN HFA) 108 (90 BASE) MCG/ACT inhaler Inhale 2 puffs into the lungs every 6 (six) hours as needed for wheezing or shortness of breath.    . ALPRAZolam (XANAX) 0.25 MG tablet Take 1 tablet (0.25 mg total) by mouth 2 (two) times daily as needed for anxiety. 60 tablet 0  . alum & mag hydroxide-simeth (MAALOX/MYLANTA) 200-200-20 MG/5ML suspension Take 30 mLs by mouth every 6 (six) hours as needed for indigestion or heartburn.     Marland Kitchen amLODipine (NORVASC) 5 MG tablet TAKE 1 TABLET BY MOUTH EVERY DAY 30 tablet 11  . cyclobenzaprine (FLEXERIL) 10 MG tablet Take 1 tablet (10 mg total) by mouth every 8 (eight) hours as needed for muscle spasms.  10 tablet 0  . docusate sodium (COLACE) 100 MG capsule Take 100 mg by mouth daily.     . fluticasone (FLONASE) 50 MCG/ACT nasal spray Place 2 sprays into both nostrils daily. 16 g 2  . levothyroxine (SYNTHROID, LEVOTHROID) 150 MCG tablet TAKE 1 TABLET BY MOUTH EVERY DAY BEFORE BREAKFAST 30 tablet 5  . loratadine (CLARITIN) 10 MG tablet Take 10 mg by mouth daily.    . metoprolol succinate (TOPROL-XL) 50 MG 24 hr tablet TAKE 1 TABLET BY MOUTH EVERY DAY. TAKE IMMEDIATELY FOLLOWING A MEAL 30 tablet 11  . Multiple Vitamin (MULTIVITAMIN) tablet Take 1 tablet by mouth daily.      Marland Kitchen omeprazole (PRILOSEC) 40 MG capsule TAKE ONE CAPSULE BY MOUTH EVERY DAY 30 capsule 0  . polyethylene glycol powder (GLYCOLAX/MIRALAX) powder Take 17 g by mouth 2 (two) times daily as needed. 3350 g 1  . promethazine (PHENERGAN) 25 MG tablet Take 1 tablet (25 mg total)  by mouth every 6 (six) hours as needed for nausea or vomiting. 30 tablet 0  . valsartan (DIOVAN) 160 MG tablet TAKE 1 TABLET BY MOUTH EVERY DAY 30 tablet 5   No current facility-administered medications on file prior to visit.    BP 110/76 mmHg  Pulse 81  Temp(Src) 98.5 F (36.9 C) (Oral)  Resp 20  Ht 5\' 5"  (1.651 m)  Wt 218 lb (98.884 kg)  BMI 36.28 kg/m2  SpO2 99%      Review of Systems  Constitutional: Negative.   HENT: Negative for congestion, dental problem, hearing loss, rhinorrhea, sinus pressure, sore throat and tinnitus.   Eyes: Negative for pain, discharge and visual disturbance.  Respiratory: Negative for cough and shortness of breath.   Cardiovascular: Positive for chest pain. Negative for palpitations and leg swelling.  Gastrointestinal: Negative for nausea, vomiting, abdominal pain, diarrhea, constipation, blood in stool and abdominal distention.  Genitourinary: Negative for dysuria, urgency, frequency, hematuria, flank pain, vaginal bleeding, vaginal discharge, difficulty urinating, vaginal pain and pelvic pain.  Musculoskeletal: Positive for back pain. Negative for joint swelling, arthralgias and gait problem.  Skin: Negative for rash.  Neurological: Negative for dizziness, syncope, speech difficulty, weakness, numbness and headaches.  Hematological: Negative for adenopathy.  Psychiatric/Behavioral: Negative for behavioral problems, dysphoric mood and agitation. The patient is not nervous/anxious.        Objective:   Physical Exam  Constitutional: She is oriented to person, place, and time. She appears well-developed and well-nourished. No distress.  Blood pressure normal Pulse, normal O2 saturation 99%   HENT:  Head: Normocephalic.  Right Ear: External ear normal.  Left Ear: External ear normal.  Mouth/Throat: Oropharynx is clear and moist.  Eyes: Conjunctivae and EOM are normal. Pupils are equal, round, and reactive to light.  Neck: Normal range of  motion. Neck supple. No thyromegaly present.  Cardiovascular: Normal rate, regular rhythm, normal heart sounds and intact distal pulses.  Exam reveals no friction rub.   Pulmonary/Chest: Effort normal and breath sounds normal. No respiratory distress. She has no wheezes. She has no rales.  Abdominal: Soft. Bowel sounds are normal. She exhibits no mass. There is no tenderness.  Musculoskeletal: Normal range of motion. She exhibits no edema.  Lymphadenopathy:    She has no cervical adenopathy.  Neurological: She is alert and oriented to person, place, and time.  Skin: Skin is warm and dry. No rash noted.  Psychiatric: She has a normal mood and affect. Her behavior is normal.  Assessment & Plan:   Nonspecific chest pain History of chronic kidney disease Remote history of PE History of gastroesophageal reflux disease  Due to her chronic kidney disease.  We'll attempt to avoid nonsteroidal anti-inflammatory drugs.  Will treat with oral prednisone for 7 days.  Will report any new or worsening symptoms Hypertension, stable

## 2015-04-02 NOTE — Patient Instructions (Signed)
Avoids foods high in acid such as tomatoes citrus juices, and spicy foods.  Avoid eating within two hours of lying down or before exercising.  Do not overheat.  Try smaller more frequent meals.  If symptoms persist, elevate the head of her bed 12 inches while sleeping.  Please check your blood pressure on a regular basis.  If it is consistently greater than 150/90, please make an office appointment.  Report any new or worsening symptoms

## 2015-04-02 NOTE — Progress Notes (Signed)
Pre visit review using our clinic review tool, if applicable. No additional management support is needed unless otherwise documented below in the visit note. 

## 2015-04-13 ENCOUNTER — Ambulatory Visit (INDEPENDENT_AMBULATORY_CARE_PROVIDER_SITE_OTHER): Payer: BLUE CROSS/BLUE SHIELD | Admitting: Family Medicine

## 2015-04-13 ENCOUNTER — Encounter: Payer: Self-pay | Admitting: Family Medicine

## 2015-04-13 VITALS — BP 138/90 | HR 84 | Temp 98.5°F | Ht 65.0 in | Wt 218.0 lb

## 2015-04-13 DIAGNOSIS — N898 Other specified noninflammatory disorders of vagina: Secondary | ICD-10-CM | POA: Diagnosis not present

## 2015-04-13 DIAGNOSIS — N760 Acute vaginitis: Secondary | ICD-10-CM

## 2015-04-13 MED ORDER — METRONIDAZOLE 0.75 % VA GEL: 1.0000 | Freq: Every day | VAGINAL | Status: DC

## 2015-04-13 NOTE — Patient Instructions (Signed)
We'll sent the swab over to the labs.  Start the medicine in the meantime.  Take care.  Glad to see you.

## 2015-04-13 NOTE — Assessment & Plan Note (Signed)
Wet prep pending.  Likely BV based on hx.  Start vaginal flagyl, she can tolerate that.  D/w pt.  F/u prn.  She agrees.

## 2015-04-13 NOTE — Progress Notes (Signed)
Pre visit review using our clinic review tool, if applicable. No additional management support is needed unless otherwise documented below in the visit note.  Vaginal discharge.  Itching and irritation.  Started a few days ago.  Discharge is milky, thin.  Not thick, not cottage cheese consistency.  No odor. No FCNAVD.  No VB. S/p hysterectomy.  H/o BV in the past that was similar.  She can't tolerate oral flagyl.    Recently with some mild facial pressure, unclear if allergy related.  Feels like sinus pressure.  No other URI sx.    Meds, vitals, and allergies reviewed.   ROS: See HPI.  Otherwise, noncontributory.  GEN: nad, alert and oriented HEENT: mucous membranes moist, OP wnl, TM and nasal exam wnl NECK: supple w/o LA CV: rrr.  PULM: ctab, no inc wob ABD: soft, +bs Pelvic exam deferred.   Wet prep pending.

## 2015-04-15 ENCOUNTER — Telehealth: Payer: Self-pay | Admitting: Radiology

## 2015-04-15 NOTE — Telephone Encounter (Signed)
The Cytology Dept @ [facility] called, They received a wet prep in a pap smear collection container. Wet prep cannot be done on this sample. Per Dr Damita Dunnings cancel the wet prep.

## 2015-04-16 NOTE — Telephone Encounter (Signed)
I need a safety zone done for this. It was supposed to be a wet prep and it should have been sent in a plain glass test tube or similar container w/o any specific preservative.  Please let me know when this is done.  The patient needs to be contacted. If you prefer me to call her, then please let me know.  I need to know how she is feeling, if she is better, and she needs to know that we don't have any extra information to provide her.  Thanks.

## 2015-04-16 NOTE — Telephone Encounter (Signed)
Patient returned Lugene's call.  Please call patient back at [phone].

## 2015-04-16 NOTE — Telephone Encounter (Signed)
Steffanie, CMA who did the wet prep incorrectly will do the SZP per her manager, Lebron Conners.  Her manager will be sure she knows how to handle wet prep specimens in the future. Lugene, please call the patient to see if she is better and let her know we have no additional information to provide to her.

## 2015-04-16 NOTE — Telephone Encounter (Addendum)
I would use OTC monistat and f/u with PCP if not resolved. Thanks.

## 2015-04-16 NOTE — Telephone Encounter (Signed)
I thank all involved. 

## 2015-04-16 NOTE — Telephone Encounter (Signed)
Left detailed message on voicemail as requested from patient at previous conversation.

## 2015-04-16 NOTE — Telephone Encounter (Signed)
Patient states she is using the Flagyl gel but is now experiencing a thick, cottage-cheese sort of discharge.  Is this indication of a different sort of infection?

## 2015-04-16 NOTE — Telephone Encounter (Signed)
Left detailed message on voicemail.  

## 2015-04-17 ENCOUNTER — Telehealth: Payer: Self-pay | Admitting: Internal Medicine

## 2015-04-17 MED ORDER — FLUCONAZOLE 150 MG PO TABS: 150.0000 mg | ORAL_TABLET | Freq: Once | ORAL | Status: DC

## 2015-04-17 NOTE — Telephone Encounter (Signed)
Pt [facility] dr [facility] and now has vaginal infection and would like [place] [facility]

## 2015-04-17 NOTE — Telephone Encounter (Signed)
Please see message and advise 

## 2015-04-17 NOTE — Telephone Encounter (Signed)
Diflucan 150 #1 one-time dose

## 2015-04-17 NOTE — Telephone Encounter (Signed)
Pt notified Rx for Diflucan 1 tablet sent to pharmacy.

## 2015-04-23 ENCOUNTER — Telehealth: Payer: Self-pay | Admitting: Internal Medicine

## 2015-04-23 MED ORDER — FLUCONAZOLE 150 MG PO TABS: 150.0000 mg | ORAL_TABLET | Freq: Once | ORAL | Status: DC

## 2015-04-23 NOTE — Telephone Encounter (Signed)
Pt need another round of diflucan call into [facility] [address]

## 2015-04-23 NOTE — Telephone Encounter (Signed)
ok 

## 2015-04-23 NOTE — Telephone Encounter (Signed)
Left message Rx sent to pharmacy. 

## 2015-04-23 NOTE — Telephone Encounter (Signed)
Okay to refill Diflucan one time again, pt still having symptoms.

## 2015-05-23 ENCOUNTER — Telehealth: Payer: Self-pay | Admitting: Internal Medicine

## 2015-05-23 MED ORDER — PROMETHAZINE HCL 25 MG PO TABS: 25.0000 mg | ORAL_TABLET | Freq: Four times a day (QID) | ORAL | Status: DC | PRN

## 2015-05-23 NOTE — Telephone Encounter (Signed)
Left message on personal voicemail Rx sent to pharmacy. 

## 2015-05-23 NOTE — Telephone Encounter (Signed)
Pt request refill of the following:  promethazine (PHENERGAN) 25 MG tablet   Phamacy:  [place]

## 2015-06-06 ENCOUNTER — Other Ambulatory Visit (INDEPENDENT_AMBULATORY_CARE_PROVIDER_SITE_OTHER): Payer: BLUE CROSS/BLUE SHIELD

## 2015-06-06 DIAGNOSIS — Z0000 Encounter for general adult medical examination without abnormal findings: Secondary | ICD-10-CM | POA: Diagnosis not present

## 2015-06-06 LAB — BASIC METABOLIC PANEL
BUN: 12 mg/dL (ref 6–23)
CO2: 30 mEq/L (ref 19–32)
Calcium: 9.8 mg/dL (ref 8.4–10.5)
Chloride: 104 mEq/L (ref 96–112)
Creatinine, Ser: 1.09 mg/dL (ref 0.40–1.20)
GFR: 69.39 mL/min (ref >60.00)
Glucose, Bld: 103 mg/dL — ABNORMAL HIGH (ref 70–99)
Potassium: 4.3 mEq/L (ref 3.5–5.1)
Sodium: 140 mEq/L (ref 135–145)

## 2015-06-06 LAB — POCT URINALYSIS DIPSTICK
Bilirubin, UA: NEGATIVE
Blood, UA: NEGATIVE
Glucose, UA: NEGATIVE
Ketones, UA: NEGATIVE
Leukocytes, UA: NEGATIVE
Nitrite, UA: NEGATIVE
Spec Grav, UA: >=1.03
Urobilinogen, UA: 0.2
pH, UA: 6

## 2015-06-06 LAB — CBC WITH DIFFERENTIAL/PLATELET
Basophils Absolute: 0 10*3/uL (ref 0.0–0.1)
Basophils Relative: 0.6 % (ref 0.0–3.0)
Eosinophils Absolute: 0.4 10*3/uL (ref 0.0–0.7)
Eosinophils Relative: 5 % (ref 0.0–5.0)
HCT: 39.8 % (ref 36.0–46.0)
Hemoglobin: 13.2 g/dL (ref 12.0–15.0)
Lymphocytes Relative: 31.8 % (ref 12.0–46.0)
Lymphs Abs: 2.4 10*3/uL (ref 0.7–4.0)
MCHC: 33 g/dL (ref 30.0–36.0)
MCV: 85 fl (ref 78.0–100.0)
Monocytes Absolute: 0.7 10*3/uL (ref 0.1–1.0)
Monocytes Relative: 9.5 % (ref 3.0–12.0)
Neutro Abs: 4 10*3/uL (ref 1.4–7.7)
Neutrophils Relative %: 53.1 % (ref 43.0–77.0)
Platelets: 295 10*3/uL (ref 150.0–400.0)
RBC: 4.69 Mil/uL (ref 3.87–5.11)
RDW: 13.7 % (ref 11.5–15.5)
WBC: 7.5 10*3/uL (ref 4.0–10.5)

## 2015-06-06 LAB — HEPATIC FUNCTION PANEL
ALT: 19 U/L (ref 0–35)
AST: 18 U/L (ref 0–37)
Albumin: 3.6 g/dL (ref 3.5–5.2)
Alkaline Phosphatase: 82 U/L (ref 39–117)
Bilirubin, Direct: 0.1 mg/dL (ref 0.0–0.3)
Total Bilirubin: 0.4 mg/dL (ref 0.2–1.2)
Total Protein: 7.7 g/dL (ref 6.0–8.3)

## 2015-06-06 LAB — LIPID PANEL
CHOLESTEROL: 196 mg/dL (ref 0–200)
HDL: 41.1 mg/dL (ref >39.00)
LDL CALC: 130 mg/dL — AB (ref 0–99)
NonHDL: 155.38
TRIGLYCERIDES: 126 mg/dL (ref 0.0–149.0)
Total CHOL/HDL Ratio: 5
VLDL: 25.2 mg/dL (ref 0.0–40.0)

## 2015-06-06 LAB — TSH: TSH: 1.55 u[IU]/mL (ref 0.35–4.50)

## 2015-06-13 ENCOUNTER — Encounter: Payer: BLUE CROSS/BLUE SHIELD | Admitting: Internal Medicine

## 2015-06-14 ENCOUNTER — Encounter: Payer: Self-pay | Admitting: Internal Medicine

## 2015-06-14 ENCOUNTER — Ambulatory Visit (INDEPENDENT_AMBULATORY_CARE_PROVIDER_SITE_OTHER): Payer: BLUE CROSS/BLUE SHIELD | Admitting: Internal Medicine

## 2015-06-14 VITALS — BP 142/90 | HR 56 | Temp 98.3°F | Resp 20 | Ht 65.0 in | Wt 218.0 lb

## 2015-06-14 DIAGNOSIS — I10 Essential (primary) hypertension: Secondary | ICD-10-CM | POA: Diagnosis not present

## 2015-06-14 DIAGNOSIS — Z0000 Encounter for general adult medical examination without abnormal findings: Secondary | ICD-10-CM

## 2015-06-14 DIAGNOSIS — Z86711 Personal history of pulmonary embolism: Secondary | ICD-10-CM

## 2015-06-14 DIAGNOSIS — N182 Chronic kidney disease, stage 2 (mild): Secondary | ICD-10-CM | POA: Diagnosis not present

## 2015-06-14 DIAGNOSIS — E785 Hyperlipidemia, unspecified: Secondary | ICD-10-CM | POA: Diagnosis not present

## 2015-06-14 DIAGNOSIS — E039 Hypothyroidism, unspecified: Secondary | ICD-10-CM

## 2015-06-14 MED ORDER — ALPRAZOLAM 0.25 MG PO TABS: 0.2500 mg | ORAL_TABLET | Freq: Two times a day (BID) | ORAL | Status: DC | PRN

## 2015-06-14 MED ORDER — LEVOTHYROXINE SODIUM 150 MCG PO TABS: ORAL_TABLET | ORAL | Status: DC

## 2015-06-14 MED ORDER — ALBUTEROL SULFATE HFA 108 (90 BASE) MCG/ACT IN AERS: 2.0000 | INHALATION_SPRAY | Freq: Four times a day (QID) | RESPIRATORY_TRACT | Status: DC | PRN

## 2015-06-14 MED ORDER — METOPROLOL SUCCINATE ER 50 MG PO TB24: ORAL_TABLET | ORAL | Status: DC

## 2015-06-14 MED ORDER — OMEPRAZOLE 40 MG PO CPDR: 40.0000 mg | DELAYED_RELEASE_CAPSULE | Freq: Every day | ORAL | Status: DC

## 2015-06-14 MED ORDER — AMLODIPINE BESYLATE 5 MG PO TABS: 5.0000 mg | ORAL_TABLET | Freq: Every day | ORAL | Status: DC

## 2015-06-14 MED ORDER — VALSARTAN 160 MG PO TABS: 160.0000 mg | ORAL_TABLET | Freq: Every day | ORAL | Status: DC

## 2015-06-14 NOTE — Progress Notes (Signed)
Subjective:    Patient ID: Yaqueline Gutter, female    DOB: 1970-09-15, 44 y.o.   MRN: [id number]  HPI    Subjective:    Patient ID: Nhyla Nappi, female    DOB: [birth year], 44 y.o.   MRN: [id number]  HPI 63 -year-old patient who is seen today for a preventive health examination.      Past Medical History  Diagnosis Date  . Allergy   . Hypertension   . IBS (irritable bowel syndrome)   . Hyperlipemia   . Anxiety   . GERD (gastroesophageal reflux disease)   . Chronic constipation   . Thyroid disease   . Nephropathy   . Kidney disease     Social History   Social History  . Marital Status: Divorced    Spouse Name: N/A  . Number of Children: 2  . Years of Education: N/A   Occupational History  . NURSING ASSISTANT    Social History Main Topics  . Smoking status: Never Smoker   . Smokeless tobacco: Never Used  . Alcohol Use: No  . Drug Use: No  . Sexual Activity: Yes    Birth Control/ Protection: Surgical   Other Topics Concern  . Not on file   Social History Narrative    Past Surgical History  Procedure Laterality Date  . Abdominal hysterectomy  1998  . Cholecystectomy  1997  . Esophagogastroduodenoscopy    . Tubal ligation    . Thyroid surgery      Family History  Problem Relation Age of Onset  . Hypertension Father   . Coronary artery disease Father   . Colon cancer Neg Hx   . Colon polyps Neg Hx   . Kidney disease Neg Hx   . Esophageal cancer Neg Hx   . Gallbladder disease Neg Hx   . Diabetes Father   . Diabetes Mother     Pre-diabetic    Allergies  Allergen Reactions  . Aspirin Hives  . Dilaudid [Hydromorphone] Itching  . Flagyl [Metronidazole] Nausea And Vomiting    With oral flagyl.   Has tolerated vaginal flagyl.    . Food Other (See Comments)    Lima beans  . Tramadol Other (See Comments)    Headache     Current Outpatient Prescriptions on File Prior to Visit  Medication Sig Dispense Refill  . alum & mag  hydroxide-simeth (MAALOX/MYLANTA) 200-200-20 MG/5ML suspension Take 30 mLs by mouth every 6 (six) hours as needed for indigestion or heartburn.     . cyclobenzaprine (FLEXERIL) 10 MG tablet Take 1 tablet (10 mg total) by mouth every 8 (eight) hours as needed for muscle spasms. 10 tablet 0  . docusate sodium (COLACE) 100 MG capsule Take 100 mg by mouth daily.     Marland Kitchen loratadine (CLARITIN) 10 MG tablet Take 10 mg by mouth daily.    . Multiple Vitamin (MULTIVITAMIN) tablet Take 1 tablet by mouth daily.      . promethazine (PHENERGAN) 25 MG tablet Take 1 tablet (25 mg total) by mouth every 6 (six) hours as needed for nausea or vomiting. 30 tablet 0   No current facility-administered medications on file prior to visit.    BP 142/90 mmHg  Pulse 56  Temp(Src) 98.3 F (36.8 C) (Oral)  Resp 20  Ht 5\' 5"  (1.651 m)  Wt 218 lb (98.884 kg)  BMI 36.28 kg/m2  SpO2 97%      Review of Systems  Constitutional: Negative for fever, appetite change, fatigue and  unexpected weight change.  HENT: Negative for congestion, dental problem, ear pain, hearing loss, mouth sores, nosebleeds, sinus pressure, sore throat, tinnitus, trouble swallowing and voice change.   Eyes: Negative for photophobia, pain, redness and visual disturbance.  Respiratory: Negative for cough, chest tightness and shortness of breath.   Cardiovascular: Negative for chest pain, palpitations and leg swelling.  Gastrointestinal: Negative for nausea, vomiting, abdominal pain, diarrhea, constipation, blood in stool, abdominal distention and rectal pain.  Genitourinary: Positive for dysuria and frequency. Negative for urgency, hematuria, flank pain, vaginal bleeding, vaginal discharge, difficulty urinating, genital sores, vaginal pain, menstrual problem and pelvic pain.  Musculoskeletal: Negative for arthralgias, back pain and neck stiffness.  Skin: Negative for rash.  Neurological: Negative for dizziness, syncope, speech difficulty, weakness,  light-headedness, numbness and headaches.  Hematological: Negative for adenopathy. Does not bruise/bleed easily.  Psychiatric/Behavioral: Negative for suicidal ideas, behavioral problems, self-injury, dysphoric mood and agitation. The patient is not nervous/anxious.        Objective:   Physical Exam  Constitutional: She is oriented to person, place, and time. She appears well-developed and well-nourished.  HENT:  Head: Normocephalic and atraumatic.  Right Ear: External ear normal.  Left Ear: External ear normal.  Mouth/Throat: Oropharynx is clear and moist.  Eyes: Conjunctivae and EOM are normal.  Neck: Normal range of motion. Neck supple. No JVD present. No thyromegaly present.  Cardiovascular: Normal rate, regular rhythm, normal heart sounds and intact distal pulses.   No murmur heard. Pulmonary/Chest: Effort normal and breath sounds normal. She has no wheezes. She has no rales.  Abdominal: Soft. Bowel sounds are normal. She exhibits no distension and no mass. There is no tenderness. There is no rebound and no guarding.  Musculoskeletal: Normal range of motion. She exhibits no edema and no tenderness.  Neurological: She is alert and oriented to person, place, and time. She has normal reflexes. No cranial nerve deficit. She exhibits normal muscle tone. Coordination normal.  Skin: Skin is warm and dry. No rash noted.  Psychiatric: She has a normal mood and affect. Her behavior is normal.          Assessment & Plan:   Preventive health examination Chronic kidney disease.  Followup with nephrology, and yearly Hypertension well controlled Hypothyroidism.  We'll check TSH Gastroesophageal reflux disease.  Continue omeprazole    Review of Systems  As above    Objective:   Physical Exam   As above     Assessment & Plan:   Preventive health examination Chronic kidney disease.  This is stable and creatinine and GFR are now within a normal range.  Follow-up annually with  nephrology  Essential hypertension, stable Allergic rhinitis.  Hypothyroidism.  TSH reviewed we'll continue present levothyroxine dose gastro-soft, reflux disease, stable Chronic constipation  Recheck 6 months

## 2015-06-14 NOTE — Patient Instructions (Signed)
Limit your sodium (Salt) intake  Please check your blood pressure on a regular basis.  If it is consistently greater than 150/90, please make an office appointment.    It is important that you exercise regularly, at least 20 minutes 3 to 4 times per week.  If you develop chest pain or shortness of breath seek  medical attention.  Return in 6 months for follow-up  Health Maintenance, Female Adopting a healthy lifestyle and getting preventive care can go a long way to promote health and wellness. Talk with your health care provider about what schedule of regular examinations is right for you. This is a good chance for you to check in with your provider about disease prevention and staying healthy. In between checkups, there are plenty of things you can do on your own. Experts have done a lot of research about which lifestyle changes and preventive measures are most likely to keep you healthy. Ask your health care provider for more information. WEIGHT AND DIET  Eat a healthy diet  Be sure to include plenty of vegetables, fruits, low-fat dairy products, and lean protein.  Do not eat a lot of foods high in solid fats, added sugars, or salt.  Get regular exercise. This is one of the most important things you can do for your health.  Most adults should exercise for at least 150 minutes each week. The exercise should increase your heart rate and make you sweat (moderate-intensity exercise).  Most adults should also do strengthening exercises at least twice a week. This is in addition to the moderate-intensity exercise.  Maintain a healthy weight  Body mass index (BMI) is a measurement that can be used to identify possible weight problems. It estimates body fat based on height and weight. Your health care provider can help determine your BMI and help you achieve or maintain a healthy weight.  For females 8 years of age and older:   A BMI below 18.5 is considered underweight.  A BMI of 18.5  to 24.9 is normal.  A BMI of 25 to 29.9 is considered overweight.  A BMI of 30 and above is considered obese.  Watch levels of cholesterol and blood lipids  You should start having your blood tested for lipids and cholesterol at 46 years of age, then have this test every 5 years.  You may need to have your cholesterol levels checked more often if:  Your lipid or cholesterol levels are high.  You are older than 46 years of age.  You are at high risk for heart disease.  CANCER SCREENING   Lung Cancer  Lung cancer screening is recommended for adults 62-46 years old who are at high risk for lung cancer because of a history of smoking.  A yearly low-dose CT scan of the lungs is recommended for people who:  Currently smoke.  Have quit within the past 15 years.  Have at least a 30-pack-year history of smoking. A pack year is smoking an average of one pack of cigarettes a day for 1 year.  Yearly screening should continue until it has been 15 years since you quit.  Yearly screening should stop if you develop a health problem that would prevent you from having lung cancer treatment.  Breast Cancer  Practice breast self-awareness. This means understanding how your breasts normally appear and feel.  It also means doing regular breast self-exams. Let your health care provider know about any changes, no matter how small.  If you are in  your 20s or 30s, you should have a clinical breast exam (CBE) by a health care provider every 1-3 years as part of a regular health exam.  If you are 40 or older, have a CBE every year. Also consider having a breast X-ray (mammogram) every year.  If you have a family history of breast cancer, talk to your health care provider about genetic screening.  If you are at high risk for breast cancer, talk to your health care provider about having an MRI and a mammogram every year.  Breast cancer gene (BRCA) assessment is recommended for women who have  family members with BRCA-related cancers. BRCA-related cancers include:  Breast.  Ovarian.  Tubal.  Peritoneal cancers.  Results of the assessment will determine the need for genetic counseling and BRCA1 and BRCA2 testing. Cervical Cancer Your health care provider may recommend that you be screened regularly for cancer of the pelvic organs (ovaries, uterus, and vagina). This screening involves a pelvic examination, including checking for microscopic changes to the surface of your cervix (Pap test). You may be encouraged to have this screening done every 3 years, beginning at age 21.  For women ages 30-65, health care providers may recommend pelvic exams and Pap testing every 3 years, or they may recommend the Pap and pelvic exam, combined with testing for human papilloma virus (HPV), every 5 years. Some types of HPV increase your risk of cervical cancer. Testing for HPV may also be done on women of any age with unclear Pap test results.  Other health care providers may not recommend any screening for nonpregnant women who are considered low risk for pelvic cancer and who do not have symptoms. Ask your health care provider if a screening pelvic exam is right for you.  If you have had past treatment for cervical cancer or a condition that could lead to cancer, you need Pap tests and screening for cancer for at least 20 years after your treatment. If Pap tests have been discontinued, your risk factors (such as having a new sexual partner) need to be reassessed to determine if screening should resume. Some women have medical problems that increase the chance of getting cervical cancer. In these cases, your health care provider may recommend more frequent screening and Pap tests. Colorectal Cancer  This type of cancer can be detected and often prevented.  Routine colorectal cancer screening usually begins at 46 years of age and continues through 46 years of age.  Your health care provider may  recommend screening at an earlier age if you have risk factors for colon cancer.  Your health care provider may also recommend using home test kits to check for hidden blood in the stool.  A small camera at the end of a tube can be used to examine your colon directly (sigmoidoscopy or colonoscopy). This is done to check for the earliest forms of colorectal cancer.  Routine screening usually begins at age 50.  Direct examination of the colon should be repeated every 5-10 years through 46 years of age. However, you may need to be screened more often if early forms of precancerous polyps or small growths are found. Skin Cancer  Check your skin from head to toe regularly.  Tell your health care provider about any new moles or changes in moles, especially if there is a change in a mole's shape or color.  Also tell your health care provider if you have a mole that is larger than the size of a pencil   eraser.  Always use sunscreen. Apply sunscreen liberally and repeatedly throughout the day.  Protect yourself by wearing long sleeves, pants, a wide-brimmed hat, and sunglasses whenever you are outside. HEART DISEASE, DIABETES, AND HIGH BLOOD PRESSURE   High blood pressure causes heart disease and increases the risk of stroke. High blood pressure is more likely to develop in:  People who have blood pressure in the high end of the normal range (130-139/85-89 mm Hg).  People who are overweight or obese.  People who are African American.  If you are 18-39 years of age, have your blood pressure checked every 3-5 years. If you are 40 years of age or older, have your blood pressure checked every year. You should have your blood pressure measured twice--once when you are at a hospital or clinic, and once when you are not at a hospital or clinic. Record the average of the two measurements. To check your blood pressure when you are not at a hospital or clinic, you can use:  An automated blood pressure  machine at a pharmacy.  A home blood pressure monitor.  If you are between 55 years and 79 years old, ask your health care provider if you should take aspirin to prevent strokes.  Have regular diabetes screenings. This involves taking a blood sample to check your fasting blood sugar level.  If you are at a normal weight and have a low risk for diabetes, have this test once every three years after 45 years of age.  If you are overweight and have a high risk for diabetes, consider being tested at a younger age or more often. PREVENTING INFECTION  Hepatitis B  If you have a higher risk for hepatitis B, you should be screened for this virus. You are considered at high risk for hepatitis B if:  You were born in a country where hepatitis B is common. Ask your health care provider which countries are considered high risk.  Your parents were born in a high-risk country, and you have not been immunized against hepatitis B (hepatitis B vaccine).  You have HIV or AIDS.  You use needles to inject street drugs.  You live with someone who has hepatitis B.  You have had sex with someone who has hepatitis B.  You get hemodialysis treatment.  You take certain medicines for conditions, including cancer, organ transplantation, and autoimmune conditions. Hepatitis C  Blood testing is recommended for:  Everyone born from 1945 through 1965.  Anyone with known risk factors for hepatitis C. Sexually transmitted infections (STIs)  You should be screened for sexually transmitted infections (STIs) including gonorrhea and chlamydia if:  You are sexually active and are younger than 46 years of age.  You are older than 46 years of age and your health care provider tells you that you are at risk for this type of infection.  Your sexual activity has changed since you were last screened and you are at an increased risk for chlamydia or gonorrhea. Ask your health care provider if you are at risk.  If  you do not have HIV, but are at risk, it may be recommended that you take a prescription medicine daily to prevent HIV infection. This is called pre-exposure prophylaxis (PrEP). You are considered at risk if:  You are sexually active and do not regularly use condoms or know the HIV status of your partner(s).  You take drugs by injection.  You are sexually active with a partner who has HIV. Talk with   your health care provider about whether you are at high risk of being infected with HIV. If you choose to begin PrEP, you should first be tested for HIV. You should then be tested every 3 months for as long as you are taking PrEP.  PREGNANCY   If you are premenopausal and you may become pregnant, ask your health care provider about preconception counseling.  If you may become pregnant, take 400 to 800 micrograms (mcg) of folic acid every day.  If you want to prevent pregnancy, talk to your health care provider about birth control (contraception). OSTEOPOROSIS AND MENOPAUSE   Osteoporosis is a disease in which the bones lose minerals and strength with aging. This can result in serious bone fractures. Your risk for osteoporosis can be identified using a bone density scan.  If you are 65 years of age or older, or if you are at risk for osteoporosis and fractures, ask your health care provider if you should be screened.  Ask your health care provider whether you should take a calcium or vitamin D supplement to lower your risk for osteoporosis.  Menopause may have certain physical symptoms and risks.  Hormone replacement therapy may reduce some of these symptoms and risks. Talk to your health care provider about whether hormone replacement therapy is right for you.  HOME CARE INSTRUCTIONS   Schedule regular health, dental, and eye exams.  Stay current with your immunizations.   Do not use any tobacco products including cigarettes, chewing tobacco, or electronic cigarettes.  If you are  pregnant, do not drink alcohol.  If you are breastfeeding, limit how much and how often you drink alcohol.  Limit alcohol intake to no more than 1 drink per day for nonpregnant women. One drink equals 12 ounces of beer, 5 ounces of wine, or 1 ounces of hard liquor.  Do not use street drugs.  Do not share needles.  Ask your health care provider for help if you need support or information about quitting drugs.  Tell your health care provider if you often feel depressed.  Tell your health care provider if you have ever been abused or do not feel safe at home.   This information is not intended to replace advice given to you by your health care provider. Make sure you discuss any questions you have with your health care provider.   Document Released: 02/16/2011 Document Revised: 08/24/2014 Document Reviewed: 07/05/2013 Elsevier Interactive Patient Education 2016 [employer]  

## 2015-06-14 NOTE — Progress Notes (Signed)
Pre visit review using our clinic review tool, if applicable. No additional management support is needed unless otherwise documented below in the visit note. 

## 2015-07-03 ENCOUNTER — Telehealth: Payer: Self-pay | Admitting: Internal Medicine

## 2015-07-03 NOTE — Telephone Encounter (Signed)
[facility] - Client [place] [facility] Patient Name: Rhonda [name] DOB: [birth year] Initial Comment Caller states that has a nagging dry cough and wants to know what can take for it. Nurse Assessment Nurse: Martyn Ehrich, RN, Felicia Date/Time (Eastern Time): 07/03/2015 3:26:31 PM Confirm and document reason for call. If symptomatic, describe symptoms. ---PT has a dry cough onset Monday morning - intermittent runny nose - fullness in her forehead. No fever. She had had off and on runny nose with cough a little over 2 weeks ago. Has the patient traveled out of the country within the last 30 days? ---No Does the patient have any new or worsening symptoms? ---Yes Will a triage be completed? ---Yes Related visit to physician within the last 2 weeks? ---No Does the PT have any chronic conditions? (i.e. diabetes, asthma, etc.) ---NoDid the patient indicate they were pregnant? ---No Is this a behavioral health call? ---No Guidelines Guideline Title Affirmed Question Affirmed Notes Asthma Attack [1] Wheezing or coughing AND [2] hasn't used neb or inhaler twice AND [3] it's available Asthma Attack No asthma check-up in > 6 months Final Disposition User See PCP within 2 Jacqlyn Larsen, RN, Felicia Comments no pain in forehead Referrals GO TO FACILITY UNDECIDED GO TO FACILITY REFUSED Disagree/Comply: ComplyDisagree/Comply: Disagree Disagree/Comply Reason: Wait and see  Call IdYQ:[id number]

## 2015-07-03 NOTE — Telephone Encounter (Signed)
Could you please confirm if home advice and patient's "wait and see" plan is sufficient for symptoms

## 2015-07-03 NOTE — Telephone Encounter (Signed)
Additional care advise given:  Care Advice Given Per Guideline * Call me back immediately if: you become worse before my follow-up call * I'll call you back in 30-60 minutes to see how you are doing * Give yourself a nebulizer or inhaler (4 puffs) treatment using your quick-relief medicine (e.g., albuterol) right now. * Give yourself another treatment in 20 minutes. * Then I'll call you back. * You become worse before RN follow-up call * RN hasn't called back within 60 minutes. SEE PCP WITHIN 2 WEEKS: You need an evaluation for this ongoing problem within the next 2 weeks. Call your doctor during regular office hours and make an appointment. GETTING YOUR ASTHMA UNDER CONTROL: Your asthma is not under good control. You need to meet with your PCP about ways to prevent so many flare-ups. * Start your quick-relief medicine (e.g., albuterol, salbutamol) at the first sign of any coughing or shortness of breath (don't wait for wheezing). * Use inhaler (2 puffs each time) or nebulizer every 4 hours. * The best 'cough medicine' for an adult with asthma is always the asthma medicine. NOTE: Don't use cough suppressants, but COUGH DROPS may help a tickly cough. * Drink a normal amount of liquids (e.g., water). Being adequately hydrated makes it easier to cough up the sticky lung mucus. SEE PCP WITHIN 2 WEEKS: You need an evaluation for this ongoing problem within the next 2 weeks. Call your doctor during regular office hours and make an appointment. GETTING YOUR ASTHMA UNDER CONTROL: Your asthma is not under good control. You need to meet with your PCP about ways to prevent so many flare-ups. * Start your quick-relief medicine (e.g., albuterol, salbutamol) at the first sign of any coughing or shortness of breath (don't wait for wheezing). * Use inhaler (2 puffs each time) or nebulizer every 4 hours. * Drink a normal amount of liquids (e.g., water). Being adequately hydrated makes it easier to cough up the  sticky lung mucus. DRINKING FLUIDS AND USING A HUMIDIFIER: * Wheezing is not improved after neb or inhaler * Inhaled asthma medicine (neb or MDI) is needed more often than every 4 hours * Wheezing is not completely cleared by 5 days * You become worse. HAY FEVER: If you have symptoms from hay fever, it's OK to take

## 2015-07-04 ENCOUNTER — Other Ambulatory Visit: Payer: Self-pay | Admitting: Family Medicine

## 2015-07-04 ENCOUNTER — Other Ambulatory Visit: Payer: Self-pay | Admitting: Internal Medicine

## 2015-07-04 NOTE — Telephone Encounter (Signed)
Cannot determine need based off note. Key questions I have- is she wheezing? Are there fevers? Any shortness of breath? If answer is yes to any of these would advise her to be seen this week  The "guideline title affirmed question affirmed notes" state asthma attack but its not clear what that is based off of.

## 2015-07-04 NOTE — Telephone Encounter (Signed)
Left message on voicemail to call office.  

## 2015-07-04 NOTE — Telephone Encounter (Signed)
Pt called back, c/o dry cough is worse especially at night. Chest congestion and nasal congestion.Using inhaler but does not feel like it is helping. Denies fever or SOB. Told pt need to be seen before weekend. Asked pt if she can come in tomorrow times available 2:00 to 3:30. Pt said she will take 3:30 pm appt. Told her okay appt scheduled with Dr. Elease Hashimoto for 3:30 tomorrow. Pt verbalized understanding.

## 2015-07-05 ENCOUNTER — Ambulatory Visit (INDEPENDENT_AMBULATORY_CARE_PROVIDER_SITE_OTHER): Payer: BLUE CROSS/BLUE SHIELD | Admitting: Family Medicine

## 2015-07-05 DIAGNOSIS — J069 Acute upper respiratory infection, unspecified: Secondary | ICD-10-CM | POA: Diagnosis not present

## 2015-07-05 DIAGNOSIS — B9789 Other viral agents as the cause of diseases classified elsewhere: Principal | ICD-10-CM

## 2015-07-05 MED ORDER — HYDROCODONE-HOMATROPINE 5-1.5 MG/5ML PO SYRP: 5.0000 mL | ORAL_SOLUTION | Freq: Four times a day (QID) | ORAL | Status: AC | PRN

## 2015-07-05 NOTE — Patient Instructions (Signed)

## 2015-07-05 NOTE — Progress Notes (Signed)
   Subjective:    Patient ID: Rhonda [name], female    DOB: 1970/10/08, 44 y.o.   MRN: [id number]  HPI  Acute visit. Patient seen with onset about 4 days ago of cough. Symptoms are worse at night. She's had some nasal congestion with postnasal drip. No body aches. No fever but occasional chills. Nonsmoker. Reported allergy to Dilaudid but she has taken Hycodan cough syrup in the past without difficulty. She has not gotten relief with over-the-counter cough medications. Denies any pleuritic pain or dyspnea  Past Medical History  Diagnosis Date  . Allergy   . Hypertension   . IBS (irritable bowel syndrome)   . Hyperlipemia   . Anxiety   . GERD (gastroesophageal reflux disease)   . Chronic constipation   . Thyroid disease   . Nephropathy   . Kidney disease    Past Surgical History  Procedure Laterality Date  . Abdominal hysterectomy  1998  . Cholecystectomy  1997  . Esophagogastroduodenoscopy    . Tubal ligation    . Thyroid surgery      reports that she has never smoked. She has never used smokeless tobacco. She reports that she does not drink alcohol or use illicit drugs. family history includes Coronary artery disease in her father; Diabetes in her father and mother; Hypertension in her father. There is no history of Colon cancer, Colon polyps, Kidney disease, Esophageal cancer, or Gallbladder disease. Allergies  Allergen Reactions  . Aspirin Hives  . Dilaudid [Hydromorphone] Itching  . Flagyl [Metronidazole] Nausea And Vomiting    With oral flagyl.   Has tolerated vaginal flagyl.    . Food Other (See Comments)    Lima beans  . Tramadol Other (See Comments)    Headache      Review of Systems  Constitutional: Negative for fever and chills.  HENT: Positive for congestion and rhinorrhea.   Respiratory: Positive for cough. Negative for shortness of breath and wheezing.   Cardiovascular: Negative for chest pain.       Objective:   Physical Exam  Constitutional: She  appears well-developed and well-nourished.  HENT:  Right Ear: External ear normal.  Left Ear: External ear normal.  Mouth/Throat: Oropharynx is clear and moist.  Neck: Neck supple.  Cardiovascular: Normal rate and regular rhythm.   Pulmonary/Chest: Effort normal and breath sounds normal. No respiratory distress. She has no wheezes. She has no rales.  Lymphadenopathy:    She has no cervical adenopathy.          Assessment & Plan:  Viral URI with cough. Hycodan cough syrup 1 teaspoon every 6 hours when necessary for severe cough. She has taken this in the past without difficulty. Follow-up promptly for any fever or other concerns

## 2015-07-05 NOTE — Progress Notes (Signed)
Pre visit review using our clinic review tool, if applicable. No additional management support is needed unless otherwise documented below in the visit note. 

## 2015-07-15 ENCOUNTER — Telehealth: Payer: Self-pay | Admitting: Internal Medicine

## 2015-07-15 ENCOUNTER — Other Ambulatory Visit: Payer: Self-pay | Admitting: *Deleted

## 2015-07-15 MED ORDER — ACYCLOVIR 5 % EX CREA: 1.0000 "application " | TOPICAL_CREAM | Freq: Three times a day (TID) | CUTANEOUS | Status: DC

## 2015-07-15 MED ORDER — ACYCLOVIR 400 MG PO TABS: ORAL_TABLET | ORAL | Status: DC

## 2015-07-15 NOTE — Telephone Encounter (Signed)
Pt call to ask for the the following rx ZOVIRAX . [facility] [place]

## 2015-07-15 NOTE — Telephone Encounter (Signed)
Pt called back and stated she wants zovirax cream not pills. Discussed with Dr. Raliegh Ip okay to send cream. Pt aware Rx sent to pharmacy.

## 2015-07-15 NOTE — Telephone Encounter (Signed)
Please see message and advise 

## 2015-07-15 NOTE — Telephone Encounter (Signed)
Left message on personal voicemail Rx sent to pharmacy as requested. 

## 2015-07-15 NOTE — Telephone Encounter (Signed)
Generic Zovirax 400 mg, #15 1 3  times a day for 5 days

## 2015-07-21 ENCOUNTER — Other Ambulatory Visit: Payer: Self-pay | Admitting: Family Medicine

## 2015-08-02 ENCOUNTER — Ambulatory Visit: Payer: BLUE CROSS/BLUE SHIELD | Admitting: Family Medicine

## 2015-08-02 ENCOUNTER — Ambulatory Visit (INDEPENDENT_AMBULATORY_CARE_PROVIDER_SITE_OTHER): Payer: BLUE CROSS/BLUE SHIELD | Admitting: Internal Medicine

## 2015-08-02 ENCOUNTER — Encounter: Payer: Self-pay | Admitting: Internal Medicine

## 2015-08-02 VITALS — BP 118/84 | HR 80 | Temp 98.7°F | Ht 65.0 in | Wt 217.0 lb

## 2015-08-02 DIAGNOSIS — N390 Urinary tract infection, site not specified: Secondary | ICD-10-CM | POA: Diagnosis not present

## 2015-08-02 DIAGNOSIS — I10 Essential (primary) hypertension: Secondary | ICD-10-CM | POA: Diagnosis not present

## 2015-08-02 DIAGNOSIS — N183 Chronic kidney disease, stage 3 unspecified: Secondary | ICD-10-CM

## 2015-08-02 LAB — POCT URINALYSIS DIPSTICK
BILIRUBIN UA: NEGATIVE
Blood, UA: NEGATIVE
GLUCOSE UA: NEGATIVE
Ketones, UA: NEGATIVE
Nitrite, UA: NEGATIVE
Spec Grav, UA: >=1.03
Urobilinogen, UA: 0.2
pH, UA: 6

## 2015-08-02 MED ORDER — AMOXICILLIN 500 MG PO CAPS: 500.0000 mg | ORAL_CAPSULE | Freq: Three times a day (TID) | ORAL | Status: DC

## 2015-08-02 NOTE — Patient Instructions (Signed)
Drink as much fluid as you  can tolerate over the next few days  Take your antibiotic as prescribed until ALL of it is gone, but stop if you develop a rash, swelling, or any side effects of the medication.  Contact our office as soon as possible if  there are side effects of the medication.   

## 2015-08-02 NOTE — Progress Notes (Signed)
Subjective:    Patient ID: Rhonda [name], female    DOB: [birth year], 44 y.o.   MRN: [id number]  HPI  44 year old patient who has a history of recurrent UTIs.  She is also followed by renal medicine due to known nephropathy.  She states recently she had significant proteinuria in the range of 1500 mg per 24 hours. Complaints include urinary frequency, dysuria and mild low back discomfort She has treated hypertension and ARB dosing has been recently increased.  She otherwise has done well.  Past Medical History  Diagnosis Date  . Allergy   . Hypertension   . IBS (irritable bowel syndrome)   . Hyperlipemia   . Anxiety   . GERD (gastroesophageal reflux disease)   . Chronic constipation   . Thyroid disease   . Nephropathy   . Kidney disease     Social History   Social History  . Marital Status: Divorced    Spouse Name: N/A  . Number of Children: 2  . Years of Education: N/A   Occupational History  . NURSING ASSISTANT    Social History Main Topics  . Smoking status: Never Smoker   . Smokeless tobacco: Never Used  . Alcohol Use: No  . Drug Use: No  . Sexual Activity: Yes    Birth Control/ Protection: Surgical   Other Topics Concern  . Not on file   Social History Narrative    Past Surgical History  Procedure Laterality Date  . Abdominal hysterectomy  1998  . Cholecystectomy  1997  . Esophagogastroduodenoscopy    . Tubal ligation    . Thyroid surgery      Family History  Problem Relation Age of Onset  . Hypertension Father   . Coronary artery disease Father   . Colon cancer Neg Hx   . Colon polyps Neg Hx   . Kidney disease Neg Hx   . Esophageal cancer Neg Hx   . Gallbladder disease Neg Hx   . Diabetes Father   . Diabetes Mother     Pre-diabetic    Allergies  Allergen Reactions  . Aspirin Hives  . Dilaudid [Hydromorphone] Itching  . Flagyl [Metronidazole] Nausea And Vomiting    With oral flagyl.   Has tolerated vaginal flagyl.    . Food Other  (See Comments)    Lima beans  . Tramadol Other (See Comments)    Headache     Current Outpatient Prescriptions on File Prior to Visit  Medication Sig Dispense Refill  . acyclovir cream (ZOVIRAX) 5 % Apply 1 application topically 3 (three) times daily. 15 g 1  . albuterol (PROVENTIL HFA;VENTOLIN HFA) 108 (90 BASE) MCG/ACT inhaler Inhale 2 puffs into the lungs every 6 (six) hours as needed for wheezing or shortness of breath. 1 Inhaler 5  . ALPRAZolam (XANAX) 0.25 MG tablet Take 1 tablet (0.25 mg total) by mouth 2 (two) times daily as needed for anxiety. 60 tablet 2  . alum & mag hydroxide-simeth (MAALOX/MYLANTA) 200-200-20 MG/5ML suspension Take 30 mLs by mouth every 6 (six) hours as needed for indigestion or heartburn.     Marland Kitchen amLODipine (NORVASC) 5 MG tablet TAKE 1 TABLET BY MOUTH EVERY DAY 30 tablet 5  . cyclobenzaprine (FLEXERIL) 10 MG tablet Take 1 tablet (10 mg total) by mouth every 8 (eight) hours as needed for muscle spasms. 10 tablet 0  . docusate sodium (COLACE) 100 MG capsule Take 100 mg by mouth daily.     Marland Kitchen levothyroxine (SYNTHROID, LEVOTHROID) 150 MCG  tablet TAKE 1 TABLET BY MOUTH EVERY DAY BEFORE BREAKFAST 30 tablet 5  . loratadine (CLARITIN) 10 MG tablet Take 10 mg by mouth daily.    . metoprolol succinate (TOPROL-XL) 50 MG 24 hr tablet TAKE 1 TABLET BY MOUTH EVERY DAY IMMEDIATELY FOLLOWING A MEAL 30 tablet 5  . Multiple Vitamin (MULTIVITAMIN) tablet Take 1 tablet by mouth daily.      Marland Kitchen omeprazole (PRILOSEC) 40 MG capsule Take 1 capsule (40 mg total) by mouth daily. 30 capsule 11  . omeprazole (PRILOSEC) 40 MG capsule TAKE 1 CAPSULE BY MOUTH EVERY DAY 30 capsule 5  . promethazine (PHENERGAN) 25 MG tablet Take 1 tablet (25 mg total) by mouth every 6 (six) hours as needed for nausea or vomiting. 30 tablet 0  . valsartan (DIOVAN) 160 MG tablet TAKE 1 TABLET BY MOUTH EVERY DAY 30 tablet 5   No current facility-administered medications on file prior to visit.    BP 118/84 mmHg   Pulse 80  Temp(Src) 98.7 F (37.1 C) (Oral)  Ht 5\' 5"  (1.651 m)  Wt 217 lb (98.431 kg)  BMI 36.11 kg/m2  SpO2 99%     Review of Systems  Constitutional: Negative.   HENT: Negative for congestion, dental problem, hearing loss, rhinorrhea, sinus pressure, sore throat and tinnitus.   Eyes: Negative for pain, discharge and visual disturbance.  Respiratory: Negative for cough and shortness of breath.   Cardiovascular: Negative for chest pain, palpitations and leg swelling.  Gastrointestinal: Negative for nausea, vomiting, abdominal pain, diarrhea, constipation, blood in stool and abdominal distention.  Genitourinary: Positive for dysuria, urgency, frequency and difficulty urinating. Negative for hematuria, flank pain, vaginal bleeding, vaginal discharge, vaginal pain and pelvic pain.  Musculoskeletal: Negative for joint swelling, arthralgias and gait problem.  Skin: Negative for rash.  Neurological: Negative for dizziness, syncope, speech difficulty, weakness, numbness and headaches.  Hematological: Negative for adenopathy.  Psychiatric/Behavioral: Negative for behavioral problems, dysphoric mood and agitation. The patient is not nervous/anxious.        Objective:   Physical Exam  Constitutional:  Blood pressure 124/84          Assessment & Plan:   Early UTI.  Urinalysis revealed significant proteinuria but only lost 1 pyuria.  Symptoms are highly suggestive and typical for the patient.  Will treat with amoxicillin 500 mg 3 times a day Hypertension, stable

## 2015-08-02 NOTE — Progress Notes (Signed)
Pre visit review using our clinic review tool, if applicable. No additional management support is needed unless otherwise documented below in the visit note. 

## 2015-08-20 ENCOUNTER — Telehealth: Payer: Self-pay | Admitting: Internal Medicine

## 2015-08-20 MED ORDER — PROMETHAZINE HCL 25 MG PO TABS: 25.0000 mg | ORAL_TABLET | Freq: Four times a day (QID) | ORAL | Status: DC | PRN

## 2015-08-20 NOTE — Telephone Encounter (Signed)
Left message on voicemail Rx sent to pharmacy as requested. 

## 2015-08-20 NOTE — Telephone Encounter (Signed)
Pt would like refill on phenergan [facility] [address]

## 2015-08-23 ENCOUNTER — Telehealth: Payer: Self-pay | Admitting: Internal Medicine

## 2015-08-23 MED ORDER — CIPROFLOXACIN HCL 500 MG PO TABS: 500.0000 mg | ORAL_TABLET | Freq: Two times a day (BID) | ORAL | Status: DC

## 2015-08-23 NOTE — Telephone Encounter (Signed)
Please see message and advise 

## 2015-08-23 NOTE — Telephone Encounter (Signed)
Pt called because she is still having UTI sxs. She was wondering if you could call her in the Cipro like she normally takes, since that's what usually works. Pt uses the [facility] [address]. Please advise

## 2015-08-23 NOTE — Telephone Encounter (Signed)
Ciprofloxacin 500, #6 one twice a day

## 2015-08-23 NOTE — Telephone Encounter (Signed)
Spoke to pt told her Rx for Cipro sent to pharmacy. Take one table twice a day for 3 days. Pt verbalized understanding.

## 2015-10-22 ENCOUNTER — Telehealth: Payer: Self-pay | Admitting: Internal Medicine

## 2015-10-22 NOTE — Telephone Encounter (Signed)
Patient came in to drop off paperwork. Patient states that she needs a TB skin and wanted to know if she could come in after 2 to this done.

## 2015-10-22 NOTE — Telephone Encounter (Signed)
Rhonda [name], I will not be here tomorrow afternoon. Pt can come in if someone else will give it but she has to come back Friday to have read. Otherwise she will have to wait till Monday because needs to be read in 48 to 72 hours.

## 2015-10-24 NOTE — Telephone Encounter (Signed)
Called patient to schedule TB skin test with Rhonda [name]

## 2015-10-28 ENCOUNTER — Ambulatory Visit (INDEPENDENT_AMBULATORY_CARE_PROVIDER_SITE_OTHER): Payer: BLUE CROSS/BLUE SHIELD | Admitting: *Deleted

## 2015-10-28 DIAGNOSIS — Z111 Encounter for screening for respiratory tuberculosis: Secondary | ICD-10-CM

## 2015-10-30 LAB — TB SKIN TEST
Induration: 0 mm
TB SKIN TEST: NEGATIVE

## 2015-11-01 ENCOUNTER — Other Ambulatory Visit: Payer: Self-pay

## 2015-11-01 DIAGNOSIS — Z1231 Encounter for screening mammogram for malignant neoplasm of breast: Secondary | ICD-10-CM

## 2015-11-16 ENCOUNTER — Encounter (HOSPITAL_COMMUNITY): Payer: Self-pay | Admitting: Emergency Medicine

## 2015-11-16 ENCOUNTER — Emergency Department (HOSPITAL_COMMUNITY)
Admission: EM | Admit: 2015-11-16 | Discharge: 2015-11-17 | Disposition: A | Discharge status: Home / Self Care | Payer: BLUE CROSS/BLUE SHIELD | Attending: Emergency Medicine | Admitting: Emergency Medicine

## 2015-11-16 ENCOUNTER — Emergency Department (HOSPITAL_COMMUNITY): Payer: BLUE CROSS/BLUE SHIELD

## 2015-11-16 DIAGNOSIS — Z792 Long term (current) use of antibiotics: Secondary | ICD-10-CM | POA: Insufficient documentation

## 2015-11-16 DIAGNOSIS — R0789 Other chest pain: Secondary | ICD-10-CM

## 2015-11-16 DIAGNOSIS — E079 Disorder of thyroid, unspecified: Secondary | ICD-10-CM | POA: Diagnosis not present

## 2015-11-16 DIAGNOSIS — Z79899 Other long term (current) drug therapy: Secondary | ICD-10-CM | POA: Insufficient documentation

## 2015-11-16 DIAGNOSIS — K219 Gastro-esophageal reflux disease without esophagitis: Secondary | ICD-10-CM | POA: Insufficient documentation

## 2015-11-16 DIAGNOSIS — I10 Essential (primary) hypertension: Secondary | ICD-10-CM | POA: Insufficient documentation

## 2015-11-16 DIAGNOSIS — R05 Cough: Secondary | ICD-10-CM | POA: Insufficient documentation

## 2015-11-16 DIAGNOSIS — R079 Chest pain, unspecified: Secondary | ICD-10-CM | POA: Diagnosis present

## 2015-11-16 DIAGNOSIS — Z87448 Personal history of other diseases of urinary system: Secondary | ICD-10-CM | POA: Insufficient documentation

## 2015-11-16 DIAGNOSIS — F419 Anxiety disorder, unspecified: Secondary | ICD-10-CM | POA: Insufficient documentation

## 2015-11-16 LAB — BASIC METABOLIC PANEL
Anion gap: 8 (ref 5–15)
BUN: 19 mg/dL (ref 6–20)
CALCIUM: 9.6 mg/dL (ref 8.9–10.3)
CHLORIDE: 108 mmol/L (ref 101–111)
CO2: 24 mmol/L (ref 22–32)
CREATININE: 1.15 mg/dL — AB (ref 0.44–1.00)
GFR calc Af Amer: >60 mL/min (ref >60)
GFR calc non Af Amer: 56 mL/min — ABNORMAL LOW (ref >60)
GLUCOSE: 100 mg/dL — AB (ref 65–99)
Potassium: 4.1 mmol/L (ref 3.5–5.1)
Sodium: 140 mmol/L (ref 135–145)

## 2015-11-16 LAB — CBC
HCT: 38.6 % (ref 36.0–46.0)
Hemoglobin: 12.6 g/dL (ref 12.0–15.0)
MCH: 27.5 pg (ref 26.0–34.0)
MCHC: 32.6 g/dL (ref 30.0–36.0)
MCV: 84.3 fL (ref 78.0–100.0)
PLATELETS: 274 10*3/uL (ref 150–400)
RBC: 4.58 MIL/uL (ref 3.87–5.11)
RDW: 13 % (ref 11.5–15.5)
WBC: 8.4 10*3/uL (ref 4.0–10.5)

## 2015-11-16 LAB — I-STAT TROPONIN, ED
TROPONIN I, POC: 0 ng/mL (ref 0.00–0.08)
Troponin i, poc: 0 ng/mL (ref 0.00–0.08)

## 2015-11-16 MED ORDER — MORPHINE SULFATE (PF) 4 MG/ML IV SOLN
4.0000 mg | Freq: Once | INTRAVENOUS | Status: AC
  Administered 2015-11-16: 4 mg via INTRAVENOUS
  Filled 2015-11-16: qty 1

## 2015-11-16 MED ORDER — IOPAMIDOL (ISOVUE-370) INJECTION 76%
100.0000 mL | Freq: Once | INTRAVENOUS | Status: AC | PRN
  Administered 2015-11-16: 100 mL via INTRAVENOUS

## 2015-11-16 NOTE — ED Notes (Signed)
Bed: WTR7 Expected date:  Expected time:  Means of arrival:  Comments: LAB

## 2015-11-16 NOTE — ED Notes (Signed)
Pt hesitant to take 4 mg of morphine at one time.  2 mg given initially, will hold the additional 2 mg until pt requests it.  Dr. Lenna Sciara is aware and in agreement with this.

## 2015-11-16 NOTE — Discharge Instructions (Signed)
Nonspecific Chest Pain  Call Dr.Kwiatowski in 2 days to schedule the next available outpatient visit. We feel that you may need further testing to check her heart out such as an exercise stress test for other testing. Return if concerned for any reason Chest pain can be caused by many different conditions. There is always a chance that your pain could be related to something serious, such as a heart attack or a blood clot in your lungs. Chest pain can also be caused by conditions that are not life-threatening. If you have chest pain, it is very important to follow up with your health care provider. CAUSES  Chest pain can be caused by:  Heartburn.  Pneumonia or bronchitis.  Anxiety or stress.  Inflammation around your heart (pericarditis) or lung (pleuritis or pleurisy).  A blood clot in your lung.  A collapsed lung (pneumothorax). It can develop suddenly on its own (spontaneous pneumothorax) or from trauma to the chest.  Shingles infection (varicella-zoster virus).  Heart attack.  Damage to the bones, muscles, and cartilage that make up your chest wall. This can include:  Bruised bones due to injury.  Strained muscles or cartilage due to frequent or repeated coughing or overwork.  Fracture to one or more ribs.  Sore cartilage due to inflammation (costochondritis). RISK FACTORS  Risk factors for chest pain may include:  Activities that increase your risk for trauma or injury to your chest.  Respiratory infections or conditions that cause frequent coughing.  Medical conditions or overeating that can cause heartburn.  Heart disease or family history of heart disease.  Conditions or health behaviors that increase your risk of developing a blood clot.  Having had chicken pox (varicella zoster). SIGNS AND SYMPTOMS Chest pain can feel like:  Burning or tingling on the surface of your chest or deep in your chest.  Crushing, pressure, aching, or squeezing pain.  Dull or  sharp pain that is worse when you move, cough, or take a deep breath.  Pain that is also felt in your back, neck, shoulder, or arm, or pain that spreads to any of these areas. Your chest pain may come and go, or it may stay constant. DIAGNOSIS Lab tests or other studies may be needed to find the cause of your pain. Your health care provider may have you take a test called an ambulatory ECG (electrocardiogram). An ECG records your heartbeat patterns at the time the test is performed. You may also have other tests, such as:  Transthoracic echocardiogram (TTE). During echocardiography, sound waves are used to create a picture of all of the heart structures and to look at how blood flows through your heart.  Transesophageal echocardiogram (TEE).This is a more advanced imaging test that obtains images from inside your body. It allows your health care provider to see your heart in finer detail.  Cardiac monitoring. This allows your health care provider to monitor your heart rate and rhythm in real time.  Holter monitor. This is a portable device that records your heartbeat and can help to diagnose abnormal heartbeats. It allows your health care provider to track your heart activity for several days, if needed.  Stress tests. These can be done through exercise or by taking medicine that makes your heart beat more quickly.  Blood tests.  Imaging tests. TREATMENT  Your treatment depends on what is causing your chest pain. Treatment may include:  Medicines. These may include:  Acid blockers for heartburn.  Anti-inflammatory medicine.  Pain medicine for inflammatory  conditions.  Antibiotic medicine, if an infection is present.  Medicines to dissolve blood clots.  Medicines to treat coronary artery disease.  Supportive care for conditions that do not require medicines. This may include:  Resting.  Applying heat or cold packs to injured areas.  Limiting activities until pain  decreases. HOME CARE INSTRUCTIONS  If you were prescribed an antibiotic medicine, finish it all even if you start to feel better.  Avoid any activities that bring on chest pain.  Do not use any tobacco products, including cigarettes, chewing tobacco, or electronic cigarettes. If you need help quitting, ask your health care provider.  Do not drink alcohol.  Take medicines only as directed by your health care provider.  Keep all follow-up visits as directed by your health care provider. This is important. This includes any further testing if your chest pain does not go away.  If heartburn is the cause for your chest pain, you may be told to keep your head raised (elevated) while sleeping. This reduces the chance that acid will go from your stomach into your esophagus.  Make lifestyle changes as directed by your health care provider. These may include:  Getting regular exercise. Ask your health care provider to suggest some activities that are safe for you.  Eating a heart-healthy diet. A registered dietitian can help you to learn healthy eating options.  Maintaining a healthy weight.  Managing diabetes, if necessary.  Reducing stress. SEEK MEDICAL CARE IF:  Your chest pain does not go away after treatment.  You have a rash with blisters on your chest.  You have a fever. SEEK IMMEDIATE MEDICAL CARE IF:   Your chest pain is worse.  You have an increasing cough, or you cough up blood.  You have severe abdominal pain.  You have severe weakness.  You faint.  You have chills.  You have sudden, unexplained chest discomfort.  You have sudden, unexplained discomfort in your arms, back, neck, or jaw.  You have shortness of breath at any time.  You suddenly start to sweat, or your skin gets clammy.  You feel nauseous or you vomit.  You suddenly feel light-headed or dizzy.  Your heart begins to beat quickly, or it feels like it is skipping beats. These symptoms may  represent a serious problem that is an emergency. Do not wait to see if the symptoms will go away. Get medical help right away. Call your local emergency services (911 in the [place] Do not drive yourself to the hospital.   This information is not intended to replace advice given to you by your health care provider. Make sure you discuss any questions you have with your health care provider.   Document Released: 05/13/2005 Document Revised: 08/24/2014 Document Reviewed: 03/09/2014 Elsevier Interactive Patient Education [employer].

## 2015-11-16 NOTE — ED Notes (Signed)
Pt presents with c/o central chest "fullness" with radiation to L arm with numbness and tingling onset last night @ 1800. Pt states today she began having stabbing pain radiating through to her back. +shob with cough, neg nausea, neg vomiting

## 2015-11-16 NOTE — ED Notes (Signed)
Patient transported to CT 

## 2015-11-16 NOTE — ED Provider Notes (Signed)
CSN: [id number]     Arrival date & time 11/16/15  1912 History   First MD Initiated Contact with Patient 11/16/15 2006     Chief Complaint  Patient presents with  . Chest Pain     (Consider location/radiation/quality/duration/timing/severity/associated sxs/prior Treatment) HPI Complains of anterior chest pain radiating to back pain is right-sided parasternal radiating to right back onset 6 PM yesterday. Pain was initially intermittent lasting 1 minute at a time, pleuritic in nature and accompanied by left arm pain. Pain has been constant since 5 PM today. She developed a nonproductive cough possibly 3 hours ago. No other associated symptoms. No treatment prior to coming here. Pain is nonexertional. Past Medical History  Diagnosis Date  . Allergy   . Hypertension   . IBS (irritable bowel syndrome)   . Hyperlipemia   . Anxiety   . GERD (gastroesophageal reflux disease)   . Chronic constipation   . Thyroid disease   . Nephropathy   . Kidney disease    Past Surgical History  Procedure Laterality Date  . Abdominal hysterectomy  1998  . Cholecystectomy  1997  . Esophagogastroduodenoscopy    . Tubal ligation    . Thyroid surgery     Family History  Problem Relation Age of Onset  . Hypertension Father   . Coronary artery disease Father   . Colon cancer Neg Hx   . Colon polyps Neg Hx   . Kidney disease Neg Hx   . Esophageal cancer Neg Hx   . Gallbladder disease Neg Hx   . Diabetes Father   . Diabetes Mother     Pre-diabetic   Social History  Substance Use Topics  . Smoking status: Never Smoker   . Smokeless tobacco: Never Used  . Alcohol Use: No   OB History    No data available     Review of Systems  Constitutional: Negative.   HENT: Negative.   Respiratory: Positive for cough.   Cardiovascular: Positive for chest pain.  Gastrointestinal: Negative.   Musculoskeletal: Negative.   Skin: Negative.   Neurological: Negative.   Psychiatric/Behavioral: Negative.    All other systems reviewed and are negative.     Allergies  Aspirin; Dilaudid; Flagyl; Food; and Tramadol  Home Medications   Prior to Admission medications   Medication Sig Start Date End Date Taking? Authorizing Provider  acyclovir cream (ZOVIRAX) 5 % Apply 1 application topically 3 (three) times daily. 07/15/15   Marletta Lor, MD  albuterol (PROVENTIL HFA;VENTOLIN HFA) 108 (90 BASE) MCG/ACT inhaler Inhale 2 puffs into the lungs every 6 (six) hours as needed for wheezing or shortness of breath. 06/14/15   Marletta Lor, MD  ALPRAZolam Duanne Moron) 0.25 MG tablet Take 1 tablet (0.25 mg total) by mouth 2 (two) times daily as needed for anxiety. 06/14/15   Marletta Lor, MD  alum & mag hydroxide-simeth (MAALOX/MYLANTA) 200-200-20 MG/5ML suspension Take 30 mLs by mouth every 6 (six) hours as needed for indigestion or heartburn.     Historical Provider, MD  amLODipine (NORVASC) 5 MG tablet TAKE 1 TABLET BY MOUTH EVERY DAY 07/04/15   Marletta Lor, MD  amoxicillin (AMOXIL) 500 MG capsule Take 1 capsule (500 mg total) by mouth 3 (three) times daily. 08/02/15   Marletta Lor, MD  ciprofloxacin (CIPRO) 500 MG tablet Take 1 tablet (500 mg total) by mouth 2 (two) times daily. 08/23/15   Marletta Lor, MD  cyclobenzaprine (FLEXERIL) 10 MG tablet Take 1 tablet (10 mg  total) by mouth every 8 (eight) hours as needed for muscle spasms. 03/28/15   Robyn M Hess, PA-C  docusate sodium (COLACE) 100 MG capsule Take 100 mg by mouth daily.     Historical Provider, MD  levothyroxine (SYNTHROID, LEVOTHROID) 150 MCG tablet TAKE 1 TABLET BY MOUTH EVERY DAY BEFORE BREAKFAST 07/04/15   Marletta Lor, MD  loratadine (CLARITIN) 10 MG tablet Take 10 mg by mouth daily.    Historical Provider, MD  metoprolol succinate (TOPROL-XL) 50 MG 24 hr tablet TAKE 1 TABLET BY MOUTH EVERY DAY IMMEDIATELY FOLLOWING A MEAL 07/04/15   Marletta Lor, MD  Multiple Vitamin (MULTIVITAMIN) tablet Take  1 tablet by mouth daily.      Historical Provider, MD  omeprazole (PRILOSEC) 40 MG capsule Take 1 capsule (40 mg total) by mouth daily. 06/14/15   Marletta Lor, MD  omeprazole (PRILOSEC) 40 MG capsule TAKE 1 CAPSULE BY MOUTH EVERY DAY 07/22/15   Marletta Lor, MD  promethazine (PHENERGAN) 25 MG tablet Take 1 tablet (25 mg total) by mouth every 6 (six) hours as needed for nausea or vomiting. 08/20/15   Marletta Lor, MD  valsartan (DIOVAN) 160 MG tablet TAKE 1 TABLET BY MOUTH EVERY DAY 07/04/15   Marletta Lor, MD   BP 148/101 mmHg  Pulse 82  Temp(Src) 99.2 F (37.3 C) (Oral)  Resp 20  Ht 5\' 6"  (1.676 m)  Wt 214 lb (97.07 kg)  BMI 34.56 kg/m2  SpO2 99% Physical Exam  Constitutional: She appears well-developed and well-nourished.  HENT:  Head: Normocephalic and atraumatic.  Eyes: Conjunctivae are normal. Pupils are equal, round, and reactive to light.  Neck: Neck supple. No tracheal deviation present. No thyromegaly present.  Cardiovascular: Normal rate and regular rhythm.   No murmur heard. Pulmonary/Chest: Effort normal and breath sounds normal.  Coughing occasionally  Abdominal: Soft. Bowel sounds are normal. She exhibits no distension. There is no tenderness.  Musculoskeletal: Normal range of motion. She exhibits no edema or tenderness.  Neurological: She is alert. Coordination normal.  Skin: Skin is warm and dry. No rash noted.  Psychiatric: She has a normal mood and affect.  Nursing note and vitals reviewed.   ED Course  Procedures (including critical care time) Labs Review Labs Reviewed  CBC  BASIC METABOLIC PANEL  Randolm Idol, ED    Imaging Review Dg Chest 2 View  11/16/2015  CLINICAL DATA:  Chest pain.  Left arm pain for 1 day.  Hypertension. EXAM: CHEST  2 VIEW COMPARISON:  03/28/2015 FINDINGS: Cholecystectomy. Surgical changes in left paratracheal space. Left-sided cervical rib. Midline trachea. Normal heart size and mediastinal contours.  No pleural effusion or pneumothorax. Bilateral upper lobe scarring is mild. IMPRESSION: No acute cardiopulmonary disease. Electronically Signed   By: Abigail Miyamoto M.D.   On: 11/16/2015 20:07   I have personally reviewed and evaluated these images and lab results as part of my medical decision-making.   EKG Interpretation   Date/Time:  Saturday November 16 2015 19:24:59 EDT Ventricular Rate:  80 PR Interval:  155 QRS Duration: 96 QT Interval:  352 QTC Calculation: 406 R Axis:   81 Text Interpretation:  Sinus rhythm Nonspecific T abnrm, anterolateral  leads Confirmed by Winfred Leeds  MD, Ceili Boshers ([id number]) on 11/16/2015 7:29:33 PM     11:40 PM pain improved after treatment with intravenous morphine. Discomfort is minimal at present Chest x-ray viewed by me. Results for orders placed or performed during the hospital encounter of 11/16/15  Basic metabolic panel  Result Value Ref Range   Sodium 140 135 - 145 mmol/L   Potassium 4.1 3.5 - 5.1 mmol/L   Chloride 108 101 - 111 mmol/L   CO2 24 22 - 32 mmol/L   Glucose, Bld 100 (H) 65 - 99 mg/dL   BUN 19 6 - 20 mg/dL   Creatinine, Ser 1.15 (H) 0.44 - 1.00 mg/dL   Calcium 9.6 8.9 - 10.3 mg/dL   GFR calc non Af Amer 56 (L) >60 mL/min   GFR calc Af Amer >60 >60 mL/min   Anion gap 8 5 - 15  CBC  Result Value Ref Range   WBC 8.4 4.0 - 10.5 K/uL   RBC 4.58 3.87 - 5.11 MIL/uL   Hemoglobin 12.6 12.0 - 15.0 g/dL   HCT 38.6 36.0 - 46.0 %   MCV 84.3 78.0 - 100.0 fL   MCH 27.5 26.0 - 34.0 pg   MCHC 32.6 30.0 - 36.0 g/dL   RDW 13.0 11.5 - 15.5 %   Platelets 274 150 - 400 K/uL  I-stat troponin, ED (not at [facility], [facility])  Result Value Ref Range   Troponin i, poc 0.00 0.00 - 0.08 ng/mL   Comment 3          I-stat troponin, ED  Result Value Ref Range   Troponin i, poc 0.00 0.00 - 0.08 ng/mL   Comment 3           Dg Chest 2 View  11/16/2015  CLINICAL DATA:  Chest pain.  Left arm pain for 1 day.  Hypertension. EXAM: CHEST  2 VIEW COMPARISON:  03/28/2015  FINDINGS: Cholecystectomy. Surgical changes in left paratracheal space. Left-sided cervical rib. Midline trachea. Normal heart size and mediastinal contours. No pleural effusion or pneumothorax. Bilateral upper lobe scarring is mild. IMPRESSION: No acute cardiopulmonary disease. Electronically Signed   By: Abigail Miyamoto M.D.   On: 11/16/2015 20:07   Ct Angio Chest Pe W/cm &/or Wo Cm  11/16/2015  CLINICAL DATA:  Chest patent with radiation to left shoulder. Shortness of breath. History of pulmonary embolism. EXAM: CT ANGIOGRAPHY CHEST WITH CONTRAST TECHNIQUE: Multidetector CT imaging of the chest was performed using the standard protocol during bolus administration of intravenous contrast. Multiplanar CT image reconstructions and MIPs were obtained to evaluate the vascular anatomy. CONTRAST:  100 cc of Isovue 370 COMPARISON:  Plain films of earlier today.  CT 03/28/2015. FINDINGS: Mediastinum/Nodes: The quality of this exam for evaluation of pulmonary embolism is good. Mild limitation of suboptimal bolus timing, with contrast centered in the SVC. No evidence of pulmonary embolism. Surgical changes of presumed thyroidectomy. Normal aortic caliber without dissection. Normal heart size, without pericardial effusion. No mediastinal or hilar adenopathy. Lungs/Pleura: No pleural fluid.  Clear lungs. Upper abdomen: Normal imaged portions of the liver, spleen, stomach, right adrenal gland. Intrahepatic ducts are upper normal after cholecystectomy. Musculoskeletal: No acute osseous abnormality. Review of the MIP images confirms the above findings. IMPRESSION: 1.  No evidence of pulmonary embolism. 2. No other explanation for chest pain. Electronically Signed   By: Abigail Miyamoto M.D.   On: 11/16/2015 22:08    MDM  Cardiac risk factors hypertension, hypercholesterolemia, family history. Heart score equals 4 based on risk factors, EKG criteria and age however story is highly atypical. Patient describes pleuritic chest pain  which is nonexertional accompanied by cough. I feel that she can have outpatient cardiac workup given 2 negative troponins Plan follow-up with [facility] as outpatient Final diagnoses:  None   Diagnosis atypical chest pain     Orlie Dakin, MD 11/16/15 [id number]

## 2015-11-18 ENCOUNTER — Ambulatory Visit
Admission: RE | Admit: 2015-11-18 | Discharge: 2015-11-18 | Disposition: A | Discharge status: Home / Self Care | Payer: BLUE CROSS/BLUE SHIELD | Source: Ambulatory Visit

## 2015-11-18 DIAGNOSIS — Z1231 Encounter for screening mammogram for malignant neoplasm of breast: Secondary | ICD-10-CM

## 2015-12-13 ENCOUNTER — Ambulatory Visit: Payer: BLUE CROSS/BLUE SHIELD | Admitting: Internal Medicine

## 2015-12-16 ENCOUNTER — Ambulatory Visit (INDEPENDENT_AMBULATORY_CARE_PROVIDER_SITE_OTHER): Payer: BLUE CROSS/BLUE SHIELD | Admitting: Internal Medicine

## 2015-12-16 ENCOUNTER — Encounter: Payer: Self-pay | Admitting: Internal Medicine

## 2015-12-16 VITALS — BP 126/88 | HR 95 | Temp 99.0°F | Resp 20 | Ht 66.0 in | Wt 215.0 lb

## 2015-12-16 DIAGNOSIS — N183 Chronic kidney disease, stage 3 unspecified: Secondary | ICD-10-CM

## 2015-12-16 DIAGNOSIS — I10 Essential (primary) hypertension: Secondary | ICD-10-CM | POA: Diagnosis not present

## 2015-12-16 DIAGNOSIS — E785 Hyperlipidemia, unspecified: Secondary | ICD-10-CM | POA: Diagnosis not present

## 2015-12-16 NOTE — Progress Notes (Signed)
Pre visit review using our clinic review tool, if applicable. No additional management support is needed unless otherwise documented below in the visit note. 

## 2015-12-16 NOTE — Progress Notes (Signed)
Subjective:    Patient ID: Rhonda [name], female    DOB: 10/27/1970, 45 y.o.   MRN: [id number]  HPI BP Readings from Last 3 Encounters:  12/16/15 126/88  11/16/15 133/95  08/02/15 72/67   45 year old patient who is seen today for her six-month follow-up.  She was seen in the ED last month with atypical chest pain.  Chest CTA negative.  No recurrent pain.  She does have a history of chronic kidney disease and hypertension.  Otherwise, doing well.  No new concerns or complaints today.  Past Medical History  Diagnosis Date  . Allergy   . Hypertension   . IBS (irritable bowel syndrome)   . Hyperlipemia   . Anxiety   . GERD (gastroesophageal reflux disease)   . Chronic constipation   . Thyroid disease   . Nephropathy   . Kidney disease      Social History   Social History  . Marital Status: Divorced    Spouse Name: N/A  . Number of Children: 2  . Years of Education: N/A   Occupational History  . NURSING ASSISTANT    Social History Main Topics  . Smoking status: Never Smoker   . Smokeless tobacco: Never Used  . Alcohol Use: No  . Drug Use: No  . Sexual Activity: Yes    Birth Control/ Protection: Surgical   Other Topics Concern  . Not on file   Social History Narrative    Past Surgical History  Procedure Laterality Date  . Abdominal hysterectomy  1998  . Cholecystectomy  1997  . Esophagogastroduodenoscopy    . Tubal ligation    . Thyroid surgery      Family History  Problem Relation Age of Onset  . Hypertension Father   . Coronary artery disease Father   . Colon cancer Neg Hx   . Colon polyps Neg Hx   . Kidney disease Neg Hx   . Esophageal cancer Neg Hx   . Gallbladder disease Neg Hx   . Diabetes Father   . Diabetes Mother     Pre-diabetic    Allergies  Allergen Reactions  . Aspirin Hives  . Dilaudid [Hydromorphone] Itching  . Flagyl [Metronidazole] Nausea And Vomiting    With oral flagyl.   Has tolerated vaginal flagyl.    . Food Other  (See Comments)    Lima beans  . Tramadol Other (See Comments)    Headache     Current Outpatient Prescriptions on File Prior to Visit  Medication Sig Dispense Refill  . acyclovir cream (ZOVIRAX) 5 % Apply 1 application topically 3 (three) times daily. 15 g 1  . albuterol (PROVENTIL HFA;VENTOLIN HFA) 108 (90 BASE) MCG/ACT inhaler Inhale 2 puffs into the lungs every 6 (six) hours as needed for wheezing or shortness of breath. 1 Inhaler 5  . ALPRAZolam (XANAX) 0.25 MG tablet Take 1 tablet (0.25 mg total) by mouth 2 (two) times daily as needed for anxiety. 60 tablet 2  . alum & mag hydroxide-simeth (MAALOX/MYLANTA) 200-200-20 MG/5ML suspension Take 30 mLs by mouth every 6 (six) hours as needed for indigestion or heartburn.     Marland Kitchen amLODipine (NORVASC) 5 MG tablet TAKE 1 TABLET BY MOUTH EVERY DAY 30 tablet 5  . cyclobenzaprine (FLEXERIL) 10 MG tablet Take 1 tablet (10 mg total) by mouth every 8 (eight) hours as needed for muscle spasms. 10 tablet 0  . docusate sodium (COLACE) 100 MG capsule Take 100 mg by mouth daily.     Marland Kitchen  levothyroxine (SYNTHROID, LEVOTHROID) 150 MCG tablet TAKE 1 TABLET BY MOUTH EVERY DAY BEFORE BREAKFAST 30 tablet 5  . loratadine (CLARITIN) 10 MG tablet Take 10 mg by mouth daily.    . metoprolol succinate (TOPROL-XL) 50 MG 24 hr tablet TAKE 1 TABLET BY MOUTH EVERY DAY IMMEDIATELY FOLLOWING A MEAL 30 tablet 5  . Multiple Vitamin (MULTIVITAMIN) tablet Take 1 tablet by mouth daily.      Marland Kitchen omeprazole (PRILOSEC) 40 MG capsule Take 1 capsule (40 mg total) by mouth daily. (Patient taking differently: Take 40 mg by mouth 2 (two) times daily as needed (acid reflux). ) 30 capsule 11  . promethazine (PHENERGAN) 25 MG tablet Take 1 tablet (25 mg total) by mouth every 6 (six) hours as needed for nausea or vomiting. 30 tablet 2  . valsartan (DIOVAN) 320 MG tablet Take 320 mg by mouth daily.  5   No current facility-administered medications on file prior to visit.    BP 126/88 mmHg  Pulse  95  Temp(Src) 99 F (37.2 C) (Oral)  Resp 20  Ht 5\' 6"  (1.676 m)  Wt 215 lb (97.523 kg)  BMI 34.72 kg/m2  SpO2 98%     Review of Systems  Constitutional: Negative.   HENT: Negative for congestion, dental problem, hearing loss, rhinorrhea, sinus pressure, sore throat and tinnitus.   Eyes: Negative for pain, discharge and visual disturbance.  Respiratory: Negative for cough and shortness of breath.   Cardiovascular: Positive for chest pain. Negative for palpitations and leg swelling.  Gastrointestinal: Negative for nausea, vomiting, abdominal pain, diarrhea, constipation, blood in stool and abdominal distention.  Genitourinary: Negative for dysuria, urgency, frequency, hematuria, flank pain, vaginal bleeding, vaginal discharge, difficulty urinating, vaginal pain and pelvic pain.  Musculoskeletal: Negative for joint swelling, arthralgias and gait problem.  Skin: Negative for rash.  Neurological: Negative for dizziness, syncope, speech difficulty, weakness, numbness and headaches.  Hematological: Negative for adenopathy.  Psychiatric/Behavioral: Negative for behavioral problems, dysphoric mood and agitation. The patient is not nervous/anxious.        Objective:   Physical Exam  Constitutional: She is oriented to person, place, and time. She appears well-developed and well-nourished.  HENT:  Head: Normocephalic.  Right Ear: External ear normal.  Left Ear: External ear normal.  Mouth/Throat: Oropharynx is clear and moist.  Eyes: Conjunctivae and EOM are normal. Pupils are equal, round, and reactive to light.  Neck: Normal range of motion. Neck supple. No thyromegaly present.  Cardiovascular: Normal rate, regular rhythm, normal heart sounds and intact distal pulses.   Pulmonary/Chest: Effort normal and breath sounds normal.  Abdominal: Soft. Bowel sounds are normal. She exhibits no mass. There is no tenderness.  Musculoskeletal: Normal range of motion.  Lymphadenopathy:    She has  no cervical adenopathy.  Neurological: She is alert and oriented to person, place, and time.  Skin: Skin is warm and dry. No rash noted.  Psychiatric: She has a normal mood and affect. Her behavior is normal.          Assessment & Plan:   Essential hypertension, stable Chronic kidney disease, stable History of atypical chest pain.  No recurrence  Follow-up 6 months

## 2015-12-16 NOTE — Patient Instructions (Signed)
Limit your sodium (Salt) intake    It is important that you exercise regularly, at least 20 minutes 3 to 4 times per week.  If you develop chest pain or shortness of breath seek  medical attention.  Please check your blood pressure on a regular basis.  If it is consistently greater than 150/90, please make an office appointment.  You need to lose weight.  Consider a lower calorie diet and regular exercise.  Return in 6 months for follow-up

## 2016-01-27 ENCOUNTER — Encounter: Payer: Self-pay | Admitting: Gastroenterology

## 2016-01-27 ENCOUNTER — Ambulatory Visit (INDEPENDENT_AMBULATORY_CARE_PROVIDER_SITE_OTHER): Payer: 59 | Admitting: Gastroenterology

## 2016-01-27 VITALS — BP 138/90 | HR 72 | Ht 65.5 in | Wt 214.4 lb

## 2016-01-27 DIAGNOSIS — K219 Gastro-esophageal reflux disease without esophagitis: Secondary | ICD-10-CM

## 2016-01-27 DIAGNOSIS — K589 Irritable bowel syndrome without diarrhea: Secondary | ICD-10-CM | POA: Diagnosis not present

## 2016-01-27 DIAGNOSIS — K5909 Other constipation: Secondary | ICD-10-CM

## 2016-01-27 DIAGNOSIS — K625 Hemorrhage of anus and rectum: Secondary | ICD-10-CM

## 2016-01-27 MED ORDER — SUCRALFATE 1 GM/10ML PO SUSP: 1.0000 g | Freq: Every day | ORAL | Status: DC

## 2016-01-27 NOTE — Progress Notes (Signed)
[facility] Progress Note  Chief Complaint: GERD and lower abdominal pain  Subjective History:  Rhonda [name] is seeing me for the first time for the above complaints, she previously saw Dr. Deatra Ina. Her last visit was in March 2016 for GERD and constipation with hemorrhoidal bleeding. She had an upper endoscopy in August 2012 with placement of a bravo capsule (presently unknown results from that study, he reported "moderate reflux"), as well as a colonoscopy in July 2012. Similar symptoms have been ongoing since her last visit here. She has nocturnal and early a.m. symptoms of GERD with pyrosis regurgitation bloating and fullness. Her bowel habits alternate constipation and diarrhea about half the time each, she also has chronic bandlike lower abdominal pain before bowel movements. She might feel "sick" for a while after BMs. The symptoms have been going on for many years and are becoming increasingly bothersome. Dr. Deatra Ina land to try lesions S, but she cannot recall getting that, and excellent was too expensive. Saw Deatra Ina 10/2014 for GERD and constipation with HR bleeding.  Prior EGD and colonoscopy.  He planned Linzess and Dexilant (too $$)   ROS: Cardiovascular:  no chest pain Respiratory: no dyspnea  The patient's Past Medical, Family and Social History were reviewed and are on file in the EMR.  Objective:  Med list reviewed  Vital signs in last 24 hrs: Filed Vitals:   01/27/16 1324  BP: 138/90  Pulse: 72    Physical Exam   HEENT: sclera anicteric, oral mucosa moist without lesions  Neck: supple, no thyromegaly, JVD or lymphadenopathy  Cardiac: RRR without murmurs, S1S2 heard, no peripheral edema  Pulm: clear to auscultation bilaterally, normal RR and effort noted  Abdomen: soft, mild LLQ tenderness, with active bowel sounds. No guarding or palpable hepatosplenomegaly.  Skin; warm and dry, no jaundice or rash   Previous endoscopy reports as noted  above   @ASSESSMENTPLANBEGIN @ Assessment: Encounter Diagnoses  Name Primary?  . Gastroesophageal reflux disease without esophagitis Yes  . Other constipation   . IBS (irritable bowel syndrome)   . Hemorrhage of rectum and anus    She appears to have IBS with associated hemorrhoidal bleeding and nonerosive reflux disease. We had a long discussion about both of these conditions, the limited available therapies, and the need for diet and lifestyle changes to improve both. There was no hiatal hernia on the EGD, and I think there is likely an element of visceral hypersensitivity, limiting the benefit of and increasing the risks of fundoplication.  Plan: Trial of Carafate at bedtime Trial of low-dose Carafate 72 g once daily   Nelida Meuse III

## 2016-01-27 NOTE — Patient Instructions (Signed)
Medication Samples have been provided to the patient.  Drug name: Linzess       Strength: 72 mcg        Qty: 16 capsules  LOT: [id number]  Exp.Date: 01-2017  Dosing instructions: ONE BY MOUTH DAILY  The patient has been instructed regarding the correct time, dose, and frequency of taking this medication, including desired effects and most common side effects.   Elias Else 1:57 PM 01/27/2016   If you are age 47 or older, your body mass index should be between 23-30. Your Body mass index is 35.12 kg/(m^2). If this is out of the aforementioned range listed, please consider follow up with your Primary Care Provider.  If you are age 46 or younger, your body mass index should be between 19-25. Your Body mass index is 35.12 kg/(m^2). If this is out of the aformentioned range listed, please consider follow up with your Primary Care Provider.   Thank you for choosing [facility]  Dr Wilfrid Lund III

## 2016-02-24 ENCOUNTER — Encounter: Payer: Self-pay | Admitting: Internal Medicine

## 2016-02-24 ENCOUNTER — Ambulatory Visit (INDEPENDENT_AMBULATORY_CARE_PROVIDER_SITE_OTHER): Payer: 59 | Admitting: Internal Medicine

## 2016-02-24 VITALS — BP 130/90 | HR 73 | Temp 98.9°F | Resp 20 | Ht 65.5 in | Wt 216.0 lb

## 2016-02-24 DIAGNOSIS — J3089 Other allergic rhinitis: Secondary | ICD-10-CM | POA: Diagnosis not present

## 2016-02-24 DIAGNOSIS — I10 Essential (primary) hypertension: Secondary | ICD-10-CM

## 2016-02-24 MED ORDER — FLUTICASONE PROPIONATE 50 MCG/ACT NA SUSP: 2.0000 | Freq: Every day | NASAL | Status: DC

## 2016-02-24 MED ORDER — PREDNISONE 20 MG PO TABS
20.0000 mg | ORAL_TABLET | Freq: Two times a day (BID) | ORAL | Status: DC
Start: 2016-02-24 — End: 2016-03-19

## 2016-02-24 NOTE — Progress Notes (Signed)
Pre visit review using our clinic review tool, if applicable. No additional management support is needed unless otherwise documented below in the visit note. 

## 2016-02-24 NOTE — Progress Notes (Signed)
Subjective:    Patient ID: Rhonda [name], female    DOB: March 26, 1971, 45 y.o.   MRN: [id number]  HPI  45 year old patient who has essential hypertension and also a history of allergic rhinitis.  She does take Claritin daily. One week ago, she suffered from GI upset with fever, nausea, vomiting and diarrhea.  Since that time she has had persistent headache and has developed the bilateral ear discomfort.  She describes sinus congestion.  No rhinorrhea.  There is been no recent fever.  Past Medical History  Diagnosis Date  . Allergy   . Hypertension   . IBS (irritable bowel syndrome)   . Hyperlipemia   . Anxiety   . GERD (gastroesophageal reflux disease)   . Chronic constipation   . Thyroid disease   . Nephropathy   . Kidney disease      Social History   Social History  . Marital Status: Divorced    Spouse Name: N/A  . Number of Children: 2  . Years of Education: N/A   Occupational History  . NURSING ASSISTANT    Social History Main Topics  . Smoking status: Never Smoker   . Smokeless tobacco: Never Used  . Alcohol Use: No  . Drug Use: No  . Sexual Activity: Yes    Birth Control/ Protection: Surgical   Other Topics Concern  . Not on file   Social History Narrative    Past Surgical History  Procedure Laterality Date  . Abdominal hysterectomy  1998  . Cholecystectomy  1997  . Esophagogastroduodenoscopy    . Tubal ligation    . Thyroid surgery      Family History  Problem Relation Age of Onset  . Hypertension Father   . Coronary artery disease Father   . Colon cancer Neg Hx   . Colon polyps Neg Hx   . Kidney disease Neg Hx   . Esophageal cancer Neg Hx   . Gallbladder disease Neg Hx   . Diabetes Father   . Diabetes Mother     Pre-diabetic    Allergies  Allergen Reactions  . Aspirin Hives  . Dilaudid [Hydromorphone] Itching  . Flagyl [Metronidazole] Nausea And Vomiting    With oral flagyl.   Has tolerated vaginal flagyl.    . Food Other (See  Comments)    Lima beans  . Tramadol Other (See Comments)    Headache     Current Outpatient Prescriptions on File Prior to Visit  Medication Sig Dispense Refill  . acyclovir cream (ZOVIRAX) 5 % Apply 1 application topically 3 (three) times daily. 15 g 1  . albuterol (PROVENTIL HFA;VENTOLIN HFA) 108 (90 BASE) MCG/ACT inhaler Inhale 2 puffs into the lungs every 6 (six) hours as needed for wheezing or shortness of breath. 1 Inhaler 5  . ALPRAZolam (XANAX) 0.25 MG tablet Take 1 tablet (0.25 mg total) by mouth 2 (two) times daily as needed for anxiety. 60 tablet 2  . alum & mag hydroxide-simeth (MAALOX/MYLANTA) 200-200-20 MG/5ML suspension Take 30 mLs by mouth every 6 (six) hours as needed for indigestion or heartburn.     Marland Kitchen amLODipine (NORVASC) 5 MG tablet TAKE 1 TABLET BY MOUTH EVERY DAY 30 tablet 5  . cyclobenzaprine (FLEXERIL) 10 MG tablet Take 1 tablet (10 mg total) by mouth every 8 (eight) hours as needed for muscle spasms. 10 tablet 0  . docusate sodium (COLACE) 100 MG capsule Take 100 mg by mouth daily.     Marland Kitchen levothyroxine (SYNTHROID, LEVOTHROID) 150 MCG  tablet TAKE 1 TABLET BY MOUTH EVERY DAY BEFORE BREAKFAST 30 tablet 5  . loratadine (CLARITIN) 10 MG tablet Take 10 mg by mouth daily.    . metoprolol succinate (TOPROL-XL) 50 MG 24 hr tablet TAKE 1 TABLET BY MOUTH EVERY DAY IMMEDIATELY FOLLOWING A MEAL 30 tablet 5  . Multiple Vitamin (MULTIVITAMIN) tablet Take 1 tablet by mouth daily.      Marland Kitchen omeprazole (PRILOSEC) 40 MG capsule Take 1 capsule (40 mg total) by mouth daily. (Patient taking differently: Take 40 mg by mouth 2 (two) times daily as needed (acid reflux). ) 30 capsule 11  . promethazine (PHENERGAN) 25 MG tablet Take 1 tablet (25 mg total) by mouth every 6 (six) hours as needed for nausea or vomiting. 30 tablet 2  . valsartan (DIOVAN) 320 MG tablet Take 320 mg by mouth daily.  5   No current facility-administered medications on file prior to visit.    BP 130/90 mmHg  Pulse 73   Temp(Src) 98.9 F (37.2 C) (Oral)  Resp 20  Ht 5' 5.5" (1.664 m)  Wt 216 lb (97.977 kg)  BMI 35.38 kg/m2  SpO2 99%     Review of Systems  Constitutional: Positive for fatigue.  HENT: Positive for congestion and sinus pressure. Negative for dental problem, hearing loss, postnasal drip, rhinorrhea, sore throat and tinnitus.   Eyes: Negative for pain, discharge and visual disturbance.  Respiratory: Negative for cough and shortness of breath.   Cardiovascular: Negative for chest pain, palpitations and leg swelling.  Gastrointestinal: Negative for nausea, vomiting, abdominal pain, diarrhea, constipation, blood in stool and abdominal distention.  Genitourinary: Negative for dysuria, urgency, frequency, hematuria, flank pain, vaginal bleeding, vaginal discharge, difficulty urinating, vaginal pain and pelvic pain.  Musculoskeletal: Negative for joint swelling, arthralgias and gait problem.  Skin: Negative for rash.  Neurological: Positive for headaches. Negative for dizziness, syncope, speech difficulty, weakness and numbness.  Hematological: Negative for adenopathy.  Psychiatric/Behavioral: Negative for behavioral problems, dysphoric mood and agitation. The patient is not nervous/anxious.        Objective:   Physical Exam  Constitutional: She is oriented to person, place, and time. She appears well-developed and well-nourished.  HENT:  Head: Normocephalic.  Right Ear: External ear normal.  Left Ear: External ear normal.  Mouth/Throat: Oropharynx is clear and moist.  No focal sinus tenderness  Eyes: Conjunctivae and EOM are normal. Pupils are equal, round, and reactive to light.  Neck: Normal range of motion. Neck supple. No thyromegaly present.  Cardiovascular: Normal rate, regular rhythm, normal heart sounds and intact distal pulses.   Pulmonary/Chest: Effort normal and breath sounds normal.  Abdominal: Soft. Bowel sounds are normal. She exhibits no mass. There is no tenderness.    Musculoskeletal: Normal range of motion.  Lymphadenopathy:    She has no cervical adenopathy.  Neurological: She is alert and oriented to person, place, and time.  Skin: Skin is warm and dry. No rash noted.  Psychiatric: She has a normal mood and affect. Her behavior is normal.          Assessment & Plan:  Flare allergic rhinitis Hypertension, well-controlled  We'll treat with hydration and brief course of oral prednisone   Nyoka Cowden, MD

## 2016-02-24 NOTE — Patient Instructions (Signed)
Use saline irrigation, warm  moist compresses and over-the-counter decongestants only as directed.  Call if there is no improvement in 5 to 7 days, or sooner if you develop increasing pain, fever, or any new symptoms.  HOME CARE INSTRUCTIONS  Drink plenty of water. Water helps thin the mucus so your sinuses can drain more easily.  Use a humidifier.  Inhale steam 3-4 times a day (for example, sit in the bathroom with the shower running).  Apply a warm, moist washcloth to your face 3-4 times a day, or as directed by your health care provider.  Use saline nasal sprays to help moisten and clean your sinuses.  Take medicines only as directed by your health care provider.  

## 2016-03-09 NOTE — H&P (Signed)
  Patient name Rhonda [name], Hamed [id number] CSN# [id number]  Darlyn Chamber, MD 03/09/2016 1:48 PM

## 2016-03-10 NOTE — H&P (Signed)
[name], [name] NO.:  [id number]  MEDICAL RECORD NO.:  [id number]  LOCATION:                                 FACILITY:  PHYSICIAN:  Darlyn Chamber, M.D.   DATE OF BIRTH:  09/08/1970  DATE OF ADMISSION:  03/26/2016 DATE OF DISCHARGE:                             HISTORY & PHYSICAL   Date of her surgery is March 26, 2016, done at [facility] on [address].  HISTORY OF PRESENT ILLNESS:  The patient is a 45 year old, gravida 3, para 2 female, who presents for diagnostic laparoscopy.  The patient had a previous total abdominal hysterectomy done for pain in the past.  She has been having trouble with worsening pelvic pain and discomfort. Ultrasound evaluation was basically unremarkable.  She had a GI workup and felt that it was more gynecologically related.  We now can proceed with laparoscopic evaluation to look for such things as pelvic adhesions.  ALLERGIES:  In terms of allergies, she is allergic to ASPIRIN.  MEDICATIONS:  She is on Diovan 160 mg, metoprolol 50 mg, amlodipine 5 mg, Synthroid 150 mcg, omeprazole 40 mg, and Claritin as needed.  PAST MEDICAL HISTORY:  She has a history of hypertension.  Also does have a history of chronic nephritis.  She does have some decrease in kidney function and does have proteinuria.  She has a history of hypothyroidism with a previous thyroidectomy in 1991.  PAST SURGICAL HISTORY:  She has had her gallbladder removed in 1997, tubes tied in 1992, hysterectomy in 1998, and thyroidectomy in 1991.  SOCIAL HISTORY:  Reveals no tobacco or alcohol use.  FAMILY HISTORY:  Noncontributory.  REVIEW OF SYSTEMS:  Noncontributory.  PHYSICAL EXAMINATION:  GENERAL:  The patient is afebrile with stable vital signs. HEENT:  The patient is normocephalic.  Pupils are equal, round and reactive to light and accommodation.  Extraocular movements were intact. Sclerae and conjunctivae were clear.  Oropharynx  was clear. BREASTS:  No discrete masses. LUNGS:  Clear. CARDIOVASCULAR SYSTEM:  Regular rhythm and rate without murmurs or gallops. ABDOMEN:  Benign.  No mass, organomegaly or tenderness. PELVIC:  Normal external genitalia.  Vaginal mucosa is clear.  Cuff intact.  Bimanual exam unremarkable. EXTREMITIES:  Trace edema. NEUROLOGIC:  Grossly within normal limits.  IMPRESSION: 1. Pelvic pain possibly secondary to pelvic adhesions. 2. Hypertension. 3. History of chronic nephritis.  PLAN:  The patient will undergo diagnostic laparoscopy.  The risks of surgery have been discussed including the risk of infection.  Risk of hemorrhage that could require transfusion with the risk of AIDS or hepatitis.  Risk of injury to adjacent organs including bladder, bowel, ureters that could require further exploratory surgery.  Risk of deep venous thrombosis and pulmonary embolus.  The patient expressed understanding of indications and risks.     Darlyn Chamber, M.D.   ______________________________ Darlyn Chamber, M.D.    JSM/MEDQ  D:  03/09/2016  T:  03/10/2016  Job:  [id number]

## 2016-03-19 ENCOUNTER — Encounter (HOSPITAL_BASED_OUTPATIENT_CLINIC_OR_DEPARTMENT_OTHER): Payer: Self-pay | Admitting: *Deleted

## 2016-03-19 NOTE — Progress Notes (Addendum)
NPO AFTER MN.  ARRIVE AT 0600.  GETTING LAB WORK DONE Monday 03-23-2016 (CBC, CMET).  CURRENT EKG IN CHART AND EPIC. WILL TAKE AM MEDS DOS W/ SIPS OF WATER.   CALLED AND SPOKE W/ PT ABOUT LAB WORK, RESCHEDULED ON Tuesday 03-24-2016/

## 2016-03-24 DIAGNOSIS — Z90710 Acquired absence of both cervix and uterus: Secondary | ICD-10-CM | POA: Diagnosis not present

## 2016-03-24 DIAGNOSIS — Z79899 Other long term (current) drug therapy: Secondary | ICD-10-CM | POA: Diagnosis not present

## 2016-03-24 DIAGNOSIS — I10 Essential (primary) hypertension: Secondary | ICD-10-CM | POA: Diagnosis not present

## 2016-03-24 DIAGNOSIS — N736 Female pelvic peritoneal adhesions (postinfective): Secondary | ICD-10-CM | POA: Diagnosis not present

## 2016-03-24 DIAGNOSIS — E890 Postprocedural hypothyroidism: Secondary | ICD-10-CM | POA: Diagnosis not present

## 2016-03-24 DIAGNOSIS — K219 Gastro-esophageal reflux disease without esophagitis: Secondary | ICD-10-CM | POA: Diagnosis not present

## 2016-03-24 DIAGNOSIS — R102 Pelvic and perineal pain: Secondary | ICD-10-CM | POA: Diagnosis present

## 2016-03-24 DIAGNOSIS — F419 Anxiety disorder, unspecified: Secondary | ICD-10-CM | POA: Diagnosis not present

## 2016-03-24 DIAGNOSIS — K589 Irritable bowel syndrome without diarrhea: Secondary | ICD-10-CM | POA: Diagnosis not present

## 2016-03-24 DIAGNOSIS — Z86711 Personal history of pulmonary embolism: Secondary | ICD-10-CM | POA: Diagnosis not present

## 2016-03-24 LAB — CBC
HCT: 39.2 % (ref 36.0–46.0)
Hemoglobin: 13.1 g/dL (ref 12.0–15.0)
MCH: 28.5 pg (ref 26.0–34.0)
MCHC: 33.4 g/dL (ref 30.0–36.0)
MCV: 85.4 fL (ref 78.0–100.0)
PLATELETS: 280 10*3/uL (ref 150–400)
RBC: 4.59 MIL/uL (ref 3.87–5.11)
RDW: 13.2 % (ref 11.5–15.5)
WBC: 7.4 10*3/uL (ref 4.0–10.5)

## 2016-03-24 LAB — COMPREHENSIVE METABOLIC PANEL
ALK PHOS: 79 U/L (ref 38–126)
ALT: 16 U/L (ref 14–54)
ANION GAP: 6 (ref 5–15)
AST: 18 U/L (ref 15–41)
Albumin: 3.8 g/dL (ref 3.5–5.0)
BUN: 13 mg/dL (ref 6–20)
CALCIUM: 9.5 mg/dL (ref 8.9–10.3)
CHLORIDE: 106 mmol/L (ref 101–111)
CO2: 26 mmol/L (ref 22–32)
Creatinine, Ser: 1.16 mg/dL — ABNORMAL HIGH (ref 0.44–1.00)
GFR calc non Af Amer: 55 mL/min — ABNORMAL LOW (ref >60)
Glucose, Bld: 115 mg/dL — ABNORMAL HIGH (ref 65–99)
POTASSIUM: 4.3 mmol/L (ref 3.5–5.1)
SODIUM: 138 mmol/L (ref 135–145)
Total Bilirubin: 0.3 mg/dL (ref 0.3–1.2)
Total Protein: 7.8 g/dL (ref 6.5–8.1)

## 2016-03-25 NOTE — Anesthesia Preprocedure Evaluation (Addendum)
Anesthesia Evaluation  Patient identified by MRN, date of birth, ID band Patient awake    Reviewed: Allergy & Precautions, NPO status , Patient's Chart, lab work & pertinent test results  Airway Mallampati: III  TM Distance: >3 FB Neck ROM: Full    Dental no notable dental hx.    Pulmonary neg pulmonary ROS,    Pulmonary exam normal        Cardiovascular hypertension, Pt. on medications and Pt. on home beta blockers Normal cardiovascular exam Rhythm:Regular Rate:Normal  History of Pe, not currently requiring AC   Neuro/Psych PSYCHIATRIC DISORDERS Anxiety negative neurological ROS     GI/Hepatic Neg liver ROS, GERD  Medicated and Controlled,IBS   Endo/Other  Hypothyroidism (s/p thyroidectomy)   Renal/GU CRFRenal disease   Previous hysterectomy, now with chronic pelvic pain    Musculoskeletal negative musculoskeletal ROS (+)   Abdominal   Peds  Hematology   Anesthesia Other Findings   Reproductive/Obstetrics                            Anesthesia Physical Anesthesia Plan  ASA: II  Anesthesia Plan: General   Post-op Pain Management:    Induction: Intravenous  Airway Management Planned: Oral ETT  Additional Equipment: None  Intra-op Plan:   Post-operative Plan: Extubation in OR  Informed Consent: I have reviewed the patients History and Physical, chart, labs and discussed the procedure including the risks, benefits and alternatives for the proposed anesthesia with the patient or authorized representative who has indicated his/her understanding and acceptance.   Dental advisory given  Plan Discussed with:   Anesthesia Plan Comments:        Anesthesia Quick Evaluation

## 2016-03-26 ENCOUNTER — Ambulatory Visit (HOSPITAL_BASED_OUTPATIENT_CLINIC_OR_DEPARTMENT_OTHER)
Admission: RE | Admit: 2016-03-26 | Discharge: 2016-03-26 | Disposition: A | Discharge status: Home / Self Care | Payer: 59 | Source: Ambulatory Visit | Attending: Obstetrics and Gynecology | Admitting: Obstetrics and Gynecology

## 2016-03-26 ENCOUNTER — Encounter (HOSPITAL_BASED_OUTPATIENT_CLINIC_OR_DEPARTMENT_OTHER): Payer: Self-pay | Admitting: *Deleted

## 2016-03-26 ENCOUNTER — Ambulatory Visit (HOSPITAL_BASED_OUTPATIENT_CLINIC_OR_DEPARTMENT_OTHER): Payer: 59 | Admitting: Anesthesiology

## 2016-03-26 ENCOUNTER — Encounter (HOSPITAL_BASED_OUTPATIENT_CLINIC_OR_DEPARTMENT_OTHER)
Admission: RE | Disposition: A | Discharge status: Home / Self Care | Payer: Self-pay | Source: Ambulatory Visit | Attending: Obstetrics and Gynecology

## 2016-03-26 DIAGNOSIS — Z86711 Personal history of pulmonary embolism: Secondary | ICD-10-CM | POA: Insufficient documentation

## 2016-03-26 DIAGNOSIS — Z79899 Other long term (current) drug therapy: Secondary | ICD-10-CM | POA: Insufficient documentation

## 2016-03-26 DIAGNOSIS — E890 Postprocedural hypothyroidism: Secondary | ICD-10-CM | POA: Insufficient documentation

## 2016-03-26 DIAGNOSIS — K589 Irritable bowel syndrome without diarrhea: Secondary | ICD-10-CM | POA: Insufficient documentation

## 2016-03-26 DIAGNOSIS — K219 Gastro-esophageal reflux disease without esophagitis: Secondary | ICD-10-CM | POA: Insufficient documentation

## 2016-03-26 DIAGNOSIS — I10 Essential (primary) hypertension: Secondary | ICD-10-CM | POA: Insufficient documentation

## 2016-03-26 DIAGNOSIS — N736 Female pelvic peritoneal adhesions (postinfective): Secondary | ICD-10-CM | POA: Diagnosis present

## 2016-03-26 DIAGNOSIS — Z90710 Acquired absence of both cervix and uterus: Secondary | ICD-10-CM | POA: Insufficient documentation

## 2016-03-26 DIAGNOSIS — F419 Anxiety disorder, unspecified: Secondary | ICD-10-CM | POA: Insufficient documentation

## 2016-03-26 HISTORY — DX: Personal history of other specified conditions: Z87.898

## 2016-03-26 HISTORY — PX: LAPAROSCOPY: SHX197

## 2016-03-26 HISTORY — DX: Other specified disorders of kidney and ureter: N28.89

## 2016-03-26 HISTORY — DX: Chronic kidney disease, stage 2 (mild): N18.2

## 2016-03-26 HISTORY — DX: Pelvic and perineal pain unspecified side: R10.20

## 2016-03-26 HISTORY — DX: Pelvic and perineal pain: R10.2

## 2016-03-26 HISTORY — DX: Personal history of irradiation: Z92.3

## 2016-03-26 HISTORY — DX: Family history of other specified conditions: Z84.89

## 2016-03-26 HISTORY — DX: Proteinuria, unspecified: R80.9

## 2016-03-26 HISTORY — DX: Other allergic rhinitis: J30.89

## 2016-03-26 HISTORY — DX: Postprocedural hypothyroidism: E89.0

## 2016-03-26 HISTORY — DX: Disorder of kidney and ureter, unspecified: N28.9

## 2016-03-26 HISTORY — DX: Personal history of pulmonary embolism: Z86.711

## 2016-03-26 HISTORY — DX: Presence of spectacles and contact lenses: Z97.3

## 2016-03-26 HISTORY — DX: Other seasonal allergic rhinitis: J30.2

## 2016-03-26 SURGERY — LAPAROSCOPY, DIAGNOSTIC
Anesthesia: General | Site: Abdomen

## 2016-03-26 MED ORDER — CEFAZOLIN SODIUM-DEXTROSE 2-4 GM/100ML-% IV SOLN
2.0000 g | INTRAVENOUS | Status: AC
  Administered 2016-03-26: 2 g via INTRAVENOUS
  Filled 2016-03-26: qty 100

## 2016-03-26 MED ORDER — MEPERIDINE HCL 25 MG/ML IJ SOLN
6.2500 mg | INTRAMUSCULAR | Status: DC | PRN
  Filled 2016-03-26: qty 1

## 2016-03-26 MED ORDER — LACTATED RINGERS IR SOLN
Status: DC | PRN
  Administered 2016-03-26: 3000 mL

## 2016-03-26 MED ORDER — SUCCINYLCHOLINE CHLORIDE 20 MG/ML IJ SOLN
INTRAMUSCULAR | Status: DC | PRN
  Administered 2016-03-26 (×2): 100 mg via INTRAVENOUS

## 2016-03-26 MED ORDER — BUPIVACAINE HCL 0.25 % IJ SOLN
INTRAMUSCULAR | Status: DC | PRN
  Administered 2016-03-26: 8 mL

## 2016-03-26 MED ORDER — ROCURONIUM BROMIDE 100 MG/10ML IV SOLN
INTRAVENOUS | Status: AC
  Filled 2016-03-26: qty 1

## 2016-03-26 MED ORDER — MIDAZOLAM HCL 5 MG/5ML IJ SOLN
INTRAMUSCULAR | Status: DC | PRN
  Administered 2016-03-26: 2 mg via INTRAVENOUS

## 2016-03-26 MED ORDER — LIDOCAINE HCL (CARDIAC) 20 MG/ML IV SOLN
INTRAVENOUS | Status: AC
  Filled 2016-03-26: qty 5

## 2016-03-26 MED ORDER — PROPOFOL 10 MG/ML IV BOLUS
INTRAVENOUS | Status: AC
  Filled 2016-03-26: qty 20

## 2016-03-26 MED ORDER — SODIUM CHLORIDE 0.9 % IV SOLN
INTRAVENOUS | Status: DC
  Administered 2016-03-26: 07:00:00 via INTRAVENOUS
  Filled 2016-03-26: qty 1000

## 2016-03-26 MED ORDER — CEFAZOLIN SODIUM-DEXTROSE 2-4 GM/100ML-% IV SOLN
INTRAVENOUS | Status: AC
  Filled 2016-03-26: qty 100

## 2016-03-26 MED ORDER — MIDAZOLAM HCL 2 MG/2ML IJ SOLN
INTRAMUSCULAR | Status: AC
  Filled 2016-03-26: qty 2

## 2016-03-26 MED ORDER — ONDANSETRON HCL 4 MG/2ML IJ SOLN
INTRAMUSCULAR | Status: DC | PRN
  Administered 2016-03-26: 4 mg via INTRAVENOUS

## 2016-03-26 MED ORDER — KETOROLAC TROMETHAMINE 30 MG/ML IJ SOLN
INTRAMUSCULAR | Status: DC | PRN
  Administered 2016-03-26: 30 mg via INTRAVENOUS

## 2016-03-26 MED ORDER — HYDROCODONE-ACETAMINOPHEN 7.5-325 MG PO TABS
1.0000 | ORAL_TABLET | Freq: Once | ORAL | Status: DC | PRN
  Filled 2016-03-26: qty 1

## 2016-03-26 MED ORDER — NEOSTIGMINE METHYLSULFATE 10 MG/10ML IV SOLN
INTRAVENOUS | Status: DC | PRN
  Administered 2016-03-26: 4 mg via INTRAVENOUS

## 2016-03-26 MED ORDER — GLYCOPYRROLATE 0.2 MG/ML IJ SOLN
INTRAMUSCULAR | Status: DC | PRN
  Administered 2016-03-26: 0.6 mg via INTRAVENOUS

## 2016-03-26 MED ORDER — FENTANYL CITRATE (PF) 100 MCG/2ML IJ SOLN
INTRAMUSCULAR | Status: DC | PRN
  Administered 2016-03-26 (×2): 25 ug via INTRAVENOUS
  Administered 2016-03-26: 50 ug via INTRAVENOUS

## 2016-03-26 MED ORDER — ROCURONIUM BROMIDE 100 MG/10ML IV SOLN
INTRAVENOUS | Status: DC | PRN
  Administered 2016-03-26: 40 mg via INTRAVENOUS

## 2016-03-26 MED ORDER — DEXAMETHASONE SODIUM PHOSPHATE 10 MG/ML IJ SOLN
INTRAMUSCULAR | Status: AC
  Filled 2016-03-26: qty 1

## 2016-03-26 MED ORDER — DEXAMETHASONE SODIUM PHOSPHATE 4 MG/ML IJ SOLN
INTRAMUSCULAR | Status: DC | PRN
  Administered 2016-03-26: 10 mg via INTRAVENOUS

## 2016-03-26 MED ORDER — PROPOFOL 10 MG/ML IV BOLUS
INTRAVENOUS | Status: DC | PRN
  Administered 2016-03-26: 200 mg via INTRAVENOUS

## 2016-03-26 MED ORDER — LIDOCAINE HCL (CARDIAC) 20 MG/ML IV SOLN
INTRAVENOUS | Status: DC | PRN
  Administered 2016-03-26: 100 mg via INTRAVENOUS

## 2016-03-26 MED ORDER — FENTANYL CITRATE (PF) 100 MCG/2ML IJ SOLN
INTRAMUSCULAR | Status: AC
  Filled 2016-03-26: qty 2

## 2016-03-26 MED ORDER — ONDANSETRON HCL 4 MG/2ML IJ SOLN
INTRAMUSCULAR | Status: AC
  Filled 2016-03-26: qty 2

## 2016-03-26 MED ORDER — OXYCODONE-ACETAMINOPHEN 7.5-325 MG PO TABS: 1.0000 | ORAL_TABLET | ORAL | 0 refills | Status: DC | PRN

## 2016-03-26 MED ORDER — FENTANYL CITRATE (PF) 100 MCG/2ML IJ SOLN
25.0000 ug | INTRAMUSCULAR | Status: DC | PRN
  Administered 2016-03-26 (×2): 25 ug via INTRAVENOUS
  Filled 2016-03-26: qty 1

## 2016-03-26 MED ORDER — PROMETHAZINE HCL 25 MG/ML IJ SOLN
6.2500 mg | INTRAMUSCULAR | Status: DC | PRN
Start: 2016-03-26 — End: 2016-03-26
  Filled 2016-03-26: qty 1

## 2016-03-26 SURGICAL SUPPLY — 55 items
APPLICATOR COTTON TIP 6IN STRL (MISCELLANEOUS) ×2 IMPLANT
BAG SPEC RTRVL LRG 6X4 10 (ENDOMECHANICALS)
BANDAGE ADHESIVE 1X3 (GAUZE/BANDAGES/DRESSINGS) IMPLANT
BLADE SURG 11 STRL SS (BLADE) ×2 IMPLANT
CANISTER SUCTION 1200CC (MISCELLANEOUS) IMPLANT
CANISTER SUCTION 2500CC (MISCELLANEOUS) IMPLANT
CATH ROBINSON RED A/P 16FR (CATHETERS) ×2 IMPLANT
COVER MAYO STAND STRL (DRAPES) ×2 IMPLANT
DRAPE UNDERBUTTOCKS STRL (DRAPE) ×2 IMPLANT
ELECT REM PT RETURN 9FT ADLT (ELECTROSURGICAL) ×2
ELECTRODE REM PT RTRN 9FT ADLT (ELECTROSURGICAL) ×1 IMPLANT
FILTER SMOKE EVAC LAPAROSHD (FILTER) IMPLANT
GLOVE BIO SURGEON STRL SZ 6.5 (GLOVE) ×2 IMPLANT
GLOVE BIO SURGEON STRL SZ7 (GLOVE) ×4 IMPLANT
GLOVE BIOGEL PI IND STRL 6.5 (GLOVE) IMPLANT
GLOVE BIOGEL PI INDICATOR 6.5 (GLOVE) ×2
GOWN STRL REUS W/TWL XL LVL3 (GOWN DISPOSABLE) ×4 IMPLANT
IV LACTATED RINGER IRRG 3000ML (IV SOLUTION) ×2
IV LR IRRIG 3000ML ARTHROMATIC (IV SOLUTION) IMPLANT
KIT ROOM TURNOVER WOR (KITS) ×2 IMPLANT
LEGGING LITHOTOMY PAIR STRL (DRAPES) ×2 IMPLANT
LIQUID BAND (GAUZE/BANDAGES/DRESSINGS) ×2 IMPLANT
NDL HYPO 25X1 1.5 SAFETY (NEEDLE) ×1 IMPLANT
NDL INSUFFLATION 14GA 120MM (NEEDLE) IMPLANT
NEEDLE HYPO 25X1 1.5 SAFETY (NEEDLE) ×2 IMPLANT
NEEDLE INSUFFLATION 14GA 120MM (NEEDLE) IMPLANT
NS IRRIG 500ML POUR BTL (IV SOLUTION) ×2 IMPLANT
PACK BASIN DAY SURGERY FS (CUSTOM PROCEDURE TRAY) ×2 IMPLANT
PACK LAPAROSCOPY II (CUSTOM PROCEDURE TRAY) ×2 IMPLANT
PAD ION LEFT ARM DISP (MISCELLANEOUS) ×1 IMPLANT
PAD OB MATERNITY 4.3X12.25 (PERSONAL CARE ITEMS) ×2 IMPLANT
PAD PREP 24X48 CUFFED NSTRL (MISCELLANEOUS) ×2 IMPLANT
POUCH SPECIMEN RETRIEVAL 10MM (ENDOMECHANICALS) IMPLANT
SCISSORS LAP 5X35 DISP (ENDOMECHANICALS) IMPLANT
SCISSORS LAP 5X45 EPIX DISP (ENDOMECHANICALS) ×1 IMPLANT
SEALER TISSUE G2 CVD JAW 35 (ENDOMECHANICALS) IMPLANT
SEALER TISSUE G2 CVD JAW 45CM (ENDOMECHANICALS) IMPLANT
SET IRRIG TUBING LAPAROSCOPIC (IRRIGATION / IRRIGATOR) ×1 IMPLANT
SET IRRIG Y TYPE TUR BLADDER L (SET/KITS/TRAYS/PACK) IMPLANT
SOLUTION ANTI FOG 6CC (MISCELLANEOUS) ×2 IMPLANT
SUT VIC AB 3-0 PS2 18 (SUTURE) ×4
SUT VIC AB 3-0 PS2 18XBRD (SUTURE) ×1 IMPLANT
SUT VICRYL 0 ENDOLOOP (SUTURE) IMPLANT
SUT VICRYL 0 UR6 27IN ABS (SUTURE) ×1 IMPLANT
SUT VICRYL 4-0 PS2 18IN ABS (SUTURE) IMPLANT
SYR CONTROL 10ML LL (SYRINGE) ×2 IMPLANT
SYRINGE 10CC LL (SYRINGE) IMPLANT
TOWEL OR 17X24 6PK STRL BLUE (TOWEL DISPOSABLE) ×4 IMPLANT
TRAY DSU PREP LF (CUSTOM PROCEDURE TRAY) ×2 IMPLANT
TROCAR BALLN 12MMX100 BLUNT (TROCAR) ×1 IMPLANT
TROCAR OPTI TIP 5M 100M (ENDOMECHANICALS) ×2 IMPLANT
TROCAR XCEL DIL TIP R 11M (ENDOMECHANICALS) IMPLANT
TUBING INSUFFLATION 10FT LAP (TUBING) ×2 IMPLANT
WARMER LAPAROSCOPE (MISCELLANEOUS) ×2 IMPLANT
WATER STERILE IRR 500ML POUR (IV SOLUTION) ×2 IMPLANT

## 2016-03-26 NOTE — Brief Op Note (Signed)
03/26/2016  8:13 AM  PATIENT:  Rhonda [name]  45 y.o. female  PRE-OPERATIVE DIAGNOSIS:  pelvic pain  POST-OPERATIVE DIAGNOSIS:  pelvic pain  PROCEDURE:  Procedure(s): LAPAROSCOPY DIAGNOSTIC, LYSIS OF ADHESIONS (N/A)  SURGEON:  Surgeon(s) and Role:    * Arvella Nigh, MD - Primary  PHYSICIAN ASSISTANT:   ASSISTANTS: none   ANESTHESIA:   local and general  EBL:  Total I/O In: 200 [I.V.:200] Out: -   BLOOD ADMINISTERED:none  DRAINS: none   LOCAL MEDICATIONS USED:  MARCAINE     SPECIMEN:  No Specimen  DISPOSITION OF SPECIMEN:  N/A  COUNTS:  YES  TOURNIQUET:  * No tourniquets in log *  DICTATION: .Other Dictation: Dictation Number [id number]  PLAN OF CARE: Discharge to home after PACU  PATIENT DISPOSITION:  PACU - hemodynamically stable.   Delay start of Pharmacological VTE agent (>24hrs) due to surgical blood loss or risk of bleeding: not applicable

## 2016-03-26 NOTE — Discharge Instructions (Signed)
°  Post Anesthesia Home Care Instructions ° °Activity: °Get plenty of rest for the remainder of the day. A responsible adult should stay with you for 24 hours following the procedure.  °For the next 24 hours, DO NOT: °-Drive a car °-Operate machinery °-Drink alcoholic beverages °-Take any medication unless instructed by your physician °-Make any legal decisions or sign important papers. ° °Meals: °Start with liquid foods such as gelatin or soup. Progress to regular foods as tolerated. Avoid greasy, spicy, heavy foods. If nausea and/or vomiting occur, drink only clear liquids until the nausea and/or vomiting subsides. Call your physician if vomiting continues. ° °Special Instructions/Symptoms: °Your throat may feel dry or sore from the anesthesia or the breathing tube placed in your throat during surgery. If this causes discomfort, gargle with warm salt water. The discomfort should disappear within 24 hours. ° °If you had a scopolamine patch placed behind your ear for the management of post- operative nausea and/or vomiting: ° °1. The medication in the patch is effective for 72 hours, after which it should be removed.  Wrap patch in a tissue and discard in the trash. Wash hands thoroughly with soap and water. °2. You may remove the patch earlier than 72 hours if you experience unpleasant side effects which may include dry mouth, dizziness or visual disturbances. °3. Avoid touching the patch. Wash your hands with soap and water after contact with the patch. °  °HOME CARE INSTRUCTIONS - LAPAROSCOPY ° °Wound Care: The bandaids or dressing which are placed over the skin openings may be removed the day after surgery. The incision should be kept clean and dry. The stitches do not need to be removed. Should the incision become sore, red, and swollen after the first week, check with your doctor. ° °Personal Hygiene: Shower the day after your procedure. Always wipe from front to back after elimination.  ° °Activity: Do not  drive or operate any equipment today. The effects of the anesthesia are still present and drowsiness may result. Rest today, not necessarily flat bed rest, just take it easy. You may resume your normal activity in one to three days or as instructed by your physician. ° °Sexual Activity: You resume sexual activity as indicated by your physician_________. °If your laparoscopy was for a sterilization ( tubes tied ), continue current method of birth control until after your next period or ask for specific instructions from your doctor. ° °Diet: Eat a light diet as desired this evening. You may resume a regular diet tomorrow. ° °Return to Work: Two to three days or as indicated by your doctor. ° °Expectations °After Surgery: Your surgery will cause vaginal drainage or spotting which may continue for 2-3 days. Mild abdominal discomfort or tenderness is not unusual and some shoulder pain may also be noted which can be relieved by lying flat in pain. ° °Call Your Doctor °If these Occur:  Persistent or heavy bleeding at incision site °      Redness or swelling around incision °      Elevation of temperature greater than 100 degrees F ° °Call for follow-up appointment _____________. °

## 2016-03-26 NOTE — Anesthesia Procedure Notes (Signed)
Procedure Name: Intubation Date/Time: 03/26/2016 7:33 AM Performed by: Justice Rocher Pre-anesthesia Checklist: Patient identified, Emergency Drugs available, Suction available and Patient being monitored Patient Re-evaluated:Patient Re-evaluated prior to inductionOxygen Delivery Method: Circle system utilized Preoxygenation: Pre-oxygenation with 100% oxygen Intubation Type: IV induction Ventilation: Mask ventilation without difficulty Laryngoscope Size: Mac and 4 Grade View: Grade III Tube type: Oral Tube size: 7.5 mm Number of attempts: 1 Airway Equipment and Method: Stylet and Oral airway Placement Confirmation: ETT inserted through vocal cords under direct vision,  positive ETCO2 and breath sounds checked- equal and bilateral Secured at: 21 cm Tube secured with: Tape Dental Injury: Teeth and Oropharynx as per pre-operative assessment

## 2016-03-26 NOTE — Transfer of Care (Signed)
Immediate Anesthesia Transfer of Care Note  Patient: Rhonda [name]  Procedure(s) Performed: Procedure(s) (LRB): LAPAROSCOPY DIAGNOSTIC, LYSIS OF ADHESIONS (N/A)  Patient Location: PACU  Anesthesia Type: General  Level of Consciousness: awake, sedated, patient cooperative and responds to stimulation  Airway & Oxygen Therapy: Patient Spontanous Breathing and Patient connected to face mask oxygen  Post-op Assessment: Report given to PACU RN, Post -op Vital signs reviewed and stable and Patient moving all extremities  Post vital signs: Reviewed and stable  Complications: No apparent anesthesia complications

## 2016-03-26 NOTE — Anesthesia Postprocedure Evaluation (Signed)
Anesthesia Post Note  Patient: Rhonda [name]  Procedure(s) Performed: Procedure(s) (LRB): LAPAROSCOPY DIAGNOSTIC, LYSIS OF ADHESIONS (N/A)  Patient location during evaluation: PACU Anesthesia Type: General Level of consciousness: awake and alert Pain management: pain level controlled Vital Signs Assessment: post-procedure vital signs reviewed and stable Respiratory status: spontaneous breathing, nonlabored ventilation, respiratory function stable and patient connected to nasal cannula oxygen Cardiovascular status: blood pressure returned to baseline and stable Postop Assessment: no signs of nausea or vomiting Anesthetic complications: no    Last Vitals:  Vitals:   03/26/16 0830 03/26/16 0845  BP: 138/86 131/90  Pulse: 74 78  Resp: 15 16  Temp:      Last Pain:  Vitals:   03/26/16 0825  TempSrc:   PainSc: 0-No pain                 Reginal Lutes

## 2016-03-26 NOTE — Op Note (Signed)
NAMEMAGDALYN, Rhonda [name] NO.:  [id number]  MEDICAL RECORD NO.:  [id number]  LOCATION:                                 FACILITY:  PHYSICIAN:  Darlyn Chamber, M.D.   DATE OF BIRTH:  [birth year]  DATE OF PROCEDURE:  03/26/2016 DATE OF DISCHARGE:                              OPERATIVE REPORT   PREOPERATIVE DIAGNOSIS:  Chronic pelvic pain.  POSTOPERATIVE DIAGNOSIS:  Extensive pelvic adhesions.  OPERATIVE PROCEDURES:  Open laparoscopy, lysis of adhesions.  SURGEON:  Darlyn Chamber, M.D.  ANESTHESIA:  General endotracheal.  ESTIMATED BLOOD LOSS:  Minimal.  PACKS AND DRAINS:  None.  DRAINS:  None.  INTRAOPERATIVE BLOOD REPLACEMENT:  None.  COMPLICATIONS:  None.  INDICATION:  Dictated in history and physical.  PROCEDURE IN DETAIL:  The patient was taken to the OR, placed in supine position.  After satisfactory level of general endotracheal anesthesia obtained, the patient was placed in dorsal lithotomy position using Allen stirrups.  The abdomen, perineum, and vagina were prepped out with Betadine.  Bladder was emptied by in and out catheterization.  A sponge on sponge stick was placed in the vaginal vault.  The patient was then draped in a sterile field.  The subumbilical area was infiltrated with 0.25% Marcaine.  A previous incision around the belly button was excised.  The incision was extended through the subcutaneous tissue. Fascia identified.  It was grasped with 2 Kochers.  Incision was made in the fascia and extended laterally.  At this point in time, using blunt finger pressure, able to enter the peritoneum.  The open laparoscopic trocar was put in place and secured.  Abdomen was insufflated with carbon dioxide.  Laparoscope was introduced.  Visualization revealed no evidence of injury to adjacent organs.  Upper abdomen was clear. Gallbladder was surgically absent.  Cecum and appendix were normal. Uterus was surgically absent.  A 5 mm trocar was put  in place in the suprapubic area under direct visualization.  With manipulation, both ovaries were densely adherent to the top of the vagina and to each other.  On the left side, the descending sigmoid colon was also adherent to the underside of the ovary.  The right side was more clear than the left.  We brought in the monopolar scissors.  We tried breaking up some adhesions, but they were too thick and we were so close to the bowel. We decided not to persist with this.  __________ controlled with the monopolar scissors and irrigation.  At the end of the procedure, there was no active bleeding or signs of injury to adjacent organs.  The abdomen was deflated of carbon dioxide.  All trocars were removed. Subumbilical fascia was closed with 2 figure-of-eights of 0 Vicryl. Skin was closed with interrupted subcuticulars of 4-0 Vicryl and Dermabond.  Suprapubic was incision closed with Dermabond.  Sponge on sponge stick was removed.  The patient was taken out of the dorsal lithotomy position.  Once alert and extubated, transferred to the recovery room in good condition.  Sponge, instrument, and needle count was reported as correct by circulating nurse.     Darlyn Chamber, M.D.     JSM/MEDQ  D:  03/26/2016  T:  03/26/2016  Job:  [id number]

## 2016-03-26 NOTE — H&P (Signed)
  History and physical exam unchanged 

## 2016-03-27 ENCOUNTER — Encounter (HOSPITAL_BASED_OUTPATIENT_CLINIC_OR_DEPARTMENT_OTHER): Payer: Self-pay | Admitting: Obstetrics and Gynecology

## 2016-04-09 ENCOUNTER — Telehealth: Payer: Self-pay | Admitting: Internal Medicine

## 2016-04-09 MED ORDER — PROMETHAZINE HCL 25 MG PO TABS: 25.0000 mg | ORAL_TABLET | Freq: Four times a day (QID) | ORAL | 2 refills | Status: DC | PRN

## 2016-04-09 NOTE — Telephone Encounter (Signed)
Pt notified Rx refill for Phenergan was sent to pharmacy. Pt verbalized understanding.

## 2016-04-09 NOTE — Telephone Encounter (Signed)
Pt needs a refill on phenergan 25 mg send to [facility]. Pt is aware md out of office

## 2016-04-17 ENCOUNTER — Encounter: Payer: BLUE CROSS/BLUE SHIELD | Admitting: Internal Medicine

## 2016-04-17 NOTE — H&P (Signed)
  Patient name  delima, silveria DICTATION# [id number] CSN# [id number]  [facility], MD 04/17/2016 9:11 AM

## 2016-04-20 NOTE — H&P (Unsigned)
NAMELEMIA, Rhonda [name] NO.:  [id number]  MEDICAL RECORD NO.:  [id number]  LOCATION:                                 FACILITY:  PHYSICIAN:  Darlyn Chamber, M.D.   DATE OF BIRTH:  February 17, 1971  DATE OF ADMISSION: DATE OF DISCHARGE:                             HISTORY & PHYSICAL   DATE OF HER SURGERY:  April 28, 2016, at [facility] here in [place].  HISTORY OF PRESENT ILLNESS:  The patient is a 45 year old, gravida 3, para 2 female who presents for exploratory laparotomy with bilateral salpingo-oophorectomy.  The patient has had a previous total abdominal hysterectomy for pain in the past.  She was having trouble worsening pelvic pain discomfort.  Her ultrasound was unremarkable.  GI workup was also unremarkable.  She underwent laparoscopic evaluation, had dense adhesions of both ovaries to the vaginal cuff and colon and now presents for definitive surgery.  ALLERGIES:  She is allergic to ASPIRIN.  MEDICATIONS:  She is on: 1. Diovan 160 mg. 2. Metoprolol 50 mg. 3. Amlodipine 5 mg. 4. Synthroid 150 mcg. 5. Omeprazole 40 mg. 6. Claritin as needed.  PAST MEDICAL HISTORY: 1. She has a history of hypertension. 2. She has a history of chronic nephritis. 3. She has some decreasing kidney function and does have proteinuria. 4. She has a history of hypothyroidism, previous thyroidectomy in     1991.  PAST SURGICAL HISTORY: 1. She has had her gallbladder removed in 1997. 2. Tubes tied in 1992. 3. She had a hysterectomy in 1998. 4. Had thyroidectomy in 1991. 5. Had a previous laparoscopy as noted.  SOCIAL HISTORY:  Reveals no tobacco or alcohol use.  FAMILY HISTORY:  Noncontributory.  REVIEW OF SYSTEMS:  Noncontributory.  PHYSICAL EXAMINATION:  GENERAL:  The patient is afebrile with stable vital signs. HEENT:  The patient is normocephalic.  Pupils equal, round, reactive to light and accommodation.  Extraocular movements were intact.   Sclerae and conjunctivae clear.  Oropharynx clear. NECK:  Without thyromegaly. LUNGS:  Clear. CARDIOVASCULAR:  Regular rhythm and rate without murmurs or gallops.  No carotid or abdominal bruits. ABDOMEN:  Benign.  No mass, organomegaly, or tenderness. PELVIC:  Normal external genitalia.  Vaginal mucosa is clear.  Cuff intact.  Bimanual is unremarkable. EXTREMITIES:  Trace edema. NEUROLOGIC:  Grossly within normal limits.  IMPRESSION: 1. Pelvic pain with dense adhesions of the ovaries. 2. Hypertension. 3. History of chronic nephritis.  PLAN:  At the present time, we are going to proceed with exploratory laparotomy with bilateral salpingo-oophorectomy.  Risks of surgery have been explained including the risk of infection; the risk of hemorrhage that could require transfusion with the risk of AIDS or hepatitis; risk of injury to adjacent organs including bladder, bowel, ureters that could require further exploratory surgery; risk of deep venous thrombosis and pulmonary embolus.  The patient does express understanding of the indications, risks, and alternatives.     Darlyn Chamber, M.D.   ______________________________ Darlyn Chamber, M.D.    JSM/MEDQ  D:  04/17/2016  T:  04/18/2016  Job:  [id number]

## 2016-04-22 NOTE — Patient Instructions (Addendum)
Your procedure is scheduled on:  Tuesday, Sept. 12, 2017  Enter through the [employer] [facility] at:  6:00 AM  Pick up the phone at the desk and dial [phone].  Call this number if you have problems the morning of surgery: [phone].  Remember: Do NOT eat food or drink after:  Midnight Monday, Sept. 11, 2017  Take these medicines the morning of surgery with a SIP OF WATER:  Amlodipine, Levothyroxine, Omeprazole, Valsartan, Xanax if needed  Bring Asthma Inhaler day of surgery  Do NOT wear jewelry (body piercing), metal hair clips/bobby pins, make-up, or nail polish. Do NOT wear lotions, powders, or perfumes.  You may wear deodorant. Do NOT shave for 48 hours prior to surgery. Do NOT bring valuables to the hospital. Contacts, dentures, or bridgework may not be worn into surgery.  Leave suitcase in car.  After surgery it may be brought to your room.  For patients admitted to the hospital, checkout time is 11:00 AM the day of discharge.

## 2016-04-23 ENCOUNTER — Encounter (HOSPITAL_COMMUNITY): Payer: Self-pay

## 2016-04-23 ENCOUNTER — Encounter (HOSPITAL_COMMUNITY)
Admission: RE | Admit: 2016-04-23 | Discharge: 2016-04-23 | Disposition: A | Discharge status: Home / Self Care | Payer: 59 | Source: Ambulatory Visit | Attending: Obstetrics and Gynecology | Admitting: Obstetrics and Gynecology

## 2016-04-23 DIAGNOSIS — I10 Essential (primary) hypertension: Secondary | ICD-10-CM | POA: Diagnosis not present

## 2016-04-23 DIAGNOSIS — Z01812 Encounter for preprocedural laboratory examination: Secondary | ICD-10-CM | POA: Insufficient documentation

## 2016-04-23 DIAGNOSIS — R102 Pelvic and perineal pain: Secondary | ICD-10-CM | POA: Diagnosis not present

## 2016-04-23 HISTORY — DX: Malignant (primary) neoplasm, unspecified: C80.1

## 2016-04-23 LAB — BASIC METABOLIC PANEL
ANION GAP: 5 (ref 5–15)
BUN: 13 mg/dL (ref 6–20)
CALCIUM: 9.6 mg/dL (ref 8.9–10.3)
CO2: 27 mmol/L (ref 22–32)
CREATININE: 1.13 mg/dL — AB (ref 0.44–1.00)
Chloride: 106 mmol/L (ref 101–111)
GFR calc Af Amer: >60 mL/min (ref >60)
GFR calc non Af Amer: 57 mL/min — ABNORMAL LOW (ref >60)
GLUCOSE: 78 mg/dL (ref 65–99)
POTASSIUM: 3.9 mmol/L (ref 3.5–5.1)
Sodium: 138 mmol/L (ref 135–145)

## 2016-04-23 LAB — CBC
HEMATOCRIT: 38.5 % (ref 36.0–46.0)
Hemoglobin: 13 g/dL (ref 12.0–15.0)
MCH: 28.4 pg (ref 26.0–34.0)
MCHC: 33.8 g/dL (ref 30.0–36.0)
MCV: 84.2 fL (ref 78.0–100.0)
PLATELETS: 316 10*3/uL (ref 150–400)
RBC: 4.57 MIL/uL (ref 3.87–5.11)
RDW: 13.2 % (ref 11.5–15.5)
WBC: 6.7 10*3/uL (ref 4.0–10.5)

## 2016-04-24 ENCOUNTER — Encounter: Payer: BLUE CROSS/BLUE SHIELD | Admitting: Internal Medicine

## 2016-04-28 ENCOUNTER — Inpatient Hospital Stay (HOSPITAL_COMMUNITY)
Admission: RE | Admit: 2016-04-28 | Discharge: 2016-04-30 | DRG: 743 | Disposition: A | Discharge status: Home / Self Care | Payer: 59 | Source: Ambulatory Visit | Attending: Obstetrics and Gynecology | Admitting: Obstetrics and Gynecology

## 2016-04-28 ENCOUNTER — Encounter (HOSPITAL_COMMUNITY): Payer: Self-pay

## 2016-04-28 ENCOUNTER — Inpatient Hospital Stay (HOSPITAL_COMMUNITY): Payer: 59 | Admitting: Anesthesiology

## 2016-04-28 ENCOUNTER — Encounter (HOSPITAL_COMMUNITY)
Admission: RE | Disposition: A | Discharge status: Home / Self Care | Payer: Self-pay | Source: Ambulatory Visit | Attending: Obstetrics and Gynecology

## 2016-04-28 DIAGNOSIS — Z90722 Acquired absence of ovaries, bilateral: Secondary | ICD-10-CM

## 2016-04-28 DIAGNOSIS — I10 Essential (primary) hypertension: Secondary | ICD-10-CM | POA: Diagnosis present

## 2016-04-28 DIAGNOSIS — E039 Hypothyroidism, unspecified: Secondary | ICD-10-CM | POA: Diagnosis present

## 2016-04-28 DIAGNOSIS — K219 Gastro-esophageal reflux disease without esophagitis: Secondary | ICD-10-CM | POA: Diagnosis present

## 2016-04-28 DIAGNOSIS — N736 Female pelvic peritoneal adhesions (postinfective): Principal | ICD-10-CM | POA: Diagnosis present

## 2016-04-28 DIAGNOSIS — R102 Pelvic and perineal pain: Secondary | ICD-10-CM | POA: Diagnosis present

## 2016-04-28 DIAGNOSIS — R809 Proteinuria, unspecified: Secondary | ICD-10-CM | POA: Diagnosis present

## 2016-04-28 DIAGNOSIS — Z90710 Acquired absence of both cervix and uterus: Secondary | ICD-10-CM

## 2016-04-28 HISTORY — PX: CYSTOSCOPY: SHX5120

## 2016-04-28 HISTORY — PX: LAPAROTOMY: SHX154

## 2016-04-28 HISTORY — PX: SALPINGOOPHORECTOMY: SHX82

## 2016-04-28 LAB — TYPE AND SCREEN
ABO/RH(D): A POS
ANTIBODY SCREEN: NEGATIVE

## 2016-04-28 LAB — ABO/RH: ABO/RH(D): A POS

## 2016-04-28 SURGERY — LAPAROTOMY, EXPLORATORY
Anesthesia: General | Site: Bladder

## 2016-04-28 MED ORDER — LACTATED RINGERS IV SOLN
INTRAVENOUS | Status: DC
  Administered 2016-04-28 (×3): via INTRAVENOUS

## 2016-04-28 MED ORDER — FENTANYL CITRATE (PF) 100 MCG/2ML IJ SOLN
25.0000 ug | INTRAMUSCULAR | Status: DC | PRN
  Administered 2016-04-28 (×2): 50 ug via INTRAVENOUS
  Administered 2016-04-28 (×2): 25 ug via INTRAVENOUS

## 2016-04-28 MED ORDER — FENTANYL CITRATE (PF) 100 MCG/2ML IJ SOLN
50.0000 ug | Freq: Once | INTRAMUSCULAR | Status: AC
  Administered 2016-04-28: 50 ug via INTRAVENOUS

## 2016-04-28 MED ORDER — FENTANYL CITRATE (PF) 100 MCG/2ML IJ SOLN
INTRAMUSCULAR | Status: DC | PRN
  Administered 2016-04-28: 50 ug via INTRAVENOUS
  Administered 2016-04-28: 100 ug via INTRAVENOUS
  Administered 2016-04-28: 50 ug via INTRAVENOUS
  Administered 2016-04-28 (×2): 100 ug via INTRAVENOUS
  Administered 2016-04-28 (×2): 50 ug via INTRAVENOUS

## 2016-04-28 MED ORDER — ROCURONIUM BROMIDE 100 MG/10ML IV SOLN
INTRAVENOUS | Status: DC | PRN
  Administered 2016-04-28: 10 mg via INTRAVENOUS
  Administered 2016-04-28: 40 mg via INTRAVENOUS

## 2016-04-28 MED ORDER — DIPHENHYDRAMINE HCL 12.5 MG/5ML PO ELIX: 12.5000 mg | ORAL_SOLUTION | Freq: Four times a day (QID) | ORAL | Status: DC | PRN

## 2016-04-28 MED ORDER — FENTANYL CITRATE (PF) 250 MCG/5ML IJ SOLN
INTRAMUSCULAR | Status: AC
  Filled 2016-04-28: qty 5

## 2016-04-28 MED ORDER — LACTATED RINGERS IV SOLN
INTRAVENOUS | Status: DC
  Administered 2016-04-28 (×2): via INTRAVENOUS

## 2016-04-28 MED ORDER — LIDOCAINE HCL (CARDIAC) 20 MG/ML IV SOLN
INTRAVENOUS | Status: AC
  Filled 2016-04-28: qty 5

## 2016-04-28 MED ORDER — SCOPOLAMINE 1 MG/3DAYS TD PT72
1.0000 | MEDICATED_PATCH | Freq: Once | TRANSDERMAL | Status: DC
  Administered 2016-04-28: 1.5 mg via TRANSDERMAL

## 2016-04-28 MED ORDER — LEVOTHYROXINE SODIUM 150 MCG PO TABS
150.0000 ug | ORAL_TABLET | Freq: Every day | ORAL | Status: DC
  Administered 2016-04-29 – 2016-04-30 (×2): 150 ug via ORAL
  Filled 2016-04-28 (×2): qty 1

## 2016-04-28 MED ORDER — FLUORESCEIN SODIUM 10 % IV SOLN
INTRAVENOUS | Status: DC | PRN
  Administered 2016-04-28: .25 mL via INTRAVENOUS

## 2016-04-28 MED ORDER — ONDANSETRON HCL 4 MG/2ML IJ SOLN: 4.0000 mg | Freq: Once | INTRAMUSCULAR | Status: DC | PRN

## 2016-04-28 MED ORDER — MIDAZOLAM HCL 2 MG/2ML IJ SOLN
INTRAMUSCULAR | Status: DC | PRN
  Administered 2016-04-28: 2 mg via INTRAVENOUS

## 2016-04-28 MED ORDER — ONDANSETRON HCL 4 MG/2ML IJ SOLN
INTRAMUSCULAR | Status: DC | PRN
  Administered 2016-04-28: 4 mg via INTRAVENOUS

## 2016-04-28 MED ORDER — CEFAZOLIN SODIUM-DEXTROSE 2-4 GM/100ML-% IV SOLN
2.0000 g | INTRAVENOUS | Status: AC
  Administered 2016-04-28: 2 g via INTRAVENOUS

## 2016-04-28 MED ORDER — FENTANYL CITRATE (PF) 100 MCG/2ML IJ SOLN
INTRAMUSCULAR | Status: AC
  Filled 2016-04-28: qty 2

## 2016-04-28 MED ORDER — ROCURONIUM BROMIDE 100 MG/10ML IV SOLN
INTRAVENOUS | Status: AC
  Filled 2016-04-28: qty 1

## 2016-04-28 MED ORDER — ACETAMINOPHEN 10 MG/ML IV SOLN
1000.0000 mg | Freq: Once | INTRAVENOUS | Status: AC
  Administered 2016-04-28: 1000 mg via INTRAVENOUS
  Filled 2016-04-28: qty 100

## 2016-04-28 MED ORDER — HEPARIN SODIUM (PORCINE) 5000 UNIT/ML IJ SOLN
5000.0000 [IU] | INTRAMUSCULAR | Status: AC
  Administered 2016-04-28: 5000 [IU] via SUBCUTANEOUS

## 2016-04-28 MED ORDER — LIDOCAINE HCL (CARDIAC) 20 MG/ML IV SOLN
INTRAVENOUS | Status: DC | PRN
  Administered 2016-04-28: 80 mg via INTRAVENOUS

## 2016-04-28 MED ORDER — MIDAZOLAM HCL 2 MG/2ML IJ SOLN
INTRAMUSCULAR | Status: AC
  Filled 2016-04-28: qty 2

## 2016-04-28 MED ORDER — PHENYLEPHRINE HCL 10 MG/ML IJ SOLN
INTRAMUSCULAR | Status: DC | PRN
  Administered 2016-04-28 (×2): 80 ug via INTRAVENOUS

## 2016-04-28 MED ORDER — SUGAMMADEX SODIUM 200 MG/2ML IV SOLN
INTRAVENOUS | Status: DC | PRN
  Administered 2016-04-28: 200 mg via INTRAVENOUS

## 2016-04-28 MED ORDER — ONDANSETRON HCL 4 MG PO TABS: 4.0000 mg | ORAL_TABLET | Freq: Four times a day (QID) | ORAL | Status: DC | PRN

## 2016-04-28 MED ORDER — DIPHENHYDRAMINE HCL 50 MG/ML IJ SOLN: 12.5000 mg | Freq: Four times a day (QID) | INTRAMUSCULAR | Status: DC | PRN

## 2016-04-28 MED ORDER — SODIUM CHLORIDE 0.9% FLUSH: 9.0000 mL | INTRAVENOUS | Status: DC | PRN

## 2016-04-28 MED ORDER — DEXAMETHASONE SODIUM PHOSPHATE 10 MG/ML IJ SOLN
INTRAMUSCULAR | Status: AC
  Filled 2016-04-28: qty 1

## 2016-04-28 MED ORDER — PROPOFOL 10 MG/ML IV BOLUS
INTRAVENOUS | Status: AC
  Filled 2016-04-28: qty 20

## 2016-04-28 MED ORDER — ENOXAPARIN SODIUM 40 MG/0.4ML ~~LOC~~ SOLN
40.0000 mg | SUBCUTANEOUS | Status: DC
  Administered 2016-04-28 – 2016-04-29 (×2): 40 mg via SUBCUTANEOUS
  Filled 2016-04-28 (×2): qty 0.4

## 2016-04-28 MED ORDER — SUGAMMADEX SODIUM 200 MG/2ML IV SOLN
INTRAVENOUS | Status: AC
  Filled 2016-04-28: qty 2

## 2016-04-28 MED ORDER — AMLODIPINE BESYLATE 5 MG PO TABS
5.0000 mg | ORAL_TABLET | Freq: Every day | ORAL | Status: DC
  Administered 2016-04-29: 5 mg via ORAL
  Filled 2016-04-28 (×2): qty 1

## 2016-04-28 MED ORDER — ALBUTEROL SULFATE (2.5 MG/3ML) 0.083% IN NEBU: 3.0000 mL | INHALATION_SOLUTION | Freq: Four times a day (QID) | RESPIRATORY_TRACT | Status: DC | PRN

## 2016-04-28 MED ORDER — METOPROLOL SUCCINATE 12.5 MG HALF TABLET
12.5000 mg | ORAL_TABLET | Freq: Every day | ORAL | Status: DC
  Administered 2016-04-29: 12.5 mg via ORAL
  Filled 2016-04-28 (×2): qty 1

## 2016-04-28 MED ORDER — IRBESARTAN 300 MG PO TABS
300.0000 mg | ORAL_TABLET | Freq: Every day | ORAL | Status: DC
  Administered 2016-04-29: 300 mg via ORAL
  Filled 2016-04-28 (×2): qty 1

## 2016-04-28 MED ORDER — CEFAZOLIN SODIUM-DEXTROSE 2-4 GM/100ML-% IV SOLN: 2.0000 g | INTRAVENOUS | Status: DC

## 2016-04-28 MED ORDER — HEPARIN SODIUM (PORCINE) 5000 UNIT/ML IJ SOLN: 5000.0000 [IU] | Freq: Two times a day (BID) | INTRAMUSCULAR | Status: DC

## 2016-04-28 MED ORDER — ONDANSETRON HCL 4 MG/2ML IJ SOLN: 4.0000 mg | Freq: Four times a day (QID) | INTRAMUSCULAR | Status: DC | PRN

## 2016-04-28 MED ORDER — FENTANYL 40 MCG/ML IV SOLN
INTRAVENOUS | Status: DC
  Administered 2016-04-28: 12:00:00 via INTRAVENOUS
  Administered 2016-04-28: 90 ug via INTRAVENOUS
  Administered 2016-04-28: 20 ug via INTRAVENOUS
  Administered 2016-04-29: 10 ug via INTRAVENOUS
  Filled 2016-04-28: qty 25

## 2016-04-28 MED ORDER — ACETAMINOPHEN 325 MG PO TABS: 650.0000 mg | ORAL_TABLET | ORAL | Status: DC | PRN

## 2016-04-28 MED ORDER — DEXAMETHASONE SODIUM PHOSPHATE 10 MG/ML IJ SOLN
INTRAMUSCULAR | Status: DC | PRN
Start: 2016-04-28 — End: 2016-04-28
  Administered 2016-04-28: 10 mg via INTRAVENOUS

## 2016-04-28 MED ORDER — PROPOFOL 10 MG/ML IV BOLUS
INTRAVENOUS | Status: DC | PRN
  Administered 2016-04-28: 180 mg via INTRAVENOUS

## 2016-04-28 MED ORDER — ONDANSETRON HCL 4 MG/2ML IJ SOLN
INTRAMUSCULAR | Status: AC
  Filled 2016-04-28: qty 2

## 2016-04-28 MED ORDER — HEPARIN SODIUM (PORCINE) 5000 UNIT/ML IJ SOLN
INTRAMUSCULAR | Status: AC
  Administered 2016-04-28: 5000 [IU] via SUBCUTANEOUS
  Filled 2016-04-28: qty 1

## 2016-04-28 MED ORDER — NALOXONE HCL 0.4 MG/ML IJ SOLN: 0.4000 mg | INTRAMUSCULAR | Status: DC | PRN

## 2016-04-28 MED ORDER — SCOPOLAMINE 1 MG/3DAYS TD PT72
MEDICATED_PATCH | TRANSDERMAL | Status: DC
Start: 2016-04-28 — End: 2016-04-30
  Administered 2016-04-28: 1.5 mg via TRANSDERMAL
  Filled 2016-04-28: qty 1

## 2016-04-28 MED ORDER — PHENYLEPHRINE 40 MCG/ML (10ML) SYRINGE FOR IV PUSH (FOR BLOOD PRESSURE SUPPORT)
PREFILLED_SYRINGE | INTRAVENOUS | Status: AC
  Filled 2016-04-28: qty 10

## 2016-04-28 MED ORDER — MENTHOL 3 MG MT LOZG: 1.0000 | LOZENGE | OROMUCOSAL | Status: DC | PRN

## 2016-04-28 MED ORDER — FLUORESCEIN SODIUM 10 % IV SOLN
INTRAVENOUS | Status: AC
  Filled 2016-04-28: qty 5

## 2016-04-28 SURGICAL SUPPLY — 41 items
BLADE SURG 10 STRL SS (BLADE) ×8 IMPLANT
CANISTER SUCT 3000ML (MISCELLANEOUS) ×4 IMPLANT
CLOTH BEACON ORANGE TIMEOUT ST (SAFETY) ×4 IMPLANT
DECANTER SPIKE VIAL GLASS SM (MISCELLANEOUS) IMPLANT
DRAPE WARM FLUID 44X44 (DRAPE) ×1 IMPLANT
DRSG OPSITE POSTOP 4X10 (GAUZE/BANDAGES/DRESSINGS) ×1 IMPLANT
DURAPREP 26ML APPLICATOR (WOUND CARE) ×4 IMPLANT
ELECT BLADE 6 FLAT ULTRCLN (ELECTRODE) ×1 IMPLANT
GAUZE SPONGE 4X4 16PLY XRAY LF (GAUZE/BANDAGES/DRESSINGS) ×1 IMPLANT
GLOVE BIO SURGEON STRL SZ7 (GLOVE) ×4 IMPLANT
GLOVE BIOGEL PI IND STRL 7.0 (GLOVE) ×3 IMPLANT
GLOVE BIOGEL PI INDICATOR 7.0 (GLOVE) ×1
GOWN STRL REUS W/TWL LRG LVL3 (GOWN DISPOSABLE) ×8 IMPLANT
HEMOSTAT ARISTA ABSORB 3G PWDR (MISCELLANEOUS) ×1 IMPLANT
IV STOPCOCK 4 WAY 40  W/Y SET (IV SOLUTION)
IV STOPCOCK 4 WAY 40 W/Y SET (IV SOLUTION) IMPLANT
NEEDLE HYPO 22GX1.5 SAFETY (NEEDLE) IMPLANT
NS IRRIG 1000ML POUR BTL (IV SOLUTION) ×5 IMPLANT
PACK ABDOMINAL GYN (CUSTOM PROCEDURE TRAY) ×4 IMPLANT
PAD OB MATERNITY 4.3X12.25 (PERSONAL CARE ITEMS) ×4 IMPLANT
PENCIL SMOKE EVAC W/HOLSTER (ELECTROSURGICAL) ×4 IMPLANT
PROTECTOR NERVE ULNAR (MISCELLANEOUS) ×3 IMPLANT
SPONGE LAP 18X18 X RAY DECT (DISPOSABLE) ×8 IMPLANT
STAPLER VISISTAT 35W (STAPLE) ×4 IMPLANT
SUT CHROMIC 0 CT 1 (SUTURE) ×1 IMPLANT
SUT MON AB 4-0 PS1 27 (SUTURE) ×1 IMPLANT
SUT PDS AB 0 CTX 60 (SUTURE) ×1 IMPLANT
SUT PDS AB 1 CT  36 (SUTURE) ×1
SUT PDS AB 1 CT 36 (SUTURE) ×6 IMPLANT
SUT PDS AB 5-0 P3 18 (SUTURE) IMPLANT
SUT PLAIN 2 0 XLH (SUTURE) ×1 IMPLANT
SUT VIC AB 3-0 CT1 27 (SUTURE) ×4
SUT VIC AB 3-0 CT1 TAPERPNT 27 (SUTURE) ×3 IMPLANT
SUT VIC AB 3-0 PS1 18 (SUTURE) ×4
SUT VIC AB 3-0 PS1 18X BRD (SUTURE) IMPLANT
SUT VICRYL 0 TIES 12 18 (SUTURE) IMPLANT
SYR 50ML LL SCALE MARK (SYRINGE) IMPLANT
SYR CONTROL 10ML LL (SYRINGE) ×1 IMPLANT
TOWEL OR 17X24 6PK STRL BLUE (TOWEL DISPOSABLE) ×8 IMPLANT
TRAY FOLEY CATH SILVER 14FR (SET/KITS/TRAYS/PACK) ×4 IMPLANT
WATER STERILE IRR 1000ML POUR (IV SOLUTION) ×3 IMPLANT

## 2016-04-28 NOTE — Anesthesia Procedure Notes (Signed)
Procedure Name: Intubation Date/Time: 04/28/2016 7:34 AM Performed by: Casimer Lanius A Pre-anesthesia Checklist: Patient identified, Emergency Drugs available, Suction available and Patient being monitored Patient Re-evaluated:Patient Re-evaluated prior to inductionOxygen Delivery Method: Circle system utilized and Simple face mask Preoxygenation: Pre-oxygenation with 100% oxygen Intubation Type: Combination inhalational/ intravenous induction and Cricoid Pressure applied Ventilation: Mask ventilation without difficulty Laryngoscope Size: Mac and 3 Grade View: Grade III Tube type: Oral Tube size: 7.0 mm Number of attempts: 2 (LB 1st attempt MAC 3 Grade 3-4, MG 2nd attempt bougie) Airway Equipment and Method: Stylet,  Patient positioned with wedge pillow,  Bougie stylet and Rigid stylet Placement Confirmation: positive ETCO2 and breath sounds checked- equal and bilateral Secured at: 21 (right lip) cm Tube secured with: Tape Dental Injury: Teeth and Oropharynx as per pre-operative assessment  Difficulty Due To: Difficult Airway- due to anterior larynx Future Recommendations: Recommend- induction with short-acting agent, and alternative techniques readily available

## 2016-04-28 NOTE — Anesthesia Preprocedure Evaluation (Signed)
Anesthesia Evaluation    Airway Mallampati: II  TM Distance: >3 FB Neck ROM: Full    Dental no notable dental hx.    Pulmonary    Pulmonary exam normal breath sounds clear to auscultation       Cardiovascular hypertension, Normal cardiovascular exam Rhythm:Regular Rate:Normal     Neuro/Psych    GI/Hepatic GERD  ,  Endo/Other  Hypothyroidism   Renal/GU      Musculoskeletal   Abdominal   Peds  Hematology   Anesthesia Other Findings   Reproductive/Obstetrics                             Anesthesia Physical Anesthesia Plan  ASA: II  Anesthesia Plan: General   Post-op Pain Management:    Induction: Intravenous  Airway Management Planned: Oral ETT  Additional Equipment:   Intra-op Plan:   Post-operative Plan: Extubation in OR  Informed Consent: I have reviewed the patients History and Physical, chart, labs and discussed the procedure including the risks, benefits and alternatives for the proposed anesthesia with the patient or authorized representative who has indicated his/her understanding and acceptance.   Dental advisory given  Plan Discussed with: CRNA  Anesthesia Plan Comments:         Anesthesia Quick Evaluation

## 2016-04-28 NOTE — Brief Op Note (Signed)
04/28/2016  9:04 AM  PATIENT:  Rhonda [name]  45 y.o. female  PRE-OPERATIVE DIAGNOSIS:  pelvic adhesions, pain  POST-OPERATIVE DIAGNOSIS:  pelvic adhesions, pain  PROCEDURE:  Procedure(s): EXPLORATORY LAPAROTOMY (N/A) SALPINGO OOPHORECTOMY (Bilateral) CYSTOSCOPY (N/A)  SURGEON:  Surgeon(s) and Role:    * Arvella Nigh, MD - Primary  PHYSICIAN ASSISTANT:   ASSISTANTS: tomblin   ANESTHESIA:   general  EBL:  Total I/O In: 2000 [I.V.:2000] Out: 375 [Urine:275; Blood:100]  BLOOD ADMINISTERED:none  DRAINS: Urinary Catheter (Foley)   LOCAL MEDICATIONS USED:  NONE  SPECIMEN:  Source of Specimen:  bilateral ovaries  DISPOSITION OF SPECIMEN:  PATHOLOGY  COUNTS:  YES  TOURNIQUET:  * No tourniquets in log *  DICTATION: .Other Dictation: Dictation Number [id number]  PLAN OF CARE: Admit for overnight observation  PATIENT DISPOSITION:  PACU - hemodynamically stable.   Delay start of Pharmacological VTE agent (>24hrs) due to surgical blood loss or risk of bleeding: no

## 2016-04-28 NOTE — Transfer of Care (Signed)
Immediate Anesthesia Transfer of Care Note  Patient: Rhonda [name]  Procedure(s) Performed: Procedure(s): EXPLORATORY LAPAROTOMY (N/A) SALPINGO OOPHORECTOMY (Bilateral) CYSTOSCOPY (N/A)  Patient Location: PACU  Anesthesia Type:General  Level of Consciousness: sedated  Airway & Oxygen Therapy: Patient Spontanous Breathing and Patient connected to nasal cannula oxygen  Post-op Assessment: Report given to RN and Post -op Vital signs reviewed and stable  Post vital signs: Reviewed and stable  Last Vitals:  Vitals:   04/28/16 0615  BP: (!) 146/100  Pulse: 74  Resp: 18  Temp: 36.9 C    Last Pain:  Vitals:   04/28/16 0615  TempSrc: Oral      Patients Stated Pain Goal: 3 ([id number])  Complications: No apparent anesthesia complications

## 2016-04-28 NOTE — Addendum Note (Signed)
Addendum  created 04/28/16 1403 by Jonna Munro, CRNA   Sign clinical note

## 2016-04-28 NOTE — Op Note (Signed)
NAMEAMAN, NOURSE NO.:  [id number]  MEDICAL RECORD NO.:  [id number]  LOCATION:  [facility]                          FACILITY:  [facility]  PHYSICIAN:  Darlyn Chamber, M.D.   DATE OF BIRTH:  06-03-1971  DATE OF PROCEDURE:  04/28/2016 DATE OF DISCHARGE:                              OPERATIVE REPORT   PREOPERATIVE DIAGNOSIS:  Adhesions of both ovaries together at the top of the vaginal cuff with associated pelvic pain.  POSTOPERATIVE DIAGNOSIS: Adhesions of both ovaries together at the top of the vaginal cuff with associated pelvic pain.  OPERATIVE PROCEDURE:  Exploratory laparotomy with bilateral salpingo- oophorectomy and cystoscopy.  ASSISTANT:  Tomblin.  ANESTHESIA:  General endotracheal.  ESTIMATED BLOOD LOSS:  250 mL.  PACKS:  None.  DRAINS:  Included urethral Foley.  INTRAOPERATIVE BLOOD PLACED:  None.  COMPLICATIONS:  None.  INDICATIONS:  Are as dictated in the history and physical.  PROCEDURE IN DETAIL:  The patient was taken to the OR and placed in supine position.  She was subsequently placed in a dorsal supine position.  After satisfactory level of general endotracheal anesthesia obtained, the peritoneum and vagina were prepped out with Betadine. Foley was placed to straight drain.  Sponge on a sponge stick was placed in the vagina.  The abdomen was prepped with DuraPrep.  After a period of time, the patient then draped in sterile field.  Low transverse incision was made with knife carried through subcutaneous tissue. Anterior rectus fascia was entered sharply.  Incision was fashioned laterally.  Fascia taken off the muscle superiorly and inferiorly. Rectus muscles were separated in the midline.  Peritoneum was entered sharply.  Incision of peritoneum extended both superiorly and inferiorly.  There was no evidence of any pelvic adhesions or other problems.  Palpation of the upper abdomen revealed delivered to be smooth.  Both lateral gutters  were cleared.  Ovaries were densely adherent to the top of the vaginal cuff.  A self-retaining retractor was put in place and bowel contents were packed superiorly.  The ovaries were again densely adhered together at the top of the vaginal cuff.  We listed both ovaries with Babcock's.  We first developed the bladder flap off the vagina.  Again, has sponge on a sponge stick.  Identified the top of the vagina.  We then went posteriorly using sharp dissection and some blunt dissection, the bowel was taken off both the ovaries as well as the posterior vaginal cuff.  At this point in time, we went to the right ovary, we Identified the ovarian vasculature.  We entered the peritoneum over the right gutter, developed the retroperitoneal space, identified the ureter.  Isolated the ovarian vasculature above the ureter, clamped, cut, and doubly ligated with a free tie of 0 Vicryl, then the suture ligature of 0 Vicryl.  We then went to the left side.  After some dissection, we entered the left retroperitoneal space.  We identified the ureter which simply down the pelvis, isolated the ovarian vasculature above.  The ureter clamped, cut, and doubly ligated with free tie of 0 Vicryl suture ligature of 0 Vicryl.  We further dissected the bladder and made sure the bowel was well  away.  We then clamped the attachments of the ovary to the peritoneum at the top of the vaginal cuff.  These were cut and ligated with suture ligature of 0 Vicryl.  The ovaries were then was completely excised at this point.  We had some oozing from where we had taken the bladder and this is a brought under control with cautery.  At this point in time, we had good hemostasis.  No evidence of injury to the bowel and urine output was clear and adequate.  The top of the vagina had not been entered.  There may have been some slight portions of the ovary left on the colon.  We left that in place.  We thoroughly irrigated the  pelvis.  We had good hemostasis.  We then used rift up in the pelvis to obtain good hemostasis.  Packs were removed along with the self-retaining retractor.  Peritoneum was closed with running suture of 0 Vicryl.  Fascia was closed with a double-looped suture of 0 PDS. Subcu was closed with a running suture of 0 plain catgut.  Skin was closed with a running subcuticular 4-0 Monocryl and Dermabond.  At this point in time, sponge, instrument, and needle count reported as correct by circulating nurse x2.  Sponge on sponge stick was removed from vagina.  Foley was removed.  Cystoscopy was performed.  Patient will be given fluorecin.  The bladder showed no injuries and green- tinged urine was noted to coming from both ureteral orifices documenting no ureteral injury.  Cystoscope was removed.  Foley was placed back to straight drain.  The patient taken out of the dorsal lithotomy position. Once alert and extubated, transferred to recovery room in good condition.  Again, sponge, instrument, and needle counts were reported as correct by circulating nurse x2.     Darlyn Chamber, M.D.     JSM/MEDQ  D:  04/28/2016  T:  04/28/2016  Job:  [id number]

## 2016-04-28 NOTE — H&P (Signed)
  History and physical exam unchanged 

## 2016-04-28 NOTE — Progress Notes (Signed)
Patient ID: Rhonda [name], female   DOB: April 15, 1971, 45 y.o.   MRN: [id number] AF [employer] CLEAR GOOD UO COULD D/C IN AM

## 2016-04-28 NOTE — Anesthesia Postprocedure Evaluation (Signed)
Anesthesia Post Note  Patient: Rhonda [name]  Procedure(s) Performed: Procedure(s) (LRB): EXPLORATORY LAPAROTOMY (N/A) SALPINGO OOPHORECTOMY (Bilateral) CYSTOSCOPY (N/A)  Patient location during evaluation: PACU Anesthesia Type: General Level of consciousness: awake and alert Pain management: pain level controlled Vital Signs Assessment: post-procedure vital signs reviewed and stable Respiratory status: spontaneous breathing, nonlabored ventilation, respiratory function stable and patient connected to nasal cannula oxygen Cardiovascular status: blood pressure returned to baseline and stable Postop Assessment: no signs of nausea or vomiting Anesthetic complications: no    Last Vitals:  Vitals:   04/28/16 0924 04/28/16 0945  BP:  (!) 145/87  Pulse: 65 70  Resp: 19 16  Temp:      Last Pain:  Vitals:   04/28/16 0945  TempSrc:   PainSc: 7                  Jeremiah Curci S

## 2016-04-28 NOTE — Anesthesia Postprocedure Evaluation (Signed)
Anesthesia Post Note  Patient: Rhonda [name]  Procedure(s) Performed: Procedure(s) (LRB): EXPLORATORY LAPAROTOMY (N/A) Bilateral OOPHORECTOMY (Bilateral) CYSTOSCOPY (N/A)  Patient location during evaluation: [facility] Anesthesia Type: General Level of consciousness: awake and alert and oriented Pain management: pain level controlled Vital Signs Assessment: post-procedure vital signs reviewed and stable Respiratory status: spontaneous breathing, respiratory function stable, nonlabored ventilation and patient connected to nasal cannula oxygen Cardiovascular status: stable Postop Assessment: no signs of nausea or vomiting and adequate PO intake Anesthetic complications: no     Last Vitals:  Vitals:   04/28/16 1222 04/28/16 1329  BP: 137/86 128/86  Pulse:    Resp: 17 14  Temp: 36.9 C     Last Pain:  Vitals:   04/28/16 1400  TempSrc:   PainSc: 5    Pain Goal: Patients Stated Pain Goal: 3 (04/28/16 1400)               Willa Rough

## 2016-04-29 ENCOUNTER — Encounter (HOSPITAL_COMMUNITY): Payer: Self-pay | Admitting: *Deleted

## 2016-04-29 DIAGNOSIS — R809 Proteinuria, unspecified: Secondary | ICD-10-CM | POA: Diagnosis present

## 2016-04-29 DIAGNOSIS — R102 Pelvic and perineal pain: Secondary | ICD-10-CM | POA: Diagnosis present

## 2016-04-29 DIAGNOSIS — E039 Hypothyroidism, unspecified: Secondary | ICD-10-CM | POA: Diagnosis present

## 2016-04-29 DIAGNOSIS — I10 Essential (primary) hypertension: Secondary | ICD-10-CM | POA: Diagnosis present

## 2016-04-29 DIAGNOSIS — Z90710 Acquired absence of both cervix and uterus: Secondary | ICD-10-CM | POA: Diagnosis not present

## 2016-04-29 DIAGNOSIS — K219 Gastro-esophageal reflux disease without esophagitis: Secondary | ICD-10-CM | POA: Diagnosis present

## 2016-04-29 DIAGNOSIS — N736 Female pelvic peritoneal adhesions (postinfective): Secondary | ICD-10-CM | POA: Diagnosis present

## 2016-04-29 LAB — CBC
HEMATOCRIT: 35.6 % — AB (ref 36.0–46.0)
HEMOGLOBIN: 11.6 g/dL — AB (ref 12.0–15.0)
MCH: 27.8 pg (ref 26.0–34.0)
MCHC: 32.6 g/dL (ref 30.0–36.0)
MCV: 85.2 fL (ref 78.0–100.0)
Platelets: 307 10*3/uL (ref 150–400)
RBC: 4.18 MIL/uL (ref 3.87–5.11)
RDW: 13.6 % (ref 11.5–15.5)
WBC: 10.2 10*3/uL (ref 4.0–10.5)

## 2016-04-29 MED ORDER — PANTOPRAZOLE SODIUM 40 MG PO TBEC
40.0000 mg | DELAYED_RELEASE_TABLET | Freq: Every day | ORAL | Status: DC
  Administered 2016-04-29: 40 mg via ORAL
  Filled 2016-04-29: qty 1

## 2016-04-29 MED ORDER — OXYCODONE HCL 5 MG PO TABS
10.0000 mg | ORAL_TABLET | ORAL | Status: DC | PRN
  Administered 2016-04-29: 10 mg via ORAL
  Filled 2016-04-29: qty 2

## 2016-04-29 MED ORDER — IBUPROFEN 600 MG PO TABS: 600.0000 mg | ORAL_TABLET | Freq: Four times a day (QID) | ORAL | Status: DC | PRN

## 2016-04-29 MED ORDER — DIPHENHYDRAMINE HCL 25 MG PO CAPS
25.0000 mg | ORAL_CAPSULE | Freq: Four times a day (QID) | ORAL | Status: DC | PRN
  Administered 2016-04-29: 25 mg via ORAL
  Filled 2016-04-29: qty 1

## 2016-04-29 NOTE — Progress Notes (Addendum)
20cc of FENTYNL  pca d/c witnessed by Rica Mote RN wasted in sink

## 2016-04-29 NOTE — Progress Notes (Signed)
1 Day Post-Op Procedure(s) (LRB): EXPLORATORY LAPAROTOMY (N/A) Bilateral OOPHORECTOMY (Bilateral) CYSTOSCOPY (N/A)  Subjective: Patient reports tolerating PO.    Objective: I have reviewed patient's vital signs and labs.  General: alert GI: soft, non-tender; bowel sounds normal; no masses,  no organomegaly Vaginal Bleeding: none  Assessment: s/p Procedure(s): EXPLORATORY LAPAROTOMY (N/A) Bilateral OOPHORECTOMY (Bilateral) CYSTOSCOPY (N/A): stable  Plan: Advance diet  LOS: 1 day    Rhonda [name] S 04/29/2016, 8:36 AM

## 2016-04-30 ENCOUNTER — Encounter (HOSPITAL_COMMUNITY): Payer: Self-pay | Admitting: Obstetrics and Gynecology

## 2016-04-30 MED ORDER — OXYCODONE HCL 5 MG PO TABS: 5.0000 mg | ORAL_TABLET | ORAL | 0 refills | Status: DC | PRN

## 2016-04-30 NOTE — Discharge Summary (Signed)
Patient name Rhonda [name], heyd DICTATION# CSN# [id number]   [facility], MD 04/30/2016 8:28 AM

## 2016-04-30 NOTE — Progress Notes (Signed)
2 Days Post-Op Procedure([name]) (LRB): EXPLORATORY LAPAROTOMY (N/A) Bilateral OOPHORECTOMY (Bilateral) CYSTOSCOPY (N/A)  Subjective: Patient reports tolerating PO and no problems voiding.  Pain better  Objective: I have reviewed patient'[name] vital signs.  General: alert GI: soft, non-tender; bowel sounds normal; no masses,  no organomegaly  Assessment: [name]: EXPLORATORY LAPAROTOMY (N/A) Bilateral OOPHORECTOMY (Bilateral) CYSTOSCOPY (N/A): stable  Plan: Discharge home  LOS: 2 days    Rhonda [name] 04/30/2016, 8:24 AM

## 2016-04-30 NOTE — Progress Notes (Signed)
Pt teaching complete   Out in wheelchair  precription given by md

## 2016-05-01 NOTE — Discharge Summary (Signed)
Rhonda [name], PICHE NO.:  [id number]  MEDICAL RECORD NO.:  [id number]  LOCATION:  9303                          FACILITY:  [facility]  PHYSICIAN:  Darlyn Chamber, M.D.   DATE OF BIRTH:  02-Oct-1970  DATE OF ADMISSION:  04/28/2016 DATE OF DISCHARGE:  04/30/2016                              DISCHARGE SUMMARY   ADMITTING DIAGNOSIS:  Pelvic adhesions involving both ovaries.  DISCHARGE DIAGNOSIS:  Pelvic adhesions involving both ovaries.  PROCEDURE:  Exploratory laparotomy with bilateral oophorectomy and cystoscopy.  For complete history and physical, see dictated note.  COURSE IN HOSPITAL:  The patient underwent exploratory surgery.  We had to take down multiple adhesions.  Eventually, both ovaries were removed and intact, and sent for pathological review.  Cystoscopy was performed. Both ureters were intact.  Postop, the patient did well.  First postop day, she was having some pain and discomfort.  We had to adjust medications for that.  On 2nd postop day, she was doing much better and desired to be discharged.  She is tolerating diet.  She is voiding without difficulty.  Her bowel sounds were active.  She had no nausea. Her postop hemoglobin was 11.6. In terms of complications, none were encountered during her stay in the hospital.  The patient was discharged home in stable condition.  DISPOSITION:  The patient to avoid heavy lifting or driving a car. Instructed to call should there be fever.  Nausea, vomiting should be reported.  Excessive pain.  Signs and symptoms of deep venous thrombosis and pulmonary embolus discussed.  Discharged home on hydrocodone as needed for pain.  PLAN:  Followup in the office in 1 week.     Darlyn Chamber, M.D.     JSM/MEDQ  D:  04/30/2016  T:  05/01/2016  Job:  [id number]

## 2016-05-27 ENCOUNTER — Emergency Department (HOSPITAL_COMMUNITY)
Admission: EM | Admit: 2016-05-27 | Discharge: 2016-05-27 | Disposition: A | Discharge status: Home / Self Care | Payer: 59 | Attending: Emergency Medicine | Admitting: Emergency Medicine

## 2016-05-27 ENCOUNTER — Emergency Department (HOSPITAL_COMMUNITY): Payer: 59

## 2016-05-27 ENCOUNTER — Encounter (HOSPITAL_COMMUNITY): Payer: Self-pay | Admitting: *Deleted

## 2016-05-27 DIAGNOSIS — I129 Hypertensive chronic kidney disease with stage 1 through stage 4 chronic kidney disease, or unspecified chronic kidney disease: Secondary | ICD-10-CM | POA: Insufficient documentation

## 2016-05-27 DIAGNOSIS — N182 Chronic kidney disease, stage 2 (mild): Secondary | ICD-10-CM | POA: Insufficient documentation

## 2016-05-27 DIAGNOSIS — E039 Hypothyroidism, unspecified: Secondary | ICD-10-CM | POA: Insufficient documentation

## 2016-05-27 DIAGNOSIS — Z79899 Other long term (current) drug therapy: Secondary | ICD-10-CM | POA: Insufficient documentation

## 2016-05-27 DIAGNOSIS — R079 Chest pain, unspecified: Secondary | ICD-10-CM

## 2016-05-27 LAB — CBC
HCT: 39.6 % (ref 36.0–46.0)
HEMOGLOBIN: 12.9 g/dL (ref 12.0–15.0)
MCH: 28.2 pg (ref 26.0–34.0)
MCHC: 32.6 g/dL (ref 30.0–36.0)
MCV: 86.7 fL (ref 78.0–100.0)
Platelets: 295 10*3/uL (ref 150–400)
RBC: 4.57 MIL/uL (ref 3.87–5.11)
RDW: 13 % (ref 11.5–15.5)
WBC: 6.6 10*3/uL (ref 4.0–10.5)

## 2016-05-27 LAB — BASIC METABOLIC PANEL
ANION GAP: 6 (ref 5–15)
BUN: 11 mg/dL (ref 6–20)
CALCIUM: 9.9 mg/dL (ref 8.9–10.3)
CHLORIDE: 107 mmol/L (ref 101–111)
CO2: 27 mmol/L (ref 22–32)
CREATININE: 1.18 mg/dL — AB (ref 0.44–1.00)
GFR calc Af Amer: >60 mL/min (ref >60)
GFR calc non Af Amer: 54 mL/min — ABNORMAL LOW (ref >60)
GLUCOSE: 94 mg/dL (ref 65–99)
Potassium: 3.6 mmol/L (ref 3.5–5.1)
Sodium: 140 mmol/L (ref 135–145)

## 2016-05-27 LAB — I-STAT TROPONIN, ED
TROPONIN I, POC: 0.01 ng/mL (ref 0.00–0.08)
Troponin i, poc: 0 ng/mL (ref 0.00–0.08)

## 2016-05-27 MED ORDER — IOPAMIDOL (ISOVUE-370) INJECTION 76%
100.0000 mL | Freq: Once | INTRAVENOUS | Status: AC | PRN
  Administered 2016-05-27: 100 mL via INTRAVENOUS

## 2016-05-27 NOTE — ED Provider Notes (Signed)
[facility] DEPT Provider Note   CSN: [id number] Arrival date & time: 05/27/16  1117     History   Chief Complaint Chief Complaint  Patient presents with  . Chest Pain    HPI Rhonda [name] is a 45 y.o. female.  HPI reports a history of pulmonary embolus in 2008, comes in for evaluation of sharp chest pain. She reports an approximately 2:00 this morning she woke up with a sharp shooting pain in her central chest with radiation to her back and associated bilateral leg cramping. She reports symptoms lasted for a few minutes and resolve on their own. Symptoms returned at 5:30 AM, less intense and it also abated. She reports mild cough and nausea, but no emesis, diaphoresis, other numbness or weakness him a shortness of breath, leg swelling. No fevers, chills, abdominal pain, urinary symptoms. Nothing tried to improve symptoms. Nothing makes the problem worse. Discomfort is nonexertional. No other modifying factors  Past Medical History:  Diagnosis Date  . Anxiety   . C1q nephropathy   . Cancer [facility])    history of thyroid cancer  . Chronic constipation   . CKD (chronic kidney disease), stage II nephrologist-  dr Gretchen Short [facility])   secondary to C1q nephropathy/  hypertension  . Family history of adverse reaction to anesthesia    father hard to put down for anesthesia  . GERD (gastroesophageal reflux disease)   . H/O radioactive iodine thyroid ablation    1992  right throid  . History of exercise intolerance    [id number]  ETT--  normal w/ no ischemia per cardiologist (dr Aundra Dubin) epic note  . History of pulmonary embolus (PE)    07/ 2008-- treated w/ coumadin  . History of syncope    recurrent --  workup done per epic -- dx orthostatic hypotension  . Hypertension   . IBS (irritable bowel syndrome)   . Pelvic pain in female   . 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 Proteinuria   . Seasonal and perennial allergic rhinitis     . Wears contact lenses     Patient Active Problem List   Diagnosis Date Noted  . S/P bilateral salpingo-oophorectomy 04/28/2016  . Pelvic adhesions 03/26/2016    Class: Present on Admission  . Vaginitis and vulvovaginitis 04/13/2015  . History of pulmonary embolism 08/01/2014  . Chronic kidney disease 02/27/2014  . Chest pain 04/28/2012  . Hypothyroidism 04/28/2012  . Hemorrhage of rectum and anus 02/09/2011  . Constipation 02/09/2011  . Dyslipidemia 08/26/2009  . BACK PAIN 02/14/2008  . Allergic rhinitis 11/15/2007  . GERD 06/07/2007  . ANXIETY STATE NOS 04/27/2007  . Essential hypertension 04/27/2007  . HYPERTENSION, BENIGN ESSENTIAL 03/17/2007  . Disorder of kidney and ureter 03/17/2007  . EMBOLISM/THROMBOSIS, DEEP VSL PRXML LWR EXTRM 03/16/2007    Past Surgical History:  Procedure Laterality Date  . ABDOMINAL HYSTERECTOMY  1998  . CYSTOSCOPY N/A 04/28/2016   Procedure: CYSTOSCOPY;  Surgeon: Arvella Nigh, MD;  Location: [facility];  Service: Gynecology;  Laterality: N/A;  . ESOPHAGOGASTRODUODENOSCOPY    . LAPAROSCOPIC CHOLECYSTECTOMY  1997  . LAPAROSCOPY N/A 03/26/2016   Procedure: LAPAROSCOPY DIAGNOSTIC, LYSIS OF ADHESIONS;  Surgeon: Arvella Nigh, MD;  Location: [facility];  Service: Gynecology;  Laterality: N/A;  . LAPAROTOMY N/A 04/28/2016   Procedure: EXPLORATORY LAPAROTOMY;  Surgeon: Arvella Nigh, MD;  Location: [facility];  Service: Gynecology;  Laterality: N/A;  . RENAL BIOPSY    . SALPINGOOPHORECTOMY Bilateral  04/28/2016   Procedure: Bilateral OOPHORECTOMY;  Surgeon: Arvella Nigh, MD;  Location: [facility];  Service: Gynecology;  Laterality: Bilateral;  . THYROIDECTOMY Left 1991  . TRANSTHORACIC ECHOCARDIOGRAM  04/27/2012   normal LV, ef 60-65%/  trivial MR  . TUBAL LIGATION  1992    OB History    No data available       Home Medications    Prior to Admission medications   Medication Sig Start Date End Date Taking? Authorizing Provider  albuterol  (PROVENTIL HFA;VENTOLIN HFA) 108 (90 BASE) MCG/ACT inhaler Inhale 2 puffs into the lungs every 6 (six) hours as needed for wheezing or shortness of breath. 06/14/15  Yes Marletta Lor, MD  alum & mag hydroxide-simeth (MAALOX/MYLANTA) 200-200-20 MG/5ML suspension Take 30 mLs by mouth every 6 (six) hours as needed for indigestion or heartburn.    Yes Historical Provider, MD  amLODipine (NORVASC) 5 MG tablet TAKE 1 TABLET BY MOUTH EVERY DAY Patient taking differently: TAKE 5 MG BY MOUTH EVERY MORNING 07/04/15  Yes Marletta Lor, MD  docusate sodium (COLACE) 100 MG capsule Take 100 mg by mouth every morning.    Yes Historical Provider, MD  levothyroxine (SYNTHROID, LEVOTHROID) 150 MCG tablet TAKE 1 TABLET BY MOUTH EVERY DAY BEFORE BREAKFAST Patient taking differently: TAKE 150 MCG BY MOUTH EVERY DAY BEFORE BREAKFAST 07/04/15  Yes Marletta Lor, MD  loratadine (CLARITIN) 10 MG tablet Take 10 mg by mouth every morning.    Yes Historical Provider, MD  metoprolol succinate (TOPROL-XL) 50 MG 24 hr tablet TAKE 1 TABLET BY MOUTH EVERY DAY IMMEDIATELY FOLLOWING A MEAL Patient taking differently: TAKE 50 MG BY MOUTH EVERY EVENING IMMEDIATELY FOLLOWING A MEAL 07/04/15  Yes Marletta Lor, MD  Multiple Vitamin (MULTIVITAMIN) tablet Take 1 tablet by mouth every morning.    Yes Historical Provider, MD  omeprazole (PRILOSEC) 40 MG capsule Take 1 capsule (40 mg total) by mouth daily. Patient taking differently: Take 40 mg by mouth every morning.  06/14/15  Yes Marletta Lor, MD  oxyCODONE (ROXICODONE) 5 MG immediate release tablet Take 1 tablet (5 mg total) by mouth every 4 (four) hours as needed for severe pain. 04/30/16  Yes Arvella Nigh, MD  promethazine (PHENERGAN) 25 MG tablet Take 1 tablet (25 mg total) by mouth every 6 (six) hours as needed for nausea or vomiting. 04/09/16  Yes Marletta Lor, MD  valsartan (DIOVAN) 320 MG tablet Take 320 mg by mouth every morning.  10/17/15  Yes  Historical Provider, MD  acyclovir cream (ZOVIRAX) 5 % Apply 1 application topically 3 (three) times daily. Patient not taking: Reported on 05/27/2016 07/15/15   Marletta Lor, MD  ALPRAZolam Duanne Moron) 0.25 MG tablet Take 1 tablet (0.25 mg total) by mouth 2 (two) times daily as needed for anxiety. Patient not taking: Reported on 05/27/2016 06/14/15   Marletta Lor, MD  oxyCODONE-acetaminophen (PERCOCET) 7.5-325 MG tablet Take 1 tablet by mouth every 4 (four) hours as needed for severe pain. Patient not taking: Reported on 05/27/2016 03/26/16   Arvella Nigh, MD    Family History Family History  Problem Relation Age of Onset  . Hypertension Father   . Coronary artery disease Father   . Diabetes Father   . Diabetes Mother     Pre-diabetic  . Colon cancer Neg Hx   . Colon polyps Neg Hx   . Kidney disease Neg Hx   . Esophageal cancer Neg Hx   . Gallbladder disease Neg Hx  Social History Social History  Substance Use Topics  . Smoking status: Never Smoker  . Smokeless tobacco: Never Used  . Alcohol use No     Allergies   Aspirin; Flagyl [metronidazole]; Food; Tramadol; and Dilaudid [hydromorphone]   Review of Systems Review of Systems A 10 point review of systems was completed and was negative except for pertinent positives and negatives as mentioned in the history of present illness    Physical Exam Updated Vital Signs BP 144/93   Pulse 80   Temp 98.5 F (36.9 C)   Resp 16   Ht 5\' 6"  (1.676 m)   Wt 93.9 kg   SpO2 100%   BMI 33.41 kg/m   Physical Exam  Constitutional: She appears well-developed. No distress.  Awake, alert and nontoxic in appearance  HENT:  Head: Normocephalic and atraumatic.  Right Ear: External ear normal.  Left Ear: External ear normal.  Mouth/Throat: Oropharynx is clear and moist.  Eyes: Conjunctivae and EOM are normal. Pupils are equal, round, and reactive to light.  Neck: Normal range of motion. No JVD present.    Cardiovascular: Normal rate, regular rhythm and normal heart sounds.   Pulmonary/Chest: Effort normal and breath sounds normal. No stridor.  Abdominal: Soft. There is no tenderness.  Musculoskeletal: Normal range of motion. She exhibits edema.  Neurological:  Awake, alert, cooperative and aware of situation; no facial asymmetry; tongue midline; major cranial nerves appear intact;  baseline gait without new ataxia.  Skin: No rash noted. She is not diaphoretic.  Psychiatric: She has a normal mood and affect. Her behavior is normal. Thought content normal.  Nursing note and vitals reviewed.    ED Treatments / Results  Labs (all labs ordered are listed, but only abnormal results are displayed) Labs Reviewed  BASIC METABOLIC PANEL - Abnormal; Notable for the following:       Result Value   Creatinine, Ser 1.18 (*)    GFR calc non Af Amer 54 (*)    All other components within normal limits  CBC  I-STAT TROPOININ, ED  I-STAT TROPOININ, ED    EKG  EKG Interpretation  Date/Time:  Wednesday May 27 2016 11:29:41 EDT Ventricular Rate:  68 PR Interval:    QRS Duration: 84 QT Interval:  362 QTC Calculation: 385 R Axis:   72 Text Interpretation:  Sinus rhythm Nonspecific T abnormalities, diffuse leads since last tracing no significant change Confirmed by BELFI  MD, MELANIE ([id number]) on 05/27/2016 11:48:19 AM Also confirmed by BELFI  MD, MELANIE ([id number]), editor [place]  CCT, BEVERLY ([id number])  on 05/27/2016 11:52:04 AM       Radiology Dg Chest 2 View  Result Date: 05/27/2016 CLINICAL DATA:  New mid chest pain radiates into mid upper back with sob since 0200hrs this a.m, nonsmoker, states prev hx of pulmonary emboli EXAM: CHEST  2 VIEW COMPARISON:  11/16/2015 FINDINGS: Clips related to prior thyroidectomy. Left C7 cervical rib. Mild enlargement of the cardiopericardial silhouette, without edema. No pleural effusion identified. IMPRESSION: 1. Mild enlargement of the cardiopericardial  silhouette, without edema. 2. Incidental left C7 cervical rib. Electronically Signed   By: Van Clines M.D.   On: 05/27/2016 12:00   Ct Angio Chest Pe W/cm &/or Wo Cm  Result Date: 05/27/2016 CLINICAL DATA:  45 year old female with complaint of sharp central chest pain described as 7 out of 10 in severity since 2 a.m. this morning. Pain radiates to the upper back. Shortness of breath. Prior history of pulmonary embolism in 2008.  EXAM: CT ANGIOGRAPHY CHEST WITH CONTRAST TECHNIQUE: Multidetector CT imaging of the chest was performed using the standard protocol during bolus administration of intravenous contrast. Multiplanar CT image reconstructions and MIPs were obtained to evaluate the vascular anatomy. CONTRAST:  100 mL of Isovue 370. COMPARISON:  Chest CT 11/16/2015. FINDINGS: Cardiovascular: No filling defect within the pulmonary arterial tree to suggest underlying pulmonary embolism. Heart size is normal. There is no significant pericardial fluid, thickening or pericardial calcification. No acute abnormality of the thoracic aorta or the great vessels of the mediastinum. Mediastinum/Nodes: Postoperative changes of thyroidectomy. No pathologically enlarged mediastinal or hilar lymph nodes. Esophagus is unremarkable in appearance. No axillary lymphadenopathy. Lungs/Pleura: No acute consolidative airspace disease. No pleural effusions. No suspicious appearing pulmonary nodules or masses. Upper Abdomen: Unremarkable. Musculoskeletal: There are no aggressive appearing lytic or blastic lesions noted in the visualized portions of the skeleton. Review of the MIP images confirms the above findings. IMPRESSION: 1. No evidence of pulmonary embolism. 2. No acute findings in the thorax to account for the patient's symptoms. Electronically Signed   By: Vinnie Langton M.D.   On: 05/27/2016 13:59    Procedures Procedures (including critical care time)  Medications Ordered in ED Medications  iopamidol  (ISOVUE-370) 76 % injection 100 mL (100 mLs Intravenous Contrast Given 05/27/16 1338)     Initial Impression / Assessment and Plan / ED Course  I have reviewed the triage vital signs and the nursing notes.  Pertinent labs & imaging results that were available during my care of the patient were reviewed by me and considered in my medical decision making (see chart for details).  Clinical Course    Patient with history of PE comes in complaining of chest pain may be similar to previous PE. Not currently on anticoagulation. Hemodynamically stable, no hypoxia, tachypnea or other vital sign changes. However given history, we will obtain CT to rule out pulmonary embolus. CT shows no evidence of pulmonary embolus or other emergent intrathoracic pathology. EKG is reassuring, delta troponin is negative. Other screening labs are also reassuring. Patient with likely nonspecific/atypical chest discomfort. No discomfort in the ED. Recommended follow-up with PCP. Return precautions discussed.  Final Clinical Impressions(s) / ED Diagnoses   Final diagnoses:  Nonspecific chest pain    New Prescriptions New Prescriptions   No medications on file     Comer Locket, PA-C 05/27/16 [place], MD 06/02/16 1820

## 2016-05-27 NOTE — ED Triage Notes (Signed)
Pt complains of sharp central 7/10 chest pain since 2AM this morning. Pt states the pain radiates to upper back. Pt also complains of shortness of breath. Pt had ovaries and tubes removed 04/28/16. Pt states she had similar symptoms when she had a pulmonary embolism in the 2008.

## 2016-05-27 NOTE — ED Notes (Signed)
PT DISCHARGED. INSTRUCTIONS GIVEN. AAOX4. PT IN NO APPARENT DISTRESS OR PAIN. THE OPPORTUNITY TO ASK QUESTIONS WAS PROVIDED. 

## 2016-05-27 NOTE — Discharge Instructions (Signed)
There does not appear to be an emergent cause for your symptoms at this time. Your labs, and also the CT scan, EKG are all very reassuring. Please follow-up with your doctor for further evaluation management of your symptoms. Return to ED for new or worsening symptoms as we discussed.

## 2016-06-08 ENCOUNTER — Other Ambulatory Visit: Payer: BLUE CROSS/BLUE SHIELD | Admitting: Internal Medicine

## 2016-06-10 ENCOUNTER — Other Ambulatory Visit: Payer: BLUE CROSS/BLUE SHIELD | Admitting: Internal Medicine

## 2016-06-14 ENCOUNTER — Other Ambulatory Visit: Payer: Self-pay | Admitting: Internal Medicine

## 2016-06-15 ENCOUNTER — Encounter: Payer: BLUE CROSS/BLUE SHIELD | Admitting: Internal Medicine

## 2016-06-25 ENCOUNTER — Ambulatory Visit (INDEPENDENT_AMBULATORY_CARE_PROVIDER_SITE_OTHER): Payer: 59 | Admitting: Family Medicine

## 2016-06-25 ENCOUNTER — Encounter: Payer: Self-pay | Admitting: Family Medicine

## 2016-06-25 ENCOUNTER — Other Ambulatory Visit: Payer: Self-pay | Admitting: Internal Medicine

## 2016-06-25 VITALS — BP 128/84 | HR 73 | Temp 98.8°F | Ht 66.0 in | Wt 209.2 lb

## 2016-06-25 DIAGNOSIS — J0100 Acute maxillary sinusitis, unspecified: Secondary | ICD-10-CM | POA: Diagnosis not present

## 2016-06-25 MED ORDER — DOXYCYCLINE HYCLATE 100 MG PO CAPS: 100.0000 mg | ORAL_CAPSULE | Freq: Two times a day (BID) | ORAL | 0 refills | Status: DC

## 2016-06-25 NOTE — Progress Notes (Signed)
HPI:  Rhonda [name] is a pleasant 45 year old here for an acute visit for:  Sinus and head pain: -reports hx chronic sinus congestion with allergies, takes zyrtec -also hx tension headaches, does a lot of computer work -for the last 5 days has had congestion, sinus pressure and R maxillary pain, R ear pain at times, occ lightheadedness, pnd, occ head pain -denies: fevers, nausea, vomiting, diarrhea, malaise, weakness, numbness, vision changes, SOB -reports amoxicillin does not work for her -has not taken any medications for this -wanted to check BP   ROS: See pertinent positives and negatives per HPI.  Past Medical History:  Diagnosis Date  . Anxiety   . C1q nephropathy   . Cancer [facility])    history of thyroid cancer  . Chronic constipation   . CKD (chronic kidney disease), stage II nephrologist-  dr Gretchen Short [facility])   secondary to C1q nephropathy/  hypertension  . Family history of adverse reaction to anesthesia    father hard to put down for anesthesia  . GERD (gastroesophageal reflux disease)   . H/O radioactive iodine thyroid ablation    1992  right throid  . History of exercise intolerance    01-052011  ETT--  normal w/ no ischemia per cardiologist (dr Aundra Dubin) epic note  . History of pulmonary embolus (PE)    07/ 2008-- treated w/ coumadin  . History of syncope    recurrent --  workup done per epic -- dx orthostatic hypotension  . Hypertension   . IBS (irritable bowel syndrome)   . Pelvic pain in female   . 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 Proteinuria   . Seasonal and perennial allergic rhinitis   . Wears contact lenses     Past Surgical History:  Procedure Laterality Date  . ABDOMINAL HYSTERECTOMY  1998  . CYSTOSCOPY N/A 04/28/2016   Procedure: CYSTOSCOPY;  Surgeon: Arvella Nigh, MD;  Location: [facility];  Service: Gynecology;  Laterality: N/A;  . ESOPHAGOGASTRODUODENOSCOPY    . LAPAROSCOPIC  CHOLECYSTECTOMY  1997  . LAPAROSCOPY N/A 03/26/2016   Procedure: LAPAROSCOPY DIAGNOSTIC, LYSIS OF ADHESIONS;  Surgeon: Arvella Nigh, MD;  Location: [place];  Service: Gynecology;  Laterality: N/A;  . LAPAROTOMY N/A 04/28/2016   Procedure: EXPLORATORY LAPAROTOMY;  Surgeon: Arvella Nigh, MD;  Location: [facility];  Service: Gynecology;  Laterality: N/A;  . RENAL BIOPSY    . SALPINGOOPHORECTOMY Bilateral 04/28/2016   Procedure: Bilateral OOPHORECTOMY;  Surgeon: Arvella Nigh, MD;  Location: [facility];  Service: Gynecology;  Laterality: Bilateral;  . THYROIDECTOMY Left 1991  . TRANSTHORACIC ECHOCARDIOGRAM  04/27/2012   normal LV, ef 60-65%/  trivial MR  . TUBAL LIGATION  1992    Family History  Problem Relation Age of Onset  . Hypertension Father   . Coronary artery disease Father   . Diabetes Father   . Diabetes Mother     Pre-diabetic  . Colon cancer Neg Hx   . Colon polyps Neg Hx   . Kidney disease Neg Hx   . Esophageal cancer Neg Hx   . Gallbladder disease Neg Hx     Social History   Social History  . Marital status: Single    Spouse name: N/A  . Number of children: 2  . Years of education: N/A   Occupational History  . [employer]   Social History Main Topics  . Smoking status: Never Smoker  . Smokeless tobacco: Never Used  . Alcohol  use No  . Drug use: No  . Sexual activity: Yes    Birth control/ protection: Surgical   Other Topics Concern  . None   Social History Narrative  . None     Current Outpatient Prescriptions:  .  acyclovir cream (ZOVIRAX) 5 %, Apply 1 application topically 3 (three) times daily., Disp: 15 g, Rfl: 1 .  albuterol (PROVENTIL HFA;VENTOLIN HFA) 108 (90 BASE) MCG/ACT inhaler, Inhale 2 puffs into the lungs every 6 (six) hours as needed for wheezing or shortness of breath., Disp: 1 Inhaler, Rfl: 5 .  ALPRAZolam (XANAX) 0.25 MG tablet, Take 1 tablet (0.25 mg total) by mouth 2 (two) times daily as needed for  anxiety., Disp: 60 tablet, Rfl: 2 .  alum & mag hydroxide-simeth (MAALOX/MYLANTA) 200-200-20 MG/5ML suspension, Take 30 mLs by mouth every 6 (six) hours as needed for indigestion or heartburn. , Disp: , Rfl:  .  amLODipine (NORVASC) 5 MG tablet, TAKE 1 TABLET BY MOUTH EVERY DAY (Patient taking differently: TAKE 5 MG BY MOUTH EVERY MORNING), Disp: 30 tablet, Rfl: 5 .  docusate sodium (COLACE) 100 MG capsule, Take 100 mg by mouth every morning. , Disp: , Rfl:  .  levothyroxine (SYNTHROID, LEVOTHROID) 150 MCG tablet, TAKE 1 TABLET BY MOUTH EVERY DAY BEFORE BREAKFAST (Patient taking differently: TAKE 150 MCG BY MOUTH EVERY DAY BEFORE BREAKFAST), Disp: 30 tablet, Rfl: 5 .  loratadine (CLARITIN) 10 MG tablet, Take 10 mg by mouth every morning. , Disp: , Rfl:  .  metoprolol succinate (TOPROL-XL) 50 MG 24 hr tablet, TAKE 1 TABLET BY MOUTH EVERY DAY IMMEDIATELY FOLLOWING A MEAL (Patient taking differently: TAKE 50 MG BY MOUTH EVERY EVENING IMMEDIATELY FOLLOWING A MEAL), Disp: 30 tablet, Rfl: 5 .  Multiple Vitamin (MULTIVITAMIN) tablet, Take 1 tablet by mouth every morning. , Disp: , Rfl:  .  omeprazole (PRILOSEC) 40 MG capsule, TAKE 1 CAPSULE(40 MG) BY MOUTH DAILY, Disp: 30 capsule, Rfl: 5 .  oxyCODONE (ROXICODONE) 5 MG immediate release tablet, Take 1 tablet (5 mg total) by mouth every 4 (four) hours as needed for severe pain., Disp: 30 tablet, Rfl: 0 .  oxyCODONE-acetaminophen (PERCOCET) 7.5-325 MG tablet, Take 1 tablet by mouth every 4 (four) hours as needed for severe pain., Disp: 30 tablet, Rfl: 0 .  promethazine (PHENERGAN) 25 MG tablet, Take 1 tablet (25 mg total) by mouth every 6 (six) hours as needed for nausea or vomiting., Disp: 30 tablet, Rfl: 2 .  valsartan (DIOVAN) 320 MG tablet, Take 320 mg by mouth every morning. , Disp: , Rfl: 5 .  doxycycline (VIBRAMYCIN) 100 MG capsule, Take 1 capsule (100 mg total) by mouth 2 (two) times daily., Disp: 20 capsule, Rfl: 0  EXAM:  Vitals:   06/25/16 1436   BP: 128/84  Pulse: 73  Temp: 98.8 F (37.1 C)    Body mass index is 33.77 kg/m.  GENERAL: vitals reviewed and listed above, alert, oriented, appears well hydrated and in no acute distress  HEENT: atraumatic, conjunttiva clear, no obvious abnormalities on inspection of external nose and ears, normal appearance of ear canals and TMs except for effusion bilat, yellow nasal congestion R > L, mild post oropharyngeal erythema with PND, no tonsillar edema or exudate, R maxillary sinus TTP  NECK: no obvious masses on inspection, no bruit, no bony TTP, some sub occ muscle TTP, no meningeal signs, few shotty right anterior cervical nodes  LUNGS: clear to auscultation bilaterally, no wheezes, rales or rhonchi, good air movement  CV: HRRR, no peripheral edema  MS: moves all extremities without noticeable abnormality  PSYCH: pleasant and cooperative, no obvious depression or anxiety, cranial nerves II through XII grossly intact, finger to nose normal, acuity grossly intact, speech and thought processing grossly intact, gait normal   ASSESSMENT AND PLAN:  Discussed the following assessment and plan:  Acute maxillary sinusitis, recurrence not specified  -Suspect sinusitis, discussed treatment options and risk and decided to treat doxycycline  -May also have suboccipital muscle tension from posture and has a history of tension headaches  - advised postural exercises for this  -Advised to return or notify a doctor immediately if symptoms worsen or persist or new concerns arise.  She declined AVS.    There are no Patient Instructions on file for this visit.  Colin Benton R., DO

## 2016-06-25 NOTE — Progress Notes (Signed)
Pre visit review using our clinic review tool, if applicable. No additional management support is needed unless otherwise documented below in the visit note. 

## 2016-07-11 ENCOUNTER — Emergency Department (HOSPITAL_COMMUNITY): Payer: Self-pay

## 2016-07-11 ENCOUNTER — Emergency Department (HOSPITAL_COMMUNITY)
Admission: EM | Admit: 2016-07-11 | Discharge: 2016-07-11 | Disposition: A | Discharge status: Home / Self Care | Payer: Self-pay | Attending: Emergency Medicine | Admitting: Emergency Medicine

## 2016-07-11 ENCOUNTER — Encounter (HOSPITAL_COMMUNITY): Payer: Self-pay | Admitting: Emergency Medicine

## 2016-07-11 DIAGNOSIS — R519 Headache, unspecified: Secondary | ICD-10-CM

## 2016-07-11 DIAGNOSIS — R51 Headache: Secondary | ICD-10-CM | POA: Insufficient documentation

## 2016-07-11 DIAGNOSIS — Z79899 Other long term (current) drug therapy: Secondary | ICD-10-CM | POA: Insufficient documentation

## 2016-07-11 DIAGNOSIS — N182 Chronic kidney disease, stage 2 (mild): Secondary | ICD-10-CM | POA: Insufficient documentation

## 2016-07-11 DIAGNOSIS — I129 Hypertensive chronic kidney disease with stage 1 through stage 4 chronic kidney disease, or unspecified chronic kidney disease: Secondary | ICD-10-CM | POA: Insufficient documentation

## 2016-07-11 DIAGNOSIS — E039 Hypothyroidism, unspecified: Secondary | ICD-10-CM | POA: Insufficient documentation

## 2016-07-11 DIAGNOSIS — Z85850 Personal history of malignant neoplasm of thyroid: Secondary | ICD-10-CM | POA: Insufficient documentation

## 2016-07-11 LAB — BASIC METABOLIC PANEL
Anion gap: 7 (ref 5–15)
BUN: 19 mg/dL (ref 6–20)
CALCIUM: 9.9 mg/dL (ref 8.9–10.3)
CHLORIDE: 106 mmol/L (ref 101–111)
CO2: 26 mmol/L (ref 22–32)
CREATININE: 1.24 mg/dL — AB (ref 0.44–1.00)
GFR calc Af Amer: 59 mL/min — ABNORMAL LOW (ref >60)
GFR calc non Af Amer: 51 mL/min — ABNORMAL LOW (ref >60)
Glucose, Bld: 93 mg/dL (ref 65–99)
Potassium: 3.9 mmol/L (ref 3.5–5.1)
SODIUM: 139 mmol/L (ref 135–145)

## 2016-07-11 LAB — CBC
HCT: 40.3 % (ref 36.0–46.0)
HEMOGLOBIN: 12.9 g/dL (ref 12.0–15.0)
MCH: 27.7 pg (ref 26.0–34.0)
MCHC: 32 g/dL (ref 30.0–36.0)
MCV: 86.5 fL (ref 78.0–100.0)
Platelets: 291 10*3/uL (ref 150–400)
RBC: 4.66 MIL/uL (ref 3.87–5.11)
RDW: 13.1 % (ref 11.5–15.5)
WBC: 7.4 10*3/uL (ref 4.0–10.5)

## 2016-07-11 MED ORDER — DIPHENHYDRAMINE HCL 50 MG/ML IJ SOLN
25.0000 mg | Freq: Once | INTRAMUSCULAR | Status: AC
  Administered 2016-07-11: 25 mg via INTRAVENOUS
  Filled 2016-07-11: qty 1

## 2016-07-11 MED ORDER — SODIUM CHLORIDE 0.9 % IV BOLUS (SEPSIS)
1000.0000 mL | Freq: Once | INTRAVENOUS | Status: AC
  Administered 2016-07-11: 1000 mL via INTRAVENOUS

## 2016-07-11 MED ORDER — METOCLOPRAMIDE HCL 5 MG/ML IJ SOLN
10.0000 mg | Freq: Once | INTRAMUSCULAR | Status: AC
  Administered 2016-07-11: 10 mg via INTRAVENOUS
  Filled 2016-07-11: qty 2

## 2016-07-11 NOTE — ED Triage Notes (Signed)
Patient here from home with complaints of headache in the back of head radiating up in to front. Nausea and dizziness. Denies blurred vision.

## 2016-07-11 NOTE — ED Provider Notes (Signed)
[facility] DEPT Provider Note   CSN: [id number] Arrival date & time: 07/11/16  1217     History   Chief Complaint Chief Complaint  Patient presents with  . Headache  . Dizziness    HPI Rhonda [name] is a 45 y.o. female.  HPI  45 year old female presents with a headache. The headache is occipital and feels like a burning. It then radiates to the front of her head. Saw her PCP when it started and was told it could be sinusitis and started on antibiotics. Patient has not taken anything for this headache. It originally started about 3 weeks ago. Has been on off since then. Sometimes lasts a few hours, sometimes a couple days. This most recent headache started last night. Was not too bad but then woke her up at 5 AM this morning. Progressively worsening. Nausea without vomiting. No blurry vision or photophobia. Today she has also felt off balance when walking, intermittently. 2 months ago she had her ovaries removed and was told to watch out for headaches but has not followed up with her GYN. She denies any fevers, neck pain, neck stiffness, or weakness/numbness. Has had ear pain during these 3 weeks as well.  Past Medical History:  Diagnosis Date  . Anxiety   . C1q nephropathy   . Cancer [facility])    history of thyroid cancer  . Chronic constipation   . CKD (chronic kidney disease), stage II nephrologist-  dr Gretchen Short [facility])   secondary to C1q nephropathy/  hypertension  . Family history of adverse reaction to anesthesia    father hard to put down for anesthesia  . GERD (gastroesophageal reflux disease)   . H/O radioactive iodine thyroid ablation    1992  right throid  . History of exercise intolerance    01-052011  ETT--  normal w/ no ischemia per cardiologist (dr Aundra Dubin) epic note  . History of pulmonary embolus (PE)    07/ 2008-- treated w/ coumadin  . History of syncope    recurrent --  workup done per epic -- dx orthostatic hypotension  .  Hypertension   . IBS (irritable bowel syndrome)   . Pelvic pain in female   . 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 Proteinuria   . Seasonal and perennial allergic rhinitis   . Wears contact lenses     Patient Active Problem List   Diagnosis Date Noted  . S/P bilateral salpingo-oophorectomy 04/28/2016  . Pelvic adhesions 03/26/2016    Class: Present on Admission  . Vaginitis and vulvovaginitis 04/13/2015  . History of pulmonary embolism 08/01/2014  . Chronic kidney disease 02/27/2014  . Chest pain 04/28/2012  . Hypothyroidism 04/28/2012  . Hemorrhage of rectum and anus 02/09/2011  . Constipation 02/09/2011  . Dyslipidemia 08/26/2009  . BACK PAIN 02/14/2008  . Allergic rhinitis 11/15/2007  . GERD 06/07/2007  . ANXIETY STATE NOS 04/27/2007  . Essential hypertension 04/27/2007  . HYPERTENSION, BENIGN ESSENTIAL 03/17/2007  . Disorder of kidney and ureter 03/17/2007  . EMBOLISM/THROMBOSIS, DEEP VSL PRXML LWR EXTRM 03/16/2007    Past Surgical History:  Procedure Laterality Date  . ABDOMINAL HYSTERECTOMY  1998  . CYSTOSCOPY N/A 04/28/2016   Procedure: CYSTOSCOPY;  Surgeon: Arvella Nigh, MD;  Location: [facility];  Service: Gynecology;  Laterality: N/A;  . ESOPHAGOGASTRODUODENOSCOPY    . LAPAROSCOPIC CHOLECYSTECTOMY  1997  . LAPAROSCOPY N/A 03/26/2016   Procedure: LAPAROSCOPY DIAGNOSTIC, LYSIS OF ADHESIONS;  Surgeon: Arvella Nigh, MD;  Location: [place];  Service: Gynecology;  Laterality: N/A;  . LAPAROTOMY N/A 04/28/2016   Procedure: EXPLORATORY LAPAROTOMY;  Surgeon: Arvella Nigh, MD;  Location: [facility];  Service: Gynecology;  Laterality: N/A;  . RENAL BIOPSY    . SALPINGOOPHORECTOMY Bilateral 04/28/2016   Procedure: Bilateral OOPHORECTOMY;  Surgeon: Arvella Nigh, MD;  Location: [facility];  Service: Gynecology;  Laterality: Bilateral;  . THYROIDECTOMY Left 1991  . TRANSTHORACIC ECHOCARDIOGRAM  04/27/2012   normal LV, ef 60-65%/  trivial MR  . TUBAL  LIGATION  1992    OB History    No data available       Home Medications    Prior to Admission medications   Medication Sig Start Date End Date Taking? Authorizing Provider  acyclovir cream (ZOVIRAX) 5 % Apply 1 application topically 3 (three) times daily. 07/15/15   Marletta Lor, MD  albuterol (PROVENTIL HFA;VENTOLIN HFA) 108 (90 BASE) MCG/ACT inhaler Inhale 2 puffs into the lungs every 6 (six) hours as needed for wheezing or shortness of breath. 06/14/15   Marletta Lor, MD  ALPRAZolam Duanne Moron) 0.25 MG tablet Take 1 tablet (0.25 mg total) by mouth 2 (two) times daily as needed for anxiety. 06/14/15   Marletta Lor, MD  alum & mag hydroxide-simeth (MAALOX/MYLANTA) 200-200-20 MG/5ML suspension Take 30 mLs by mouth every 6 (six) hours as needed for indigestion or heartburn.     Historical Provider, MD  amLODipine (NORVASC) 5 MG tablet TAKE 1 TABLET BY MOUTH EVERY DAY Patient taking differently: TAKE 5 MG BY MOUTH EVERY MORNING 07/04/15   Marletta Lor, MD  docusate sodium (COLACE) 100 MG capsule Take 100 mg by mouth every morning.     Historical Provider, MD  doxycycline (VIBRAMYCIN) 100 MG capsule Take 1 capsule (100 mg total) by mouth 2 (two) times daily. 06/25/16   Lucretia Kern, DO  levothyroxine (SYNTHROID, LEVOTHROID) 150 MCG tablet TAKE 1 TABLET BY MOUTH EVERY DAY BEFORE BREAKFAST Patient taking differently: TAKE 150 MCG BY MOUTH EVERY DAY BEFORE BREAKFAST 07/04/15   Marletta Lor, MD  loratadine (CLARITIN) 10 MG tablet Take 10 mg by mouth every morning.     Historical Provider, MD  metoprolol succinate (TOPROL-XL) 50 MG 24 hr tablet TAKE 1 TABLET BY MOUTH EVERY DAY IMMEDIATELY FOLLOWING A MEAL Patient taking differently: TAKE 50 MG BY MOUTH EVERY EVENING IMMEDIATELY FOLLOWING A MEAL 07/04/15   Marletta Lor, MD  Multiple Vitamin (MULTIVITAMIN) tablet Take 1 tablet by mouth every morning.     Historical Provider, MD  omeprazole (PRILOSEC) 40 MG  capsule TAKE 1 CAPSULE(40 MG) BY MOUTH DAILY 06/25/16   Marletta Lor, MD  oxyCODONE (ROXICODONE) 5 MG immediate release tablet Take 1 tablet (5 mg total) by mouth every 4 (four) hours as needed for severe pain. 04/30/16   Arvella Nigh, MD  oxyCODONE-acetaminophen (PERCOCET) 7.5-325 MG tablet Take 1 tablet by mouth every 4 (four) hours as needed for severe pain. 03/26/16   Arvella Nigh, MD  promethazine (PHENERGAN) 25 MG tablet Take 1 tablet (25 mg total) by mouth every 6 (six) hours as needed for nausea or vomiting. 04/09/16   Marletta Lor, MD  valsartan (DIOVAN) 320 MG tablet Take 320 mg by mouth every morning.  10/17/15   Historical Provider, MD    Family History Family History  Problem Relation Age of Onset  . Hypertension Father   . Coronary artery disease Father   . Diabetes Father   . Diabetes Mother  Pre-diabetic  . Colon cancer Neg Hx   . Colon polyps Neg Hx   . Kidney disease Neg Hx   . Esophageal cancer Neg Hx   . Gallbladder disease Neg Hx     Social History Social History  Substance Use Topics  . Smoking status: Never Smoker  . Smokeless tobacco: Never Used  . Alcohol use No     Allergies   Aspirin; Flagyl [metronidazole]; Food; Tramadol; and Dilaudid [hydromorphone]   Review of Systems Review of Systems  Constitutional: Negative for fever.  Eyes: Negative for photophobia and visual disturbance.  Gastrointestinal: Positive for nausea. Negative for vomiting.  Musculoskeletal: Negative for neck pain and neck stiffness.  Neurological: Positive for dizziness and headaches. Negative for weakness and numbness.  All other systems reviewed and are negative.    Physical Exam Updated Vital Signs BP 137/90 (BP Location: Left Arm)   Pulse 71   Temp 98.5 F (36.9 C) (Oral)   Resp 18   SpO2 100%   Physical Exam  Constitutional: She is oriented to person, place, and time. She appears well-developed and well-nourished. No distress.  Resting comfortably.  Sitting up  HENT:  Head: Normocephalic and atraumatic.  Right Ear: Tympanic membrane, external ear and ear canal normal.  Left Ear: Tympanic membrane, external ear and ear canal normal.  Nose: Nose normal.  Eyes: EOM are normal. Pupils are equal, round, and reactive to light. Right eye exhibits no discharge. Left eye exhibits no discharge.  Neck: Normal range of motion. Neck supple.  Cardiovascular: Normal rate, regular rhythm and normal heart sounds.   Pulmonary/Chest: Effort normal and breath sounds normal.  Abdominal: Soft. There is no tenderness.  Neurological: She is alert and oriented to person, place, and time.  CN 3-12 grossly intact. 5/5 strength in all 4 extremities. Grossly normal sensation. Normal finger to nose. Normal gait  Skin: Skin is warm and dry. She is not diaphoretic.  Nursing note and vitals reviewed.    ED Treatments / Results  Labs (all labs ordered are listed, but only abnormal results are displayed) Labs Reviewed  BASIC METABOLIC PANEL - Abnormal; Notable for the following:       Result Value   Creatinine, Ser 1.24 (*)    GFR calc non Af Amer 51 (*)    GFR calc Af Amer 59 (*)    All other components within normal limits  CBC    EKG  EKG Interpretation None       Radiology Ct Head Wo Contrast  Result Date: 07/11/2016 CLINICAL DATA:  Headache, nausea, dizziness EXAM: CT HEAD WITHOUT CONTRAST TECHNIQUE: Contiguous axial images were obtained from the base of the skull through the vertex without intravenous contrast. COMPARISON:  05/24/2007 FINDINGS: Brain: No intracranial hemorrhage, mass effect or midline shift. No acute cortical infarction. No mass lesion is noted on this unenhanced scan. No hydrocephalus. The gray and white-matter differentiation is preserved. Vascular: No hyperdense vessel or unexpected calcification. Skull: Normal. Negative for fracture or focal lesion. Sinuses/Orbits: No acute finding. Other: None. IMPRESSION: No acute  intracranial abnormality. No definite acute cortical infarction. Electronically Signed   By: Lahoma Crocker M.D.   On: 07/11/2016 14:34    Procedures Procedures (including critical care time)  Medications Ordered in ED Medications  sodium chloride 0.9 % bolus 1,000 mL (1,000 mLs Intravenous New Bag/Given 07/11/16 1435)  metoCLOPramide (REGLAN) injection 10 mg (10 mg Intravenous Given 07/11/16 1436)  diphenhydrAMINE (BENADRYL) injection 25 mg (25 mg Intravenous Given 07/11/16 1436)  Initial Impression / Assessment and Plan / ED Course  I have reviewed the triage vital signs and the nursing notes.  Pertinent labs & imaging results that were available during my care of the patient were reviewed by me and considered in my medical decision making (see chart for details).  Clinical Course as of Jul 11 1602  Sat Jul 11, 2016  1412 Given length of symptoms, will get CT to r/o obvious mass. Basic labs with dizziness/off balance, fluids, reglan/benadryl.  [SG]  1600 Headache is improved. Patient feels better and feels well enough to go home. Labs are stable. CT unremarkable with no obvious mass. My suspicion for subarachnoid hemorrhage or meningitis is quite low. Follow-up closely with PCP.  [SG]    Clinical Course User Index [SG] Sherwood Gambler, MD    Final Clinical Impressions(s) / ED Diagnoses   Final diagnoses:  Occipital headache    New Prescriptions New Prescriptions   No medications on file     Sherwood Gambler, MD 07/11/16 [phone]

## 2016-08-25 ENCOUNTER — Encounter: Payer: Self-pay | Admitting: Neurology

## 2016-08-25 ENCOUNTER — Telehealth: Payer: Self-pay | Admitting: Neurology

## 2016-08-25 ENCOUNTER — Ambulatory Visit (INDEPENDENT_AMBULATORY_CARE_PROVIDER_SITE_OTHER): Payer: BLUE CROSS/BLUE SHIELD | Admitting: Neurology

## 2016-08-25 VITALS — BP 137/95 | HR 76 | Resp 20 | Ht 65.5 in | Wt 207.0 lb

## 2016-08-25 DIAGNOSIS — G4709 Other insomnia: Secondary | ICD-10-CM | POA: Diagnosis not present

## 2016-08-25 DIAGNOSIS — R519 Headache, unspecified: Secondary | ICD-10-CM

## 2016-08-25 DIAGNOSIS — R51 Headache: Secondary | ICD-10-CM

## 2016-08-25 MED ORDER — AMITRIPTYLINE HCL 10 MG PO TABS: 10.0000 mg | ORAL_TABLET | Freq: Every day | ORAL | 6 refills | Status: DC

## 2016-08-25 MED ORDER — SUMATRIPTAN SUCCINATE 100 MG PO TABS: 100.0000 mg | ORAL_TABLET | Freq: Once | ORAL | 12 refills | Status: DC | PRN

## 2016-08-25 NOTE — Telephone Encounter (Signed)
Sent referral [facility] . Fax [phone].

## 2016-08-25 NOTE — Progress Notes (Signed)
[facility]    Provider:  Dr Jaynee Eagles Referring Provider: Arvella Nigh MD Primary Care Physician:  Arvella Nigh MD  CC:  Headaches after bilateral salpingo-oophorectomy for pelvic adhesions  HPI:  Rhonda [name] is a 46 y.o. female here as a referral from Dr. Burnice Logan for headaches. In September of this shear she had exploratory lap and bilateral salpingo-oophorectomy for pelvic adhesions, hypertension, thyroid disease. She never had headaches before the surgery. The headaches started 2 months after the surgery. The headaches are in the middle of the back of the head, she feels burning and cold or a dull ache or throbbing. It can be severe with nausea and dizziness/lightheadness. She was getting the headaches every day but it has been improving. She has this dull ache every day continuously. She has sound sensitivity, the light does not bother her. No fevers, neck pain. The headache is random, not positional or time of day dependent. She is having difficulty sleeping as well, decreased appetite. She does have a lot of musculoskeletal neck pain. No family history of migraines. No other focal neurologic deficits, associated symptoms, modifiable factors, inciting events. No vision changes or focal weakness or sensory changes.  Reviewed notes, labs and imaging from outside physicians, which showed:   In September of this shear she had exploratory lap and bilateral salpingo-oophorectomy for pelvic adhesions. Since then she noticed a burning sensation in the back of her head, some associated nausea and not sleeping well. She was seen by her family doctor and they treated her for sinusitis. Subsequently she went to the emergency room they did a CAT scan that was normal. Blood work was normal. Reviewed emergency room notes, patient was seen in the emergency room November 25 of this year. She was complaining of dizziness and headache. The headache is occipital and feels like a burning, and  then radiates to the front of the head. She was treated for sinusitis with antibiotics. Sometimes lasts a few hours, sometimes lasts a couple days. Most recent headache started the night before she went to the emergency room and will grab 5 AM. Progressively worsening, nausea without vomiting, no vision changes, intermittent dizziness. 2 months ago she had her ovaries removed was told to watch out for headaches but has not followed up with her GYN. She denied any fevers, neck pain, neck stiffness or weakness numbness. CT of the head and lab work was unremarkable. She was given Reglan and Benadryl and discharged.    CT head showed No acute intracranial abnormalities including mass lesion or mass effect, hydrocephalus, extra-axial fluid collection, midline shift, hemorrhage, or acute infarction, large ischemic events (personally reviewed images)   Reviewed EKG tracing, Qtc 385  BMP with elevated creatinine 1.24, cbc normal  Review of Systems: Patient complains of symptoms per HPI as well as the following symptoms: Feeling hot, feeling cold, ringing in ears, spinning sensation, runny nose, headache, dizziness, insomnia, not enough sleep, disinterest in activities. Pertinent negatives per HPI. All others negative.   Social History   Social History  . Marital status: Single    Spouse name: N/A  . Number of children: 2  . Years of education: N/A   Occupational History  . [employer]   Social History Main Topics  . Smoking status: Never Smoker  . Smokeless tobacco: Never Used  . Alcohol use No  . Drug use: No  . Sexual activity: Yes    Birth control/ protection: Surgical   Other Topics Concern  . Not  on file   Social History Narrative  . No narrative on file    Family History  Problem Relation Age of Onset  . Hypertension Father   . Coronary artery disease Father   . Diabetes Father   . Stroke Father   . Prostate cancer Father   . Diabetes Mother      Pre-diabetic  . Hypertension Mother   . Colon cancer Neg Hx   . Colon polyps Neg Hx   . Kidney disease Neg Hx   . Esophageal cancer Neg Hx   . Gallbladder disease Neg Hx     Past Medical History:  Diagnosis Date  . Anxiety   . C1q nephropathy   . Cancer [facility])    history of thyroid cancer  . Chronic constipation   . CKD (chronic kidney disease), stage II nephrologist-  dr Gretchen Short [facility])   secondary to C1q nephropathy/  hypertension  . Family history of adverse reaction to anesthesia    father hard to put down for anesthesia  . GERD (gastroesophageal reflux disease)   . H/O radioactive iodine thyroid ablation    1992  right throid  . History of exercise intolerance    [id number]  ETT--  normal w/ no ischemia per cardiologist (dr Aundra Dubin) epic note  . History of pulmonary embolus (PE)    07/ 2008-- treated w/ coumadin  . History of syncope    recurrent --  workup done per epic -- dx orthostatic hypotension  . Hypertension   . IBS (irritable bowel syndrome)   . Pelvic pain in female   . 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 Proteinuria   . Seasonal and perennial allergic rhinitis   . Wears contact lenses     Past Surgical History:  Procedure Laterality Date  . ABDOMINAL HYSTERECTOMY  1998  . CYSTOSCOPY N/A 04/28/2016   Procedure: CYSTOSCOPY;  Surgeon: Arvella Nigh, MD;  Location: [facility];  Service: Gynecology;  Laterality: N/A;  . ESOPHAGOGASTRODUODENOSCOPY    . LAPAROSCOPIC CHOLECYSTECTOMY  1997  . LAPAROSCOPY N/A 03/26/2016   Procedure: LAPAROSCOPY DIAGNOSTIC, LYSIS OF ADHESIONS;  Surgeon: Arvella Nigh, MD;  Location: [place];  Service: Gynecology;  Laterality: N/A;  . LAPAROTOMY N/A 04/28/2016   Procedure: EXPLORATORY LAPAROTOMY;  Surgeon: Arvella Nigh, MD;  Location: [facility];  Service: Gynecology;  Laterality: N/A;  . RENAL BIOPSY    . SALPINGOOPHORECTOMY Bilateral 04/28/2016   Procedure: Bilateral  OOPHORECTOMY;  Surgeon: Arvella Nigh, MD;  Location: [facility];  Service: Gynecology;  Laterality: Bilateral;  . THYROIDECTOMY Left 1991  . TRANSTHORACIC ECHOCARDIOGRAM  04/27/2012   normal LV, ef 60-65%/  trivial MR  . TUBAL LIGATION  1992    Current Outpatient Prescriptions  Medication Sig Dispense Refill  . acyclovir cream (ZOVIRAX) 5 % Apply 1 application topically 3 (three) times daily. 15 g 1  . albuterol (PROVENTIL HFA;VENTOLIN HFA) 108 (90 BASE) MCG/ACT inhaler Inhale 2 puffs into the lungs every 6 (six) hours as needed for wheezing or shortness of breath. 1 Inhaler 5  . ALPRAZolam (XANAX) 0.25 MG tablet Take 1 tablet (0.25 mg total) by mouth 2 (two) times daily as needed for anxiety. 60 tablet 2  . alum & mag hydroxide-simeth (MAALOX/MYLANTA) 200-200-20 MG/5ML suspension Take 30 mLs by mouth every 6 (six) hours as needed for indigestion or heartburn.     Marland Kitchen amLODipine (NORVASC) 5 MG tablet TAKE 1 TABLET BY MOUTH EVERY DAY (Patient taking differently: TAKE  5 MG BY MOUTH EVERY MORNING) 30 tablet 5  . docusate sodium (COLACE) 100 MG capsule Take 100 mg by mouth every morning.     Marland Kitchen doxycycline (VIBRAMYCIN) 100 MG capsule Take 1 capsule (100 mg total) by mouth 2 (two) times daily. 20 capsule 0  . levothyroxine (SYNTHROID, LEVOTHROID) 150 MCG tablet TAKE 1 TABLET BY MOUTH EVERY DAY BEFORE BREAKFAST (Patient taking differently: TAKE 150 MCG BY MOUTH EVERY DAY BEFORE BREAKFAST) 30 tablet 5  . loratadine (CLARITIN) 10 MG tablet Take 10 mg by mouth every morning.     . metoprolol succinate (TOPROL-XL) 50 MG 24 hr tablet TAKE 1 TABLET BY MOUTH EVERY DAY IMMEDIATELY FOLLOWING A MEAL (Patient taking differently: TAKE 50 MG BY MOUTH EVERY EVENING IMMEDIATELY FOLLOWING A MEAL) 30 tablet 5  . Multiple Vitamin (MULTIVITAMIN) tablet Take 1 tablet by mouth every morning.     Marland Kitchen omeprazole (PRILOSEC) 40 MG capsule TAKE 1 CAPSULE(40 MG) BY MOUTH DAILY 30 capsule 5  . promethazine (PHENERGAN) 25 MG tablet Take 1  tablet (25 mg total) by mouth every 6 (six) hours as needed for nausea or vomiting. 30 tablet 2  . valsartan (DIOVAN) 320 MG tablet Take 320 mg by mouth every morning.   5  . amitriptyline (ELAVIL) 10 MG tablet Take 1 tablet (10 mg total) by mouth at bedtime. 30 tablet 6  . SUMAtriptan (IMITREX) 100 MG tablet Take 1 tablet (100 mg total) by mouth once as needed. May repeat in 2 hours if headache persists or recurs. No more than 2x in one day. 10 tablet 12   No current facility-administered medications for this visit.     Allergies as of 08/25/2016 - Review Complete 08/25/2016  Allergen Reaction Noted  . Aspirin Hives   . Flagyl [metronidazole] Nausea And Vomiting 06/20/2013  . Food Other (See Comments) 10/02/2011  . Tramadol Other (See Comments) 10/02/2011  . Dilaudid [hydromorphone] Itching 07/01/2014    Vitals: BP (!) 137/95   Pulse 76   Resp 20   Ht 5' 5.5" (1.664 m)   Wt 207 lb (93.9 kg)   BMI 33.92 kg/m  Last Weight:  Wt Readings from Last 1 Encounters:  08/25/16 207 lb (93.9 kg)   Last Height:   Ht Readings from Last 1 Encounters:  08/25/16 5' 5.5" (1.664 m)   Physical exam: Exam: Gen: NAD, conversant, well nourised, obese, well groomed                     CV: RRR, no MRG. No Carotid Bruits. No peripheral edema, warm, nontender Eyes: Conjunctivae clear without exudates or hemorrhage  Neuro: Detailed Neurologic Exam  Speech:    Speech is normal; fluent and spontaneous with normal comprehension.  Cognition:    The patient is oriented to person, place, and time;     recent and remote memory intact;     language fluent;     normal attention, concentration,     fund of knowledge Cranial Nerves:    The pupils are equal, round, and reactive to light. The fundi are normal and spontaneous venous pulsations are present. Visual fields are full to finger confrontation. Extraocular movements are intact. Trigeminal sensation is intact and the muscles of mastication are  normal. The face is symmetric. The palate elevates in the midline. Hearing intact. Voice is normal. Shoulder shrug is normal. The tongue has normal motion without fasciculations.   Coordination:    Normal finger to nose and heel to shin.  Normal rapid alternating movements.   Gait:    Heel-toe and tandem gait are normal.   Motor Observation:    No asymmetry, no atrophy, and no involuntary movements noted. Tone:    Normal muscle tone.    Posture:    Posture is normal. normal erect    Strength:    Strength is V/V in the upper and lower limbs.      Sensation: intact to LT     Reflex Exam:  DTR's:    Deep tendon reflexes in the upper and lower extremities are normal bilaterally.   Toes:    The toes are downgoing bilaterally.   Clonus:    Clonus is absent.       Assessment/Plan:  46 year old patient with occipital, throbbing headaches after having hysterectomy and bilateral salpingo-oophorectomy. Discussed estrogen replacement therapy with patient but she is hesitant. Will start Amitriptyline which may help with her insomnia as well.   Patient with occipital headaches and musculoskeletal neck pain may be a component of cervicalgia: [facility] Occipital headache: Amitriptyline at bedtime, Imitrex for acute management.  Discussed: To prevent or relieve headaches, try the following: Cool Compress. Lie down and place a cool compress on your head.  Avoid headache triggers. If certain foods or odors seem to have triggered your migraines in the past, avoid them. A headache diary might help you identify triggers.  Include physical activity in your daily routine. Try a daily walk or other moderate aerobic exercise.  Manage stress. Find healthy ways to cope with the stressors, such as delegating tasks on your to-do list.  Practice relaxation techniques. Try deep breathing, yoga, massage and visualization.  Eat regularly. Eating regularly scheduled meals and maintaining a  healthy diet might help prevent headaches. Also, drink plenty of fluids.  Follow a regular sleep schedule. Sleep deprivation might contribute to headaches Consider biofeedback. With this mind-body technique, you learn to control certain bodily functions - such as muscle tension, heart rate and blood pressure - to prevent headaches or reduce headache pain.    Proceed to emergency room if you experience new or worsening symptoms or symptoms do not resolve, if you have new neurologic symptoms or if headache is severe, or for any concerning symptom.   Cc: Arvella Nigh MD  Sarina Ill, MD  [facility] [address] [place], [place] [address]  Phone [phone] Fax [phone]

## 2016-08-25 NOTE — Patient Instructions (Addendum)
Remember to drink plenty of fluid, eat healthy meals and do not skip any meals. Try to eat protein with a every meal and eat a healthy snack such as fruit or nuts in between meals. Try to keep a regular sleep-wake schedule and try to exercise daily, particularly in the form of walking, 20-30 minutes a day, if you can.   As far as your medications are concerned, I would like to suggest: Amitriptyline 10mg  at bedtime  I would like to see you back in 3 months, sooner if we need to. Please call us with any interim questions, concerns, problems, updates or refill requests.   Our phone number is [phone]. We also have an after hours call service for urgent matters and there is a physician on-call for urgent questions. For any emergencies you know to call 911 or go to the nearest emergency room  Amitriptyline tablets What is this medicine? AMITRIPTYLINE (a mee TRIP ti leen) is used to treat migraine, headache and pain. This medicine may be used for other purposes; ask your health care provider or pharmacist if you have questions. COMMON BRAND NAME(S): Elavil, Vanatrip What should I tell my health care provider before I take this medicine? They need to know if you have any of these conditions: -an alcohol problem -asthma, difficulty breathing -bipolar disorder or schizophrenia -difficulty passing urine, prostate trouble -glaucoma -heart disease or previous heart attack -liver disease -over active thyroid -seizures -thoughts or plans of suicide, a previous suicide attempt, or family history of suicide attempt -an unusual or allergic reaction to amitriptyline, other medicines, foods, dyes, or preservatives -pregnant or trying to get pregnant -breast-feeding How should I use this medicine? Take this medicine by mouth with a drink of water. Follow the directions on the prescription label. You can take the tablets with or without food. Take your medicine at regular intervals. Do not take it more  often than directed. Do not stop taking this medicine suddenly except upon the advice of your doctor. Stopping this medicine too quickly may cause serious side effects or your condition may worsen. A special MedGuide will be given to you by the pharmacist with each prescription and refill. Be sure to read this information carefully each time. Talk to your pediatrician regarding the use of this medicine in children. Special care may be needed. Overdosage: If you think you have taken too much of this medicine contact a poison control center or emergency room at once. NOTE: This medicine is only for you. Do not share this medicine with others. What if I miss a dose? If you miss a dose, take it as soon as you can. If it is almost time for your next dose, take only that dose. Do not take double or extra doses. What may interact with this medicine? Do not take this medicine with any of the following medications: -arsenic trioxide -certain medicines used to regulate abnormal heartbeat or to treat other heart conditions -cisapride -droperidol -halofantrine -linezolid -MAOIs like Carbex, Eldepryl, Marplan, Nardil, and Parnate -methylene blue -other medicines for mental depression -phenothiazines like perphenazine, thioridazine and chlorpromazine -pimozide -probucol -procarbazine -sparfloxacin -St. John's Wort -ziprasidone This medicine may also interact with the following medications: -atropine and related drugs like hyoscyamine, scopolamine, tolterodine and others -barbiturate medicines for inducing sleep or treating seizures, like phenobarbital -cimetidine -disulfiram -ethchlorvynol -thyroid hormones such as levothyroxine This list may not describe all possible interactions. Give your health care provider a list of all the medicines, herbs, non-prescription drugs, or dietary  supplements you use. Also tell them if you smoke, drink alcohol, or use illegal drugs. Some items may interact with  your medicine. What should I watch for while using this medicine? Tell your doctor if your symptoms do not get better or if they get worse. Visit your doctor or health care professional for regular checks on your progress. Because it may take several weeks to see the full effects of this medicine, it is important to continue your treatment as prescribed by your doctor. Patients and their families should watch out for new or worsening thoughts of suicide or depression. Also watch out for sudden changes in feelings such as feeling anxious, agitated, panicky, irritable, hostile, aggressive, impulsive, severely restless, overly excited and hyperactive, or not being able to sleep. If this happens, especially at the beginning of treatment or after a change in dose, call your health care professional. Dennis Bast may get drowsy or dizzy. Do not drive, use machinery, or do anything that needs mental alertness until you know how this medicine affects you. Do not stand or sit up quickly, especially if you are an older patient. This reduces the risk of dizzy or fainting spells. Alcohol may interfere with the effect of this medicine. Avoid alcoholic drinks. Do not treat yourself for coughs, colds, or allergies without asking your doctor or health care professional for advice. Some ingredients can increase possible side effects. Your mouth may get dry. Chewing sugarless gum or sucking hard candy, and drinking plenty of water will help. Contact your doctor if the problem does not go away or is severe. This medicine may cause dry eyes and blurred vision. If you wear contact lenses you may feel some discomfort. Lubricating drops may help. See your eye doctor if the problem does not go away or is severe. This medicine can cause constipation. Try to have a bowel movement at least every 2 to 3 days. If you do not have a bowel movement for 3 days, call your doctor or health care professional. This medicine can make you more sensitive  to the sun. Keep out of the sun. If you cannot avoid being in the sun, wear protective clothing and use sunscreen. Do not use sun lamps or tanning beds/booths. What side effects may I notice from receiving this medicine? Side effects that you should report to your doctor or health care professional as soon as possible: -allergic reactions like skin rash, itching or hives, swelling of the face, lips, or tongue -anxious -breathing problems -changes in vision -confusion -elevated mood, decreased need for sleep, racing thoughts, impulsive behavior -eye pain -fast, irregular heartbeat -feeling faint or lightheaded, falls -feeling agitated, angry, or irritable -fever with increased sweating -hallucination, loss of contact with reality -seizures -stiff muscles -suicidal thoughts or other mood changes -tingling, pain, or numbness in the feet or hands -trouble passing urine or change in the amount of urine -trouble sleeping -unusually weak or tired -vomiting -yellowing of the eyes or skin Side effects that usually do not require medical attention (report to your doctor or health care professional if they continue or are bothersome): -change in sex drive or performance -change in appetite or weight -constipation -dizziness -dry mouth -nausea -tired -tremors -upset stomach This list may not describe all possible side effects. Call your doctor for medical advice about side effects. You may report side effects to FDA at 1-800-FDA-1088. Where should I keep my medicine? Keep out of the reach of children. Store at room temperature between 20 and 25 degrees C (  68 and 77 degrees F). Throw away any unused medicine after the expiration date. NOTE: This sheet is a summary. It may not cover all possible information. If you have questions about this medicine, talk to your doctor, pharmacist, or health care provider.  2017 Elsevier/Gold Standard (2016-01-03 12:14:15)   Sumatriptan tablets What is  this medicine? SUMATRIPTAN (soo ma TRIP tan) is used to treat migraines with or without aura. An aura is a strange feeling or visual disturbance that warns you of an attack. It is not used to prevent migraines. This medicine may be used for other purposes; ask your health care provider or pharmacist if you have questions. COMMON BRAND NAME(S): Imitrex, Migraine Pack What should I tell my health care provider before I take this medicine? They need to know if you have any of these conditions: -circulation problems in fingers and toes -diabetes -heart disease -high blood pressure -high cholesterol -history of irregular heartbeat -history of stroke -kidney disease -liver disease -postmenopausal or surgical removal of uterus and ovaries -seizures -smoke tobacco -stomach or intestine problems -an unusual or allergic reaction to sumatriptan, other medicines, foods, dyes, or preservatives -pregnant or trying to get pregnant -breast-feeding How should I use this medicine? Take this medicine by mouth with a glass of water. Follow the directions on the prescription label. This medicine is taken at the first symptoms of a migraine. It is not for everyday use. If your migraine headache returns after one dose, you can take another dose as directed. You must leave at least 2 hours between doses, and do not take more than 100 mg as a single dose. Do not take more than 200 mg total in any 24 hour period. If there is no improvement at all after the first dose, do not take a second dose without talking to your doctor or health care professional. Do not take your medicine more often than directed. Talk to your pediatrician regarding the use of this medicine in children. Special care may be needed. Overdosage: If you think you have taken too much of this medicine contact a poison control center or emergency room at once. NOTE: This medicine is only for you. Do not share this medicine with others. What if I  miss a dose? This does not apply; this medicine is not for regular use. What may interact with this medicine? Do not take this medicine with any of the following medicines: -cocaine -ergot alkaloids like dihydroergotamine, ergonovine, ergotamine, methylergonovine -feverfew -MAOIs like Carbex, Eldepryl, Marplan, Nardil, and Parnate -other medicines for migraine headache like almotriptan, eletriptan, frovatriptan, naratriptan, rizatriptan, zolmitriptan -tryptophan This medicine may also interact with the following medications: -certain medicines for depression, anxiety, or psychotic disturbances This list may not describe all possible interactions. Give your health care provider a list of all the medicines, herbs, non-prescription drugs, or dietary supplements you use. Also tell them if you smoke, drink alcohol, or use illegal drugs. Some items may interact with your medicine. What should I watch for while using this medicine? Only take this medicine for a migraine headache. Take it if you get warning symptoms or at the start of a migraine attack. It is not for regular use to prevent migraine attacks. You may get drowsy or dizzy. Do not drive, use machinery, or do anything that needs mental alertness until you know how this medicine affects you. To reduce dizzy or fainting spells, do not sit or stand up quickly, especially if you are an older patient. Alcohol can increase drowsiness,  dizziness and flushing. Avoid alcoholic drinks. Smoking cigarettes may increase the risk of heart-related side effects from using this medicine. If you take migraine medicines for 10 or more days a month, your migraines may get worse. Keep a diary of headache days and medicine use. Contact your healthcare professional if your migraine attacks occur more frequently. What side effects may I notice from receiving this medicine? Side effects that you should report to your doctor or health care professional as soon as  possible: -allergic reactions like skin rash, itching or hives, swelling of the face, lips, or tongue -bloody or watery diarrhea -hallucination, loss of contact with reality -pain, tingling, numbness in the face, hands, or feet -seizures -signs and symptoms of a blood clot such as breathing problems; changes in vision; chest pain; severe, sudden headache; pain, swelling, warmth in the leg; trouble speaking; sudden numbness or weakness of the face, arm, or leg -signs and symptoms of a dangerous change in heartbeat or heart rhythm like chest pain; dizziness; fast or irregular heartbeat; palpitations, feeling faint or lightheaded; falls; breathing problems -signs and symptoms of a stroke like changes in vision; confusion; trouble speaking or understanding; severe headaches; sudden numbness or weakness of the face, arm, or leg; trouble walking; dizziness; loss of balance or coordination -stomach pain Side effects that usually do not require medical attention (report to your doctor or health care professional if they continue or are bothersome): -changes in taste -facial flushing -headache -muscle cramps -muscle pain -nausea, vomiting -weak or tired This list may not describe all possible side effects. Call your doctor for medical advice about side effects. You may report side effects to FDA at 1-800-FDA-1088. Where should I keep my medicine? Keep out of the reach of children. Store at room temperature between 2 and 30 degrees C (36 and 86 degrees F). Throw away any unused medicine after the expiration date. NOTE: This sheet is a summary. It may not cover all possible information. If you have questions about this medicine, talk to your doctor, pharmacist, or health care provider.  2017 Elsevier/Gold Standard (2015-09-05 12:38:23)

## 2016-09-21 ENCOUNTER — Other Ambulatory Visit: Payer: Self-pay | Admitting: Orthopedic Surgery

## 2016-09-21 DIAGNOSIS — M25512 Pain in left shoulder: Secondary | ICD-10-CM

## 2016-09-22 ENCOUNTER — Telehealth: Payer: Self-pay | Admitting: Internal Medicine

## 2016-09-22 MED ORDER — PROMETHAZINE HCL 25 MG PO TABS: 25.0000 mg | ORAL_TABLET | Freq: Four times a day (QID) | ORAL | 0 refills | Status: DC | PRN

## 2016-09-22 NOTE — Telephone Encounter (Signed)
Pt would like to have some phenergan called in due to her having the stomach bug pt refused appointment.  Pharm:  [facility]

## 2016-09-22 NOTE — Telephone Encounter (Signed)
Please advise 

## 2016-09-22 NOTE — Telephone Encounter (Signed)
Pt made aware, Rx sent to pharmacy. Pt verbalized understanding.

## 2016-09-22 NOTE — Telephone Encounter (Signed)
Generic Phenergan 25 mg #30 one every 6 hours as needed for nausea

## 2016-09-30 ENCOUNTER — Inpatient Hospital Stay
Admission: RE | Admit: 2016-09-30 | Discharge: 2016-09-30 | Disposition: A | Discharge status: Home / Self Care | Payer: Self-pay | Source: Ambulatory Visit | Attending: Orthopedic Surgery | Admitting: Orthopedic Surgery

## 2016-10-01 ENCOUNTER — Telehealth: Payer: Self-pay | Admitting: Neurology

## 2016-10-01 NOTE — Telephone Encounter (Signed)
Called pt and she says that she was not able to tolerate amitriptyline due to feeling that she was in a daze and tired when taking it. Has not tried triptan as she's not had a severe HA, only sensations of temp changes in the back of her head. She's also had some numbness/tingling in her L arm which her orthopedist felt is b/c of her neck. Says that x-rays were negative but she was unable to have cervical MRI d/t out-of-pocket expense. She is aware that Dr. Jaynee Eagles is out of the office until Monday. WIll use triptan prn and call back on Monday w/ update on symptoms.

## 2016-10-01 NOTE — Telephone Encounter (Signed)
Patient called office in reference to cold/hot sensations in the back of her head.  Problem has been on going since November, but feels they are getting more intense.  Please call

## 2016-10-01 NOTE — Telephone Encounter (Signed)
New pt w/ occiptal HAs was seen for OV on 08/25/16 after which she was referred to [facility]. Amitriptyline and sumatriptan prn were also ordered.

## 2016-10-02 NOTE — Telephone Encounter (Signed)
LVM giving patient details of Dr Cathren Laine message. Requested she call back before noon today if she would like Dr Jaynee Eagles to prescribe a different medication. Left office number.

## 2016-10-02 NOTE — Telephone Encounter (Signed)
If she feels the Amitriptyline has been helping with headaches but she is having too many side effects wwe can switch to nortriptyline in stead, a similar drug with less sedation but will still help her sleep. otherwise I may try Topiramate with her. Please discuss. thanks

## 2016-10-05 ENCOUNTER — Ambulatory Visit (INDEPENDENT_AMBULATORY_CARE_PROVIDER_SITE_OTHER): Payer: BLUE CROSS/BLUE SHIELD | Admitting: Internal Medicine

## 2016-10-05 ENCOUNTER — Encounter: Payer: Self-pay | Admitting: Internal Medicine

## 2016-10-05 VITALS — BP 128/72 | HR 68 | Temp 98.4°F | Ht 65.5 in | Wt 206.0 lb

## 2016-10-05 DIAGNOSIS — N183 Chronic kidney disease, stage 3 unspecified: Secondary | ICD-10-CM

## 2016-10-05 DIAGNOSIS — E034 Atrophy of thyroid (acquired): Secondary | ICD-10-CM

## 2016-10-05 DIAGNOSIS — I10 Essential (primary) hypertension: Secondary | ICD-10-CM

## 2016-10-05 NOTE — Progress Notes (Signed)
Pre visit review using our clinic review tool, if applicable. No additional management support is needed unless otherwise documented below in the visit note. 

## 2016-10-05 NOTE — Progress Notes (Signed)
Subjective:    Patient ID: Rhonda [name], female    DOB: 1971-02-15, 46 y.o.   MRN: [id number]  HPI  46 year old patient who has a history of essential hypertension.  She is seen today for her six-month follow-up.  She is followed by nephrology due to chronic kidney disease.  She is also followed by neurology due to headaches.  In general doing recently well today.  Past Medical History:  Diagnosis Date  . Anxiety   . C1q nephropathy   . Cancer [facility])    history of thyroid cancer  . Chronic constipation   . CKD (chronic kidney disease), stage II nephrologist-  dr Gretchen Short [facility])   secondary to C1q nephropathy/  hypertension  . Family history of adverse reaction to anesthesia    father hard to put down for anesthesia  . GERD (gastroesophageal reflux disease)   . H/O radioactive iodine thyroid ablation    1992  right throid  . History of exercise intolerance    01-052011  ETT--  normal w/ no ischemia per cardiologist (dr Aundra Dubin) epic note  . History of pulmonary embolus (PE)    07/ 2008-- treated w/ coumadin  . History of syncope    recurrent --  workup done per epic -- dx orthostatic hypotension  . Hypertension   . IBS (irritable bowel syndrome)   . Pelvic pain in female   . 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 Proteinuria   . Seasonal and perennial allergic rhinitis   . Wears contact lenses      Social History   Social History  . Marital status: Single    Spouse name: N/A  . Number of children: 2  . Years of education: N/A   Occupational History  . [place]   Social History Main Topics  . Smoking status: Never Smoker  . Smokeless tobacco: Never Used  . Alcohol use No  . Drug use: No  . Sexual activity: Yes    Birth control/ protection: Surgical   Other Topics Concern  . Not on file   Social History Narrative  . No narrative on file    Past Surgical History:  Procedure  Laterality Date  . ABDOMINAL HYSTERECTOMY  1998  . CYSTOSCOPY N/A 04/28/2016   Procedure: CYSTOSCOPY;  Surgeon: Arvella Nigh, MD;  Location: [facility];  Service: Gynecology;  Laterality: N/A;  . ESOPHAGOGASTRODUODENOSCOPY    . LAPAROSCOPIC CHOLECYSTECTOMY  1997  . LAPAROSCOPY N/A 03/26/2016   Procedure: LAPAROSCOPY DIAGNOSTIC, LYSIS OF ADHESIONS;  Surgeon: Arvella Nigh, MD;  Location: [facility];  Service: Gynecology;  Laterality: N/A;  . LAPAROTOMY N/A 04/28/2016   Procedure: EXPLORATORY LAPAROTOMY;  Surgeon: Arvella Nigh, MD;  Location: [facility];  Service: Gynecology;  Laterality: N/A;  . RENAL BIOPSY    . SALPINGOOPHORECTOMY Bilateral 04/28/2016   Procedure: Bilateral OOPHORECTOMY;  Surgeon: Arvella Nigh, MD;  Location: [facility];  Service: Gynecology;  Laterality: Bilateral;  . THYROIDECTOMY Left 1991  . TRANSTHORACIC ECHOCARDIOGRAM  04/27/2012   normal LV, ef 60-65%/  trivial MR  . TUBAL LIGATION  1992    Family History  Problem Relation Age of Onset  . Hypertension Father   . Coronary artery disease Father   . Diabetes Father   . Stroke Father   . Prostate cancer Father   . Diabetes Mother     Pre-diabetic  . Hypertension Mother   . Colon cancer Neg Hx   . Colon  polyps Neg Hx   . Kidney disease Neg Hx   . Esophageal cancer Neg Hx   . Gallbladder disease Neg Hx     Allergies  Allergen Reactions  . Aspirin Hives  . Flagyl [Metronidazole] Nausea And Vomiting    With oral flagyl.   Has tolerated vaginal flagyl.    . Food Other (See Comments)    Lima beans--  Positive allergy test   . Tramadol Other (See Comments)    Headache   . Dilaudid [Hydromorphone] Itching    Current Outpatient Prescriptions on File Prior to Visit  Medication Sig Dispense Refill  . acyclovir cream (ZOVIRAX) 5 % Apply 1 application topically 3 (three) times daily. 15 g 1  . albuterol (PROVENTIL HFA;VENTOLIN HFA) 108 (90 BASE) MCG/ACT inhaler Inhale 2 puffs into the lungs every 6 (six) hours as  needed for wheezing or shortness of breath. 1 Inhaler 5  . ALPRAZolam (XANAX) 0.25 MG tablet Take 1 tablet (0.25 mg total) by mouth 2 (two) times daily as needed for anxiety. 60 tablet 2  . alum & mag hydroxide-simeth (MAALOX/MYLANTA) 200-200-20 MG/5ML suspension Take 30 mLs by mouth every 6 (six) hours as needed for indigestion or heartburn.     Marland Kitchen amitriptyline (ELAVIL) 10 MG tablet Take 1 tablet (10 mg total) by mouth at bedtime. 30 tablet 6  . amLODipine (NORVASC) 5 MG tablet TAKE 1 TABLET BY MOUTH EVERY DAY (Patient taking differently: TAKE 5 MG BY MOUTH EVERY MORNING) 30 tablet 5  . docusate sodium (COLACE) 100 MG capsule Take 100 mg by mouth every morning.     Marland Kitchen doxycycline (VIBRAMYCIN) 100 MG capsule Take 1 capsule (100 mg total) by mouth 2 (two) times daily. 20 capsule 0  . levothyroxine (SYNTHROID, LEVOTHROID) 150 MCG tablet TAKE 1 TABLET BY MOUTH EVERY DAY BEFORE BREAKFAST (Patient taking differently: TAKE 150 MCG BY MOUTH EVERY DAY BEFORE BREAKFAST) 30 tablet 5  . loratadine (CLARITIN) 10 MG tablet Take 10 mg by mouth every morning.     . metoprolol succinate (TOPROL-XL) 50 MG 24 hr tablet TAKE 1 TABLET BY MOUTH EVERY DAY IMMEDIATELY FOLLOWING A MEAL (Patient taking differently: TAKE 50 MG BY MOUTH EVERY EVENING IMMEDIATELY FOLLOWING A MEAL) 30 tablet 5  . Multiple Vitamin (MULTIVITAMIN) tablet Take 1 tablet by mouth every morning.     Marland Kitchen omeprazole (PRILOSEC) 40 MG capsule TAKE 1 CAPSULE(40 MG) BY MOUTH DAILY 30 capsule 5  . promethazine (PHENERGAN) 25 MG tablet Take 1 tablet (25 mg total) by mouth every 6 (six) hours as needed for nausea or vomiting. 30 tablet 2  . promethazine (PHENERGAN) 25 MG tablet Take 1 tablet (25 mg total) by mouth every 6 (six) hours as needed for nausea or vomiting. 30 tablet 0  . SUMAtriptan (IMITREX) 100 MG tablet Take 1 tablet (100 mg total) by mouth once as needed. May repeat in 2 hours if headache persists or recurs. No more than 2x in one day. 10 tablet 12    . valsartan (DIOVAN) 320 MG tablet Take 320 mg by mouth every morning.   5   No current facility-administered medications on file prior to visit.     BP 128/72 (BP Location: Left Arm, Patient Position: Sitting, Cuff Size: Normal)   Pulse 68   Temp 98.4 F (36.9 C) (Oral)   Ht 5' 5.5" (1.664 m)   Wt 206 lb (93.4 kg)   SpO2 98%   BMI 33.76 kg/m     Review of Systems  Constitutional: Negative.   HENT: Negative for congestion, dental problem, hearing loss, rhinorrhea, sinus pressure, sore throat and tinnitus.   Eyes: Negative for pain, discharge and visual disturbance.  Respiratory: Negative for cough and shortness of breath.   Cardiovascular: Negative for chest pain, palpitations and leg swelling.  Gastrointestinal: Negative for abdominal distention, abdominal pain, blood in stool, constipation, diarrhea, nausea and vomiting.  Genitourinary: Negative for difficulty urinating, dysuria, flank pain, frequency, hematuria, pelvic pain, urgency, vaginal bleeding, vaginal discharge and vaginal pain.  Musculoskeletal: Negative for arthralgias, gait problem and joint swelling.  Skin: Negative for rash.  Neurological: Positive for headaches. Negative for dizziness, syncope, speech difficulty, weakness and numbness.  Hematological: Negative for adenopathy.  Psychiatric/Behavioral: Negative for agitation, behavioral problems and dysphoric mood. The patient is not nervous/anxious.        Objective:   Physical Exam  Constitutional: She is oriented to person, place, and time. She appears well-developed and well-nourished.  HENT:  Head: Normocephalic.  Right Ear: External ear normal.  Left Ear: External ear normal.  Mouth/Throat: Oropharynx is clear and moist.  Eyes: Conjunctivae and EOM are normal. Pupils are equal, round, and reactive to light.  Neck: Normal range of motion. Neck supple. No thyromegaly present.  Cardiovascular: Normal rate, regular rhythm, normal heart sounds and intact  distal pulses.   Pulmonary/Chest: Effort normal and breath sounds normal.  Abdominal: Soft. Bowel sounds are normal. She exhibits no mass. There is no tenderness.  Musculoskeletal: Normal range of motion.  Lymphadenopathy:    She has no cervical adenopathy.  Neurological: She is alert and oriented to person, place, and time.  Skin: Skin is warm and dry. No rash noted.  Psychiatric: She has a normal mood and affect. Her behavior is normal.          Assessment & Plan:   Essential hypertension, stable Headache syndrome. Amitriptyline discussed.  Patient agreeable to give herself a trial  Follow-up 6 months Neurology follow-up Nephrology follow-up  Nyoka Cowden

## 2016-10-05 NOTE — Patient Instructions (Signed)
Limit your sodium (Salt) intake  Please check your blood pressure on a regular basis.  If it is consistently greater than 150/90, please make an office appointment.     It is important that you exercise regularly, at least 20 minutes 3 to 4 times per week.  If you develop chest pain or shortness of breath seek  medical attention. 

## 2016-10-15 ENCOUNTER — Encounter: Payer: Self-pay | Admitting: Gastroenterology

## 2016-10-15 ENCOUNTER — Ambulatory Visit (INDEPENDENT_AMBULATORY_CARE_PROVIDER_SITE_OTHER): Payer: BLUE CROSS/BLUE SHIELD | Admitting: Gastroenterology

## 2016-10-15 VITALS — BP 130/94 | HR 60 | Ht 65.75 in | Wt 203.4 lb

## 2016-10-15 DIAGNOSIS — K219 Gastro-esophageal reflux disease without esophagitis: Secondary | ICD-10-CM

## 2016-10-15 DIAGNOSIS — R1033 Periumbilical pain: Secondary | ICD-10-CM | POA: Diagnosis not present

## 2016-10-15 DIAGNOSIS — K5909 Other constipation: Secondary | ICD-10-CM

## 2016-10-15 MED ORDER — HYOSCYAMINE SULFATE 0.125 MG SL SUBL: 0.1250 mg | SUBLINGUAL_TABLET | Freq: Three times a day (TID) | SUBLINGUAL | 1 refills | Status: DC | PRN

## 2016-10-15 NOTE — Progress Notes (Signed)
[facility] Progress Note  Chief Complaint: GERD and altered bowel habits with abdominal pain  Subjective  History:  See my June 2017 note for details. Rhonda [name] is here for follow-up of her both upper and lower GI symptoms. There behaving much the same as before, with frequent heartburn and an uncomfortable feeling as if "everything is sitting in my chest". It does not sound like there is frank dysphagia, she feels an ill-defined fullness in the chest after eating. She feels as if she has to constantly clear her throat and sleeps sitting up to control the heartburn. As before, she still tends toward a bowel movement about every other day but says it is not hard and does not need to strain. However, after bowel movements she will have mid abdominal cramping that will last perhaps 6-8 hours. There is no anal or rectal pain with bowel movements and no rectal bleeding. She had no improvement from a trial of Carafate and samples of low-dose Linzess which I gave her in June. ROS: Cardiovascular:  no chest pain Respiratory: no dyspnea  The patient's Past Medical, Family and Social History were reviewed and are on file in the EMR.  Objective:  Med list reviewed  Vital signs in last 24 hrs: Vitals:   10/15/16 1059  BP: (!) 130/94  Pulse: 60    Physical Exam    HEENT: sclera anicteric, oral mucosa moist without lesions  Neck: supple, no thyromegaly, JVD or lymphadenopathy  Cardiac: RRR without murmurs, S1S2 heard, no peripheral edema  Pulm: clear to auscultation bilaterally, normal RR and effort noted  Abdomen: soft, Overweight, no tenderness, with active bowel sounds. No guarding or palpable hepatosplenomegaly.  Skin; warm and dry, no jaundice or rash    @ASSESSMENTPLANBEGIN @ Assessment: Encounter Diagnoses  Name Primary?  . Gastroesophageal reflux disease without esophagitis Yes  . Other constipation   . Periumbilical abdominal cramping    I believe there is probably a  significant element of visceral hypersensitivity exacerbating her heartburn. 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  Plan:  Trial of hyoscyamine for the abdominal cramps EGD to reassess upper GI tract. After that, very likely to need pH testing off PPI therapy to quantify her reflux and see if she is a candidate for fundoplication. I explained to her how patients with reportedly severe and frequent symptoms but a low level of identifiable reflux are not good surgical candidates. Low-dose TCA can be helpful for that, she has taken that at times for her headaches but no longer does so.  She is agreeable to an EGD after discussion of the procedure and risks.  The benefits and risks of the planned procedure were described in detail with the patient or (when appropriate) their health care proxy.  Risks were outlined as including, but not limited to, bleeding, infection, perforation, adverse medication reaction leading to cardiac or pulmonary decompensation, or pancreatitis (if ERCP).  The limitation of incomplete mucosal visualization was also discussed.  No guarantees or warranties were given.   Total 25inutes, over half spent in counseling and coordination of care.   Rhonda [name]

## 2016-10-15 NOTE — Patient Instructions (Signed)
If you are age 48 or older, your body mass index should be between 23-30. Your Body mass index is 33.08 kg/m. If this is out of the aforementioned range listed, please consider follow up with your Primary Care Provider.  If you are age 87 or younger, your body mass index should be between 19-25. Your Body mass index is 33.08 kg/m. If this is out of the aformentioned range listed, please consider follow up with your Primary Care Provider.   You have been scheduled for an endoscopy. Please follow written instructions given to you at your visit today. If you use inhalers (even only as needed), please bring them with you on the day of your procedure. Your physician has requested that you go to www.startemmi.com and enter the access code given to you at your visit today. This web site gives a general overview about your procedure. However, you should still follow specific instructions given to you by our office regarding your preparation for the procedure.  Thank you for choosing [facility]  Dr Wilfrid Lund III

## 2016-10-23 ENCOUNTER — Ambulatory Visit (AMBULATORY_SURGERY_CENTER): Payer: BLUE CROSS/BLUE SHIELD | Admitting: Gastroenterology

## 2016-10-23 ENCOUNTER — Encounter: Payer: Self-pay | Admitting: Gastroenterology

## 2016-10-23 VITALS — BP 129/82 | HR 70 | Temp 98.2°F | Resp 17 | Ht 65.0 in | Wt 203.0 lb

## 2016-10-23 DIAGNOSIS — K219 Gastro-esophageal reflux disease without esophagitis: Secondary | ICD-10-CM

## 2016-10-23 DIAGNOSIS — R12 Heartburn: Secondary | ICD-10-CM | POA: Diagnosis not present

## 2016-10-23 MED ORDER — SODIUM CHLORIDE 0.9 % IV SOLN: 500.0000 mL | INTRAVENOUS | Status: DC

## 2016-10-23 NOTE — Progress Notes (Signed)
Patient awakening,vss,report to rn 

## 2016-10-23 NOTE — Patient Instructions (Signed)
Impression/Recommendations:  Continue Omeprazole 40 mg.in the morning, and add 20 mg. Omeprazole before supper meal.  Perform ambulatory pH and impedance monitoring at the next available appointment.  YOU HAD AN ENDOSCOPIC PROCEDURE TODAY AT [facility]:   Refer to the procedure report that was given to you for any specific questions about what was found during the examination.  If the procedure report does not answer your questions, please call your gastroenterologist to clarify.  If you requested that your care partner not be given the details of your procedure findings, then the procedure report has been included in a sealed envelope for you to review at your convenience later.  YOU SHOULD EXPECT: Some feelings of bloating in the abdomen. Passage of more gas than usual.  Walking can help get rid of the air that was put into your GI tract during the procedure and reduce the bloating. If you had a lower endoscopy (such as a colonoscopy or flexible sigmoidoscopy) you may notice spotting of blood in your stool or on the toilet paper. If you underwent a bowel prep for your procedure, you may not have a normal bowel movement for a few days.  Please Note:  You might notice some irritation and congestion in your nose or some drainage.  This is from the oxygen used during your procedure.  There is no need for concern and it should clear up in a day or so.  SYMPTOMS TO REPORT IMMEDIATELY:  Following upper endoscopy (EGD)  Vomiting of blood or coffee ground material  New chest pain or pain under the shoulder blades  Painful or persistently difficult swallowing  New shortness of breath  Fever of 100F or higher  Black, tarry-looking stools  For urgent or emergent issues, a gastroenterologist can be reached at any hour by calling [phone].   DIET:  We do recommend a small meal at first, but then you may proceed to your regular diet.  Drink plenty of fluids but you should avoid  alcoholic beverages for 24 hours.  ACTIVITY:  You should plan to take it easy for the rest of today and you should NOT DRIVE or use heavy machinery until tomorrow (because of the sedation medicines used during the test).    FOLLOW UP: Our staff will call the number listed on your records the next business day following your procedure to check on you and address any questions or concerns that you may have regarding the information given to you following your procedure. If we do not reach you, we will leave a message.  However, if you are feeling well and you are not experiencing any problems, there is no need to return our call.  We will assume that you have returned to your regular daily activities without incident.  If any biopsies were taken you will be contacted by phone or by letter within the next 1-3 weeks.  Please call us at [phone] if you have not heard about the biopsies in 3 weeks.    SIGNATURES/CONFIDENTIALITY: You and/or your care partner have signed paperwork which will be entered into your electronic medical record.  These signatures attest to the fact that that the information above on your After Visit Summary has been reviewed and is understood.  Full responsibility of the confidentiality of this discharge information lies with you and/or your care-partner.

## 2016-10-23 NOTE — Op Note (Signed)
Rhonda [name] Patient Name: Rhonda [name] Procedure Date: 10/23/2016 7:54 AM MRN: [id number] Endoscopist: Mallie Mussel L. Loletha Carrow , MD Age: 46 Referring MD:  Date of Birth: 01/04/1971 Gender: Female Account #: [id number] Procedure:                Upper GI endoscopy Indications:              Heartburn, Esophageal reflux symptoms that persist                            despite appropriate therapy Medicines:                Monitored Anesthesia Care Procedure:                Pre-Anesthesia Assessment:                           - Prior to the procedure, a History and Physical                            was performed, and patient medications and                            allergies were reviewed. The patient's tolerance of                            previous anesthesia was also reviewed. The risks                            and benefits of the procedure and the sedation                            options and risks were discussed with the patient.                            All questions were answered, and informed consent                            was obtained. Prior Anticoagulants: The patient has                            taken no previous anticoagulant or antiplatelet                            agents. ASA Grade Assessment: II - A patient with                            mild systemic disease. After reviewing the risks                            and benefits, the patient was deemed in                            satisfactory condition to undergo the procedure.  After obtaining informed consent, the endoscope was                            passed under direct vision. Throughout the                            procedure, the patient's blood pressure, pulse, and                            oxygen saturations were monitored continuously. The                            Model GIF-HQ190 [id number]) scope was introduced                            through the mouth, and  advanced to the second part                            of duodenum. The upper GI endoscopy was                            accomplished without difficulty. The patient                            tolerated the procedure well. Scope In: Scope Out: Findings:                 The larynx was normal.                           The esophagus was normal.                           A few sessile fundic gland polyps were found in the                            stomach.                           The cardia and gastric fundus were normal on                            retroflexion.                           The examined duodenum was normal. Complications:            No immediate complications. Estimated Blood Loss:     Estimated blood loss: none. Impression:               - Normal larynx.                           - Normal esophagus.                           - A few fundic gland polyps. Benign finding related  to chronic use of PPI medicine.                           - Normal examined duodenum.                           - No specimens collected. Recommendation:           - Patient has a contact number available for                            emergencies. The signs and symptoms of potential                            delayed complications were discussed with the                            patient. Return to normal activities tomorrow.                            Written discharge instructions were provided to the                            patient.                           - Resume previous diet.                           - Continue present medications. In addition to the                            current AM dose of omeprazole 40 mg, add over the                            counter omeprazole 20 mg before supper meal.                           - Perform ambulatory pH and impedance monitoring at                            the next available appointment. Perform this study                             while patient is on acid suppression therapy                            recommended above. Evertt Chouinard L. Loletha Carrow, MD 10/23/2016 8:18:25 AM This report has been signed electronically.

## 2016-10-23 NOTE — Progress Notes (Signed)
No change in her health history since her visit with Dr. Loletha Carrow on 10/15/2016. SM

## 2016-10-26 ENCOUNTER — Telehealth: Payer: Self-pay | Admitting: *Deleted

## 2016-10-26 NOTE — Telephone Encounter (Signed)
Could not reach patient at number left - marie mccraw rn

## 2016-10-27 ENCOUNTER — Telehealth: Payer: Self-pay

## 2016-10-27 NOTE — Telephone Encounter (Signed)
  Follow up Call-  Call back number 10/23/2016  Post procedure Call Back phone  # [phone]  Permission to leave phone message Yes  Some recent data might be hidden    Patient was called for follow up after her procedure on 10/23/16. No answer at the number given for follow up phone call. A message was left on voice mail.

## 2016-11-04 ENCOUNTER — Other Ambulatory Visit: Payer: Self-pay

## 2016-11-16 ENCOUNTER — Other Ambulatory Visit: Payer: Self-pay | Admitting: Internal Medicine

## 2016-11-16 ENCOUNTER — Ambulatory Visit (HOSPITAL_COMMUNITY)
Admission: RE | Admit: 2016-11-16 | Discharge: 2016-11-16 | Disposition: A | Discharge status: Home / Self Care | Payer: BLUE CROSS/BLUE SHIELD | Source: Ambulatory Visit | Attending: Gastroenterology | Admitting: Gastroenterology

## 2016-11-16 ENCOUNTER — Encounter (HOSPITAL_COMMUNITY)
Admission: RE | Disposition: A | Discharge status: Home / Self Care | Payer: Self-pay | Source: Ambulatory Visit | Attending: Gastroenterology

## 2016-11-16 DIAGNOSIS — K219 Gastro-esophageal reflux disease without esophagitis: Secondary | ICD-10-CM | POA: Insufficient documentation

## 2016-11-16 DIAGNOSIS — Z1231 Encounter for screening mammogram for malignant neoplasm of breast: Secondary | ICD-10-CM

## 2016-11-16 HISTORY — PX: 24 HOUR PH STUDY: SHX5419

## 2016-11-16 HISTORY — PX: ESOPHAGEAL MANOMETRY: SHX5429

## 2016-11-16 SURGERY — MANOMETRY, ESOPHAGUS
Anesthesia: Topical

## 2016-11-16 MED ORDER — LIDOCAINE VISCOUS 2 % MT SOLN
OROMUCOSAL | Status: AC
  Filled 2016-11-16: qty 15

## 2016-11-16 SURGICAL SUPPLY — 2 items
FACESHIELD LNG OPTICON STERILE (SAFETY) IMPLANT
GLOVE BIO SURGEON STRL SZ8 (GLOVE) ×4 IMPLANT

## 2016-11-16 NOTE — Progress Notes (Signed)
Esophageal Manometry done per protocol . Pt tolerated well without distress.  PH probe with impedance probe inserted per protocol placed at 35.2cm. Pt tolerated well. No complications noted. Went over [facility] monitor and study instructions with patient using teach back. Pt voiced understanding. Pt will come back at or after 0955 tomorrow 11/17/2016 to have probe removed and monitor downloaded.

## 2016-11-18 ENCOUNTER — Encounter (HOSPITAL_COMMUNITY): Payer: Self-pay | Admitting: Gastroenterology

## 2016-11-24 ENCOUNTER — Ambulatory Visit: Payer: BLUE CROSS/BLUE SHIELD | Admitting: Neurology

## 2016-11-27 ENCOUNTER — Ambulatory Visit (INDEPENDENT_AMBULATORY_CARE_PROVIDER_SITE_OTHER): Payer: BLUE CROSS/BLUE SHIELD | Admitting: Internal Medicine

## 2016-11-27 ENCOUNTER — Encounter: Payer: Self-pay | Admitting: Internal Medicine

## 2016-11-27 VITALS — BP 124/82 | HR 73 | Temp 98.2°F | Ht 65.0 in | Wt 208.0 lb

## 2016-11-27 DIAGNOSIS — J301 Allergic rhinitis due to pollen: Secondary | ICD-10-CM

## 2016-11-27 DIAGNOSIS — N289 Disorder of kidney and ureter, unspecified: Secondary | ICD-10-CM

## 2016-11-27 DIAGNOSIS — I10 Essential (primary) hypertension: Secondary | ICD-10-CM | POA: Diagnosis not present

## 2016-11-27 DIAGNOSIS — R300 Dysuria: Secondary | ICD-10-CM | POA: Diagnosis not present

## 2016-11-27 LAB — POC URINALSYSI DIPSTICK (AUTOMATED)
BILIRUBIN UA: NEGATIVE
GLUCOSE UA: NEGATIVE
KETONES UA: NEGATIVE
Leukocytes, UA: NEGATIVE
NITRITE UA: NEGATIVE
RBC UA: NEGATIVE
SPEC GRAV UA: 1.025 (ref 1.010–1.025)
Urobilinogen, UA: 0.2 E.U./dL
pH, UA: 6 (ref 5.0–8.0)

## 2016-11-27 MED ORDER — PREDNISONE 20 MG PO TABS: 20.0000 mg | ORAL_TABLET | Freq: Two times a day (BID) | ORAL | 0 refills | Status: DC

## 2016-11-27 MED ORDER — PROMETHAZINE HCL 25 MG PO TABS: 25.0000 mg | ORAL_TABLET | Freq: Four times a day (QID) | ORAL | 0 refills | Status: DC | PRN

## 2016-11-27 NOTE — Progress Notes (Signed)
Subjective:    Patient ID: Rhonda [name], female    DOB: 1971-01-06, 46 y.o.   MRN: [id number]  HPI  46 year old patient who presents with a number of concerns.  She complains of some urinary frequency, as well as lower abdominal and Lower back pain. She has a history of allergic rhinitis.  She complains of increasing sinus congestion, drainage and cough. She has been using daily, Zyrtec.  No albuterol use  Past Medical History:  Diagnosis Date  . Anxiety   . C1q nephropathy   . Cancer [facility])    history of thyroid cancer  . Chronic constipation   . CKD (chronic kidney disease), stage II nephrologist-  dr Gretchen Short [facility])   secondary to C1q nephropathy/  hypertension  . Family history of adverse reaction to anesthesia    father hard to put down for anesthesia  . GERD (gastroesophageal reflux disease)   . H/O radioactive iodine thyroid ablation    1992  right throid  . History of exercise intolerance    [id number]  ETT--  normal w/ no ischemia per cardiologist (dr Aundra Dubin) epic note  . History of pulmonary embolus (PE)    07/ 2008-- treated w/ coumadin  . History of syncope    recurrent --  workup done per epic -- dx orthostatic hypotension  . Hypertension   . IBS (irritable bowel syndrome)   . Pelvic pain in female   . 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 Proteinuria   . Seasonal and perennial allergic rhinitis   . Wears contact lenses      Social History   Social History  . Marital status: Single    Spouse name: N/A  . Number of children: 2  . Years of education: N/A   Occupational History  . [employer]   Social History Main Topics  . Smoking status: Never Smoker  . Smokeless tobacco: Never Used  . Alcohol use No  . Drug use: No  . Sexual activity: Yes    Birth control/ protection: Surgical   Other Topics Concern  . Not on file   Social History Narrative  . No narrative on  file    Past Surgical History:  Procedure Laterality Date  . [place] STUDY N/A 11/16/2016   Procedure: [place] STUDY;  Surgeon: Doran Stabler, MD;  Location: [facility];  Service: Gastroenterology;  Laterality: N/A;  . ABDOMINAL HYSTERECTOMY  1998  . CYSTOSCOPY N/A 04/28/2016   Procedure: CYSTOSCOPY;  Surgeon: Arvella Nigh, MD;  Location: [facility];  Service: Gynecology;  Laterality: N/A;  . ESOPHAGEAL MANOMETRY N/A 11/16/2016   Procedure: ESOPHAGEAL MANOMETRY (EM);  Surgeon: Doran Stabler, MD;  Location: [facility];  Service: Gastroenterology;  Laterality: N/A;  . ESOPHAGOGASTRODUODENOSCOPY    . LAPAROSCOPIC CHOLECYSTECTOMY  1997  . LAPAROSCOPY N/A 03/26/2016   Procedure: LAPAROSCOPY DIAGNOSTIC, LYSIS OF ADHESIONS;  Surgeon: Arvella Nigh, MD;  Location: [facility];  Service: Gynecology;  Laterality: N/A;  . LAPAROTOMY N/A 04/28/2016   Procedure: EXPLORATORY LAPAROTOMY;  Surgeon: Arvella Nigh, MD;  Location: [facility];  Service: Gynecology;  Laterality: N/A;  . RENAL BIOPSY    . SALPINGOOPHORECTOMY Bilateral 04/28/2016   Procedure: Bilateral OOPHORECTOMY;  Surgeon: Arvella Nigh, MD;  Location: [facility];  Service: Gynecology;  Laterality: Bilateral;  . THYROIDECTOMY Left 1991  . TRANSTHORACIC ECHOCARDIOGRAM  04/27/2012   normal LV, ef 60-65%/  trivial MR  .  TUBAL LIGATION  1992    Family History  Problem Relation Age of Onset  . Hypertension Father   . Coronary artery disease Father   . Diabetes Father   . Stroke Father   . Prostate cancer Father   . Diabetes Mother     Pre-diabetic  . Hypertension Mother   . Colon cancer Neg Hx   . Colon polyps Neg Hx   . Kidney disease Neg Hx   . Esophageal cancer Neg Hx   . Gallbladder disease Neg Hx     Allergies  Allergen Reactions  . Aspirin Hives  . Flagyl [Metronidazole] Nausea And Vomiting    With oral flagyl.   Has tolerated vaginal flagyl.    . Food Other (See Comments)    Lima beans--  Positive allergy test     . Tramadol Other (See Comments)    Headache   . Dilaudid [Hydromorphone] Itching    Current Outpatient Prescriptions on File Prior to Visit  Medication Sig Dispense Refill  . acyclovir cream (ZOVIRAX) 5 % Apply 1 application topically 3 (three) times daily. 15 g 1  . albuterol (PROVENTIL HFA;VENTOLIN HFA) 108 (90 BASE) MCG/ACT inhaler Inhale 2 puffs into the lungs every 6 (six) hours as needed for wheezing or shortness of breath. 1 Inhaler 5  . ALPRAZolam (XANAX) 0.25 MG tablet Take 1 tablet (0.25 mg total) by mouth 2 (two) times daily as needed for anxiety. 60 tablet 2  . alum & mag hydroxide-simeth (MAALOX/MYLANTA) 200-200-20 MG/5ML suspension Take 30 mLs by mouth every 6 (six) hours as needed for indigestion or heartburn.     Marland Kitchen amitriptyline (ELAVIL) 10 MG tablet Take 1 tablet (10 mg total) by mouth at bedtime. (Patient not taking: Reported on 10/23/2016) 30 tablet 6  . amLODipine (NORVASC) 5 MG tablet TAKE 1 TABLET BY MOUTH EVERY DAY 30 tablet 5  . docusate sodium (COLACE) 100 MG capsule Take 100 mg by mouth every morning.     . hyoscyamine (LEVSIN SL) 0.125 MG SL tablet Place 1 tablet (0.125 mg total) under the tongue every 8 (eight) hours as needed. (Patient not taking: Reported on 10/23/2016) 30 tablet 1  . levothyroxine (SYNTHROID, LEVOTHROID) 150 MCG tablet TAKE 1 TABLET BY MOUTH EVERY DAY BEFORE BREAKFAST 30 tablet 5  . loratadine (CLARITIN) 10 MG tablet Take 10 mg by mouth every morning.     . metoprolol succinate (TOPROL-XL) 50 MG 24 hr tablet TAKE 1 TABLET BY MOUTH EVERY DAY IMMEDIATELY FOLLOWING A MEAL 30 tablet 5  . Multiple Vitamin (MULTIVITAMIN) tablet Take 1 tablet by mouth every morning.     Marland Kitchen omeprazole (PRILOSEC) 40 MG capsule TAKE 1 CAPSULE(40 MG) BY MOUTH DAILY 30 capsule 5  . promethazine (PHENERGAN) 25 MG tablet Take 1 tablet (25 mg total) by mouth every 6 (six) hours as needed for nausea or vomiting. 30 tablet 0  . SUMAtriptan (IMITREX) 100 MG tablet Take 1 tablet (100 mg  total) by mouth once as needed. May repeat in 2 hours if headache persists or recurs. No more than 2x in one day. 10 tablet 12  . valsartan (DIOVAN) 320 MG tablet Take 320 mg by mouth every morning.   5   Current Facility-Administered Medications on File Prior to Visit  Medication Dose Route Frequency Provider Last Rate Last Dose  . 0.9 %  sodium chloride infusion  500 mL Intravenous Continuous Nelida Meuse III, MD        BP 124/82 (BP Location: Left  Arm, Patient Position: Sitting, Cuff Size: Normal)   Pulse 73   Temp 98.2 F (36.8 C) (Oral)   Ht 5\' 5"  (1.651 m)   Wt 208 lb (94.3 kg)   SpO2 96%   BMI 34.61 kg/m      Review of Systems  Constitutional: Positive for activity change, appetite change and fatigue.  HENT: Positive for congestion, postnasal drip and rhinorrhea. Negative for dental problem, hearing loss, sinus pressure, sore throat and tinnitus.   Eyes: Negative for pain, discharge and visual disturbance.  Respiratory: Positive for cough. Negative for shortness of breath.   Cardiovascular: Negative for chest pain, palpitations and leg swelling.  Gastrointestinal: Positive for abdominal pain. Negative for abdominal distention, blood in stool, constipation, diarrhea, nausea and vomiting.  Genitourinary: Positive for frequency. Negative for difficulty urinating, dysuria, flank pain, hematuria, pelvic pain, urgency, vaginal bleeding, vaginal discharge and vaginal pain.  Musculoskeletal: Positive for back pain. Negative for arthralgias, gait problem and joint swelling.  Skin: Negative for rash.  Neurological: Negative for dizziness, syncope, speech difficulty, weakness, numbness and headaches.  Hematological: Negative for adenopathy.  Psychiatric/Behavioral: Negative for agitation, behavioral problems and dysphoric mood. The patient is not nervous/anxious.        Objective:   Physical Exam  Constitutional: She is oriented to person, place, and time. She appears  well-developed and well-nourished.  HENT:  Head: Normocephalic.  Right Ear: External ear normal.  Left Ear: External ear normal.  Mouth/Throat: Oropharynx is clear and moist.  Eyes: Conjunctivae and EOM are normal. Pupils are equal, round, and reactive to light.  Neck: Normal range of motion. Neck supple. No thyromegaly present.  Cardiovascular: Normal rate, regular rhythm, normal heart sounds and intact distal pulses.   Pulmonary/Chest: Effort normal and breath sounds normal.  Abdominal: Soft. Bowel sounds are normal. She exhibits no mass. There is no tenderness.  Musculoskeletal: Normal range of motion.  Lymphadenopathy:    She has no cervical adenopathy.  Neurological: She is alert and oriented to person, place, and time.  Skin: Skin is warm and dry. No rash noted.  Psychiatric: She has a normal mood and affect. Her behavior is normal.          Assessment & Plan:   Essential hypertension, well- controlled. Allergic rhinitis flare.  Will treat with short burst of oral prednisone.

## 2016-11-27 NOTE — Patient Instructions (Addendum)
Limit your sodium (Salt) intake  Please check your blood pressure on a regular basis.  If it is consistently greater than 150/90, please make an office appointment.  Hydrate and Humidify  Drink enough water to keep your urine clear or pale yellow. Staying hydrated will help to thin your mucus.  Use a cool mist humidifier to keep the humidity level in your home above 50%.  Inhale steam for 10-15 minutes, 3-4 times a day or as told by your health care provider. You can do this in the bathroom while a hot shower is running.  Limit your exposure to cool or dry air. Rest  Rest as much as possible.  

## 2016-11-27 NOTE — Progress Notes (Signed)
Pre visit review using our clinic review tool, if applicable. No additional management support is needed unless otherwise documented below in the visit note. 

## 2016-11-30 ENCOUNTER — Telehealth: Payer: Self-pay | Admitting: Internal Medicine

## 2016-11-30 MED ORDER — ALPRAZOLAM 0.25 MG PO TABS: 0.2500 mg | ORAL_TABLET | Freq: Two times a day (BID) | ORAL | 0 refills | Status: DC | PRN

## 2016-11-30 NOTE — Telephone Encounter (Signed)
Okay to refill? 

## 2016-11-30 NOTE — Telephone Encounter (Signed)
Okay for refill?  

## 2016-11-30 NOTE — Telephone Encounter (Signed)
Pt need new Rx for alprazolam  Pharm [facility] [address] (not sure if they have lights if they do not pt would like for it to be called in to [facility] [address])  Pt would like to have a call as to which one it was called in to.

## 2016-11-30 NOTE — Telephone Encounter (Signed)
Rx phoned in. Left voicemail for patient letting her know it was called into the [facility] on [address].

## 2016-12-01 ENCOUNTER — Telehealth: Payer: Self-pay | Admitting: Internal Medicine

## 2016-12-01 NOTE — Telephone Encounter (Signed)
° ° ° °  [facility] [address] is closed do to [employer].they advised pt to contact us and have the rx sent   ALPRAZolam Duanne Moron) 0.25 MG tablet   [facility] [address] at [address]

## 2016-12-02 ENCOUNTER — Ambulatory Visit
Admission: RE | Admit: 2016-12-02 | Discharge: 2016-12-02 | Disposition: A | Discharge status: Home / Self Care | Payer: BLUE CROSS/BLUE SHIELD | Source: Ambulatory Visit | Attending: Internal Medicine | Admitting: Internal Medicine

## 2016-12-02 DIAGNOSIS — Z1231 Encounter for screening mammogram for malignant neoplasm of breast: Secondary | ICD-10-CM

## 2016-12-03 NOTE — Telephone Encounter (Signed)
Please disregard the refill request, this Rx was refilled on 11/30/16

## 2016-12-08 ENCOUNTER — Other Ambulatory Visit: Payer: Self-pay | Admitting: Internal Medicine

## 2016-12-18 ENCOUNTER — Telehealth: Payer: Self-pay | Admitting: Internal Medicine

## 2016-12-18 MED ORDER — ALBUTEROL SULFATE HFA 108 (90 BASE) MCG/ACT IN AERS: 2.0000 | INHALATION_SPRAY | Freq: Four times a day (QID) | RESPIRATORY_TRACT | 5 refills | Status: DC | PRN

## 2016-12-18 NOTE — Telephone Encounter (Signed)
Pt request refill  albuterol (PROVENTIL HFA;VENTOLIN HFA) 108 (90 BASE) MCG/ACT inhaler  [id number] - [place], [place] - [address] AT [address]

## 2016-12-18 NOTE — Telephone Encounter (Signed)
Medication refilled

## 2016-12-27 ENCOUNTER — Encounter (HOSPITAL_COMMUNITY): Payer: Self-pay

## 2016-12-27 ENCOUNTER — Emergency Department (HOSPITAL_COMMUNITY)
Admission: EM | Admit: 2016-12-27 | Discharge: 2016-12-27 | Disposition: A | Discharge status: Home / Self Care | Payer: BLUE CROSS/BLUE SHIELD | Attending: Emergency Medicine | Admitting: Emergency Medicine

## 2016-12-27 DIAGNOSIS — Z85850 Personal history of malignant neoplasm of thyroid: Secondary | ICD-10-CM | POA: Diagnosis not present

## 2016-12-27 DIAGNOSIS — I129 Hypertensive chronic kidney disease with stage 1 through stage 4 chronic kidney disease, or unspecified chronic kidney disease: Secondary | ICD-10-CM | POA: Insufficient documentation

## 2016-12-27 DIAGNOSIS — Z79899 Other long term (current) drug therapy: Secondary | ICD-10-CM | POA: Diagnosis not present

## 2016-12-27 DIAGNOSIS — K644 Residual hemorrhoidal skin tags: Secondary | ICD-10-CM | POA: Diagnosis not present

## 2016-12-27 DIAGNOSIS — N182 Chronic kidney disease, stage 2 (mild): Secondary | ICD-10-CM | POA: Insufficient documentation

## 2016-12-27 DIAGNOSIS — E039 Hypothyroidism, unspecified: Secondary | ICD-10-CM | POA: Insufficient documentation

## 2016-12-27 DIAGNOSIS — K6289 Other specified diseases of anus and rectum: Secondary | ICD-10-CM | POA: Diagnosis present

## 2016-12-27 MED ORDER — HYDROCODONE-ACETAMINOPHEN 5-325 MG PO TABS: 2.0000 | ORAL_TABLET | ORAL | 0 refills | Status: DC | PRN

## 2016-12-27 MED ORDER — DIBUCAINE 1 % RE OINT: 1.0000 "application " | TOPICAL_OINTMENT | RECTAL | 0 refills | Status: DC | PRN

## 2016-12-27 MED ORDER — HYDROCORTISONE ACETATE 25 MG RE SUPP: 25.0000 mg | Freq: Two times a day (BID) | RECTAL | 0 refills | Status: DC

## 2016-12-27 MED ORDER — PSYLLIUM 0.52 G PO CAPS: 0.5200 g | ORAL_CAPSULE | Freq: Every day | ORAL | 0 refills | Status: DC

## 2016-12-27 NOTE — ED Provider Notes (Signed)
[facility] DEPT Provider Note   CSN: [id number] Arrival date & time: 12/27/16  5427     History   Chief Complaint Chief Complaint  Patient presents with  . Rectal Pain    HPI Rhonda [name] is a 46 y.o. female.  HPI  Pt presenting with c/o rectal pain.  She has noted a hemorrhoid over the past several days that is swollen and causing her a lot of pain.  She has tried using preparation H without much relief.  No rectal bleeding.  She is passing bowel movements but they are painful.  She has had hemorrhoids in the past but it has been many years.  There are no other associated systemic symptoms, there are no other alleviating or modifying factors.  Pain is worse with sitting and with passing BMs.    Past Medical History:  Diagnosis Date  . Anxiety   . C1q nephropathy   . Cancer [facility])    history of thyroid cancer  . Chronic constipation   . CKD (chronic kidney disease), stage II nephrologist-  dr Gretchen Short [facility])   secondary to C1q nephropathy/  hypertension  . Family history of adverse reaction to anesthesia    father hard to put down for anesthesia  . GERD (gastroesophageal reflux disease)   . H/O radioactive iodine thyroid ablation    1992  right throid  . History of exercise intolerance    [id number]  ETT--  normal w/ no ischemia per cardiologist (dr Aundra Dubin) epic note  . History of pulmonary embolus (PE)    07/ 2008-- treated w/ coumadin  . History of syncope    recurrent --  workup done per epic -- dx orthostatic hypotension  . Hypertension   . IBS (irritable bowel syndrome)   . Pelvic pain in female   . 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 Proteinuria   . Seasonal and perennial allergic rhinitis   . Wears contact lenses     Patient Active Problem List   Diagnosis Date Noted  . S/P bilateral salpingo-oophorectomy 04/28/2016  . Pelvic adhesions 03/26/2016    Class: Present on Admission  . History of  pulmonary embolism 08/01/2014  . Hypothyroidism 04/28/2012  . Hemorrhage of rectum and anus 02/09/2011  . Constipation 02/09/2011  . Dyslipidemia 08/26/2009  . BACK PAIN 02/14/2008  . Allergic rhinitis 11/15/2007  . GERD 06/07/2007  . ANXIETY STATE NOS 04/27/2007  . Essential hypertension 04/27/2007  . Disorder of kidney and ureter 03/17/2007  . EMBOLISM/THROMBOSIS, DEEP VSL PRXML LWR EXTRM 03/16/2007    Past Surgical History:  Procedure Laterality Date  . [place] STUDY N/A 11/16/2016   Procedure: [place] STUDY;  Surgeon: Doran Stabler, MD;  Location: [facility];  Service: Gastroenterology;  Laterality: N/A;  . ABDOMINAL HYSTERECTOMY  1998  . CYSTOSCOPY N/A 04/28/2016   Procedure: CYSTOSCOPY;  Surgeon: Arvella Nigh, MD;  Location: [facility];  Service: Gynecology;  Laterality: N/A;  . ESOPHAGEAL MANOMETRY N/A 11/16/2016   Procedure: ESOPHAGEAL MANOMETRY (EM);  Surgeon: Doran Stabler, MD;  Location: [facility];  Service: Gastroenterology;  Laterality: N/A;  . ESOPHAGOGASTRODUODENOSCOPY    . LAPAROSCOPIC CHOLECYSTECTOMY  1997  . LAPAROSCOPY N/A 03/26/2016   Procedure: LAPAROSCOPY DIAGNOSTIC, LYSIS OF ADHESIONS;  Surgeon: Arvella Nigh, MD;  Location: [place];  Service: Gynecology;  Laterality: N/A;  . LAPAROTOMY N/A 04/28/2016   Procedure: EXPLORATORY LAPAROTOMY;  Surgeon: Arvella Nigh, MD;  Location: [facility];  Service: Gynecology;  Laterality: N/A;  . RENAL BIOPSY    . SALPINGOOPHORECTOMY Bilateral 04/28/2016   Procedure: Bilateral OOPHORECTOMY;  Surgeon: Arvella Nigh, MD;  Location: [facility];  Service: Gynecology;  Laterality: Bilateral;  . THYROIDECTOMY Left 1991  . TRANSTHORACIC ECHOCARDIOGRAM  04/27/2012   normal LV, ef 60-65%/  trivial MR  . TUBAL LIGATION  1992    OB History    No data available       Home Medications    Prior to Admission medications   Medication Sig Start Date End Date Taking? Authorizing Provider  acyclovir cream (ZOVIRAX) 5 %  Apply 1 application topically 3 (three) times daily. 07/15/15   Marletta Lor, MD  albuterol (PROVENTIL HFA;VENTOLIN HFA) 108 (90 Base) MCG/ACT inhaler Inhale 2 puffs into the lungs every 6 (six) hours as needed for wheezing or shortness of breath. 12/18/16   Marletta Lor, MD  ALPRAZolam Duanne Moron) 0.25 MG tablet Take 1 tablet (0.25 mg total) by mouth 2 (two) times daily as needed for anxiety. 11/30/16   Marletta Lor, MD  alum & mag hydroxide-simeth (MAALOX/MYLANTA) 200-200-20 MG/5ML suspension Take 30 mLs by mouth every 6 (six) hours as needed for indigestion or heartburn.     [provider]  amitriptyline (ELAVIL) 10 MG tablet Take 1 tablet (10 mg total) by mouth at bedtime. Patient not taking: Reported on 10/23/2016 08/25/16   Melvenia Beam, MD  amLODipine (NORVASC) 5 MG tablet TAKE 1 TABLET BY MOUTH EVERY DAY 07/04/15   Marletta Lor, MD  dibucaine (NUPERCAINAL) 1 % OINT Place 1 application rectally as needed for pain. 12/27/16   Alfonzo Beers, MD  docusate sodium (COLACE) 100 MG capsule Take 100 mg by mouth every morning.     [provider]  HYDROcodone-acetaminophen (NORCO/VICODIN) 5-325 MG tablet Take 2 tablets by mouth every 4 (four) hours as needed. 12/27/16   Alfonzo Beers, MD  hydrocortisone (ANUSOL-HC) 25 MG suppository Place 1 suppository (25 mg total) rectally 2 (two) times daily. 12/27/16   Alfonzo Beers, MD  hyoscyamine (LEVSIN SL) 0.125 MG SL tablet Place 1 tablet (0.125 mg total) under the tongue every 8 (eight) hours as needed. Patient not taking: Reported on 10/23/2016 10/15/16   Doran Stabler, MD  levothyroxine (SYNTHROID, LEVOTHROID) 150 MCG tablet TAKE 1 TABLET BY MOUTH EVERY DAY BEFORE BREAKFAST 07/04/15   Marletta Lor, MD  levothyroxine (SYNTHROID, LEVOTHROID) 150 MCG tablet TAKE 1 TABLET BY MOUTH EVERY DAY BEFORE BREAKFAST 12/08/16   Marletta Lor, MD  loratadine (CLARITIN) 10 MG tablet Take 10 mg by mouth every  morning.     [provider]  metoprolol succinate (TOPROL-XL) 50 MG 24 hr tablet TAKE 1 TABLET BY MOUTH EVERY DAY IMMEDIATELY FOLLOWING A MEAL 07/04/15   Marletta Lor, MD  metoprolol succinate (TOPROL-XL) 50 MG 24 hr tablet TAKE 1 TABLET BY MOUTH EVERY DAY IMMEDIATELY FOLLOWING A MEAL 12/08/16   Marletta Lor, MD  Multiple Vitamin (MULTIVITAMIN) tablet Take 1 tablet by mouth every morning.     [provider]  omeprazole (PRILOSEC) 40 MG capsule TAKE 1 CAPSULE(40 MG) BY MOUTH DAILY 06/25/16   Marletta Lor, MD  predniSONE (DELTASONE) 20 MG tablet Take 1 tablet (20 mg total) by mouth 2 (two) times daily with a meal. 11/27/16   Marletta Lor, MD  promethazine (PHENERGAN) 25 MG tablet Take 1 tablet (25 mg total) by mouth every 6 (six) hours as needed for  nausea or vomiting. 11/27/16   Marletta Lor, MD  psyllium (METAMUCIL) 0.52 g capsule Take 1 capsule (0.52 g total) by mouth daily. 12/27/16   Alfonzo Beers, MD  SUMAtriptan (IMITREX) 100 MG tablet Take 1 tablet (100 mg total) by mouth once as needed. May repeat in 2 hours if headache persists or recurs. No more than 2x in one day. 08/25/16   Melvenia Beam, MD  valsartan (DIOVAN) 320 MG tablet Take 320 mg by mouth every morning.  10/17/15   [provider]    Family History Family History  Problem Relation Age of Onset  . Hypertension Father   . Coronary artery disease Father   . Diabetes Father   . Stroke Father   . Prostate cancer Father   . Diabetes Mother        Pre-diabetic  . Hypertension Mother   . Breast cancer Maternal Aunt   . Colon cancer Neg Hx   . Colon polyps Neg Hx   . Kidney disease Neg Hx   . Esophageal cancer Neg Hx   . Gallbladder disease Neg Hx     Social History Social History  Substance Use Topics  . Smoking status: Never Smoker  . Smokeless tobacco: Never Used  . Alcohol use No     Allergies   Aspirin; Flagyl [metronidazole]; Food; Tramadol; and  Dilaudid [hydromorphone]   Review of Systems Review of Systems  ROS reviewed and all otherwise negative except for mentioned in HPI   Physical Exam Updated Vital Signs BP (!) 134/100 (BP Location: Left Arm)   Pulse 70   Temp 98 F (36.7 C) (Oral)   Resp 18   SpO2 99%  Vitals reviewed Physical Exam Physical Examination: General appearance - alert, well appearing, and in no distress Mental status - alert, oriented to person, place, and time Eyes - no conjunctival injection, no scleral icterus Chest - clear to auscultation, no wheezes, rales or rhonchi, symmetric air entry Heart - normal rate, regular rhythm, normal S1, S2, no murmurs, rubs, clicks or gallops Rectal - approx 2cm external hemorrhoid, tender, soft, not thrombosed, no active bleeding, normal rectal tone Neurological - alert, oriented, normal speech, no focal findings or movement disorder noted Extremities - peripheral pulses normal, no pedal edema, no clubbing or cyanosis Skin - normal coloration and turgor, no rashes  ED Treatments / Results  Labs (all labs ordered are listed, but only abnormal results are displayed) Labs Reviewed - No data to display  EKG  EKG Interpretation None       Radiology No results found.  Procedures Procedures (including critical care time)  Medications Ordered in ED Medications - No data to display   Initial Impression / Assessment and Plan / ED Course  I have reviewed the triage vital signs and the nursing notes.  Pertinent labs & imaging results that were available during my care of the patient were reviewed by me and considered in my medical decision making (see chart for details).     Pt presenting with painful external hemorrhoid. On exam this is not thrombosed.  Pt given rx for dibucaine, hydrocortisone, metamucil and pain control.  Given referral for surgery if conservative measures don't improve her symptoms.  Discharged with strict return precautions.  Pt  agreeable with plan.  Final Clinical Impressions(s) / ED Diagnoses   Final diagnoses:  External hemorrhoid    New Prescriptions Discharge Medication List as of 12/27/2016  9:44 AM    START taking these medications  Details  dibucaine (NUPERCAINAL) 1 % OINT Place 1 application rectally as needed for pain., Starting Sun 12/27/2016, Print    HYDROcodone-acetaminophen (NORCO/VICODIN) 5-325 MG tablet Take 2 tablets by mouth every 4 (four) hours as needed., Starting Sun 12/27/2016, Print    hydrocortisone (ANUSOL-HC) 25 MG suppository Place 1 suppository (25 mg total) rectally 2 (two) times daily., Starting Sun 12/27/2016, Print    psyllium (METAMUCIL) 0.52 g capsule Take 1 capsule (0.52 g total) by mouth daily., Starting Sun 12/27/2016, Print         Alfonzo Beers, MD 12/29/16 1047

## 2016-12-27 NOTE — Discharge Instructions (Signed)
Return to the ED with any concerns including rectal bleeding, worsening pain, fever/chills, or any other alarming symptoms

## 2016-12-27 NOTE — ED Triage Notes (Signed)
Pt c/o rectal pain r/t hemorrhoids x 3 days.  Pain score 8/10.  Pt reports trying OTC treatment w/o relief.  Denies bleeding.

## 2016-12-27 NOTE — ED Notes (Signed)
ED Provider at bedside. 

## 2016-12-29 ENCOUNTER — Ambulatory Visit: Payer: BLUE CROSS/BLUE SHIELD | Admitting: Gastroenterology

## 2016-12-31 ENCOUNTER — Ambulatory Visit (INDEPENDENT_AMBULATORY_CARE_PROVIDER_SITE_OTHER): Payer: BLUE CROSS/BLUE SHIELD | Admitting: Family Medicine

## 2016-12-31 VITALS — BP 128/88 | HR 78 | Temp 98.7°F | Wt 206.0 lb

## 2016-12-31 DIAGNOSIS — K644 Residual hemorrhoidal skin tags: Secondary | ICD-10-CM

## 2016-12-31 MED ORDER — HYDROCORTISONE 2.5 % RE CREA: 1.0000 "application " | TOPICAL_CREAM | Freq: Two times a day (BID) | RECTAL | 0 refills | Status: DC

## 2016-12-31 NOTE — Patient Instructions (Addendum)
It was a pleasure to see you today! Please follow up with your provider if symptoms do not improve with treatment, worsen, or you develop bleeding.   Hemorrhoids Hemorrhoids are swollen veins in and around the rectum or anus. There are two types of hemorrhoids:  Internal hemorrhoids. These occur in the veins that are just inside the rectum. They may poke through to the outside and become irritated and painful.  External hemorrhoids. These occur in the veins that are outside of the anus and can be felt as a painful swelling or hard lump near the anus. Most hemorrhoids do not cause serious problems, and they can be managed with home treatments such as diet and lifestyle changes. If home treatments do not help your symptoms, procedures can be done to shrink or remove the hemorrhoids. What are the causes? This condition is caused by increased pressure in the anal area. This pressure may result from various things, including:  Constipation.  Straining to have a bowel movement.  Diarrhea.  Pregnancy.  Obesity.  Sitting for long periods of time.  Heavy lifting or other activity that causes you to strain.  Anal sex. What are the signs or symptoms? Symptoms of this condition include:  Pain.  Anal itching or irritation.  Rectal bleeding.  Leakage of stool (feces).  Anal swelling.  One or more lumps around the anus. How is this diagnosed? This condition can often be diagnosed through a visual exam. Other exams or tests may also be done, such as:  Examination of the rectal area with a gloved hand (digital rectal exam).  Examination of the anal canal using a small tube (anoscope).  A blood test, if you have lost a significant amount of blood.  A test to look inside the colon (sigmoidoscopy or colonoscopy). How is this treated? This condition can usually be treated at home. However, various procedures may be done if dietary changes, lifestyle changes, and other home treatments  do not help your symptoms. These procedures can help make the hemorrhoids smaller or remove them completely. Some of these procedures involve surgery, and others do not. Common procedures include:  Rubber band ligation. Rubber bands are placed at the base of the hemorrhoids to cut off the blood supply to them.  Sclerotherapy. Medicine is injected into the hemorrhoids to shrink them.  Infrared coagulation. A type of light energy is used to get rid of the hemorrhoids.  Hemorrhoidectomy surgery. The hemorrhoids are surgically removed, and the veins that supply them are tied off.  Stapled hemorrhoidopexy surgery. A circular stapling device is used to remove the hemorrhoids and use staples to cut off the blood supply to them. Follow these instructions at home: Eating and drinking   Eat foods that have a lot of fiber in them, such as whole grains, beans, nuts, fruits, and vegetables. Ask your health care provider about taking products that have added fiber (fiber supplements).  Drink enough fluid to keep your urine clear or pale yellow. Managing pain and swelling   Take warm sitz baths for 20 minutes, 3-4 times a day to ease pain and discomfort.  If directed, apply ice to the affected area. Using ice packs between sitz baths may be helpful.  Put ice in a plastic bag.  Place a towel between your skin and the bag.  Leave the ice on for 20 minutes, 2-3 times a day. General instructions   Take over-the-counter and prescription medicines only as told by your health care provider.  Use medicated  creams or suppositories as told.  Exercise regularly.  Go to the bathroom when you have the urge to have a bowel movement. Do not wait.  Avoid straining to have bowel movements.  Keep the anal area dry and clean. Use wet toilet paper or moist towelettes after a bowel movement.  Do not sit on the toilet for long periods of time. This increases blood pooling and pain. Contact a health care  provider if:  You have increasing pain and swelling that are not controlled by treatment or medicine.  You have uncontrolled bleeding.  You have difficulty having a bowel movement, or you are unable to have a bowel movement.  You have pain or inflammation outside the area of the hemorrhoids. This information is not intended to replace advice given to you by your health care provider. Make sure you discuss any questions you have with your health care provider. Document Released: 07/31/2000 Document Revised: 01/01/2016 Document Reviewed: 04/17/2015 Elsevier Interactive Patient Education  2017 [employer] NOW OFFER   [facility] FAST TRACK!!!  SAME DAY Appointments for ACUTE CARE  Such as: Sprains, Injuries, cuts, abrasions, rashes, muscle pain, joint pain, back pain Colds, flu, sore throats, headache, allergies, cough, fever  Ear pain, sinus and eye infections Abdominal pain, nausea, vomiting, diarrhea, upset stomach Animal/insect bites  3 Easy Ways to Schedule: Walk-In Scheduling Call in scheduling Mychart Sign-up: [email or web address]

## 2016-12-31 NOTE — Progress Notes (Signed)
Subjective:    Patient ID: Rhonda [name], female    DOB: 1971-01-15, 46 y.o.   MRN: [id number]  HPI  Rhonda [name] is a 46 year old who is here today for a hemorrhoid that started one week ago. Associated itching has been present. She reports going to the ED on 12/27/16 for evaluation and was found to have an external hemorrhoid that was painful but not thrombosed. History of constipation is present; treatment with fiber and stool softener has provided benefit and she reports having a BM daily now or no less than every other day.  She has a history of hemorrhoids many years ago during pregnancy which was 26 years ago.  Aggravated with BMs and sitting for extended periods of time Treatment with OTC preparation H with suppositories and lidocaine cream have provided limited benefit.  She did not fill the prescription for rectal suppositories provided in the ED as her insurance did not pay for this medication. She denies fever, chills, sweats, N/V/D, abdominal pain, or rectal bleeding.  Review of Systems  Constitutional: Negative for chills, fatigue and fever.  Respiratory: Negative for cough, shortness of breath and wheezing.   Cardiovascular: Negative for chest pain and palpitations.  Gastrointestinal: Negative for abdominal pain, constipation, diarrhea, nausea and vomiting.       Hemorrhoid  Musculoskeletal: Neck pain:    Skin: Negative for rash.   Past Medical History:  Diagnosis Date  . Anxiety   . C1q nephropathy   . Cancer [facility])    history of thyroid cancer  . Chronic constipation   . CKD (chronic kidney disease), stage II nephrologist-  dr Gretchen Short [facility])   secondary to C1q nephropathy/  hypertension  . Family history of adverse reaction to anesthesia    father hard to put down for anesthesia  . GERD (gastroesophageal reflux disease)   . H/O radioactive iodine thyroid ablation    1992  right throid  . History of exercise intolerance    01-052011  ETT--  normal w/ no ischemia per cardiologist (dr Aundra Dubin) epic note  . History of pulmonary embolus (PE)    07/ 2008-- treated w/ coumadin  . History of syncope    recurrent --  workup done per epic -- dx orthostatic hypotension  . Hypertension   . IBS (irritable bowel syndrome)   . Pelvic pain in female   . 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 Proteinuria   . Seasonal and perennial allergic rhinitis   . Wears contact lenses      Social History   Social History  . Marital status: Single    Spouse name: N/A  . Number of children: 2  . Years of education: N/A   Occupational History  . [employer]   Social History Main Topics  . Smoking status: Never Smoker  . Smokeless tobacco: Never Used  . Alcohol use No  . Drug use: No  . Sexual activity: Yes    Birth control/ protection: Surgical   Other Topics Concern  . Not on file   Social History Narrative  . No narrative on file    Past Surgical History:  Procedure Laterality Date  . [place] STUDY N/A 11/16/2016   Procedure: [place] STUDY;  Surgeon: Doran Stabler, MD;  [facility];  Service: Gastroenterology;  Laterality: N/A;  . ABDOMINAL HYSTERECTOMY  1998  . CYSTOSCOPY N/A 04/28/2016   Procedure: CYSTOSCOPY;  Surgeon:  Arvella Nigh, MD;  Location: [place] [facility];  Service: Gynecology;  Laterality: N/A;  . ESOPHAGEAL MANOMETRY N/A 11/16/2016   Procedure: ESOPHAGEAL MANOMETRY (EM);  Surgeon: Doran Stabler, MD;  [facility];  Service: Gastroenterology;  Laterality: N/A;  . ESOPHAGOGASTRODUODENOSCOPY    . LAPAROSCOPIC CHOLECYSTECTOMY  1997  . LAPAROSCOPY N/A 03/26/2016   Procedure: LAPAROSCOPY DIAGNOSTIC, LYSIS OF ADHESIONS;  Surgeon: Arvella Nigh, MD;  Location: [place];  Service: Gynecology;  Laterality: N/A;  . LAPAROTOMY N/A 04/28/2016   Procedure: EXPLORATORY LAPAROTOMY;  Surgeon: Arvella Nigh, MD;  Location: [place] [facility];   Service: Gynecology;  Laterality: N/A;  . RENAL BIOPSY    . SALPINGOOPHORECTOMY Bilateral 04/28/2016   Procedure: Bilateral OOPHORECTOMY;  Surgeon: Arvella Nigh, MD;  Location: [place] [facility];  Service: Gynecology;  Laterality: Bilateral;  . THYROIDECTOMY Left 1991  . TRANSTHORACIC ECHOCARDIOGRAM  04/27/2012   normal LV, ef 60-65%/  trivial MR  . TUBAL LIGATION  1992    Family History  Problem Relation Age of Onset  . Hypertension Father   . Coronary artery disease Father   . Diabetes Father   . Stroke Father   . Prostate cancer Father   . Diabetes Mother        Pre-diabetic  . Hypertension Mother   . Breast cancer Maternal Aunt   . Colon cancer Neg Hx   . Colon polyps Neg Hx   . Kidney disease Neg Hx   . Esophageal cancer Neg Hx   . Gallbladder disease Neg Hx     Allergies  Allergen Reactions  . Aspirin Hives  . Flagyl [Metronidazole] Nausea And Vomiting    With oral flagyl.   Has tolerated vaginal flagyl.    . Food Other (See Comments)    Lima beans--  Positive allergy test   . Tramadol Other (See Comments)    Headache   . Dilaudid [Hydromorphone] Itching    Current Outpatient Prescriptions on File Prior to Visit  Medication Sig Dispense Refill  . acyclovir cream (ZOVIRAX) 5 % Apply 1 application topically 3 (three) times daily. 15 g 1  . albuterol (PROVENTIL HFA;VENTOLIN HFA) 108 (90 Base) MCG/ACT inhaler Inhale 2 puffs into the lungs every 6 (six) hours as needed for wheezing or shortness of breath. 1 Inhaler 5  . ALPRAZolam (XANAX) 0.25 MG tablet Take 1 tablet (0.25 mg total) by mouth 2 (two) times daily as needed for anxiety. 60 tablet 0  . alum & mag hydroxide-simeth (MAALOX/MYLANTA) 200-200-20 MG/5ML suspension Take 30 mLs by mouth every 6 (six) hours as needed for indigestion or heartburn.     Marland Kitchen amitriptyline (ELAVIL) 10 MG tablet Take 1 tablet (10 mg total) by mouth at bedtime. 30 tablet 6  . amLODipine (NORVASC) 5 MG tablet TAKE 1 TABLET BY MOUTH EVERY DAY 30 tablet 5    . dibucaine (NUPERCAINAL) 1 % OINT Place 1 application rectally as needed for pain. 28 g 0  . docusate sodium (COLACE) 100 MG capsule Take 100 mg by mouth every morning.     Marland Kitchen HYDROcodone-acetaminophen (NORCO/VICODIN) 5-325 MG tablet Take 2 tablets by mouth every 4 (four) hours as needed. 6 tablet 0  . hydrocortisone (ANUSOL-HC) 25 MG suppository Place 1 suppository (25 mg total) rectally 2 (two) times daily. 12 suppository 0  . hyoscyamine (LEVSIN SL) 0.125 MG SL tablet Place 1 tablet (0.125 mg total) under the tongue every 8 (eight) hours as needed. 30 tablet 1  . levothyroxine (SYNTHROID, LEVOTHROID) 150 MCG tablet  TAKE 1 TABLET BY MOUTH EVERY DAY BEFORE BREAKFAST 30 tablet 5  . levothyroxine (SYNTHROID, LEVOTHROID) 150 MCG tablet TAKE 1 TABLET BY MOUTH EVERY DAY BEFORE BREAKFAST 30 tablet 0  . loratadine (CLARITIN) 10 MG tablet Take 10 mg by mouth every morning.     . metoprolol succinate (TOPROL-XL) 50 MG 24 hr tablet TAKE 1 TABLET BY MOUTH EVERY DAY IMMEDIATELY FOLLOWING A MEAL 30 tablet 5  . metoprolol succinate (TOPROL-XL) 50 MG 24 hr tablet TAKE 1 TABLET BY MOUTH EVERY DAY IMMEDIATELY FOLLOWING A MEAL 30 tablet 0  . Multiple Vitamin (MULTIVITAMIN) tablet Take 1 tablet by mouth every morning.     Marland Kitchen omeprazole (PRILOSEC) 40 MG capsule TAKE 1 CAPSULE(40 MG) BY MOUTH DAILY 30 capsule 5  . predniSONE (DELTASONE) 20 MG tablet Take 1 tablet (20 mg total) by mouth 2 (two) times daily with a meal. 12 tablet 0  . promethazine (PHENERGAN) 25 MG tablet Take 1 tablet (25 mg total) by mouth every 6 (six) hours as needed for nausea or vomiting. 30 tablet 0  . psyllium (METAMUCIL) 0.52 g capsule Take 1 capsule (0.52 g total) by mouth daily. 30 capsule 0  . SUMAtriptan (IMITREX) 100 MG tablet Take 1 tablet (100 mg total) by mouth once as needed. May repeat in 2 hours if headache persists or recurs. No more than 2x in one day. 10 tablet 12  . valsartan (DIOVAN) 320 MG tablet Take 320 mg by mouth every  morning.   5   Current Facility-Administered Medications on File Prior to Visit  Medication Dose Route Frequency Provider Last Rate Last Dose  . 0.9 %  sodium chloride infusion  500 mL Intravenous Continuous Danis, Estill Cotta III, MD        BP 128/88 (BP Location: Right Arm, Patient Position: Sitting, Cuff Size: Large)   Pulse 78   Temp 98.7 F (37.1 C) (Oral)   Wt 206 lb (93.4 kg)   SpO2 97%   BMI 34.28 kg/m       Objective:   Physical Exam  Constitutional: She is oriented to person, place, and time. She appears well-developed and well-nourished.  Eyes: Pupils are equal, round, and reactive to light. No scleral icterus.  Neck: Neck supple.  Cardiovascular: Normal rate and regular rhythm.   Pulmonary/Chest: Effort normal and breath sounds normal. She has no wheezes. She has no rales.  Abdominal: Soft. Bowel sounds are normal. There is no tenderness.  Genitourinary:  Genitourinary Comments: Approximately 2 cm external hemorrhoid present without bleeding. Tender to exam, no internal hemorrhoid noted, normal rectal tone. Hemorrhoid is not thrombosed  Lymphadenopathy:    She has no cervical adenopathy.  Neurological: She is alert and oriented to person, place, and time.  Skin: Skin is warm and dry. No rash noted.  Psychiatric: She has a normal mood and affect. Her behavior is normal.       Assessment & Plan:  1. External hemorrhoid Treat with Anusol-HC rectal cream which appears to be covered by her insurance. Advised continued fiber and stool softener. She did not want any medication for pain. She will continue to use lidocaine cream that she has been using and will add OTC wipes. Advised to follow up with PCP if conservative measures do not improve her symptoms in 5 to 7 days or she has new or worsening symptoms.  - hydrocortisone (ANUSOL-HC) 2.5 % rectal cream; Place 1 application rectally 2 (two) times daily.  Dispense: 30 g; Refill: 0  Gregary Signs  Jamille Fisher, FNP-C

## 2017-01-04 ENCOUNTER — Other Ambulatory Visit: Payer: Self-pay | Admitting: Internal Medicine

## 2017-01-19 ENCOUNTER — Other Ambulatory Visit: Payer: Self-pay | Admitting: Internal Medicine

## 2017-01-31 ENCOUNTER — Other Ambulatory Visit: Payer: Self-pay | Admitting: Internal Medicine

## 2017-02-09 ENCOUNTER — Ambulatory Visit (INDEPENDENT_AMBULATORY_CARE_PROVIDER_SITE_OTHER): Payer: BLUE CROSS/BLUE SHIELD | Admitting: Internal Medicine

## 2017-02-09 ENCOUNTER — Encounter: Payer: Self-pay | Admitting: Internal Medicine

## 2017-02-09 VITALS — BP 132/70 | HR 85 | Temp 98.3°F | Ht 65.0 in | Wt 205.8 lb

## 2017-02-09 DIAGNOSIS — IMO0002 Reserved for concepts with insufficient information to code with codable children: Secondary | ICD-10-CM

## 2017-02-09 DIAGNOSIS — I10 Essential (primary) hypertension: Secondary | ICD-10-CM

## 2017-02-09 DIAGNOSIS — Y633 Inadvertent exposure of patient to radiation during medical care: Secondary | ICD-10-CM

## 2017-02-09 MED ORDER — PREDNISONE 20 MG PO TABS: 20.0000 mg | ORAL_TABLET | Freq: Two times a day (BID) | ORAL | 0 refills | Status: DC

## 2017-02-09 NOTE — Patient Instructions (Signed)
You  may move around, but avoid painful motions and activities.  Apply heat  to the sore area for 15 to 20 minutes 3 or 4 times daily for the next two to 3 days.  Most patients with low back pain will improve with time over the next two to 6 weeks.  Keep active but avoid any activities that cause pain.  Apply moist heat to the low back area several times daily.  Limit your sodium (Salt) intake .Please check your blood pressure on a regular basis.  If it is consistently greater than 150/90, please make an office appointment.

## 2017-02-09 NOTE — Progress Notes (Signed)
Subjective:    Patient ID: Rhonda [name], female    DOB: [birth year], 45 y.o.   MRN: [id number]  HPI  46 year old patient who presents with right-sided leg pain of 2 weeks' duration.  She states that she has similar episodes once or twice per year and these respond to prednisone She did have a lumbar MRI approximately 11 years ago that revealed some early arthritic changes. She has essential hypertension.  She has remote history of acute pulmonary embolism in 2007 that apparently was unprovoked.  Since that time she has had 13  Additional chest CTAs to exclude recurrent pulmonary embolism.  Her accumulated radiation exposure was discussed at length today  Past Medical History:  Diagnosis Date  . Anxiety   . C1q nephropathy   . Cancer [facility])    history of thyroid cancer  . Chronic constipation   . CKD (chronic kidney disease), stage II nephrologist-  dr Gretchen Short [facility])   secondary to C1q nephropathy/  hypertension  . Family history of adverse reaction to anesthesia    father hard to put down for anesthesia  . GERD (gastroesophageal reflux disease)   . H/O radioactive iodine thyroid ablation    1992  right throid  . History of exercise intolerance    01-052011  ETT--  normal w/ no ischemia per cardiologist (dr Aundra Dubin) epic note  . History of pulmonary embolus (PE)    07/ 2008-- treated w/ coumadin  . History of syncope    recurrent --  workup done per epic -- dx orthostatic hypotension  . Hypertension   . IBS (irritable bowel syndrome)   . Pelvic pain in female   . 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 Proteinuria   . Seasonal and perennial allergic rhinitis   . Wears contact lenses      Social History   Social History  . Marital status: Single    Spouse name: N/A  . Number of children: 2  . Years of education: N/A   Occupational History  . [employer]   Social History Main Topics  .  Smoking status: Never Smoker  . Smokeless tobacco: Never Used  . Alcohol use No  . Drug use: No  . Sexual activity: Yes    Birth control/ protection: Surgical   Other Topics Concern  . Not on file   Social History Narrative  . No narrative on file    Past Surgical History:  Procedure Laterality Date  . [place] STUDY N/A 11/16/2016   Procedure: [place] STUDY;  Surgeon: Doran Stabler, MD;  [facility];  Service: Gastroenterology;  Laterality: N/A;  . ABDOMINAL HYSTERECTOMY  1998  . CYSTOSCOPY N/A 04/28/2016   Procedure: CYSTOSCOPY;  Surgeon: Arvella Nigh, MD;  Location: [place] [facility];  Service: Gynecology;  Laterality: N/A;  . ESOPHAGEAL MANOMETRY N/A 11/16/2016   Procedure: ESOPHAGEAL MANOMETRY (EM);  Surgeon: Doran Stabler, MD;  [facility];  Service: Gastroenterology;  Laterality: N/A;  . ESOPHAGOGASTRODUODENOSCOPY    . LAPAROSCOPIC CHOLECYSTECTOMY  1997  . LAPAROSCOPY N/A 03/26/2016   Procedure: LAPAROSCOPY DIAGNOSTIC, LYSIS OF ADHESIONS;  Surgeon: Arvella Nigh, MD;  Location: [place];  Service: Gynecology;  Laterality: N/A;  . LAPAROTOMY N/A 04/28/2016   Procedure: EXPLORATORY LAPAROTOMY;  Surgeon: Arvella Nigh, MD;  Location: [place] [facility];  Service: Gynecology;  Laterality: N/A;  . RENAL BIOPSY    . SALPINGOOPHORECTOMY Bilateral 04/28/2016  Procedure: Bilateral OOPHORECTOMY;  Surgeon: Arvella Nigh, MD;  Location: [place] [facility];  Service: Gynecology;  Laterality: Bilateral;  . THYROIDECTOMY Left 1991  . TRANSTHORACIC ECHOCARDIOGRAM  04/27/2012   normal LV, ef 60-65%/  trivial MR  . TUBAL LIGATION  1992    Family History  Problem Relation Age of Onset  . Hypertension Father   . Coronary artery disease Father   . Diabetes Father   . Stroke Father   . Prostate cancer Father   . Diabetes Mother        Pre-diabetic  . Hypertension Mother   . Breast cancer Maternal Aunt   . Colon cancer Neg Hx   . Colon polyps Neg Hx   . Kidney disease Neg Hx     . Esophageal cancer Neg Hx   . Gallbladder disease Neg Hx     Allergies  Allergen Reactions  . Aspirin Hives  . Flagyl [Metronidazole] Nausea And Vomiting    With oral flagyl.   Has tolerated vaginal flagyl.    . Food Other (See Comments)    Lima beans--  Positive allergy test   . Tramadol Other (See Comments)    Headache   . Dilaudid [Hydromorphone] Itching    Current Outpatient Prescriptions on File Prior to Visit  Medication Sig Dispense Refill  . acyclovir cream (ZOVIRAX) 5 % Apply 1 application topically 3 (three) times daily. 15 g 1  . albuterol (PROVENTIL HFA;VENTOLIN HFA) 108 (90 Base) MCG/ACT inhaler Inhale 2 puffs into the lungs every 6 (six) hours as needed for wheezing or shortness of breath. 1 Inhaler 5  . ALPRAZolam (XANAX) 0.25 MG tablet Take 1 tablet (0.25 mg total) by mouth 2 (two) times daily as needed for anxiety. 60 tablet 0  . alum & mag hydroxide-simeth (MAALOX/MYLANTA) 200-200-20 MG/5ML suspension Take 30 mLs by mouth every 6 (six) hours as needed for indigestion or heartburn.     Marland Kitchen amitriptyline (ELAVIL) 10 MG tablet Take 1 tablet (10 mg total) by mouth at bedtime. 30 tablet 6  . amLODipine (NORVASC) 5 MG tablet TAKE 1 TABLET BY MOUTH EVERY DAY 30 tablet 5  . dibucaine (NUPERCAINAL) 1 % OINT Place 1 application rectally as needed for pain. 28 g 0  . docusate sodium (COLACE) 100 MG capsule Take 100 mg by mouth every morning.     Marland Kitchen HYDROcodone-acetaminophen (NORCO/VICODIN) 5-325 MG tablet Take 2 tablets by mouth every 4 (four) hours as needed. 6 tablet 0  . hydrocortisone (ANUSOL-HC) 2.5 % rectal cream Place 1 application rectally 2 (two) times daily. 30 g 0  . hydrocortisone (ANUSOL-HC) 25 MG suppository Place 1 suppository (25 mg total) rectally 2 (two) times daily. 12 suppository 0  . hyoscyamine (LEVSIN SL) 0.125 MG SL tablet Place 1 tablet (0.125 mg total) under the tongue every 8 (eight) hours as needed. 30 tablet 1  . levothyroxine (SYNTHROID, LEVOTHROID)  150 MCG tablet TAKE 1 TABLET BY MOUTH EVERY DAY BEFORE BREAKFAST 30 tablet 5  . levothyroxine (SYNTHROID, LEVOTHROID) 150 MCG tablet TAKE 1 TABLET BY MOUTH EVERY DAY BEFORE BREAKFAST 30 tablet 0  . loratadine (CLARITIN) 10 MG tablet Take 10 mg by mouth every morning.     . metoprolol succinate (TOPROL-XL) 50 MG 24 hr tablet TAKE 1 TABLET BY MOUTH EVERY DAY IMMEDIATELY FOLLOWING A MEAL 30 tablet 5  . metoprolol succinate (TOPROL-XL) 50 MG 24 hr tablet TAKE 1 TABLET BY MOUTH EVERY DAY IMMEDIATELY FOLLOWING A MEAL 30 tablet 0  . Multiple Vitamin (MULTIVITAMIN)  tablet Take 1 tablet by mouth every morning.     Marland Kitchen omeprazole (PRILOSEC) 40 MG capsule TAKE 1 CAPSULE(40 MG) BY MOUTH DAILY 30 capsule 0  . predniSONE (DELTASONE) 20 MG tablet Take 1 tablet (20 mg total) by mouth 2 (two) times daily with a meal. 12 tablet 0  . promethazine (PHENERGAN) 25 MG tablet TAKE 1 TABLET(25 MG) BY MOUTH EVERY 6 HOURS AS NEEDED FOR NAUSEA OR VOMITING 30 tablet 0  . psyllium (METAMUCIL) 0.52 g capsule Take 1 capsule (0.52 g total) by mouth daily. 30 capsule 0  . SUMAtriptan (IMITREX) 100 MG tablet Take 1 tablet (100 mg total) by mouth once as needed. May repeat in 2 hours if headache persists or recurs. No more than 2x in one day. 10 tablet 12  . valsartan (DIOVAN) 320 MG tablet Take 320 mg by mouth every morning.   5   Current Facility-Administered Medications on File Prior to Visit  Medication Dose Route Frequency Provider Last Rate Last Dose  . 0.9 %  sodium chloride infusion  500 mL Intravenous Continuous Danis, Estill Cotta III, MD        BP 132/70 (BP Location: Left Arm, Patient Position: Sitting, Cuff Size: Normal)   Pulse 85   Temp 98.3 F (36.8 C) (Oral)   Ht 5\' 5"  (1.651 m)   Wt 205 lb 12.8 oz (93.4 kg)   SpO2 97%   BMI 34.25 kg/m     Review of Systems  Constitutional: Positive for activity change.  HENT: Negative for congestion, dental problem, hearing loss, rhinorrhea, sinus pressure, sore throat and  tinnitus.   Eyes: Negative for pain, discharge and visual disturbance.  Respiratory: Negative for cough and shortness of breath.   Cardiovascular: Negative for chest pain, palpitations and leg swelling.  Gastrointestinal: Negative for abdominal distention, abdominal pain, blood in stool, constipation, diarrhea, nausea and vomiting.  Genitourinary: Negative for difficulty urinating, dysuria, flank pain, frequency, hematuria, pelvic pain, urgency, vaginal bleeding, vaginal discharge and vaginal pain.  Musculoskeletal: Positive for back pain. Negative for arthralgias, gait problem and joint swelling.  Skin: Negative for rash.  Neurological: Negative for dizziness, syncope, speech difficulty, weakness, numbness and headaches.  Hematological: Negative for adenopathy.  Psychiatric/Behavioral: Negative for agitation, behavioral problems and dysphoric mood. The patient is not nervous/anxious.        Objective:   Physical Exam  Constitutional:  Blood pressure 130/70  Musculoskeletal:  Negative straight leg testing on the left Straight leg testing on the right produce pain in the right hamstring area. Neurovascular structures intact  Normal ankle flexion and extension          Assessment & Plan:   Recurrent right leg pain.  Possible sciatica.  This has occurred occasionally over the years and has responded well to prednisone.  Will treat with prednisone 20 mg twice a day for 1 week Essential hypertension, stable  High  medical diagnostic radiation exposure.  This was discussed with the patient at length.  She was told to inform providers of her multiple CT scans .  If one is considered in the future and to consider a nuclear lung scan and/or d-dimer testing . Nyoka Cowden

## 2017-02-28 ENCOUNTER — Other Ambulatory Visit: Payer: Self-pay | Admitting: Internal Medicine

## 2017-03-18 ENCOUNTER — Telehealth: Payer: Self-pay | Admitting: Internal Medicine

## 2017-03-18 MED ORDER — PROMETHAZINE HCL 25 MG PO TABS: ORAL_TABLET | ORAL | 0 refills | Status: DC

## 2017-03-18 NOTE — Telephone Encounter (Signed)
Patient called and would like the below medication refilled:   promethazine (PHENERGAN) 25 MG tablet

## 2017-03-18 NOTE — Telephone Encounter (Signed)
Medication was refilled.

## 2017-03-27 ENCOUNTER — Encounter (HOSPITAL_COMMUNITY): Payer: Self-pay | Admitting: *Deleted

## 2017-03-27 ENCOUNTER — Emergency Department (HOSPITAL_COMMUNITY)
Admission: EM | Admit: 2017-03-27 | Discharge: 2017-03-27 | Disposition: A | Discharge status: Home / Self Care | Payer: BLUE CROSS/BLUE SHIELD | Attending: Emergency Medicine | Admitting: Emergency Medicine

## 2017-03-27 ENCOUNTER — Emergency Department (HOSPITAL_COMMUNITY): Payer: BLUE CROSS/BLUE SHIELD

## 2017-03-27 DIAGNOSIS — Z5321 Procedure and treatment not carried out due to patient leaving prior to being seen by health care provider: Secondary | ICD-10-CM | POA: Insufficient documentation

## 2017-03-27 DIAGNOSIS — R079 Chest pain, unspecified: Secondary | ICD-10-CM | POA: Diagnosis not present

## 2017-03-27 LAB — BASIC METABOLIC PANEL
ANION GAP: 7 (ref 5–15)
BUN: 18 mg/dL (ref 6–20)
CHLORIDE: 107 mmol/L (ref 101–111)
CO2: 26 mmol/L (ref 22–32)
Calcium: 9.7 mg/dL (ref 8.9–10.3)
Creatinine, Ser: 1.1 mg/dL — ABNORMAL HIGH (ref 0.44–1.00)
GFR calc Af Amer: >60 mL/min (ref >60)
GFR calc non Af Amer: 58 mL/min — ABNORMAL LOW (ref >60)
Glucose, Bld: 93 mg/dL (ref 65–99)
POTASSIUM: 4.6 mmol/L (ref 3.5–5.1)
Sodium: 140 mmol/L (ref 135–145)

## 2017-03-27 LAB — CBC
HEMATOCRIT: 41.6 % (ref 36.0–46.0)
HEMOGLOBIN: 13.6 g/dL (ref 12.0–15.0)
MCH: 27.8 pg (ref 26.0–34.0)
MCHC: 32.7 g/dL (ref 30.0–36.0)
MCV: 85.1 fL (ref 78.0–100.0)
Platelets: 287 10*3/uL (ref 150–400)
RBC: 4.89 MIL/uL (ref 3.87–5.11)
RDW: 13.2 % (ref 11.5–15.5)
WBC: 7 10*3/uL (ref 4.0–10.5)

## 2017-03-27 LAB — TROPONIN I: Troponin I: <0.03 ng/mL (ref <0.03)

## 2017-03-27 LAB — POCT I-STAT TROPONIN I: Troponin i, poc: 0 ng/mL (ref 0.00–0.08)

## 2017-03-27 NOTE — ED Notes (Signed)
Informed by Registration staff that pt has left.

## 2017-03-27 NOTE — ED Triage Notes (Signed)
Pt reports intermittent chest pain along with shoulder pain and back pain x 5 days. Pt denies SOB and no diaphoresis observed.  Pt a/o x 4 and ambulatory.  Pt reports chest pain, arm pain, and back pain a 4 out of 10. Pt reports laying down makes the pain worse and sitting up makes the pain better.

## 2017-03-27 NOTE — ED Notes (Signed)
PATIENT HAS BLUE AND GOLD IN LAB

## 2017-03-28 ENCOUNTER — Other Ambulatory Visit: Payer: Self-pay | Admitting: Internal Medicine

## 2017-04-05 ENCOUNTER — Ambulatory Visit (INDEPENDENT_AMBULATORY_CARE_PROVIDER_SITE_OTHER): Payer: BLUE CROSS/BLUE SHIELD | Admitting: Internal Medicine

## 2017-04-05 ENCOUNTER — Encounter: Payer: Self-pay | Admitting: Internal Medicine

## 2017-04-05 VITALS — BP 122/82 | HR 82 | Temp 98.2°F | Ht 65.5 in | Wt 206.6 lb

## 2017-04-05 DIAGNOSIS — I10 Essential (primary) hypertension: Secondary | ICD-10-CM

## 2017-04-05 DIAGNOSIS — E785 Hyperlipidemia, unspecified: Secondary | ICD-10-CM

## 2017-04-05 DIAGNOSIS — E039 Hypothyroidism, unspecified: Secondary | ICD-10-CM

## 2017-04-05 MED ORDER — OLMESARTAN MEDOXOMIL 40 MG PO TABS: 40.0000 mg | ORAL_TABLET | Freq: Every day | ORAL | Status: DC

## 2017-04-05 NOTE — Patient Instructions (Signed)
Limit your sodium (Salt) intake  Please check your blood pressure on a regular basis.  If it is consistently greater than 150/90, please make an office appointment.     It is important that you exercise regularly, at least 20 minutes 3 to 4 times per week.  If you develop chest pain or shortness of breath seek  medical attention. 

## 2017-04-05 NOTE — Progress Notes (Signed)
Subjective:    Patient ID: Rhonda [name], female    DOB: 1970/11/29, 46 y.o.   MRN: [id number]  HPI  46 year old patient who is seen today for her by monthly follow-up.  She has a history of hypertension and is also followed by nephrology.  Due to unavailability, Diovan  has been discontinued and Benicar 40 mg substituted.  Doing quite well today without concerns or complaints.  She does have a history of hypothyroidism and mild dyslipidemia.  Past Medical History:  Diagnosis Date  . Anxiety   . C1q nephropathy   . Cancer [facility])    history of thyroid cancer  . Chronic constipation   . CKD (chronic kidney disease), stage II nephrologist-  dr Gretchen Short [facility])   secondary to C1q nephropathy/  hypertension  . Family history of adverse reaction to anesthesia    father hard to put down for anesthesia  . GERD (gastroesophageal reflux disease)   . H/O radioactive iodine thyroid ablation    1992  right throid  . History of exercise intolerance    01-052011  ETT--  normal w/ no ischemia per cardiologist (dr Aundra Dubin) epic note  . History of pulmonary embolus (PE)    07/ 2008-- treated w/ coumadin  . History of syncope    recurrent --  workup done per epic -- dx orthostatic hypotension  . Hypertension   . IBS (irritable bowel syndrome)   . Pelvic pain in female   . 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 Proteinuria   . Seasonal and perennial allergic rhinitis   . Wears contact lenses      Social History   Social History  . Marital status: Single    Spouse name: N/A  . Number of children: 2  . Years of education: N/A   Occupational History  . [employer]   Social History Main Topics  . Smoking status: Never Smoker  . Smokeless tobacco: Never Used  . Alcohol use No  . Drug use: No  . Sexual activity: Yes    Birth control/ protection: Surgical   Other Topics Concern  . Not on file   Social  History Narrative  . No narrative on file    Past Surgical History:  Procedure Laterality Date  . [place] STUDY N/A 11/16/2016   Procedure: [place] STUDY;  Surgeon: Doran Stabler, MD;  [facility];  Service: Gastroenterology;  Laterality: N/A;  . ABDOMINAL HYSTERECTOMY  1998  . CYSTOSCOPY N/A 04/28/2016   Procedure: CYSTOSCOPY;  Surgeon: Arvella Nigh, MD;  Location: [facility];  Service: Gynecology;  Laterality: N/A;  . ESOPHAGEAL MANOMETRY N/A 11/16/2016   Procedure: ESOPHAGEAL MANOMETRY (EM);  Surgeon: Doran Stabler, MD;  [facility];  Service: Gastroenterology;  Laterality: N/A;  . ESOPHAGOGASTRODUODENOSCOPY    . LAPAROSCOPIC CHOLECYSTECTOMY  1997  . LAPAROSCOPY N/A 03/26/2016   Procedure: LAPAROSCOPY DIAGNOSTIC, LYSIS OF ADHESIONS;  Surgeon: Arvella Nigh, MD;  Location: [facility];  Service: Gynecology;  Laterality: N/A;  . LAPAROTOMY N/A 04/28/2016   Procedure: EXPLORATORY LAPAROTOMY;  Surgeon: Arvella Nigh, MD;  Location: [facility];  Service: Gynecology;  Laterality: N/A;  . RENAL BIOPSY    . SALPINGOOPHORECTOMY Bilateral 04/28/2016   Procedure: Bilateral OOPHORECTOMY;  Surgeon: Arvella Nigh, MD;  Location: [facility];  Service: Gynecology;  Laterality: Bilateral;  . THYROIDECTOMY Left 1991  . TRANSTHORACIC ECHOCARDIOGRAM  04/27/2012   normal LV, ef  60-65%/  trivial MR  . TUBAL LIGATION  1992    Family History  Problem Relation Age of Onset  . Hypertension Father   . Coronary artery disease Father   . Diabetes Father   . Stroke Father   . Prostate cancer Father   . Diabetes Mother        Pre-diabetic  . Hypertension Mother   . Breast cancer Maternal Aunt   . Colon cancer Neg Hx   . Colon polyps Neg Hx   . Kidney disease Neg Hx   . Esophageal cancer Neg Hx   . Gallbladder disease Neg Hx     Allergies  Allergen Reactions  . Aspirin Hives  . Flagyl [Metronidazole] Nausea And Vomiting    With oral flagyl.   Has tolerated vaginal flagyl.     . Food Other (See Comments)    Lima beans--  Positive allergy test   . Tramadol Other (See Comments)    Headache   . Dilaudid [Hydromorphone] Itching    Current Outpatient Prescriptions on File Prior to Visit  Medication Sig Dispense Refill  . albuterol (PROVENTIL HFA;VENTOLIN HFA) 108 (90 Base) MCG/ACT inhaler Inhale 2 puffs into the lungs every 6 (six) hours as needed for wheezing or shortness of breath. 1 Inhaler 5  . ALPRAZolam (XANAX) 0.25 MG tablet Take 1 tablet (0.25 mg total) by mouth 2 (two) times daily as needed for anxiety. 60 tablet 0  . amLODipine (NORVASC) 5 MG tablet TAKE 1 TABLET BY MOUTH EVERY DAY 30 tablet 5  . levothyroxine (SYNTHROID, LEVOTHROID) 150 MCG tablet TAKE 1 TABLET BY MOUTH EVERY DAY BEFORE BREAKFAST 30 tablet 0  . metoprolol succinate (TOPROL-XL) 50 MG 24 hr tablet TAKE 1 TABLET BY MOUTH EVERY DAY IMMEDIATELY FOLLOWING A MEAL 30 tablet 5  . omeprazole (PRILOSEC) 40 MG capsule TAKE 1 CAPSULE(40 MG) BY MOUTH DAILY 30 capsule 0  . predniSONE (DELTASONE) 20 MG tablet Take 1 tablet (20 mg total) by mouth 2 (two) times daily with a meal. 14 tablet 0  . SUMAtriptan (IMITREX) 100 MG tablet Take 1 tablet (100 mg total) by mouth once as needed. May repeat in 2 hours if headache persists or recurs. No more than 2x in one day. 10 tablet 12   No current facility-administered medications on file prior to visit.     BP 122/82 (BP Location: Left Arm, Patient Position: Sitting, Cuff Size: Normal)   Pulse 82   Temp 98.2 F (36.8 C) (Oral)   Ht 5' 5.5" (1.664 m)   Wt 206 lb 9.6 oz (93.7 kg)   SpO2 98%   BMI 33.86 kg/m     Review of Systems  Constitutional: Negative.   HENT: Negative for congestion, dental problem, hearing loss, rhinorrhea, sinus pressure, sore throat and tinnitus.   Eyes: Negative for pain, discharge and visual disturbance.  Respiratory: Negative for cough and shortness of breath.   Cardiovascular: Negative for chest pain, palpitations and leg  swelling.  Gastrointestinal: Negative for abdominal distention, abdominal pain, blood in stool, constipation, diarrhea, nausea and vomiting.  Genitourinary: Negative for difficulty urinating, dysuria, flank pain, frequency, hematuria, pelvic pain, urgency, vaginal bleeding, vaginal discharge and vaginal pain.  Musculoskeletal: Negative for arthralgias, gait problem and joint swelling.  Skin: Negative for rash.  Neurological: Negative for dizziness, syncope, speech difficulty, weakness, numbness and headaches.  Hematological: Negative for adenopathy.  Psychiatric/Behavioral: Negative for agitation, behavioral problems and dysphoric mood. The patient is not nervous/anxious.  Objective:   Physical Exam  Constitutional: She is oriented to person, place, and time. She appears well-developed and well-nourished.  HENT:  Head: Normocephalic.  Right Ear: External ear normal.  Left Ear: External ear normal.  Mouth/Throat: Oropharynx is clear and moist.  Eyes: Pupils are equal, round, and reactive to light. Conjunctivae and EOM are normal.  Neck: Normal range of motion. Neck supple. No thyromegaly present.  Cardiovascular: Normal rate, regular rhythm, normal heart sounds and intact distal pulses.   Pulmonary/Chest: Effort normal and breath sounds normal.  Abdominal: Soft. Bowel sounds are normal. She exhibits no mass. There is no tenderness.  Musculoskeletal: Normal range of motion.  Lymphadenopathy:    She has no cervical adenopathy.  Neurological: She is alert and oriented to person, place, and time.  Skin: Skin is warm and dry. No rash noted.  Psychiatric: She has a normal mood and affect. Her behavior is normal.          Assessment & Plan:   Essential hypertension, well-controlled on present.  Triple therapy Hypothyroidism, stable.  Continue supplement  No change in medical regimen  Weight loss exercise encouraged .  Restricted salt diet recommended  follow-up 6  months  KWIATKOWSKI,PETER Pilar Plate

## 2017-04-27 ENCOUNTER — Other Ambulatory Visit: Payer: Self-pay | Admitting: Internal Medicine

## 2017-04-28 ENCOUNTER — Encounter: Payer: Self-pay | Admitting: Internal Medicine

## 2017-04-28 ENCOUNTER — Ambulatory Visit (INDEPENDENT_AMBULATORY_CARE_PROVIDER_SITE_OTHER): Payer: BLUE CROSS/BLUE SHIELD | Admitting: Internal Medicine

## 2017-04-28 VITALS — BP 130/72 | HR 80 | Temp 98.1°F | Ht 65.5 in | Wt 207.4 lb

## 2017-04-28 DIAGNOSIS — I10 Essential (primary) hypertension: Secondary | ICD-10-CM | POA: Diagnosis not present

## 2017-04-28 DIAGNOSIS — Z23 Encounter for immunization: Secondary | ICD-10-CM

## 2017-04-28 DIAGNOSIS — R300 Dysuria: Secondary | ICD-10-CM | POA: Diagnosis not present

## 2017-04-28 LAB — POC URINALSYSI DIPSTICK (AUTOMATED)
Bilirubin, UA: NEGATIVE
GLUCOSE UA: NEGATIVE
KETONES UA: NEGATIVE
Nitrite, UA: NEGATIVE
SPEC GRAV UA: 1.02 (ref 1.010–1.025)
Urobilinogen, UA: 0.2 E.U./dL
pH, UA: 6 (ref 5.0–8.0)

## 2017-04-28 MED ORDER — PROMETHAZINE HCL 25 MG PO TABS: 25.0000 mg | ORAL_TABLET | Freq: Three times a day (TID) | ORAL | 0 refills | Status: DC | PRN

## 2017-04-28 MED ORDER — SULFAMETHOXAZOLE-TRIMETHOPRIM 800-160 MG PO TABS: 1.0000 | ORAL_TABLET | Freq: Two times a day (BID) | ORAL | 0 refills | Status: DC

## 2017-04-28 NOTE — Patient Instructions (Addendum)
Drink as much fluid as you  can tolerate over the next few days  Take your antibiotic as prescribed until ALL of it is gone, but stop if you develop a rash, swelling, or any side effects of the medication.  Contact our office as soon as possible if  there are side effects of the medication.  Please check your blood pressure on a regular basis.  If it is consistently greater than 150/90, please make an office appointment.  I

## 2017-04-28 NOTE — Progress Notes (Signed)
Subjective:    Patient ID: Rhonda [name], female    DOB: 1971/01/14, 46 y.o.   MRN: [id number]  HPI  46 year old patient who has a history of essential hypertension. She presents with a several day history of urinary frequency and urgency.  She also has noted a dark odorous urine.  She has had a recent GYN evaluation. No fever or chills No flank pain  Past Medical History:  Diagnosis Date  . Anxiety   . C1q nephropathy   . Cancer [facility])    history of thyroid cancer  . Chronic constipation   . CKD (chronic kidney disease), stage II nephrologist-  dr Gretchen Short [facility])   secondary to C1q nephropathy/  hypertension  . Family history of adverse reaction to anesthesia    father hard to put down for anesthesia  . GERD (gastroesophageal reflux disease)   . H/O radioactive iodine thyroid ablation    1992  right throid  . History of exercise intolerance    01-052011  ETT--  normal w/ no ischemia per cardiologist (dr Aundra Dubin) epic note  . History of pulmonary embolus (PE)    07/ 2008-- treated w/ coumadin  . History of syncope    recurrent --  workup done per epic -- dx orthostatic hypotension  . Hypertension   . IBS (irritable bowel syndrome)   . Pelvic pain in female   . 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 Proteinuria   . Seasonal and perennial allergic rhinitis   . Wears contact lenses      Social History   Social History  . Marital status: Married    Spouse name: N/A  . Number of children: 2  . Years of education: N/A   Occupational History  . [employer]   Social History Main Topics  . Smoking status: Never Smoker  . Smokeless tobacco: Never Used  . Alcohol use No  . Drug use: No  . Sexual activity: Yes    Birth control/ protection: Surgical   Other Topics Concern  . Not on file   Social History Narrative  . No narrative on file    Past Surgical History:  Procedure Laterality  Date  . [place] STUDY N/A 11/16/2016   Procedure: [place] STUDY;  Surgeon: Doran Stabler, MD;  [facility];  Service: Gastroenterology;  Laterality: N/A;  . ABDOMINAL HYSTERECTOMY  1998  . CYSTOSCOPY N/A 04/28/2016   Procedure: CYSTOSCOPY;  Surgeon: Arvella Nigh, MD;  Location: [facility];  Service: Gynecology;  Laterality: N/A;  . ESOPHAGEAL MANOMETRY N/A 11/16/2016   Procedure: ESOPHAGEAL MANOMETRY (EM);  Surgeon: Doran Stabler, MD;  [facility];  Service: Gastroenterology;  Laterality: N/A;  . ESOPHAGOGASTRODUODENOSCOPY    . LAPAROSCOPIC CHOLECYSTECTOMY  1997  . LAPAROSCOPY N/A 03/26/2016   Procedure: LAPAROSCOPY DIAGNOSTIC, LYSIS OF ADHESIONS;  Surgeon: Arvella Nigh, MD;  Location: [facility];  Service: Gynecology;  Laterality: N/A;  . LAPAROTOMY N/A 04/28/2016   Procedure: EXPLORATORY LAPAROTOMY;  Surgeon: Arvella Nigh, MD;  Location: [facility];  Service: Gynecology;  Laterality: N/A;  . RENAL BIOPSY    . SALPINGOOPHORECTOMY Bilateral 04/28/2016   Procedure: Bilateral OOPHORECTOMY;  Surgeon: Arvella Nigh, MD;  Location: [facility];  Service: Gynecology;  Laterality: Bilateral;  . THYROIDECTOMY Left 1991  . TRANSTHORACIC ECHOCARDIOGRAM  04/27/2012   normal LV, ef 60-65%/  trivial MR  . [place]  Family History  Problem Relation Age of Onset  . Hypertension Father   . Coronary artery disease Father   . Diabetes Father   . Stroke Father   . Prostate cancer Father   . Diabetes Mother        Pre-diabetic  . Hypertension Mother   . Breast cancer Maternal Aunt   . Colon cancer Neg Hx   . Colon polyps Neg Hx   . Kidney disease Neg Hx   . Esophageal cancer Neg Hx   . Gallbladder disease Neg Hx     Allergies  Allergen Reactions  . Aspirin Hives  . Flagyl [Metronidazole] Nausea And Vomiting    With oral flagyl.   Has tolerated vaginal flagyl.    . Food Other (See Comments)    Lima beans--  Positive allergy test   . Tramadol Other  (See Comments)    Headache   . Dilaudid [Hydromorphone] Itching    Current Outpatient Prescriptions on File Prior to Visit  Medication Sig Dispense Refill  . albuterol (PROVENTIL HFA;VENTOLIN HFA) 108 (90 Base) MCG/ACT inhaler Inhale 2 puffs into the lungs every 6 (six) hours as needed for wheezing or shortness of breath. 1 Inhaler 5  . ALPRAZolam (XANAX) 0.25 MG tablet Take 1 tablet (0.25 mg total) by mouth 2 (two) times daily as needed for anxiety. 60 tablet 0  . amLODipine (NORVASC) 5 MG tablet TAKE 1 TABLET BY MOUTH EVERY DAY 30 tablet 5  . levothyroxine (SYNTHROID, LEVOTHROID) 150 MCG tablet TAKE 1 TABLET BY MOUTH EVERY DAY BEFORE BREAKFAST 30 tablet 0  . metoprolol succinate (TOPROL-XL) 50 MG 24 hr tablet TAKE 1 TABLET BY MOUTH EVERY DAY IMMEDIATELY FOLLOWING A MEAL 30 tablet 5  . olmesartan (BENICAR) 40 MG tablet Take 1 tablet (40 mg total) by mouth daily.    Marland Kitchen omeprazole (PRILOSEC) 40 MG capsule TAKE 1 CAPSULE BY MOUTH DAILY 30 capsule 0  . SUMAtriptan (IMITREX) 100 MG tablet Take 1 tablet (100 mg total) by mouth once as needed. May repeat in 2 hours if headache persists or recurs. No more than 2x in one day. 10 tablet 12   No current facility-administered medications on file prior to visit.     BP 130/72 (BP Location: Left Arm, Patient Position: Sitting, Cuff Size: Normal)   Pulse 80   Temp 98.1 F (36.7 C) (Oral)   Ht 5' 5.5" (1.664 m)   Wt 207 lb 6.4 oz (94.1 kg)   SpO2 98%   BMI 33.99 kg/m     Review of Systems  Constitutional: Negative.   HENT: Negative for congestion, dental problem, hearing loss, rhinorrhea, sinus pressure, sore throat and tinnitus.   Eyes: Negative for pain, discharge and visual disturbance.  Respiratory: Negative for cough and shortness of breath.   Cardiovascular: Negative for chest pain, palpitations and leg swelling.  Gastrointestinal: Negative for abdominal distention, abdominal pain, blood in stool, constipation, diarrhea, nausea and  vomiting.  Genitourinary: Positive for frequency and urgency. Negative for difficulty urinating, dysuria, flank pain, hematuria, pelvic pain, vaginal bleeding, vaginal discharge and vaginal pain.  Musculoskeletal: Negative for arthralgias, gait problem and joint swelling.  Skin: Negative for rash.  Neurological: Negative for dizziness, syncope, speech difficulty, weakness, numbness and headaches.  Hematological: Negative for adenopathy.  Psychiatric/Behavioral: Negative for agitation, behavioral problems and dysphoric mood. The patient is not nervous/anxious.        Objective:   Physical Exam  Constitutional: She appears well-developed and well-nourished. No distress.  Abdominal: Soft.  Bowel sounds are normal. There is no tenderness.          Assessment & Plan:   UTI.  Will treat with Septra DS.  Force fluids Essential hypertension, stable.  Continue home blood pressure monitoring  Follow-up 6 months  KWIATKOWSKI,PETER Pilar Plate

## 2017-04-29 ENCOUNTER — Telehealth: Payer: Self-pay | Admitting: Internal Medicine

## 2017-04-29 MED ORDER — CIPROFLOXACIN HCL 500 MG PO TABS: 500.0000 mg | ORAL_TABLET | Freq: Two times a day (BID) | ORAL | 0 refills | Status: AC

## 2017-04-29 NOTE — Telephone Encounter (Signed)
Pt states the rx dr Raliegh Ip prescribed yesterday sulfamethoxazole-trimethoprim (BACTRIM DS,SEPTRA DS) 800-160 MG tablet  Is making her nauseas, very sick to her stomach. Pt states she is eating with it. Pt states she usually gets CIPRO, and that works for her.  Pt aware Dr Raliegh Ip is out of the office, but hopes someone else can address today.   [id number] - [place], [address] [place]

## 2017-04-29 NOTE — Telephone Encounter (Signed)
Patient would like to change abx from Bactrim to Cipro.  Dr. Raliegh Ip is not in office today.   Dr. Sarajane Jews - Please advise. Thanks!

## 2017-04-29 NOTE — Telephone Encounter (Signed)
Per Dr Sarajane Jews rx changed, pt advised, sent to pharmacy. Nothing further needed.

## 2017-04-29 NOTE — Telephone Encounter (Signed)
Please cancel the Bactrim and call in Cipro 500 mg bid for 7 days

## 2017-05-06 ENCOUNTER — Encounter: Payer: Self-pay | Admitting: Internal Medicine

## 2017-05-19 ENCOUNTER — Encounter: Payer: BLUE CROSS/BLUE SHIELD | Admitting: Family Medicine

## 2017-05-25 ENCOUNTER — Other Ambulatory Visit: Payer: Self-pay | Admitting: Internal Medicine

## 2017-05-26 ENCOUNTER — Other Ambulatory Visit: Payer: Self-pay | Admitting: Internal Medicine

## 2017-06-09 ENCOUNTER — Telehealth: Payer: Self-pay | Admitting: Internal Medicine

## 2017-06-09 NOTE — Telephone Encounter (Signed)
° ° ° ° °  Pt call to ask if she can have a RX for cough syrup Hydrocodone -homatropine

## 2017-06-09 NOTE — Telephone Encounter (Signed)
Please advise 

## 2017-06-10 MED ORDER — HYDROCODONE-HOMATROPINE 5-1.5 MG/5ML PO SYRP: 5.0000 mL | ORAL_SOLUTION | Freq: Three times a day (TID) | ORAL | 0 refills | Status: DC | PRN

## 2017-06-10 NOTE — Telephone Encounter (Signed)
Okay 6 ounces no refill

## 2017-06-10 NOTE — Telephone Encounter (Signed)
Rx printed awaiting to be signed.  

## 2017-06-10 NOTE — Addendum Note (Signed)
Addended by: Abelardo Diesel on: 06/10/2017 10:30 AM   Modules accepted: Orders

## 2017-06-11 NOTE — Telephone Encounter (Signed)
Detailed message was left stating that Rx is ready to be picked up at the front desk.

## 2017-06-25 ENCOUNTER — Other Ambulatory Visit: Payer: Self-pay | Admitting: Internal Medicine

## 2017-07-02 ENCOUNTER — Other Ambulatory Visit: Payer: Self-pay | Admitting: Internal Medicine

## 2017-07-02 NOTE — Telephone Encounter (Signed)
Review for refill. 

## 2017-07-02 NOTE — Telephone Encounter (Signed)
Refill request / LOV 04/28/2017 with Dr. Burnice Logan  Copied from [place] [phone]. Topic: Quick Communication - See Telephone Encounter >> Jul 02, 2017 12:34 PM Carolyn Stare wrote:  Pt req refill on promethazine (PHENERGAN) 25 MG tablet     [facility] [address]

## 2017-07-02 NOTE — Telephone Encounter (Signed)
See duplicate phone note dated for same.

## 2017-07-02 NOTE — Telephone Encounter (Unsigned)
Copied from [place] [id number]. Topic: Quick Communication - See Telephone Encounter >> Jul 02, 2017 12:34 PM Carolyn Stare wrote:  Pt req refill on promethazine (PHENERGAN) 25 MG tablet     [facility]

## 2017-07-05 ENCOUNTER — Other Ambulatory Visit: Payer: Self-pay | Admitting: Internal Medicine

## 2017-07-05 MED ORDER — PROMETHAZINE HCL 25 MG PO TABS: 25.0000 mg | ORAL_TABLET | Freq: Three times a day (TID) | ORAL | 0 refills | Status: DC | PRN

## 2017-07-05 NOTE — Telephone Encounter (Signed)
Medication was refilled.

## 2017-07-05 NOTE — Telephone Encounter (Signed)
Okay #20

## 2017-07-05 NOTE — Telephone Encounter (Signed)
Ok for refill? Please advise 

## 2017-07-06 ENCOUNTER — Telehealth: Payer: Self-pay | Admitting: Internal Medicine

## 2017-07-06 NOTE — Telephone Encounter (Signed)
Copied from [place] [id number]. Topic: Quick Communication - See Telephone Encounter >> Jul 02, 2017 12:34 PM Carolyn Stare wrote:  Pt req refill on promethazine (PHENERGAN) 25 MG tablet     [facility]  >> Jul 06, 2017  4:17 PM Neva Seat wrote: Pt req refill on promethazine (PHENERGAN) 25 MG tablet     [facility] [address]

## 2017-07-06 NOTE — Telephone Encounter (Signed)
Made pt aware that prescription had been refilled and sent to [facility] on [address]. Pt voiced no further concerns at this time.

## 2017-07-19 ENCOUNTER — Encounter: Payer: Self-pay | Admitting: Internal Medicine

## 2017-07-19 ENCOUNTER — Ambulatory Visit: Payer: BLUE CROSS/BLUE SHIELD | Admitting: Internal Medicine

## 2017-07-19 VITALS — BP 122/76 | HR 81 | Temp 98.2°F | Ht 65.5 in | Wt 210.8 lb

## 2017-07-19 DIAGNOSIS — I10 Essential (primary) hypertension: Secondary | ICD-10-CM | POA: Diagnosis not present

## 2017-07-19 DIAGNOSIS — J069 Acute upper respiratory infection, unspecified: Secondary | ICD-10-CM

## 2017-07-19 DIAGNOSIS — R197 Diarrhea, unspecified: Secondary | ICD-10-CM

## 2017-07-19 MED ORDER — HYDROCODONE-HOMATROPINE 5-1.5 MG/5ML PO SYRP: 5.0000 mL | ORAL_SOLUTION | Freq: Four times a day (QID) | ORAL | 0 refills | Status: DC | PRN

## 2017-07-19 NOTE — Progress Notes (Signed)
Subjective:    Patient ID: Rhonda [name], female    DOB: Feb 07, 1971, 46 y.o.   MRN: [id number]  HPI  46 year old patient who presents with a 2-week history of a sensation of hot and cold spells.  She was concerned about possible menopause.  She did have a bilateral nephrectomy in September of last year.  For the past week she has had some intermittent loose stools with a chief complaint of dry cough rhinorrhea and slight headache.  No documented fever  Past Medical History:  Diagnosis Date  . Anxiety   . C1q nephropathy   . Cancer [facility])    history of thyroid cancer  . Chronic constipation   . CKD (chronic kidney disease), stage II nephrologist-  dr Gretchen Short [facility])   secondary to C1q nephropathy/  hypertension  . Family history of adverse reaction to anesthesia    father hard to put down for anesthesia  . GERD (gastroesophageal reflux disease)   . H/O radioactive iodine thyroid ablation    1992  right throid  . History of exercise intolerance    01-052011  ETT--  normal w/ no ischemia per cardiologist (dr Aundra Dubin) epic note  . History of pulmonary embolus (PE)    07/ 2008-- treated w/ coumadin  . History of syncope    recurrent --  workup done per epic -- dx orthostatic hypotension  . Hypertension   . IBS (irritable bowel syndrome)   . Pelvic pain in female   . 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 Proteinuria   . Seasonal and perennial allergic rhinitis   . Wears contact lenses      Social History   Socioeconomic History  . Marital status: Married    Spouse name: Not on file  . Number of children: 2  . Years of education: Not on file  . Highest education level: Not on file  Social Needs  . Financial resource strain: Not on file  . Food insecurity - worry: Not on file  . Food insecurity - inability: Not on file  . Transportation needs - medical: Not on file  . Transportation needs - non-medical: Not on file    Occupational History  . Occupation: NURSING ASSISTANT    Employer: [employer]  Tobacco Use  . Smoking status: Never Smoker  . Smokeless tobacco: Never Used  Substance and Sexual Activity  . Alcohol use: No    Alcohol/week: 0.0 oz  . Drug use: No  . Sexual activity: Yes    Birth control/protection: Surgical  Other Topics Concern  . Not on file  Social History Narrative  . Not on file    Past Surgical History:  Procedure Laterality Date  . [place] STUDY N/A 11/16/2016   Procedure: [place] STUDY;  Surgeon: Doran Stabler, MD;  [facility];  Service: Gastroenterology;  Laterality: N/A;  . ABDOMINAL HYSTERECTOMY  1998  . CYSTOSCOPY N/A 04/28/2016   Procedure: CYSTOSCOPY;  Surgeon: Arvella Nigh, MD;  Location: [facility];  Service: Gynecology;  Laterality: N/A;  . ESOPHAGEAL MANOMETRY N/A 11/16/2016   Procedure: ESOPHAGEAL MANOMETRY (EM);  Surgeon: Doran Stabler, MD;  [facility];  Service: Gastroenterology;  Laterality: N/A;  . ESOPHAGOGASTRODUODENOSCOPY    . LAPAROSCOPIC CHOLECYSTECTOMY  1997  . LAPAROSCOPY N/A 03/26/2016   Procedure: LAPAROSCOPY DIAGNOSTIC, LYSIS OF ADHESIONS;  Surgeon: Arvella Nigh, MD;  Location: [place];  Service: Gynecology;  Laterality: N/A;  .  LAPAROTOMY N/A 04/28/2016   Procedure: EXPLORATORY LAPAROTOMY;  Surgeon: Arvella Nigh, MD;  Location: [facility];  Service: Gynecology;  Laterality: N/A;  . RENAL BIOPSY    . SALPINGOOPHORECTOMY Bilateral 04/28/2016   Procedure: Bilateral OOPHORECTOMY;  Surgeon: Arvella Nigh, MD;  Location: [facility] [facility];  Service: Gynecology;  Laterality: Bilateral;  . THYROIDECTOMY Left 1991  . TRANSTHORACIC ECHOCARDIOGRAM  04/27/2012   normal LV, ef 60-65%/  trivial MR  . TUBAL LIGATION  1992    Family History  Problem Relation Age of Onset  . Hypertension Father   . Coronary artery disease Father   . Diabetes Father   . Stroke Father   . Prostate cancer Father   . Diabetes Mother         Pre-diabetic  . Hypertension Mother   . Breast cancer Maternal Aunt   . Colon cancer Neg Hx   . Colon polyps Neg Hx   . Kidney disease Neg Hx   . Esophageal cancer Neg Hx   . Gallbladder disease Neg Hx     Allergies  Allergen Reactions  . Aspirin Hives  . Flagyl [Metronidazole] Nausea And Vomiting    With oral flagyl.   Has tolerated vaginal flagyl.    . Food Other (See Comments)    Lima beans--  Positive allergy test   . Tramadol Other (See Comments)    Headache   . Dilaudid [Hydromorphone] Itching    Current Outpatient Medications on File Prior to Visit  Medication Sig Dispense Refill  . albuterol (PROVENTIL HFA;VENTOLIN HFA) 108 (90 Base) MCG/ACT inhaler Inhale 2 puffs into the lungs every 6 (six) hours as needed for wheezing or shortness of breath. 1 Inhaler 5  . ALPRAZolam (XANAX) 0.25 MG tablet Take 1 tablet (0.25 mg total) by mouth 2 (two) times daily as needed for anxiety. 60 tablet 0  . amLODipine (NORVASC) 5 MG tablet TAKE 1 TABLET BY MOUTH EVERY DAY 30 tablet 5  . HYDROcodone-homatropine (HYCODAN) 5-1.5 MG/5ML syrup Take 5 mLs by mouth every 8 (eight) hours as needed for cough. 180 mL 0  . levothyroxine (SYNTHROID, LEVOTHROID) 150 MCG tablet TAKE 1 TABLET BY MOUTH EVERY DAY BEFORE BREAKFAST 30 tablet 0  . metoprolol succinate (TOPROL-XL) 50 MG 24 hr tablet TAKE 1 TABLET BY MOUTH EVERY DAY IMMEDIATELY FOLLOWING A MEAL 30 tablet 5  . metoprolol succinate (TOPROL-XL) 50 MG 24 hr tablet TAKE 1 TABLET BY MOUTH EVERY DAY IMMEDIATELY FOLLOWING A MEAL 30 tablet 0  . olmesartan (BENICAR) 40 MG tablet Take 1 tablet (40 mg total) by mouth daily.    Marland Kitchen omeprazole (PRILOSEC) 40 MG capsule TAKE 1 CAPSULE BY MOUTH EVERY DAY 30 capsule 0  . promethazine (PHENERGAN) 25 MG tablet Take 1 tablet (25 mg total) every 8 (eight) hours as needed by mouth for nausea or vomiting. 20 tablet 0  . SUMAtriptan (IMITREX) 100 MG tablet Take 1 tablet (100 mg total) by mouth once as needed. May repeat in  2 hours if headache persists or recurs. No more than 2x in one day. 10 tablet 12   No current facility-administered medications on file prior to visit.     BP 122/76 (BP Location: Left Arm, Patient Position: Sitting, Cuff Size: Normal)   Pulse 81   Temp 98.2 F (36.8 C) (Oral)   Ht 5' 5.5" (1.664 m)   Wt 210 lb 12.8 oz (95.6 kg)   SpO2 98%   BMI 34.55 kg/m     Review of Systems  Constitutional:  Positive for activity change, chills and fatigue.  HENT: Negative for congestion, dental problem, hearing loss, rhinorrhea, sinus pressure, sore throat and tinnitus.   Eyes: Negative for pain, discharge and visual disturbance.  Respiratory: Positive for cough. Negative for shortness of breath.   Cardiovascular: Negative for chest pain, palpitations and leg swelling.  Gastrointestinal: Positive for diarrhea. Negative for abdominal distention, abdominal pain, blood in stool, constipation, nausea and vomiting.  Genitourinary: Negative for difficulty urinating, dysuria, flank pain, frequency, hematuria, pelvic pain, urgency, vaginal bleeding, vaginal discharge and vaginal pain.  Musculoskeletal: Negative for arthralgias, gait problem and joint swelling.  Skin: Negative for rash.  Neurological: Negative for dizziness, syncope, speech difficulty, weakness, numbness and headaches.  Hematological: Negative for adenopathy.  Psychiatric/Behavioral: Negative for agitation, behavioral problems and dysphoric mood. The patient is not nervous/anxious.        Objective:   Physical Exam  Constitutional: She is oriented to person, place, and time. She appears well-developed and well-nourished.  HENT:  Head: Normocephalic.  Right Ear: External ear normal.  Left Ear: External ear normal.  Mouth/Throat: Oropharynx is clear and moist.  Eyes: Conjunctivae and EOM are normal. Pupils are equal, round, and reactive to light.  Neck: Normal range of motion. Neck supple. No thyromegaly present.  Cardiovascular:  Normal rate, regular rhythm, normal heart sounds and intact distal pulses.  Pulmonary/Chest: Effort normal and breath sounds normal. She has no wheezes. She has no rales.  Abdominal: Soft. Bowel sounds are normal. She exhibits no mass. There is no tenderness.  Musculoskeletal: Normal range of motion.  Lymphadenopathy:    She has no cervical adenopathy.  Neurological: She is alert and oriented to person, place, and time.  Skin: Skin is warm and dry. No rash noted.  Psychiatric: She has a normal mood and affect. Her behavior is normal.          Assessment & Plan:   Viral URI with cough.  Will treat symptomatically Intermittent diarrhea Surgical menopause History of hypertension.  Well-controlled  We will treat symptomatically Force fluids  Nyoka Cowden

## 2017-07-19 NOTE — Patient Instructions (Addendum)
Acute bronchitis symptoms are generally not helped by antibiotics.  Take over-the-counter expectorants and cough medications such as  Mucinex DM.  Call if there is no improvement in 5 to 7 days or if  you develop worsening cough, fever, or new symptoms, such as shortness of breath or chest pain.  Hydrate and Humidify  Drink enough water to keep your urine clear or pale yellow. Staying hydrated will help to thin your mucus.  Use a cool mist humidifier to keep the humidity level in your home above 50%.  Inhale steam for 10-15 minutes, 3-4 times a day or as told by your health care provider. You can do this in the bathroom while a hot shower is running.  Limit your exposure to cool or dry air. Rest  Rest as much as possible.  

## 2017-07-22 ENCOUNTER — Other Ambulatory Visit: Payer: Self-pay | Admitting: Internal Medicine

## 2017-08-20 ENCOUNTER — Telehealth: Payer: Self-pay | Admitting: Internal Medicine

## 2017-08-20 ENCOUNTER — Other Ambulatory Visit: Payer: Self-pay | Admitting: Internal Medicine

## 2017-08-20 MED ORDER — PROMETHAZINE HCL 25 MG PO TABS: 25.0000 mg | ORAL_TABLET | Freq: Three times a day (TID) | ORAL | 0 refills | Status: DC | PRN

## 2017-08-20 NOTE — Telephone Encounter (Signed)
promethazine was e-scribed to the pharmacy.

## 2017-08-20 NOTE — Telephone Encounter (Signed)
Pt was called and made aware

## 2017-08-20 NOTE — Telephone Encounter (Signed)
Copied from [place] [id number]. Topic: Quick Communication - See Telephone Encounter >> Aug 20, 2017  3:00 PM Percell Belt A wrote: CRM for notification. See Telephone encounter for: pt called in and needs a refill on her promethazine (PHENERGAN) 25 MG tablet [[id number]]   [facility]acy - [facility] [address]   08/20/17.

## 2017-08-20 NOTE — Telephone Encounter (Signed)
Request for antiemetic

## 2017-10-14 ENCOUNTER — Other Ambulatory Visit: Payer: Self-pay | Admitting: Internal Medicine

## 2017-10-14 NOTE — Telephone Encounter (Signed)
Copied from [place]. Topic: Quick Communication - Rx Refill/Question >> Oct 14, 2017  3:17 PM Ahmed Prima L wrote: Medication:  promethazine (PHENERGAN) 25 MG tablet  Has the patient contacted their pharmacy? yes   (Agent: If no, request that the patient contact the pharmacy for the refill.)   Preferred Pharmacy (with phone number or street name): [id number] - [place], [address]   Agent: Please be advised that RX refills may take up to 3 business days. We ask that you follow-up with your pharmacy.

## 2017-10-15 NOTE — Telephone Encounter (Signed)
LOV: 07/19/17  PCP: Dr. Burnice Logan  Pharmacy: [facility]  [address]

## 2017-10-18 ENCOUNTER — Other Ambulatory Visit: Payer: Self-pay | Admitting: Internal Medicine

## 2017-10-18 NOTE — Telephone Encounter (Signed)
Dr. Sherren Mocha, Please advise refill in PCP's absence.

## 2017-10-19 MED ORDER — PROMETHAZINE HCL 25 MG PO TABS: 25.0000 mg | ORAL_TABLET | Freq: Three times a day (TID) | ORAL | 0 refills | Status: DC | PRN

## 2017-11-25 ENCOUNTER — Other Ambulatory Visit: Payer: Self-pay | Admitting: Internal Medicine

## 2017-11-25 DIAGNOSIS — Z139 Encounter for screening, unspecified: Secondary | ICD-10-CM

## 2017-12-08 ENCOUNTER — Encounter: Payer: Self-pay | Admitting: Internal Medicine

## 2017-12-08 ENCOUNTER — Ambulatory Visit: Payer: No Typology Code available for payment source | Admitting: Internal Medicine

## 2017-12-08 VITALS — BP 100/70 | HR 95 | Temp 98.5°F | Wt 218.0 lb

## 2017-12-08 DIAGNOSIS — J301 Allergic rhinitis due to pollen: Secondary | ICD-10-CM | POA: Diagnosis not present

## 2017-12-08 DIAGNOSIS — I10 Essential (primary) hypertension: Secondary | ICD-10-CM | POA: Diagnosis not present

## 2017-12-08 DIAGNOSIS — E039 Hypothyroidism, unspecified: Secondary | ICD-10-CM

## 2017-12-08 MED ORDER — PROMETHAZINE HCL 25 MG PO TABS: 25.0000 mg | ORAL_TABLET | Freq: Three times a day (TID) | ORAL | 0 refills | Status: DC | PRN

## 2017-12-08 MED ORDER — AMLODIPINE BESYLATE 10 MG PO TABS: 10.0000 mg | ORAL_TABLET | Freq: Every day | ORAL | 4 refills | Status: DC

## 2017-12-08 MED ORDER — OLMESARTAN MEDOXOMIL-HCTZ 40-25 MG PO TABS: 1.0000 | ORAL_TABLET | Freq: Every day | ORAL | 3 refills | Status: DC

## 2017-12-08 NOTE — Patient Instructions (Signed)
Limit your sodium (Salt) intake  Please check your blood pressure on a regular basis.  If it is consistently greater than 150/90, please make an office appointment.  Return in 6 months for follow-up   

## 2017-12-08 NOTE — Progress Notes (Signed)
Subjective:    Patient ID: Rhonda [name], female    DOB: 1971/06/01, 47 y.o.   MRN: [id number]  HPI  47 year old patient who has a history of essential hypertension and stable chronic kidney disease secondary to C1q nephropathy.  She is followed by nephrology and recently has had hydrochlorothiazide added to her regimen and amlodipine increased to 10 mg daily.  Blood pressures had a is in a low normal range. She feels well without concerns or complaints   Past Medical History:  Diagnosis Date  . Anxiety   . C1q nephropathy   . Cancer [facility])    history of thyroid cancer  . Chronic constipation   . CKD (chronic kidney disease), stage II nephrologist-  dr Gretchen Short [facility])   secondary to C1q nephropathy/  hypertension  . Family history of adverse reaction to anesthesia    father hard to put down for anesthesia  . GERD (gastroesophageal reflux disease)   . H/O radioactive iodine thyroid ablation    1992  right throid  . History of exercise intolerance    01-052011  ETT--  normal w/ no ischemia per cardiologist (dr Aundra Dubin) epic note  . History of pulmonary embolus (PE)    07/ 2008-- treated w/ coumadin  . History of syncope    recurrent --  workup done per epic -- dx orthostatic hypotension  . Hypertension   . IBS (irritable bowel syndrome)   . Pelvic pain in female   . 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 Proteinuria   . Seasonal and perennial allergic rhinitis   . Wears contact lenses      Social History   Socioeconomic History  . Marital status: Married    Spouse name: Not on file  . Number of children: 2  . Years of education: Not on file  . Highest education level: Not on file  Occupational History  . Occupation: NURSING ASSISTANT    Employer: [employer]  . Financial resource strain: Not on file  . Food insecurity:    Worry: Not on file    Inability: Not on file  . Transportation needs:     Medical: Not on file    Non-medical: Not on file  Tobacco Use  . Smoking status: Never Smoker  . Smokeless tobacco: Never Used  Substance and Sexual Activity  . Alcohol use: No    Alcohol/week: 0.0 oz  . Drug use: No  . Sexual activity: Yes    Birth control/protection: Surgical  Lifestyle  . Physical activity:    Days per week: Not on file    Minutes per session: Not on file  . Stress: Not on file  Relationships  . Social connections:    Talks on phone: Not on file    Gets together: Not on file    Attends religious service: Not on file    Active member of club or organization: Not on file    Attends meetings of clubs or organizations: Not on file    Relationship status: Not on file  . Intimate partner violence:    Fear of current or ex partner: Not on file    Emotionally abused: Not on file    Physically abused: Not on file    Forced sexual activity: Not on file  Other Topics Concern  . Not on file  Social History Narrative  . Not on file    Past Surgical History:  Procedure Laterality Date  .  [place] STUDY N/A 11/16/2016   Procedure: [place] STUDY;  Surgeon: Doran Stabler, MD;  [facility];  Service: Gastroenterology;  Laterality: N/A;  . ABDOMINAL HYSTERECTOMY  1998  . CYSTOSCOPY N/A 04/28/2016   Procedure: CYSTOSCOPY;  Surgeon: Arvella Nigh, MD;  Location: [facility];  Service: Gynecology;  Laterality: N/A;  . ESOPHAGEAL MANOMETRY N/A 11/16/2016   Procedure: ESOPHAGEAL MANOMETRY (EM);  Surgeon: Doran Stabler, MD;  [facility];  Service: Gastroenterology;  Laterality: N/A;  . ESOPHAGOGASTRODUODENOSCOPY    . LAPAROSCOPIC CHOLECYSTECTOMY  1997  . LAPAROSCOPY N/A 03/26/2016   Procedure: LAPAROSCOPY DIAGNOSTIC, LYSIS OF ADHESIONS;  Surgeon: Arvella Nigh, MD;  Location: [place];  Service: Gynecology;  Laterality: N/A;  . LAPAROTOMY N/A 04/28/2016   Procedure: EXPLORATORY LAPAROTOMY;  Surgeon: Arvella Nigh, MD;  Location: [facility];  Service: Gynecology;  Laterality: N/A;  . RENAL BIOPSY    . SALPINGOOPHORECTOMY Bilateral 04/28/2016   Procedure: Bilateral OOPHORECTOMY;  Surgeon: Arvella Nigh, MD;  Location: [facility];  Service: Gynecology;  Laterality: Bilateral;  . THYROIDECTOMY Left 1991  . TRANSTHORACIC ECHOCARDIOGRAM  04/27/2012   normal LV, ef 60-65%/  trivial MR  . TUBAL LIGATION  1992    Family History  Problem Relation Age of Onset  . Hypertension Father   . Coronary artery disease Father   . Diabetes Father   . Stroke Father   . Prostate cancer Father   . Diabetes Mother        Pre-diabetic  . Hypertension Mother   . Breast cancer Maternal Aunt   . Colon cancer Neg Hx   . Colon polyps Neg Hx   . Kidney disease Neg Hx   . Esophageal cancer Neg Hx   . Gallbladder disease Neg Hx     Allergies  Allergen Reactions  . Aspirin Hives  . Flagyl [Metronidazole] Nausea And Vomiting    With oral flagyl.   Has tolerated vaginal flagyl.    . Food Other (See Comments)    Lima beans--  Positive allergy test   . Tramadol Other (See Comments)    Headache   . Dilaudid [Hydromorphone] Itching    Current Outpatient Medications on File Prior to Visit  Medication Sig Dispense Refill  . albuterol (PROVENTIL HFA;VENTOLIN HFA) 108 (90 Base) MCG/ACT inhaler Inhale 2 puffs into the lungs every 6 (six) hours as needed for wheezing or shortness of breath. 1 Inhaler 5  . ALPRAZolam (XANAX) 0.25 MG tablet Take 1 tablet (0.25 mg total) by mouth 2 (two) times daily as needed for anxiety. 60 tablet 0  . levothyroxine (SYNTHROID, LEVOTHROID) 150 MCG tablet TAKE 1 TABLET BY MOUTH EVERY DAY BEFORE BREAKFAST 90 tablet 2  . metoprolol succinate (TOPROL-XL) 50 MG 24 hr tablet TAKE 1 TABLET BY MOUTH EVERY DAY IMMEDIATELY FOLLOWING A MEAL 90 tablet 2  . omeprazole (PRILOSEC) 40 MG capsule TAKE 1 CAPSULE BY MOUTH DAILY 90 capsule 2  . SUMAtriptan (IMITREX) 100 MG tablet Take 1 tablet (100 mg total) by mouth once as needed. May  repeat in 2 hours if headache persists or recurs. No more than 2x in one day. 10 tablet 12   No current facility-administered medications on file prior to visit.     BP 100/70 (BP Location: Right Arm, Patient Position: Sitting, Cuff Size: Large)   Pulse 95   Temp 98.5 F (36.9 C) (Oral)   Wt 218 lb (98.9 kg)   SpO2 98%  BMI 35.73 kg/m     Review of Systems  Constitutional: Negative.   HENT: Negative for congestion, dental problem, hearing loss, rhinorrhea, sinus pressure, sore throat and tinnitus.   Eyes: Negative for pain, discharge and visual disturbance.  Respiratory: Negative for cough and shortness of breath.   Cardiovascular: Negative for chest pain, palpitations and leg swelling.  Gastrointestinal: Negative for abdominal distention, abdominal pain, blood in stool, constipation, diarrhea, nausea and vomiting.  Genitourinary: Negative for difficulty urinating, dysuria, flank pain, frequency, hematuria, pelvic pain, urgency, vaginal bleeding, vaginal discharge and vaginal pain.  Musculoskeletal: Negative for arthralgias, gait problem and joint swelling.  Skin: Negative for rash.  Neurological: Negative for dizziness, syncope, speech difficulty, weakness, numbness and headaches.  Hematological: Negative for adenopathy.  Psychiatric/Behavioral: Negative for agitation, behavioral problems and dysphoric mood. The patient is not nervous/anxious.        Objective:   Physical Exam  Constitutional: She is oriented to person, place, and time. She appears well-developed and well-nourished.  Blood pressure 100/60  HENT:  Head: Normocephalic.  Right Ear: External ear normal.  Left Ear: External ear normal.  Mouth/Throat: Oropharynx is clear and moist.  Eyes: Pupils are equal, round, and reactive to light. Conjunctivae and EOM are normal.  Neck: Normal range of motion. Neck supple. No thyromegaly present.  Cardiovascular: Normal rate, regular rhythm, normal heart sounds and intact  distal pulses.  Pulmonary/Chest: Effort normal and breath sounds normal.  Abdominal: Soft. Bowel sounds are normal. She exhibits no mass. There is no tenderness.  Musculoskeletal: Normal range of motion.  Lymphadenopathy:    She has no cervical adenopathy.  Neurological: She is alert and oriented to person, place, and time.  Skin: Skin is warm and dry. No rash noted.  Psychiatric: She has a normal mood and affect. Her behavior is normal.          Assessment & Plan:   Essential hypertension Chronic kidney disease secondary to C1q nephropathy stable  Follow with nephrology Follow-up here 6 months or as needed Medications updated  Nyoka Cowden

## 2017-12-16 ENCOUNTER — Ambulatory Visit
Admission: RE | Admit: 2017-12-16 | Discharge: 2017-12-16 | Disposition: A | Discharge status: Home / Self Care | Payer: No Typology Code available for payment source | Source: Ambulatory Visit | Attending: Internal Medicine | Admitting: Internal Medicine

## 2017-12-16 DIAGNOSIS — Z139 Encounter for screening, unspecified: Secondary | ICD-10-CM

## 2017-12-20 ENCOUNTER — Telehealth: Payer: Self-pay | Admitting: *Deleted

## 2017-12-20 NOTE — Telephone Encounter (Signed)
Fax received from [facility] stating a prior Josem Kaufmann was needed for Olmesartan Medoxomil HCTZ 40-25mg .  I called [facility] at [phone] and per Butch Penny a prior Josem Kaufmann is not needed as it too early to fill the Rx as this was filled on 4/17 by a [facility] [address]

## 2018-01-20 ENCOUNTER — Other Ambulatory Visit: Payer: Self-pay | Admitting: Internal Medicine

## 2018-01-20 NOTE — Telephone Encounter (Signed)
Phenergan refill Last Refill:12/08/17 #20 Last OV: 12/08/17 PCP: Dr. Inda Merlin [facility]     [phone] [address]

## 2018-01-20 NOTE — Telephone Encounter (Signed)
Copied from [place] [id number]. Topic: Quick Communication - Rx Refill/Question >> Jan 20, 2018 11:36 AM Yvette Rack wrote: Medication: promethazine (PHENERGAN) 25 MG tablet  Has the patient contacted their pharmacy? Yes.   (Agent: If no, request that the patient contact the pharmacy for the refill.) (Agent: If yes, when and what did the pharmacy advise?)  Preferred Pharmacy (with phone number or street name):     [facility] - [place], [address] AT [address] [phone] (Phone) [phone] (Fax)      Agent: Please be advised that RX refills may take up to 3 business days. We ask that you follow-up with your pharmacy.

## 2018-02-07 ENCOUNTER — Telehealth: Payer: Self-pay | Admitting: Internal Medicine

## 2018-02-07 NOTE — Telephone Encounter (Signed)
Copied from [place] [id number]. Topic: General - Other >> Feb 07, 2018  2:24 PM Lennox Solders wrote: Reason for HID:[id number] pharm in [place] is calling the olmesartan-hctz 40-20 is on back order until mid July. Pharm would like md to can medications in separately

## 2018-02-09 NOTE — Telephone Encounter (Signed)
Okay to send this separate? If so can you please specify what you would like me to send in? Please advise

## 2018-02-09 NOTE — Telephone Encounter (Signed)
Olmesartan 40 mg #90 1 tablet  daily Hydrochlorothiazide 25 mg   #90  1 tablet daily

## 2018-02-10 MED ORDER — HYDROCHLOROTHIAZIDE 25 MG PO TABS: ORAL_TABLET | ORAL | 3 refills | Status: DC

## 2018-02-10 MED ORDER — OLMESARTAN MEDOXOMIL 40 MG PO TABS: ORAL_TABLET | ORAL | 0 refills | Status: DC

## 2018-02-10 NOTE — Addendum Note (Signed)
Addended by: Gwenyth Ober R on: 02/10/2018 04:15 PM   Modules accepted: Orders

## 2018-02-10 NOTE — Telephone Encounter (Signed)
Rx sent in to pharmacy. 

## 2018-02-28 ENCOUNTER — Encounter: Payer: Self-pay | Admitting: Internal Medicine

## 2018-02-28 ENCOUNTER — Ambulatory Visit: Payer: No Typology Code available for payment source | Admitting: Internal Medicine

## 2018-02-28 VITALS — BP 100/60 | HR 91 | Temp 98.6°F | Wt 223.2 lb

## 2018-02-28 DIAGNOSIS — R202 Paresthesia of skin: Secondary | ICD-10-CM

## 2018-02-28 DIAGNOSIS — I10 Essential (primary) hypertension: Secondary | ICD-10-CM | POA: Diagnosis not present

## 2018-02-28 MED ORDER — HYDROCODONE-ACETAMINOPHEN 5-325 MG PO TABS: 1.0000 | ORAL_TABLET | Freq: Four times a day (QID) | ORAL | 0 refills | Status: AC | PRN

## 2018-02-28 MED ORDER — PREDNISONE 20 MG PO TABS: 20.0000 mg | ORAL_TABLET | Freq: Two times a day (BID) | ORAL | 0 refills | Status: DC

## 2018-02-28 NOTE — Patient Instructions (Signed)
Most patients with low back pain will improve with time over the next two to 6 weeks.  Keep active but avoid any activities that cause pain.  Apply moist heat to the low back area several times daily.  Call or return to clinic prn if these symptoms worsen or fail to improve as anticipated.  

## 2018-02-28 NOTE — Progress Notes (Signed)
Subjective:    Patient ID: Rhonda [name], female    DOB: 30-Sep-1970, 47 y.o.   MRN: [id number]  HPI  47 year old patient who is seen today with a 2 to 3-week history of burning involving the legs and feet.  She describes a tingling sensation.  She also describes earlier some twitching of the left eye.  Presently she is having more discomfort in the flank area and the paresthesias have resolved.  She states Friday was a very difficult day but was able to get through her workday.  She has improved over the weekend.  At the present time she describes only some mild discomfort in the back and flank area.  Past Medical History:  Diagnosis Date  . Anxiety   . C1q nephropathy   . Cancer [facility])    history of thyroid cancer  . Chronic constipation   . CKD (chronic kidney disease), stage II nephrologist-  dr Gretchen Short [facility])   secondary to C1q nephropathy/  hypertension  . Family history of adverse reaction to anesthesia    father hard to put down for anesthesia  . GERD (gastroesophageal reflux disease)   . H/O radioactive iodine thyroid ablation    1992  right throid  . History of exercise intolerance    01-052011  ETT--  normal w/ no ischemia per cardiologist (dr Aundra Dubin) epic note  . History of pulmonary embolus (PE)    07/ 2008-- treated w/ coumadin  . History of syncope    recurrent --  workup done per epic -- dx orthostatic hypotension  . Hypertension   . IBS (irritable bowel syndrome)   . Pelvic pain in female   . 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 Proteinuria   . Seasonal and perennial allergic rhinitis   . Wears contact lenses      Social History   Socioeconomic History  . Marital status: Married    Spouse name: Not on file  . Number of children: 2  . Years of education: Not on file  . Highest education level: Not on file  Occupational History  . Occupation: NURSING ASSISTANT    Employer: [employer]  . Financial resource strain: Not on file  . Food insecurity:    Worry: Not on file    Inability: Not on file  . Transportation needs:    Medical: Not on file    Non-medical: Not on file  Tobacco Use  . Smoking status: Never Smoker  . Smokeless tobacco: Never Used  Substance and Sexual Activity  . Alcohol use: No    Alcohol/week: 0.0 oz  . Drug use: No  . Sexual activity: Yes    Birth control/protection: Surgical  Lifestyle  . Physical activity:    Days per week: Not on file    Minutes per session: Not on file  . Stress: Not on file  Relationships  . Social connections:    Talks on phone: Not on file    Gets together: Not on file    Attends religious service: Not on file    Active member of club or organization: Not on file    Attends meetings of clubs or organizations: Not on file    Relationship status: Not on file  . Intimate partner violence:    Fear of current or ex partner: Not on file    Emotionally abused: Not on file    Physically abused: Not on file  Forced sexual activity: Not on file  Other Topics Concern  . Not on file  Social History Narrative  . Not on file    Past Surgical History:  Procedure Laterality Date  . [place] STUDY N/A 11/16/2016   Procedure: [place] STUDY;  Surgeon: Doran Stabler, MD;  Location: [facility];  Service: Gastroenterology;  Laterality: N/A;  . ABDOMINAL HYSTERECTOMY  1998  . CYSTOSCOPY N/A 04/28/2016   Procedure: CYSTOSCOPY;  Surgeon: Arvella Nigh, MD;  Location: [facility];  Service: Gynecology;  Laterality: N/A;  . ESOPHAGEAL MANOMETRY N/A 11/16/2016   Procedure: ESOPHAGEAL MANOMETRY (EM);  Surgeon: Doran Stabler, MD;  Location: [facility];  Service: Gastroenterology;  Laterality: N/A;  . ESOPHAGOGASTRODUODENOSCOPY    . LAPAROSCOPIC CHOLECYSTECTOMY  1997  . LAPAROSCOPY N/A 03/26/2016   Procedure: LAPAROSCOPY DIAGNOSTIC, LYSIS OF ADHESIONS;  Surgeon: Arvella Nigh, MD;  Location: [place];  Service: Gynecology;  Laterality: N/A;  . LAPAROTOMY N/A 04/28/2016   Procedure: EXPLORATORY LAPAROTOMY;  Surgeon: Arvella Nigh, MD;  Location: [facility];  Service: Gynecology;  Laterality: N/A;  . RENAL BIOPSY    . SALPINGOOPHORECTOMY Bilateral 04/28/2016   Procedure: Bilateral OOPHORECTOMY;  Surgeon: Arvella Nigh, MD;  Location: [facility];  Service: Gynecology;  Laterality: Bilateral;  . THYROIDECTOMY Left 1991  . TRANSTHORACIC ECHOCARDIOGRAM  04/27/2012   normal LV, ef 60-65%/  trivial MR  . TUBAL LIGATION  1992    Family History  Problem Relation Age of Onset  . Hypertension Father   . Coronary artery disease Father   . Diabetes Father   . Stroke Father   . Prostate cancer Father   . Diabetes Mother        Pre-diabetic  . Hypertension Mother   . Breast cancer Maternal Aunt   . Colon cancer Neg Hx   . Colon polyps Neg Hx   . Kidney disease Neg Hx   . Esophageal cancer Neg Hx   . Gallbladder disease Neg Hx     Allergies  Allergen Reactions  . Aspirin Hives  . Flagyl [Metronidazole] Nausea And Vomiting    With oral flagyl.   Has tolerated vaginal flagyl.    . Food Other (See Comments)    Lima beans--  Positive allergy test   . Tramadol Other (See Comments)    Headache   . Dilaudid [Hydromorphone] Itching    Current Outpatient Medications on File Prior to Visit  Medication Sig Dispense Refill  . albuterol (PROVENTIL HFA;VENTOLIN HFA) 108 (90 Base) MCG/ACT inhaler Inhale 2 puffs into the lungs every 6 (six) hours as needed for wheezing or shortness of breath. 1 Inhaler 5  . ALPRAZolam (XANAX) 0.25 MG tablet Take 1 tablet (0.25 mg total) by mouth 2 (two) times daily as needed for anxiety. 60 tablet 0  . amLODipine (NORVASC) 10 MG tablet Take 1 tablet (10 mg total) by mouth daily. 90 tablet 4  . hydrochlorothiazide (HYDRODIURIL) 25 MG tablet Take 1 tablet a day. 90 tablet 3  . levothyroxine (SYNTHROID, LEVOTHROID) 150 MCG tablet TAKE 1 TABLET BY MOUTH EVERY DAY  BEFORE BREAKFAST 90 tablet 2  . metoprolol succinate (TOPROL-XL) 50 MG 24 hr tablet TAKE 1 TABLET BY MOUTH EVERY DAY IMMEDIATELY FOLLOWING A MEAL 90 tablet 2  . olmesartan (BENICAR) 40 MG tablet Take 1 tablet a day. 90 tablet 0  . olmesartan-hydrochlorothiazide (BENICAR HCT) 40-25 MG tablet Take 1 tablet by mouth daily. 90 tablet 3  . omeprazole (PRILOSEC)  40 MG capsule TAKE 1 CAPSULE BY MOUTH DAILY 90 capsule 2  . promethazine (PHENERGAN) 25 MG tablet TAKE 1 TABLET(25 MG) BY MOUTH EVERY 8 HOURS AS NEEDED FOR NAUSEA OR VOMITING 20 tablet 0  . SUMAtriptan (IMITREX) 100 MG tablet Take 1 tablet (100 mg total) by mouth once as needed. May repeat in 2 hours if headache persists or recurs. No more than 2x in one day. 10 tablet 12   No current facility-administered medications on file prior to visit.     BP 100/60 (BP Location: Right Arm, Patient Position: Sitting, Cuff Size: Large)   Pulse 91   Temp 98.6 F (37 C) (Oral)   Wt 223 lb 3.2 oz (101.2 kg)   SpO2 95%   BMI 36.58 kg/m     Review of Systems  Constitutional: Negative.   HENT: Negative for congestion, dental problem, hearing loss, rhinorrhea, sinus pressure, sore throat and tinnitus.   Eyes: Negative for pain, discharge and visual disturbance.       Left eye twitching  Respiratory: Negative for cough and shortness of breath.   Cardiovascular: Negative for chest pain, palpitations and leg swelling.  Gastrointestinal: Negative for abdominal distention, abdominal pain, blood in stool, constipation, diarrhea, nausea and vomiting.  Genitourinary: Positive for flank pain. Negative for difficulty urinating, dysuria, frequency, hematuria, pelvic pain, urgency, vaginal bleeding, vaginal discharge and vaginal pain.  Musculoskeletal: Positive for back pain. Negative for arthralgias, gait problem and joint swelling.  Skin: Negative for rash.  Neurological: Positive for numbness. Negative for dizziness, syncope, speech difficulty, weakness and  headaches.  Hematological: Negative for adenopathy.  Psychiatric/Behavioral: Negative for agitation, behavioral problems and dysphoric mood. The patient is not nervous/anxious.        Objective:   Physical Exam  Constitutional: She is oriented to person, place, and time. She appears well-developed and well-nourished.  HENT:  Head: Normocephalic.  Right Ear: External ear normal.  Left Ear: External ear normal.  Mouth/Throat: Oropharynx is clear and moist.  Eyes: Pupils are equal, round, and reactive to light. Conjunctivae and EOM are normal.  No blepharospasm appreciated  Neck: Normal range of motion. Neck supple. No thyromegaly present.  Cardiovascular: Normal rate, regular rhythm, normal heart sounds and intact distal pulses.  Pulmonary/Chest: Effort normal and breath sounds normal.  Abdominal: Soft. Bowel sounds are normal. She exhibits no mass. There is no tenderness.  Musculoskeletal: Normal range of motion.  Negative straight leg test Reflexes blunted but symmetrical No motor weakness  Lymphadenopathy:    She has no cervical adenopathy.  Neurological: She is alert and oriented to person, place, and time.  Skin: Skin is warm and dry. No rash noted.  Psychiatric: She has a normal mood and affect. Her behavior is normal.          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  She will be leaving on a family vacation in a couple of days We will continue symptomatic treatment and observation.  Patient report any clinical change or worsening  Marletta Lor

## 2018-03-11 ENCOUNTER — Ambulatory Visit: Payer: No Typology Code available for payment source | Admitting: Gastroenterology

## 2018-03-15 ENCOUNTER — Other Ambulatory Visit: Payer: Self-pay | Admitting: Internal Medicine

## 2018-03-22 ENCOUNTER — Ambulatory Visit: Payer: BLUE CROSS/BLUE SHIELD | Admitting: Neurology

## 2018-04-27 ENCOUNTER — Other Ambulatory Visit: Payer: Self-pay | Admitting: Internal Medicine

## 2018-05-04 ENCOUNTER — Ambulatory Visit: Payer: No Typology Code available for payment source | Admitting: Internal Medicine

## 2018-05-09 ENCOUNTER — Other Ambulatory Visit: Payer: Self-pay | Admitting: Internal Medicine

## 2018-05-26 ENCOUNTER — Ambulatory Visit: Payer: No Typology Code available for payment source | Admitting: Family Medicine

## 2018-05-31 ENCOUNTER — Encounter: Payer: Self-pay | Admitting: Family Medicine

## 2018-05-31 ENCOUNTER — Ambulatory Visit: Payer: No Typology Code available for payment source | Admitting: Family Medicine

## 2018-05-31 VITALS — BP 100/82 | HR 62 | Temp 98.3°F | Ht 65.5 in | Wt 220.3 lb

## 2018-05-31 DIAGNOSIS — Z23 Encounter for immunization: Secondary | ICD-10-CM | POA: Diagnosis not present

## 2018-05-31 DIAGNOSIS — I10 Essential (primary) hypertension: Secondary | ICD-10-CM | POA: Diagnosis not present

## 2018-05-31 DIAGNOSIS — E039 Hypothyroidism, unspecified: Secondary | ICD-10-CM | POA: Diagnosis not present

## 2018-05-31 DIAGNOSIS — E785 Hyperlipidemia, unspecified: Secondary | ICD-10-CM | POA: Diagnosis not present

## 2018-05-31 DIAGNOSIS — J302 Other seasonal allergic rhinitis: Secondary | ICD-10-CM | POA: Diagnosis not present

## 2018-05-31 LAB — CBC
HCT: 36.6 % (ref 36.0–46.0)
HEMOGLOBIN: 12.1 g/dL (ref 12.0–15.0)
MCHC: 33 g/dL (ref 30.0–36.0)
MCV: 84.3 fl (ref 78.0–100.0)
PLATELETS: 290 10*3/uL (ref 150.0–400.0)
RBC: 4.34 Mil/uL (ref 3.87–5.11)
RDW: 13.3 % (ref 11.5–15.5)
WBC: 6.6 10*3/uL (ref 4.0–10.5)

## 2018-05-31 LAB — BASIC METABOLIC PANEL
BUN: 24 mg/dL — ABNORMAL HIGH (ref 6–23)
CO2: 29 mEq/L (ref 19–32)
Calcium: 10.6 mg/dL — ABNORMAL HIGH (ref 8.4–10.5)
Chloride: 102 mEq/L (ref 96–112)
Creatinine, Ser: 1.17 mg/dL (ref 0.40–1.20)
GFR: 63.14 mL/min (ref >60.00)
Glucose, Bld: 85 mg/dL (ref 70–99)
POTASSIUM: 4.3 meq/L (ref 3.5–5.1)
SODIUM: 138 meq/L (ref 135–145)

## 2018-05-31 LAB — TSH: TSH: 0.01 u[IU]/mL — AB (ref 0.35–4.50)

## 2018-05-31 NOTE — Patient Instructions (Addendum)
BEFORE YOU LEAVE: -flu shot -labs -follow up: Transfer visit with Dr. Jerilee Hoh in 4 months   We recommend the following healthy lifestyle for LIFE: 1) Small portions. But, make sure to get regular (at least 3 per day), healthy meals and small healthy snacks if needed.  2) Eat a healthy clean diet.   TRY TO EAT: -at least 5-7 servings of low sugar, colorful, and nutrient rich vegetables per day (not corn, potatoes or bananas.) -berries are the best choice if you wish to eat fruit (only eat small amounts if trying to reduce weight)  -lean meets (fish, white meat of chicken or [place]) -vegan proteins for some meals - beans or tofu, whole grains, nuts and seeds -Replace bad fats with good fats - good fats include: fish, nuts and seeds, canola oil, olive oil -small amounts of low fat or non fat dairy -small amounts of100 % whole grains - check the lables -drink plenty of water  AVOID: -SUGAR, sweets, anything with added sugar, corn syrup or sweeteners - must read labels as even foods advertised as "healthy" often are loaded with sugar -if you must have a sweetener, small amounts of stevia may be best -sweetened beverages and artificially sweetened beverages -simple starches (rice, bread, potatoes, pasta, chips, etc - small amounts of 100% whole grains are ok) -red meat, pork, butter -fried foods, fast food, processed food, excessive dairy, eggs and coconut.  3)Get at least 150 minutes of sweaty aerobic exercise per week.  4)Reduce stress - consider counseling, meditation and relaxation to balance other aspects of your life.

## 2018-05-31 NOTE — Progress Notes (Signed)
HPI:  Using dictation device. Unfortunately this device frequently misinterprets words/phrases.  Prior patient of Dr. Raliegh Ip here for [facility]. Dr. Raliegh Ip has recently retired.  Does not look like I have seen her before.  Per review of chart she has a past medical history of anxiety, hypertension, hypothyroidism (history of thyroid cancer), chronic kidney disease, migraines and IBS. On brief review of chart, looks like she sees Dr. Moshe Cipro, nephrologist, for her lab work. Reports doing well except for seasonal allergies. Using zyrtec and nasal saline for this. Has some itchy dry skin in L ear at time. Plans to set up transfer visit to Dr. Jerilee Hoh. Wants flu shot today. Sees gyn. No reports cp, sob, fevers or other complaints.  ROS: See pertinent positives and negatives per HPI.  Past Medical History:  Diagnosis Date  . Anxiety   . C1q nephropathy   . Cancer [facility])    history of thyroid cancer  . Chronic constipation   . CKD (chronic kidney disease), stage II nephrologist-  dr Gretchen Short [facility])   secondary to C1q nephropathy/  hypertension  . Family history of adverse reaction to anesthesia    father hard to put down for anesthesia  . GERD (gastroesophageal reflux disease)   . H/O radioactive iodine thyroid ablation    1992  right throid  . History of exercise intolerance    01-052011  ETT--  normal w/ no ischemia per cardiologist (dr Aundra Dubin) epic note  . History of pulmonary embolus (PE)    07/ 2008-- treated w/ coumadin  . History of syncope    recurrent --  workup done per epic -- dx orthostatic hypotension  . Hypertension   . IBS (irritable bowel syndrome)   . Pelvic pain in female   . 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 Proteinuria   . Seasonal and perennial allergic rhinitis   . Wears contact lenses     Past Surgical History:  Procedure Laterality Date  . [place] STUDY N/A 11/16/2016   Procedure: [place] STUDY;   Surgeon: Doran Stabler, MD;  Location: [facility];  Service: Gastroenterology;  Laterality: N/A;  . ABDOMINAL HYSTERECTOMY  1998  . CYSTOSCOPY N/A 04/28/2016   Procedure: CYSTOSCOPY;  Surgeon: Arvella Nigh, MD;  Location: [facility];  Service: Gynecology;  Laterality: N/A;  . ESOPHAGEAL MANOMETRY N/A 11/16/2016   Procedure: ESOPHAGEAL MANOMETRY (EM);  Surgeon: Doran Stabler, MD;  [facility];  Service: Gastroenterology;  Laterality: N/A;  . ESOPHAGOGASTRODUODENOSCOPY    . LAPAROSCOPIC CHOLECYSTECTOMY  1997  . LAPAROSCOPY N/A 03/26/2016   Procedure: LAPAROSCOPY DIAGNOSTIC, LYSIS OF ADHESIONS;  Surgeon: Arvella Nigh, MD;  Location: [place];  Service: Gynecology;  Laterality: N/A;  . LAPAROTOMY N/A 04/28/2016   Procedure: EXPLORATORY LAPAROTOMY;  Surgeon: Arvella Nigh, MD;  Location: [facility];  Service: Gynecology;  Laterality: N/A;  . RENAL BIOPSY    . SALPINGOOPHORECTOMY Bilateral 04/28/2016   Procedure: Bilateral OOPHORECTOMY;  Surgeon: Arvella Nigh, MD;  Location: [facility];  Service: Gynecology;  Laterality: Bilateral;  . THYROIDECTOMY Left 1991  . TRANSTHORACIC ECHOCARDIOGRAM  04/27/2012   normal LV, ef 60-65%/  trivial MR  . TUBAL LIGATION  1992    Family History  Problem Relation Age of Onset  . Hypertension Father   . Coronary artery disease Father   . Diabetes Father   . Stroke Father   . Prostate cancer Father   . Diabetes Mother  Pre-diabetic  . Hypertension Mother   . Breast cancer Maternal Aunt   . Colon cancer Neg Hx   . Colon polyps Neg Hx   . Kidney disease Neg Hx   . Esophageal cancer Neg Hx   . Gallbladder disease Neg Hx     SOCIAL HX: see hpi   Current Outpatient Medications:  .  albuterol (PROVENTIL HFA;VENTOLIN HFA) 108 (90 Base) MCG/ACT inhaler, Inhale 2 puffs into the lungs every 6 (six) hours as needed for wheezing or shortness of breath., Disp: 1 Inhaler, Rfl: 5 .  ALPRAZolam (XANAX) 0.25 MG tablet, Take 1 tablet (0.25 mg  total) by mouth 2 (two) times daily as needed for anxiety., Disp: 60 tablet, Rfl: 0 .  amLODipine (NORVASC) 10 MG tablet, Take 1 tablet (10 mg total) by mouth daily., Disp: 90 tablet, Rfl: 4 .  levothyroxine (SYNTHROID, LEVOTHROID) 150 MCG tablet, TAKE 1 TABLET BY MOUTH EVERY DAY BEFORE BREAKFAST, Disp: 90 tablet, Rfl: 0 .  metoprolol succinate (TOPROL-XL) 50 MG 24 hr tablet, TAKE 1 TABLET BY MOUTH EVERY DAY IMMEDIATELY FOLLOWING A MEAL, Disp: 90 tablet, Rfl: 0 .  olmesartan-hydrochlorothiazide (BENICAR HCT) 40-25 MG tablet, Take 1 tablet by mouth daily., Disp: 90 tablet, Rfl: 3 .  omeprazole (PRILOSEC) 40 MG capsule, TAKE 1 CAPSULE BY MOUTH DAILY, Disp: 90 capsule, Rfl: 0 .  promethazine (PHENERGAN) 25 MG tablet, TAKE 1 TABLET BY MOUTH EVERY 8 HOURS AS NEEDED FOR NAUSEA AND VOMITING, Disp: 20 tablet, Rfl: 0 .  SUMAtriptan (IMITREX) 100 MG tablet, Take 1 tablet (100 mg total) by mouth once as needed. May repeat in 2 hours if headache persists or recurs. No more than 2x in one day., Disp: 10 tablet, Rfl: 12  EXAM:  Vitals:   05/31/18 1302  BP: 100/82  Pulse: 62  Temp: 98.3 F (36.8 C)    Body mass index is 36.1 kg/m.  GENERAL: vitals reviewed and listed above, alert, oriented, appears well hydrated and in no acute distress  HEENT: atraumatic, conjunttiva clear, no obvious abnormalities on inspection of external nose and ears  NECK: no obvious masses on inspection  LUNGS: clear to auscultation bilaterally, no wheezes, rales or rhonchi, good air movement  CV: HRRR, no peripheral edema  MS: moves all extremities without noticeable abnormality  PSYCH: pleasant and cooperative, no obvious depression or anxiety  ASSESSMENT AND PLAN:  Discussed the following assessment and plan:  Seasonal allergies  Acquired hypothyroidism - Plan: TSH  Essential hypertension - Plan: Basic metabolic panel, CBC  Dyslipidemia  -will get some labs today -set up transfer visit with Dr. Jerilee Hoh,  taking over for Dr. Raliegh Ip whom retire -I'll refill chronic meds in the interim if needed, reports doesn't need any refills today -flu shot today per her request -follow up sooner as needed  Patient Instructions  BEFORE YOU LEAVE: -flu shot -labs -follow up: Transfer visit with Dr. Jerilee Hoh in 4 months   We recommend the following healthy lifestyle for LIFE: 1) Small portions. But, make sure to get regular (at least 3 per day), healthy meals and small healthy snacks if needed.  2) Eat a healthy clean diet.   TRY TO EAT: -at least 5-7 servings of low sugar, colorful, and nutrient rich vegetables per day (not corn, potatoes or bananas.) -berries are the best choice if you wish to eat fruit (only eat small amounts if trying to reduce weight)  -lean meets (fish, white meat of chicken or [place]) -vegan proteins for some meals - beans  or tofu, whole grains, nuts and seeds -Replace bad fats with good fats - good fats include: fish, nuts and seeds, canola oil, olive oil -small amounts of low fat or non fat dairy -small amounts of100 % whole grains - check the lables -drink plenty of water  AVOID: -SUGAR, sweets, anything with added sugar, corn syrup or sweeteners - must read labels as even foods advertised as "healthy" often are loaded with sugar -if you must have a sweetener, small amounts of stevia may be best -sweetened beverages and artificially sweetened beverages -simple starches (rice, bread, potatoes, pasta, chips, etc - small amounts of 100% whole grains are ok) -red meat, pork, butter -fried foods, fast food, processed food, excessive dairy, eggs and coconut.  3)Get at least 150 minutes of sweaty aerobic exercise per week.  4)Reduce stress - consider counseling, meditation and relaxation to balance other aspects of your life.     Lucretia Kern, DO

## 2018-06-09 MED ORDER — LEVOTHYROXINE SODIUM 137 MCG PO TABS: 137.0000 ug | ORAL_TABLET | Freq: Every day | ORAL | 3 refills | Status: DC

## 2018-06-09 NOTE — Addendum Note (Signed)
Addended by: Agnes Lawrence on: 06/09/2018 08:40 AM   Modules accepted: Orders

## 2018-06-09 NOTE — Addendum Note (Signed)
Addended by: Agnes Lawrence on: 06/09/2018 09:48 AM   Modules accepted: Orders

## 2018-06-14 ENCOUNTER — Other Ambulatory Visit: Payer: Self-pay | Admitting: Family Medicine

## 2018-06-16 NOTE — Telephone Encounter (Signed)
Patient states that she saw Dr Maudie Mercury for her "6 month check up and should not have to be seen again until her appointment with her new doctor".    Appointment with Dr Jerilee Hoh  Is 10/04/17  Okay to refill until 10/04/17?

## 2018-06-16 NOTE — Telephone Encounter (Signed)
Left message to return phone call.

## 2018-06-17 NOTE — Telephone Encounter (Signed)
Does she use this for her migraines? If so - ok to refill once. If new nausea or vomiting or GI issue advise appt. Thanks.

## 2018-06-23 ENCOUNTER — Other Ambulatory Visit: Payer: Self-pay

## 2018-07-04 ENCOUNTER — Encounter: Payer: Self-pay | Admitting: Family Medicine

## 2018-07-04 ENCOUNTER — Ambulatory Visit: Payer: No Typology Code available for payment source | Admitting: Family Medicine

## 2018-07-04 ENCOUNTER — Encounter: Payer: Self-pay | Admitting: *Deleted

## 2018-07-04 ENCOUNTER — Ambulatory Visit (INDEPENDENT_AMBULATORY_CARE_PROVIDER_SITE_OTHER): Payer: No Typology Code available for payment source

## 2018-07-04 VITALS — BP 100/70 | HR 63 | Temp 98.5°F | Ht 65.5 in | Wt 218.9 lb

## 2018-07-04 DIAGNOSIS — R059 Cough, unspecified: Secondary | ICD-10-CM

## 2018-07-04 DIAGNOSIS — R062 Wheezing: Secondary | ICD-10-CM

## 2018-07-04 DIAGNOSIS — R05 Cough: Secondary | ICD-10-CM

## 2018-07-04 DIAGNOSIS — J069 Acute upper respiratory infection, unspecified: Secondary | ICD-10-CM

## 2018-07-04 MED ORDER — ALBUTEROL SULFATE HFA 108 (90 BASE) MCG/ACT IN AERS: 2.0000 | INHALATION_SPRAY | Freq: Four times a day (QID) | RESPIRATORY_TRACT | 0 refills | Status: DC | PRN

## 2018-07-04 NOTE — Progress Notes (Signed)
HPI:  Using dictation device. Unfortunately this device frequently misinterprets words/phrases.   Acute visit for respiratory illness: -started:5 days ago -symptoms:nasal congestion, sore throat, cough, low grade temp a few times - 99-100.2 -denies:fever > 100.4, SOB, NVD, body aches, tooth pain -has tried: tessalon in the past and did not tolerate -sick contacts/travel/risks: no reported flu, strep or tick exposure -Hx of: denies hx asthma, though told may have it a few times in the past per her report ROS: See pertinent positives and negatives per HPI.  Past Medical History:  Diagnosis Date  . Anxiety   . C1q nephropathy   . Cancer [facility])    history of thyroid cancer  . Chronic constipation   . CKD (chronic kidney disease), stage II nephrologist-  dr Gretchen Short [facility])   secondary to C1q nephropathy/  hypertension  . Family history of adverse reaction to anesthesia    father hard to put down for anesthesia  . GERD (gastroesophageal reflux disease)   . H/O radioactive iodine thyroid ablation    1992  right throid  . History of exercise intolerance    [id number]  ETT--  normal w/ no ischemia per cardiologist (dr Aundra Dubin) epic note  . History of pulmonary embolus (PE)    07/ 2008-- treated w/ coumadin  . History of syncope    recurrent --  workup done per epic -- dx orthostatic hypotension  . Hypertension   . IBS (irritable bowel syndrome)   . Pelvic pain in female   . 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 Proteinuria   . Seasonal and perennial allergic rhinitis   . Wears contact lenses     Past Surgical History:  Procedure Laterality Date  . [place] STUDY N/A 11/16/2016   Procedure: [place] STUDY;  Surgeon: Doran Stabler, MD;  Location: [facility];  Service: Gastroenterology;  Laterality: N/A;  . ABDOMINAL HYSTERECTOMY  1998  . CYSTOSCOPY N/A 04/28/2016   Procedure: CYSTOSCOPY;  Surgeon: Arvella Nigh, MD;   Location: [facility];  Service: Gynecology;  Laterality: N/A;  . ESOPHAGEAL MANOMETRY N/A 11/16/2016   Procedure: ESOPHAGEAL MANOMETRY (EM);  Surgeon: Doran Stabler, MD;  Location: [facility];  Service: Gastroenterology;  Laterality: N/A;  . ESOPHAGOGASTRODUODENOSCOPY    . LAPAROSCOPIC CHOLECYSTECTOMY  1997  . LAPAROSCOPY N/A 03/26/2016   Procedure: LAPAROSCOPY DIAGNOSTIC, LYSIS OF ADHESIONS;  Surgeon: Arvella Nigh, MD;  Location: [place];  Service: Gynecology;  Laterality: N/A;  . LAPAROTOMY N/A 04/28/2016   Procedure: EXPLORATORY LAPAROTOMY;  Surgeon: Arvella Nigh, MD;  Location: [facility];  Service: Gynecology;  Laterality: N/A;  . RENAL BIOPSY    . SALPINGOOPHORECTOMY Bilateral 04/28/2016   Procedure: Bilateral OOPHORECTOMY;  Surgeon: Arvella Nigh, MD;  Location: [facility];  Service: Gynecology;  Laterality: Bilateral;  . THYROIDECTOMY Left 1991  . TRANSTHORACIC ECHOCARDIOGRAM  04/27/2012   normal LV, ef 60-65%/  trivial MR  . TUBAL LIGATION  1992    Family History  Problem Relation Age of Onset  . Hypertension Father   . Coronary artery disease Father   . Diabetes Father   . Stroke Father   . Prostate cancer Father   . Diabetes Mother        Pre-diabetic  . Hypertension Mother   . Breast cancer Maternal Aunt   . Colon cancer Neg Hx   . Colon polyps Neg Hx   . Kidney disease Neg Hx   .  Esophageal cancer Neg Hx   . Gallbladder disease Neg Hx     Social History   Socioeconomic History  . Marital status: Married    Spouse name: Not on file  . Number of children: 2  . Years of education: Not on file  . Highest education level: Not on file  Occupational History  . Occupation: NURSING ASSISTANT    Employer: [employer]  . Financial resource strain: Not on file  . Food insecurity:    Worry: Not on file    Inability: Not on file  . Transportation needs:    Medical: Not on file    Non-medical: Not on file  Tobacco Use  . Smoking status:  Never Smoker  . Smokeless tobacco: Never Used  Substance and Sexual Activity  . Alcohol use: No    Alcohol/week: 0.0 standard drinks  . Drug use: No  . Sexual activity: Yes    Birth control/protection: Surgical  Lifestyle  . Physical activity:    Days per week: Not on file    Minutes per session: Not on file  . Stress: Not on file  Relationships  . Social connections:    Talks on phone: Not on file    Gets together: Not on file    Attends religious service: Not on file    Active member of club or organization: Not on file    Attends meetings of clubs or organizations: Not on file    Relationship status: Not on file  Other Topics Concern  . Not on file  Social History Narrative  . Not on file     Current Outpatient Medications:  .  ALPRAZolam (XANAX) 0.25 MG tablet, Take 1 tablet (0.25 mg total) by mouth 2 (two) times daily as needed for anxiety., Disp: 60 tablet, Rfl: 0 .  amLODipine (NORVASC) 10 MG tablet, Take 1 tablet (10 mg total) by mouth daily., Disp: 90 tablet, Rfl: 4 .  levothyroxine (SYNTHROID, LEVOTHROID) 137 MCG tablet, Take 1 tablet (137 mcg total) by mouth daily before breakfast., Disp: 30 tablet, Rfl: 3 .  metoprolol succinate (TOPROL-XL) 50 MG 24 hr tablet, TAKE 1 TABLET BY MOUTH EVERY DAY IMMEDIATELY FOLLOWING A MEAL, Disp: 90 tablet, Rfl: 0 .  olmesartan-hydrochlorothiazide (BENICAR HCT) 40-25 MG tablet, Take 1 tablet by mouth daily., Disp: 90 tablet, Rfl: 3 .  omeprazole (PRILOSEC) 40 MG capsule, TAKE 1 CAPSULE BY MOUTH DAILY, Disp: 90 capsule, Rfl: 0 .  promethazine (PHENERGAN) 25 MG tablet, 1 tab every 8 hours as needed for nausea due to headache, Disp: 20 tablet, Rfl: 0 .  SUMAtriptan (IMITREX) 100 MG tablet, Take 1 tablet (100 mg total) by mouth once as needed. May repeat in 2 hours if headache persists or recurs. No more than 2x in one day., Disp: 10 tablet, Rfl: 12 .  albuterol (PROVENTIL HFA;VENTOLIN HFA) 108 (90 Base) MCG/ACT inhaler, Inhale 2 puffs into  the lungs every 6 (six) hours as needed., Disp: 1 Inhaler, Rfl: 0  EXAM:  Vitals:   07/04/18 0930  BP: 100/70  Pulse: 63  Temp: 98.5 F (36.9 C)  SpO2: 96%    Body mass index is 35.87 kg/m.  GENERAL: vitals reviewed and listed above, alert, oriented, appears well hydrated and in no acute distress  HEENT: atraumatic, conjunttiva clear, no obvious abnormalities on inspection of external nose and ears, normal appearance of ear canals and TMs, clear nasal congestion, mild post oropharyngeal erythema with PND, no tonsillar edema or exudate,  no sinus TTP  NECK: no obvious masses on inspection  LUNGS: clear to auscultation bilaterally, no rales or rhonchi, good air movement, occ scattered insp wheeze  CV: HRRR, no peripheral edema  MS: moves all extremities without noticeable abnormality  PSYCH: pleasant and cooperative, no obvious depression or anxiety  ASSESSMENT AND PLAN:  Discussed the following assessment and plan:  Cough - Plan: DG Chest 2 View  Upper respiratory infection, acute  Wheeze - Plan: DG Chest 2 View  -given HPI and exam findings today, a serious infection or illness is unlikely. We discussed potential etiologies, with VURI, possible bronchitis being most likely. Alb as needed, xray to exclude LRI, supportive care. We discussed treatment side effects, likely course, antibiotic misuse, transmission, and signs of developing a serious illness. -of course, we advised to return or notify a doctor immediately if symptoms worsen or persist or new concerns arise.    Patient Instructions  BEFORE YOU LEAVE: -Xray -follow up: as scheduled and as needed  Get the xray  Albuterol as needed per instructions  See below as well  I hope you are feeling better soon! Seek care promptly if your symptoms worsen, new concerns arise or you are not improving with treatment.   INSTRUCTIONS FOR UPPER RESPIRATORY INFECTION:  -plenty of rest and fluids  -nasal saline wash  2-3 times daily (use prepackaged nasal saline or bottled/distilled water if making your own)   -can use AFRIN nasal spray for drainage and nasal congestion - but do NOT use longer then 3-4 days  -in the winter time, using a humidifier at night is helpful (please follow cleaning instructions)  -if you are taking a cough medication - use only as directed, may also try a teaspoon of honey to coat the throat and throat lozenges.   -for sore throat, salt water gargles can help  -follow up if you have fevers, facial pain, tooth pain, difficulty breathing or are worsening or symptoms persist longer then expected  Upper Respiratory Infection, Adult An upper respiratory infection (URI) is also known as the common cold. It is often caused by a type of germ (virus). Colds are easily spread (contagious). You can pass it to others by kissing, coughing, sneezing, or drinking out of the same glass. Usually, you get better in 1 to 3  weeks.  However, the cough can last for even longer. HOME CARE   Only take medicine as told by your doctor. Follow instructions provided above.  Drink enough water and fluids to keep your pee (urine) clear or pale yellow.  Get plenty of rest.  Return to work when your temperature is < 100 for 24 hours or as told by your doctor. You may use a face mask and wash your hands to stop your cold from spreading. GET HELP RIGHT AWAY IF:   After the first few days, you feel you are getting worse.  You have questions about your medicine.  You have chills, shortness of breath, or red spit (mucus).  You have pain in the face for more then 1-2 days, especially when you bend forward.  You have a fever, puffy (swollen) neck, pain when you swallow, or white spots in the back of your throat.  You have a bad headache, ear pain, sinus pain, or chest pain.  You have a high-pitched whistling sound when you breathe in and out (wheezing).  You cough up blood.  You have sore muscles or  a stiff neck. MAKE SURE YOU:   Understand  these instructions.  Will watch your condition.  Will get help right away if you are not doing well or get worse. Document Released: 01/20/2008 Document Revised: 10/26/2011 Document Reviewed: 11/08/2013 [facility] Patient Information 2015 [place], [place]. This information is not intended to replace advice given to you by your health care provider. Make sure you discuss any questions you have with your health care provider.    Lucretia Kern, DO

## 2018-07-04 NOTE — Patient Instructions (Signed)
BEFORE YOU LEAVE: -Xray -follow up: as scheduled and as needed  Get the xray  Albuterol as needed per instructions  See below as well  I hope you are feeling better soon! Seek care promptly if your symptoms worsen, new concerns arise or you are not improving with treatment.   INSTRUCTIONS FOR UPPER RESPIRATORY INFECTION:  -plenty of rest and fluids  -nasal saline wash 2-3 times daily (use prepackaged nasal saline or bottled/distilled water if making your own)   -can use AFRIN nasal spray for drainage and nasal congestion - but do NOT use longer then 3-4 days  -in the winter time, using a humidifier at night is helpful (please follow cleaning instructions)  -if you are taking a cough medication - use only as directed, may also try a teaspoon of honey to coat the throat and throat lozenges.   -for sore throat, salt water gargles can help  -follow up if you have fevers, facial pain, tooth pain, difficulty breathing or are worsening or symptoms persist longer then expected  Upper Respiratory Infection, Adult An upper respiratory infection (URI) is also known as the common cold. It is often caused by a type of germ (virus). Colds are easily spread (contagious). You can pass it to others by kissing, coughing, sneezing, or drinking out of the same glass. Usually, you get better in 1 to 3  weeks.  However, the cough can last for even longer. HOME CARE   Only take medicine as told by your doctor. Follow instructions provided above.  Drink enough water and fluids to keep your pee (urine) clear or pale yellow.  Get plenty of rest.  Return to work when your temperature is < 100 for 24 hours or as told by your doctor. You may use a face mask and wash your hands to stop your cold from spreading. GET HELP RIGHT AWAY IF:   After the first few days, you feel you are getting worse.  You have questions about your medicine.  You have chills, shortness of breath, or red spit (mucus).  You  have pain in the face for more then 1-2 days, especially when you bend forward.  You have a fever, puffy (swollen) neck, pain when you swallow, or white spots in the back of your throat.  You have a bad headache, ear pain, sinus pain, or chest pain.  You have a high-pitched whistling sound when you breathe in and out (wheezing).  You cough up blood.  You have sore muscles or a stiff neck. MAKE SURE YOU:   Understand these instructions.  Will watch your condition.  Will get help right away if you are not doing well or get worse. Document Released: 01/20/2008 Document Revised: 10/26/2011 Document Reviewed: 11/08/2013 [facility] Patient Information 2015 [place], [place]. This information is not intended to replace advice given to you by your health care provider. Make sure you discuss any questions you have with your health care provider.

## 2018-07-19 ENCOUNTER — Other Ambulatory Visit (INDEPENDENT_AMBULATORY_CARE_PROVIDER_SITE_OTHER): Payer: No Typology Code available for payment source

## 2018-07-19 DIAGNOSIS — E039 Hypothyroidism, unspecified: Secondary | ICD-10-CM | POA: Diagnosis not present

## 2018-07-19 LAB — TSH: TSH: 0.02 u[IU]/mL — AB (ref 0.35–4.50)

## 2018-07-20 LAB — CALCIUM, IONIZED: Calcium, Ion: 5.69 mg/dL — ABNORMAL HIGH (ref 4.8–5.6)

## 2018-07-22 ENCOUNTER — Other Ambulatory Visit: Payer: No Typology Code available for payment source

## 2018-07-22 MED ORDER — LEVOTHYROXINE SODIUM 125 MCG PO TABS: 125.0000 ug | ORAL_TABLET | Freq: Every day | ORAL | 0 refills | Status: DC

## 2018-07-22 NOTE — Addendum Note (Signed)
Addended by: Agnes Lawrence on: 07/22/2018 01:14 PM   Modules accepted: Orders

## 2018-07-23 ENCOUNTER — Other Ambulatory Visit: Payer: Self-pay | Admitting: Internal Medicine

## 2018-07-26 ENCOUNTER — Other Ambulatory Visit: Payer: Self-pay | Admitting: *Deleted

## 2018-07-26 MED ORDER — OMEPRAZOLE 40 MG PO CPDR: 40.0000 mg | DELAYED_RELEASE_CAPSULE | Freq: Every day | ORAL | 0 refills | Status: DC

## 2018-07-27 ENCOUNTER — Other Ambulatory Visit: Payer: Self-pay | Admitting: *Deleted

## 2018-07-27 MED ORDER — METOPROLOL SUCCINATE ER 50 MG PO TB24: ORAL_TABLET | ORAL | 0 refills | Status: DC

## 2018-07-27 MED ORDER — OMEPRAZOLE 40 MG PO CPDR: 40.0000 mg | DELAYED_RELEASE_CAPSULE | Freq: Every day | ORAL | 0 refills | Status: DC

## 2018-07-28 ENCOUNTER — Other Ambulatory Visit: Payer: Self-pay | Admitting: Family Medicine

## 2018-07-29 ENCOUNTER — Telehealth: Payer: Self-pay | Admitting: *Deleted

## 2018-07-29 NOTE — Telephone Encounter (Signed)
Prior auth for Omeprazole 40mg  requested from the pharmacy.  Note in [email or web address] (key [id number]) stated this cannot be submitted electronically and I called [phone] and spoke with [place].  She stated no PA is needed as a paid claim went through for #90 on 12/10.

## 2018-08-02 ENCOUNTER — Ambulatory Visit: Payer: No Typology Code available for payment source | Admitting: Family Medicine

## 2018-08-02 ENCOUNTER — Encounter: Payer: Self-pay | Admitting: Family Medicine

## 2018-08-02 ENCOUNTER — Ambulatory Visit: Payer: Self-pay | Admitting: *Deleted

## 2018-08-02 VITALS — BP 100/72 | HR 89 | Temp 99.2°F | Ht 65.5 in | Wt 222.4 lb

## 2018-08-02 DIAGNOSIS — R059 Cough, unspecified: Secondary | ICD-10-CM

## 2018-08-02 DIAGNOSIS — R05 Cough: Secondary | ICD-10-CM

## 2018-08-02 MED ORDER — METHYLPREDNISOLONE ACETATE 80 MG/ML IJ SUSP
80.0000 mg | Freq: Once | INTRAMUSCULAR | Status: AC
  Administered 2018-08-02: 80 mg via INTRAMUSCULAR

## 2018-08-02 MED ORDER — AZITHROMYCIN 250 MG PO TABS: ORAL_TABLET | ORAL | 0 refills | Status: DC

## 2018-08-02 MED ORDER — IPRATROPIUM BROMIDE 0.06 % NA SOLN: 2.0000 | Freq: Four times a day (QID) | NASAL | 0 refills | Status: DC

## 2018-08-02 MED ORDER — BENZONATATE 200 MG PO CAPS: 200.0000 mg | ORAL_CAPSULE | Freq: Two times a day (BID) | ORAL | 0 refills | Status: DC | PRN

## 2018-08-02 NOTE — Telephone Encounter (Signed)
Patient was seen 07/04/18  And treated for bronchitis. Patient reports she seemed to get better- but now she is getting worse with increase in cough and wheezing with deep breath in and when laying at night.  Reason for Disposition . Wheezing is present  Answer Assessment - Initial Assessment Questions 1. ONSET: "When did the cough begin?"      Patient was seen at office 3 weeks ago- and treated for bronchitis  2. SEVERITY: "How bad is the cough today?"      Cough never went away- it has gotten worse this week 3. RESPIRATORY DISTRESS: "Describe your breathing."      Hard to catch breath when coughing, wheezing is heard when breathing in and at night when laying down 4. FEVER: "Do you have a fever?" If so, ask: "What is your temperature, how was it measured, and when did it start?"     no 5. SPUTUM: "Describe the color of your sputum" (clear, white, yellow, green)     clear 6. HEMOPTYSIS: "Are you coughing up any blood?" If so ask: "How much?" (flecks, streaks, tablespoons, etc.)     no 7. CARDIAC HISTORY: "Do you have any history of heart disease?" (e.g., heart attack, congestive heart failure)      no 8. LUNG HISTORY: "Do you have any history of lung disease?"  (e.g., pulmonary embolus, asthma, emphysema)     No 9. PE RISK FACTORS: "Do you have a history of blood clots?" (or: recent major surgery, recent prolonged travel, bedridden)     R lung 2008 10. OTHER SYMPTOMS: "Do you have any other symptoms?" (e.g., runny nose, wheezing, chest pain)       Runny nose with cough 11. PREGNANCY: "Is there any chance you are pregnant?" "When was your last menstrual period?"       n/a 12. TRAVEL: "Have you traveled out of the country in the last month?" (e.g., travel history, exposures)       n/a  Protocols used: [place]

## 2018-08-02 NOTE — Patient Instructions (Signed)
Start the atrovent and azithromycin.  We will give you an injection of an anti-inflammatory called depo-medrol today.   Please stay well hydrated.  You can take tylenol and/or motrin as needed for low grade fever and pain.  Please let me know if your symptoms worsen or fail to improve.  Take care, Dr Jerline Pain

## 2018-08-02 NOTE — Telephone Encounter (Signed)
See note

## 2018-08-02 NOTE — Progress Notes (Signed)
   Subjective:  Rhonda [name] is a 47 y.o. female who presents today for same-day appointment with a chief complaint of cough.   HPI:  Cough, Acute problem Started 3 weeks ago. Got worse a few a days ago.  She has tried using her albuterol inhaler with helps for a little bit however wears off after a few hours.  Associated with malaise, low-grade fevers, chills, and rhinorrhea.  No known sick contacts.  Symptoms are worse at night.  No other obvious alleviating or aggravating factors.  ROS: Per HPI  PMH: She reports that she has never smoked. She has never used smokeless tobacco. She reports that she does not drink alcohol or use drugs.  Objective:  Physical Exam: BP 100/72 (BP Location: Left Arm, Patient Position: Sitting, Cuff Size: Large)   Pulse 89   Temp 99.2 F (37.3 C) (Oral)   Ht 5' 5.5" (1.664 m)   Wt 222 lb 6.1 oz (100.9 kg)   SpO2 97%   BMI 36.44 kg/m   Gen: NAD, resting comfortably HEENT: TMs with clear effusion.  OP clear.  Nasal mucosa erythematous and boggy bilaterally. CV: RRR with no murmurs appreciated Pulm: NWOB, occasional scattered rhonchi, otherwise clear to auscultation bilaterally.  Assessment/Plan:  Cough/bronchitis Given the symptoms have been persistent for the last 3 weeks and are worsening recently, will start azithromycin today.  Also start Atrovent nasal spray for her rhinorrhea/sinus congestion.  Will give 80 mg of IM Depo-Medrol as well.  Recommended Tylenol/or Motrin as needed.  Encouraged good oral hydration.  Discussed reasons to return to care.  Follow-up as needed.  Algis Greenhouse. Jerline Pain, MD 08/02/2018 4:01 PM

## 2018-08-05 ENCOUNTER — Other Ambulatory Visit (INDEPENDENT_AMBULATORY_CARE_PROVIDER_SITE_OTHER): Payer: No Typology Code available for payment source

## 2018-08-05 ENCOUNTER — Other Ambulatory Visit: Payer: Self-pay | Admitting: Family Medicine

## 2018-08-05 DIAGNOSIS — E039 Hypothyroidism, unspecified: Secondary | ICD-10-CM

## 2018-08-05 LAB — T4, FREE: Free T4: 0.98 ng/dL (ref 0.60–1.60)

## 2018-08-05 LAB — TSH: TSH: 0.08 u[IU]/mL — ABNORMAL LOW (ref 0.35–4.50)

## 2018-08-05 MED ORDER — PROMETHAZINE HCL 25 MG PO TABS: ORAL_TABLET | ORAL | 0 refills | Status: DC

## 2018-08-05 NOTE — Telephone Encounter (Signed)
Pt is not scheduled for TOC appt until feb 2020 Dr Maudie Mercury saw the patient the last two times in office ( acute visits)  Please advise if able to refill, thanks.

## 2018-08-09 LAB — PTH, INTACT AND CALCIUM
Calcium: 10 mg/dL (ref 8.6–10.2)
PTH: 88 pg/mL — ABNORMAL HIGH (ref 14–64)

## 2018-08-09 LAB — T3: T3, Total: 106 ng/dL (ref 76–181)

## 2018-08-09 LAB — CALCIUM, IONIZED: Calcium, Ion: 5.4 mg/dL (ref 4.8–5.6)

## 2018-08-15 ENCOUNTER — Telehealth: Payer: Self-pay | Admitting: Internal Medicine

## 2018-08-15 NOTE — Telephone Encounter (Signed)
Copied from [place] [id number]. Topic: Quick Communication - Lab Results (Clinic Use ONLY) >> Aug 15, 2018  2:52 PM Agnes Lawrence, CMA wrote: Called patient to inform them of lab results. When patient returns call, triage nurse may disclose results. >> Aug 15, 2018  3:56 PM Wynetta Emery, [place] C wrote: Pt is returning call for lab results.  CB: [phone]

## 2018-08-16 NOTE — Telephone Encounter (Signed)
Attempted to call patient regarding results

## 2018-08-22 ENCOUNTER — Telehealth: Payer: Self-pay | Admitting: *Deleted

## 2018-08-22 NOTE — Telephone Encounter (Signed)
Request from [place] states to call [place] for a prior auth.  I called [facility] at [phone] and spoke with Tasha.  She stated no PA is needed as the pharmacy requested a refill too soon as the Rx was last filled on 12/17 for a 30-day supply.  Aniceto Boss also stated if the pt needed the actual Rx early due to going out of town,etc the pharmacy can call the number given on the rejection info line.  I called [facility], informed Caryl Pina of this and she stated they will inform the pt.

## 2018-08-30 ENCOUNTER — Other Ambulatory Visit: Payer: Self-pay | Admitting: Family Medicine

## 2018-09-02 ENCOUNTER — Other Ambulatory Visit (INDEPENDENT_AMBULATORY_CARE_PROVIDER_SITE_OTHER): Payer: No Typology Code available for payment source

## 2018-09-02 ENCOUNTER — Other Ambulatory Visit: Payer: No Typology Code available for payment source

## 2018-09-02 DIAGNOSIS — R7989 Other specified abnormal findings of blood chemistry: Secondary | ICD-10-CM

## 2018-09-02 DIAGNOSIS — E039 Hypothyroidism, unspecified: Secondary | ICD-10-CM

## 2018-09-02 DIAGNOSIS — E034 Atrophy of thyroid (acquired): Secondary | ICD-10-CM

## 2018-09-02 LAB — T4, FREE: FREE T4: 1.22 ng/dL (ref 0.60–1.60)

## 2018-09-02 LAB — T3, FREE: T3, Free: 3.2 pg/mL (ref 2.3–4.2)

## 2018-09-02 LAB — TSH: TSH: 0.04 u[IU]/mL — ABNORMAL LOW (ref 0.35–4.50)

## 2018-09-05 MED ORDER — LEVOTHYROXINE SODIUM 112 MCG PO TABS: 112.0000 ug | ORAL_TABLET | Freq: Every day | ORAL | 0 refills | Status: DC

## 2018-09-05 NOTE — Addendum Note (Signed)
Addended by: Agnes Lawrence on: 09/05/2018 08:36 AM   Modules accepted: Orders

## 2018-09-09 ENCOUNTER — Encounter: Payer: Self-pay | Admitting: Family Medicine

## 2018-09-27 ENCOUNTER — Ambulatory Visit: Payer: No Typology Code available for payment source | Admitting: Endocrinology

## 2018-09-27 ENCOUNTER — Encounter: Payer: Self-pay | Admitting: Endocrinology

## 2018-09-27 VITALS — BP 116/80 | HR 70 | Ht 66.0 in | Wt 225.0 lb

## 2018-09-27 DIAGNOSIS — R5383 Other fatigue: Secondary | ICD-10-CM

## 2018-09-27 DIAGNOSIS — E890 Postprocedural hypothyroidism: Secondary | ICD-10-CM

## 2018-09-27 LAB — BASIC METABOLIC PANEL
BUN: 26 mg/dL — AB (ref 6–23)
CO2: 28 mEq/L (ref 19–32)
Calcium: 10.3 mg/dL (ref 8.4–10.5)
Chloride: 104 mEq/L (ref 96–112)
Creatinine, Ser: 1.38 mg/dL — ABNORMAL HIGH (ref 0.40–1.20)
GFR: 49.04 mL/min — ABNORMAL LOW (ref >60.00)
Glucose, Bld: 114 mg/dL — ABNORMAL HIGH (ref 70–99)
Potassium: 4.4 mEq/L (ref 3.5–5.1)
Sodium: 140 mEq/L (ref 135–145)

## 2018-09-27 LAB — CBC
HCT: 38.9 % (ref 36.0–46.0)
Hemoglobin: 12.7 g/dL (ref 12.0–15.0)
MCHC: 32.6 g/dL (ref 30.0–36.0)
MCV: 85.5 fl (ref 78.0–100.0)
Platelets: 293 10*3/uL (ref 150.0–400.0)
RBC: 4.55 Mil/uL (ref 3.87–5.11)
RDW: 14.5 % (ref 11.5–15.5)
WBC: 7.4 10*3/uL (ref 4.0–10.5)

## 2018-09-27 LAB — T4, FREE: Free T4: 0.97 ng/dL (ref 0.60–1.60)

## 2018-09-27 LAB — TSH: TSH: 0.14 u[IU]/mL — ABNORMAL LOW (ref 0.35–4.50)

## 2018-09-27 LAB — T3, FREE: T3, Free: 3.7 pg/mL (ref 2.3–4.2)

## 2018-09-27 NOTE — Progress Notes (Signed)
Patient ID: Rhonda [name], female   DOB: 07/30/1971, 48 y.o.   MRN: [id number]           Referring Provider: Colin Benton  Reason for Appointment:  Hypothyroidism, new visit    History of Present Illness:   Hypothyroidism was first diagnosed in 1991  Hypothyroidism apparently was secondary to surgery on her goiter and no details are available She was told that after the surgery there was a focus of cancer found in her thyroid but again no details available She subsequently had radioactive iodine treatment         The patient has been treated with variable doses of levothyroxine For quite some time she has been taking 150 mcg levothyroxine and apparently was feeling fairly good with this    When she was seeing her new primary care doctor in 05/2018 she was feeling fairly good at that time but her TSH was 0.01 No prior levels of TSH are available on her record since 2016   The patient takes the thyroid supplement before breakfast, she also takes her other medications and Prilosec at the same time         Patient's weight history is as follows:  Wt Readings from Last 3 Encounters:  09/27/18 225 lb (102.1 kg)  08/02/18 222 lb 6.1 oz (100.9 kg)  07/04/18 218 lb 14.4 oz (99.3 kg)    Thyroid function results have been as follows:  Lab Results  Component Value Date   TSH 0.04 (L) 09/02/2018   TSH 0.08 (L) 08/05/2018   TSH 0.02 (L) 07/19/2018   TSH 0.01 (L) 05/31/2018   FREET4 1.22 09/02/2018   FREET4 0.98 08/05/2018   T3FREE 3.2 09/02/2018   HYPERCALCEMIA:  She had a high calcium in October 2019 which is new However review of her medications indicates that she was previously not on HCTZ and has been on 25 mg HCTZ since middle of 2019  PTH level done from [place] lab was high She does not take any large doses of calcium or vitamin D supplements No history of kidney stones  Lab Results  Component Value Date   CALCIUM 10.0 08/05/2018   CALCIUM 10.6 (H) 05/31/2018     CALCIUM 9.7 03/27/2017   CALCIUM 9.9 07/11/2016   CALCIUM 9.9 05/27/2016   CALCIUM 9.6 04/23/2016   CALCIUM 9.5 03/24/2016   Lab Results  Component Value Date   PTH 88 (H) 08/05/2018   CALCIUM 10.3 09/27/2018   CAION 5.40 08/05/2018     Past Medical History:  Diagnosis Date  . Anxiety   . C1q nephropathy   . Cancer [facility])    history of thyroid cancer  . Chronic constipation   . CKD (chronic kidney disease), stage II nephrologist-  dr Gretchen Short [facility])   secondary to C1q nephropathy/  hypertension  . Family history of adverse reaction to anesthesia    father hard to put down for anesthesia  . GERD (gastroesophageal reflux disease)   . H/O radioactive iodine thyroid ablation    1992  right throid  . History of exercise intolerance    01-052011  ETT--  normal w/ no ischemia per cardiologist (dr Aundra Dubin) epic note  . History of pulmonary embolus (PE)    07/ 2008-- treated w/ coumadin  . History of syncope    recurrent --  workup done per epic -- dx orthostatic hypotension  . Hypertension   . IBS (irritable bowel syndrome)   . Pelvic pain in female   .  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 Proteinuria   . Seasonal and perennial allergic rhinitis   . Wears contact lenses     Past Surgical History:  Procedure Laterality Date  . [place] STUDY N/A 11/16/2016   Procedure: [place] STUDY;  Surgeon: Doran Stabler, MD;  Location: [facility];  Service: Gastroenterology;  Laterality: N/A;  . ABDOMINAL HYSTERECTOMY  1998  . CYSTOSCOPY N/A 04/28/2016   Procedure: CYSTOSCOPY;  Surgeon: Arvella Nigh, MD;  Location: [place] [facility];  Service: Gynecology;  Laterality: N/A;  . ESOPHAGEAL MANOMETRY N/A 11/16/2016   Procedure: ESOPHAGEAL MANOMETRY (EM);  Surgeon: Doran Stabler, MD;  [facility];  Service: Gastroenterology;  Laterality: N/A;  . ESOPHAGOGASTRODUODENOSCOPY    . LAPAROSCOPIC CHOLECYSTECTOMY  1997  . LAPAROSCOPY  N/A 03/26/2016   Procedure: LAPAROSCOPY DIAGNOSTIC, LYSIS OF ADHESIONS;  Surgeon: Arvella Nigh, MD;  Location: [place];  Service: Gynecology;  Laterality: N/A;  . LAPAROTOMY N/A 04/28/2016   Procedure: EXPLORATORY LAPAROTOMY;  Surgeon: Arvella Nigh, MD;  Location: [facility];  Service: Gynecology;  Laterality: N/A;  . RENAL BIOPSY    . SALPINGOOPHORECTOMY Bilateral 04/28/2016   Procedure: Bilateral OOPHORECTOMY;  Surgeon: Arvella Nigh, MD;  Location: [facility];  Service: Gynecology;  Laterality: Bilateral;  . THYROIDECTOMY Left 1991  . TRANSTHORACIC ECHOCARDIOGRAM  04/27/2012   normal LV, ef 60-65%/  trivial MR  . TUBAL LIGATION  1992    Family History  Problem Relation Age of Onset  . Hypertension Father   . Coronary artery disease Father   . Diabetes Father   . Stroke Father   . Prostate cancer Father   . Diabetes Mother        Pre-diabetic  . Hypertension Mother   . Breast cancer Maternal Aunt   . Colon cancer Neg Hx   . Colon polyps Neg Hx   . Kidney disease Neg Hx   . Esophageal cancer Neg Hx   . Gallbladder disease Neg Hx     Social History:  reports that she has never smoked. She has never used smokeless tobacco. She reports that she does not drink alcohol or use drugs.  Allergies:  Allergies  Allergen Reactions  . Aspirin Hives  . Flagyl [Metronidazole] Nausea And Vomiting    With oral flagyl.   Has tolerated vaginal flagyl.    . Food Other (See Comments)    Lima beans--  Positive allergy test   . Tramadol Other (See Comments)    Headache   . Dilaudid [Hydromorphone] Itching    Allergies as of 09/27/2018      Reactions   Aspirin Hives   Flagyl [metronidazole] Nausea And Vomiting   With oral flagyl.   Has tolerated vaginal flagyl.     Food Other (See Comments)   Lima beans--  Positive allergy test    Tramadol Other (See Comments)   Headache    Dilaudid [hydromorphone] Itching      Medication List       Accurate as of September 27, 2018  2:51 PM.  Always use your most recent med list.        albuterol 108 (90 Base) MCG/ACT inhaler Commonly known as:  PROVENTIL HFA;VENTOLIN HFA INHALE 2 PUFFS INTO THE LUNGS EVERY 6 HOURS AS NEEDED   ALPRAZolam 0.25 MG tablet Commonly known as:  XANAX Take 1 tablet (0.25 mg total) by mouth 2 (two) times daily as needed for anxiety.   amLODipine  10 MG tablet Commonly known as:  NORVASC Take 1 tablet (10 mg total) by mouth daily.   ipratropium 0.06 % nasal spray Commonly known as:  ATROVENT Place 2 sprays into both nostrils 4 (four) times daily.   levothyroxine 112 MCG tablet Commonly known as:  SYNTHROID, LEVOTHROID Take 1 tablet (112 mcg total) by mouth daily.   metoprolol succinate 50 MG 24 hr tablet Commonly known as:  TOPROL-XL TAKE 1 TABLET BY MOUTH EVERY DAY IMMEDIATELY FOLLOWING A MEAL   olmesartan-hydrochlorothiazide 40-25 MG tablet Commonly known as:  BENICAR HCT Take 1 tablet by mouth daily.   omeprazole 40 MG capsule Commonly known as:  PRILOSEC Take 1 capsule (40 mg total) by mouth daily.   promethazine 25 MG tablet Commonly known as:  PHENERGAN 1 tab every 8 hours as needed for nausea due to headache          Review of Systems  Constitutional: Positive for weight gain.       Gradually gained weight over the last year  HENT: Negative for headaches.   Respiratory: Negative for shortness of breath.   Cardiovascular: Negative for leg swelling.  Gastrointestinal: Negative for abdominal pain.       She takes Prilosec daily for reflux  Endocrine: Positive for fatigue.  Genitourinary: Negative for frequency.  Musculoskeletal: Negative for joint pain.  Skin: Negative for rash.  Neurological:       She has had some tingling in her fingers  Psychiatric/Behavioral: Negative for insomnia.   Hypertension: She says she does not take amlodipine every day unless blood pressure is high      BP Readings from Last 3 Encounters:  09/27/18 116/80  08/02/18 100/72  07/04/18  100/70           Examination:    BP 116/80 (BP Location: Left Arm, Patient Position: Sitting, Cuff Size: Normal)   Pulse 70   Ht 5\' 6"  (1.676 m)   Wt 225 lb (102.1 kg)   SpO2 98%   BMI 36.32 kg/m   GENERAL:  Large build.  Has generalized obesity  No pallor.    Skin:  no rash or dryness.  Has mild acanthosis of the neck.  EYES:  No prominence of the eyes or swelling of the eyelids  ENT: Oral mucosa and tongue normal.  NECK: No lymphadenopathy  THYROID:  Not palpable.  HEART:  Normal  S1 and S2; no murmur or click.  CHEST:    Lungs: Vescicular breath sounds heard equally.  No crepitations/ wheeze.  ABDOMEN:  No distention.  Liver and spleen not palpable.  No other mass or tenderness.  NEUROLOGICAL: Reflexes are bilaterally normal at biceps.  No tremor present  EXTREMITIES:  Normal peripheral joints.  No ankle edema present   Assessment:  HYPOTHYROIDISM, postsurgical  She appears to be requiring relatively low doses of levothyroxine supplementation in proportion to her weight Although previously had been stable with 150 mcg levothyroxine her TSH was still low even with 125 recently However her doses have been reduced fairly rapidly and may need more time to equilibrate She is complaining of fatigue over the last 2 weeks not clear if this is related to reducing her levothyroxine dosage  She has been relatively compliant with her supplementation with thyroid hormone every morning  HYPERCALCEMIA: Has only 1 instance of high calcium and may possibly be related to starting HCTZ in the last year No history of kidney stones Although PTH level was high and may indicate mild hyperparathyroidism  this was done from [facility] and may not be accurate   PLAN:  Check thyroid levels Check calcium again today and also PTH from [facility]   Follow-up in about 2 months   Elayne Snare 09/27/2018, 2:51 PM   Consultation note copy sent to the PCP  Note: This office note was  prepared with Dragon voice recognition system technology. Any transcriptional errors that result from this process are unintentional.  Addendum: TSH still low but improving and may take longer to equilibrate. We will continue same dose of levothyroxine Calcium 10.3 and PTH pending Creatinine relatively higher and will defer management to PCP  Elayne Snare

## 2018-09-28 LAB — PARATHYROID HORMONE, INTACT (NO CA): PTH: 32 pg/mL (ref 15–65)

## 2018-09-29 NOTE — Progress Notes (Signed)
Hi,  Thanks. Prior PCP retired. I will roue to Dr. Jerilee Hoh whom it looks like she is seeing to establish care with in a few days.

## 2018-10-04 ENCOUNTER — Encounter: Payer: Self-pay | Admitting: Internal Medicine

## 2018-10-04 ENCOUNTER — Ambulatory Visit: Payer: No Typology Code available for payment source | Admitting: Internal Medicine

## 2018-10-04 VITALS — BP 120/82 | HR 90 | Temp 98.3°F | Wt 227.7 lb

## 2018-10-04 DIAGNOSIS — I10 Essential (primary) hypertension: Secondary | ICD-10-CM

## 2018-10-04 DIAGNOSIS — K219 Gastro-esophageal reflux disease without esophagitis: Secondary | ICD-10-CM

## 2018-10-04 DIAGNOSIS — Z86711 Personal history of pulmonary embolism: Secondary | ICD-10-CM

## 2018-10-04 DIAGNOSIS — E890 Postprocedural hypothyroidism: Secondary | ICD-10-CM

## 2018-10-04 DIAGNOSIS — G8929 Other chronic pain: Secondary | ICD-10-CM

## 2018-10-04 DIAGNOSIS — E785 Hyperlipidemia, unspecified: Secondary | ICD-10-CM

## 2018-10-04 DIAGNOSIS — I824Y9 Acute embolism and thrombosis of unspecified deep veins of unspecified proximal lower extremity: Secondary | ICD-10-CM

## 2018-10-04 DIAGNOSIS — M549 Dorsalgia, unspecified: Secondary | ICD-10-CM

## 2018-10-04 DIAGNOSIS — F411 Generalized anxiety disorder: Secondary | ICD-10-CM

## 2018-10-04 NOTE — Patient Instructions (Addendum)
Great meeting you this afternoon.  Instructions: -PHQ-9 today (depression screening). -Schedule follow up in 6 months for you physical. Please come in fasting to that appointment.

## 2018-10-04 NOTE — Progress Notes (Signed)
Established Patient Office Visit     CC/Reason for Visit: Establish care, follow up on chronic medical conditions  HPI: Rhonda [name] is a 48 y.o. female who is coming in today for the above mentioned reasons. Due for annual CPE. Past Medical History is significant for: Hypothyroidism followed by endocrinology, not well controlled for which they are decreasing synthroid dosing due to persistently depressed TSH, HTN: her norvasc was discontinued last week as she was hypotensive, she remains on metoprolol at bedtime and olmesartan/HCTZ in th am, H/o GERD who is very symptomatic when she does not take her PPI. She had DVT/PE in 2008 and completed 6 months of anticoagulation therapy. She also has a h/o CKD Stage III and follows with Dr. Moshe Cipro, was seen recently and no medication changes were made (decision was made to keep her on ARB?HCTZ combination drug). She has no acute complaints today.   Past Medical/Surgical History: Past Medical History:  Diagnosis Date  . Anxiety   . C1q nephropathy   . Cancer [facility])    history of thyroid cancer  . Chronic constipation   . CKD (chronic kidney disease), stage II nephrologist-  dr Gretchen Short [facility])   secondary to C1q nephropathy/  hypertension  . Family history of adverse reaction to anesthesia    father hard to put down for anesthesia  . GERD (gastroesophageal reflux disease)   . H/O radioactive iodine thyroid ablation    1992  right throid  . History of exercise intolerance    01-052011  ETT--  normal w/ no ischemia per cardiologist (dr Aundra Dubin) epic note  . History of pulmonary embolus (PE)    07/ 2008-- treated w/ coumadin  . History of syncope    recurrent --  workup done per epic -- dx orthostatic hypotension  . Hypertension   . IBS (irritable bowel syndrome)   . Pelvic pain in female   . 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 Proteinuria   . Seasonal and perennial  allergic rhinitis   . Wears contact lenses     Past Surgical History:  Procedure Laterality Date  . [place] STUDY N/A 11/16/2016   Procedure: [place] STUDY;  Surgeon: Doran Stabler, MD;  [facility];  Service: Gastroenterology;  Laterality: N/A;  . ABDOMINAL HYSTERECTOMY  1998  . CYSTOSCOPY N/A 04/28/2016   Procedure: CYSTOSCOPY;  Surgeon: Arvella Nigh, MD;  Location: [place] [facility];  Service: Gynecology;  Laterality: N/A;  . ESOPHAGEAL MANOMETRY N/A 11/16/2016   Procedure: ESOPHAGEAL MANOMETRY (EM);  Surgeon: Doran Stabler, MD;  [facility];  Service: Gastroenterology;  Laterality: N/A;  . ESOPHAGOGASTRODUODENOSCOPY    . LAPAROSCOPIC CHOLECYSTECTOMY  1997  . LAPAROSCOPY N/A 03/26/2016   Procedure: LAPAROSCOPY DIAGNOSTIC, LYSIS OF ADHESIONS;  Surgeon: Arvella Nigh, MD;  Location: [place];  Service: Gynecology;  Laterality: N/A;  . LAPAROTOMY N/A 04/28/2016   Procedure: EXPLORATORY LAPAROTOMY;  Surgeon: Arvella Nigh, MD;  Location: [facility];  Service: Gynecology;  Laterality: N/A;  . RENAL BIOPSY    . SALPINGOOPHORECTOMY Bilateral 04/28/2016   Procedure: Bilateral OOPHORECTOMY;  Surgeon: Arvella Nigh, MD;  Location: [facility];  Service: Gynecology;  Laterality: Bilateral;  . THYROIDECTOMY Left 1991  . TRANSTHORACIC ECHOCARDIOGRAM  04/27/2012   normal LV, ef 60-65%/  trivial MR  . TUBAL LIGATION  1992    Social History:  reports that she has never smoked. She has never used smokeless  tobacco. She reports that she does not drink alcohol or use drugs.  Allergies: Allergies  Allergen Reactions  . Aspirin Hives  . Flagyl [Metronidazole] Nausea And Vomiting    With oral flagyl.   Has tolerated vaginal flagyl.    . Food Other (See Comments)    Lima beans--  Positive allergy test   . Tramadol Other (See Comments)    Headache   . Dilaudid [Hydromorphone] Itching    Family History:  Family History  Problem Relation Age of Onset  . Hypertension Father    . Coronary artery disease Father   . Diabetes Father   . Stroke Father   . Prostate cancer Father   . Diabetes Mother        Pre-diabetic  . Hypertension Mother   . Breast cancer Maternal Aunt   . Thyroid disease Maternal Aunt   . Colon cancer Neg Hx   . Colon polyps Neg Hx   . Kidney disease Neg Hx   . Esophageal cancer Neg Hx   . Gallbladder disease Neg Hx      Current Outpatient Medications:  .  albuterol (PROVENTIL HFA;VENTOLIN HFA) 108 (90 Base) MCG/ACT inhaler, INHALE 2 PUFFS INTO THE LUNGS EVERY 6 HOURS AS NEEDED, Disp: 6.7 g, Rfl: 1 .  ALPRAZolam (XANAX) 0.25 MG tablet, Take 1 tablet (0.25 mg total) by mouth 2 (two) times daily as needed for anxiety., Disp: 60 tablet, Rfl: 0 .  ipratropium (ATROVENT) 0.06 % nasal spray, Place 2 sprays into both nostrils 4 (four) times daily., Disp: 15 mL, Rfl: 0 .  levothyroxine (SYNTHROID, LEVOTHROID) 112 MCG tablet, Take 1 tablet (112 mcg total) by mouth daily., Disp: 90 tablet, Rfl: 0 .  metoprolol succinate (TOPROL-XL) 50 MG 24 hr tablet, TAKE 1 TABLET BY MOUTH EVERY DAY IMMEDIATELY FOLLOWING A MEAL, Disp: 90 tablet, Rfl: 0 .  olmesartan-hydrochlorothiazide (BENICAR HCT) 40-25 MG tablet, Take 1 tablet by mouth daily., Disp: 90 tablet, Rfl: 3 .  omeprazole (PRILOSEC) 40 MG capsule, Take 1 capsule (40 mg total) by mouth daily., Disp: 90 capsule, Rfl: 0 .  promethazine (PHENERGAN) 25 MG tablet, 1 tab every 8 hours as needed for nausea due to headache, Disp: 20 tablet, Rfl: 0  Review of Systems:  Constitutional: Denies fever, chills, diaphoresis, appetite change and fatigue.  HEENT: Denies photophobia, eye pain, redness, hearing loss, ear pain, congestion, sore throat, rhinorrhea, sneezing, mouth sores, trouble swallowing, neck pain, neck stiffness and tinnitus.   Respiratory: Denies SOB, DOE, cough, chest tightness,  and wheezing.   Cardiovascular: Denies chest pain, palpitations and leg swelling.  Gastrointestinal: Denies nausea, vomiting,  abdominal pain, diarrhea, constipation, blood in stool and abdominal distention.  Genitourinary: Denies dysuria, urgency, frequency, hematuria, flank pain and difficulty urinating.  Endocrine: Denies: hot or cold intolerance, sweats, changes in hair or nails, polyuria, polydipsia. Musculoskeletal: Denies myalgias, back pain, joint swelling, arthralgias and gait problem.  Skin: Denies pallor, rash and wound.  Neurological: Denies dizziness, seizures, syncope, weakness, light-headedness, numbness and headaches.  Hematological: Denies adenopathy. Easy bruising, personal or family bleeding history  Psychiatric/Behavioral: Denies suicidal ideation, mood changes, confusion, nervousness, sleep disturbance and agitation    Physical Exam: Vitals:   10/04/18 1625  BP: 120/82  Pulse: 90  Temp: 98.3 F (36.8 C)  TempSrc: Oral  SpO2: 97%  Weight: 227 lb 11.2 oz (103.3 kg)    Body mass index is 36.75 kg/m.   Constitutional: NAD, calm, comfortable Eyes: PERRL, lids and conjunctivae normal ENMT: Mucous membranes  are moist.  Neck: normal, supple, no masses, no thyromegaly, scar from prior thyroidectomy Respiratory: clear to auscultation bilaterally, no wheezing, no crackles. Normal respiratory effort. No accessory muscle use.  Cardiovascular: Regular rate and rhythm, no murmurs / rubs / gallops. No extremity edema. 2+ pedal pulses. No carotid bruits.  Musculoskeletal: no clubbing / cyanosis. No joint deformity upper and lower extremities. Good ROM, no contractures. Normal muscle tone.  Neurologic: CN 2-12 grossly intact. Sensation intact, DTR normal. Strength 5/5 in all 4.  Psychiatric: Normal judgment and insight. Alert and oriented x 3. Normal mood.    Impression and Plan:  Essential hypertension -Currently well controlled on metoprolol, olmesartan and HCTZ. -Recently taken off amlodipine due to low BPs.  Gastroesophageal reflux disease without esophagitis -On PPI, symptomatic when off  meds even for a day.  Postoperative hypothyroidism -On synthroid, currently TSH is depressed and she is working with endocrine on decreasing dose.  Dyslipidemia -No recent lipids on file. -Will check with next CPE.  History of pulmonary embolism -in 2008; completed 6 months of anticoagulation.    Patient Instructions  Great meeting you this afternoon.  Instructions: -PHQ-9 today (depression screening). -Schedule follow up in 6 months for you physical. Please come in fasting to that appointment.      Lelon Frohlich, MD [facility]

## 2018-10-05 ENCOUNTER — Ambulatory Visit: Payer: No Typology Code available for payment source | Admitting: Gastroenterology

## 2018-10-24 ENCOUNTER — Other Ambulatory Visit: Payer: Self-pay | Admitting: Family Medicine

## 2018-10-25 ENCOUNTER — Encounter: Payer: Self-pay | Admitting: Internal Medicine

## 2018-11-01 ENCOUNTER — Other Ambulatory Visit: Payer: Self-pay | Admitting: Internal Medicine

## 2018-11-03 ENCOUNTER — Ambulatory Visit: Payer: No Typology Code available for payment source | Admitting: Internal Medicine

## 2018-11-04 ENCOUNTER — Other Ambulatory Visit: Payer: Self-pay

## 2018-11-04 ENCOUNTER — Ambulatory Visit: Payer: No Typology Code available for payment source | Admitting: Internal Medicine

## 2018-11-04 ENCOUNTER — Encounter: Payer: Self-pay | Admitting: Internal Medicine

## 2018-11-04 VITALS — BP 110/70 | HR 73 | Temp 98.4°F | Wt 226.2 lb

## 2018-11-04 DIAGNOSIS — R300 Dysuria: Secondary | ICD-10-CM

## 2018-11-04 DIAGNOSIS — N3000 Acute cystitis without hematuria: Secondary | ICD-10-CM | POA: Diagnosis not present

## 2018-11-04 LAB — URINALYSIS, ROUTINE W REFLEX MICROSCOPIC
Bilirubin Urine: NEGATIVE
Hgb urine dipstick: NEGATIVE
Ketones, ur: NEGATIVE
Nitrite: POSITIVE — AB
Specific Gravity, Urine: 1.025 (ref 1.000–1.030)
Total Protein, Urine: NEGATIVE
Urine Glucose: NEGATIVE
Urobilinogen, UA: 0.2 (ref 0.0–1.0)
pH: 6 (ref 5.0–8.0)

## 2018-11-04 LAB — POCT URINALYSIS DIPSTICK
Bilirubin, UA: NEGATIVE
Blood, UA: NEGATIVE
Glucose, UA: NEGATIVE
KETONES UA: NEGATIVE
Nitrite, UA: POSITIVE
Protein, UA: POSITIVE — AB
Spec Grav, UA: 1.015 (ref 1.010–1.025)
Urobilinogen, UA: 0.2 E.U./dL
pH, UA: 6 (ref 5.0–8.0)

## 2018-11-04 MED ORDER — PROMETHAZINE HCL 25 MG PO TABS: ORAL_TABLET | ORAL | 0 refills | Status: DC

## 2018-11-04 MED ORDER — SULFAMETHOXAZOLE-TRIMETHOPRIM 800-160 MG PO TABS: 1.0000 | ORAL_TABLET | Freq: Two times a day (BID) | ORAL | 0 refills | Status: AC

## 2018-11-04 NOTE — Progress Notes (Signed)
Acute Office Visit     CC/Reason for Visit: dysuria  HPI: Rhonda [name] is a 48 y.o. female who is coming in today for the above mentioned reasons. Past 4 days with increased urinary frequency, progressed to dysuria and suprapubic pain. She has not had fevers or chills. No n/v.   Past Medical/Surgical History: Past Medical History:  Diagnosis Date  . Anxiety   . C1q nephropathy   . Cancer [facility])    history of thyroid cancer  . Chronic constipation   . CKD (chronic kidney disease), stage II nephrologist-  dr Gretchen Short [facility])   secondary to C1q nephropathy/  hypertension  . Family history of adverse reaction to anesthesia    father hard to put down for anesthesia  . GERD (gastroesophageal reflux disease)   . H/O radioactive iodine thyroid ablation    1992  right throid  . History of exercise intolerance    01-052011  ETT--  normal w/ no ischemia per cardiologist (dr Aundra Dubin) epic note  . History of pulmonary embolus (PE)    07/ 2008-- treated w/ coumadin  . History of syncope    recurrent --  workup done per epic -- dx orthostatic hypotension  . Hypertension   . IBS (irritable bowel syndrome)   . Pelvic pain in female   . 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 Proteinuria   . Seasonal and perennial allergic rhinitis   . Wears contact lenses     Past Surgical History:  Procedure Laterality Date  . [place] STUDY N/A 11/16/2016   Procedure: [place] STUDY;  Surgeon: Doran Stabler, MD;  [facility];  Service: Gastroenterology;  Laterality: N/A;  . ABDOMINAL HYSTERECTOMY  1998  . CYSTOSCOPY N/A 04/28/2016   Procedure: CYSTOSCOPY;  Surgeon: Arvella Nigh, MD;  Location: [place] [facility];  Service: Gynecology;  Laterality: N/A;  . ESOPHAGEAL MANOMETRY N/A 11/16/2016   Procedure: ESOPHAGEAL MANOMETRY (EM);  Surgeon: Doran Stabler, MD;  [facility];  Service: Gastroenterology;  Laterality: N/A;   . ESOPHAGOGASTRODUODENOSCOPY    . LAPAROSCOPIC CHOLECYSTECTOMY  1997  . LAPAROSCOPY N/A 03/26/2016   Procedure: LAPAROSCOPY DIAGNOSTIC, LYSIS OF ADHESIONS;  Surgeon: Arvella Nigh, MD;  Location: [place];  Service: Gynecology;  Laterality: N/A;  . LAPAROTOMY N/A 04/28/2016   Procedure: EXPLORATORY LAPAROTOMY;  Surgeon: Arvella Nigh, MD;  Location: [facility] [facility];  Service: Gynecology;  Laterality: N/A;  . RENAL BIOPSY    . SALPINGOOPHORECTOMY Bilateral 04/28/2016   Procedure: Bilateral OOPHORECTOMY;  Surgeon: Arvella Nigh, MD;  Location: [facility];  Service: Gynecology;  Laterality: Bilateral;  . THYROIDECTOMY Left 1991  . TRANSTHORACIC ECHOCARDIOGRAM  04/27/2012   normal LV, ef 60-65%/  trivial MR  . TUBAL LIGATION  1992    Social History:  reports that she has never smoked. She has never used smokeless tobacco. She reports that she does not drink alcohol or use drugs.  Allergies: Allergies  Allergen Reactions  . Aspirin Hives  . Flagyl [Metronidazole] Nausea And Vomiting    With oral flagyl.   Has tolerated vaginal flagyl.    . Food Other (See Comments)    Lima beans--  Positive allergy test   . Tramadol Other (See Comments)    Headache   . Dilaudid [Hydromorphone] Itching    Family History:  Family History  Problem Relation Age of Onset  . Hypertension Father   . Coronary artery disease Father   .  Diabetes Father   . Stroke Father   . Prostate cancer Father   . Diabetes Mother        Pre-diabetic  . Hypertension Mother   . Breast cancer Maternal Aunt   . Thyroid disease Maternal Aunt   . Colon cancer Neg Hx   . Colon polyps Neg Hx   . Kidney disease Neg Hx   . Esophageal cancer Neg Hx   . Gallbladder disease Neg Hx      Current Outpatient Medications:  .  albuterol (PROVENTIL HFA;VENTOLIN HFA) 108 (90 Base) MCG/ACT inhaler, INHALE 2 PUFFS INTO THE LUNGS EVERY 6 HOURS AS NEEDED, Disp: 6.7 g, Rfl: 1 .  ALPRAZolam (XANAX) 0.25 MG tablet, Take 1 tablet (0.25 mg  total) by mouth 2 (two) times daily as needed for anxiety., Disp: 60 tablet, Rfl: 0 .  ipratropium (ATROVENT) 0.06 % nasal spray, Place 2 sprays into both nostrils 4 (four) times daily., Disp: 15 mL, Rfl: 0 .  levothyroxine (SYNTHROID, LEVOTHROID) 112 MCG tablet, Take 1 tablet (112 mcg total) by mouth daily., Disp: 90 tablet, Rfl: 0 .  metoprolol succinate (TOPROL-XL) 50 MG 24 hr tablet, TAKE 1 TABLET BY MOUTH EVERY DAY IMMEDIATELY FOLLOWING A MEAL, Disp: 90 tablet, Rfl: 1 .  olmesartan-hydrochlorothiazide (BENICAR HCT) 40-25 MG tablet, Take 1 tablet by mouth daily., Disp: 90 tablet, Rfl: 3 .  omeprazole (PRILOSEC) 40 MG capsule, Take 1 capsule (40 mg total) by mouth daily., Disp: 90 capsule, Rfl: 0 .  promethazine (PHENERGAN) 25 MG tablet, 1 tab every 8 hours as needed for nausea due to headache, Disp: 20 tablet, Rfl: 0 .  sulfamethoxazole-trimethoprim (BACTRIM DS,SEPTRA DS) 800-160 MG tablet, Take 1 tablet by mouth 2 (two) times daily for 5 days., Disp: 10 tablet, Rfl: 0  Review of Systems:  Constitutional: Denies fever, chills, diaphoresis, appetite change and fatigue.  HEENT: Denies photophobia, eye pain, redness, hearing loss, ear pain, congestion, sore throat, rhinorrhea, sneezing, mouth sores, trouble swallowing, neck pain, neck stiffness and tinnitus.   Respiratory: Denies SOB, DOE, cough, chest tightness,  and wheezing.   Cardiovascular: Denies chest pain, palpitations and leg swelling.  Gastrointestinal: Denies nausea, vomiting, abdominal pain, diarrhea, constipation, blood in stool and abdominal distention.  Genitourinary: Denies flank pain and difficulty urinating.  Endocrine: Denies: hot or cold intolerance, sweats, changes in hair or nails, polyuria, polydipsia. Musculoskeletal: Denies myalgias, back pain, joint swelling, arthralgias and gait problem.  Skin: Denies pallor, rash and wound.  Neurological: Denies dizziness, seizures, syncope, weakness, light-headedness, numbness and  headaches.  Hematological: Denies adenopathy. Easy bruising, personal or family bleeding history  Psychiatric/Behavioral: Denies suicidal ideation, mood changes, confusion, nervousness, sleep disturbance and agitation    Physical Exam: Vitals:   11/04/18 0921  BP: 110/70  Pulse: 73  Temp: 98.4 F (36.9 C)  TempSrc: Oral  SpO2: 97%  Weight: 226 lb 3.2 oz (102.6 kg)    Body mass index is 36.51 kg/m.   Constitutional: NAD, calm, comfortable Eyes: PERRL, lids and conjunctivae normal ENMT: Mucous membranes are moist.  Abdomen: no tenderness, no masses palpated. No hepatosplenomegaly. Bowel sounds positive.  Musculoskeletal: no clubbing / cyanosis. No joint deformity upper and lower extremities. Good ROM, no contractures. Normal muscle tone.    Impression and Plan:  Burning with urination - Acute cystitis without hematuria -  -Urine dipstick with protein and leukocytes. -Start empiric treatment of UTI with Bactrim DS for 5 days. -UA and cx sent. -Will RTC if no improvement in 10-14 days.  Patient Instructions  -Hope you fell better soon!  -Bactrim DS twice daily for 5 days.   Urinary Tract Infection, Adult A urinary tract infection (UTI) is an infection of any part of the urinary tract. The urinary tract includes:  The kidneys.  The ureters.  The bladder.  The urethra. These organs make, store, and get rid of pee (urine) in the body. What are the causes? This is caused by germs (bacteria) in your genital area. These germs grow and cause swelling (inflammation) of your urinary tract. What increases the risk? You are more likely to develop this condition if:  You have a small, thin tube (catheter) to drain pee.  You cannot control when you pee or poop (incontinence).  You are female, and: ? You use these methods to prevent pregnancy: ? A medicine that kills sperm (spermicide). ? A device that blocks sperm (diaphragm). ? You have low levels of a female  hormone (estrogen). ? You are pregnant.  You have genes that add to your risk.  You are sexually active.  You take antibiotic medicines.  You have trouble peeing because of: ? A prostate that is bigger than normal, if you are female. ? A blockage in the part of your body that drains pee from the bladder (urethra). ? A kidney stone. ? A nerve condition that affects your bladder (neurogenic bladder). ? Not getting enough to drink. ? Not peeing often enough.  You have other conditions, such as: ? Diabetes. ? A weak disease-fighting system (immune system). ? Sickle cell disease. ? Gout. ? Injury of the spine. What are the signs or symptoms? Symptoms of this condition include:  Needing to pee right away (urgently).  Peeing often.  Peeing small amounts often.  Pain or burning when peeing.  Blood in the pee.  Pee that smells bad or not like normal.  Trouble peeing.  Pee that is cloudy.  Fluid coming from the vagina, if you are female.  Pain in the belly or lower back. Other symptoms include:  Throwing up (vomiting).  No urge to eat.  Feeling mixed up (confused).  Being tired and grouchy (irritable).  A fever.  Watery poop (diarrhea). How is this treated? This condition may be treated with:  Antibiotic medicine.  Other medicines.  Drinking enough water. Follow these instructions at home:  Medicines  Take over-the-counter and prescription medicines only as told by your doctor.  If you were prescribed an antibiotic medicine, take it as told by your doctor. Do not stop taking it even if you start to feel better. General instructions  Make sure you: ? Pee until your bladder is empty. ? Do not hold pee for a long time. ? Empty your bladder after sex. ? Wipe from front to back after pooping if you are a female. Use each tissue one time when you wipe.  Drink enough fluid to keep your pee pale yellow.  Keep all follow-up visits as told by your doctor.  This is important. Contact a doctor if:  You do not get better after 1-2 days.  Your symptoms go away and then come back. Get help right away if:  You have very bad back pain.  You have very bad pain in your lower belly.  You have a fever.  You are sick to your stomach (nauseous).  You are throwing up. Summary  A urinary tract infection (UTI) is an infection of any part of the urinary tract.  This condition is caused by germs  in your genital area.  There are many risk factors for a UTI. These include having a small, thin tube to drain pee and not being able to control when you pee or poop.  Treatment includes antibiotic medicines for germs.  Drink enough fluid to keep your pee pale yellow. This information is not intended to replace advice given to you by your health care provider. Make sure you discuss any questions you have with your health care provider. Document Released: 01/20/2008 Document Revised: 02/10/2018 Document Reviewed: 02/10/2018 Elsevier Interactive Patient Education  2019 [place], [place] [facility]

## 2018-11-04 NOTE — Patient Instructions (Signed)
-Hope you fell better soon!  -Bactrim DS twice daily for 5 days.   Urinary Tract Infection, Adult A urinary tract infection (UTI) is an infection of any part of the urinary tract. The urinary tract includes:  The kidneys.  The ureters.  The bladder.  The urethra. These organs make, store, and get rid of pee (urine) in the body. What are the causes? This is caused by germs (bacteria) in your genital area. These germs grow and cause swelling (inflammation) of your urinary tract. What increases the risk? You are more likely to develop this condition if:  You have a small, thin tube (catheter) to drain pee.  You cannot control when you pee or poop (incontinence).  You are female, and: ? You use these methods to prevent pregnancy: ? A medicine that kills sperm (spermicide). ? A device that blocks sperm (diaphragm). ? You have low levels of a female hormone (estrogen). ? You are pregnant.  You have genes that add to your risk.  You are sexually active.  You take antibiotic medicines.  You have trouble peeing because of: ? A prostate that is bigger than normal, if you are female. ? A blockage in the part of your body that drains pee from the bladder (urethra). ? A kidney stone. ? A nerve condition that affects your bladder (neurogenic bladder). ? Not getting enough to drink. ? Not peeing often enough.  You have other conditions, such as: ? Diabetes. ? A weak disease-fighting system (immune system). ? Sickle cell disease. ? Gout. ? Injury of the spine. What are the signs or symptoms? Symptoms of this condition include:  Needing to pee right away (urgently).  Peeing often.  Peeing small amounts often.  Pain or burning when peeing.  Blood in the pee.  Pee that smells bad or not like normal.  Trouble peeing.  Pee that is cloudy.  Fluid coming from the vagina, if you are female.  Pain in the belly or lower back. Other symptoms include:  Throwing up  (vomiting).  No urge to eat.  Feeling mixed up (confused).  Being tired and grouchy (irritable).  A fever.  Watery poop (diarrhea). How is this treated? This condition may be treated with:  Antibiotic medicine.  Other medicines.  Drinking enough water. Follow these instructions at home:  Medicines  Take over-the-counter and prescription medicines only as told by your doctor.  If you were prescribed an antibiotic medicine, take it as told by your doctor. Do not stop taking it even if you start to feel better. General instructions  Make sure you: ? Pee until your bladder is empty. ? Do not hold pee for a long time. ? Empty your bladder after sex. ? Wipe from front to back after pooping if you are a female. Use each tissue one time when you wipe.  Drink enough fluid to keep your pee pale yellow.  Keep all follow-up visits as told by your doctor. This is important. Contact a doctor if:  You do not get better after 1-2 days.  Your symptoms go away and then come back. Get help right away if:  You have very bad back pain.  You have very bad pain in your lower belly.  You have a fever.  You are sick to your stomach (nauseous).  You are throwing up. Summary  A urinary tract infection (UTI) is an infection of any part of the urinary tract.  This condition is caused by germs in your genital area.  There are many risk factors for a UTI. These include having a small, thin tube to drain pee and not being able to control when you pee or poop.  Treatment includes antibiotic medicines for germs.  Drink enough fluid to keep your pee pale yellow. This information is not intended to replace advice given to you by your health care provider. Make sure you discuss any questions you have with your health care provider. Document Released: 01/20/2008 Document Revised: 02/10/2018 Document Reviewed: 02/10/2018 Elsevier Interactive Patient Education  2019 [employer].

## 2018-11-04 NOTE — Addendum Note (Signed)
Addended by: Gwynne Edinger on: 11/04/2018 10:17 AM   Modules accepted: Orders

## 2018-11-04 NOTE — Addendum Note (Signed)
Addended by: Westley Hummer B on: 11/04/2018 10:16 AM   Modules accepted: Orders

## 2018-11-06 LAB — URINE CULTURE
MICRO NUMBER:: 341130
SPECIMEN QUALITY:: ADEQUATE

## 2018-11-07 ENCOUNTER — Encounter: Payer: No Typology Code available for payment source | Admitting: Family Medicine

## 2018-11-24 ENCOUNTER — Other Ambulatory Visit: Payer: Self-pay | Admitting: Family Medicine

## 2018-11-27 ENCOUNTER — Other Ambulatory Visit: Payer: Self-pay | Admitting: Endocrinology

## 2018-11-27 DIAGNOSIS — E890 Postprocedural hypothyroidism: Secondary | ICD-10-CM

## 2018-11-29 ENCOUNTER — Other Ambulatory Visit (INDEPENDENT_AMBULATORY_CARE_PROVIDER_SITE_OTHER): Payer: No Typology Code available for payment source

## 2018-11-29 ENCOUNTER — Other Ambulatory Visit: Payer: Self-pay

## 2018-11-29 DIAGNOSIS — E890 Postprocedural hypothyroidism: Secondary | ICD-10-CM

## 2018-11-29 LAB — BASIC METABOLIC PANEL
BUN: 28 mg/dL — ABNORMAL HIGH (ref 6–23)
CO2: 29 mEq/L (ref 19–32)
Calcium: 10.4 mg/dL (ref 8.4–10.5)
Chloride: 105 mEq/L (ref 96–112)
Creatinine, Ser: 1.55 mg/dL — ABNORMAL HIGH (ref 0.40–1.20)
GFR: 42.86 mL/min — ABNORMAL LOW (ref >60.00)
Glucose, Bld: 100 mg/dL — ABNORMAL HIGH (ref 70–99)
Potassium: 4.1 mEq/L (ref 3.5–5.1)
Sodium: 141 mEq/L (ref 135–145)

## 2018-11-29 LAB — T4, FREE: Free T4: 0.89 ng/dL (ref 0.60–1.60)

## 2018-11-29 LAB — TSH: TSH: 0.57 u[IU]/mL (ref 0.35–4.50)

## 2018-11-30 ENCOUNTER — Other Ambulatory Visit: Payer: Self-pay | Admitting: Internal Medicine

## 2018-11-30 ENCOUNTER — Encounter: Payer: Self-pay | Admitting: Internal Medicine

## 2018-12-01 ENCOUNTER — Encounter: Payer: Self-pay | Admitting: Internal Medicine

## 2018-12-01 MED ORDER — OMEPRAZOLE 40 MG PO CPDR: 40.0000 mg | DELAYED_RELEASE_CAPSULE | Freq: Every day | ORAL | 1 refills | Status: DC

## 2018-12-02 ENCOUNTER — Other Ambulatory Visit: Payer: Self-pay

## 2018-12-02 ENCOUNTER — Ambulatory Visit (INDEPENDENT_AMBULATORY_CARE_PROVIDER_SITE_OTHER): Payer: No Typology Code available for payment source | Admitting: Endocrinology

## 2018-12-02 ENCOUNTER — Encounter: Payer: Self-pay | Admitting: Endocrinology

## 2018-12-02 DIAGNOSIS — E890 Postprocedural hypothyroidism: Secondary | ICD-10-CM | POA: Diagnosis not present

## 2018-12-02 MED ORDER — LEVOTHYROXINE SODIUM 112 MCG PO TABS: ORAL_TABLET | ORAL | 1 refills | Status: DC

## 2018-12-02 NOTE — Progress Notes (Addendum)
Patient ID: Kennedie Pardoe, female   DOB: July 14, 1971, 48 y.o.   MRN: [id number]             Reason for Appointment:  Hypothyroidism, follow-up visit  Today's office visit was provided via telemedicine via telephone call Explained to the patient and the the limitations of evaluation and management by telemedicine and the availability of in person appointments.   Interactive audio and video telecommunications were attempted between this provider and patient, however failed, due to patient having technical difficulties  The patient understood the limitations and agreed to proceed.We continued and completed visit with audio only.  Patient also understood that the telehealth visit is billable. . Location of the patient: Home . Location of the provider: Office Only the patient and myself were participating in the encounter    History of Present Illness:   Hypothyroidism was first diagnosed in 1991  Hypothyroidism apparently was secondary to surgery on her goiter and no details are available She was told that after the surgery there was a focus of cancer found in her thyroid but again no details available She subsequently had radioactive iodine treatment         The patient has been treated with variable doses of levothyroxine  To her initial consultation she had been on a stable dose of 150 mcg levothyroxine and apparently was feeling fairly good with this    However subsequently since 05/2018 despite no new symptoms at that time her TSH was 0.01 No prior levels of TSH are available on her record since 2016  RECENT HISTORY:  Her levothyroxine dose had been adjusted because of persistently low TSH in 2019 and early 2020 She is now only taking 112 mcg daily, no change was made in 2/20 as she had just reduced her dose and she was asymptomatic with normal free T4 and T3 levels  Currently she is feeling fairly good with no unusual fatigue, weight change, heat or cold intolerance  The patient takes the thyroid supplement before breakfast and is taking it regularly every day She also takes her other medications and Prilosec at the same time         Her TSH is improved at 0.6 with normal free T4 level  Patient's weight history is as follows:  Wt Readings from Last 3 Encounters:  11/04/18 226 lb 3.2 oz (102.6 kg)  10/04/18 227 lb 11.2 oz (103.3 kg)  09/27/18 225 lb (102.1 kg)    Thyroid function results have been as follows:  Lab Results  Component Value Date   TSH 0.57 11/29/2018   TSH 0.14 (L) 09/27/2018   TSH 0.04 (L) 09/02/2018   TSH 0.08 (L) 08/05/2018   FREET4 0.89 11/29/2018   FREET4 0.97 09/27/2018   FREET4 1.22 09/02/2018   FREET4 0.98 08/05/2018   T3FREE 3.7 09/27/2018   T3FREE 3.2 09/02/2018   HYPERCALCEMIA:  She had a high calcium in October 2019 However review of her medications indicates that she was previously not on HCTZ and has been on 25 mg HCTZ since middle of 2019  PTH level done from [facility] lab was high but was normal from [facility]  Subsequently calcium levels have been in the upper normal range  Lab Results  Component Value Date   CALCIUM 10.4 11/29/2018   CALCIUM 10.3 09/27/2018   CALCIUM 10.0 08/05/2018   CALCIUM 10.6 (H) 05/31/2018   CALCIUM 9.7 03/27/2017   CALCIUM 9.9 07/11/2016   CALCIUM 9.9 05/27/2016   Lab Results  Component Value  Date   PTH 32 09/27/2018   CALCIUM 10.4 11/29/2018   CAION 5.40 08/05/2018     Past Medical History:  Diagnosis Date  . Anxiety   . C1q nephropathy   . Cancer [facility])    history of thyroid cancer  . Chronic constipation   . CKD (chronic kidney disease), stage II nephrologist-  dr Gretchen Short [facility])   secondary to C1q nephropathy/  hypertension  . Family history of adverse reaction to anesthesia    father hard to put down for anesthesia  . GERD (gastroesophageal reflux disease)   . H/O radioactive iodine thyroid ablation    1992  right throid  .  History of exercise intolerance    01-052011  ETT--  normal w/ no ischemia per cardiologist (dr Aundra Dubin) epic note  . History of pulmonary embolus (PE)    07/ 2008-- treated w/ coumadin  . History of syncope    recurrent --  workup done per epic -- dx orthostatic hypotension  . Hypertension   . IBS (irritable bowel syndrome)   . Pelvic pain in female   . 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 Proteinuria   . Seasonal and perennial allergic rhinitis   . Wears contact lenses     Past Surgical History:  Procedure Laterality Date  . [place] STUDY N/A 11/16/2016   Procedure: [place] STUDY;  Surgeon: Doran Stabler, MD;  [facility];  Service: Gastroenterology;  Laterality: N/A;  . ABDOMINAL HYSTERECTOMY  1998  . CYSTOSCOPY N/A 04/28/2016   Procedure: CYSTOSCOPY;  Surgeon: Arvella Nigh, MD;  Location: [place] [facility];  Service: Gynecology;  Laterality: N/A;  . ESOPHAGEAL MANOMETRY N/A 11/16/2016   Procedure: ESOPHAGEAL MANOMETRY (EM);  Surgeon: Doran Stabler, MD;  [facility];  Service: Gastroenterology;  Laterality: N/A;  . ESOPHAGOGASTRODUODENOSCOPY    . LAPAROSCOPIC CHOLECYSTECTOMY  1997  . LAPAROSCOPY N/A 03/26/2016   Procedure: LAPAROSCOPY DIAGNOSTIC, LYSIS OF ADHESIONS;  Surgeon: Arvella Nigh, MD;  Location: [place];  Service: Gynecology;  Laterality: N/A;  . LAPAROTOMY N/A 04/28/2016   Procedure: EXPLORATORY LAPAROTOMY;  Surgeon: Arvella Nigh, MD;  Location: [facility];  Service: Gynecology;  Laterality: N/A;  . RENAL BIOPSY    . SALPINGOOPHORECTOMY Bilateral 04/28/2016   Procedure: Bilateral OOPHORECTOMY;  Surgeon: Arvella Nigh, MD;  Location: [place] [facility];  Service: Gynecology;  Laterality: Bilateral;  . THYROIDECTOMY Left 1991  . TRANSTHORACIC ECHOCARDIOGRAM  04/27/2012   normal LV, ef 60-65%/  trivial MR  . TUBAL LIGATION  1992    Family History  Problem Relation Age of Onset  . Hypertension Father   . Coronary artery disease  Father   . Diabetes Father   . Stroke Father   . Prostate cancer Father   . Diabetes Mother        Pre-diabetic  . Hypertension Mother   . Breast cancer Maternal Aunt   . Thyroid disease Maternal Aunt   . Colon cancer Neg Hx   . Colon polyps Neg Hx   . Kidney disease Neg Hx   . Esophageal cancer Neg Hx   . Gallbladder disease Neg Hx     Social History:  reports that she has never smoked. She has never used smokeless tobacco. She reports that she does not drink alcohol or use drugs.  Allergies:  Allergies  Allergen Reactions  . Aspirin Hives  . Flagyl [Metronidazole] Nausea And Vomiting    With  oral flagyl.   Has tolerated vaginal flagyl.    . Food Other (See Comments)    Lima beans--  Positive allergy test   . Tramadol Other (See Comments)    Headache   . Dilaudid [Hydromorphone] Itching    Allergies as of 12/02/2018      Reactions   Aspirin Hives   Flagyl [metronidazole] Nausea And Vomiting   With oral flagyl.   Has tolerated vaginal flagyl.     Food Other (See Comments)   Lima beans--  Positive allergy test    Tramadol Other (See Comments)   Headache    Dilaudid [hydromorphone] Itching      Medication List       Accurate as of December 02, 2018 12:03 PM. Always use your most recent med list.        albuterol 108 (90 Base) MCG/ACT inhaler Commonly known as:  VENTOLIN HFA INHALE 2 PUFFS INTO THE LUNGS EVERY 6 HOURS AS NEEDED   ALPRAZolam 0.25 MG tablet Commonly known as:  XANAX Take 1 tablet (0.25 mg total) by mouth 2 (two) times daily as needed for anxiety.   ipratropium 0.06 % nasal spray Commonly known as:  Atrovent Place 2 sprays into both nostrils 4 (four) times daily.   levothyroxine 112 MCG tablet Commonly known as:  SYNTHROID TAKE 1 TABLET(112 MCG) BY MOUTH DAILY   metoprolol succinate 50 MG 24 hr tablet Commonly known as:  TOPROL-XL TAKE 1 TABLET BY MOUTH EVERY DAY IMMEDIATELY FOLLOWING A MEAL   olmesartan-hydrochlorothiazide 40-25 MG tablet  Commonly known as:  BENICAR HCT Take 1 tablet by mouth daily.   omeprazole 40 MG capsule Commonly known as:  PRILOSEC Take 1 capsule (40 mg total) by mouth daily.   promethazine 25 MG tablet Commonly known as:  PHENERGAN 1 tab every 8 hours as needed for nausea due to headache          Review of Systems  Blood pressure has been followed by PCP         Examination:    There were no vitals taken for this visit.    Assessment:  HYPOTHYROIDISM, postsurgical  Her levothyroxine requirement has dropped significantly over the last year although had not been followed regularly previously Now with taking 112 mcg her TSH is back to normal and may continue to equilibrate Subjectively doing well and she is taking her levothyroxine consistently as directed   HYPERCALCEMIA: Calcium levels are high normal consistently and likely related to HCTZ Last PTH level was 32, may or may not have mild hyperparathyroidism Currently asymptomatic   PLAN:  Continue same dose of 112 mcg levothyroxine Follow-up in 4 months with repeat thyroid levels    Elayne Snare 12/02/2018, 12:03 PM   Total telephone visit time = 10 minutes  Note: This office note was prepared with Dragon voice recognition system technology. Any transcriptional errors that result from this process are unintentional.  Addendum: TSH still low but improving and may take longer to equilibrate. We will continue same dose of levothyroxine Calcium 10.3 and PTH pending Creatinine relatively higher and will defer management to PCP  Elayne Snare

## 2018-12-05 ENCOUNTER — Telehealth: Payer: Self-pay | Admitting: Internal Medicine

## 2018-12-05 NOTE — Telephone Encounter (Signed)
Copied from [place] [id number]. Topic: Quick Communication - Rx Refill/Question >> Dec 05, 2018 10:37 AM Nils Flack wrote: Medication: something for sciatic pain   Has the patient contacted their pharmacy no  (Agent: If no, request that the patient contact the pharmacy for the refill.) (Agent: If yes, when and what did the pharmacy advise?)  Preferred Pharmacy (with phone number or street name): [facility] Pt has seen Dr Raliegh Ip for this issue, but not Dr Jerilee Hoh. Will make virtual appt if needed but wants to see if she can get meds w/o appt   Agent: Please be advised that RX refills may take up to 3 business days. We ask that you follow-up with your pharmacy.

## 2018-12-06 ENCOUNTER — Other Ambulatory Visit: Payer: Self-pay

## 2018-12-06 ENCOUNTER — Ambulatory Visit (INDEPENDENT_AMBULATORY_CARE_PROVIDER_SITE_OTHER): Payer: No Typology Code available for payment source | Admitting: Internal Medicine

## 2018-12-06 ENCOUNTER — Encounter: Payer: Self-pay | Admitting: *Deleted

## 2018-12-06 DIAGNOSIS — M5432 Sciatica, left side: Secondary | ICD-10-CM

## 2018-12-06 MED ORDER — CYCLOBENZAPRINE HCL 5 MG PO TABS: 5.0000 mg | ORAL_TABLET | Freq: Two times a day (BID) | ORAL | 0 refills | Status: DC | PRN

## 2018-12-06 MED ORDER — PREDNISONE 10 MG (21) PO TBPK: ORAL_TABLET | ORAL | 0 refills | Status: DC

## 2018-12-06 NOTE — Progress Notes (Signed)
Virtual Visit via Telephone Note  I connected with Rhonda [name] on 12/06/18 at  2:00 PM EDT by telephone and verified that I am speaking with the correct person using two identifiers.   I discussed the limitations, risks, security and privacy concerns of performing an evaluation and management service by telephone and the availability of in person appointments. I also discussed with the patient that there may be a patient responsible charge related to this service. The patient expressed understanding and agreed to proceed.  We initially attempted to connect via video chat but were unable to due to technical difficulties on the patient's end, so we converted this visit to a phone visit.   Location patient: home Location provider: work office Participants present for the call: patient, provider Patient did not have a visit in the prior 7 days to address this/these issue(s).   History of Present Illness:  She has scheduled this visit to discuss left sided sciatica. She works as a [employer]ar[employer] manager. She has had this problem before. It started again 4 days ago. No discreet injury that she can recall. Pain is in her left buttock area and radiates down to her left leg and foot. In the past she has used prednisone and muscle relaxers effectively.   Observations/Objective: Patient sounds cheerful and well on the phone. I do not appreciate any increased work of breathing. Speech and thought processing are grossly intact. Patient reported vitals: none reported   Current Outpatient Medications:  .  albuterol (PROVENTIL HFA;VENTOLIN HFA) 108 (90 Base) MCG/ACT inhaler, INHALE 2 PUFFS INTO THE LUNGS EVERY 6 HOURS AS NEEDED, Disp: 6.7 g, Rfl: 1 .  ALPRAZolam (XANAX) 0.25 MG tablet, Take 1 tablet (0.25 mg total) by mouth 2 (two) times daily as needed for anxiety., Disp: 60 tablet, Rfl: 0 .  cyclobenzaprine (FLEXERIL) 5 MG tablet, Take 1 tablet (5 mg total) by mouth 2 (two) times daily as needed for  muscle spasms (and back pain)., Disp: 30 tablet, Rfl: 0 .  ipratropium (ATROVENT) 0.06 % nasal spray, Place 2 sprays into both nostrils 4 (four) times daily., Disp: 15 mL, Rfl: 0 .  levothyroxine (SYNTHROID) 112 MCG tablet, TAKE 1 TABLET(112 MCG) BY MOUTH before Breakfast daily, Disp: 90 tablet, Rfl: 1 .  metoprolol succinate (TOPROL-XL) 50 MG 24 hr tablet, TAKE 1 TABLET BY MOUTH EVERY DAY IMMEDIATELY FOLLOWING A MEAL, Disp: 90 tablet, Rfl: 1 .  olmesartan-hydrochlorothiazide (BENICAR HCT) 40-25 MG tablet, Take 1 tablet by mouth daily., Disp: 90 tablet, Rfl: 3 .  omeprazole (PRILOSEC) 40 MG capsule, Take 1 capsule (40 mg total) by mouth daily., Disp: 90 capsule, Rfl: 1 .  predniSONE (STERAPRED UNI-PAK 21 TAB) 10 MG (21) TBPK tablet, Take as directed, Disp: 21 tablet, Rfl: 0 .  promethazine (PHENERGAN) 25 MG tablet, 1 tab every 8 hours as needed for nausea due to headache, Disp: 20 tablet, Rfl: 0  Review of Systems:  Constitutional: Denies fever, chills, diaphoresis, appetite change and fatigue.  HEENT: Denies photophobia, eye pain, redness, hearing loss, ear pain, congestion, sore throat, rhinorrhea, sneezing, mouth sores, trouble swallowing, neck pain, neck stiffness and tinnitus.   Respiratory: Denies SOB, DOE, cough, chest tightness,  and wheezing.   Cardiovascular: Denies chest pain, palpitations and leg swelling.  Gastrointestinal: Denies nausea, vomiting, abdominal pain, diarrhea, constipation, blood in stool and abdominal distention.  Genitourinary: Denies dysuria, urgency, frequency, hematuria, flank pain and difficulty urinating.  Endocrine: Denies: hot or cold intolerance, sweats, changes in hair or nails, polyuria,  polydipsia. Musculoskeletal: Denies myalgias,  joint swelling, arthralgias. Skin: Denies pallor, rash and wound.  Neurological: Denies dizziness, seizures, syncope, weakness, light-headedness, numbness and headaches.  Hematological: Denies adenopathy. Easy bruising,  personal or family bleeding history  Psychiatric/Behavioral: Denies suicidal ideation, mood changes, confusion, nervousness, sleep disturbance and agitation   Assessment and Plan:  Sciatica of left side  -Advised icing, back exercises that we will send via MyChart. -Will prescribe flexeril and a prednisone taper. -Work not has been provided for the remainder of the week.   I discussed the assessment and treatment plan with the patient. The patient was provided an opportunity to ask questions and all were answered. The patient agreed with the plan and demonstrated an understanding of the instructions.   The patient was advised to call back or seek an in-person evaluation if the symptoms worsen or if the condition fails to improve as anticipated.  I provided 23 minutes of non-face-to-face time during this encounter.   Lelon Frohlich, MD [facility]

## 2018-12-06 NOTE — Telephone Encounter (Signed)
Doxy.me appointment made for 12/06/2018

## 2018-12-06 NOTE — Patient Instructions (Signed)
Sciatica    Sciatica is pain, numbness, weakness, or tingling along your sciatic nerve. The sciatic nerve starts in the lower back and goes down the back of each leg. Sciatica happens when this nerve is pinched or has pressure put on it. Sciatica usually goes away on its own or with treatment. Sometimes, sciatica may keep coming back (recur).  Follow these instructions at home:  Medicines  · Take over-the-counter and prescription medicines only as told by your doctor.  · Do not drive or use heavy machinery while taking prescription pain medicine.  Managing pain  · If directed, put ice on the affected area.  ? Put ice in a plastic bag.  ? Place a towel between your skin and the bag.  ? Leave the ice on for 20 minutes, 2-3 times a day.  · After icing, apply heat to the affected area before you exercise or as often as told by your doctor. Use the heat source that your doctor tells you to use, such as a moist heat pack or a heating pad.  ? Place a towel between your skin and the heat source.  ? Leave the heat on for 20-30 minutes.  ? Remove the heat if your skin turns bright red. This is especially important if you are unable to feel pain, heat, or cold. You may have a greater risk of getting burned.  Activity  · Return to your normal activities as told by your doctor. Ask your doctor what activities are safe for you.  ? Avoid activities that make your sciatica worse.  · Take short rests during the day. Rest in a lying or standing position. This is usually better than sitting to rest.  ? When you rest for a long time, do some physical activity or stretching between periods of rest.  ? Avoid sitting for a long time without moving. Get up and move around at least one time each hour.  · Exercise and stretch regularly, as told by your doctor.  · Do not lift anything that is heavier than 10 lb (4.5 kg) while you have symptoms of sciatica.  ? Avoid lifting heavy things even when you do not have symptoms.  ? Avoid lifting  heavy things over and over.  · When you lift objects, always lift in a way that is safe for your body. To do this, you should:  ? Bend your knees.  ? Keep the object close to your body.  ? Avoid twisting.  General instructions  · Use good posture.  ? Avoid leaning forward when you are sitting.  ? Avoid hunching over when you are standing.  · Stay at a healthy weight.  · Wear comfortable shoes that support your feet. Avoid wearing high heels.  · Avoid sleeping on a mattress that is too soft or too hard. You might have less pain if you sleep on a mattress that is firm enough to support your back.  · Keep all follow-up visits as told by your doctor. This is important.  Contact a doctor if:  · You have pain that:  ? Wakes you up when you are sleeping.  ? Gets worse when you lie down.  ? Is worse than the pain you have had in the past.  ? Lasts longer than 4 weeks.  · You lose weight for without trying.  Get help right away if:  · You cannot control when you pee (urinate) or poop (have a bowel movement).  · You   have weakness in any of these areas and it gets worse.  ? Lower back.  ? Lower belly (pelvis).  ? Butt (buttocks).  ? Legs.  · You have redness or swelling of your back.  · You have a burning feeling when you pee.  This information is not intended to replace advice given to you by your health care provider. Make sure you discuss any questions you have with your health care provider.  Document Released: 05/12/2008 Document Revised: 01/09/2016 Document Reviewed: 04/12/2015  Elsevier Interactive Patient Education © 2019 [employer]

## 2018-12-06 NOTE — Telephone Encounter (Signed)
Pt is calling back checking on the status of below message something for sciatic pain

## 2018-12-06 NOTE — Telephone Encounter (Signed)
Appointment to discuss please

## 2019-01-16 ENCOUNTER — Other Ambulatory Visit: Payer: Self-pay | Admitting: Internal Medicine

## 2019-01-16 DIAGNOSIS — Z1231 Encounter for screening mammogram for malignant neoplasm of breast: Secondary | ICD-10-CM

## 2019-02-24 ENCOUNTER — Other Ambulatory Visit: Payer: Self-pay | Admitting: Internal Medicine

## 2019-02-24 DIAGNOSIS — N3000 Acute cystitis without hematuria: Secondary | ICD-10-CM

## 2019-02-24 DIAGNOSIS — R300 Dysuria: Secondary | ICD-10-CM

## 2019-02-28 ENCOUNTER — Encounter: Payer: Self-pay | Admitting: Internal Medicine

## 2019-03-03 ENCOUNTER — Ambulatory Visit: Payer: No Typology Code available for payment source

## 2019-03-21 ENCOUNTER — Encounter: Payer: Self-pay | Admitting: Gastroenterology

## 2019-03-21 ENCOUNTER — Ambulatory Visit (INDEPENDENT_AMBULATORY_CARE_PROVIDER_SITE_OTHER): Payer: No Typology Code available for payment source | Admitting: Gastroenterology

## 2019-03-21 VITALS — BP 92/64 | HR 72 | Temp 98.6°F | Ht 65.5 in | Wt 226.0 lb

## 2019-03-21 DIAGNOSIS — K582 Mixed irritable bowel syndrome: Secondary | ICD-10-CM | POA: Diagnosis not present

## 2019-03-21 DIAGNOSIS — R110 Nausea: Secondary | ICD-10-CM

## 2019-03-21 DIAGNOSIS — R1013 Epigastric pain: Secondary | ICD-10-CM | POA: Diagnosis not present

## 2019-03-21 DIAGNOSIS — G8929 Other chronic pain: Secondary | ICD-10-CM | POA: Diagnosis not present

## 2019-03-21 MED ORDER — ONDANSETRON HCL 8 MG PO TABS: 8.0000 mg | ORAL_TABLET | Freq: Three times a day (TID) | ORAL | 0 refills | Status: DC | PRN

## 2019-03-21 NOTE — Progress Notes (Signed)
[facility] Progress Note  Chief Complaint: Nausea  Subjective  History: Previously saw Deatra Ina, then saw me in 2017 and April 2018 for IBS with constipation as well as heartburn, epigastric and chest fullness after meals, throat clearing and concerns for reflux.  She had had little improvement on multiple PPIs over many years.  In April 2018 she had pH/impedance and esophageal manometry off acid suppression, and it demonstrated normal manometry and no identifiable reflux. My recommendation was to follow-up in clinic afterwards, but she has not been seen since then.  Normal gastric emptying studies in 2012 and 2008 Last EGD 10/2016   Rhonda [name] is feeling very similar to when I last saw her.  She has chronic nausea with intermittent feelings of regurgitation and heartburn.  At times she will have "dry heaves".  There is no dysphagia, she does not actually vomit or bring up food.  Her appetite remains fair her weight stable.  She has epigastric discomfort as well as crampy lower abdominal discomfort worse after bowel movements.  She has alternating constipation and diarrhea, typically going no more than 2 days without a BM, then after a bowel movement she will feel fatigued from crampy lower belly pain.  Diarrhea typically means several semi-formed stools in a day.  She denies rectal bleeding.  Her new primary care provider had been prescribing Phenergan which works better some days than others.  ROS: Cardiovascular:  no chest pain Respiratory: no dyspnea Chronic anxiety Mood swings and hot flashes from perimenopause  The patient's Past Medical, Family and Social History were reviewed and are on file in the EMR.  Objective:  Med list reviewed  Current Outpatient Medications:  .  albuterol (PROVENTIL HFA;VENTOLIN HFA) 108 (90 Base) MCG/ACT inhaler, INHALE 2 PUFFS INTO THE LUNGS EVERY 6 HOURS AS NEEDED, Disp: 6.7 g, Rfl: 1 .  ALPRAZolam (XANAX) 0.25 MG tablet, Take 1 tablet (0.25  mg total) by mouth 2 (two) times daily as needed for anxiety., Disp: 60 tablet, Rfl: 0 .  cyclobenzaprine (FLEXERIL) 5 MG tablet, Take 1 tablet (5 mg total) by mouth 2 (two) times daily as needed for muscle spasms (and back pain)., Disp: 30 tablet, Rfl: 0 .  ipratropium (ATROVENT) 0.06 % nasal spray, Place 2 sprays into both nostrils 4 (four) times daily., Disp: 15 mL, Rfl: 0 .  levothyroxine (SYNTHROID) 112 MCG tablet, TAKE 1 TABLET(112 MCG) BY MOUTH before Breakfast daily, Disp: 90 tablet, Rfl: 1 .  metoprolol succinate (TOPROL-XL) 50 MG 24 hr tablet, TAKE 1 TABLET BY MOUTH EVERY DAY IMMEDIATELY FOLLOWING A MEAL, Disp: 90 tablet, Rfl: 1 .  olmesartan-hydrochlorothiazide (BENICAR HCT) 40-25 MG tablet, Take 1 tablet by mouth daily., Disp: 90 tablet, Rfl: 3 .  omeprazole (PRILOSEC) 40 MG capsule, Take 1 capsule (40 mg total) by mouth daily., Disp: 90 capsule, Rfl: 1 .  predniSONE (STERAPRED UNI-PAK 21 TAB) 10 MG (21) TBPK tablet, Take as directed, Disp: 21 tablet, Rfl: 0 .  promethazine (PHENERGAN) 25 MG tablet, 1 tab every 8 hours as needed for nausea due to headache, Disp: 20 tablet, Rfl: 0 .  ondansetron (ZOFRAN) 8 MG tablet, Take 1 tablet (8 mg total) by mouth every 8 (eight) hours as needed for nausea or vomiting., Disp: 30 tablet, Rfl: 0   Vital signs in last 24 hrs: Vitals:   03/21/19 1512  BP: 92/64  Pulse: 72  Temp: 98.6 F (37 C)    Physical Exam  She is well-appearing  HEENT: sclera anicteric,  oral mucosa moist without lesions  Neck: supple, no thyromegaly, JVD or lymphadenopathy  Cardiac: RRR without murmurs, S1S2 heard, no peripheral edema  Pulm: clear to auscultation bilaterally, normal RR and effort noted  Abdomen: soft, no tenderness, with active bowel sounds. No guarding or palpable hepatosplenomegaly.  Skin; warm and dry, no jaundice or rash  @ASSESSMENTPLANBEGIN @ Assessment: Encounter Diagnoses  Name Primary?  . Nausea in adult Yes  . Abdominal pain,  chronic, epigastric   . Irritable bowel syndrome with both constipation and diarrhea    Longstanding functional bowel symptoms that have recently flared.  Previous extensive unrevealing work-up.  Lately she is having more frequent and prominent nausea.  Alternating bowel habits are similar to before.  Plan: Trial of Zofran 8 mg up to 3 times daily as needed, cautioning that it could worsen constipation.  She reported trying it in the hospital years ago and was not sure if it was effective, unknown dose.  Samples of IBgard, 2 capsules twice daily.  If not much improvement, consider trial of buspirone.   Total time 25 minutes, over half spent face-to-face with patient in counseling and coordination of care.   Nelida Meuse III

## 2019-03-21 NOTE — Patient Instructions (Signed)
If you are age 50 or older, your body mass index should be between 23-30. Your Body mass index is 37.04 kg/m. If this is out of the aforementioned range listed, please consider follow up with your Primary Care Provider.  If you are age 31 or younger, your body mass index should be between 19-25. Your Body mass index is 37.04 kg/m. If this is out of the aformentioned range listed, please consider follow up with your Primary Care Provider.   Samples of this drug were given to the patient, quantity 6 boxes, Lot Number [id number]  It was a pleasure to see you today!  Dr. Loletha Carrow

## 2019-03-29 ENCOUNTER — Telehealth: Payer: Self-pay | Admitting: Gastroenterology

## 2019-03-29 NOTE — Telephone Encounter (Signed)
Spoke to the patient who reported she was prescribed Zofran on 8/4, she reports she has only taken it twice, once on Friday and once yesterday on 8/11. The patient made it absolutely clear that she was unwilling to take more stating the medication "didn't touch my nausea and gave me a headache." Please advise.

## 2019-03-30 ENCOUNTER — Other Ambulatory Visit: Payer: Self-pay

## 2019-03-30 ENCOUNTER — Ambulatory Visit
Admission: RE | Admit: 2019-03-30 | Discharge: 2019-03-30 | Disposition: A | Discharge status: Home / Self Care | Payer: No Typology Code available for payment source | Source: Ambulatory Visit | Attending: Internal Medicine | Admitting: Internal Medicine

## 2019-03-30 DIAGNOSIS — Z1231 Encounter for screening mammogram for malignant neoplasm of breast: Secondary | ICD-10-CM

## 2019-04-03 ENCOUNTER — Other Ambulatory Visit: Payer: Self-pay

## 2019-04-03 ENCOUNTER — Other Ambulatory Visit (INDEPENDENT_AMBULATORY_CARE_PROVIDER_SITE_OTHER): Payer: No Typology Code available for payment source

## 2019-04-03 DIAGNOSIS — E890 Postprocedural hypothyroidism: Secondary | ICD-10-CM | POA: Diagnosis not present

## 2019-04-03 NOTE — Telephone Encounter (Signed)
Spoke to the patient, she is will to try this medication. Please advise on dose.

## 2019-04-03 NOTE — Telephone Encounter (Signed)
Sorry to hear that did not work out.  I have one last idea for a medicine to try if she would consider it.  Buspirone - primarily an anxiety medicine also used for dyspepsia and IBS like she has.   Can also discuss it further with PCP since it looks like she is due to see them later this week.

## 2019-04-04 LAB — T4, FREE: Free T4: 1.05 ng/dL (ref 0.60–1.60)

## 2019-04-04 LAB — TSH: TSH: 0.36 u[IU]/mL (ref 0.35–4.50)

## 2019-04-05 MED ORDER — BUSPIRONE HCL 15 MG PO TABS: 15.0000 mg | ORAL_TABLET | Freq: Every day | ORAL | 0 refills | Status: DC

## 2019-04-05 NOTE — Telephone Encounter (Signed)
Spoke to the patient, gave medication instructions. Verbalized understanding. No additional questions or concerns voiced at the time of the call.

## 2019-04-05 NOTE — Telephone Encounter (Signed)
I sent the prescription.  Buspirone 15 mg tablet.  Start with one half tablet at bedtime for a week, then increase to one whole tablet at bedtime if no side effects.

## 2019-04-06 ENCOUNTER — Encounter: Payer: Self-pay | Admitting: Endocrinology

## 2019-04-06 ENCOUNTER — Ambulatory Visit (INDEPENDENT_AMBULATORY_CARE_PROVIDER_SITE_OTHER): Payer: No Typology Code available for payment source | Admitting: Endocrinology

## 2019-04-06 ENCOUNTER — Other Ambulatory Visit: Payer: Self-pay

## 2019-04-06 DIAGNOSIS — E890 Postprocedural hypothyroidism: Secondary | ICD-10-CM

## 2019-04-06 MED ORDER — LEVOTHYROXINE SODIUM 100 MCG PO TABS: 100.0000 ug | ORAL_TABLET | Freq: Every day | ORAL | 3 refills | Status: DC

## 2019-04-06 NOTE — Progress Notes (Signed)
Patient ID: Rhonda [name], female   DOB: June 26, 1971, 48 y.o.   MRN: [id number]             Reason for Appointment:  Hypothyroidism, follow-up visit  Today's office visit was provided via telemedicine using video technique The patient was explained the limitations of evaluation and management by telemedicine and the availability of in person appointments.  The patient understood the limitations and agreed to proceed. Patient also understood that the telehealth visit is billable. . Location of the patient: Patient's home . Location of the provider: Physician office Only the patient and myself were participating in the encounter    History of Present Illness:   Hypothyroidism was first diagnosed in 1991  Hypothyroidism apparently was secondary to surgery on her goiter and no details are available She was told that after the surgery there was a focus of cancer found in her thyroid but again no details available She subsequently had radioactive iodine treatment         The patient has been treated with variable doses of levothyroxine  To her initial consultation she had been on a stable dose of 150 mcg levothyroxine and apparently was feeling fairly good with this    However subsequently since 05/2018 despite no new symptoms at that time her TSH was 0.01 No prior levels of TSH are available on her record since 2016  RECENT HISTORY:  Her levothyroxine dose had been adjusted because of persistently low TSH in 2019 and early 2020 She is now taking 112 mcg daily as before She was continued on this dose on her initial consultation in 09/2018  Her only symptoms now are periodic hot flashes and some difficulty sleeping With this she may feel more tired lately No shakiness or palpitations  The patient takes the thyroid supplement before breakfast and is taking it regularly with water on a daily basis She also takes her other medications and Prilosec at the same time         Her  TSH which was improved at 0.6 is now again low normal at 0.36 Free T4 is relatively higher, previously 0.89  She does not appear to have any weight gain or loss  Patient's weight history is as follows:  Wt Readings from Last 3 Encounters:  03/21/19 226 lb (102.5 kg)  11/04/18 226 lb 3.2 oz (102.6 kg)  10/04/18 227 lb 11.2 oz (103.3 kg)    Thyroid function results have been as follows:  Lab Results  Component Value Date   TSH 0.36 04/03/2019   TSH 0.57 11/29/2018   TSH 0.14 (L) 09/27/2018   TSH 0.04 (L) 09/02/2018   FREET4 1.05 04/03/2019   FREET4 0.89 11/29/2018   FREET4 0.97 09/27/2018   FREET4 1.22 09/02/2018   T3FREE 3.7 09/27/2018   T3FREE 3.2 09/02/2018   HYPERCALCEMIA:  She had a high calcium in October 2019 However review of her medications indicates that she was previously not on HCTZ and has been on 25 mg HCTZ since middle of 2019  PTH level done from [facility] lab was high but was normal from [facility] in 2/20  Subsequently calcium levels have been in the upper normal range consistently  Lab Results  Component Value Date   CALCIUM 10.4 11/29/2018   CALCIUM 10.3 09/27/2018   CALCIUM 10.0 08/05/2018   CALCIUM 10.6 (H) 05/31/2018   CALCIUM 9.7 03/27/2017   CALCIUM 9.9 07/11/2016   CALCIUM 9.9 05/27/2016   Lab Results  Component Value Date   PTH  32 09/27/2018   CALCIUM 10.4 11/29/2018   CAION 5.40 08/05/2018     Past Medical History:  Diagnosis Date  . Anxiety   . C1q nephropathy   . Cancer [facility])    history of thyroid cancer  . Chronic constipation   . CKD (chronic kidney disease), stage II nephrologist-  dr Gretchen Short [facility])   secondary to C1q nephropathy/  hypertension  . Family history of adverse reaction to anesthesia    father hard to put down for anesthesia  . GERD (gastroesophageal reflux disease)   . H/O radioactive iodine thyroid ablation    1992  right throid  . History of exercise intolerance    [id number]   ETT--  normal w/ no ischemia per cardiologist (dr Aundra Dubin) epic note  . History of pulmonary embolus (PE)    07/ 2008-- treated w/ coumadin  . History of syncope    recurrent --  workup done per epic -- dx orthostatic hypotension  . Hypertension   . IBS (irritable bowel syndrome)   . Pelvic pain in female   . 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 Proteinuria   . Seasonal and perennial allergic rhinitis   . Wears contact lenses     Past Surgical History:  Procedure Laterality Date  . [place] STUDY N/A 11/16/2016   Procedure: [place] STUDY;  Surgeon: Doran Stabler, MD;  Location: [facility];  Service: Gastroenterology;  Laterality: N/A;  . ABDOMINAL HYSTERECTOMY  1998  . CYSTOSCOPY N/A 04/28/2016   Procedure: CYSTOSCOPY;  Surgeon: Arvella Nigh, MD;  Location: [facility];  Service: Gynecology;  Laterality: N/A;  . ESOPHAGEAL MANOMETRY N/A 11/16/2016   Procedure: ESOPHAGEAL MANOMETRY (EM);  Surgeon: Doran Stabler, MD;  Location: [facility];  Service: Gastroenterology;  Laterality: N/A;  . ESOPHAGOGASTRODUODENOSCOPY    . LAPAROSCOPIC CHOLECYSTECTOMY  1997  . LAPAROSCOPY N/A 03/26/2016   Procedure: LAPAROSCOPY DIAGNOSTIC, LYSIS OF ADHESIONS;  Surgeon: Arvella Nigh, MD;  Location: [place];  Service: Gynecology;  Laterality: N/A;  . LAPAROTOMY N/A 04/28/2016   Procedure: EXPLORATORY LAPAROTOMY;  Surgeon: Arvella Nigh, MD;  Location: [facility];  Service: Gynecology;  Laterality: N/A;  . RENAL BIOPSY    . SALPINGOOPHORECTOMY Bilateral 04/28/2016   Procedure: Bilateral OOPHORECTOMY;  Surgeon: Arvella Nigh, MD;  Location: [facility];  Service: Gynecology;  Laterality: Bilateral;  . THYROIDECTOMY Left 1991  . TRANSTHORACIC ECHOCARDIOGRAM  04/27/2012   normal LV, ef 60-65%/  trivial MR  . TUBAL LIGATION  1992    Family History  Problem Relation Age of Onset  . Hypertension Father   . Coronary artery disease Father   . Diabetes Father   . Stroke Father    . Prostate cancer Father   . Diabetes Mother        Pre-diabetic  . Hypertension Mother   . Breast cancer Maternal Aunt   . Thyroid disease Maternal Aunt   . Colon cancer Neg Hx   . Colon polyps Neg Hx   . Kidney disease Neg Hx   . Esophageal cancer Neg Hx   . Gallbladder disease Neg Hx     Social History:  reports that she has never smoked. She has never used smokeless tobacco. She reports that she does not drink alcohol or use drugs.  Allergies:   Allergies  Allergen Reactions  . Aspirin Hives  . Flagyl [Metronidazole] Nausea And Vomiting    With oral flagyl.  Has tolerated vaginal flagyl.    . Food Other (See Comments)    Lima beans--  Positive allergy test   . Tramadol Other (See Comments)    Headache   . Dilaudid [Hydromorphone] Itching    Allergies as of 04/06/2019      Reactions   Aspirin Hives   Flagyl [metronidazole] Nausea And Vomiting   With oral flagyl.   Has tolerated vaginal flagyl.     Food Other (See Comments)   Lima beans--  Positive allergy test    Tramadol Other (See Comments)   Headache    Dilaudid [hydromorphone] Itching      Medication List       Accurate as of April 06, 2019  9:10 PM. If you have any questions, ask your nurse or doctor.        albuterol 108 (90 Base) MCG/ACT inhaler Commonly known as: VENTOLIN HFA INHALE 2 PUFFS INTO THE LUNGS EVERY 6 HOURS AS NEEDED   ALPRAZolam 0.25 MG tablet Commonly known as: XANAX Take 1 tablet (0.25 mg total) by mouth 2 (two) times daily as needed for anxiety.   busPIRone 15 MG tablet Commonly known as: BUSPAR Take 1 tablet (15 mg total) by mouth at bedtime. Start with one half tablet nightly for one week, then increase to one whole tablet nightly   cyclobenzaprine 5 MG tablet Commonly known as: FLEXERIL Take 1 tablet (5 mg total) by mouth 2 (two) times daily as needed for muscle spasms (and back pain).   ipratropium 0.06 % nasal spray Commonly known as: Atrovent Place 2 sprays into both  nostrils 4 (four) times daily.   levothyroxine 112 MCG tablet Commonly known as: SYNTHROID TAKE 1 TABLET(112 MCG) BY MOUTH before Breakfast daily   metoprolol succinate 50 MG 24 hr tablet Commonly known as: TOPROL-XL TAKE 1 TABLET BY MOUTH EVERY DAY IMMEDIATELY FOLLOWING A MEAL   olmesartan-hydrochlorothiazide 40-25 MG tablet Commonly known as: BENICAR HCT Take 1 tablet by mouth daily.   omeprazole 40 MG capsule Commonly known as: PRILOSEC Take 1 capsule (40 mg total) by mouth daily.   ondansetron 8 MG tablet Commonly known as: Zofran Take 1 tablet (8 mg total) by mouth every 8 (eight) hours as needed for nausea or vomiting.   predniSONE 10 MG (21) Tbpk tablet Commonly known as: STERAPRED UNI-PAK 21 TAB Take as directed   promethazine 25 MG tablet Commonly known as: PHENERGAN 1 tab every 8 hours as needed for nausea due to headache          Review of Systems  Hypertension, followed by her PCP  She has had a hysterectomy and has menopausal symptoms but has refused HRT         Examination:    There were no vitals taken for this visit.    Assessment:  HYPOTHYROIDISM, postsurgical  She continues to have low normal TSH levels at 112 mcg dose of levothyroxine In the past had taken higher doses previously and this indicates some functional regrowth of her previous multinodular goiter No history of goiter on exam when first seen in February  Her fatigue is likely to be related from menopausal symptoms and difficulty with sleep  She is consistent with taking her medication as directed TSH is now 0.36   HYPERCALCEMIA: Calcium levels are high normal again This is likely related to HCTZ   PLAN:  Levothyroxine 100 mcg daily This can be started when she runs out of the current prescription Discussed taking this before breakfast  without any nutritional supplements   Follow-up in 3 months with repeat thyroid levels    Elayne Snare 04/06/2019, 9:10 PM      Note: This office note was prepared with Dragon voice recognition system technology. Any transcriptional errors that result from this process are unintentional.   Elayne Snare

## 2019-04-07 ENCOUNTER — Encounter: Payer: No Typology Code available for payment source | Admitting: Internal Medicine

## 2019-04-14 ENCOUNTER — Other Ambulatory Visit: Payer: Self-pay | Admitting: Internal Medicine

## 2019-05-10 ENCOUNTER — Telehealth: Payer: No Typology Code available for payment source | Admitting: Nurse Practitioner

## 2019-05-10 DIAGNOSIS — N3000 Acute cystitis without hematuria: Secondary | ICD-10-CM | POA: Diagnosis not present

## 2019-05-10 MED ORDER — CEPHALEXIN 500 MG PO CAPS: 500.0000 mg | ORAL_CAPSULE | Freq: Two times a day (BID) | ORAL | 0 refills | Status: DC

## 2019-05-10 NOTE — Progress Notes (Signed)

## 2019-05-19 ENCOUNTER — Other Ambulatory Visit: Payer: Self-pay | Admitting: Internal Medicine

## 2019-05-19 ENCOUNTER — Other Ambulatory Visit: Payer: Self-pay

## 2019-05-19 ENCOUNTER — Ambulatory Visit: Payer: No Typology Code available for payment source | Admitting: Internal Medicine

## 2019-05-19 ENCOUNTER — Encounter: Payer: Self-pay | Admitting: Internal Medicine

## 2019-05-19 VITALS — BP 110/80 | HR 60 | Temp 97.7°F | Ht 66.0 in | Wt 228.3 lb

## 2019-05-19 DIAGNOSIS — Z23 Encounter for immunization: Secondary | ICD-10-CM

## 2019-05-19 DIAGNOSIS — E559 Vitamin D deficiency, unspecified: Secondary | ICD-10-CM | POA: Insufficient documentation

## 2019-05-19 DIAGNOSIS — F411 Generalized anxiety disorder: Secondary | ICD-10-CM | POA: Diagnosis not present

## 2019-05-19 DIAGNOSIS — E785 Hyperlipidemia, unspecified: Secondary | ICD-10-CM

## 2019-05-19 DIAGNOSIS — N183 Chronic kidney disease, stage 3 unspecified: Secondary | ICD-10-CM

## 2019-05-19 DIAGNOSIS — E890 Postprocedural hypothyroidism: Secondary | ICD-10-CM | POA: Diagnosis not present

## 2019-05-19 DIAGNOSIS — I10 Essential (primary) hypertension: Secondary | ICD-10-CM

## 2019-05-19 DIAGNOSIS — R7302 Impaired glucose tolerance (oral): Secondary | ICD-10-CM | POA: Insufficient documentation

## 2019-05-19 DIAGNOSIS — K219 Gastro-esophageal reflux disease without esophagitis: Secondary | ICD-10-CM

## 2019-05-19 DIAGNOSIS — Z0000 Encounter for general adult medical examination without abnormal findings: Secondary | ICD-10-CM

## 2019-05-19 LAB — CBC WITH DIFFERENTIAL/PLATELET
Basophils Absolute: 0.1 10*3/uL (ref 0.0–0.1)
Basophils Relative: 0.8 % (ref 0.0–3.0)
Eosinophils Absolute: 0.3 10*3/uL (ref 0.0–0.7)
Eosinophils Relative: 5.2 % — ABNORMAL HIGH (ref 0.0–5.0)
HCT: 36.9 % (ref 36.0–46.0)
Hemoglobin: 12.2 g/dL (ref 12.0–15.0)
Lymphocytes Relative: 41.1 % (ref 12.0–46.0)
Lymphs Abs: 2.6 10*3/uL (ref 0.7–4.0)
MCHC: 32.9 g/dL (ref 30.0–36.0)
MCV: 84.9 fl (ref 78.0–100.0)
Monocytes Absolute: 0.4 10*3/uL (ref 0.1–1.0)
Monocytes Relative: 6.8 % (ref 3.0–12.0)
Neutro Abs: 3 10*3/uL (ref 1.4–7.7)
Neutrophils Relative %: 46.1 % (ref 43.0–77.0)
Platelets: 271 10*3/uL (ref 150.0–400.0)
RBC: 4.35 Mil/uL (ref 3.87–5.11)
RDW: 14.5 % (ref 11.5–15.5)
WBC: 6.4 10*3/uL (ref 4.0–10.5)

## 2019-05-19 LAB — HEMOGLOBIN A1C: Hgb A1c MFr Bld: 6.4 % (ref 4.6–6.5)

## 2019-05-19 LAB — COMPREHENSIVE METABOLIC PANEL
ALT: 14 U/L (ref 0–35)
AST: 15 U/L (ref 0–37)
Albumin: 4.3 g/dL (ref 3.5–5.2)
Alkaline Phosphatase: 102 U/L (ref 39–117)
BUN: 31 mg/dL — ABNORMAL HIGH (ref 6–23)
CO2: 29 mEq/L (ref 19–32)
Calcium: 10.7 mg/dL — ABNORMAL HIGH (ref 8.4–10.5)
Chloride: 103 mEq/L (ref 96–112)
Creatinine, Ser: 1.6 mg/dL — ABNORMAL HIGH (ref 0.40–1.20)
GFR: 41.24 mL/min — ABNORMAL LOW (ref >60.00)
Glucose, Bld: 90 mg/dL (ref 70–99)
Potassium: 4.6 mEq/L (ref 3.5–5.1)
Sodium: 139 mEq/L (ref 135–145)
Total Bilirubin: 0.3 mg/dL (ref 0.2–1.2)
Total Protein: 8.1 g/dL (ref 6.0–8.3)

## 2019-05-19 LAB — LIPID PANEL
Cholesterol: 236 mg/dL — ABNORMAL HIGH (ref 0–200)
HDL: 39.8 mg/dL (ref >39.00)
LDL Cholesterol: 168 mg/dL — ABNORMAL HIGH (ref 0–99)
NonHDL: 195.71
Total CHOL/HDL Ratio: 6
Triglycerides: 137 mg/dL (ref 0.0–149.0)
VLDL: 27.4 mg/dL (ref 0.0–40.0)

## 2019-05-19 LAB — TSH: TSH: 0.78 u[IU]/mL (ref 0.35–4.50)

## 2019-05-19 LAB — VITAMIN B12: Vitamin B-12: 495 pg/mL (ref 211–911)

## 2019-05-19 LAB — VITAMIN D 25 HYDROXY (VIT D DEFICIENCY, FRACTURES): VITD: 26.93 ng/mL — ABNORMAL LOW (ref 30.00–100.00)

## 2019-05-19 MED ORDER — VITAMIN D (ERGOCALCIFEROL) 1.25 MG (50000 UNIT) PO CAPS: 50000.0000 [IU] | ORAL_CAPSULE | ORAL | 0 refills | Status: AC

## 2019-05-19 MED ORDER — ATORVASTATIN CALCIUM 40 MG PO TABS: 40.0000 mg | ORAL_TABLET | Freq: Every day | ORAL | 1 refills | Status: DC

## 2019-05-19 MED ORDER — ONDANSETRON HCL 8 MG PO TABS: 8.0000 mg | ORAL_TABLET | Freq: Three times a day (TID) | ORAL | 0 refills | Status: DC | PRN

## 2019-05-19 NOTE — Patient Instructions (Signed)
-Nice seeing you today!!  -Lab work today; will notify you once results are available.  -Remember to see your dentist.  -Flu and first shingles vaccine today.  -Schedule follow up in 4 months.   Preventive Care 69-50 Years Old, Female Preventive care refers to visits with your health care provider and lifestyle choices that can promote health and wellness. This includes:  A yearly physical exam. This may also be called an annual well check.  Regular dental visits and eye exams.  Immunizations.  Screening for certain conditions.  Healthy lifestyle choices, such as eating a healthy diet, getting regular exercise, not using drugs or products that contain nicotine and tobacco, and limiting alcohol use. What can I expect for my preventive care visit? Physical exam Your health care provider will check your:  Height and weight. This may be used to calculate body mass index (BMI), which tells if you are at a healthy weight.  Heart rate and blood pressure.  Skin for abnormal spots. Counseling Your health care provider may ask you questions about your:  Alcohol, tobacco, and drug use.  Emotional well-being.  Home and relationship well-being.  Sexual activity.  Eating habits.  Work and work Statistician.  Method of birth control.  Menstrual cycle.  Pregnancy history. What immunizations do I need?  Influenza (flu) vaccine  This is recommended every year. Tetanus, diphtheria, and pertussis (Tdap) vaccine  You may need a Td booster every 10 years. Varicella (chickenpox) vaccine  You may need this if you have not been vaccinated. Zoster (shingles) vaccine  You may need this after age 50. Measles, mumps, and rubella (MMR) vaccine  You may need at least one dose of MMR if you were born in 1957 or later. You may also need a second dose. Pneumococcal conjugate (PCV13) vaccine  You may need this if you have certain conditions and were not previously vaccinated.  Pneumococcal polysaccharide (PPSV23) vaccine  You may need one or two doses if you smoke cigarettes or if you have certain conditions. Meningococcal conjugate (MenACWY) vaccine  You may need this if you have certain conditions. Hepatitis A vaccine  You may need this if you have certain conditions or if you travel or work in places where you may be exposed to hepatitis A. Hepatitis B vaccine  You may need this if you have certain conditions or if you travel or work in places where you may be exposed to hepatitis B. Haemophilus influenzae type b (Hib) vaccine  You may need this if you have certain conditions. Human papillomavirus (HPV) vaccine  If recommended by your health care provider, you may need three doses over 6 months. You may receive vaccines as individual doses or as more than one vaccine together in one shot (combination vaccines). Talk with your health care provider about the risks and benefits of combination vaccines. What tests do I need? Blood tests  Lipid and cholesterol levels. These may be checked every 5 years, or more frequently if you are over 71 years old.  Hepatitis C test.  Hepatitis B test. Screening  Lung cancer screening. You may have this screening every year starting at age 20 if you have a 30-pack-year history of smoking and currently smoke or have quit within the past 15 years.  Colorectal cancer screening. All adults should have this screening starting at age 50 and continuing until age 38. Your health care provider may recommend screening at age 50 if you are at increased risk. You will have tests every  1-10 years, depending on your results and the type of screening test.  Diabetes screening. This is done by checking your blood sugar (glucose) after you have not eaten for a while (fasting). You may have this done every 1-3 years.  Mammogram. This may be done every 1-2 years. Talk with your health care provider about when you should start having  regular mammograms. This may depend on whether you have a family history of breast cancer.  BRCA-related cancer screening. This may be done if you have a family history of breast, ovarian, tubal, or peritoneal cancers.  Pelvic exam and Pap test. This may be done every 3 years starting at age 50. Starting at age 50, this may be done every 5 years if you have a Pap test in combination with an HPV test. Other tests  Sexually transmitted disease (STD) testing.  Bone density scan. This is done to screen for osteoporosis. You may have this scan if you are at high risk for osteoporosis. Follow these instructions at home: Eating and drinking  Eat a diet that includes fresh fruits and vegetables, whole grains, lean protein, and low-fat dairy.  Take vitamin and mineral supplements as recommended by your health care provider.  Do not drink alcohol if: ? Your health care provider tells you not to drink. ? You are pregnant, may be pregnant, or are planning to become pregnant.  If you drink alcohol: ? Limit how much you have to 0-1 drink a day. ? Be aware of how much alcohol is in your drink. In the [place] one drink equals one 12 oz bottle of beer (355 mL), one 5 oz glass of wine (148 mL), or one 1 oz glass of hard liquor (44 mL). Lifestyle  Take daily care of your teeth and gums.  Stay active. Exercise for at least 30 minutes on 5 or more days each week.  Do not use any products that contain nicotine or tobacco, such as cigarettes, e-cigarettes, and chewing tobacco. If you need help quitting, ask your health care provider.  If you are sexually active, practice safe sex. Use a condom or other form of birth control (contraception) in order to prevent pregnancy and STIs (sexually transmitted infections).  If told by your health care provider, take low-dose aspirin daily starting at age 52. What's next?  Visit your health care provider once a year for a well check visit.  Ask your health care  provider how often you should have your eyes and teeth checked.  Stay up to date on all vaccines. This information is not intended to replace advice given to you by your health care provider. Make sure you discuss any questions you have with your health care provider. Document Released: 08/30/2015 Document Revised: 04/14/2018 Document Reviewed: 04/14/2018 Elsevier Patient Education  2020 [employer].

## 2019-05-19 NOTE — Progress Notes (Signed)
Established Patient Office Visit     CC/Reason for Visit: Annual preventive exam  HPI: Rhonda [name] is a 48 y.o. female who is coming in today for the above mentioned reasons. Past Medical History is significant for: Hypothyroidism followed by endocrinology, not well controlled for which they are decreasing synthroid dosing due to persistently depressed TSH, HTN:  she remains on metoprolol at bedtime and olmesartan/HCTZ in th am, Norvasc was discontinued earlier this year by nephrology due to persistent hypotension, H/o GERD who is very symptomatic when she does not take her PPI. She had DVT/PE in 2008 and completed 6 months of anticoagulation therapy. She also has a h/o CKD Stage III and follows with Dr. Moshe Cipro. She has no acute complaints today.  She has routine eye care, needs routine dental care.  She is requesting flu and shingles vaccine today.  She tells me that she needs a TB test for her job.  States that GI prescribed buspirone for increased anxiety that they believe is playing into her gastrointestinal issues.  She picked up the medication but has not started taking it yet as she wanted my opinion.   Past Medical/Surgical History: Past Medical History:  Diagnosis Date  . Anxiety   . C1q nephropathy   . Cancer [facility])    history of thyroid cancer  . Chronic constipation   . CKD (chronic kidney disease), stage II nephrologist-  dr Gretchen Short [facility])   secondary to C1q nephropathy/  hypertension  . Family history of adverse reaction to anesthesia    father hard to put down for anesthesia  . GERD (gastroesophageal reflux disease)   . H/O radioactive iodine thyroid ablation    1992  right throid  . History of exercise intolerance    [id number]  ETT--  normal w/ no ischemia per cardiologist (dr Aundra Dubin) epic note  . History of pulmonary embolus (PE)    07/ 2008-- treated w/ coumadin  . History of syncope    recurrent --  workup  done per epic -- dx orthostatic hypotension  . Hypertension   . IBS (irritable bowel syndrome)   . Pelvic pain in female   . 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 Proteinuria   . Seasonal and perennial allergic rhinitis   . Wears contact lenses     Past Surgical History:  Procedure Laterality Date  . [place] STUDY N/A 11/16/2016   Procedure: [place] STUDY;  Surgeon: Doran Stabler, MD;  Location: [facility];  Service: Gastroenterology;  Laterality: N/A;  . ABDOMINAL HYSTERECTOMY  1998  . CYSTOSCOPY N/A 04/28/2016   Procedure: CYSTOSCOPY;  Surgeon: Arvella Nigh, MD;  Location: [place] [facility];  Service: Gynecology;  Laterality: N/A;  . ESOPHAGEAL MANOMETRY N/A 11/16/2016   Procedure: ESOPHAGEAL MANOMETRY (EM);  Surgeon: Doran Stabler, MD;  Location: [facility];  Service: Gastroenterology;  Laterality: N/A;  . ESOPHAGOGASTRODUODENOSCOPY    . LAPAROSCOPIC CHOLECYSTECTOMY  1997  . LAPAROSCOPY N/A 03/26/2016   Procedure: LAPAROSCOPY DIAGNOSTIC, LYSIS OF ADHESIONS;  Surgeon: Arvella Nigh, MD;  Location: [place];  Service: Gynecology;  Laterality: N/A;  . LAPAROTOMY N/A 04/28/2016   Procedure: EXPLORATORY LAPAROTOMY;  Surgeon: Arvella Nigh, MD;  Location: [place] [facility];  Service: Gynecology;  Laterality: N/A;  . RENAL BIOPSY    . SALPINGOOPHORECTOMY Bilateral 04/28/2016   Procedure: Bilateral OOPHORECTOMY;  Surgeon: Arvella Nigh, MD;  Location: [place] [facility];  Service: Gynecology;  Laterality: Bilateral;  .  THYROIDECTOMY Left 1991  . TRANSTHORACIC ECHOCARDIOGRAM  04/27/2012   normal LV, ef 60-65%/  trivial MR  . TUBAL LIGATION  1992    Social History:  reports that she has never smoked. She has never used smokeless tobacco. She reports that she does not drink alcohol or use drugs.  Allergies: Allergies  Allergen Reactions  . Aspirin Hives  . Flagyl [Metronidazole] Nausea And Vomiting    With oral flagyl.   Has tolerated vaginal flagyl.    . Food Other (See  Comments)    Lima beans--  Positive allergy test   . Tramadol Other (See Comments)    Headache   . Dilaudid [Hydromorphone] Itching    Family History:  Family History  Problem Relation Age of Onset  . Hypertension Father   . Coronary artery disease Father   . Diabetes Father   . Stroke Father   . Prostate cancer Father   . Diabetes Mother        Pre-diabetic  . Hypertension Mother   . Breast cancer Maternal Aunt   . Thyroid disease Maternal Aunt   . Colon cancer Neg Hx   . Colon polyps Neg Hx   . Kidney disease Neg Hx   . Esophageal cancer Neg Hx   . Gallbladder disease Neg Hx      Current Outpatient Medications:  .  albuterol (PROVENTIL HFA;VENTOLIN HFA) 108 (90 Base) MCG/ACT inhaler, INHALE 2 PUFFS INTO THE LUNGS EVERY 6 HOURS AS NEEDED, Disp: 6.7 g, Rfl: 1 .  ALPRAZolam (XANAX) 0.25 MG tablet, Take 1 tablet (0.25 mg total) by mouth 2 (two) times daily as needed for anxiety., Disp: 60 tablet, Rfl: 0 .  busPIRone (BUSPAR) 15 MG tablet, Take 1 tablet (15 mg total) by mouth at bedtime. Start with one half tablet nightly for one week, then increase to one whole tablet nightly, Disp: 30 tablet, Rfl: 0 .  cephALEXin (KEFLEX) 500 MG capsule, Take 1 capsule (500 mg total) by mouth 2 (two) times daily., Disp: 14 capsule, Rfl: 0 .  cyclobenzaprine (FLEXERIL) 5 MG tablet, Take 1 tablet (5 mg total) by mouth 2 (two) times daily as needed for muscle spasms (and back pain)., Disp: 30 tablet, Rfl: 0 .  ipratropium (ATROVENT) 0.06 % nasal spray, Place 2 sprays into both nostrils 4 (four) times daily., Disp: 15 mL, Rfl: 0 .  levothyroxine (SYNTHROID) 100 MCG tablet, Take 1 tablet (100 mcg total) by mouth daily., Disp: 30 tablet, Rfl: 3 .  metoprolol succinate (TOPROL-XL) 50 MG 24 hr tablet, TAKE 1 TABLET BY MOUTH EVERY DAY IMMEDIATELY FOLLOWING A MEAL, Disp: 90 tablet, Rfl: 1 .  olmesartan-hydrochlorothiazide (BENICAR HCT) 40-25 MG tablet, Take 1 tablet by mouth daily., Disp: 90 tablet, Rfl: 3  .  omeprazole (PRILOSEC) 40 MG capsule, Take 1 capsule (40 mg total) by mouth daily., Disp: 90 capsule, Rfl: 1 .  ondansetron (ZOFRAN) 8 MG tablet, Take 1 tablet (8 mg total) by mouth every 8 (eight) hours as needed for nausea or vomiting., Disp: 30 tablet, Rfl: 0 .  predniSONE (STERAPRED UNI-PAK 21 TAB) 10 MG (21) TBPK tablet, Take as directed, Disp: 21 tablet, Rfl: 0 .  promethazine (PHENERGAN) 25 MG tablet, 1 tab every 8 hours as needed for nausea due to headache, Disp: 20 tablet, Rfl: 0  Review of Systems:  Constitutional: Denies fever, chills, diaphoresis, appetite change and fatigue.  HEENT: Denies photophobia, eye pain, redness, hearing loss, ear pain, congestion, sore throat, rhinorrhea, sneezing, mouth sores, trouble  swallowing, neck pain, neck stiffness and tinnitus.   Respiratory: Denies SOB, DOE, cough, chest tightness,  and wheezing.   Cardiovascular: Denies chest pain, palpitations and leg swelling.  Gastrointestinal: Denies nausea, vomiting, abdominal pain, diarrhea, constipation, blood in stool and abdominal distention.  Genitourinary: Denies dysuria, urgency, frequency, hematuria, flank pain and difficulty urinating.  Endocrine: Denies: hot or cold intolerance, sweats, changes in hair or nails, polyuria, polydipsia. Musculoskeletal: Denies myalgias, back pain, joint swelling, arthralgias and gait problem.  Skin: Denies pallor, rash and wound.  Neurological: Denies dizziness, seizures, syncope, weakness, light-headedness, numbness and headaches.  Hematological: Denies adenopathy. Easy bruising, personal or family bleeding history  Psychiatric/Behavioral: Denies suicidal ideation, mood changes, confusion, nervousness, sleep disturbance and agitation    Physical Exam: Vitals:   05/19/19 0742  BP: 110/80  Pulse: 60  Temp: 97.7 F (36.5 C)  TempSrc: Temporal  SpO2: 97%  Weight: 228 lb 4.8 oz (103.6 kg)  Height: '5\' 6"'  (1.676 m)    Body mass index is 36.85 kg/m.    Constitutional: NAD, calm, comfortable Eyes: PERRL, lids and conjunctivae normal, wears corrective lenses ENMT: Mucous membranes are moist.  Tympanic membrane is pearly white, no erythema or bulging. Neck: normal, supple, no masses, no thyromegaly Respiratory: clear to auscultation bilaterally, no wheezing, no crackles. Normal respiratory effort. No accessory muscle use.  Cardiovascular: Regular rate and rhythm, no murmurs / rubs / gallops. No extremity edema. 2+ pedal pulses. No carotid bruits.  Abdomen: no tenderness, no masses palpated. No hepatosplenomegaly. Bowel sounds positive.  Musculoskeletal: no clubbing / cyanosis. No joint deformity upper and lower extremities. Good ROM, no contractures. Normal muscle tone.  Skin: no rashes, lesions, ulcers. No induration Neurologic: CN 2-12 grossly intact. Sensation intact, DTR normal. Strength 5/5 in all 4.  Psychiatric: Normal judgment and insight. Alert and oriented x 3. Normal mood.    Impression and Plan:  Encounter for preventive health examination  -Has routine eye care, advised routine dental care. -To receive flu and first shingles vaccine today, otherwise immunizations are up-to-date. -Screening labs to be performed today. -Healthy lifestyle has been discussed in detail. -She had a mammogram in August that was normal. -She sees GYN, Dr. Radene Knee for cervical cancer screening and annual pelvic exams. -She states she had a colonoscopy in the past and is due for one in 2021. -Request QuantiFERON gold as requested by patient for her job.  Anxiety state -Okay to start buspirone prescribed by GI.  Essential hypertension  -Well-controlled on current medications.  Dyslipidemia  -No LDL on file, not on statin.  Postoperative hypothyroidism -Followed by endocrinology, Dr. Dwyane Dee. -Her levothyroxine dose was recently decreased to 100 mcg due to persistently depressed TSH.  Stage 3 chronic kidney disease, unspecified whether stage 3a or  3b CKD -Followed by nephrology, creatinine has been stable around 1.55.  Gastroesophageal reflux disease without esophagitis -Still very symptomatic when not on PPI therapy. -Follows with GI routinely.  Morbid obesity ([place]) -Body mass index is 36.85 kg/m. -Discussed healthy lifestyle, including increased physical activity and better food choices to promote weight loss.     Patient Instructions  -Nice seeing you today!!  -Lab work today; will notify you once results are available.  -Remember to see your dentist.  -Flu and first shingles vaccine today.  -Schedule follow up in 4 months.   Preventive Care 84-82 Years Old, Female Preventive care refers to visits with your health care provider and lifestyle choices that can promote health and wellness. This includes:  A  yearly physical exam. This may also be called an annual well check.  Regular dental visits and eye exams.  Immunizations.  Screening for certain conditions.  Healthy lifestyle choices, such as eating a healthy diet, getting regular exercise, not using drugs or products that contain nicotine and tobacco, and limiting alcohol use. What can I expect for my preventive care visit? Physical exam Your health care provider will check your:  Height and weight. This may be used to calculate body mass index (BMI), which tells if you are at a healthy weight.  Heart rate and blood pressure.  Skin for abnormal spots. Counseling Your health care provider may ask you questions about your:  Alcohol, tobacco, and drug use.  Emotional well-being.  Home and relationship well-being.  Sexual activity.  Eating habits.  Work and work Statistician.  Method of birth control.  Menstrual cycle.  Pregnancy history. What immunizations do I need?  Influenza (flu) vaccine  This is recommended every year. Tetanus, diphtheria, and pertussis (Tdap) vaccine  You may need a Td booster every 10 years. Varicella  (chickenpox) vaccine  You may need this if you have not been vaccinated. Zoster (shingles) vaccine  You may need this after age 63. Measles, mumps, and rubella (MMR) vaccine  You may need at least one dose of MMR if you were born in 1957 or later. You may also need a second dose. Pneumococcal conjugate (PCV13) vaccine  You may need this if you have certain conditions and were not previously vaccinated. Pneumococcal polysaccharide (PPSV23) vaccine  You may need one or two doses if you smoke cigarettes or if you have certain conditions. Meningococcal conjugate (MenACWY) vaccine  You may need this if you have certain conditions. Hepatitis A vaccine  You may need this if you have certain conditions or if you travel or work in places where you may be exposed to hepatitis A. Hepatitis B vaccine  You may need this if you have certain conditions or if you travel or work in places where you may be exposed to hepatitis B. Haemophilus influenzae type b (Hib) vaccine  You may need this if you have certain conditions. Human papillomavirus (HPV) vaccine  If recommended by your health care provider, you may need three doses over 6 months. You may receive vaccines as individual doses or as more than one vaccine together in one shot (combination vaccines). Talk with your health care provider about the risks and benefits of combination vaccines. What tests do I need? Blood tests  Lipid and cholesterol levels. These may be checked every 5 years, or more frequently if you are over 54 years old.  Hepatitis C test.  Hepatitis B test. Screening  Lung cancer screening. You may have this screening every year starting at age 21 if you have a 30-pack-year history of smoking and currently smoke or have quit within the past 15 years.  Colorectal cancer screening. All adults should have this screening starting at age 22 and continuing until age 62. Your health care provider may recommend screening at  age 8 if you are at increased risk. You will have tests every 1-10 years, depending on your results and the type of screening test.  Diabetes screening. This is done by checking your blood sugar (glucose) after you have not eaten for a while (fasting). You may have this done every 1-3 years.  Mammogram. This may be done every 1-2 years. Talk with your health care provider about when you should start having regular mammograms. This  may depend on whether you have a family history of breast cancer.  BRCA-related cancer screening. This may be done if you have a family history of breast, ovarian, tubal, or peritoneal cancers.  Pelvic exam and Pap test. This may be done every 3 years starting at age 68. Starting at age 84, this may be done every 5 years if you have a Pap test in combination with an HPV test. Other tests  Sexually transmitted disease (STD) testing.  Bone density scan. This is done to screen for osteoporosis. You may have this scan if you are at high risk for osteoporosis. Follow these instructions at home: Eating and drinking  Eat a diet that includes fresh fruits and vegetables, whole grains, lean protein, and low-fat dairy.  Take vitamin and mineral supplements as recommended by your health care provider.  Do not drink alcohol if: ? Your health care provider tells you not to drink. ? You are pregnant, may be pregnant, or are planning to become pregnant.  If you drink alcohol: ? Limit how much you have to 0-1 drink a day. ? Be aware of how much alcohol is in your drink. In the [place] one drink equals one 12 oz bottle of beer (355 mL), one 5 oz glass of wine (148 mL), or one 1 oz glass of hard liquor (44 mL). Lifestyle  Take daily care of your teeth and gums.  Stay active. Exercise for at least 30 minutes on 5 or more days each week.  Do not use any products that contain nicotine or tobacco, such as cigarettes, e-cigarettes, and chewing tobacco. If you need help quitting,  ask your health care provider.  If you are sexually active, practice safe sex. Use a condom or other form of birth control (contraception) in order to prevent pregnancy and STIs (sexually transmitted infections).  If told by your health care provider, take low-dose aspirin daily starting at age 38. What's next?  Visit your health care provider once a year for a well check visit.  Ask your health care provider how often you should have your eyes and teeth checked.  Stay up to date on all vaccines. This information is not intended to replace advice given to you by your health care provider. Make sure you discuss any questions you have with your health care provider. Document Released: 08/30/2015 Document Revised: 04/14/2018 Document Reviewed: 04/14/2018 Elsevier Patient Education  2020 [place], [place] [facility]

## 2019-05-19 NOTE — Addendum Note (Signed)
Addended by: Suzette Battiest on: 05/19/2019 08:17 AM   Modules accepted: Orders

## 2019-05-19 NOTE — Addendum Note (Signed)
Addended by: Westley Hummer B on: 05/19/2019 05:34 PM   Modules accepted: Orders

## 2019-05-20 ENCOUNTER — Other Ambulatory Visit: Payer: Self-pay

## 2019-05-21 LAB — QUANTIFERON-TB GOLD PLUS
Mitogen-NIL: 7.4 IU/mL
NIL: 0.11 IU/mL
QuantiFERON-TB Gold Plus: NEGATIVE
TB1-NIL: 0.01 IU/mL
TB2-NIL: 0.01 IU/mL

## 2019-05-22 MED ORDER — ONDANSETRON HCL 8 MG PO TABS: 8.0000 mg | ORAL_TABLET | Freq: Three times a day (TID) | ORAL | 0 refills | Status: DC | PRN

## 2019-05-25 ENCOUNTER — Encounter: Payer: No Typology Code available for payment source | Admitting: Internal Medicine

## 2019-06-07 ENCOUNTER — Encounter: Payer: Self-pay | Admitting: Internal Medicine

## 2019-06-07 ENCOUNTER — Telehealth: Payer: Self-pay

## 2019-06-07 NOTE — Telephone Encounter (Signed)
Copied from [place] [id number]. Topic: General - Other >> Jun 07, 2019  4:13 PM Rainey Pines A wrote: Patient is requesting a copy of her TB skin test and most recent flu shot.

## 2019-06-08 NOTE — Telephone Encounter (Signed)
Copies ready for pick up and patient is aware

## 2019-06-26 ENCOUNTER — Encounter: Payer: Self-pay | Admitting: Internal Medicine

## 2019-06-27 ENCOUNTER — Other Ambulatory Visit: Payer: Self-pay | Admitting: Internal Medicine

## 2019-06-27 DIAGNOSIS — G8929 Other chronic pain: Secondary | ICD-10-CM

## 2019-06-27 MED ORDER — PREDNISONE 10 MG (21) PO TBPK: ORAL_TABLET | ORAL | 0 refills | Status: DC

## 2019-07-03 ENCOUNTER — Other Ambulatory Visit: Payer: No Typology Code available for payment source

## 2019-07-06 ENCOUNTER — Ambulatory Visit: Payer: No Typology Code available for payment source | Admitting: Endocrinology

## 2019-07-07 ENCOUNTER — Emergency Department (HOSPITAL_COMMUNITY): Payer: Self-pay

## 2019-07-07 ENCOUNTER — Encounter (HOSPITAL_COMMUNITY): Payer: Self-pay | Admitting: Emergency Medicine

## 2019-07-07 ENCOUNTER — Other Ambulatory Visit: Payer: Self-pay

## 2019-07-07 DIAGNOSIS — R519 Headache, unspecified: Secondary | ICD-10-CM | POA: Insufficient documentation

## 2019-07-07 DIAGNOSIS — I129 Hypertensive chronic kidney disease with stage 1 through stage 4 chronic kidney disease, or unspecified chronic kidney disease: Secondary | ICD-10-CM | POA: Insufficient documentation

## 2019-07-07 DIAGNOSIS — R0789 Other chest pain: Secondary | ICD-10-CM | POA: Insufficient documentation

## 2019-07-07 DIAGNOSIS — Z79899 Other long term (current) drug therapy: Secondary | ICD-10-CM | POA: Insufficient documentation

## 2019-07-07 DIAGNOSIS — M79604 Pain in right leg: Secondary | ICD-10-CM | POA: Insufficient documentation

## 2019-07-07 DIAGNOSIS — M79605 Pain in left leg: Secondary | ICD-10-CM | POA: Insufficient documentation

## 2019-07-07 DIAGNOSIS — Z85850 Personal history of malignant neoplasm of thyroid: Secondary | ICD-10-CM | POA: Insufficient documentation

## 2019-07-07 DIAGNOSIS — N183 Chronic kidney disease, stage 3 unspecified: Secondary | ICD-10-CM | POA: Insufficient documentation

## 2019-07-07 LAB — BASIC METABOLIC PANEL
Anion gap: 6 (ref 5–15)
BUN: 28 mg/dL — ABNORMAL HIGH (ref 6–20)
CO2: 29 mmol/L (ref 22–32)
Calcium: 9.7 mg/dL (ref 8.9–10.3)
Chloride: 105 mmol/L (ref 98–111)
Creatinine, Ser: 1.8 mg/dL — ABNORMAL HIGH (ref 0.44–1.00)
GFR calc Af Amer: 37 mL/min — ABNORMAL LOW (ref >60)
GFR calc non Af Amer: 32 mL/min — ABNORMAL LOW (ref >60)
Glucose, Bld: 109 mg/dL — ABNORMAL HIGH (ref 70–99)
Potassium: 4.7 mmol/L (ref 3.5–5.1)
Sodium: 140 mmol/L (ref 135–145)

## 2019-07-07 LAB — CBC
HCT: 38.3 % (ref 36.0–46.0)
Hemoglobin: 11.9 g/dL — ABNORMAL LOW (ref 12.0–15.0)
MCH: 28.3 pg (ref 26.0–34.0)
MCHC: 31.1 g/dL (ref 30.0–36.0)
MCV: 91.2 fL (ref 80.0–100.0)
Platelets: 270 10*3/uL (ref 150–400)
RBC: 4.2 MIL/uL (ref 3.87–5.11)
RDW: 13.7 % (ref 11.5–15.5)
WBC: 9.2 10*3/uL (ref 4.0–10.5)
nRBC: 0 % (ref 0.0–0.2)

## 2019-07-07 LAB — TROPONIN I (HIGH SENSITIVITY): Troponin I (High Sensitivity): <2 ng/L (ref <18)

## 2019-07-07 NOTE — ED Triage Notes (Signed)
C/C central chest pain, nausea and SOB since Sunday. Pain radiates to her entire back. Denies any diarrhea, fevers or abd pain.

## 2019-07-08 ENCOUNTER — Emergency Department (HOSPITAL_COMMUNITY)
Admission: EM | Admit: 2019-07-08 | Discharge: 2019-07-08 | Disposition: A | Discharge status: Home / Self Care | Payer: Self-pay | Attending: Emergency Medicine | Admitting: Emergency Medicine

## 2019-07-08 DIAGNOSIS — R519 Headache, unspecified: Secondary | ICD-10-CM

## 2019-07-08 DIAGNOSIS — R0789 Other chest pain: Secondary | ICD-10-CM

## 2019-07-08 DIAGNOSIS — M79604 Pain in right leg: Secondary | ICD-10-CM

## 2019-07-08 DIAGNOSIS — M79605 Pain in left leg: Secondary | ICD-10-CM

## 2019-07-08 MED ORDER — HYDROCODONE-ACETAMINOPHEN 5-325 MG PO TABS: 2.0000 | ORAL_TABLET | ORAL | 0 refills | Status: DC | PRN

## 2019-07-08 NOTE — ED Provider Notes (Signed)
[facility] DEPT Provider Note   CSN: [id number] Arrival date & time: 07/07/19  2254     History   Chief Complaint No chief complaint on file.   HPI Rhonda [name] is a 48 y.o. female.     Patient is a 48 year old female with past medical history of anxiety, chronic renal insufficiency, thyroid cancer.  She presents today for evaluation of multiple complaints.  She describes pain in her chest, back, legs, and headache.  This is been ongoing for the past several days.  She denies any specific injury or trauma or any new activities that would have made her hurt.  She denies any fevers or chills.  She denies any shortness of breath.  Patient is a CNA at a nursing home and gets tested weekly for Covid.  Most recently she was tested yesterday, but does not know the results.  The history is provided by the patient.    Past Medical History:  Diagnosis Date   Anxiety    C1q nephropathy    Cancer ([place])    history of thyroid cancer   Chronic constipation    CKD (chronic kidney disease), stage II nephrologist-  dr Gretchen Short [facility])   secondary to C1q nephropathy/  hypertension   Family history of adverse reaction to anesthesia    father hard to put down for anesthesia   GERD (gastroesophageal reflux disease)    H/O radioactive iodine thyroid ablation    1992  right throid   History of exercise intolerance    01-052011  ETT--  normal w/ no ischemia per cardiologist (dr Aundra Dubin) epic note   History of pulmonary embolus (PE)    07/ 2008-- treated w/ coumadin   History of syncope    recurrent --  workup done per epic -- dx orthostatic hypotension   Hypertension    IBS (irritable bowel syndrome)    Pelvic pain in female    Post-surgical hypothyroidism    1991 left thyroidectomy   Proteinuria    Seasonal and perennial allergic rhinitis    Wears contact lenses     Patient Active Problem List   Diagnosis Date Noted   Vitamin D deficiency 05/19/2019   IGT (impaired glucose tolerance) 05/19/2019   Exposure to medical diagnostic radiation 02/09/2017   S/P bilateral salpingo-oophorectomy 04/28/2016   Pelvic adhesions 03/26/2016    Class: Present on Admission   History of pulmonary embolism 08/01/2014   CKD (chronic kidney disease) stage 3, GFR 30-59 ml/min (HCC) 02/27/2014   Hypothyroidism 04/28/2012   Hemorrhage of rectum and anus 02/09/2011   Constipation 02/09/2011   Dyslipidemia 08/26/2009   Backache 02/14/2008   Allergic rhinitis 11/15/2007   GERD 06/07/2007   Anxiety state 04/27/2007   Essential hypertension 04/27/2007   Disorder of kidney and ureter 03/17/2007   EMBOLISM/THROMBOSIS, DEEP VSL PRXML LWR EXTRM 03/16/2007    Past Surgical History:  Procedure Laterality Date   24 HOUR [place] STUDY N/A 11/16/2016   Procedure: 24 HOUR [place] STUDY;  Surgeon: Doran Stabler, MD;  Location: [facility];  Service: Gastroenterology;  Laterality: N/A;   ABDOMINAL HYSTERECTOMY  1998   CYSTOSCOPY N/A 04/28/2016   Procedure: CYSTOSCOPY;  Surgeon: Arvella Nigh, MD;  Location: [facility];  Service: Gynecology;  Laterality: N/A;   ESOPHAGEAL MANOMETRY N/A 11/16/2016   Procedure: ESOPHAGEAL MANOMETRY (EM);  Surgeon: Doran Stabler, MD;  Location: [facility];  Service: Gastroenterology;  Laterality: N/A;   ESOPHAGOGASTRODUODENOSCOPY  LAPAROSCOPIC CHOLECYSTECTOMY  1997   LAPAROSCOPY N/A 03/26/2016   Procedure: LAPAROSCOPY DIAGNOSTIC, LYSIS OF ADHESIONS;  Surgeon: Arvella Nigh, MD;  Location: [facility];  Service: Gynecology;  Laterality: N/A;   LAPAROTOMY N/A 04/28/2016   Procedure: EXPLORATORY LAPAROTOMY;  Surgeon: Arvella Nigh, MD;  Location: [facility];  Service: Gynecology;  Laterality: N/A;   RENAL BIOPSY     SALPINGOOPHORECTOMY Bilateral 04/28/2016   Procedure: Bilateral OOPHORECTOMY;  Surgeon: Arvella Nigh, MD;  Location: [facility];  Service: Gynecology;   Laterality: Bilateral;   THYROIDECTOMY Left 1991   TRANSTHORACIC ECHOCARDIOGRAM  04/27/2012   normal LV, ef 60-65%/  trivial MR   TUBAL LIGATION  1992     OB History   No obstetric history on file.      Home Medications    Prior to Admission medications   Medication Sig Start Date End Date Taking? Authorizing Provider  albuterol (PROVENTIL HFA;VENTOLIN HFA) 108 (90 Base) MCG/ACT inhaler INHALE 2 PUFFS INTO THE LUNGS EVERY 6 HOURS AS NEEDED 08/01/18   Lucretia Kern, DO  ALPRAZolam Duanne Moron) 0.25 MG tablet Take 1 tablet (0.25 mg total) by mouth 2 (two) times daily as needed for anxiety. 11/30/16   Marletta Lor, MD  atorvastatin (LIPITOR) 40 MG tablet Take 1 tablet (40 mg total) by mouth daily. 05/19/19   Isaac Bliss, Rayford Halsted, MD  busPIRone (BUSPAR) 15 MG tablet Take 1 tablet (15 mg total) by mouth at bedtime. Start with one half tablet nightly for one week, then increase to one whole tablet nightly 04/05/19   Doran Stabler, MD  cephALEXin (KEFLEX) 500 MG capsule Take 1 capsule (500 mg total) by mouth 2 (two) times daily. 05/10/19   Hassell Done, Mary-Margaret, FNP  cyclobenzaprine (FLEXERIL) 5 MG tablet Take 1 tablet (5 mg total) by mouth 2 (two) times daily as needed for muscle spasms (and back pain). 12/06/18   Isaac Bliss, Rayford Halsted, MD  ipratropium (ATROVENT) 0.06 % nasal spray Place 2 sprays into both nostrils 4 (four) times daily. 08/02/18   Vivi Barrack, MD  levothyroxine (SYNTHROID) 100 MCG tablet Take 1 tablet (100 mcg total) by mouth daily. 04/06/19   Elayne Snare, MD  metoprolol succinate (TOPROL-XL) 50 MG 24 hr tablet TAKE 1 TABLET BY MOUTH EVERY DAY IMMEDIATELY FOLLOWING A MEAL 04/14/19   Isaac Bliss, Rayford Halsted, MD  olmesartan-hydrochlorothiazide (BENICAR HCT) 40-25 MG tablet Take 1 tablet by mouth daily. 12/08/17   Marletta Lor, MD  omeprazole (PRILOSEC) 40 MG capsule Take 1 capsule (40 mg total) by mouth daily. 12/01/18   Isaac Bliss, Rayford Halsted, MD    ondansetron (ZOFRAN) 8 MG tablet Take 1 tablet (8 mg total) by mouth every 8 (eight) hours as needed for nausea or vomiting. 05/22/19   Doran Stabler, MD  predniSONE (STERAPRED UNI-PAK 21 TAB) 10 MG (21) TBPK tablet Take as directed 06/27/19   Isaac Bliss, Rayford Halsted, MD  promethazine (PHENERGAN) 25 MG tablet 1 tab every 8 hours as needed for nausea due to headache 11/04/18   Isaac Bliss, Rayford Halsted, MD  Vitamin D, Ergocalciferol, (DRISDOL) 1.25 MG (50000 UT) CAPS capsule Take 1 capsule (50,000 Units total) by mouth every 7 (seven) days for 12 doses. 05/19/19 08/05/19  Erline Hau, MD    Family History Family History  Problem Relation Age of Onset   Hypertension Father    Coronary artery disease Father    Diabetes Father    Stroke Father  Prostate cancer Father    Diabetes Mother        Pre-diabetic   Hypertension Mother    Breast cancer Maternal Aunt    Thyroid disease Maternal Aunt    Colon cancer Neg Hx    Colon polyps Neg Hx    Kidney disease Neg Hx    Esophageal cancer Neg Hx    Gallbladder disease Neg Hx     Social History Social History   Tobacco Use   Smoking status: Never Smoker   Smokeless tobacco: Never Used  Substance Use Topics   Alcohol use: No    Alcohol/week: 0.0 standard drinks   Drug use: No     Allergies   Aspirin, Flagyl [metronidazole], Food, Tramadol, and Dilaudid [hydromorphone]   Review of Systems Review of Systems  All other systems reviewed and are negative.    Physical Exam Updated Vital Signs BP (!) 135/91 (BP Location: Right Arm) Comment: Simultaneous filing. User may not have seen previous data.   Pulse 64    Temp 98.5 F (36.9 C) (Oral)    Resp (!) 9    Ht 5\' 6"  (1.676 m)    Wt 103.9 kg    SpO2 99%    BMI 36.96 kg/m   Physical Exam Vitals signs and nursing note reviewed.  Constitutional:      General: She is not in acute distress.    Appearance: She is well-developed. She is not  diaphoretic.  HENT:     Head: Normocephalic and atraumatic.  Eyes:     Extraocular Movements: Extraocular movements intact.     Pupils: Pupils are equal, round, and reactive to light.  Neck:     Musculoskeletal: Normal range of motion and neck supple.  Cardiovascular:     Rate and Rhythm: Normal rate and regular rhythm.     Heart sounds: No murmur. No friction rub. No gallop.   Pulmonary:     Effort: Pulmonary effort is normal. No respiratory distress.     Breath sounds: Normal breath sounds. No wheezing.  Abdominal:     General: Bowel sounds are normal. There is no distension.     Palpations: Abdomen is soft.     Tenderness: There is no abdominal tenderness.  Musculoskeletal: Normal range of motion.        General: No swelling, tenderness, deformity or signs of injury.     Right lower leg: No edema.     Left lower leg: No edema.  Skin:    General: Skin is warm and dry.  Neurological:     Mental Status: She is alert and oriented to person, place, and time.     Cranial Nerves: No cranial nerve deficit.     Motor: No weakness.     Coordination: Coordination normal.      ED Treatments / Results  Labs (all labs ordered are listed, but only abnormal results are displayed) Labs Reviewed  BASIC METABOLIC PANEL - Abnormal; Notable for the following components:      Result Value   Glucose, Bld 109 (*)    BUN 28 (*)    Creatinine, Ser 1.80 (*)    GFR calc non Af Amer 32 (*)    GFR calc Af Amer 37 (*)    All other components within normal limits  CBC - Abnormal; Notable for the following components:   Hemoglobin 11.9 (*)    All other components within normal limits  TROPONIN I (HIGH SENSITIVITY)  TROPONIN I (HIGH SENSITIVITY)  EKG EKG Interpretation  Date/Time:  Friday July 07 2019 23:11:25 EST Ventricular Rate:  62 PR Interval:    QRS Duration: 66 QT Interval:  495 QTC Calculation: 503 R Axis:   73 Text Interpretation: Sinus rhythm Borderline T abnormalities,  diffuse leads Borderline prolonged QT interval Baseline wander in lead(s) II aVR No significant change was found Confirmed by Ezequiel Essex [phone]) on 07/07/2019 11:15:51 PM   Radiology Dg Chest 2 View  Result Date: 07/07/2019 CLINICAL DATA:  Chest pain, central chest pain and shortness of breath since Sunday. EXAM: CHEST - 2 VIEW COMPARISON:  Chest radiograph 07/04/2018 FINDINGS: No consolidation, features of edema, pneumothorax, or effusion. Pulmonary vascularity is normally distributed. The cardiomediastinal contours are unremarkable. No acute osseous or soft tissue abnormality. Surgical clips at the base of the neck, likely related to prior thyroidectomy. IMPRESSION: No acute cardiopulmonary abnormality. Electronically Signed   By: Lovena Le M.D.   On: 07/07/2019 23:30    Procedures Procedures (including critical care time)  Medications Ordered in ED Medications - No data to display   Initial Impression / Assessment and Plan / ED Course  I have reviewed the triage vital signs and the nursing notes.  Pertinent labs & imaging results that were available during my care of the patient were reviewed by me and considered in my medical decision making (see chart for details).  Patient presenting here with multiple complaints as described in the HPI.  She reports leg pain, chest pain, back pain, and headache.  I am uncertain as to how to connect these symptoms.  Her vitals are stable, she is afebrile, there is no hypoxia, and her physical examination is basically unremarkable.  At this point, I see no evidence for an emergent situation.  I will treat with an anti-inflammatory and tramadol for her pain.  She is to follow-up with primary doctor if she is not improving and return to the ER if symptoms worsen or change.  Final Clinical Impressions(s) / ED Diagnoses   Final diagnoses:  None    ED Discharge Orders    None       Veryl Speak, MD 07/08/19 [phone]

## 2019-07-08 NOTE — Discharge Instructions (Addendum)
Ibuprofen 600 mg 3 times daily for the next several days.  Hydrocodone as prescribed as needed for pain not relieved with ibuprofen.  Drink plenty of fluids and get plenty of rest.  Return to the emergency department if you develop worsening pain, difficulty breathing, high fevers, or other new and concerning symptoms.

## 2019-07-29 ENCOUNTER — Other Ambulatory Visit: Payer: Self-pay | Admitting: Endocrinology

## 2019-08-09 ENCOUNTER — Other Ambulatory Visit: Payer: Self-pay | Admitting: *Deleted

## 2019-08-09 MED ORDER — OMEPRAZOLE 40 MG PO CPDR: 40.0000 mg | DELAYED_RELEASE_CAPSULE | Freq: Every day | ORAL | 1 refills | Status: DC

## 2019-08-21 ENCOUNTER — Encounter: Payer: Self-pay | Admitting: Internal Medicine

## 2019-08-21 NOTE — Telephone Encounter (Signed)
Spoke with the pt and offered an appt for tomorrow with another provider as Dr Jerilee Hoh does not have any openings.  Appt scheduled for tomorrow at 9am with Dr Elease Hashimoto and the pt was advised and agreed to seek treatment in the ER if she feels worse prior to this appt.

## 2019-08-22 ENCOUNTER — Other Ambulatory Visit: Payer: Self-pay

## 2019-08-22 ENCOUNTER — Other Ambulatory Visit: Payer: No Typology Code available for payment source

## 2019-08-22 ENCOUNTER — Telehealth (INDEPENDENT_AMBULATORY_CARE_PROVIDER_SITE_OTHER): Payer: PRIVATE HEALTH INSURANCE | Admitting: Family Medicine

## 2019-08-22 DIAGNOSIS — U071 COVID-19: Secondary | ICD-10-CM

## 2019-08-22 NOTE — Progress Notes (Signed)
This visit type was conducted due to national recommendations for restrictions regarding the COVID-19 pandemic in an effort to limit this patient's exposure and mitigate transmission in our community.   Virtual Visit via Telephone Note  I connected with Rhonda [name] on 08/22/19 at  9:00 AM EST by telephone and verified that I am speaking with the correct person using two identifiers.   I discussed the limitations, risks, security and privacy concerns of performing an evaluation and management service by telephone and the availability of in person appointments. I also discussed with the patient that there may be a patient responsible charge related to this service. The patient expressed understanding and agreed to proceed.  Location patient: home Location provider: work or home office Participants present for the call: patient, provider Patient did not have a visit in the prior 7 days to address this/these issue(s).   History of Present Illness: Patient has positive Covid diagnosis.  She works as a Quarry manager at a nursing home and she noted onset of some symptoms last Friday.  She had some cough, headache, chills without fever, body aches.  She got tested with a rapid test at work on Saturday which came back positive.  She has had some soft stools but no diarrhea.  Her major health issues are chronic kidney disease and hypertension.  She has had some nausea without vomiting.  No abdominal pain.  No dysuria.  She does have some Zofran at home to take for nausea.  She lives with her mother-in-law and husband.  They are not sick.  She is trying to stay very isolated from them.  Past Medical History:  Diagnosis Date  . Anxiety   . C1q nephropathy   . Cancer [facility])    history of thyroid cancer  . Chronic constipation   . CKD (chronic kidney disease), stage II nephrologist-  dr Gretchen Short [facility])   secondary to C1q nephropathy/  hypertension  . Family history of  adverse reaction to anesthesia    father hard to put down for anesthesia  . GERD (gastroesophageal reflux disease)   . H/O radioactive iodine thyroid ablation    1992  right throid  . History of exercise intolerance    01-052011  ETT--  normal w/ no ischemia per cardiologist (dr Aundra Dubin) epic note  . History of pulmonary embolus (PE)    07/ 2008-- treated w/ coumadin  . History of syncope    recurrent --  workup done per epic -- dx orthostatic hypotension  . Hypertension   . IBS (irritable bowel syndrome)   . Pelvic pain in female   . 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 Proteinuria   . Seasonal and perennial allergic rhinitis   . Wears contact lenses    Past Surgical History:  Procedure Laterality Date  . [place] STUDY N/A 11/16/2016   Procedure: [place] STUDY;  Surgeon: Doran Stabler, MD;  Location: [facility];  Service: Gastroenterology;  Laterality: N/A;  . ABDOMINAL HYSTERECTOMY  1998  . CYSTOSCOPY N/A 04/28/2016   Procedure: CYSTOSCOPY;  Surgeon: Arvella Nigh, MD;  Location: [facility];  Service: Gynecology;  Laterality: N/A;  . ESOPHAGEAL MANOMETRY N/A 11/16/2016   Procedure: ESOPHAGEAL MANOMETRY (EM);  Surgeon: Doran Stabler, MD;  Location: [facility];  Service: Gastroenterology;  Laterality: N/A;  . ESOPHAGOGASTRODUODENOSCOPY    . LAPAROSCOPIC CHOLECYSTECTOMY  1997  . LAPAROSCOPY N/A 03/26/2016   Procedure: LAPAROSCOPY DIAGNOSTIC, LYSIS OF  ADHESIONS;  Surgeon: Arvella Nigh, MD;  Location: [facility];  Service: Gynecology;  Laterality: N/A;  . LAPAROTOMY N/A 04/28/2016   Procedure: EXPLORATORY LAPAROTOMY;  Surgeon: Arvella Nigh, MD;  Location: [facility];  Service: Gynecology;  Laterality: N/A;  . RENAL BIOPSY    . SALPINGOOPHORECTOMY Bilateral 04/28/2016   Procedure: Bilateral OOPHORECTOMY;  Surgeon: Arvella Nigh, MD;  Location: [facility];  Service: Gynecology;  Laterality: Bilateral;  . THYROIDECTOMY Left 1991  . TRANSTHORACIC  ECHOCARDIOGRAM  04/27/2012   normal LV, ef 60-65%/  trivial MR  . TUBAL LIGATION  1992    reports that she has never smoked. She has never used smokeless tobacco. She reports that she does not drink alcohol or use drugs. family history includes Breast cancer in her maternal aunt; Coronary artery disease in her father; Diabetes in her father and mother; Hypertension in her father and mother; Prostate cancer in her father; Stroke in her father; Thyroid disease in her maternal aunt. Allergies  Allergen Reactions  . Aspirin Hives  . Flagyl [Metronidazole] Nausea And Vomiting    With oral flagyl.   Has tolerated vaginal flagyl.    . Food Other (See Comments)    Lima beans--  Positive allergy test   . Tramadol Other (See Comments)    Headache   . Dilaudid [Hydromorphone] Itching      Observations/Objective: Patient sounds cheerful and well on the phone. I do not appreciate any SOB. Speech and thought processing are grossly intact. Patient reported vitals:  Assessment and Plan:  COVID-19 diagnosis.  Patient symptomatically stable.  She does have some mild shortness of breath with activity but no significant shortness of breath at rest  [facility] of fluids and rest -Offered cough medication but she declines at this time -She knows to stay quarantined minimum of 10 days from onset of symptoms -Follow-up immediately for any increased shortness of breath or other concerns  Follow Up Instructions:  -As above   99441 5-10 [id number] 11-20 99443 21-30 I did not refer this patient for an OV in the next 24 hours for this/these issue(s).  I discussed the assessment and treatment plan with the patient. The patient was provided an opportunity to ask questions and all were answered. The patient agreed with the plan and demonstrated an understanding of the instructions.   The patient was advised to call back or seek an in-person evaluation if the symptoms worsen or if the condition fails to improve  as anticipated.  I provided 14 minutes of non-face-to-face time during this encounter.   Carolann Littler, MD

## 2019-08-23 ENCOUNTER — Ambulatory Visit: Payer: Self-pay | Admitting: Internal Medicine

## 2019-08-24 ENCOUNTER — Other Ambulatory Visit: Payer: Self-pay | Admitting: Internal Medicine

## 2019-08-25 ENCOUNTER — Encounter: Payer: Self-pay | Admitting: Internal Medicine

## 2019-08-28 ENCOUNTER — Ambulatory Visit: Payer: No Typology Code available for payment source | Admitting: Endocrinology

## 2019-08-30 ENCOUNTER — Emergency Department (HOSPITAL_COMMUNITY)
Admission: EM | Admit: 2019-08-30 | Discharge: 2019-08-30 | Disposition: A | Discharge status: Home / Self Care | Payer: Self-pay | Attending: Emergency Medicine | Admitting: Emergency Medicine

## 2019-08-30 ENCOUNTER — Encounter (HOSPITAL_COMMUNITY): Payer: Self-pay

## 2019-08-30 ENCOUNTER — Other Ambulatory Visit: Payer: Self-pay

## 2019-08-30 ENCOUNTER — Emergency Department (HOSPITAL_COMMUNITY): Payer: Self-pay

## 2019-08-30 ENCOUNTER — Ambulatory Visit: Payer: Self-pay | Admitting: *Deleted

## 2019-08-30 DIAGNOSIS — R079 Chest pain, unspecified: Secondary | ICD-10-CM

## 2019-08-30 DIAGNOSIS — U071 COVID-19: Secondary | ICD-10-CM | POA: Insufficient documentation

## 2019-08-30 DIAGNOSIS — N182 Chronic kidney disease, stage 2 (mild): Secondary | ICD-10-CM | POA: Insufficient documentation

## 2019-08-30 DIAGNOSIS — I129 Hypertensive chronic kidney disease with stage 1 through stage 4 chronic kidney disease, or unspecified chronic kidney disease: Secondary | ICD-10-CM | POA: Insufficient documentation

## 2019-08-30 LAB — COMPREHENSIVE METABOLIC PANEL
ALT: 26 U/L (ref 0–44)
AST: 20 U/L (ref 15–41)
Albumin: 3.8 g/dL (ref 3.5–5.0)
Alkaline Phosphatase: 102 U/L (ref 38–126)
Anion gap: 6 (ref 5–15)
BUN: 21 mg/dL — ABNORMAL HIGH (ref 6–20)
CO2: 30 mmol/L (ref 22–32)
Calcium: 9.8 mg/dL (ref 8.9–10.3)
Chloride: 105 mmol/L (ref 98–111)
Creatinine, Ser: 1.43 mg/dL — ABNORMAL HIGH (ref 0.44–1.00)
GFR calc Af Amer: 49 mL/min — ABNORMAL LOW (ref >60)
GFR calc non Af Amer: 43 mL/min — ABNORMAL LOW (ref >60)
Glucose, Bld: 121 mg/dL — ABNORMAL HIGH (ref 70–99)
Potassium: 3.8 mmol/L (ref 3.5–5.1)
Sodium: 141 mmol/L (ref 135–145)
Total Bilirubin: 0.6 mg/dL (ref 0.3–1.2)
Total Protein: 7.9 g/dL (ref 6.5–8.1)

## 2019-08-30 LAB — CBC
HCT: 39.2 % (ref 36.0–46.0)
Hemoglobin: 12.3 g/dL (ref 12.0–15.0)
MCH: 27.9 pg (ref 26.0–34.0)
MCHC: 31.4 g/dL (ref 30.0–36.0)
MCV: 88.9 fL (ref 80.0–100.0)
Platelets: 255 10*3/uL (ref 150–400)
RBC: 4.41 MIL/uL (ref 3.87–5.11)
RDW: 13.4 % (ref 11.5–15.5)
WBC: 7.8 10*3/uL (ref 4.0–10.5)
nRBC: 0 % (ref 0.0–0.2)

## 2019-08-30 LAB — TROPONIN I (HIGH SENSITIVITY): Troponin I (High Sensitivity): <2 ng/L (ref <18)

## 2019-08-30 MED ORDER — IOHEXOL 350 MG/ML SOLN
100.0000 mL | Freq: Once | INTRAVENOUS | Status: AC | PRN
  Administered 2019-08-30: 100 mL via INTRAVENOUS

## 2019-08-30 MED ORDER — SODIUM CHLORIDE (PF) 0.9 % IJ SOLN
INTRAMUSCULAR | Status: AC
  Filled 2019-08-30: qty 50

## 2019-08-30 NOTE — Telephone Encounter (Addendum)
Patient calls in with 3 episodes of chest pain that radiates to the back and has, once radiated to the left arm. SOB and cough. Is Covid positive since 08/19/19. Dull pain that turns to sharp pain that last only seconds. Third episode occurring at this time. Denies fever today. Advised ED with a mask on at this time. Called [facility] ED to report patient en route to facility.  Rerouting to pcp.   Reason for Disposition . [1] Chest pain lasts > 5 minutes AND [2] age > 31  Answer Assessment - Initial Assessment Questions 1. LOCATION: "Where does it hurt?"      Center chest pain that radiates to the back.Possible the pain radiated to the left arm once before.  2. RADIATION: "Does the pain go anywhere else?" (e.g., into neck, jaw, arms, back)     To the back 3. ONSET: "When did the chest pain begin?" (Minutes, hours or days)      Once last week, yesterday and again this morning. 4. PATTERN "Does the pain come and go, or has it been constant since it started?"  "Does it get worse with exertion?"      Comes and goes 5. DURATION: "How long does it last" (e.g., seconds, minutes, hours)     Few minutes6. SEVERITY: "How bad is the pain?"  (e.g., Scale 1-10; mild, moderate, or severe)    - MILD (1-3): doesn't interfere with normal activities     - MODERATE (4-7): interferes with normal activities or awakens from sleep    - SEVERE (8-10): excruciating pain, unable to do any normal activities       5 7. CARDIAC RISK FACTORS: "Do you have any history of heart problems or risk factors for heart disease?" (e.g., angina, prior heart attack; diabetes, high blood pressure, high cholesterol, smoker, or strong family history of heart disease)     htn and cholesterol is high.Father has heart disease.  8. PULMONARY RISK FACTORS: "Do you have any history of lung disease?"  (e.g., blood clots in lung, asthma, emphysema, birth control pills)    Blood clot in 2008, 9. CAUSE: "What do you think is causing the chest pain?"     No idea 10. OTHER SYMPTOMS: "Do you have any other symptoms?" (e.g., dizziness, nausea, vomiting, sweating, fever, difficulty breathing, cough)      Slight SOB with coughing. None of the other sxs 11. PREGNANCY: "Is there any chance you are pregnant?" "When was your last menstrual period?"       no  Protocols used: CHEST PAIN-A-AH

## 2019-08-30 NOTE — ED Provider Notes (Signed)
5:37 PM Patient awake, alert, sitting upright in no distress.  A reviewed CT findings with her, unremarkable.  Patient appropriate for discharge.    Carmin Muskrat, MD 08/30/19 [phone]

## 2019-08-30 NOTE — Telephone Encounter (Signed)
FYI

## 2019-08-30 NOTE — Telephone Encounter (Signed)
Not our pt

## 2019-08-30 NOTE — ED Provider Notes (Signed)
[facility] Emergency Department Provider Note MRN:  [id number]  Arrival date & time: 08/30/19     Chief Complaint   Chest pain History of Present Illness   Rhonda [name] is a 49 y.o. year-old female with a history of CKD, DVT presenting to the ED with chief complaint of chest pain.  Patient was diagnosed with COVID-19 on January 2.  She has been experiencing general malaise, fatigue, nasal congestion.  She began having intermittent chest pain yesterday and it has continued today.  She was sent here by her doctor for evaluation.  The pain is in the center of the chest, it is not changed with palpation, it is worse with deep breathing.  It is occasionally worse with laying flat.  It is unchanged with meals.  She denies shortness of breath, no leg pain or swelling, no abdominal pain.  Pain is mild to moderate, no other exacerbating or alleviating factors.  Review of Systems  A complete 10 system review of systems was obtained and all systems are negative except as noted in the HPI and PMH.   Patient's Health History    Past Medical History:  Diagnosis Date  . Anxiety   . C1q nephropathy   . Cancer [facility])    history of thyroid cancer  . Chronic constipation   . CKD (chronic kidney disease), stage II nephrologist-  dr Gretchen Short [facility])   secondary to C1q nephropathy/  hypertension  . Family history of adverse reaction to anesthesia    father hard to put down for anesthesia  . GERD (gastroesophageal reflux disease)   . H/O radioactive iodine thyroid ablation    1992  right throid  . History of exercise intolerance    01-052011  ETT--  normal w/ no ischemia per cardiologist (dr Aundra Dubin) epic note  . History of pulmonary embolus (PE)    07/ 2008-- treated w/ coumadin  . History of syncope    recurrent --  workup done per epic -- dx orthostatic hypotension  . Hypertension   . IBS (irritable bowel syndrome)   . Pelvic pain  in female   . 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 Proteinuria   . Seasonal and perennial allergic rhinitis   . Wears contact lenses     Past Surgical History:  Procedure Laterality Date  . [place] STUDY N/A 11/16/2016   Procedure: [place] STUDY;  Surgeon: Doran Stabler, MD;  Location: [facility];  Service: Gastroenterology;  Laterality: N/A;  . ABDOMINAL HYSTERECTOMY  1998  . CYSTOSCOPY N/A 04/28/2016   Procedure: CYSTOSCOPY;  Surgeon: Arvella Nigh, MD;  Location: [facility];  Service: Gynecology;  Laterality: N/A;  . ESOPHAGEAL MANOMETRY N/A 11/16/2016   Procedure: ESOPHAGEAL MANOMETRY (EM);  Surgeon: Doran Stabler, MD;  [facility];  Service: Gastroenterology;  Laterality: N/A;  . ESOPHAGOGASTRODUODENOSCOPY    . LAPAROSCOPIC CHOLECYSTECTOMY  1997  . LAPAROSCOPY N/A 03/26/2016   Procedure: LAPAROSCOPY DIAGNOSTIC, LYSIS OF ADHESIONS;  Surgeon: Arvella Nigh, MD;  Location: [place];  Service: Gynecology;  Laterality: N/A;  . LAPAROTOMY N/A 04/28/2016   Procedure: EXPLORATORY LAPAROTOMY;  Surgeon: Arvella Nigh, MD;  Location: [facility];  Service: Gynecology;  Laterality: N/A;  . RENAL BIOPSY    . SALPINGOOPHORECTOMY Bilateral 04/28/2016   Procedure: Bilateral OOPHORECTOMY;  Surgeon: Arvella Nigh, MD;  Location: [facility];  Service: Gynecology;  Laterality: Bilateral;  . THYROIDECTOMY Left 1991  .  TRANSTHORACIC ECHOCARDIOGRAM  04/27/2012   normal LV, ef 60-65%/  trivial MR  . TUBAL LIGATION  1992    Family History  Problem Relation Age of Onset  . Hypertension Father   . Coronary artery disease Father   . Diabetes Father   . Stroke Father   . Prostate cancer Father   . Diabetes Mother        Pre-diabetic  . Hypertension Mother   . Breast cancer Maternal Aunt   . Thyroid disease Maternal Aunt   . Colon cancer Neg Hx   . Colon polyps Neg Hx   . Kidney disease Neg Hx   . Esophageal cancer Neg Hx   . Gallbladder disease Neg Hx       Social History   Socioeconomic History  . Marital status: Married    Spouse name: Not on file  . Number of children: 2  . Years of education: Not on file  . Highest education level: Not on file  Occupational History  . Occupation: NURSING ASSISTANT    Employer: [employer]  Tobacco Use  . Smoking status: Never Smoker  . Smokeless tobacco: Never Used  Substance and Sexual Activity  . Alcohol use: No    Alcohol/week: 0.0 standard drinks  . Drug use: No  . Sexual activity: Yes    Birth control/protection: Surgical  Other Topics Concern  . Not on file  Social History Narrative  . Not on file   Social Determinants of Health   Financial Resource Strain:   . Difficulty of Paying Living Expenses: Not on file  Food Insecurity:   . Worried About Charity fundraiser in the Last Year: Not on file  . Ran Out of Food in the Last Year: Not on file  Transportation Needs:   . Lack of Transportation (Medical): Not on file  . Lack of Transportation (Non-Medical): Not on file  Physical Activity:   . Days of Exercise per Week: Not on file  . Minutes of Exercise per Session: Not on file  Stress:   . Feeling of Stress : Not on file  Social Connections:   . Frequency of Communication with Friends and Family: Not on file  . Frequency of Social Gatherings with Friends and Family: Not on file  . Attends Religious Services: Not on file  . Active Member of Clubs or Organizations: Not on file  . Attends Archivist Meetings: Not on file  . Marital Status: Not on file  Intimate Partner Violence:   . Fear of Current or Ex-Partner: Not on file  . Emotionally Abused: Not on file  . Physically Abused: Not on file  . Sexually Abused: Not on file     Physical Exam  Vital Signs and Nursing Notes reviewed Vitals:   08/30/19 1330 08/30/19 1400  BP: (!) 120/95 117/88  Pulse: 75 62  Resp: 20 11  Temp:    SpO2: 98% 97%    CONSTITUTIONAL: Well-appearing, NAD NEURO:  Alert and  oriented x 3, no focal deficits EYES:  eyes equal and reactive ENT/NECK:  no LAD, no JVD CARDIO: Regular rate, well-perfused, normal S1 and S2 PULM:  CTAB no wheezing or rhonchi GI/GU:  normal bowel sounds, non-distended, non-tender MSK/SPINE:  No gross deformities, no edema SKIN:  no rash, atraumatic PSYCH:  Appropriate speech and behavior  Diagnostic and Interventional Summary    EKG Interpretation  Date/Time:  Wednesday August 30 2019 11:46:12 EST Ventricular Rate:  82 PR Interval:  QRS Duration: 88 QT Interval:  354 QTC Calculation: 414 R Axis:   80 Text Interpretation: Sinus rhythm Low voltage, precordial leads Nonspecific T abnormalities, diffuse leads Baseline wander in lead(s) V6 Confirmed by Gerlene Fee [phone]) on 08/30/2019 2:12:59 PM      Labs Reviewed  COMPREHENSIVE METABOLIC PANEL - Abnormal; Notable for the following components:      Result Value   Glucose, Bld 121 (*)    BUN 21 (*)    Creatinine, Ser 1.43 (*)    GFR calc non Af Amer 43 (*)    GFR calc Af Amer 49 (*)    All other components within normal limits  CBC  TROPONIN I (HIGH SENSITIVITY)    CTA Chest for PE    (Results Pending)    Medications - No data to display   Procedures  /  Critical Care Procedures  ED Course and Medical Decision Making  I have reviewed the triage vital signs, the nursing notes, and pertinent available records from the EMR.  Pertinent labs & imaging results that were available during my care of the patient were reviewed by me and considered in my medical decision making (see below for details).     History of DVT, active COVID-19 infection here with pleuritic chest pain.  Vital signs reassuring.  Suspect inflammation related to cough, however will be difficult to exclude PE without imaging.  CT pending.  Still awaiting CTA imaging, troponin is negative, if without acute process on CT would be appropriate for discharge.  Barth Kirks. Sedonia Small, [place] [email or web address]  Final Clinical Impressions(s) / ED Diagnoses     ICD-10-CM   1. Chest pain, unspecified type  R07.9   2. COVID-19  U07.1     ED Discharge Orders    None       Discharge Instructions Discussed with and Provided to Patient:   Discharge Instructions   None       Maudie Flakes, MD 08/30/19 1505

## 2019-08-30 NOTE — Discharge Instructions (Addendum)
As discussed, your evaluation today has been largely reassuring.  But, it is important that you monitor your condition carefully, and do not hesitate to return to the ED if you develop new, or concerning changes in your condition. ? ?Otherwise, please follow-up with your physician for appropriate ongoing care. ? ?

## 2019-08-30 NOTE — ED Notes (Signed)
Patient transported to CT 

## 2019-08-30 NOTE — ED Triage Notes (Signed)
Pt reports testing positive for COVID on 1/2. Pt endorses chest pain last week that went away but came back yesterday. Pt contacted PCP and was told to come here.

## 2019-09-08 ENCOUNTER — Other Ambulatory Visit: Payer: Self-pay

## 2019-09-08 ENCOUNTER — Other Ambulatory Visit (INDEPENDENT_AMBULATORY_CARE_PROVIDER_SITE_OTHER): Payer: PRIVATE HEALTH INSURANCE

## 2019-09-08 DIAGNOSIS — E890 Postprocedural hypothyroidism: Secondary | ICD-10-CM | POA: Diagnosis not present

## 2019-09-08 LAB — T4, FREE: Free T4: 0.97 ng/dL (ref 0.60–1.60)

## 2019-09-08 LAB — T3, FREE: T3, Free: 3.1 pg/mL (ref 2.3–4.2)

## 2019-09-08 LAB — TSH: TSH: 1.92 u[IU]/mL (ref 0.35–4.50)

## 2019-09-12 NOTE — Progress Notes (Signed)
Patient ID: Rhonda [name], female   DOB: 03/27/1971, 49 y.o.   MRN: [id number]             Reason for Appointment:  Hypothyroidism, follow-up visit  Today's office visit was provided via telemedicine using video technique The patient was explained the limitations of evaluation and management by telemedicine and the availability of in person appointments.  The patient understood the limitations and agreed to proceed. Patient also understood that the telehealth visit is billable. . Location of the patient: Patient's home . Location of the provider: Physician office Only the patient and myself were participating in the encounter    History of Present Illness:   Hypothyroidism was first diagnosed in 1991  Hypothyroidism apparently was secondary to surgery on her goiter and no details are available She was told that after the surgery there was a focus of cancer found in her thyroid but again no details available She subsequently had radioactive iodine treatment         The patient has been treated with variable doses of levothyroxine  To her initial consultation she had been on a stable dose of 150 mcg levothyroxine and apparently was feeling fairly good with this    However subsequently since 05/2018 despite no new symptoms at that time her TSH was 0.01 No prior levels of TSH are available on her record since 2016  RECENT HISTORY:  Her levothyroxine dose had been adjusted because of persistently low TSH in 2019 and also in 2020 She is now taking 100 mcg daily  She was told to reduce the dose in 03/2019 and subsequent TSH was not as low  She feels fairly good and has only some fatigue related to not sleeping well This is somewhat better No tiredness otherwise and no cold intolerance She thinks her weight is about the same The patient takes the thyroid supplement about an hour before breakfast and is taking it regularly with water  She also takes her other medications and  Prilosec at the same time However she now says that she also takes a chewable multiple vitamin at the same time and not clear of the composition of this         Her TSH which was low normal previously is now back up to 1.9  Patient's weight history is as follows:  Wt Readings from Last 3 Encounters:  07/07/19 229 lb (103.9 kg)  05/19/19 228 lb 4.8 oz (103.6 kg)  03/21/19 226 lb (102.5 kg)   Thyroid function results have been as follows:  Lab Results  Component Value Date   TSH 1.92 09/08/2019   TSH 0.78 05/19/2019   TSH 0.36 04/03/2019   TSH 0.57 11/29/2018   FREET4 0.97 09/08/2019   FREET4 1.05 04/03/2019   FREET4 0.89 11/29/2018   FREET4 0.97 09/27/2018   T3FREE 3.1 09/08/2019   T3FREE 3.7 09/27/2018   T3FREE 3.2 09/02/2018   HYPERCALCEMIA:  She had a high calcium in October 2019 However review of her medications indicates that she was previously not on HCTZ and has been on 25 mg HCTZ since middle of 2019  PTH level done from [place] lab was high but was normal from [place] in 2/20  Subsequently calcium levels have not been high and on the last 2 measurements back to normal  Lab Results  Component Value Date   CALCIUM 9.8 08/30/2019   CALCIUM 9.7 07/07/2019   CALCIUM 10.7 (H) 05/19/2019   CALCIUM 10.4 11/29/2018   CALCIUM 10.3 09/27/2018  CALCIUM 10.0 08/05/2018   CALCIUM 10.6 (H) 05/31/2018   Lab Results  Component Value Date   PTH 32 09/27/2018   CALCIUM 9.8 08/30/2019   CAION 5.40 08/05/2018     Past Medical History:  Diagnosis Date  . Anxiety   . C1q nephropathy   . Cancer [facility])    history of thyroid cancer  . Chronic constipation   . CKD (chronic kidney disease), stage II nephrologist-  dr Gretchen Short [facility])   secondary to C1q nephropathy/  hypertension  . Family history of adverse reaction to anesthesia    father hard to put down for anesthesia  . GERD (gastroesophageal reflux disease)   . H/O radioactive iodine  thyroid ablation    1992  right throid  . History of exercise intolerance    01-052011  ETT--  normal w/ no ischemia per cardiologist (dr Aundra Dubin) epic note  . History of pulmonary embolus (PE)    07/ 2008-- treated w/ coumadin  . History of syncope    recurrent --  workup done per epic -- dx orthostatic hypotension  . Hypertension   . IBS (irritable bowel syndrome)   . Pelvic pain in female   . 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 Proteinuria   . Seasonal and perennial allergic rhinitis   . Wears contact lenses     Past Surgical History:  Procedure Laterality Date  . [place] STUDY N/A 11/16/2016   Procedure: [place] STUDY;  Surgeon: Doran Stabler, MD;  [facility];  Service: Gastroenterology;  Laterality: N/A;  . ABDOMINAL HYSTERECTOMY  1998  . CYSTOSCOPY N/A 04/28/2016   Procedure: CYSTOSCOPY;  Surgeon: Arvella Nigh, MD;  Location: [place] [facility];  Service: Gynecology;  Laterality: N/A;  . ESOPHAGEAL MANOMETRY N/A 11/16/2016   Procedure: ESOPHAGEAL MANOMETRY (EM);  Surgeon: Doran Stabler, MD;  [facility];  Service: Gastroenterology;  Laterality: N/A;  . ESOPHAGOGASTRODUODENOSCOPY    . LAPAROSCOPIC CHOLECYSTECTOMY  1997  . LAPAROSCOPY N/A 03/26/2016   Procedure: LAPAROSCOPY DIAGNOSTIC, LYSIS OF ADHESIONS;  Surgeon: Arvella Nigh, MD;  Location: [place];  Service: Gynecology;  Laterality: N/A;  . LAPAROTOMY N/A 04/28/2016   Procedure: EXPLORATORY LAPAROTOMY;  Surgeon: Arvella Nigh, MD;  Location: [place] [facility];  Service: Gynecology;  Laterality: N/A;  . RENAL BIOPSY    . SALPINGOOPHORECTOMY Bilateral 04/28/2016   Procedure: Bilateral OOPHORECTOMY;  Surgeon: Arvella Nigh, MD;  Location: [facility];  Service: Gynecology;  Laterality: Bilateral;  . THYROIDECTOMY Left 1991  . TRANSTHORACIC ECHOCARDIOGRAM  04/27/2012   normal LV, ef 60-65%/  trivial MR  . TUBAL LIGATION  1992    Family History  Problem Relation Age of Onset  .  Hypertension Father   . Coronary artery disease Father   . Diabetes Father   . Stroke Father   . Prostate cancer Father   . Diabetes Mother        Pre-diabetic  . Hypertension Mother   . Breast cancer Maternal Aunt   . Thyroid disease Maternal Aunt   . Colon cancer Neg Hx   . Colon polyps Neg Hx   . Kidney disease Neg Hx   . Esophageal cancer Neg Hx   . Gallbladder disease Neg Hx     Social History:  reports that she has never smoked. She has never used smokeless tobacco. She reports that she does not drink alcohol or use drugs.  Allergies:   Allergies  Allergen Reactions  . Aspirin Hives  . Flagyl [Metronidazole] Nausea And Vomiting    With oral flagyl.   Has tolerated vaginal flagyl.    . Food Other (See Comments)    Lima beans--  Positive allergy test   . Tramadol Other (See Comments)    Headache   . Dilaudid [Hydromorphone] Itching    Allergies as of 09/13/2019      Reactions   Aspirin Hives   Flagyl [metronidazole] Nausea And Vomiting   With oral flagyl.   Has tolerated vaginal flagyl.     Food Other (See Comments)   Lima beans--  Positive allergy test    Tramadol Other (See Comments)   Headache    Dilaudid [hydromorphone] Itching      Medication List       Accurate as of September 12, 2019  9:15 PM. If you have any questions, ask your nurse or doctor.        albuterol 108 (90 Base) MCG/ACT inhaler Commonly known as: VENTOLIN HFA INHALE 2 PUFFS INTO THE LUNGS EVERY 6 HOURS AS NEEDED   ALPRAZolam 0.25 MG tablet Commonly known as: XANAX Take 1 tablet (0.25 mg total) by mouth 2 (two) times daily as needed for anxiety.   atorvastatin 40 MG tablet Commonly known as: LIPITOR Take 1 tablet (40 mg total) by mouth daily.   busPIRone 15 MG tablet Commonly known as: BUSPAR Take 1 tablet (15 mg total) by mouth at bedtime. Start with one half tablet nightly for one week, then increase to one whole tablet nightly   cephALEXin 500 MG capsule Commonly known as:  Keflex Take 1 capsule (500 mg total) by mouth 2 (two) times daily.   cyclobenzaprine 5 MG tablet Commonly known as: FLEXERIL Take 1 tablet (5 mg total) by mouth 2 (two) times daily as needed for muscle spasms (and back pain).   docusate sodium 100 MG capsule Commonly known as: COLACE Take 100 mg by mouth daily.   HYDROcodone-acetaminophen 5-325 MG tablet Commonly known as: Norco Take 2 tablets by mouth every 4 (four) hours as needed.   ipratropium 0.06 % nasal spray Commonly known as: Atrovent Place 2 sprays into both nostrils 4 (four) times daily.   levothyroxine 100 MCG tablet Commonly known as: SYNTHROID TAKE 1 TABLET(100 MCG) BY MOUTH DAILY What changed: See the new instructions.   metoprolol succinate 50 MG 24 hr tablet Commonly known as: TOPROL-XL TAKE 1 TABLET BY MOUTH EVERY DAY IMMEDIATELY FOLLOWING A MEAL What changed: See the new instructions.   multivitamin with minerals tablet Take 1 tablet by mouth daily.   olmesartan-hydrochlorothiazide 40-25 MG tablet Commonly known as: BENICAR HCT Take 1 tablet by mouth daily.   omeprazole 40 MG capsule Commonly known as: PRILOSEC TAKE ONE CAPSULE BY MOUTH DAILY What changed: when to take this   ondansetron 8 MG tablet Commonly known as: Zofran Take 1 tablet (8 mg total) by mouth every 8 (eight) hours as needed for nausea or vomiting.   predniSONE 10 MG (21) Tbpk tablet Commonly known as: STERAPRED UNI-PAK 21 TAB Take as directed   promethazine 25 MG tablet Commonly known as: PHENERGAN 1 tab every 8 hours as needed for nausea due to headache          Review of Systems  Hypertension, followed by her PCP  She has had a hysterectomy   Her hot flashes are getting somewhat better, again has not had any HRT         Examination:  There were no vitals taken for this visit.    Assessment:  HYPOTHYROIDISM, postsurgical  She was having a low normal TSH previously Now with 100 mcg of levothyroxine  for about 6 months her TSH is quite normal at 1.9 Not clear if she is having any interaction with taking her multivitamins at the same time before breakfast Also not clear if her multivitamin has significant amount of calcium or iron  Subjectively feels fairly good However has some fatigue related to insomnia  No history of goiter on exam when first seen in 09/2018  Her weight is reportedly about the same   HYPERCALCEMIA: Calcium levels are more normal consistently and likely related to HCTZ in the past No further evaluation needed   PLAN:  Continue levothyroxine 100 mcg daily She will try to take her multivitamin at lunch or dinner  Follow-up in 6 months   Zane Samson Dwyane Dee 09/12/2019, 9:15 PM     Note: This office note was prepared with Dragon voice recognition system technology. Any transcriptional errors that result from this process are unintentional.   Elayne Snare

## 2019-09-13 ENCOUNTER — Other Ambulatory Visit: Payer: Self-pay

## 2019-09-13 ENCOUNTER — Encounter: Payer: Self-pay | Admitting: Endocrinology

## 2019-09-13 ENCOUNTER — Ambulatory Visit (INDEPENDENT_AMBULATORY_CARE_PROVIDER_SITE_OTHER): Payer: Self-pay | Admitting: Endocrinology

## 2019-09-13 DIAGNOSIS — E890 Postprocedural hypothyroidism: Secondary | ICD-10-CM

## 2019-09-17 ENCOUNTER — Other Ambulatory Visit: Payer: Self-pay | Admitting: Internal Medicine

## 2019-09-19 ENCOUNTER — Telehealth (INDEPENDENT_AMBULATORY_CARE_PROVIDER_SITE_OTHER): Payer: PRIVATE HEALTH INSURANCE | Admitting: Family Medicine

## 2019-09-19 ENCOUNTER — Encounter: Payer: Self-pay | Admitting: Family Medicine

## 2019-09-19 ENCOUNTER — Other Ambulatory Visit: Payer: Self-pay

## 2019-09-19 ENCOUNTER — Encounter: Payer: Self-pay | Admitting: Internal Medicine

## 2019-09-19 DIAGNOSIS — R0981 Nasal congestion: Secondary | ICD-10-CM | POA: Diagnosis not present

## 2019-09-19 DIAGNOSIS — R300 Dysuria: Secondary | ICD-10-CM | POA: Diagnosis not present

## 2019-09-19 DIAGNOSIS — Z8616 Personal history of COVID-19: Secondary | ICD-10-CM | POA: Diagnosis not present

## 2019-09-19 MED ORDER — NITROFURANTOIN MONOHYD MACRO 100 MG PO CAPS: 100.0000 mg | ORAL_CAPSULE | Freq: Two times a day (BID) | ORAL | 0 refills | Status: DC

## 2019-09-19 NOTE — Progress Notes (Signed)
Virtual Visit via Video Note  I connected with Zaineb  on 09/19/19 at  4:20 PM EST by a video enabled telemedicine application and verified that I am speaking with the correct person using two identifiers.  Location patient: home Location provider:work or home office Persons participating in the virtual visit: patient, provider  I discussed the limitations of evaluation and management by telemedicine and the availability of in person appointments. The patient expressed understanding and agreed to proceed.   HPI:  Acute visit, pt is concerned for a "UTI": -symptoms started: 4-5 days -symptoms:cloudy urine, frequency, urgency, mild low back pain, mild dysuria -Denies: hematuria, flank pain, fevers, vomiting diarrhea, vaginal symptoms -s/p hysterectomy -history of UTIs in the past and this feels the same  Also reports has had some sinus issues the last 1 week: -runny nose, sinus congestion, sinus pressure, ear pressure -she had COVID19 last month then symptoms resolved -no fevers, SOB, severe HA, Vomiting, diarrhea, body aches -was told not to test for COVID again but someone told her this sounds like COVID  ROS: See pertinent positives and negatives per HPI.  Past Medical History:  Diagnosis Date  . Anxiety   . C1q nephropathy   . Cancer [facility])    history of thyroid cancer  . Chronic constipation   . CKD (chronic kidney disease), stage II nephrologist-  dr Gretchen Short [facility])   secondary to C1q nephropathy/  hypertension  . Family history of adverse reaction to anesthesia    father hard to put down for anesthesia  . GERD (gastroesophageal reflux disease)   . H/O radioactive iodine thyroid ablation    1992  right throid  . History of exercise intolerance    01-052011  ETT--  normal w/ no ischemia per cardiologist (dr Aundra Dubin) epic note  . History of pulmonary embolus (PE)    07/ 2008-- treated w/ coumadin  . History of syncope    recurrent --   workup done per epic -- dx orthostatic hypotension  . Hypertension   . IBS (irritable bowel syndrome)   . Pelvic pain in female   . 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 Proteinuria   . Seasonal and perennial allergic rhinitis   . Wears contact lenses     Past Surgical History:  Procedure Laterality Date  . [place] STUDY N/A 11/16/2016   Procedure: [place] STUDY;  Surgeon: Doran Stabler, MD;  Location: [facility];  Service: Gastroenterology;  Laterality: N/A;  . ABDOMINAL HYSTERECTOMY  1998  . CYSTOSCOPY N/A 04/28/2016   Procedure: CYSTOSCOPY;  Surgeon: Arvella Nigh, MD;  Location: [place] [facility];  Service: Gynecology;  Laterality: N/A;  . ESOPHAGEAL MANOMETRY N/A 11/16/2016   Procedure: ESOPHAGEAL MANOMETRY (EM);  Surgeon: Doran Stabler, MD;  [facility];  Service: Gastroenterology;  Laterality: N/A;  . ESOPHAGOGASTRODUODENOSCOPY    . LAPAROSCOPIC CHOLECYSTECTOMY  1997  . LAPAROSCOPY N/A 03/26/2016   Procedure: LAPAROSCOPY DIAGNOSTIC, LYSIS OF ADHESIONS;  Surgeon: Arvella Nigh, MD;  Location: [place];  Service: Gynecology;  Laterality: N/A;  . LAPAROTOMY N/A 04/28/2016   Procedure: EXPLORATORY LAPAROTOMY;  Surgeon: Arvella Nigh, MD;  Location: [place] [facility];  Service: Gynecology;  Laterality: N/A;  . RENAL BIOPSY    . SALPINGOOPHORECTOMY Bilateral 04/28/2016   Procedure: Bilateral OOPHORECTOMY;  Surgeon: Arvella Nigh, MD;  Location: [place] [facility];  Service: Gynecology;  Laterality: Bilateral;  . THYROIDECTOMY Left 1991  . TRANSTHORACIC ECHOCARDIOGRAM  04/27/2012  normal LV, ef 60-65%/  trivial MR  . TUBAL LIGATION  1992    Family History  Problem Relation Age of Onset  . Hypertension Father   . Coronary artery disease Father   . Diabetes Father   . Stroke Father   . Prostate cancer Father   . Diabetes Mother        Pre-diabetic  . Hypertension Mother   . Breast cancer Maternal Aunt   . Thyroid disease Maternal Aunt   . Colon cancer Neg  Hx   . Colon polyps Neg Hx   . Kidney disease Neg Hx   . Esophageal cancer Neg Hx   . Gallbladder disease Neg Hx     SOCIAL HX: see hpi   Current Outpatient Medications:  .  albuterol (PROVENTIL HFA;VENTOLIN HFA) 108 (90 Base) MCG/ACT inhaler, INHALE 2 PUFFS INTO THE LUNGS EVERY 6 HOURS AS NEEDED, Disp: 6.7 g, Rfl: 1 .  ALPRAZolam (XANAX) 0.25 MG tablet, Take 1 tablet (0.25 mg total) by mouth 2 (two) times daily as needed for anxiety., Disp: 60 tablet, Rfl: 0 .  atorvastatin (LIPITOR) 40 MG tablet, Take 1 tablet (40 mg total) by mouth daily., Disp: 90 tablet, Rfl: 1 .  docusate sodium (COLACE) 100 MG capsule, Take 100 mg by mouth daily., Disp: , Rfl:  .  ipratropium (ATROVENT) 0.06 % nasal spray, Place 2 sprays into both nostrils 4 (four) times daily., Disp: 15 mL, Rfl: 0 .  levothyroxine (SYNTHROID) 100 MCG tablet, TAKE 1 TABLET(100 MCG) BY MOUTH DAILY (Patient taking differently: Take 100 mcg by mouth daily before breakfast. ), Disp: 30 tablet, Rfl: 3 .  metoprolol succinate (TOPROL-XL) 50 MG 24 hr tablet, TAKE 1 TABLET BY MOUTH EVERY DAY IMMEDIATELY FOLLOWING A MEAL, Disp: 90 tablet, Rfl: 1 .  Multiple Vitamins-Minerals (MULTIVITAMIN WITH MINERALS) tablet, Take 1 tablet by mouth daily., Disp: , Rfl:  .  olmesartan-hydrochlorothiazide (BENICAR HCT) 40-25 MG tablet, Take 1 tablet by mouth daily., Disp: 90 tablet, Rfl: 3 .  omeprazole (PRILOSEC) 40 MG capsule, TAKE ONE CAPSULE BY MOUTH DAILY (Patient taking differently: Take 40 mg by mouth. ), Disp: 90 capsule, Rfl: 1 .  ondansetron (ZOFRAN) 8 MG tablet, Take 1 tablet (8 mg total) by mouth every 8 (eight) hours as needed for nausea or vomiting., Disp: 30 tablet, Rfl: 0 .  nitrofurantoin, macrocrystal-monohydrate, (MACROBID) 100 MG capsule, Take 1 capsule (100 mg total) by mouth 2 (two) times daily., Disp: 14 capsule, Rfl: 0  EXAM:  VITALS per patient if applicable:denies fever  GENERAL: alert, oriented, appears well and in no acute  distress  HEENT: atraumatic, conjunttiva clear, no obvious abnormalities on inspection of external nose and ears  NECK: normal movements of the head and neck  LUNGS: on inspection no signs of respiratory distress, breathing rate appears normal, no obvious gross SOB, gasping or wheezing  CV: no obvious cyanosis  MS: moves all visible extremities without noticeable abnormality  PSYCH/NEURO: pleasant and cooperative, no obvious depression or anxiety, speech and thought processing grossly intact  ASSESSMENT AND PLAN:  Discussed the following assessment and plan:  Dysuria  Sinus congestion  History of COVID-19  -we discussed possible serious and likely etiologies, options for evaluation and workup, limitations of telemedicine visit vs in person visit, treatment, treatment risks and precautions. Pt prefers to treat via telemedicine empirically rather then risking or undertaking an in person visit at this moment. For the dysuria she opted for empiric treatment for possible UTI with Macrobid 100mg  bid  x 7 days, sent to pharmacy. For the URI symptoms discussed could be allergies, VURI, mild influenza or less likely but I guess possible mild new variant of COVID19 as we are not sure how these new strains will impact our previously ill patients. She has opted for trial symptomatic home care and notifying her employer with prompt re-evaluation or inperson if worsening or not improving. Discussed limitations of COVID19 testing - especially with recent COVID19 infection. Warned of late complications of 0000000. Patient agrees to seek prompt in person care if worsening, new symptoms arise, or if is not improving with treatment.   I discussed the assessment and treatment plan with the patient. The patient was provided an opportunity to ask questions and all were answered. The patient agreed with the plan and demonstrated an understanding of the instructions.   The patient was advised to call back or  seek an in-person evaluation if the symptoms worsen or if the condition fails to improve as anticipated.   Lucretia Kern, DO

## 2019-09-20 ENCOUNTER — Telehealth: Payer: Self-pay | Admitting: Internal Medicine

## 2019-09-20 NOTE — Telephone Encounter (Signed)
Pt was prescribed Macrobid for her UTI. Pt read that people with Kidney disease should use caution before taking it. Pt wants to make sure she an take it due to her history of Kidney disease before she picks it up from the pharmacy  Pt can be reached at [phone]

## 2019-09-21 MED ORDER — CEPHALEXIN 500 MG PO CAPS: 500.0000 mg | ORAL_CAPSULE | Freq: Two times a day (BID) | ORAL | 0 refills | Status: AC

## 2019-09-21 NOTE — Telephone Encounter (Signed)
Tell her thank you for her message. Caution is advised with many drugs if kidney disease. Most folks still do ok with this dose short term- but just to be sure perhaps we can change to another? Could do keflex 500mg  bid x 5 days. This is the adjusted dose for her kidney function from last labs. Ok to send if she would like. Thanks!

## 2019-09-21 NOTE — Telephone Encounter (Signed)
Patient is aware and would like to switch medications.  Rx sent.

## 2019-09-21 NOTE — Telephone Encounter (Signed)
Left message on machine for patient returning her call 

## 2019-09-21 NOTE — Telephone Encounter (Signed)
Left message on machine returning patient's call. Okay to leave a message?

## 2019-10-19 ENCOUNTER — Ambulatory Visit: Payer: No Typology Code available for payment source | Admitting: Internal Medicine

## 2019-10-30 ENCOUNTER — Other Ambulatory Visit: Payer: Self-pay

## 2019-10-30 ENCOUNTER — Encounter (HOSPITAL_COMMUNITY): Payer: Self-pay

## 2019-10-30 ENCOUNTER — Ambulatory Visit (HOSPITAL_COMMUNITY)
Admission: EM | Admit: 2019-10-30 | Discharge: 2019-10-30 | Disposition: A | Discharge status: Home / Self Care | Payer: PRIVATE HEALTH INSURANCE | Attending: Family Medicine | Admitting: Family Medicine

## 2019-10-30 ENCOUNTER — Ambulatory Visit (INDEPENDENT_AMBULATORY_CARE_PROVIDER_SITE_OTHER): Payer: PRIVATE HEALTH INSURANCE

## 2019-10-30 DIAGNOSIS — R079 Chest pain, unspecified: Secondary | ICD-10-CM | POA: Diagnosis not present

## 2019-10-30 DIAGNOSIS — R0789 Other chest pain: Secondary | ICD-10-CM

## 2019-10-30 DIAGNOSIS — M546 Pain in thoracic spine: Secondary | ICD-10-CM

## 2019-10-30 NOTE — ED Triage Notes (Signed)
Pt is here with chest ache that comes & goes, left arm, left shoulder pain that started Saturday.

## 2019-10-30 NOTE — ED Provider Notes (Signed)
[facility]    CSN: [id number] Arrival date & time: 10/30/19  1659      History   Chief Complaint Chief Complaint  Patient presents with  . body pain    HPI Anaeli Kempen is a 49 y.o. female.   Lyla Skora presents with complaints of symptoms which started two days ago. Intermittent chest pain, sometimes is sharp to center of chest. Upper chest and bilateral shoulder pain which radiates to left arm. Sensation of a "weight" to upper back. No shortness of breath . No difficulty breathing. No pain with breathing. Currently feels pain to chest and to back. Some left arm pain currently. Nothing specifically worsens symptoms. No fevers. No cough. Mild runny nose. Some aching to bilateral legs, anterior shins. No calf pain. Hasn't taken any medications for symptoms. Denies any previous similar. Mother in law just diagnosed with cancer so has had increased stress. Can sleep well, pain doesn't wake her up, laying flat doesn't worsen pain. Eating doesn't worsen pain. occasional nausea, no vomiting. No personal history of cardiac history. Her father has had two mi's. History of PE, and gerd. History of CKD.    ROS per HPI, negative if not otherwise mentioned.      Past Medical History:  Diagnosis Date  . Anxiety   . C1q nephropathy   . Cancer [facility])    history of thyroid cancer  . Chronic constipation   . CKD (chronic kidney disease), stage II nephrologist-  dr Gretchen Short [facility])   secondary to C1q nephropathy/  hypertension  . Family history of adverse reaction to anesthesia    father hard to put down for anesthesia  . GERD (gastroesophageal reflux disease)   . H/O radioactive iodine thyroid ablation    1992  right throid  . History of exercise intolerance    01-052011  ETT--  normal w/ no ischemia per cardiologist (dr Aundra Dubin) epic note  . History of pulmonary embolus (PE)    07/ 2008-- treated w/ coumadin  . History of  syncope    recurrent --  workup done per epic -- dx orthostatic hypotension  . Hypertension   . IBS (irritable bowel syndrome)   . Pelvic pain in female   . 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 Proteinuria   . Seasonal and perennial allergic rhinitis   . Wears contact lenses     Patient Active Problem List   Diagnosis Date Noted  . Vitamin D deficiency 05/19/2019  . IGT (impaired glucose tolerance) 05/19/2019  . Exposure to medical diagnostic radiation 02/09/2017  . S/P bilateral salpingo-oophorectomy 04/28/2016  . Pelvic adhesions 03/26/2016    Class: Present on Admission  . History of pulmonary embolism 08/01/2014  . CKD (chronic kidney disease) stage 3, GFR 30-59 ml/min (HCC) 02/27/2014  . Hypothyroidism 04/28/2012  . Hemorrhage of rectum and anus 02/09/2011  . Constipation 02/09/2011  . Dyslipidemia 08/26/2009  . Backache 02/14/2008  . Allergic rhinitis 11/15/2007  . GERD 06/07/2007  . Anxiety state 04/27/2007  . Essential hypertension 04/27/2007  . Disorder of kidney and ureter 03/17/2007  . EMBOLISM/THROMBOSIS, DEEP VSL PRXML LWR EXTRM 03/16/2007    Past Surgical History:  Procedure Laterality Date  . [place] STUDY N/A 11/16/2016   Procedure: [place] STUDY;  Surgeon: Doran Stabler, MD;  [facility];  Service: Gastroenterology;  Laterality: N/A;  . ABDOMINAL HYSTERECTOMY  1998  . CYSTOSCOPY N/A 04/28/2016   Procedure: CYSTOSCOPY;  Surgeon: Arvella Nigh, MD;  Location: [facility];  Service: Gynecology;  Laterality: N/A;  . ESOPHAGEAL MANOMETRY N/A 11/16/2016   Procedure: ESOPHAGEAL MANOMETRY (EM);  Surgeon: Doran Stabler, MD;  [facility];  Service: Gastroenterology;  Laterality: N/A;  . ESOPHAGOGASTRODUODENOSCOPY    . LAPAROSCOPIC CHOLECYSTECTOMY  1997  . LAPAROSCOPY N/A 03/26/2016   Procedure: LAPAROSCOPY DIAGNOSTIC, LYSIS OF ADHESIONS;  Surgeon: Arvella Nigh, MD;  Location: [place];  Service:  Gynecology;  Laterality: N/A;  . LAPAROTOMY N/A 04/28/2016   Procedure: EXPLORATORY LAPAROTOMY;  Surgeon: Arvella Nigh, MD;  Location: [facility];  Service: Gynecology;  Laterality: N/A;  . RENAL BIOPSY    . SALPINGOOPHORECTOMY Bilateral 04/28/2016   Procedure: Bilateral OOPHORECTOMY;  Surgeon: Arvella Nigh, MD;  Location: [facility] [facility];  Service: Gynecology;  Laterality: Bilateral;  . THYROIDECTOMY Left 1991  . TRANSTHORACIC ECHOCARDIOGRAM  04/27/2012   normal LV, ef 60-65%/  trivial MR  . TUBAL LIGATION  1992    OB History   No obstetric history on file.      Home Medications    Prior to Admission medications   Medication Sig Start Date End Date Taking? Authorizing Provider  albuterol (PROVENTIL HFA;VENTOLIN HFA) 108 (90 Base) MCG/ACT inhaler INHALE 2 PUFFS INTO THE LUNGS EVERY 6 HOURS AS NEEDED 08/01/18   Lucretia Kern, DO  ALPRAZolam Duanne Moron) 0.25 MG tablet Take 1 tablet (0.25 mg total) by mouth 2 (two) times daily as needed for anxiety. 11/30/16   Marletta Lor, MD  atorvastatin (LIPITOR) 40 MG tablet Take 1 tablet (40 mg total) by mouth daily. 05/19/19   Isaac Bliss, Rayford Halsted, MD  docusate sodium (COLACE) 100 MG capsule Take 100 mg by mouth daily.    [provider]  ipratropium (ATROVENT) 0.06 % nasal spray Place 2 sprays into both nostrils 4 (four) times daily. 08/02/18   Vivi Barrack, MD  levothyroxine (SYNTHROID) 100 MCG tablet TAKE 1 TABLET(100 MCG) BY MOUTH DAILY Patient taking differently: Take 100 mcg by mouth daily before breakfast.  07/30/19   Elayne Snare, MD  metoprolol succinate (TOPROL-XL) 50 MG 24 hr tablet TAKE 1 TABLET BY MOUTH EVERY DAY IMMEDIATELY FOLLOWING A MEAL 09/19/19   Isaac Bliss, Rayford Halsted, MD  Multiple Vitamins-Minerals (MULTIVITAMIN WITH MINERALS) tablet Take 1 tablet by mouth daily.    [provider]  nitrofurantoin, macrocrystal-monohydrate, (MACROBID) 100 MG capsule Take 1 capsule (100 mg total) by mouth 2 (two) times daily. 09/19/19    Lucretia Kern, DO  olmesartan-hydrochlorothiazide (BENICAR HCT) 40-25 MG tablet Take 1 tablet by mouth daily. 12/08/17   Marletta Lor, MD  omeprazole (PRILOSEC) 40 MG capsule TAKE ONE CAPSULE BY MOUTH DAILY Patient taking differently: Take 40 mg by mouth.  08/24/19   Isaac Bliss, Rayford Halsted, MD  ondansetron (ZOFRAN) 8 MG tablet Take 1 tablet (8 mg total) by mouth every 8 (eight) hours as needed for nausea or vomiting. 05/22/19   Doran Stabler, MD    Family History Family History  Problem Relation Age of Onset  . Hypertension Father   . Coronary artery disease Father   . Diabetes Father   . Stroke Father   . Prostate cancer Father   . Diabetes Mother        Pre-diabetic  . Hypertension Mother   . Breast cancer Maternal Aunt   . Thyroid disease Maternal Aunt   . Colon cancer Neg Hx   . Colon polyps Neg Hx   .  Kidney disease Neg Hx   . Esophageal cancer Neg Hx   . Gallbladder disease Neg Hx     Social History Social History   Tobacco Use  . Smoking status: Never Smoker  . Smokeless tobacco: Never Used  Substance Use Topics  . Alcohol use: No    Alcohol/week: 0.0 standard drinks  . Drug use: No     Allergies   Aspirin, Flagyl [metronidazole], Food, Tramadol, and Dilaudid [hydromorphone]   Review of Systems Review of Systems   Physical Exam Triage Vital Signs ED Triage Vitals  Enc Vitals Group     BP 10/30/19 1800 131/85     Pulse Rate 10/30/19 1800 64     Resp 10/30/19 1800 19     Temp 10/30/19 1800 98.2 F (36.8 C)     Temp Source 10/30/19 1800 Oral     SpO2 10/30/19 1800 99 %     Weight 10/30/19 1759 224 lb (101.6 kg)     Height --      Head Circumference --      Peak Flow --      Pain Score 10/30/19 1758 5     Pain Loc --      Pain Edu? --      Excl. in [place]? --    No data found.  Updated Vital Signs BP 131/85 (BP Location: Right Arm)   Pulse 64   Temp 98.2 F (36.8 C) (Oral)   Resp 19   Wt 224 lb (101.6 kg)   SpO2 99%   BMI  36.15 kg/m    Physical Exam Constitutional:      General: She is not in acute distress.    Appearance: She is well-developed.  Cardiovascular:     Rate and Rhythm: Normal rate and regular rhythm.  Pulmonary:     Effort: Pulmonary effort is normal.     Breath sounds: Normal breath sounds.     Comments: Patient notes increased thoracic back pain with expiration Chest:     Chest wall: No tenderness.  Musculoskeletal:     Thoracic back: No tenderness or bony tenderness.  Skin:    General: Skin is warm and dry.  Neurological:     Mental Status: She is alert and oriented to person, place, and time.    EKG:  NSR rate of 65 . Previous EKG was available for review. No stwave changes as interpreted by me.  Reviewed with supervising physician Dr. Lanny Cramp    UC Treatments / Results  Labs (all labs ordered are listed, but only abnormal results are displayed) Labs Reviewed - No data to display  EKG   Radiology DG Chest 2 View  Result Date: 10/30/2019 CLINICAL DATA:  Chest pain EXAM: CHEST - 2 VIEW COMPARISON:  07/07/2019 FINDINGS: The heart size and mediastinal contours are within normal limits. Both lungs are clear. The visualized skeletal structures are unremarkable. Surgical clips at the thoracic inlet. IMPRESSION: No active cardiopulmonary disease. Electronically Signed   By: Donavan Foil M.D.   On: 10/30/2019 19:15    Procedures Procedures (including critical care time)  Medications Ordered in UC Medications - No data to display  Initial Impression / Assessment and Plan / UC Course  I have reviewed the triage vital signs and the nursing notes.  Pertinent labs & imaging results that were available during my care of the patient were reviewed by me and considered in my medical decision making (see chart for details).     Sternal and  proximal chest pain, upper back pain, left arm pain. Three days. No acute findings on ekg or chest xray. History of PE, as well as htn. Feel  that it is safest for more thorough cardiac evaluation with troponin at this time, still with active chest pain. Stress may also be contributing, unfortunately. Patient provided wheelchair assist per nursing staff to ER. Patient verbalized understanding and agreeable to plan.   Final Clinical Impressions(s) / UC Diagnoses   Final diagnoses:  Chest pain, unspecified type  Acute thoracic back pain, unspecified back pain laterality     Discharge Instructions     I don't have an obvious source of your pain currently.  It would be safest to ensure this is not related to your heart, therefore I would recommend that you go to the ER for further monitoring and testing.     ED Prescriptions    None     PDMP not reviewed this encounter.   Zigmund Gottron, NP 10/30/19 1933

## 2019-10-30 NOTE — Discharge Instructions (Signed)
I don't have an obvious source of your pain currently.  It would be safest to ensure this is not related to your heart, therefore I would recommend that you go to the ER for further monitoring and testing.

## 2019-11-16 ENCOUNTER — Other Ambulatory Visit: Payer: Self-pay | Admitting: Endocrinology

## 2019-11-16 ENCOUNTER — Other Ambulatory Visit: Payer: Self-pay | Admitting: Internal Medicine

## 2019-11-16 DIAGNOSIS — E785 Hyperlipidemia, unspecified: Secondary | ICD-10-CM

## 2019-11-23 ENCOUNTER — Encounter: Payer: Self-pay | Admitting: Internal Medicine

## 2019-11-23 MED ORDER — ACYCLOVIR 5 % EX CREA: 1.0000 "application " | TOPICAL_CREAM | CUTANEOUS | 0 refills | Status: DC

## 2019-12-13 ENCOUNTER — Other Ambulatory Visit: Payer: Self-pay

## 2019-12-14 ENCOUNTER — Ambulatory Visit (INDEPENDENT_AMBULATORY_CARE_PROVIDER_SITE_OTHER): Payer: PRIVATE HEALTH INSURANCE | Admitting: Internal Medicine

## 2019-12-14 ENCOUNTER — Encounter: Payer: Self-pay | Admitting: Internal Medicine

## 2019-12-14 VITALS — BP 120/68 | HR 85 | Temp 97.8°F | Wt 225.5 lb

## 2019-12-14 DIAGNOSIS — N183 Chronic kidney disease, stage 3 unspecified: Secondary | ICD-10-CM

## 2019-12-14 DIAGNOSIS — E785 Hyperlipidemia, unspecified: Secondary | ICD-10-CM

## 2019-12-14 DIAGNOSIS — E559 Vitamin D deficiency, unspecified: Secondary | ICD-10-CM

## 2019-12-14 DIAGNOSIS — I10 Essential (primary) hypertension: Secondary | ICD-10-CM

## 2019-12-14 DIAGNOSIS — R739 Hyperglycemia, unspecified: Secondary | ICD-10-CM

## 2019-12-14 DIAGNOSIS — R7302 Impaired glucose tolerance (oral): Secondary | ICD-10-CM | POA: Diagnosis not present

## 2019-12-14 DIAGNOSIS — E890 Postprocedural hypothyroidism: Secondary | ICD-10-CM

## 2019-12-14 LAB — POCT GLYCOSYLATED HEMOGLOBIN (HGB A1C): Hemoglobin A1C: 5.9 % — AB (ref 4.0–5.6)

## 2019-12-14 NOTE — Patient Instructions (Signed)
-  Nice seeing you today!!  -Schedule a 3 month lab visit.  -Schedule a 6 month follow up with me.  -Make sure you take the vitamin D once a week.

## 2019-12-14 NOTE — Progress Notes (Signed)
Established Patient Office Visit     This visit occurred during the SARS-CoV-2 public health emergency.  Safety protocols were in place, including screening questions prior to the visit, additional usage of staff PPE, and extensive cleaning of exam room while observing appropriate contact time as indicated for disinfecting solutions.    CC/Reason for Visit: Follow-up chronic medical conditions  HPI: Rhonda [name] is a 49 y.o. female who is coming in today for the above mentioned reasons. Past Medical History is significant for: Hypothyroidism followed by endocrinology,  HTN:  she remains on metoprolol at bedtime and olmesartan/HCTZ in th am, Norvasc was discontinued by nephrology due to persistent hypotension, H/o GERD who is very symptomatic when she does not take her PPI. She had DVT/PE in 2008 and completed 6 months of anticoagulation therapy. She also has a h/o CKD Stage III and follows with Dr. Moshe Cipro. She has no acute complaints today.  During her physical exam 6 months ago she was diagnosed with vitamin D deficiency, hypercholesterolemia and impaired glucose tolerance.  She is here today for follow-up of these issues.  Since I last saw her she did end up testing Covid positive beginning of January.  She has not had both of her Covid vaccines.  She has recovered well and has no complaints.   Past Medical/Surgical History: Past Medical History:  Diagnosis Date  . Anxiety   . C1q nephropathy   . Cancer [facility])    history of thyroid cancer  . Chronic constipation   . CKD (chronic kidney disease), stage II nephrologist-  dr Gretchen Short [facility])   secondary to C1q nephropathy/  hypertension  . Family history of adverse reaction to anesthesia    father hard to put down for anesthesia  . GERD (gastroesophageal reflux disease)   . H/O radioactive iodine thyroid ablation    1992  right throid  . History of exercise intolerance    01-052011   ETT--  normal w/ no ischemia per cardiologist (dr Aundra Dubin) epic note  . History of pulmonary embolus (PE)    07/ 2008-- treated w/ coumadin  . History of syncope    recurrent --  workup done per epic -- dx orthostatic hypotension  . Hypertension   . IBS (irritable bowel syndrome)   . Pelvic pain in female   . 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 Proteinuria   . Seasonal and perennial allergic rhinitis   . Wears contact lenses     Past Surgical History:  Procedure Laterality Date  . [place] STUDY N/A 11/16/2016   Procedure: [place] STUDY;  Surgeon: Doran Stabler, MD;  Location: [facility];  Service: Gastroenterology;  Laterality: N/A;  . ABDOMINAL HYSTERECTOMY  1998  . CYSTOSCOPY N/A 04/28/2016   Procedure: CYSTOSCOPY;  Surgeon: Arvella Nigh, MD;  Location: [place] [facility];  Service: Gynecology;  Laterality: N/A;  . ESOPHAGEAL MANOMETRY N/A 11/16/2016   Procedure: ESOPHAGEAL MANOMETRY (EM);  Surgeon: Doran Stabler, MD;  [facility];  Service: Gastroenterology;  Laterality: N/A;  . ESOPHAGOGASTRODUODENOSCOPY    . LAPAROSCOPIC CHOLECYSTECTOMY  1997  . LAPAROSCOPY N/A 03/26/2016   Procedure: LAPAROSCOPY DIAGNOSTIC, LYSIS OF ADHESIONS;  Surgeon: Arvella Nigh, MD;  Location: [place];  Service: Gynecology;  Laterality: N/A;  . LAPAROTOMY N/A 04/28/2016   Procedure: EXPLORATORY LAPAROTOMY;  Surgeon: Arvella Nigh, MD;  Location: [place] [facility];  Service: Gynecology;  Laterality: N/A;  . RENAL BIOPSY    .  SALPINGOOPHORECTOMY Bilateral 04/28/2016   Procedure: Bilateral OOPHORECTOMY;  Surgeon: Arvella Nigh, MD;  Location: [facility];  Service: Gynecology;  Laterality: Bilateral;  . THYROIDECTOMY Left 1991  . TRANSTHORACIC ECHOCARDIOGRAM  04/27/2012   normal LV, ef 60-65%/  trivial MR  . TUBAL LIGATION  1992    Social History:  reports that she has never smoked. She has never used smokeless tobacco. She reports that she does not drink alcohol or use  drugs.  Allergies: Allergies  Allergen Reactions  . Aspirin Hives  . Flagyl [Metronidazole] Nausea And Vomiting    With oral flagyl.   Has tolerated vaginal flagyl.    . Food Other (See Comments)    Lima beans--  Positive allergy test   . Tramadol Other (See Comments)    Headache   . Dilaudid [Hydromorphone] Itching    Family History:  Family History  Problem Relation Age of Onset  . Hypertension Father   . Coronary artery disease Father   . Diabetes Father   . Stroke Father   . Prostate cancer Father   . Diabetes Mother        Pre-diabetic  . Hypertension Mother   . Breast cancer Maternal Aunt   . Thyroid disease Maternal Aunt   . Colon cancer Neg Hx   . Colon polyps Neg Hx   . Kidney disease Neg Hx   . Esophageal cancer Neg Hx   . Gallbladder disease Neg Hx      Current Outpatient Medications:  .  acyclovir cream (ZOVIRAX) 5 %, Apply 1 application topically every 3 (three) hours., Disp: 15 g, Rfl: 0 .  albuterol (PROVENTIL HFA;VENTOLIN HFA) 108 (90 Base) MCG/ACT inhaler, INHALE 2 PUFFS INTO THE LUNGS EVERY 6 HOURS AS NEEDED, Disp: 6.7 g, Rfl: 1 .  ALPRAZolam (XANAX) 0.25 MG tablet, Take 1 tablet (0.25 mg total) by mouth 2 (two) times daily as needed for anxiety., Disp: 60 tablet, Rfl: 0 .  atorvastatin (LIPITOR) 40 MG tablet, TAKE 1 TABLET(40 MG) BY MOUTH DAILY, Disp: 90 tablet, Rfl: 1 .  docusate sodium (COLACE) 100 MG capsule, Take 100 mg by mouth daily., Disp: , Rfl:  .  levothyroxine (SYNTHROID) 100 MCG tablet, Take 1 tablet (100 mcg total) by mouth daily before breakfast., Disp: 30 tablet, Rfl: 0 .  metoprolol succinate (TOPROL-XL) 50 MG 24 hr tablet, TAKE 1 TABLET BY MOUTH EVERY DAY IMMEDIATELY FOLLOWING A MEAL, Disp: 90 tablet, Rfl: 1 .  Multiple Vitamins-Minerals (MULTIVITAMIN WITH MINERALS) tablet, Take 1 tablet by mouth daily., Disp: , Rfl:  .  olmesartan-hydrochlorothiazide (BENICAR HCT) 40-25 MG tablet, Take 1 tablet by mouth daily., Disp: 90 tablet, Rfl:  3 .  omeprazole (PRILOSEC) 40 MG capsule, TAKE ONE CAPSULE BY MOUTH DAILY (Patient taking differently: Take 40 mg by mouth. ), Disp: 90 capsule, Rfl: 1 .  ondansetron (ZOFRAN) 8 MG tablet, Take 1 tablet (8 mg total) by mouth every 8 (eight) hours as needed for nausea or vomiting., Disp: 30 tablet, Rfl: 0  Review of Systems:  Constitutional: Denies fever, chills, diaphoresis, appetite change and fatigue.  HEENT: Denies photophobia, eye pain, redness, hearing loss, ear pain, congestion, sore throat, rhinorrhea, sneezing, mouth sores, trouble swallowing, neck pain, neck stiffness and tinnitus.   Respiratory: Denies SOB, DOE, cough, chest tightness,  and wheezing.   Cardiovascular: Denies chest pain, palpitations and leg swelling.  Gastrointestinal: Denies nausea, vomiting, abdominal pain, diarrhea, constipation, blood in stool and abdominal distention.  Genitourinary: Denies dysuria, urgency, frequency, hematuria, flank pain  and difficulty urinating.  Endocrine: Denies: hot or cold intolerance, sweats, changes in hair or nails, polyuria, polydipsia. Musculoskeletal: Denies myalgias, back pain, joint swelling, arthralgias and gait problem.  Skin: Denies pallor, rash and wound.  Neurological: Denies dizziness, seizures, syncope, weakness, light-headedness, numbness and headaches.  Hematological: Denies adenopathy. Easy bruising, personal or family bleeding history  Psychiatric/Behavioral: Denies suicidal ideation, mood changes, confusion, nervousness, sleep disturbance and agitation    Physical Exam: Vitals:   12/14/19 1511  BP: 120/68  Pulse: 85  Temp: 97.8 F (36.6 C)  TempSrc: Temporal  SpO2: 95%  Weight: 225 lb 8 oz (102.3 kg)    Body mass index is 36.4 kg/m.   Constitutional: NAD, calm, comfortable Eyes: PERRL, lids and conjunctivae normal ENMT: Mucous membranes are moist.  Respiratory: clear to auscultation bilaterally, no wheezing, no crackles. Normal respiratory effort. No  accessory muscle use.  Cardiovascular: Regular rate and rhythm, no murmurs / rubs / gallops. No extremity edema.  Neurologic: Grossly intact and nonfocal Psychiatric: Normal judgment and insight. Alert and oriented x 3. Normal mood.    Impression and Plan:  Vitamin D deficiency -She did not take vitamin D as prescribed, she will start taking weekly high-dose supplementation return in 3 weeks for follow-up.  IGT (impaired glucose tolerance) -A1c was 6.4 in October, down to 5.9 today.  Stage 3 chronic kidney disease, unspecified whether stage 3a or 3b CKD -Baseline creatinine around 1.4, followed by nephrology.  Postoperative hypothyroidism -On Synthroid, last TSH was 1.920, followed by endocrinology.  Dyslipidemia -She is on Lipitor 40 mg that started 6 months ago, however will hold on rechecking lipids today as she is not fasting.  Essential hypertension -Well-controlled.    Patient Instructions  -Nice seeing you today!!  -Schedule a 3 month lab visit.  -Schedule a 6 month follow up with me.  -Make sure you take the vitamin D once a week.     Lelon Frohlich, MD  Primary Care at [facility]

## 2019-12-16 ENCOUNTER — Other Ambulatory Visit: Payer: Self-pay | Admitting: Endocrinology

## 2019-12-19 ENCOUNTER — Other Ambulatory Visit: Payer: Self-pay | Admitting: Internal Medicine

## 2019-12-19 ENCOUNTER — Encounter: Payer: Self-pay | Admitting: Internal Medicine

## 2019-12-19 DIAGNOSIS — F411 Generalized anxiety disorder: Secondary | ICD-10-CM

## 2019-12-19 MED ORDER — ALPRAZOLAM 0.25 MG PO TABS: 0.2500 mg | ORAL_TABLET | Freq: Two times a day (BID) | ORAL | 0 refills | Status: DC | PRN

## 2019-12-19 MED ORDER — ALPRAZOLAM 0.25 MG PO TABS: 0.2500 mg | ORAL_TABLET | Freq: Two times a day (BID) | ORAL | 0 refills | Status: AC | PRN

## 2020-01-29 ENCOUNTER — Encounter: Payer: Self-pay | Admitting: Internal Medicine

## 2020-02-16 ENCOUNTER — Other Ambulatory Visit: Payer: Self-pay

## 2020-02-16 ENCOUNTER — Other Ambulatory Visit (INDEPENDENT_AMBULATORY_CARE_PROVIDER_SITE_OTHER): Payer: PRIVATE HEALTH INSURANCE

## 2020-02-16 DIAGNOSIS — E890 Postprocedural hypothyroidism: Secondary | ICD-10-CM | POA: Diagnosis not present

## 2020-02-16 LAB — TSH: TSH: 0.82 u[IU]/mL (ref 0.35–4.50)

## 2020-02-16 LAB — T4, FREE: Free T4: 1.1 ng/dL (ref 0.60–1.60)

## 2020-02-20 ENCOUNTER — Other Ambulatory Visit: Payer: Self-pay

## 2020-02-20 ENCOUNTER — Ambulatory Visit: Payer: PRIVATE HEALTH INSURANCE | Admitting: Endocrinology

## 2020-02-20 ENCOUNTER — Encounter: Payer: Self-pay | Admitting: Endocrinology

## 2020-02-20 VITALS — BP 118/68 | HR 81 | Ht 66.0 in | Wt 223.0 lb

## 2020-02-20 DIAGNOSIS — E890 Postprocedural hypothyroidism: Secondary | ICD-10-CM

## 2020-02-20 NOTE — Progress Notes (Signed)
Patient ID: Rhonda [name], female   DOB: 1971/01/24, 49 y.o.   MRN: [id number]             Reason for Appointment:  Hypothyroidism, follow-up visit    History of Present Illness:   Hypothyroidism was first diagnosed in 1991  Hypothyroidism apparently was secondary to surgery on a goiter and no details are available She was told that after the surgery there was a focus of cancer found in her thyroid but again no details available She subsequently had radioactive iodine treatment         The patient has been treated with variable doses of levothyroxine  At her initial consultation she had been on a stable dose of 150 mcg levothyroxine and was feeling fairly good with this    However subsequently since 05/2018 despite no new symptoms at that time her TSH was 0.01 No prior levels of TSH are available on her record since 2016  RECENT HISTORY:  Her levothyroxine dose had been adjusted periodically in 2019 and also in 2020 She is now taking 100 mcg daily as a stable dose  She was told to reduce the dose in 03/2019 and subsequent TSH was not as low  She does not think she has any unusual fatigue except when she is overworked No hair loss or significant weight change  The patient takes the thyroid supplement about an hour before breakfast and is taking it regularly with water  She also takes her other medications and Prilosec at the same time Previously also taking a chewable multivitamin at the same time and is not taking this         Her TSH which was low normal previously is now back up to 1.9  Patient's weight history is as follows:  Wt Readings from Last 3 Encounters:  02/20/20 223 lb (101.2 kg)  12/14/19 225 lb 8 oz (102.3 kg)  10/30/19 224 lb (101.6 kg)   Thyroid function results have been as follows:  Lab Results  Component Value Date   TSH 0.82 02/16/2020   TSH 1.92 09/08/2019   TSH 0.78 05/19/2019   TSH 0.36 04/03/2019   FREET4 1.10 02/16/2020   FREET4  0.97 09/08/2019   FREET4 1.05 04/03/2019   FREET4 0.89 11/29/2018   T3FREE 3.1 09/08/2019   T3FREE 3.7 09/27/2018   T3FREE 3.2 09/02/2018   HYPERCALCEMIA:  She had a high calcium in October 2019 and 10/20 However review of her medications indicates that she was previously not on HCTZ and has been on 25 mg HCTZ since middle of 2019  PTH level done from [place] lab was high but was normal from [place] in 2/20  Subsequently calcium levels have not been high and on the last 2 measurements back to normal  Lab Results  Component Value Date   CALCIUM 9.8 08/30/2019   CALCIUM 9.7 07/07/2019   CALCIUM 10.7 (H) 05/19/2019   CALCIUM 10.4 11/29/2018   CALCIUM 10.3 09/27/2018   CALCIUM 10.0 08/05/2018   CALCIUM 10.6 (H) 05/31/2018   Lab Results  Component Value Date   PTH 32 09/27/2018   CALCIUM 9.8 08/30/2019   CAION 5.40 08/05/2018     Past Medical History:  Diagnosis Date  . Anxiety   . C1q nephropathy   . Cancer [facility])    history of thyroid cancer  . Chronic constipation   . CKD (chronic kidney disease), stage II nephrologist-  dr Gretchen Short [facility])   secondary to C1q nephropathy/  hypertension  . Family history of adverse reaction to anesthesia    father hard to put down for anesthesia  . GERD (gastroesophageal reflux disease)   . H/O radioactive iodine thyroid ablation    1992  right throid  . History of exercise intolerance    01-052011  ETT--  normal w/ no ischemia per cardiologist (dr Aundra Dubin) epic note  . History of pulmonary embolus (PE)    07/ 2008-- treated w/ coumadin  . History of syncope    recurrent --  workup done per epic -- dx orthostatic hypotension  . Hypertension   . IBS (irritable bowel syndrome)   . Pelvic pain in female   . 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 Proteinuria   . Seasonal and perennial allergic rhinitis   . Wears contact lenses     Past Surgical History:  Procedure Laterality Date    . [place] STUDY N/A 11/16/2016   Procedure: [place] STUDY;  Surgeon: Doran Stabler, MD;  [facility];  Service: Gastroenterology;  Laterality: N/A;  . ABDOMINAL HYSTERECTOMY  1998  . CYSTOSCOPY N/A 04/28/2016   Procedure: CYSTOSCOPY;  Surgeon: Arvella Nigh, MD;  Location: [place] [facility];  Service: Gynecology;  Laterality: N/A;  . ESOPHAGEAL MANOMETRY N/A 11/16/2016   Procedure: ESOPHAGEAL MANOMETRY (EM);  Surgeon: Doran Stabler, MD;  [facility];  Service: Gastroenterology;  Laterality: N/A;  . ESOPHAGOGASTRODUODENOSCOPY    . LAPAROSCOPIC CHOLECYSTECTOMY  1997  . LAPAROSCOPY N/A 03/26/2016   Procedure: LAPAROSCOPY DIAGNOSTIC, LYSIS OF ADHESIONS;  Surgeon: Arvella Nigh, MD;  Location: [place];  Service: Gynecology;  Laterality: N/A;  . LAPAROTOMY N/A 04/28/2016   Procedure: EXPLORATORY LAPAROTOMY;  Surgeon: Arvella Nigh, MD;  Location: [place] [facility];  Service: Gynecology;  Laterality: N/A;  . RENAL BIOPSY    . SALPINGOOPHORECTOMY Bilateral 04/28/2016   Procedure: Bilateral OOPHORECTOMY;  Surgeon: Arvella Nigh, MD;  Location: [place] [facility];  Service: Gynecology;  Laterality: Bilateral;  . THYROIDECTOMY Left 1991  . TRANSTHORACIC ECHOCARDIOGRAM  04/27/2012   normal LV, ef 60-65%/  trivial MR  . TUBAL LIGATION  1992    Family History  Problem Relation Age of Onset  . Hypertension Father   . Coronary artery disease Father   . Diabetes Father   . Stroke Father   . Prostate cancer Father   . Diabetes Mother        Pre-diabetic  . Hypertension Mother   . Breast cancer Maternal Aunt   . Thyroid disease Maternal Aunt   . Colon cancer Neg Hx   . Colon polyps Neg Hx   . Kidney disease Neg Hx   . Esophageal cancer Neg Hx   . Gallbladder disease Neg Hx     Social History:  reports that she has never smoked. She has never used smokeless tobacco. She reports that she does not drink alcohol and does not use drugs.  Allergies:   Allergies  Allergen Reactions  .  Aspirin Hives  . Flagyl [Metronidazole] Nausea And Vomiting    With oral flagyl.   Has tolerated vaginal flagyl.    . Food Other (See Comments)    Lima beans--  Positive allergy test   . Tramadol Other (See Comments)    Headache   . Dilaudid [Hydromorphone] Itching    Allergies as of 02/20/2020      Reactions   Aspirin Hives   Flagyl [metronidazole] Nausea And Vomiting   With oral  flagyl.   Has tolerated vaginal flagyl.     Food Other (See Comments)   Lima beans--  Positive allergy test    Tramadol Other (See Comments)   Headache    Dilaudid [hydromorphone] Itching      Medication List       Accurate as of February 20, 2020  4:04 PM. If you have any questions, ask your nurse or doctor.        acyclovir cream 5 % Commonly known as: ZOVIRAX Apply 1 application topically every 3 (three) hours.   albuterol 108 (90 Base) MCG/ACT inhaler Commonly known as: VENTOLIN HFA INHALE 2 PUFFS INTO THE LUNGS EVERY 6 HOURS AS NEEDED   ALPRAZolam 0.25 MG tablet Commonly known as: XANAX Take 1 tablet (0.25 mg total) by mouth 2 (two) times daily as needed for anxiety.   atorvastatin 40 MG tablet Commonly known as: LIPITOR TAKE 1 TABLET(40 MG) BY MOUTH DAILY   docusate sodium 100 MG capsule Commonly known as: COLACE Take 100 mg by mouth daily.   levothyroxine 100 MCG tablet Commonly known as: SYNTHROID TAKE 1 TABLET(100 MCG) BY MOUTH DAILY BEFORE BREAKFAST   metoprolol succinate 50 MG 24 hr tablet Commonly known as: TOPROL-XL TAKE 1 TABLET BY MOUTH EVERY DAY IMMEDIATELY FOLLOWING A MEAL   multivitamin with minerals tablet Take 1 tablet by mouth daily.   olmesartan-hydrochlorothiazide 40-25 MG tablet Commonly known as: BENICAR HCT Take 1 tablet by mouth daily.   omeprazole 40 MG capsule Commonly known as: PRILOSEC TAKE ONE CAPSULE BY MOUTH DAILY What changed: when to take this   ondansetron 8 MG tablet Commonly known as: Zofran Take 1 tablet (8 mg total) by mouth every 8  (eight) hours as needed for nausea or vomiting.          Review of Systems  Hypertension, followed by her PCP  She has had a hysterectomy           Examination:    BP 118/68 (BP Location: Left Arm, Patient Position: Sitting, Cuff Size: Large)   Pulse 81   Ht 5\' 6"  (1.676 m)   Wt 223 lb (101.2 kg)   SpO2 95%   BMI 35.99 kg/m   Thyroid not palpable Slight irregularity felt over the right lower trachea, possibly scar tissue No lymphadenopathy in the neck  Assessment:  HYPOTHYROIDISM, postsurgical  Now with taking 100 mcg of levothyroxine for about a year her TSH is staying fairly normal She is not taking any interacting supplements at the same time and is taking her levothyroxine regularly every day She feels fairly good overall  Neck exam is unremarkable   HYPERCALCEMIA: This has been transient and likely related to her HCTZ No recent labs available  PLAN:  Continue levothyroxine 100 mcg daily If she needs a multivitamin she can take the multivitamin at lunch or dinner  Follow-up in 12 months   Paiden Cavell 02/20/2020, 4:04 PM     Note: This office note was prepared with Dragon voice recognition system technology. Any transcriptional errors that result from this process are unintentional.   Elayne Snare

## 2020-02-21 ENCOUNTER — Encounter: Payer: Self-pay | Admitting: Internal Medicine

## 2020-02-21 ENCOUNTER — Ambulatory Visit: Payer: PRIVATE HEALTH INSURANCE | Admitting: Internal Medicine

## 2020-02-21 VITALS — BP 110/80 | HR 58 | Temp 97.7°F | Wt 221.1 lb

## 2020-02-21 DIAGNOSIS — M5442 Lumbago with sciatica, left side: Secondary | ICD-10-CM

## 2020-02-21 DIAGNOSIS — G8929 Other chronic pain: Secondary | ICD-10-CM | POA: Diagnosis not present

## 2020-02-21 MED ORDER — METHYLPREDNISOLONE ACETATE 40 MG/ML IJ SUSP
40.0000 mg | Freq: Once | INTRAMUSCULAR | Status: AC
  Administered 2020-02-21: 40 mg via INTRAMUSCULAR

## 2020-02-21 MED ORDER — PREDNISONE 10 MG (21) PO TBPK: ORAL_TABLET | ORAL | 0 refills | Status: DC

## 2020-02-21 NOTE — Patient Instructions (Signed)
-Nice seeing you today!!  -Steroid injection in office today.  -Prednisone taper for 6 days.  -Back stretches and icing every day.  -If no improvement, call for physical therapy referral.   Sciatica  Sciatica is pain, weakness, tingling, or loss of feeling (numbness) along the sciatic nerve. The sciatic nerve starts in the lower back and goes down the back of each leg. Sciatica usually goes away on its own or with treatment. Sometimes, sciatica may come back (recur). What are the causes? This condition happens when the sciatic nerve is pinched or has pressure put on it. This may be the result of:  A disk in between the bones of the spine bulging out too far (herniated disk).  Changes in the spinal disks that occur with aging.  A condition that affects a muscle in the butt.  Extra bone growth near the sciatic nerve.  A break (fracture) of the area between your hip bones (pelvis).  Pregnancy.  Tumor. This is rare. What increases the risk? You are more likely to develop this condition if you:  Play sports that put pressure or stress on the spine.  Have poor strength and ease of movement (flexibility).  Have had a back injury in the past.  Have had back surgery.  Sit for long periods of time.  Do activities that involve bending or lifting over and over again.  Are very overweight (obese). What are the signs or symptoms? Symptoms can vary from mild to very bad. They may include:  Any of these problems in the lower back, leg, hip, or butt: ? Mild tingling, loss of feeling, or dull aches. ? Burning sensations. ? Sharp pains.  Loss of feeling in the back of the calf or the sole of the foot.  Leg weakness.  Very bad back pain that makes it hard to move. These symptoms may get worse when you cough, sneeze, or laugh. They may also get worse when you sit or stand for long periods of time. How is this treated? This condition often gets better without any treatment.  However, treatment may include:  Changing or cutting back on physical activity when you have pain.  Doing exercises and stretching.  Putting ice or heat on the affected area.  Medicines that help: ? To relieve pain and swelling. ? To relax your muscles.  Shots (injections) of medicines that help to relieve pain, irritation, and swelling.  Surgery. Follow these instructions at home: Medicines  Take over-the-counter and prescription medicines only as told by your doctor.  Ask your doctor if the medicine prescribed to you: ? Requires you to avoid driving or using heavy machinery. ? Can cause trouble pooping (constipation). You may need to take these steps to prevent or treat trouble pooping:  Drink enough fluids to keep your pee (urine) pale yellow.  Take over-the-counter or prescription medicines.  Eat foods that are high in fiber. These include beans, whole grains, and fresh fruits and vegetables.  Limit foods that are high in fat and sugar. These include fried or sweet foods. Managing pain      If told, put ice on the affected area. ? Put ice in a plastic bag. ? Place a towel between your skin and the bag. ? Leave the ice on for 20 minutes, 2-3 times a day.  If told, put heat on the affected area. Use the heat source that your doctor tells you to use, such as a moist heat pack or a heating pad. ? Place  a towel between your skin and the heat source. ? Leave the heat on for 20-30 minutes. ? Remove the heat if your skin turns bright red. This is very important if you are unable to feel pain, heat, or cold. You may have a greater risk of getting burned. Activity   Return to your normal activities as told by your doctor. Ask your doctor what activities are safe for you.  Avoid activities that make your symptoms worse.  Take short rests during the day. ? When you rest for a long time, do some physical activity or stretching between periods of rest. ? Avoid sitting for  a long time without moving. Get up and move around at least one time each hour.  Exercise and stretch regularly, as told by your doctor.  Do not lift anything that is heavier than 10 lb (4.5 kg) while you have symptoms of sciatica. ? Avoid lifting heavy things even when you do not have symptoms. ? Avoid lifting heavy things over and over.  When you lift objects, always lift in a way that is safe for your body. To do this, you should: ? Bend your knees. ? Keep the object close to your body. ? Avoid twisting. General instructions  Stay at a healthy weight.  Wear comfortable shoes that support your feet. Avoid wearing high heels.  Avoid sleeping on a mattress that is too soft or too hard. You might have less pain if you sleep on a mattress that is firm enough to support your back.  Keep all follow-up visits as told by your doctor. This is important. Contact a doctor if:  You have pain that: ? Wakes you up when you are sleeping. ? Gets worse when you lie down. ? Is worse than the pain you have had in the past. ? Lasts longer than 4 weeks.  You lose weight without trying. Get help right away if:  You cannot control when you pee (urinate) or poop (have a bowel movement).  You have weakness in any of these areas and it gets worse: ? Lower back. ? The area between your hip bones. ? Butt. ? Legs.  You have redness or swelling of your back.  You have a burning feeling when you pee. Summary  Sciatica is pain, weakness, tingling, or loss of feeling (numbness) along the sciatic nerve.  This condition happens when the sciatic nerve is pinched or has pressure put on it.  Sciatica can cause pain, tingling, or loss of feeling (numbness) in the lower back, legs, hips, and butt.  Treatment often includes rest, exercise, medicines, and putting ice or heat on the affected area. This information is not intended to replace advice given to you by your health care provider. Make sure you  discuss any questions you have with your health care provider. Document Revised: 08/22/2018 Document Reviewed: 08/22/2018 Elsevier Patient Education  [place].

## 2020-02-21 NOTE — Progress Notes (Signed)
Established Patient Office Visit     This visit occurred during the SARS-CoV-2 public health emergency.  Safety protocols were in place, including screening questions prior to the visit, additional usage of staff PPE, and extensive cleaning of exam room while observing appropriate contact time as indicated for disinfecting solutions.    CC/Reason for Visit: Acute flareup of chronic back pain  HPI: Rhonda [name] is a 49 y.o. female who is coming in today for the above mentioned reasons.  She has a complex past medical history no significant for hypothyroidism, impaired glucose tolerance, vitamin D deficiency, hyperlipidemia, stage III chronic kidney disease, GERD, hypertension, history of DVT/PE.  She has had chronic back pain for years and has been diagnosed with sciatica in the past.  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  She has been feeling a flareup of her sciatica in the past few weeks.  It tends to favor her left side.  Pain is mainly of bilateral lower back and buttocks and radiates down the back of her left leg.  She does not have weakness of her leg.   Past Medical/Surgical History: Past Medical History:  Diagnosis Date  . Anxiety   . C1q nephropathy   . Cancer [facility])    history of thyroid cancer  . Chronic constipation   . CKD (chronic kidney disease), stage II nephrologist-  dr Gretchen Short [facility])   secondary to C1q nephropathy/  hypertension  . Family history of adverse reaction to anesthesia    father hard to put down for anesthesia  . GERD (gastroesophageal reflux disease)   . H/O radioactive iodine thyroid ablation    1992  right throid  . History of exercise intolerance    01-052011  ETT--  normal w/ no ischemia per cardiologist (dr Aundra Dubin) epic note  . History of pulmonary embolus (PE)    07/ 2008-- treated w/ coumadin  . History of syncope     recurrent --  workup done per epic -- dx orthostatic hypotension  . Hypertension   . IBS (irritable bowel syndrome)   . Pelvic pain in female   . 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 Proteinuria   . Seasonal and perennial allergic rhinitis   . Wears contact lenses     Past Surgical History:  Procedure Laterality Date  . [place] STUDY N/A 11/16/2016   Procedure: [place] STUDY;  Surgeon: Doran Stabler, MD;  [facility];  Service: Gastroenterology;  Laterality: N/A;  . ABDOMINAL HYSTERECTOMY  1998  . CYSTOSCOPY N/A 04/28/2016   Procedure: CYSTOSCOPY;  Surgeon: Arvella Nigh, MD;  Location: [place] [facility];  Service: Gynecology;  Laterality: N/A;  . ESOPHAGEAL MANOMETRY N/A 11/16/2016   Procedure: ESOPHAGEAL MANOMETRY (EM);  Surgeon: Doran Stabler, MD;  [facility];  Service: Gastroenterology;  Laterality: N/A;  . ESOPHAGOGASTRODUODENOSCOPY    . LAPAROSCOPIC CHOLECYSTECTOMY  1997  . LAPAROSCOPY N/A 03/26/2016   Procedure: LAPAROSCOPY DIAGNOSTIC, LYSIS OF ADHESIONS;  Surgeon: Arvella Nigh, MD;  Location: [place];  Service: Gynecology;  Laterality: N/A;  . LAPAROTOMY N/A 04/28/2016   Procedure: EXPLORATORY LAPAROTOMY;  Surgeon: Arvella Nigh, MD;  Location: [place] [facility];  Service: Gynecology;  Laterality: N/A;  . RENAL BIOPSY    . SALPINGOOPHORECTOMY Bilateral 04/28/2016   Procedure: Bilateral OOPHORECTOMY;  Surgeon: Arvella Nigh, MD;  Location: [facility];  Service: Gynecology;  Laterality: Bilateral;  . THYROIDECTOMY Left 1991  . TRANSTHORACIC ECHOCARDIOGRAM  04/27/2012   normal LV, ef 60-65%/  trivial MR  . TUBAL LIGATION  1992    Social History:  reports that she has never smoked. She has never used smokeless tobacco. She reports that she does not drink alcohol and does not use drugs.  Allergies: Allergies  Allergen Reactions  . Aspirin Hives  . Flagyl [Metronidazole] Nausea And Vomiting    With oral flagyl.   Has tolerated vaginal  flagyl.    . Food Other (See Comments)    Lima beans--  Positive allergy test   . Tramadol Other (See Comments)    Headache   . Dilaudid [Hydromorphone] Itching    Family History:  Family History  Problem Relation Age of Onset  . Hypertension Father   . Coronary artery disease Father   . Diabetes Father   . Stroke Father   . Prostate cancer Father   . Diabetes Mother        Pre-diabetic  . Hypertension Mother   . Breast cancer Maternal Aunt   . Thyroid disease Maternal Aunt   . Colon cancer Neg Hx   . Colon polyps Neg Hx   . Kidney disease Neg Hx   . Esophageal cancer Neg Hx   . Gallbladder disease Neg Hx      Current Outpatient Medications:  .  acyclovir cream (ZOVIRAX) 5 %, Apply 1 application topically every 3 (three) hours., Disp: 15 g, Rfl: 0 .  albuterol (PROVENTIL HFA;VENTOLIN HFA) 108 (90 Base) MCG/ACT inhaler, INHALE 2 PUFFS INTO THE LUNGS EVERY 6 HOURS AS NEEDED, Disp: 6.7 g, Rfl: 1 .  ALPRAZolam (XANAX) 0.25 MG tablet, Take 1 tablet (0.25 mg total) by mouth 2 (two) times daily as needed for anxiety., Disp: 60 tablet, Rfl: 0 .  atorvastatin (LIPITOR) 40 MG tablet, TAKE 1 TABLET(40 MG) BY MOUTH DAILY, Disp: 90 tablet, Rfl: 1 .  docusate sodium (COLACE) 100 MG capsule, Take 100 mg by mouth daily., Disp: , Rfl:  .  levothyroxine (SYNTHROID) 100 MCG tablet, TAKE 1 TABLET(100 MCG) BY MOUTH DAILY BEFORE BREAKFAST, Disp: 30 tablet, Rfl: 3 .  metoprolol succinate (TOPROL-XL) 50 MG 24 hr tablet, TAKE 1 TABLET BY MOUTH EVERY DAY IMMEDIATELY FOLLOWING A MEAL, Disp: 90 tablet, Rfl: 1 .  Multiple Vitamins-Minerals (MULTIVITAMIN WITH MINERALS) tablet, Take 1 tablet by mouth daily., Disp: , Rfl:  .  olmesartan-hydrochlorothiazide (BENICAR HCT) 40-25 MG tablet, Take 1 tablet by mouth daily., Disp: 90 tablet, Rfl: 3 .  omeprazole (PRILOSEC) 40 MG capsule, TAKE ONE CAPSULE BY MOUTH DAILY (Patient taking differently: Take 40 mg by mouth. ), Disp: 90 capsule, Rfl: 1 .  ondansetron  (ZOFRAN) 8 MG tablet, Take 1 tablet (8 mg total) by mouth every 8 (eight) hours as needed for nausea or vomiting., Disp: 30 tablet, Rfl: 0  Review of Systems:  Constitutional: Denies fever, chills, diaphoresis, appetite change and fatigue.  HEENT: Denies photophobia, eye pain, redness, hearing loss, ear pain, congestion, sore throat, rhinorrhea, sneezing, mouth sores, trouble swallowing, neck pain, neck stiffness and tinnitus.   Respiratory: Denies SOB, DOE, cough, chest tightness,  and wheezing.   Cardiovascular: Denies chest pain, palpitations and leg swelling.  Gastrointestinal: Denies nausea, vomiting, abdominal pain, diarrhea, constipation, blood in stool and abdominal distention.  Genitourinary: Denies dysuria, urgency, frequency, hematuria, flank pain and difficulty urinating.  Endocrine: Denies: hot or cold intolerance, sweats, changes in hair or nails,  polyuria, polydipsia. Musculoskeletal: Denies  joint swelling, arthralgias and gait problem.  Skin: Denies pallor, rash and wound.  Neurological: Denies dizziness, seizures, syncope, weakness, light-headedness, numbness and headaches.  Hematological: Denies adenopathy. Easy bruising, personal or family bleeding history  Psychiatric/Behavioral: Denies suicidal ideation, mood changes, confusion, nervousness, sleep disturbance and agitation    Physical Exam: Vitals:   02/21/20 0704  BP: 110/80  Pulse: (!) 58  Temp: 97.7 F (36.5 C)  TempSrc: Temporal  SpO2: 96%  Weight: 221 lb 1.6 oz (100.3 kg)    Body mass index is 35.69 kg/m.   Constitutional: NAD, calm, comfortable Eyes: PERRL, lids and conjunctivae normal ENMT: Mucous membranes are moist.  Neurologic: Grossly intact and nonfocal Psychiatric: Normal judgment and insight. Alert and oriented x 3. Normal mood.    Impression and Plan:  Chronic bilateral low back pain with left-sided sciatica -Suspect an acute flareup of her chronic lower back pain with left-sided  sciatica. -Do not believe we need to image her back at this time, but can consider for persistent or worsening symptoms. -Have advised icing, back stretches and consideration of physical therapy.  Long-term weight loss would be helpful. -Will give a methylprednisolone injection in office today and send home with a 6-day prednisone taper. -She is advised to follow-up in 10 to 14 days if no improvement.   Patient Instructions  -Nice seeing you today!!  -Steroid injection in office today.  -Prednisone taper for 6 days.  -Back stretches and icing every day.  -If no improvement, call for physical therapy referral.   Sciatica  Sciatica is pain, weakness, tingling, or loss of feeling (numbness) along the sciatic nerve. The sciatic nerve starts in the lower back and goes down the back of each leg. Sciatica usually goes away on its own or with treatment. Sometimes, sciatica may come back (recur). What are the causes? This condition happens when the sciatic nerve is pinched or has pressure put on it. This may be the result of:  A disk in between the bones of the spine bulging out too far (herniated disk).  Changes in the spinal disks that occur with aging.  A condition that affects a muscle in the butt.  Extra bone growth near the sciatic nerve.  A break (fracture) of the area between your hip bones (pelvis).  Pregnancy.  Tumor. This is rare. What increases the risk? You are more likely to develop this condition if you:  Play sports that put pressure or stress on the spine.  Have poor strength and ease of movement (flexibility).  Have had a back injury in the past.  Have had back surgery.  Sit for long periods of time.  Do activities that involve bending or lifting over and over again.  Are very overweight (obese). What are the signs or symptoms? Symptoms can vary from mild to very bad. They may include:  Any of these problems in the lower back, leg, hip, or  butt: ? Mild tingling, loss of feeling, or dull aches. ? Burning sensations. ? Sharp pains.  Loss of feeling in the back of the calf or the sole of the foot.  Leg weakness.  Very bad back pain that makes it hard to move. These symptoms may get worse when you cough, sneeze, or laugh. They may also get worse when you sit or stand for long periods of time. How is this treated? This condition often gets better without any treatment. However, treatment may include:  Changing or cutting back on  physical activity when you have pain.  Doing exercises and stretching.  Putting ice or heat on the affected area.  Medicines that help: ? To relieve pain and swelling. ? To relax your muscles.  Shots (injections) of medicines that help to relieve pain, irritation, and swelling.  Surgery. Follow these instructions at home: Medicines  Take over-the-counter and prescription medicines only as told by your doctor.  Ask your doctor if the medicine prescribed to you: ? Requires you to avoid driving or using heavy machinery. ? Can cause trouble pooping (constipation). You may need to take these steps to prevent or treat trouble pooping:  Drink enough fluids to keep your pee (urine) pale yellow.  Take over-the-counter or prescription medicines.  Eat foods that are high in fiber. These include beans, whole grains, and fresh fruits and vegetables.  Limit foods that are high in fat and sugar. These include fried or sweet foods. Managing pain      If told, put ice on the affected area. ? Put ice in a plastic bag. ? Place a towel between your skin and the bag. ? Leave the ice on for 20 minutes, 2-3 times a day.  If told, put heat on the affected area. Use the heat source that your doctor tells you to use, such as a moist heat pack or a heating pad. ? Place a towel between your skin and the heat source. ? Leave the heat on for 20-30 minutes. ? Remove the heat if your skin turns bright red.  This is very important if you are unable to feel pain, heat, or cold. You may have a greater risk of getting burned. Activity   Return to your normal activities as told by your doctor. Ask your doctor what activities are safe for you.  Avoid activities that make your symptoms worse.  Take short rests during the day. ? When you rest for a long time, do some physical activity or stretching between periods of rest. ? Avoid sitting for a long time without moving. Get up and move around at least one time each hour.  Exercise and stretch regularly, as told by your doctor.  Do not lift anything that is heavier than 10 lb (4.5 kg) while you have symptoms of sciatica. ? Avoid lifting heavy things even when you do not have symptoms. ? Avoid lifting heavy things over and over.  When you lift objects, always lift in a way that is safe for your body. To do this, you should: ? Bend your knees. ? Keep the object close to your body. ? Avoid twisting. General instructions  Stay at a healthy weight.  Wear comfortable shoes that support your feet. Avoid wearing high heels.  Avoid sleeping on a mattress that is too soft or too hard. You might have less pain if you sleep on a mattress that is firm enough to support your back.  Keep all follow-up visits as told by your doctor. This is important. Contact a doctor if:  You have pain that: ? Wakes you up when you are sleeping. ? Gets worse when you lie down. ? Is worse than the pain you have had in the past. ? Lasts longer than 4 weeks.  You lose weight without trying. Get help right away if:  You cannot control when you pee (urinate) or poop (have a bowel movement).  You have weakness in any of these areas and it gets worse: ? Lower back. ? The area between your hip bones. ?  Butt. ? Legs.  You have redness or swelling of your back.  You have a burning feeling when you pee. Summary  Sciatica is pain, weakness, tingling, or loss of  feeling (numbness) along the sciatic nerve.  This condition happens when the sciatic nerve is pinched or has pressure put on it.  Sciatica can cause pain, tingling, or loss of feeling (numbness) in the lower back, legs, hips, and butt.  Treatment often includes rest, exercise, medicines, and putting ice or heat on the affected area. This information is not intended to replace advice given to you by your health care provider. Make sure you discuss any questions you have with your health care provider. Document Revised: 08/22/2018 Document Reviewed: 08/22/2018 Elsevier Patient Education  2020 [place], [place] [facility]

## 2020-02-21 NOTE — Addendum Note (Signed)
Addended by: Westley Hummer B on: 02/21/2020 07:59 AM   Modules accepted: Orders

## 2020-03-04 NOTE — Addendum Note (Signed)
Addended by: Marrion Coy on: 03/04/2020 08:59 AM   Modules accepted: Orders

## 2020-03-14 ENCOUNTER — Other Ambulatory Visit: Payer: PRIVATE HEALTH INSURANCE

## 2020-04-14 ENCOUNTER — Other Ambulatory Visit: Payer: Self-pay | Admitting: Endocrinology

## 2020-04-17 ENCOUNTER — Other Ambulatory Visit: Payer: Self-pay | Admitting: Internal Medicine

## 2020-04-17 DIAGNOSIS — E785 Hyperlipidemia, unspecified: Secondary | ICD-10-CM

## 2020-04-18 ENCOUNTER — Other Ambulatory Visit: Payer: Self-pay | Admitting: Internal Medicine

## 2020-04-18 DIAGNOSIS — Z1231 Encounter for screening mammogram for malignant neoplasm of breast: Secondary | ICD-10-CM

## 2020-04-23 ENCOUNTER — Other Ambulatory Visit: Payer: Self-pay

## 2020-04-23 ENCOUNTER — Other Ambulatory Visit: Payer: 59

## 2020-04-23 DIAGNOSIS — E559 Vitamin D deficiency, unspecified: Secondary | ICD-10-CM

## 2020-04-23 DIAGNOSIS — E785 Hyperlipidemia, unspecified: Secondary | ICD-10-CM

## 2020-04-23 DIAGNOSIS — M542 Cervicalgia: Secondary | ICD-10-CM | POA: Diagnosis not present

## 2020-04-23 DIAGNOSIS — M25512 Pain in left shoulder: Secondary | ICD-10-CM | POA: Diagnosis not present

## 2020-04-24 ENCOUNTER — Other Ambulatory Visit: Payer: Self-pay | Admitting: Internal Medicine

## 2020-04-24 DIAGNOSIS — E559 Vitamin D deficiency, unspecified: Secondary | ICD-10-CM

## 2020-04-24 LAB — LIPID PANEL
Cholesterol: 144 mg/dL (ref <200)
HDL: 39 mg/dL — ABNORMAL LOW (ref >=50)
LDL Cholesterol (Calc): 84 mg/dL (calc)
Non-HDL Cholesterol (Calc): 105 mg/dL (calc) (ref <130)
Total CHOL/HDL Ratio: 3.7 (calc) (ref <5.0)
Triglycerides: 109 mg/dL (ref <150)

## 2020-04-24 LAB — VITAMIN D 25 HYDROXY (VIT D DEFICIENCY, FRACTURES): Vit D, 25-Hydroxy: 27 ng/mL — ABNORMAL LOW (ref 30–100)

## 2020-04-24 MED ORDER — VITAMIN D (ERGOCALCIFEROL) 1.25 MG (50000 UNIT) PO CAPS: 50000.0000 [IU] | ORAL_CAPSULE | ORAL | 0 refills | Status: DC

## 2020-05-02 ENCOUNTER — Ambulatory Visit
Admission: RE | Admit: 2020-05-02 | Discharge: 2020-05-02 | Disposition: A | Discharge status: Home / Self Care | Payer: PRIVATE HEALTH INSURANCE | Source: Ambulatory Visit | Attending: Internal Medicine | Admitting: Internal Medicine

## 2020-05-02 ENCOUNTER — Other Ambulatory Visit: Payer: Self-pay

## 2020-05-02 DIAGNOSIS — Z1231 Encounter for screening mammogram for malignant neoplasm of breast: Secondary | ICD-10-CM | POA: Diagnosis not present

## 2020-05-14 ENCOUNTER — Other Ambulatory Visit: Payer: Self-pay | Admitting: Internal Medicine

## 2020-06-07 DIAGNOSIS — Z20822 Contact with and (suspected) exposure to covid-19: Secondary | ICD-10-CM | POA: Diagnosis not present

## 2020-06-13 ENCOUNTER — Encounter: Payer: Self-pay | Admitting: Internal Medicine

## 2020-06-13 ENCOUNTER — Ambulatory Visit (INDEPENDENT_AMBULATORY_CARE_PROVIDER_SITE_OTHER): Payer: 59 | Admitting: Internal Medicine

## 2020-06-13 ENCOUNTER — Other Ambulatory Visit: Payer: Self-pay

## 2020-06-13 VITALS — BP 110/74 | HR 66 | Temp 98.6°F | Wt 219.0 lb

## 2020-06-13 DIAGNOSIS — R7302 Impaired glucose tolerance (oral): Secondary | ICD-10-CM

## 2020-06-13 DIAGNOSIS — R739 Hyperglycemia, unspecified: Secondary | ICD-10-CM | POA: Diagnosis not present

## 2020-06-13 DIAGNOSIS — K219 Gastro-esophageal reflux disease without esophagitis: Secondary | ICD-10-CM | POA: Diagnosis not present

## 2020-06-13 DIAGNOSIS — I10 Essential (primary) hypertension: Secondary | ICD-10-CM | POA: Diagnosis not present

## 2020-06-13 DIAGNOSIS — E559 Vitamin D deficiency, unspecified: Secondary | ICD-10-CM

## 2020-06-13 DIAGNOSIS — E890 Postprocedural hypothyroidism: Secondary | ICD-10-CM | POA: Diagnosis not present

## 2020-06-13 DIAGNOSIS — E785 Hyperlipidemia, unspecified: Secondary | ICD-10-CM | POA: Diagnosis not present

## 2020-06-13 DIAGNOSIS — N183 Chronic kidney disease, stage 3 unspecified: Secondary | ICD-10-CM | POA: Diagnosis not present

## 2020-06-13 LAB — POCT GLYCOSYLATED HEMOGLOBIN (HGB A1C): Hemoglobin A1C: 5.9 % — AB (ref 4.0–5.6)

## 2020-06-13 NOTE — Progress Notes (Signed)
Established Patient Office Visit     This visit occurred during the SARS-CoV-2 public health emergency.  Safety protocols were in place, including screening questions prior to the visit, additional usage of staff PPE, and extensive cleaning of exam room while observing appropriate contact time as indicated for disinfecting solutions.    CC/Reason for Visit: Follow-up chronic medical conditions  HPI: Rhonda [name] is a 49 y.o. female who is coming in today for the above mentioned reasons. Past Medical History is significant for: hypothyroidism, impaired glucose tolerance, vitamin D deficiency, hyperlipidemia, stage III chronic kidney disease, GERD, hypertension, history of DVT/PE.  She has had chronic back pain for years.  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  Other than that she has been feeling well.  She just refilled her vitamin D for another 12 weeks.  She had her flu vaccine last week, she is now eligible for Covid booster.   Past Medical/Surgical History: Past Medical History:  Diagnosis Date  . Anxiety   . C1q nephropathy   . Cancer [facility])    history of thyroid cancer  . Chronic constipation   . CKD (chronic kidney disease), stage II nephrologist-  dr Gretchen Short [facility])   secondary to C1q nephropathy/  hypertension  . Family history of adverse reaction to anesthesia    father hard to put down for anesthesia  . GERD (gastroesophageal reflux disease)   . H/O radioactive iodine thyroid ablation    1992  right throid  . History of exercise intolerance    01-052011  ETT--  normal w/ no ischemia per cardiologist (dr Aundra Dubin) epic note  . History of pulmonary embolus (PE)    07/ 2008-- treated w/ coumadin  . History of syncope    recurrent --  workup done per epic -- dx orthostatic hypotension  . Hypertension   . IBS (irritable bowel  syndrome)   . Pelvic pain in female   . 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 Proteinuria   . Seasonal and perennial allergic rhinitis   . Wears contact lenses     Past Surgical History:  Procedure Laterality Date  . [place] STUDY N/A 11/16/2016   Procedure: [place] STUDY;  Surgeon: Doran Stabler, MD;  [facility];  Service: Gastroenterology;  Laterality: N/A;  . ABDOMINAL HYSTERECTOMY  1998  . CYSTOSCOPY N/A 04/28/2016   Procedure: CYSTOSCOPY;  Surgeon: Arvella Nigh, MD;  Location: [place] [facility];  Service: Gynecology;  Laterality: N/A;  . ESOPHAGEAL MANOMETRY N/A 11/16/2016   Procedure: ESOPHAGEAL MANOMETRY (EM);  Surgeon: Doran Stabler, MD;  [facility];  Service: Gastroenterology;  Laterality: N/A;  . ESOPHAGOGASTRODUODENOSCOPY    . LAPAROSCOPIC CHOLECYSTECTOMY  1997  . LAPAROSCOPY N/A 03/26/2016   Procedure: LAPAROSCOPY DIAGNOSTIC, LYSIS OF ADHESIONS;  Surgeon: Arvella Nigh, MD;  Location: [place];  Service: Gynecology;  Laterality: N/A;  . LAPAROTOMY N/A 04/28/2016   Procedure: EXPLORATORY LAPAROTOMY;  Surgeon: Arvella Nigh, MD;  Location: [place] [facility];  Service: Gynecology;  Laterality: N/A;  . RENAL BIOPSY    . SALPINGOOPHORECTOMY Bilateral 04/28/2016   Procedure: Bilateral OOPHORECTOMY;  Surgeon: Arvella Nigh, MD;  Location: [place] [facility];  Service: Gynecology;  Laterality: Bilateral;  . THYROIDECTOMY Left 1991  . TRANSTHORACIC ECHOCARDIOGRAM  04/27/2012   normal LV, ef 60-65%/  trivial  MR  . TUBAL LIGATION  1992    Social History:  reports that she has never smoked. She has never used smokeless tobacco. She reports that she does not drink alcohol and does not use drugs.  Allergies: Allergies  Allergen Reactions  . Aspirin Hives  . Flagyl [Metronidazole] Nausea And Vomiting    With oral flagyl.   Has tolerated vaginal flagyl.    . Food Other (See Comments)    Lima beans--  Positive allergy test   . Tramadol Other (See  Comments)    Headache   . Dilaudid [Hydromorphone] Itching    Family History:  Family History  Problem Relation Age of Onset  . Hypertension Father   . Coronary artery disease Father   . Diabetes Father   . Stroke Father   . Prostate cancer Father   . Diabetes Mother        Pre-diabetic  . Hypertension Mother   . Breast cancer Maternal Aunt   . Thyroid disease Maternal Aunt   . Colon cancer Neg Hx   . Colon polyps Neg Hx   . Kidney disease Neg Hx   . Esophageal cancer Neg Hx   . Gallbladder disease Neg Hx      Current Outpatient Medications:  .  acyclovir cream (ZOVIRAX) 5 %, Apply 1 application topically every 3 (three) hours., Disp: 15 g, Rfl: 0 .  albuterol (PROVENTIL HFA;VENTOLIN HFA) 108 (90 Base) MCG/ACT inhaler, INHALE 2 PUFFS INTO THE LUNGS EVERY 6 HOURS AS NEEDED, Disp: 6.7 g, Rfl: 1 .  ALPRAZolam (XANAX) 0.25 MG tablet, Take 1 tablet (0.25 mg total) by mouth 2 (two) times daily as needed for anxiety., Disp: 60 tablet, Rfl: 0 .  atorvastatin (LIPITOR) 40 MG tablet, TAKE 1 TABLET(40 MG) BY MOUTH DAILY, Disp: 90 tablet, Rfl: 0 .  docusate sodium (COLACE) 100 MG capsule, Take 100 mg by mouth daily., Disp: , Rfl:  .  levothyroxine (SYNTHROID) 100 MCG tablet, TAKE 1 TABLET(100 MCG) BY MOUTH DAILY BEFORE BREAKFAST, Disp: 90 tablet, Rfl: 3 .  metoprolol succinate (TOPROL-XL) 50 MG 24 hr tablet, TAKE 1 TABLET BY MOUTH EVERY DAY IMMEDIATELY FOLLOWING A MEAL, Disp: 90 tablet, Rfl: 1 .  Multiple Vitamins-Minerals (MULTIVITAMIN WITH MINERALS) tablet, Take 1 tablet by mouth daily., Disp: , Rfl:  .  olmesartan-hydrochlorothiazide (BENICAR HCT) 40-25 MG tablet, Take 1 tablet by mouth daily., Disp: 90 tablet, Rfl: 3 .  omeprazole (PRILOSEC) 40 MG capsule, TAKE 1 CAPSULE(40 MG) BY MOUTH DAILY, Disp: 90 capsule, Rfl: 1 .  ondansetron (ZOFRAN) 8 MG tablet, Take 1 tablet (8 mg total) by mouth every 8 (eight) hours as needed for nausea or vomiting., Disp: 30 tablet, Rfl: 0 .  Vitamin D,  Ergocalciferol, (DRISDOL) 1.25 MG (50000 UNIT) CAPS capsule, Take 1 capsule (50,000 Units total) by mouth every 7 (seven) days for 12 doses., Disp: 12 capsule, Rfl: 0  Review of Systems:  Constitutional: Denies fever, chills, diaphoresis, appetite change and fatigue.  HEENT: Denies photophobia, eye pain, redness, hearing loss, ear pain, congestion, sore throat, rhinorrhea, sneezing, mouth sores, trouble swallowing, neck pain, neck stiffness and tinnitus.   Respiratory: Denies SOB, DOE, cough, chest tightness,  and wheezing.   Cardiovascular: Denies chest pain, palpitations and leg swelling.  Gastrointestinal: Denies nausea, vomiting, abdominal pain, diarrhea, constipation, blood in stool and abdominal distention.  Genitourinary: Denies dysuria, urgency, frequency, hematuria, flank pain and difficulty urinating.  Endocrine: Denies: hot or cold intolerance, sweats, changes in hair or nails, polyuria, polydipsia.  Musculoskeletal: Denies myalgias, joint swelling, arthralgias and gait problem.  Skin: Denies pallor, rash and wound.  Neurological: Denies dizziness, seizures, syncope, weakness, light-headedness, numbness and headaches.  Hematological: Denies adenopathy. Easy bruising, personal or family bleeding history  Psychiatric/Behavioral: Denies suicidal ideation, mood changes, confusion, nervousness, sleep disturbance and agitation    Physical Exam: Vitals:   06/13/20 1030  BP: 110/74  Pulse: 66  Temp: 98.6 F (37 C)  TempSrc: Oral  SpO2: 97%  Weight: 219 lb (99.3 kg)    Body mass index is 35.35 kg/m.   Constitutional: NAD, calm, comfortable Eyes: PERRL, lids and conjunctivae normal, wears corrective lenses ENMT: Mucous membranes are moist.  Respiratory: clear to auscultation bilaterally, no wheezing, no crackles. Normal respiratory effort. No accessory muscle use.  Cardiovascular: Regular rate and rhythm, no murmurs / rubs / gallops. No extremity edema. Neurologic: Grossly  intact and nonfocal. Psychiatric: Normal judgment and insight. Alert and oriented x 3. Normal mood.    Impression and Plan:  Vitamin D deficiency -Recheck levels when she completes 12 weeks of high-dose supplementation.  IGT (impaired glucose tolerance) -A1c today stable at 5.9, I have continued to encourage lifestyle modifications including weight loss, increased physical activity and healthy eating.  Stage 3 chronic kidney disease, unspecified whether stage 3a or 3b CKD ([place]) -Her baseline creatinine is around 1.4-0.  Postoperative hypothyroidism -Followed by endocrinology, her TSH was 0.820 in July.  Dyslipidemia -Last LDL was 84, she remains on atorvastatin 40 mg daily.  Essential hypertension -Well-controlled.  Gastroesophageal reflux disease without esophagitis -Well-controlled on daily omeprazole 40 mg.   Lelon Frohlich, MD [facility]

## 2020-06-14 ENCOUNTER — Ambulatory Visit: Payer: PRIVATE HEALTH INSURANCE | Admitting: Internal Medicine

## 2020-06-19 ENCOUNTER — Other Ambulatory Visit: Payer: Self-pay | Admitting: Internal Medicine

## 2020-06-19 ENCOUNTER — Other Ambulatory Visit: Payer: Self-pay | Admitting: Gastroenterology

## 2020-06-19 DIAGNOSIS — E559 Vitamin D deficiency, unspecified: Secondary | ICD-10-CM

## 2020-06-21 ENCOUNTER — Other Ambulatory Visit: Payer: Self-pay | Admitting: Internal Medicine

## 2020-08-03 DIAGNOSIS — J0100 Acute maxillary sinusitis, unspecified: Secondary | ICD-10-CM | POA: Diagnosis not present

## 2020-08-12 ENCOUNTER — Other Ambulatory Visit: Payer: Self-pay | Admitting: Internal Medicine

## 2020-08-12 DIAGNOSIS — E785 Hyperlipidemia, unspecified: Secondary | ICD-10-CM

## 2020-08-26 DIAGNOSIS — Z6836 Body mass index (BMI) 36.0-36.9, adult: Secondary | ICD-10-CM | POA: Diagnosis not present

## 2020-08-26 DIAGNOSIS — Z01419 Encounter for gynecological examination (general) (routine) without abnormal findings: Secondary | ICD-10-CM | POA: Diagnosis not present

## 2020-09-30 DIAGNOSIS — Z13820 Encounter for screening for osteoporosis: Secondary | ICD-10-CM | POA: Diagnosis not present

## 2020-10-07 ENCOUNTER — Other Ambulatory Visit: Payer: Self-pay | Admitting: Internal Medicine

## 2020-10-07 DIAGNOSIS — E559 Vitamin D deficiency, unspecified: Secondary | ICD-10-CM

## 2020-10-18 ENCOUNTER — Encounter: Payer: Self-pay | Admitting: Internal Medicine

## 2020-10-18 ENCOUNTER — Other Ambulatory Visit: Payer: Self-pay

## 2020-10-18 ENCOUNTER — Ambulatory Visit (INDEPENDENT_AMBULATORY_CARE_PROVIDER_SITE_OTHER): Payer: 59 | Admitting: Internal Medicine

## 2020-10-18 VITALS — BP 110/80 | HR 76 | Temp 98.3°F | Wt 219.9 lb

## 2020-10-18 DIAGNOSIS — F439 Reaction to severe stress, unspecified: Secondary | ICD-10-CM

## 2020-10-18 DIAGNOSIS — R202 Paresthesia of skin: Secondary | ICD-10-CM | POA: Diagnosis not present

## 2020-10-18 DIAGNOSIS — R69 Illness, unspecified: Secondary | ICD-10-CM | POA: Diagnosis not present

## 2020-10-18 LAB — VITAMIN D 25 HYDROXY (VIT D DEFICIENCY, FRACTURES): VITD: 45.24 ng/mL (ref 30.00–100.00)

## 2020-10-18 LAB — TSH: TSH: 2.59 u[IU]/mL (ref 0.35–4.50)

## 2020-10-18 LAB — VITAMIN B12: Vitamin B-12: 416 pg/mL (ref 211–911)

## 2020-10-18 NOTE — Addendum Note (Signed)
Addended by: Janann Colonel on: 10/18/2020 11:55 AM   Modules accepted: Orders

## 2020-10-18 NOTE — Progress Notes (Signed)
Established Patient Office Visit     This visit occurred during the SARS-CoV-2 public health emergency.  Safety protocols were in place, including screening questions prior to the visit, additional usage of staff PPE, and extensive cleaning of exam room while observing appropriate contact time as indicated for disinfecting solutions.    CC/Reason for Visit: "Tingling all over"  HPI: Rhonda [name] is a 50 y.o. female who is coming in today for the above mentioned reasons. Past Medical History is significant for: Hypothyroidism, impaired glucose tolerance, vitamin D deficiency, hyperlipidemia, stage III chronic kidney disease, GERD, hypertension.  She states that for about 2 months she has had a tingling sensation all over.  Feels like pins-and-needles.  It is over both shoulders, both arms, both legs, her back and her torso are involved.  She has not had any weakness.  She admits recently to a lot of increased social stressors including family and work.  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   Past Medical/Surgical History: Past Medical History:  Diagnosis Date  . Anxiety   . C1q nephropathy   . Cancer [facility])    history of thyroid cancer  . Chronic constipation   . CKD (chronic kidney disease), stage II nephrologist-  dr Gretchen Short [facility])   secondary to C1q nephropathy/  hypertension  . Family history of adverse reaction to anesthesia    father hard to put down for anesthesia  . GERD (gastroesophageal reflux disease)   . H/O radioactive iodine thyroid ablation    1992  right throid  . History of exercise intolerance    [id number]  ETT--  normal w/ no ischemia per cardiologist (dr Aundra Dubin) epic note  . History of pulmonary embolus (PE)    07/ 2008-- treated w/ coumadin  . History of syncope    recurrent --  workup done per epic --  dx orthostatic hypotension  . Hypertension   . IBS (irritable bowel syndrome)   . Pelvic pain in female   . 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 Proteinuria   . Seasonal and perennial allergic rhinitis   . Wears contact lenses     Past Surgical History:  Procedure Laterality Date  . [place] STUDY N/A 11/16/2016   Procedure: [place] STUDY;  Surgeon: Doran Stabler, MD;  Location: [facility];  Service: Gastroenterology;  Laterality: N/A;  . ABDOMINAL HYSTERECTOMY  1998  . CYSTOSCOPY N/A 04/28/2016   Procedure: CYSTOSCOPY;  Surgeon: Arvella Nigh, MD;  Location: [facility];  Service: Gynecology;  Laterality: N/A;  . ESOPHAGEAL MANOMETRY N/A 11/16/2016   Procedure: ESOPHAGEAL MANOMETRY (EM);  Surgeon: Doran Stabler, MD;  Location: [facility];  Service: Gastroenterology;  Laterality: N/A;  . ESOPHAGOGASTRODUODENOSCOPY    . LAPAROSCOPIC CHOLECYSTECTOMY  1997  . LAPAROSCOPY N/A 03/26/2016   Procedure: LAPAROSCOPY DIAGNOSTIC, LYSIS OF ADHESIONS;  Surgeon: Arvella Nigh, MD;  Location: [place];  Service: Gynecology;  Laterality: N/A;  . LAPAROTOMY N/A 04/28/2016   Procedure: EXPLORATORY LAPAROTOMY;  Surgeon: Arvella Nigh, MD;  Location: [facility];  Service: Gynecology;  Laterality: N/A;  . RENAL BIOPSY    . SALPINGOOPHORECTOMY Bilateral 04/28/2016   Procedure: Bilateral OOPHORECTOMY;  Surgeon: Arvella Nigh, MD;  Location: [facility];  Service: Gynecology;  Laterality: Bilateral;  . THYROIDECTOMY Left 1991  .  TRANSTHORACIC ECHOCARDIOGRAM  04/27/2012   normal LV, ef 60-65%/  trivial MR  . TUBAL LIGATION  1992    Social History:  reports that she has never smoked. She has never used smokeless tobacco. She reports that she does not drink alcohol and does not use drugs.  Allergies: Allergies  Allergen Reactions  . Aspirin Hives  . Flagyl [Metronidazole] Nausea And Vomiting    With oral flagyl.   Has tolerated vaginal flagyl.    . Food Other (See  Comments)    Lima beans--  Positive allergy test   . Tramadol Other (See Comments)    Headache   . Dilaudid [Hydromorphone] Itching    Family History:  Family History  Problem Relation Age of Onset  . Hypertension Father   . Coronary artery disease Father   . Diabetes Father   . Stroke Father   . Prostate cancer Father   . Diabetes Mother        Pre-diabetic  . Hypertension Mother   . Breast cancer Maternal Aunt   . Thyroid disease Maternal Aunt   . Colon cancer Neg Hx   . Colon polyps Neg Hx   . Kidney disease Neg Hx   . Esophageal cancer Neg Hx   . Gallbladder disease Neg Hx      Current Outpatient Medications:  .  acyclovir cream (ZOVIRAX) 5 %, Apply 1 application topically every 3 (three) hours., Disp: 15 g, Rfl: 0 .  albuterol (PROVENTIL HFA;VENTOLIN HFA) 108 (90 Base) MCG/ACT inhaler, INHALE 2 PUFFS INTO THE LUNGS EVERY 6 HOURS AS NEEDED, Disp: 6.7 g, Rfl: 1 .  ALPRAZolam (XANAX) 0.25 MG tablet, Take 1 tablet (0.25 mg total) by mouth 2 (two) times daily as needed for anxiety., Disp: 60 tablet, Rfl: 0 .  atorvastatin (LIPITOR) 40 MG tablet, TAKE 1 TABLET(40 MG) BY MOUTH DAILY, Disp: 90 tablet, Rfl: 1 .  docusate sodium (COLACE) 100 MG capsule, Take 100 mg by mouth daily., Disp: , Rfl:  .  levothyroxine (SYNTHROID) 100 MCG tablet, TAKE 1 TABLET(100 MCG) BY MOUTH DAILY BEFORE BREAKFAST, Disp: 90 tablet, Rfl: 3 .  metoprolol succinate (TOPROL-XL) 50 MG 24 hr tablet, TAKE 1 TABLET BY MOUTH EVERY DAY IMMEDIATELY FOLLOWING A MEAL, Disp: 90 tablet, Rfl: 1 .  Multiple Vitamins-Minerals (MULTIVITAMIN WITH MINERALS) tablet, Take 1 tablet by mouth daily., Disp: , Rfl:  .  olmesartan-hydrochlorothiazide (BENICAR HCT) 40-25 MG tablet, Take 1 tablet by mouth daily., Disp: 90 tablet, Rfl: 3 .  omeprazole (PRILOSEC) 40 MG capsule, TAKE 1 CAPSULE(40 MG) BY MOUTH DAILY, Disp: 90 capsule, Rfl: 1 .  omeprazole (PRILOSEC) 40 MG capsule, TAKE 1 CAPSULE(40 MG) BY MOUTH DAILY, Disp: 90 capsule,  Rfl: 1 .  ondansetron (ZOFRAN) 8 MG tablet, Take 1 tablet (8 mg total) by mouth every 8 (eight) hours as needed for nausea or vomiting., Disp: 30 tablet, Rfl: 0 .  Vitamin D, Ergocalciferol, (DRISDOL) 1.25 MG (50000 UNIT) CAPS capsule, TAKE 1 CAPSULE BY MOUTH EVERY 7 DAYS FOR 12 DOSES, Disp: 12 capsule, Rfl: 0  Review of Systems:  Constitutional: Denies fever, chills, diaphoresis, appetite change and fatigue.  HEENT: Denies photophobia, eye pain, redness, hearing loss, ear pain, congestion, sore throat, rhinorrhea, sneezing, mouth sores, trouble swallowing, neck pain, neck stiffness and tinnitus.   Respiratory: Denies SOB, DOE, cough, chest tightness,  and wheezing.   Cardiovascular: Denies chest pain, palpitations and leg swelling.  Gastrointestinal: Denies nausea, vomiting, abdominal pain, diarrhea, constipation, blood in stool and abdominal distention.  Genitourinary: Denies dysuria, urgency, frequency, hematuria, flank pain and difficulty urinating.  Endocrine: Denies: hot or cold intolerance, sweats, changes in hair or nails, polyuria, polydipsia. Musculoskeletal: Denies myalgias, back pain, joint swelling, arthralgias and gait problem.  Skin: Denies pallor, rash and wound.  Neurological: Denies dizziness, seizures, syncope, weakness, light-headedness and headaches.  Hematological: Denies adenopathy. Easy bruising, personal or family bleeding history  Psychiatric/Behavioral: Denies suicidal ideation, mood changes, confusion, nervousness, sleep disturbance and agitation    Physical Exam: Vitals:   10/18/20 1134  BP: 110/80  Pulse: 76  Temp: 98.3 F (36.8 C)  TempSrc: Oral  SpO2: 97%  Weight: 219 lb 14.4 oz (99.7 kg)    Body mass index is 35.49 kg/m.   Constitutional: NAD, calm, comfortable Eyes: PERRL, lids and conjunctivae normal, wears corrective lenses ENMT: Mucous membranes are moist.  Respiratory: clear to auscultation bilaterally, no wheezing, no crackles. Normal  respiratory effort. No accessory muscle use.  Cardiovascular: Regular rate and rhythm, no murmurs / rubs / gallops. No extremity edema.  Skin: no rashes, lesions, ulcers. No induration Neurologic: Grossly intact and nonfocal Psychiatric: Normal judgment and insight. Alert and oriented x 3. Normal mood.    Impression and Plan:  Tingling sensation  Stress -Check TSH, vitamin B12, vitamin D. -I wonder if increased social stressors might be contributing.  She has been provided information regarding licensed counseling, she will schedule CBT sessions as soon as possible.    Lelon Frohlich, MD [facility] at [facility]

## 2020-11-10 ENCOUNTER — Other Ambulatory Visit: Payer: Self-pay | Admitting: Internal Medicine

## 2020-11-13 ENCOUNTER — Other Ambulatory Visit: Payer: Self-pay | Admitting: Internal Medicine

## 2020-11-13 MED ORDER — ACYCLOVIR 5 % EX CREA: 1.0000 "application " | TOPICAL_CREAM | CUTANEOUS | 2 refills | Status: DC

## 2020-11-20 ENCOUNTER — Encounter: Payer: Self-pay | Admitting: Internal Medicine

## 2020-12-27 ENCOUNTER — Encounter: Payer: Self-pay | Admitting: Internal Medicine

## 2020-12-27 MED ORDER — ALBUTEROL SULFATE HFA 108 (90 BASE) MCG/ACT IN AERS: 2.0000 | INHALATION_SPRAY | Freq: Four times a day (QID) | RESPIRATORY_TRACT | 1 refills | Status: DC | PRN

## 2021-01-01 ENCOUNTER — Encounter: Payer: Self-pay | Admitting: Internal Medicine

## 2021-01-01 DIAGNOSIS — G5600 Carpal tunnel syndrome, unspecified upper limb: Secondary | ICD-10-CM

## 2021-01-06 NOTE — Progress Notes (Signed)
Subjective:    I'm seeing this patient as a consultation for:  Dr. Isaac Bliss. Note will be routed back to referring provider/PCP.  CC: B hand pain and tingling, R>L  I, Molly Weber, LAT, ATC, am serving as scribe for Dr. Lynne Leader.  HPI: Pt is a 50 y/o female presenting w/ c/o B hand pain and paresthesias, R>L, x few months.    Radiating pain: No Hand swelling: No Numbness/tingling: yes in B hands, entire hand Aggravating factors: gripping/grasping activities; wringing out washcloth; working as a CNA Treatments tried: Tylenol; heat  Past medical history, Surgical history, Family history, Social history, Allergies, and medications have been entered into the medical record, reviewed.   Review of Systems: No new headache, visual changes, nausea, vomiting, diarrhea, constipation, dizziness, abdominal pain, skin rash, fevers, chills, night sweats, weight loss, swollen lymph nodes, body aches, joint swelling, muscle aches, chest pain, shortness of breath, mood changes, visual or auditory hallucinations.   Objective:    Vitals:   01/07/21 0753  BP: (!) 138/92  Pulse: 72  SpO2: 96%   General: Well Developed, well nourished, and in no acute distress.  Neuro/Psych: Alert and oriented x3, extra-ocular muscles intact, able to move all 4 extremities, sensation grossly intact. Skin: Warm and dry, no rashes noted.  Respiratory: Not using accessory muscles, speaking in full sentences, trachea midline.  Cardiovascular: Pulses palpable, no extremity edema. Abdomen: Does not appear distended. MSK: Bilateral wrists are normal-appearing Normal wrist motion. Nontender. Positive Tinel's right wrist.  Negative Phalen's test right wrist. Positive Tinel's left wrist.  Negative Phalen's test left wrist. Pulses cap refill and sensation are intact distally.  Lab and Radiology Results  Diagnostic Limited MSK Ultrasound of: Bilateral carpal tunnel Right carpal tunnel: Enlarged median nerve.   Median nerve branches in the carpal tunnel and is associated directly with the radial artery and is enlarged appearing greater than 2 cm cross-sectional area. The flexor carpi radialis tendon at carpal tunnel is intact.  However associated with some hypoechoic fluid within tendon sheath consistent with tenosynovitis.  Left carpal tunnel similar to right with enlarged median nerve branch and carpal tunnel enlarged and associated with radial artery.  Median nerve enlarged greater than 2 cm cross-sectional area. Again flexor carpi radialis tendon at carpal tunnel is intact however hypoechoic fluid within the tendon sheath is consistent with tenosynovitis.  Impression: Bilateral carpal tunnel syndrome and bilateral flexor carpi radialis tenosynovitis   Impression and Recommendations:    Assessment and Plan: 50 y.o. female with carpal tunnel syndrome bilaterally, flexor carpi radialis tenosynovitis bilaterally.  Plan for night splints, short course of prednisone and gabapentin, home exercise program and recheck in 1 month.  If not better consider injection and nerve conduction study.Marland Kitchen  PDMP not reviewed this encounter. Orders Placed This Encounter  Procedures  . [place] LIMITED JOINT SPACE STRUCTURES UP BILAT(NO LINKED CHARGES)    Order Specific Question:   Reason for Exam (SYMPTOM  OR DIAGNOSIS REQUIRED)    Answer:   B hand pain    Order Specific Question:   Preferred imaging location?    Answer:   [place]   Meds ordered this encounter  Medications  . gabapentin (NEURONTIN) 300 MG capsule    Sig: Take 1 capsule (300 mg total) by mouth at bedtime.    Dispense:  30 capsule    Refill:  3  . predniSONE (DELTASONE) 50 MG tablet    Sig: Take 1 tablet (50 mg total) by mouth  daily.    Dispense:  5 tablet    Refill:  0    Discussed warning signs or symptoms. Please see discharge instructions. Patient expresses understanding.   The above documentation has been  reviewed and is accurate and complete Lynne Leader, M.D.

## 2021-01-07 ENCOUNTER — Ambulatory Visit (INDEPENDENT_AMBULATORY_CARE_PROVIDER_SITE_OTHER): Payer: 59 | Admitting: Family Medicine

## 2021-01-07 ENCOUNTER — Encounter: Payer: Self-pay | Admitting: Family Medicine

## 2021-01-07 ENCOUNTER — Ambulatory Visit: Payer: Self-pay

## 2021-01-07 ENCOUNTER — Other Ambulatory Visit: Payer: Self-pay

## 2021-01-07 VITALS — BP 138/92 | HR 72 | Ht 66.0 in | Wt 223.6 lb

## 2021-01-07 DIAGNOSIS — M79641 Pain in right hand: Secondary | ICD-10-CM

## 2021-01-07 DIAGNOSIS — G5603 Carpal tunnel syndrome, bilateral upper limbs: Secondary | ICD-10-CM | POA: Diagnosis not present

## 2021-01-07 DIAGNOSIS — G5601 Carpal tunnel syndrome, right upper limb: Secondary | ICD-10-CM | POA: Insufficient documentation

## 2021-01-07 DIAGNOSIS — M778 Other enthesopathies, not elsewhere classified: Secondary | ICD-10-CM | POA: Insufficient documentation

## 2021-01-07 DIAGNOSIS — M79642 Pain in left hand: Secondary | ICD-10-CM | POA: Diagnosis not present

## 2021-01-07 MED ORDER — PREDNISONE 50 MG PO TABS: 50.0000 mg | ORAL_TABLET | Freq: Every day | ORAL | 0 refills | Status: DC

## 2021-01-07 MED ORDER — GABAPENTIN 300 MG PO CAPS: 300.0000 mg | ORAL_CAPSULE | Freq: Every day | ORAL | 3 refills | Status: DC

## 2021-01-07 NOTE — Patient Instructions (Addendum)
Thank you for coming in today.  Use the carpal tunnel wrist brace at bedtime.   Gabapentin at bedtime.   Prednisone daily for 5 days.   Do the exercises we taught you.   Please perform the exercise program that we have prepared for you and gone over in detail on a daily basis.  In addition to the handout you were provided you can access your program through: [email or web address]   Your unique program code is: [email or web address]   Please use Voltaren gel (Generic Diclofenac Gel) up to 4x daily for pain as needed.  This is available over-the-counter as both the name brand Voltaren gel and the generic diclofenac gel.  Recheck in about 1 month.

## 2021-01-24 ENCOUNTER — Ambulatory Visit (INDEPENDENT_AMBULATORY_CARE_PROVIDER_SITE_OTHER): Payer: Managed Care, Other (non HMO) | Admitting: Family Medicine

## 2021-01-24 ENCOUNTER — Other Ambulatory Visit: Payer: Self-pay

## 2021-01-24 ENCOUNTER — Encounter: Payer: Self-pay | Admitting: Family Medicine

## 2021-01-24 VITALS — BP 100/80 | HR 92 | Temp 98.2°F | Wt 223.0 lb

## 2021-01-24 DIAGNOSIS — I10 Essential (primary) hypertension: Secondary | ICD-10-CM

## 2021-01-24 DIAGNOSIS — N183 Chronic kidney disease, stage 3 unspecified: Secondary | ICD-10-CM

## 2021-01-24 DIAGNOSIS — R000 Tachycardia, unspecified: Secondary | ICD-10-CM | POA: Diagnosis not present

## 2021-01-24 MED ORDER — METOPROLOL SUCCINATE ER 50 MG PO TB24: 25.0000 mg | ORAL_TABLET | Freq: Every day | ORAL | 1 refills | Status: DC

## 2021-01-24 MED ORDER — BENAZEPRIL-HYDROCHLOROTHIAZIDE 20-25 MG PO TABS: 1.0000 | ORAL_TABLET | Freq: Every day | ORAL | 0 refills | Status: DC

## 2021-01-24 NOTE — Progress Notes (Signed)
   Subjective:    Patient ID: Rhonda [name], female    DOB: 1970/12/20, 50 y.o.   MRN: [id number]  HPI Here for 2 weeks of periods where she feels her heart racing fast. These usually occur with exertion. No chest pain or SOB. They last a few seconds only. She has also had spells of lightheadedness which occur when she stands up from a sitting position. These also last a few seconds only. She saw Dr. Jonnie Finner, her nephrologist, in April and she said her BP was too low. She advised the patient to take only 1/2 tablet (25 mg) of Metoprolol daily. She had an ECHO in 2013 that was normal. Her renal function in April was "stable" per the patient.    Review of Systems  Constitutional: Negative.   Respiratory: Negative.    Cardiovascular:  Positive for palpitations. Negative for chest pain and leg swelling.  Neurological:  Positive for light-headedness. Negative for dizziness.      Objective:   Physical Exam Constitutional:      Appearance: Normal appearance. She is not ill-appearing.  Cardiovascular:     Rate and Rhythm: Normal rate and regular rhythm.     Pulses: Normal pulses.     Heart sounds: Normal heart sounds.     Comments: EKG today shows NSR with a rate of 83. She has diffuse T wave inversions (which were also present in an EKG from a year ago)  Pulmonary:     Effort: Pulmonary effort is normal.     Breath sounds: Normal breath sounds.  Musculoskeletal:     Right lower leg: No edema.     Left lower leg: No edema.  Neurological:     General: No focal deficit present.     Mental Status: She is alert and oriented to person, place, and time.          Assessment & Plan:  Intermittent lightheadedness and rapid heartbeats which are likely from hypotension. We will decrease the Benicar HCT to 20-25 daily. Follow up with Dr. Jerilee Hoh in a few weeks. We spent 35 minutes reviewing her records and discussing these issues today.  Alysia Penna, MD

## 2021-01-29 NOTE — Progress Notes (Signed)
   I, Wendy Poet, LAT, ATC, am serving as scribe for Dr. Lynne Leader.  Rhonda [name] is a 50 y.o. female who presents to [place] at [facility] today for f/u of B hand pain and tingling, R>L.  She was last seen by Dr. Georgina Snell on 01/07/21 and was advised to use night wrist splints and was prescribed Gabapentin and a short course of prednisone.  She was also provided a HEP.  Since her last visit, pt reports R hand is very painful. Pt reports she did something to her R wrist at work and has been forced to wear a splint on the R wrist at work. Pt reports pain is esp bad along R thenar eminence and into R thumb w/ throbbing pain over wrist.   Patient would like to avoid injections if possible.   Pertinent review of systems: No fevers or chills  Relevant historical information: Hypertension   Exam:  BP 111/76 (BP Location: Right Arm, Patient Position: Sitting, Cuff Size: Normal)   Pulse 64   Ht 5\' 6"  (1.676 m)   Wt 223 lb 12.8 oz (101.5 kg)   SpO2 96%   BMI 36.12 kg/m  General: Well Developed, well nourished, and in no acute distress.   MSK: Right thumb tender palpation first [place].  Decreased thumb motion.    Lab and Radiology Results  X-ray images right hand obtained today personally and independently interpreted First [facility] DJD with osteophytes present.  No acute fractures. Await formal radiology review     Assessment and Plan: 50 y.o. female with pain base of thumb right hand due to exacerbation of DJD.  Plan for thumb spica splint.  Recheck in 1 month if not better we will proceed with injection.  Additionally use Voltaren gel.  Patient would very much like to avoid injections today if possible.  Patient was seen last month for carpal tunnel syndrome and is failing to improve.  At this point we will proceed with nerve conduction study to further evaluate presumed carpal tunnel syndrome.   PDMP not reviewed this encounter. Orders Placed This  Encounter  Procedures   DG Hand Complete Right    Standing Status:   Future    Number of Occurrences:   1    Standing Expiration Date:   01/30/2022    Order Specific Question:   Reason for Exam (SYMPTOM  OR DIAGNOSIS REQUIRED)    Answer:   eval pain base thumb    Order Specific Question:   Is patient pregnant?    Answer:   No    Order Specific Question:   Preferred imaging location?    Answer:   [facility]   Ambulatory referral to Neurology    Referral Priority:   Routine    Referral Type:   Consultation    Referral Reason:   Specialty Services Required    Requested Specialty:   Neurology    Number of Visits Requested:   1   NCV with EMG(electromyography)    Standing Status:   Future    Standing Expiration Date:   01/30/2022    Order Specific Question:   Where should this test be performed?    Answer:   [facility]   No orders of the defined types were placed in this encounter.    Discussed warning signs or symptoms. Please see discharge instructions. Patient expresses understanding.   The above documentation has been reviewed and is accurate and complete Lynne Leader, M.D.

## 2021-01-30 ENCOUNTER — Ambulatory Visit (INDEPENDENT_AMBULATORY_CARE_PROVIDER_SITE_OTHER): Payer: Managed Care, Other (non HMO)

## 2021-01-30 ENCOUNTER — Ambulatory Visit: Payer: Self-pay | Admitting: Internal Medicine

## 2021-01-30 ENCOUNTER — Other Ambulatory Visit: Payer: Self-pay

## 2021-01-30 ENCOUNTER — Ambulatory Visit (INDEPENDENT_AMBULATORY_CARE_PROVIDER_SITE_OTHER): Payer: Managed Care, Other (non HMO) | Admitting: Family Medicine

## 2021-01-30 VITALS — BP 111/76 | HR 64 | Ht 66.0 in | Wt 223.8 lb

## 2021-01-30 DIAGNOSIS — G5603 Carpal tunnel syndrome, bilateral upper limbs: Secondary | ICD-10-CM

## 2021-01-30 DIAGNOSIS — M79644 Pain in right finger(s): Secondary | ICD-10-CM | POA: Diagnosis not present

## 2021-01-30 NOTE — Patient Instructions (Signed)
Thank you for coming in today.   Please use Voltaren gel (Generic Diclofenac Gel) up to 4x daily for pain as needed.  This is available over-the-counter as both the name brand Voltaren gel and the generic diclofenac gel.   Use the thumb spica splint.   Please get an Xray today before you leave

## 2021-01-31 NOTE — Progress Notes (Signed)
X-ray right hand shows some arthritis at the base of the thumb

## 2021-02-04 ENCOUNTER — Ambulatory Visit: Payer: 59 | Admitting: Family Medicine

## 2021-02-08 ENCOUNTER — Other Ambulatory Visit: Payer: Self-pay | Admitting: Internal Medicine

## 2021-02-08 DIAGNOSIS — E785 Hyperlipidemia, unspecified: Secondary | ICD-10-CM

## 2021-02-15 ENCOUNTER — Other Ambulatory Visit: Payer: Self-pay | Admitting: Family Medicine

## 2021-02-20 ENCOUNTER — Encounter: Payer: Self-pay | Admitting: Gastroenterology

## 2021-02-21 ENCOUNTER — Other Ambulatory Visit: Payer: PRIVATE HEALTH INSURANCE

## 2021-02-26 ENCOUNTER — Ambulatory Visit: Payer: PRIVATE HEALTH INSURANCE | Admitting: Endocrinology

## 2021-02-27 ENCOUNTER — Telehealth: Payer: Self-pay | Admitting: *Deleted

## 2021-02-27 NOTE — Telephone Encounter (Signed)
John,  Would you please review this pt's surgical/anesthesia procedure from 2017?  She had an EGD here at [facility] in 2018 but just wanted to check for her upcoming colonoscopy on 03-17-21.  Please advise.  Thanks, [employer]

## 2021-02-28 ENCOUNTER — Other Ambulatory Visit: Payer: Self-pay

## 2021-02-28 ENCOUNTER — Ambulatory Visit (AMBULATORY_SURGERY_CENTER): Payer: Managed Care, Other (non HMO) | Admitting: *Deleted

## 2021-02-28 VITALS — Ht 65.0 in | Wt 224.0 lb

## 2021-02-28 DIAGNOSIS — Z1211 Encounter for screening for malignant neoplasm of colon: Secondary | ICD-10-CM

## 2021-02-28 MED ORDER — NA SULFATE-K SULFATE-MG SULF 17.5-3.13-1.6 GM/177ML PO SOLN: 1.0000 | Freq: Once | ORAL | 0 refills | Status: AC

## 2021-02-28 NOTE — Progress Notes (Signed)
Virtual pre-visit completed. Instructions sent through secure e-mail to [email or web address].   No egg or soy allergy known to patient  No issues with past sedation with any surgeries or procedures Patient denies ever being told they had issues or difficulty with intubation  No FH of Malignant Hyperthermia No diet pills per patient No home 02 use per patient  No blood thinners per patient  Pt denies issues with constipation  No A fib or A flutter  EMMI video to pt or via MyChart  COVID 19 guidelines implemented in [place] today with Pt and RN   Discussed with pt there will be an out-of-pocket cost for prep and that varies from $0 to 70 dollars   Due to the COVID-19 pandemic we are asking patients to follow certain guidelines.  Pt aware of COVID protocols and [facility] guidelines

## 2021-03-03 ENCOUNTER — Encounter: Payer: Self-pay | Admitting: Internal Medicine

## 2021-03-03 NOTE — Telephone Encounter (Signed)
Dr.Danis,  This patient is a difficult intubation and needs to be done at the hospital per John Nulty,CRNA. Wilmer for direct hospital or OV? Please advise. Thank you, Derk Doubek PV

## 2021-03-03 NOTE — Telephone Encounter (Signed)
I called the patient, no answer, left a message for the patient to call us back about this phone note. Colon at [facility] cancelled.

## 2021-03-03 NOTE — Telephone Encounter (Signed)
Please explain to the patient the anesthesia service's decision on this and the need to have her procedure in the hospital endoscopy lab.  My August date is full, so it can go on my September [place] block.  Alternatively, if the patient is comfortable having it done with another physician, it can go in our new open access slots starting September 1 with our backup hospital physician.

## 2021-03-04 ENCOUNTER — Other Ambulatory Visit: Payer: Self-pay

## 2021-03-04 ENCOUNTER — Ambulatory Visit (INDEPENDENT_AMBULATORY_CARE_PROVIDER_SITE_OTHER): Payer: Managed Care, Other (non HMO) | Admitting: Family Medicine

## 2021-03-04 ENCOUNTER — Encounter: Payer: Self-pay | Admitting: Family Medicine

## 2021-03-04 VITALS — BP 130/90 | HR 68 | Ht 65.0 in | Wt 220.0 lb

## 2021-03-04 DIAGNOSIS — Z1211 Encounter for screening for malignant neoplasm of colon: Secondary | ICD-10-CM

## 2021-03-04 DIAGNOSIS — M79644 Pain in right finger(s): Secondary | ICD-10-CM | POA: Diagnosis not present

## 2021-03-04 NOTE — Patient Instructions (Signed)
Thank you for coming in today.   Please use Voltaren gel (Generic Diclofenac Gel) up to 4x daily for pain as needed.  This is available over-the-counter as both the name brand Voltaren gel and the generic diclofenac gel.   If not ok I can do a shot.   Let me know.   Recheck as needed.

## 2021-03-04 NOTE — Telephone Encounter (Signed)
Inbound call from patient returning call to schedule procedure at [facility].

## 2021-03-04 NOTE — Progress Notes (Signed)
   Rito Ehrlich, am serving as a Education administrator for Dr. Lynne Leader.   Rhonda [name] is a 50 y.o. female who presents to [place] at [facility] today for f/u R thumb pain due to exacerabation of DJD and bilat carpal tunnel syndrome. Pt was last seen by Dr. Georgina Snell on 01/30/21 and was advised to wear a thumb spica splint, use Voltaren gel and proceed to NCV study. Today, pt reports a little better, still had the numbness and tingling and some spasms in her wrist. Cannot wear the thumb spica splint at work it is to [employer] but wears it at home. Nerve conduction scheduled for 09/20   Dx testing: 01/30/21 Bilat NCV study (not completed)  01/30/21 R hand XR  Pertinent review of systems: No fevers or chills  Relevant historical information: History of carpal tunnel syndrome   Exam:  BP 130/90 (BP Location: Left Arm, Patient Position: Sitting, Cuff Size: Normal)   Pulse 68   Ht 5\' 5"  (1.651 m)   Wt 220 lb (99.8 kg)   SpO2 100%   BMI 36.61 kg/m  General: Well Developed, well nourished, and in no acute distress.   MSK: Right thumb slightly enlarged CMC.  Mildly tender palpation.  Normal motion.  Normal strength.  Pulses cap refill and sensation are intact distally.    Lab and Radiology Results  EXAM: RIGHT HAND - COMPLETE 3+ VIEW   COMPARISON:  None   FINDINGS: Osseous mineralization normal.   Degenerative changes at first [facility] joint with joint space narrowing and spur formation.   The remaining joint spaces preserved.   No acute fracture, dislocation, or bone destruction.   IMPRESSION: Degenerative changes at RIGHT first CMC joint.     Electronically Signed   By: Lavonia Dana M.D.   On: 01/31/2021 09:14 I, Lynne Leader, personally (independently) visualized and performed the interpretation of the images attached in this note.      Assessment and Plan: 50 y.o. female with right thumb pain due to first [facility] DJD.  Doing reasonably well with  conservative management.  Discussed next steps and options.  Next step would be an injection.  Patient thinks her pain is not bad enough to pursue that at this time.  Additionally could pursue formal hand PT she also thinks is not bad enough to do that either.  Watchful waiting for now check back as needed.   Discussed warning signs or symptoms. Please see discharge instructions. Patient expresses understanding.   The above documentation has been reviewed and is accurate and complete Lynne Leader, M.D.  Total encounter time 20 minutes including face-to-face time with the patient and, reviewing past medical record, and charting on the date of service.   Treatment plan and options

## 2021-03-04 NOTE — Telephone Encounter (Signed)
Spoke with patient, she has been scheduled for a virtual pre-visit on Thursday, 05/29/21 at 11 am. Patient prefers to be scheduled with Dr. Loletha Carrow. She has been scheduled for a colonoscopy at [facility] on Thursday, 06/12/21 at 7:30 am, arriving at 6 am with a care partner. Advised patient that she can keep her prep that she has already picked up if it will not be expired by the time of her new appt. Advised patient that if it is expired then we will send in a new one to her pharmacy at the time of her pre-visit. Patient is aware that I will mail a letter with her appt information. Patient verbalized understanding and had no concerns at the end of the call.

## 2021-03-17 ENCOUNTER — Other Ambulatory Visit: Payer: Self-pay | Admitting: Endocrinology

## 2021-03-17 ENCOUNTER — Encounter: Payer: Managed Care, Other (non HMO) | Admitting: Gastroenterology

## 2021-03-18 ENCOUNTER — Telehealth: Payer: Self-pay | Admitting: Endocrinology

## 2021-03-18 ENCOUNTER — Other Ambulatory Visit: Payer: Self-pay

## 2021-03-18 ENCOUNTER — Other Ambulatory Visit: Payer: Self-pay | Admitting: Endocrinology

## 2021-03-18 ENCOUNTER — Other Ambulatory Visit (INDEPENDENT_AMBULATORY_CARE_PROVIDER_SITE_OTHER): Payer: Managed Care, Other (non HMO)

## 2021-03-18 DIAGNOSIS — R7302 Impaired glucose tolerance (oral): Secondary | ICD-10-CM

## 2021-03-18 DIAGNOSIS — E890 Postprocedural hypothyroidism: Secondary | ICD-10-CM

## 2021-03-18 DIAGNOSIS — I10 Essential (primary) hypertension: Secondary | ICD-10-CM

## 2021-03-18 LAB — TSH: TSH: 1.3 u[IU]/mL (ref 0.35–5.50)

## 2021-03-18 LAB — T4, FREE: Free T4: 0.93 ng/dL (ref 0.60–1.60)

## 2021-03-18 NOTE — Telephone Encounter (Signed)
Pt is wondering if her visit on 03/27/2021 can be virtual instead of in person.

## 2021-03-21 ENCOUNTER — Other Ambulatory Visit: Payer: Self-pay | Admitting: Family Medicine

## 2021-03-21 ENCOUNTER — Telehealth: Payer: Self-pay | Admitting: Gastroenterology

## 2021-03-21 NOTE — Telephone Encounter (Signed)
Mychart message sent to patient.

## 2021-03-21 NOTE — Telephone Encounter (Signed)
Pt has an appointment Dr Jerilee Hoh on 04/01/2021, ok to refill

## 2021-03-21 NOTE — Telephone Encounter (Signed)
Inbound call from patient have procedure scheduled at [facility] 10/27. Asks if it can be rescheduled for 10/28 or 10/31 since she will be off

## 2021-03-24 ENCOUNTER — Other Ambulatory Visit: Payer: Self-pay

## 2021-03-24 ENCOUNTER — Ambulatory Visit (INDEPENDENT_AMBULATORY_CARE_PROVIDER_SITE_OTHER): Payer: Managed Care, Other (non HMO) | Admitting: Endocrinology

## 2021-03-24 ENCOUNTER — Encounter: Payer: Self-pay | Admitting: Endocrinology

## 2021-03-24 VITALS — BP 112/80 | HR 89 | Ht 65.0 in | Wt 221.2 lb

## 2021-03-24 DIAGNOSIS — E890 Postprocedural hypothyroidism: Secondary | ICD-10-CM

## 2021-03-24 NOTE — Progress Notes (Signed)
Patient ID: Rhonda [name], female   DOB: 04-Dec-1970, 50 y.o.   MRN: [id number]             Reason for Appointment:  Hypothyroidism, follow-up visit    History of Present Illness:   Hypothyroidism was first diagnosed in 1991  Hypothyroidism apparently was secondary to surgery on a goiter and no details are available She was told that after the surgery there was a focus of cancer found in her thyroid but again no details available She subsequently had radioactive iodine treatment         The patient has been treated with variable doses of levothyroxine  At her initial consultation she had been on a stable dose of 150 mcg levothyroxine and was feeling fairly good with this    However subsequently since 05/2018 despite no new symptoms at that time her TSH was 0.01 No prior levels of TSH are available on her record since 2016  RECENT HISTORY:  Her levothyroxine dose had been adjusted periodically in 2019 and also in 2020 She is still taking 100 mcg daily as a stable dose  More recently she is complaining about feeling tired most of the time She also tends to get cold easily No hair loss or significant weight change  The patient takes the levothyroxine at least 1 an hour before breakfast and is taking it regularly with water  She also takes her other medications and Prilosec at the same time Not on vitamins         Her TSH is consistently normal  Patient's weight history is as follows:  Wt Readings from Last 3 Encounters:  03/24/21 221 lb 3.2 oz (100.3 kg)  03/04/21 220 lb (99.8 kg)  02/28/21 224 lb (101.6 kg)   Thyroid function results have been as follows:  Lab Results  Component Value Date   TSH 1.30 03/18/2021   TSH 2.59 10/18/2020   TSH 0.82 02/16/2020   TSH 1.92 09/08/2019   FREET4 0.93 03/18/2021   FREET4 1.10 02/16/2020   FREET4 0.97 09/08/2019   FREET4 1.05 04/03/2019   T3FREE 3.1 09/08/2019   T3FREE 3.7 09/27/2018   T3FREE 3.2 09/02/2018    HYPERCALCEMIA:  She had a high calcium in October 2019 and 10/20 However review of her medications indicates that she was previously not on HCTZ and has been on 25 mg HCTZ since middle of 2019  PTH level done from [place] lab was high but was normal from [facility] in 2/20  Subsequently calcium levels have not been high and on the last 2 measurements back to normal  Lab Results  Component Value Date   CALCIUM 9.8 08/30/2019   CALCIUM 9.7 07/07/2019   CALCIUM 10.7 (H) 05/19/2019   CALCIUM 10.4 11/29/2018   CALCIUM 10.3 09/27/2018   CALCIUM 10.0 08/05/2018   CALCIUM 10.6 (H) 05/31/2018   Lab Results  Component Value Date   PTH 32 09/27/2018   CALCIUM 9.8 08/30/2019   CAION 5.40 08/05/2018     Past Medical History:  Diagnosis Date   Anxiety    C1q nephropathy    Cancer ([place])    history of thyroid cancer   Chronic constipation    CKD (chronic kidney disease), stage II nephrologist-  dr Gretchen Short [facility])   secondary to C1q nephropathy/  hypertension   Family history of adverse reaction to anesthesia    father hard to put down for anesthesia   GERD (gastroesophageal reflux disease)    H/O radioactive  iodine thyroid ablation    1992  right throid   History of exercise intolerance    01-052011  ETT--  normal w/ no ischemia per cardiologist (dr Aundra Dubin) epic note   History of pulmonary embolus (PE)    07/ 2008-- treated w/ coumadin   History of syncope    recurrent --  workup done per epic -- dx orthostatic hypotension   Hypertension    IBS (irritable bowel syndrome)    Pelvic pain in female    Post-surgical hypothyroidism    1991 left thyroidectomy   Proteinuria    Seasonal and perennial allergic rhinitis    Wears contact lenses     Past Surgical History:  Procedure Laterality Date   1 HOUR [place] STUDY N/A 11/16/2016   Procedure: [place] STUDY;  Surgeon: Doran Stabler, MD;  Location: [facility];  Service: Gastroenterology;   Laterality: N/A;   ABDOMINAL HYSTERECTOMY  1998   COLONOSCOPY     CYSTOSCOPY N/A 04/28/2016   Procedure: CYSTOSCOPY;  Surgeon: Arvella Nigh, MD;  Location: [facility];  Service: Gynecology;  Laterality: N/A;   ESOPHAGEAL MANOMETRY N/A 11/16/2016   Procedure: ESOPHAGEAL MANOMETRY (EM);  Surgeon: Doran Stabler, MD;  Location: [facility];  Service: Gastroenterology;  Laterality: N/A;   ESOPHAGOGASTRODUODENOSCOPY     LAPAROSCOPIC CHOLECYSTECTOMY  1997   LAPAROSCOPY N/A 03/26/2016   Procedure: LAPAROSCOPY DIAGNOSTIC, LYSIS OF ADHESIONS;  Surgeon: Arvella Nigh, MD;  Location: [place];  Service: Gynecology;  Laterality: N/A;   LAPAROTOMY N/A 04/28/2016   Procedure: EXPLORATORY LAPAROTOMY;  Surgeon: Arvella Nigh, MD;  Location: [facility];  Service: Gynecology;  Laterality: N/A;   RENAL BIOPSY     SALPINGOOPHORECTOMY Bilateral 04/28/2016   Procedure: Bilateral OOPHORECTOMY;  Surgeon: Arvella Nigh, MD;  Location: [facility];  Service: Gynecology;  Laterality: Bilateral;   THYROIDECTOMY Left 1991   TRANSTHORACIC ECHOCARDIOGRAM  04/27/2012   normal LV, ef 60-65%/  trivial MR   TUBAL LIGATION  1992    Family History  Problem Relation Age of Onset   Diabetes Mother        Pre-diabetic   Hypertension Mother    Hypertension Father    Coronary artery disease Father    Diabetes Father    Stroke Father    Prostate cancer Father    Breast cancer Maternal Aunt    Thyroid disease Maternal Aunt    Colon cancer Neg Hx    Colon polyps Neg Hx    Kidney disease Neg Hx    Esophageal cancer Neg Hx    Gallbladder disease Neg Hx    Stomach cancer Neg Hx    Rectal cancer Neg Hx     Social History:  reports that she has never smoked. She has never used smokeless tobacco. She reports that she does not drink alcohol and does not use drugs.  Allergies:   Allergies  Allergen Reactions   Aspirin Hives   Flagyl [Metronidazole] Nausea And Vomiting    With oral flagyl.   Has tolerated vaginal  flagyl.     Food Other (See Comments)    Lima beans--  Positive allergy test    Tramadol Other (See Comments)    Headache    Dilaudid [Hydromorphone] Itching    Allergies as of 03/24/2021       Reactions   Aspirin Hives   Flagyl [metronidazole] Nausea And Vomiting   With oral flagyl.   Has tolerated vaginal flagyl.     Food  Other (See Comments)   Lima beans--  Positive allergy test    Tramadol Other (See Comments)   Headache    Dilaudid [hydromorphone] Itching        Medication List        Accurate as of March 24, 2021  3:28 PM. If you have any questions, ask your nurse or doctor.          acyclovir cream 5 % Commonly known as: ZOVIRAX Apply 1 application topically every 3 (three) hours.   albuterol 108 (90 Base) MCG/ACT inhaler Commonly known as: VENTOLIN HFA Inhale 2 puffs into the lungs every 6 (six) hours as needed.   ALPRAZolam 0.25 MG tablet Commonly known as: XANAX Take 1 tablet (0.25 mg total) by mouth 2 (two) times daily as needed for anxiety.   atorvastatin 40 MG tablet Commonly known as: LIPITOR TAKE 1 TABLET(40 MG) BY MOUTH DAILY   benazepril-hydrochlorthiazide 20-25 MG tablet Commonly known as: LOTENSIN HCT Take 1 tablet by mouth daily. *follow up with primary care doctor*   cetirizine 10 MG tablet Commonly known as: ZYRTEC Take 10 mg by mouth daily.   docusate sodium 100 MG capsule Commonly known as: COLACE Take 100 mg by mouth daily.   levothyroxine 100 MCG tablet Commonly known as: SYNTHROID TAKE 1 TABLET(100 MCG) BY MOUTH DAILY BEFORE BREAKFAST   metoprolol succinate 50 MG 24 hr tablet Commonly known as: TOPROL-XL Take 0.5 tablets (25 mg total) by mouth daily. Take with or immediately following a meal.   multivitamin with minerals tablet Take 1 tablet by mouth daily.   omeprazole 40 MG capsule Commonly known as: PRILOSEC TAKE 1 CAPSULE(40 MG) BY MOUTH DAILY   ondansetron 8 MG tablet Commonly known as: Zofran Take 1 tablet (8  mg total) by mouth every 8 (eight) hours as needed for nausea or vomiting.           Review of Systems  Hypertension, followed by her PCP but has not had a visit for a few months  BP Readings from Last 3 Encounters:  03/24/21 112/80  03/04/21 130/90  01/30/21 111/76   Has been screened for diabetes with A1c is last 5.9           Examination:    BP 112/80   Pulse 89   Ht '5\' 5"'$  (1.651 m)   Wt 221 lb 3.2 oz (100.3 kg)   SpO2 95%   BMI 36.81 kg/m   Thyroid not palpable Biceps reflexes appear to be  Assessment:  HYPOTHYROIDISM, postsurgical  She is taking 100 mcg of levothyroxine consistently now  She is regular with her supplement and her TSH is within normal  Fatigue: This is not explained from her hypothyroidism   PLAN:  Continue levothyroxine 100 mcg daily If she needs a multivitamin she can take the multivitamin at breakfast time  She needs to make appointment for follow-up with her PCP regarding multiple issues including evaluation of fatigue  Follow-up in 12 months   Quinntin Malter Dwyane Dee 03/24/2021, 3:28 PM     Note: This office note was prepared with Dragon voice recognition system technology. Any transcriptional errors that result from this process are unintentional.   Elayne Snare

## 2021-03-25 ENCOUNTER — Other Ambulatory Visit: Payer: Self-pay | Admitting: Family Medicine

## 2021-03-25 ENCOUNTER — Other Ambulatory Visit: Payer: 59

## 2021-03-26 ENCOUNTER — Encounter: Payer: Self-pay | Admitting: Gastroenterology

## 2021-03-27 ENCOUNTER — Ambulatory Visit: Payer: 59 | Admitting: Endocrinology

## 2021-04-01 ENCOUNTER — Encounter: Payer: 59 | Admitting: Internal Medicine

## 2021-04-07 ENCOUNTER — Other Ambulatory Visit: Payer: Self-pay

## 2021-04-18 ENCOUNTER — Other Ambulatory Visit: Payer: Self-pay | Admitting: Internal Medicine

## 2021-04-28 ENCOUNTER — Other Ambulatory Visit: Payer: Self-pay | Admitting: Internal Medicine

## 2021-04-28 DIAGNOSIS — Z1231 Encounter for screening mammogram for malignant neoplasm of breast: Secondary | ICD-10-CM

## 2021-05-06 ENCOUNTER — Ambulatory Visit: Payer: Self-pay | Admitting: Neurology

## 2021-05-11 ENCOUNTER — Other Ambulatory Visit: Payer: Self-pay | Admitting: Family Medicine

## 2021-05-12 NOTE — Telephone Encounter (Signed)
Dr Jerilee Hoh pt

## 2021-05-13 ENCOUNTER — Encounter: Payer: Self-pay | Admitting: Internal Medicine

## 2021-05-31 ENCOUNTER — Emergency Department (HOSPITAL_BASED_OUTPATIENT_CLINIC_OR_DEPARTMENT_OTHER)
Admission: EM | Admit: 2021-05-31 | Discharge: 2021-05-31 | Disposition: A | Discharge status: Home / Self Care | Payer: Managed Care, Other (non HMO) | Attending: Emergency Medicine | Admitting: Emergency Medicine

## 2021-05-31 ENCOUNTER — Other Ambulatory Visit: Payer: Self-pay

## 2021-05-31 ENCOUNTER — Encounter (HOSPITAL_BASED_OUTPATIENT_CLINIC_OR_DEPARTMENT_OTHER): Payer: Self-pay | Admitting: Emergency Medicine

## 2021-05-31 DIAGNOSIS — Z79899 Other long term (current) drug therapy: Secondary | ICD-10-CM | POA: Insufficient documentation

## 2021-05-31 DIAGNOSIS — E890 Postprocedural hypothyroidism: Secondary | ICD-10-CM | POA: Insufficient documentation

## 2021-05-31 DIAGNOSIS — Z20822 Contact with and (suspected) exposure to covid-19: Secondary | ICD-10-CM | POA: Insufficient documentation

## 2021-05-31 DIAGNOSIS — J101 Influenza due to other identified influenza virus with other respiratory manifestations: Secondary | ICD-10-CM | POA: Insufficient documentation

## 2021-05-31 DIAGNOSIS — Z85850 Personal history of malignant neoplasm of thyroid: Secondary | ICD-10-CM | POA: Insufficient documentation

## 2021-05-31 DIAGNOSIS — N183 Chronic kidney disease, stage 3 unspecified: Secondary | ICD-10-CM | POA: Diagnosis not present

## 2021-05-31 DIAGNOSIS — I129 Hypertensive chronic kidney disease with stage 1 through stage 4 chronic kidney disease, or unspecified chronic kidney disease: Secondary | ICD-10-CM | POA: Insufficient documentation

## 2021-05-31 DIAGNOSIS — R519 Headache, unspecified: Secondary | ICD-10-CM | POA: Diagnosis present

## 2021-05-31 LAB — URINALYSIS, ROUTINE W REFLEX MICROSCOPIC
Bilirubin Urine: NEGATIVE
Glucose, UA: NEGATIVE mg/dL
Hgb urine dipstick: NEGATIVE
Ketones, ur: NEGATIVE mg/dL
Leukocytes,Ua: NEGATIVE
Nitrite: NEGATIVE
Protein, ur: NEGATIVE mg/dL
Specific Gravity, Urine: 1.007 (ref 1.005–1.030)
pH: 5 (ref 5.0–8.0)

## 2021-05-31 LAB — RESP PANEL BY RT-PCR (FLU A&B, COVID) ARPGX2
Influenza A by PCR: POSITIVE — AB
Influenza B by PCR: NEGATIVE
SARS Coronavirus 2 by RT PCR: NEGATIVE

## 2021-05-31 MED ORDER — ACETAMINOPHEN 325 MG PO TABS
650.0000 mg | ORAL_TABLET | Freq: Once | ORAL | Status: AC | PRN
  Administered 2021-05-31: 650 mg via ORAL
  Filled 2021-05-31: qty 2

## 2021-05-31 NOTE — Discharge Instructions (Addendum)
Your work-up was reassuring today.  You had a normal heart rhythm and your lungs sounded clear.  I do not have a high suspicion for pneumonia.  You tested positive for the flu today.  You may utilize Delsym for your cough.  Tessalon Perles are also a good option however you do not have to use these if you do not want to.  The flu was treated with over-the-counter medications.  You should not return to work until you have 24 hours without a fever.  The flu is contagious so try to stay away from other people.  I am attaching information about influenza as well as a work note for you.  You may return sooner if you are without a fever.  It was a pleasure to meet you and I hope that you feel better.

## 2021-05-31 NOTE — ED Provider Notes (Signed)
[facility] EMERGENCY DEPT Provider Note   CSN: [id number] Arrival date & time: 05/31/21  1837     History Chief Complaint  Patient presents with   Headache    Rhonda [name] is a 50 y.o. female with a past medical history of thyroid cancer, CKD stage II hypertension and anxiety presenting today with a complaint a headache and URI symptoms.  Patient reports for the past couple days she felt as though her allergies are flaring up.  This morning she woke up with chills, cough, scratchy throat and headache.  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  Reported a temperature of 102 at home and decided to come get checked out.  Spoke with her doctor who told her to use Delsym for her cough.  Denies shortness of breath or chest pain.  Cough is nonproductive.  Patient also reporting urinary frequency over the past few days.  No dysuria or hematuria   Past Medical History:  Diagnosis Date   Anxiety    C1q nephropathy    Cancer ([place])    history of thyroid cancer   Chronic constipation    CKD (chronic kidney disease), stage II nephrologist-  dr Gretchen Short [facility])   secondary to C1q nephropathy/  hypertension   Family history of adverse reaction to anesthesia    father hard to put down for anesthesia   GERD (gastroesophageal reflux disease)    H/O radioactive iodine thyroid ablation    1992  right throid   History of exercise intolerance    01-052011  ETT--  normal w/ no ischemia per cardiologist (dr Aundra Dubin) epic note   History of pulmonary embolus (PE)    07/ 2008-- treated w/ coumadin   History of syncope    recurrent --  workup done per epic -- dx orthostatic hypotension   Hypertension    IBS (irritable bowel syndrome)    Pelvic pain in female    Post-surgical hypothyroidism    1991 left thyroidectomy   Proteinuria    Seasonal and perennial allergic rhinitis    Wears contact lenses      Patient Active Problem List   Diagnosis Date Noted   Bilateral carpal tunnel syndrome 01/07/2021   Flexor carpi radialis tendinitis 01/07/2021   Vitamin D deficiency 05/19/2019   IGT (impaired glucose tolerance) 05/19/2019   Exposure to medical diagnostic radiation 02/09/2017   S/P bilateral salpingo-oophorectomy 04/28/2016   Pelvic adhesions 03/26/2016    Class: Present on Admission   History of pulmonary embolism 08/01/2014   CKD (chronic kidney disease) stage 3, GFR 30-59 ml/min (HCC) 02/27/2014   Hypothyroidism 04/28/2012   Hemorrhage of rectum and anus 02/09/2011   Constipation 02/09/2011   Dyslipidemia 08/26/2009   Backache 02/14/2008   Allergic rhinitis 11/15/2007   GERD 06/07/2007   Anxiety state 04/27/2007   Essential hypertension 04/27/2007   Disorder of kidney and ureter 03/17/2007   EMBOLISM/THROMBOSIS, DEEP VSL PRXML LWR EXTRM 03/16/2007    Past Surgical History:  Procedure Laterality Date   24 HOUR [place] STUDY N/A 11/16/2016   Procedure: 24 HOUR [place] STUDY;  Surgeon: Doran Stabler, MD;  Location: [facility];  Service: Gastroenterology;  Laterality: N/A;   ABDOMINAL HYSTERECTOMY  1998   COLONOSCOPY     CYSTOSCOPY N/A 04/28/2016   Procedure: CYSTOSCOPY;  Surgeon: Arvella Nigh, MD;  Location: [facility];  Service: Gynecology;  Laterality: N/A;   ESOPHAGEAL MANOMETRY N/A 11/16/2016   Procedure: ESOPHAGEAL  MANOMETRY (EM);  Surgeon: Doran Stabler, MD;  Location: [facility];  Service: Gastroenterology;  Laterality: N/A;   ESOPHAGOGASTRODUODENOSCOPY     LAPAROSCOPIC CHOLECYSTECTOMY  1997   LAPAROSCOPY N/A 03/26/2016   Procedure: LAPAROSCOPY DIAGNOSTIC, LYSIS OF ADHESIONS;  Surgeon: Arvella Nigh, MD;  Location: [place];  Service: Gynecology;  Laterality: N/A;   LAPAROTOMY N/A 04/28/2016   Procedure: EXPLORATORY LAPAROTOMY;  Surgeon: Arvella Nigh, MD;  Location: [facility];  Service: Gynecology;  Laterality: N/A;   RENAL BIOPSY      SALPINGOOPHORECTOMY Bilateral 04/28/2016   Procedure: Bilateral OOPHORECTOMY;  Surgeon: Arvella Nigh, MD;  Location: [facility];  Service: Gynecology;  Laterality: Bilateral;   THYROIDECTOMY Left 1991   TRANSTHORACIC ECHOCARDIOGRAM  04/27/2012   normal LV, ef 60-65%/  trivial MR   TUBAL LIGATION  1992     OB History   No obstetric history on file.     Family History  Problem Relation Age of Onset   Diabetes Mother        Pre-diabetic   Hypertension Mother    Hypertension Father    Coronary artery disease Father    Diabetes Father    Stroke Father    Prostate cancer Father    Breast cancer Maternal Aunt    Thyroid disease Maternal Aunt    Colon cancer Neg Hx    Colon polyps Neg Hx    Kidney disease Neg Hx    Esophageal cancer Neg Hx    Gallbladder disease Neg Hx    Stomach cancer Neg Hx    Rectal cancer Neg Hx     Social History   Tobacco Use   Smoking status: Never   Smokeless tobacco: Never  Vaping Use   Vaping Use: Never used  Substance Use Topics   Alcohol use: No    Alcohol/week: 0.0 standard drinks   Drug use: No    Home Medications Prior to Admission medications   Medication Sig Start Date End Date Taking? Authorizing Provider  acyclovir cream (ZOVIRAX) 5 % Apply 1 application topically every 3 (three) hours. Patient not taking: Reported on 03/24/2021 11/13/20   Isaac Bliss, Rayford Halsted, MD  albuterol (VENTOLIN HFA) 108 (90 Base) MCG/ACT inhaler Inhale 2 puffs into the lungs every 6 (six) hours as needed. 12/27/20   Isaac Bliss, Rayford Halsted, MD  ALPRAZolam Duanne Moron) 0.25 MG tablet Take 1 tablet (0.25 mg total) by mouth 2 (two) times daily as needed for anxiety. 12/19/19   Isaac Bliss, Rayford Halsted, MD  atorvastatin (LIPITOR) 40 MG tablet TAKE 1 TABLET(40 MG) BY MOUTH DAILY 02/10/21   Isaac Bliss, Rayford Halsted, MD  benazepril-hydrochlorthiazide (LOTENSIN HCT) 20-25 MG tablet TAKE 1 TABLET BY MOUTH DAILY. FOLLOW UP WITH PRIMARY CARE DOCTOR* 05/13/21   Isaac Bliss, Rayford Halsted, MD  cetirizine (ZYRTEC) 10 MG tablet Take 10 mg by mouth daily.    [provider]  docusate sodium (COLACE) 100 MG capsule Take 100 mg by mouth daily.    [provider]  levothyroxine (SYNTHROID) 100 MCG tablet TAKE 1 TABLET(100 MCG) BY MOUTH DAILY BEFORE BREAKFAST 03/17/21   Elayne Snare, MD  metoprolol succinate (TOPROL-XL) 50 MG 24 hr tablet TAKE 1 TABLET BY MOUTH EVERY DAY IMMEDIATELY FOLLOWING A MEAL 04/18/21   Isaac Bliss, Rayford Halsted, MD  Multiple Vitamins-Minerals (MULTIVITAMIN WITH MINERALS) tablet Take 1 tablet by mouth daily.    [provider]  omeprazole (PRILOSEC) 40 MG capsule TAKE 1 CAPSULE(40 MG) BY MOUTH DAILY 06/21/20  Isaac Bliss, Rayford Halsted, MD  ondansetron (ZOFRAN) 8 MG tablet Take 1 tablet (8 mg total) by mouth every 8 (eight) hours as needed for nausea or vomiting. 05/22/19   Doran Stabler, MD    Allergies    Aspirin, Flagyl [metronidazole], Food, Tramadol, and Dilaudid [hydromorphone]  Review of Systems   Review of Systems  Constitutional:  Positive for chills and fever.  HENT:  Positive for congestion, rhinorrhea and sore throat.   Respiratory:  Positive for cough. Negative for chest tightness and shortness of breath.   Cardiovascular:  Negative for chest pain and palpitations.  Gastrointestinal:  Negative for constipation, diarrhea, nausea and vomiting.  Genitourinary:  Positive for frequency. Negative for dysuria and hematuria.  Allergic/Immunologic: Positive for environmental allergies.  Neurological:  Positive for syncope and headaches. Negative for dizziness.   Physical Exam Updated Vital Signs BP 103/80 (BP Location: Right Arm)   Pulse (!) 106   Temp (!) 100.7 F (38.2 C)   Resp 16   Ht 5\' 6"  (1.676 m)   Wt 93.2 kg   SpO2 95%   BMI 33.18 kg/m   Physical Exam Vitals and nursing note reviewed.  Constitutional:      General: She is not in acute distress.    Appearance: Normal appearance. She is  not ill-appearing.  HENT:     Head: Normocephalic and atraumatic.     Comments: Nasal congestion bilaterally.  Some erythema in right ear canal.  No signs of infection.    Mouth/Throat:     Mouth: Mucous membranes are moist.     Pharynx: Oropharynx is clear.  Eyes:     General: No scleral icterus.    Conjunctiva/sclera: Conjunctivae normal.  Cardiovascular:     Rate and Rhythm: Normal rate and regular rhythm.  Pulmonary:     Effort: Pulmonary effort is normal. No respiratory distress.     Breath sounds: Normal breath sounds. No wheezing.  Abdominal:     Palpations: Abdomen is soft.     Tenderness: There is no abdominal tenderness.  Lymphadenopathy:     Cervical: No cervical adenopathy.  Skin:    General: Skin is warm and dry.     Findings: No rash.  Neurological:     Mental Status: She is alert.  Psychiatric:        Mood and Affect: Mood normal.        Behavior: Behavior normal.    ED Results / Procedures / Treatments   Labs (all labs ordered are listed, but only abnormal results are displayed) Labs Reviewed  RESP PANEL BY RT-PCR (FLU A&B, COVID) ARPGX2 - Abnormal; Notable for the following components:      Result Value   Influenza A by PCR POSITIVE (*)    All other components within normal limits  URINALYSIS, ROUTINE W REFLEX MICROSCOPIC - Abnormal; Notable for the following components:   Color, Urine COLORLESS (*)    APPearance HAZY (*)    All other components within normal limits    EKG None  Radiology No results found.  Procedures Procedures   Medications Ordered in ED Medications  acetaminophen (TYLENOL) tablet 650 mg (650 mg Oral Given 05/31/21 1852)    ED Course  I have reviewed the triage vital signs and the nursing notes.  Pertinent labs & imaging results that were available during my care of the patient were reviewed by me and considered in my medical decision making (see chart for details).    MDM Rules/Calculators/A&P Patient  was evaluated  by me.  She was in no acute distress.  Headache appeared to be in relation to URI.  No neurological deficits.  Patient likely has a viral illness such as COVID-19 or the flu.  She was swabbed for this today.  Patient also complained of urinary frequency so I ordered a urinalysis.  Over-the-counter treatment of upper respiratory infections was discussed at length.  If she is COVID-positive she is not a candidate for Paxlovid due to her kidney disease.    Patient tested positive for influenza A.  These results were shared with the patient and her loved one.  Information about the flu and a work note were attached to the patient's discharge papers.  She did not have a urinary tract infection today.  Ambulatory and stable for discharge with return precautions.  Final Clinical Impression(s) / ED Diagnoses Final diagnoses:  Influenza A    Rx / DC Orders Results and diagnoses were explained to the patient. Return precautions discussed in full. Patient had no additional questions and expressed complete understanding.     Darliss Ridgel 05/31/21 2010    Dorie Rank, MD 06/02/21 1003

## 2021-05-31 NOTE — ED Triage Notes (Signed)
Sore throat yesterday , headache and nasal congestion , dry cough today .

## 2021-06-03 ENCOUNTER — Encounter: Payer: Self-pay | Admitting: Internal Medicine

## 2021-06-04 ENCOUNTER — Ambulatory Visit: Payer: Managed Care, Other (non HMO)

## 2021-06-08 ENCOUNTER — Other Ambulatory Visit: Payer: Self-pay | Admitting: Internal Medicine

## 2021-06-09 ENCOUNTER — Other Ambulatory Visit: Payer: Self-pay | Admitting: Internal Medicine

## 2021-06-13 ENCOUNTER — Ambulatory Visit (INDEPENDENT_AMBULATORY_CARE_PROVIDER_SITE_OTHER): Payer: Managed Care, Other (non HMO) | Admitting: Internal Medicine

## 2021-06-13 ENCOUNTER — Other Ambulatory Visit: Payer: Self-pay

## 2021-06-13 ENCOUNTER — Encounter: Payer: Self-pay | Admitting: Internal Medicine

## 2021-06-13 VITALS — BP 110/80 | HR 85 | Temp 98.3°F | Ht 65.0 in | Wt 210.1 lb

## 2021-06-13 DIAGNOSIS — E890 Postprocedural hypothyroidism: Secondary | ICD-10-CM

## 2021-06-13 DIAGNOSIS — R7302 Impaired glucose tolerance (oral): Secondary | ICD-10-CM | POA: Diagnosis not present

## 2021-06-13 DIAGNOSIS — I10 Essential (primary) hypertension: Secondary | ICD-10-CM | POA: Diagnosis not present

## 2021-06-13 DIAGNOSIS — E785 Hyperlipidemia, unspecified: Secondary | ICD-10-CM

## 2021-06-13 DIAGNOSIS — N183 Chronic kidney disease, stage 3 unspecified: Secondary | ICD-10-CM

## 2021-06-13 DIAGNOSIS — E559 Vitamin D deficiency, unspecified: Secondary | ICD-10-CM | POA: Diagnosis not present

## 2021-06-13 DIAGNOSIS — Z0000 Encounter for general adult medical examination without abnormal findings: Secondary | ICD-10-CM | POA: Diagnosis not present

## 2021-06-13 LAB — CBC WITH DIFFERENTIAL/PLATELET
Basophils Absolute: 0 10*3/uL (ref 0.0–0.1)
Basophils Relative: 0.3 % (ref 0.0–3.0)
Eosinophils Absolute: 0.3 10*3/uL (ref 0.0–0.7)
Eosinophils Relative: 4.4 % (ref 0.0–5.0)
HCT: 36.3 % (ref 36.0–46.0)
Hemoglobin: 11.9 g/dL — ABNORMAL LOW (ref 12.0–15.0)
Lymphocytes Relative: 43.1 % (ref 12.0–46.0)
Lymphs Abs: 2.7 10*3/uL (ref 0.7–4.0)
MCHC: 32.7 g/dL (ref 30.0–36.0)
MCV: 82.8 fl (ref 78.0–100.0)
Monocytes Absolute: 0.7 10*3/uL (ref 0.1–1.0)
Monocytes Relative: 10.8 % (ref 3.0–12.0)
Neutro Abs: 2.6 10*3/uL (ref 1.4–7.7)
Neutrophils Relative %: 41.4 % — ABNORMAL LOW (ref 43.0–77.0)
Platelets: 269 10*3/uL (ref 150.0–400.0)
RBC: 4.39 Mil/uL (ref 3.87–5.11)
RDW: 13.8 % (ref 11.5–15.5)
WBC: 6.2 10*3/uL (ref 4.0–10.5)

## 2021-06-13 LAB — COMPREHENSIVE METABOLIC PANEL
ALT: 16 U/L (ref 0–35)
AST: 18 U/L (ref 0–37)
Albumin: 4.1 g/dL (ref 3.5–5.2)
Alkaline Phosphatase: 96 U/L (ref 39–117)
BUN: 17 mg/dL (ref 6–23)
CO2: 30 mEq/L (ref 19–32)
Calcium: 10 mg/dL (ref 8.4–10.5)
Chloride: 104 mEq/L (ref 96–112)
Creatinine, Ser: 1.27 mg/dL — ABNORMAL HIGH (ref 0.40–1.20)
GFR: 48.67 mL/min — ABNORMAL LOW (ref >60.00)
Glucose, Bld: 97 mg/dL (ref 70–99)
Potassium: 3.8 mEq/L (ref 3.5–5.1)
Sodium: 141 mEq/L (ref 135–145)
Total Bilirubin: 0.4 mg/dL (ref 0.2–1.2)
Total Protein: 8 g/dL (ref 6.0–8.3)

## 2021-06-13 LAB — LIPID PANEL
Cholesterol: 146 mg/dL (ref 0–200)
HDL: 39.4 mg/dL (ref >39.00)
LDL Cholesterol: 91 mg/dL (ref 0–99)
NonHDL: 106.89
Total CHOL/HDL Ratio: 4
Triglycerides: 79 mg/dL (ref 0.0–149.0)
VLDL: 15.8 mg/dL (ref 0.0–40.0)

## 2021-06-13 LAB — VITAMIN D 25 HYDROXY (VIT D DEFICIENCY, FRACTURES): VITD: 23.86 ng/mL — ABNORMAL LOW (ref 30.00–100.00)

## 2021-06-13 LAB — TSH: TSH: 0.74 u[IU]/mL (ref 0.35–5.50)

## 2021-06-13 LAB — HEMOGLOBIN A1C: Hgb A1c MFr Bld: 6.3 % (ref 4.6–6.5)

## 2021-06-13 NOTE — Patient Instructions (Signed)
-  Nice seeing you today!!  -Lab work today; will notify you once results are available.  -Schedule follow up in 6 months. 

## 2021-06-13 NOTE — Progress Notes (Signed)
Established Patient Office Visit     This visit occurred during the SARS-CoV-2 public health emergency.  Safety protocols were in place, including screening questions prior to the visit, additional usage of staff PPE, and extensive cleaning of exam room while observing appropriate contact time as indicated for disinfecting solutions.    CC/Reason for Visit: Annual preventive exam  HPI: Rhonda [name] is a 50 y.o. female who is coming in today for the above mentioned reasons. Past Medical History is significant for: Hypothyroidism on levothyroxine, impaired glucose tolerance, hyperlipidemia, hypertension, stage III chronic kidney disease, vitamin D deficiency and GERD.  She has no acute concerns or complaints today.  She has routine eye and dental care.  She works as a Teacher, early years/pre.  She went to the emergency department 13 days ago with a headache and was diagnosed with flu a.  She has her colonoscopy scheduled in December, mammogram is also scheduled, she follows routinely with GYN.  She is due for her flu and second shingles vaccination.   Past Medical/Surgical History: Past Medical History:  Diagnosis Date   Anxiety    C1q nephropathy    Cancer ([place])    history of thyroid cancer   Chronic constipation    CKD (chronic kidney disease), stage II nephrologist-  dr Gretchen Short [facility])   secondary to C1q nephropathy/  hypertension   Family history of adverse reaction to anesthesia    father hard to put down for anesthesia   GERD (gastroesophageal reflux disease)    H/O radioactive iodine thyroid ablation    1992  right throid   History of exercise intolerance    01-052011  ETT--  normal w/ no ischemia per cardiologist (dr Aundra Dubin) epic note   History of pulmonary embolus (PE)    07/ 2008-- treated w/ coumadin   History of syncope    recurrent --  workup done per epic -- dx orthostatic hypotension   Hypertension    IBS (irritable bowel  syndrome)    Pelvic pain in female    Post-surgical hypothyroidism    1991 left thyroidectomy   Proteinuria    Seasonal and perennial allergic rhinitis    Wears contact lenses     Past Surgical History:  Procedure Laterality Date   55 HOUR [place] STUDY N/A 11/16/2016   Procedure: 24 HOUR PH STUDY;  Surgeon: Doran Stabler, MD;  Location: [facility];  Service: Gastroenterology;  Laterality: N/A;   ABDOMINAL HYSTERECTOMY  1998   COLONOSCOPY     CYSTOSCOPY N/A 04/28/2016   Procedure: CYSTOSCOPY;  Surgeon: Arvella Nigh, MD;  Location: [facility];  Service: Gynecology;  Laterality: N/A;   ESOPHAGEAL MANOMETRY N/A 11/16/2016   Procedure: ESOPHAGEAL MANOMETRY (EM);  Surgeon: Doran Stabler, MD;  [facility];  Service: Gastroenterology;  Laterality: N/A;   ESOPHAGOGASTRODUODENOSCOPY     LAPAROSCOPIC CHOLECYSTECTOMY  1997   LAPAROSCOPY N/A 03/26/2016   Procedure: LAPAROSCOPY DIAGNOSTIC, LYSIS OF ADHESIONS;  Surgeon: Arvella Nigh, MD;  Location: [place];  Service: Gynecology;  Laterality: N/A;   LAPAROTOMY N/A 04/28/2016   Procedure: EXPLORATORY LAPAROTOMY;  Surgeon: Arvella Nigh, MD;  Location: [facility];  Service: Gynecology;  Laterality: N/A;   RENAL BIOPSY     SALPINGOOPHORECTOMY Bilateral 04/28/2016   Procedure: Bilateral OOPHORECTOMY;  Surgeon: Arvella Nigh, MD;  Location: [facility];  Service: Gynecology;  Laterality: Bilateral;   THYROIDECTOMY Left 1991   TRANSTHORACIC ECHOCARDIOGRAM  04/27/2012   normal LV,  ef 60-65%/  trivial MR   TUBAL LIGATION  1992    Social History:  reports that she has never smoked. She has never used smokeless tobacco. She reports that she does not drink alcohol and does not use drugs.  Allergies: Allergies  Allergen Reactions   Aspirin Hives   Flagyl [Metronidazole] Nausea And Vomiting    With oral flagyl.   Has tolerated vaginal flagyl.     Food Other (See Comments)    Lima beans--  Positive allergy test    Tramadol Other (See  Comments)    Headache    Dilaudid [Hydromorphone] Itching    Family History:  Family History  Problem Relation Age of Onset   Diabetes Mother        Pre-diabetic   Hypertension Mother    Hypertension Father    Coronary artery disease Father    Diabetes Father    Stroke Father    Prostate cancer Father    Breast cancer Maternal Aunt    Thyroid disease Maternal Aunt    Colon cancer Neg Hx    Colon polyps Neg Hx    Kidney disease Neg Hx    Esophageal cancer Neg Hx    Gallbladder disease Neg Hx    Stomach cancer Neg Hx    Rectal cancer Neg Hx      Current Outpatient Medications:    acyclovir cream (ZOVIRAX) 5 %, Apply 1 application topically every 3 (three) hours., Disp: 15 g, Rfl: 2   albuterol (VENTOLIN HFA) 108 (90 Base) MCG/ACT inhaler, Inhale 2 puffs into the lungs every 6 (six) hours as needed., Disp: 6.7 g, Rfl: 1   ALPRAZolam (XANAX) 0.25 MG tablet, Take 1 tablet (0.25 mg total) by mouth 2 (two) times daily as needed for anxiety., Disp: 60 tablet, Rfl: 0   atorvastatin (LIPITOR) 40 MG tablet, TAKE 1 TABLET(40 MG) BY MOUTH DAILY, Disp: 90 tablet, Rfl: 1   benazepril-hydrochlorthiazide (LOTENSIN HCT) 20-25 MG tablet, TAKE 1 TABLET BY MOUTH DAILY. FOLLOW UP WITH PRIMARY CARE DOCTOR*, Disp: 30 tablet, Rfl: 0   cetirizine (ZYRTEC) 10 MG tablet, Take 10 mg by mouth daily., Disp: , Rfl:    docusate sodium (COLACE) 100 MG capsule, Take 100 mg by mouth daily., Disp: , Rfl:    levothyroxine (SYNTHROID) 100 MCG tablet, TAKE 1 TABLET(100 MCG) BY MOUTH DAILY BEFORE BREAKFAST, Disp: 90 tablet, Rfl: 3   metoprolol succinate (TOPROL-XL) 50 MG 24 hr tablet, TAKE 1 TABLET BY MOUTH EVERY DAY IMMEDIATELY FOLLOWING A MEAL, Disp: 90 tablet, Rfl: 1   Multiple Vitamins-Minerals (MULTIVITAMIN WITH MINERALS) tablet, Take 1 tablet by mouth daily., Disp: , Rfl:    ondansetron (ZOFRAN) 8 MG tablet, Take 1 tablet (8 mg total) by mouth every 8 (eight) hours as needed for nausea or vomiting., Disp: 30  tablet, Rfl: 0  Review of Systems:  Constitutional: Denies fever, chills, diaphoresis, appetite change and fatigue.  HEENT: Denies photophobia, eye pain, redness, hearing loss, ear pain, congestion, sore throat, rhinorrhea, sneezing, mouth sores, trouble swallowing, neck pain, neck stiffness and tinnitus.   Respiratory: Denies SOB, DOE, cough, chest tightness,  and wheezing.   Cardiovascular: Denies chest pain, palpitations and leg swelling.  Gastrointestinal: Denies nausea, vomiting, abdominal pain, diarrhea, constipation, blood in stool and abdominal distention.  Genitourinary: Denies dysuria, urgency, frequency, hematuria, flank pain and difficulty urinating.  Endocrine: Denies: hot or cold intolerance, sweats, changes in hair or nails, polyuria, polydipsia. Musculoskeletal: Denies myalgias, back pain, joint swelling, arthralgias and gait  problem.  Skin: Denies pallor, rash and wound.  Neurological: Denies dizziness, seizures, syncope, weakness, light-headedness, numbness and headaches.  Hematological: Denies adenopathy. Easy bruising, personal or family bleeding history  Psychiatric/Behavioral: Denies suicidal ideation, mood changes, confusion, nervousness, sleep disturbance and agitation    Physical Exam: Vitals:   06/13/21 1014  BP: 110/80  Pulse: 85  Temp: 98.3 F (36.8 C)  TempSrc: Oral  SpO2: 95%  Weight: 210 lb 1.6 oz (95.3 kg)  Height: 5\' 5"  (1.651 m)    Body mass index is 34.96 kg/m.   Constitutional: NAD, calm, comfortable Eyes: PERRL, lids and conjunctivae normal ENMT: Mucous membranes are moist. Posterior pharynx clear of any exudate or lesions. Normal dentition. Tympanic membrane is pearly white, no erythema or bulging. Neck: normal, supple, no masses, no thyromegaly Respiratory: clear to auscultation bilaterally, no wheezing, no crackles. Normal respiratory effort. No accessory muscle use.  Cardiovascular: Regular rate and rhythm, no murmurs / rubs / gallops.  No extremity edema. 2+ pedal pulses. No carotid bruits.  Abdomen: no tenderness, no masses palpated. No hepatosplenomegaly. Bowel sounds positive.  Musculoskeletal: no clubbing / cyanosis. No joint deformity upper and lower extremities. Good ROM, no contractures. Normal muscle tone.  Skin: no rashes, lesions, ulcers. No induration Neurologic: CN 2-12 grossly intact. Sensation intact, DTR normal. Strength 5/5 in all 4.  Psychiatric: Normal judgment and insight. Alert and oriented x 3. Normal mood.    Impression and Plan:  Encounter for preventive health examination -Recommend routine eye and dental care. -Immunizations: She is due for flu vaccine and second shingles but she would like to defer both today as she was recently diagnosed with the flu and is still recovering. -Healthy lifestyle discussed in detail. -Labs to be updated today. -Colon cancer screening: Initial screening colonoscopy scheduled for December. -Breast cancer screening: Already scheduled for next month, last was in September 2021 -Cervical cancer screening: Done through GYN, Dr. Renold Don -Lung cancer screening: Not applicable -Prostate cancer screening: Not applicable -DEXA: Not applicable   Essential hypertension  - Plan: CBC with Differential/Platelet, Comprehensive metabolic panel, Comprehensive metabolic panel, CBC with Differential/Platelet -  Dyslipidemia  - Plan: Lipid panel, Lipid panel  Postoperative hypothyroidism  - Plan: TSH, TSH  IGT (impaired glucose tolerance)  - Plan: Hemoglobin A1c, Hemoglobin A1c -Last A1c was 5.9 in October 2021.  Vitamin D deficiency  - Plan: VITAMIN D 25 Hydroxy (Vit-D Deficiency, Fractures), VITAMIN D 25 Hydroxy (Vit-D Deficiency, Fractures)  Stage 3 chronic kidney disease, unspecified whether stage 3a or 3b CKD ([place]) -Check kidney function today, she follows routinely with her nephrologist.    Patient Instructions  -Nice seeing you today!!  -Lab work today; will  notify you once results are available.  -Schedule follow up in 6 months.    Lelon Frohlich, MD [facility] at [facility]

## 2021-06-17 ENCOUNTER — Encounter: Payer: Self-pay | Admitting: Internal Medicine

## 2021-06-17 ENCOUNTER — Other Ambulatory Visit: Payer: Self-pay | Admitting: Internal Medicine

## 2021-06-17 DIAGNOSIS — E559 Vitamin D deficiency, unspecified: Secondary | ICD-10-CM

## 2021-06-17 MED ORDER — VITAMIN D (ERGOCALCIFEROL) 1.25 MG (50000 UNIT) PO CAPS: 50000.0000 [IU] | ORAL_CAPSULE | ORAL | 0 refills | Status: DC

## 2021-06-20 ENCOUNTER — Other Ambulatory Visit: Payer: Self-pay | Admitting: Internal Medicine

## 2021-06-20 DIAGNOSIS — E559 Vitamin D deficiency, unspecified: Secondary | ICD-10-CM

## 2021-07-04 ENCOUNTER — Ambulatory Visit
Admission: RE | Admit: 2021-07-04 | Discharge: 2021-07-04 | Disposition: A | Discharge status: Home / Self Care | Payer: BC Managed Care – PPO | Source: Ambulatory Visit | Attending: Internal Medicine | Admitting: Internal Medicine

## 2021-07-04 ENCOUNTER — Other Ambulatory Visit: Payer: Self-pay

## 2021-07-04 DIAGNOSIS — Z1231 Encounter for screening mammogram for malignant neoplasm of breast: Secondary | ICD-10-CM

## 2021-07-08 ENCOUNTER — Other Ambulatory Visit: Payer: Self-pay | Admitting: Internal Medicine

## 2021-07-22 ENCOUNTER — Ambulatory Visit (AMBULATORY_SURGERY_CENTER): Payer: BC Managed Care – PPO

## 2021-07-22 ENCOUNTER — Other Ambulatory Visit: Payer: Self-pay

## 2021-07-22 ENCOUNTER — Encounter: Payer: Self-pay | Admitting: Internal Medicine

## 2021-07-22 VITALS — Ht 65.0 in | Wt 215.0 lb

## 2021-07-22 DIAGNOSIS — Z1211 Encounter for screening for malignant neoplasm of colon: Secondary | ICD-10-CM

## 2021-07-22 NOTE — Progress Notes (Signed)
    Patient's pre-visit was done today over the phone with the patient   Name,DOB and address verified.   Patient denies any allergies to Eggs and Soy.  Patient denies any problems with anesthesia/sedation. Patient denies taking diet pills or blood thinners.  Denies atrial flutter or atrial fib Denies chronic constipation--goes every other day with stool softner No home Oxygen.   Packet of Prep instructions mailed to patient including a copy of a consent form-pt is aware.  Patient understands to call us back with any questions or concerns.  Patient is aware of our care-partner policy and RAQTM-22 safety protocol.    Has suprep on hand.

## 2021-07-24 ENCOUNTER — Encounter (HOSPITAL_COMMUNITY): Payer: Self-pay | Admitting: Internal Medicine

## 2021-07-24 ENCOUNTER — Other Ambulatory Visit: Payer: Self-pay

## 2021-07-31 ENCOUNTER — Other Ambulatory Visit (HOSPITAL_COMMUNITY)
Admission: RE | Admit: 2021-07-31 | Discharge: 2021-07-31 | Disposition: A | Discharge status: Home / Self Care | Payer: Managed Care, Other (non HMO) | Source: Ambulatory Visit | Attending: Internal Medicine | Admitting: Internal Medicine

## 2021-07-31 ENCOUNTER — Ambulatory Visit (INDEPENDENT_AMBULATORY_CARE_PROVIDER_SITE_OTHER): Payer: Managed Care, Other (non HMO) | Admitting: Internal Medicine

## 2021-07-31 VITALS — BP 120/84 | HR 70 | Temp 98.2°F | Wt 202.9 lb

## 2021-07-31 DIAGNOSIS — N898 Other specified noninflammatory disorders of vagina: Secondary | ICD-10-CM | POA: Diagnosis present

## 2021-07-31 DIAGNOSIS — R300 Dysuria: Secondary | ICD-10-CM

## 2021-07-31 DIAGNOSIS — Z23 Encounter for immunization: Secondary | ICD-10-CM | POA: Diagnosis not present

## 2021-07-31 LAB — POCT URINALYSIS DIPSTICK
Bilirubin, UA: NEGATIVE
Blood, UA: NEGATIVE
Glucose, UA: NEGATIVE
Ketones, UA: NEGATIVE
Leukocytes, UA: NEGATIVE
Nitrite, UA: NEGATIVE
Protein, UA: NEGATIVE
Spec Grav, UA: 1.02 (ref 1.010–1.025)
Urobilinogen, UA: 0.2 E.U./dL
pH, UA: 6 (ref 5.0–8.0)

## 2021-07-31 MED ORDER — FLUCONAZOLE 150 MG PO TABS: 150.0000 mg | ORAL_TABLET | Freq: Once | ORAL | 0 refills | Status: AC

## 2021-07-31 NOTE — Progress Notes (Signed)
Acute office Visit     This visit occurred during the SARS-CoV-2 public health emergency.  Safety protocols were in place, including screening questions prior to the visit, additional usage of staff PPE, and extensive cleaning of exam room while observing appropriate contact time as indicated for disinfecting solutions.    CC/Reason for Visit: Vaginal itching/burning, urinary frequency  HPI: Rhonda [name] is a 50 y.o. female who is coming in today for the above mentioned reasons.  For approximately 1 week she has been experiencing vaginal itching and burning.  She has also noticed a cloudy urine and increased urine frequency.  She is monogamous with her husband.  No concern for STDs.  Past Medical/Surgical History: Past Medical History:  Diagnosis Date   Anxiety    C1q nephropathy    Cancer ([place])    history of thyroid cancer   Chronic constipation    CKD (chronic kidney disease), stage II nephrologist-  dr Gretchen Short [facility])   secondary to C1q nephropathy/  hypertension   Family history of adverse reaction to anesthesia    father hard to put down for anesthesia   GERD (gastroesophageal reflux disease)    H/O radioactive iodine thyroid ablation    1992  right throid   History of exercise intolerance    01-052011  ETT--  normal w/ no ischemia per cardiologist (dr Aundra Dubin) epic note   History of pulmonary embolus (PE)    07/ 2008-- treated w/ coumadin   History of syncope    recurrent --  workup done per epic -- dx orthostatic hypotension   Hyperlipidemia    Hypertension    IBS (irritable bowel syndrome)    Neuromuscular disorder ([facility])    carpal tunnel   Pelvic pain in female    Post-surgical hypothyroidism    1991 left thyroidectomy   Pre-diabetes    Proteinuria    Seasonal and perennial allergic rhinitis    Wears contact lenses    Wears glasses     Past Surgical History:  Procedure Laterality Date   9 HOUR [place] STUDY N/A  11/16/2016   Procedure: 24 HOUR PH STUDY;  Surgeon: Doran Stabler, MD;  [facility];  Service: Gastroenterology;  Laterality: N/A;   ABDOMINAL HYSTERECTOMY  1998   COLONOSCOPY     CYSTOSCOPY N/A 04/28/2016   Procedure: CYSTOSCOPY;  Surgeon: Arvella Nigh, MD;  Location: [facility];  Service: Gynecology;  Laterality: N/A;   ESOPHAGEAL MANOMETRY N/A 11/16/2016   Procedure: ESOPHAGEAL MANOMETRY (EM);  Surgeon: Doran Stabler, MD;  [facility];  Service: Gastroenterology;  Laterality: N/A;   ESOPHAGOGASTRODUODENOSCOPY     LAPAROSCOPIC CHOLECYSTECTOMY  1997   LAPAROSCOPY N/A 03/26/2016   Procedure: LAPAROSCOPY DIAGNOSTIC, LYSIS OF ADHESIONS;  Surgeon: Arvella Nigh, MD;  Location: [place];  Service: Gynecology;  Laterality: N/A;   LAPAROTOMY N/A 04/28/2016   Procedure: EXPLORATORY LAPAROTOMY;  Surgeon: Arvella Nigh, MD;  Location: [facility];  Service: Gynecology;  Laterality: N/A;   RENAL BIOPSY     SALPINGOOPHORECTOMY Bilateral 04/28/2016   Procedure: Bilateral OOPHORECTOMY;  Surgeon: Arvella Nigh, MD;  Location: [facility];  Service: Gynecology;  Laterality: Bilateral;   THYROIDECTOMY Left 1991   TRANSTHORACIC ECHOCARDIOGRAM  04/27/2012   normal LV, ef 60-65%/  trivial MR   TUBAL LIGATION  1992    Social History:  reports that she has never smoked. She has never used smokeless tobacco. She reports that she does not drink  alcohol and does not use drugs.  Allergies: Allergies  Allergen Reactions   Aspirin Hives   Flagyl [Metronidazole] Nausea And Vomiting    With oral flagyl.   Has tolerated vaginal flagyl.     Food Other (See Comments)    Lima beans--  Positive allergy test    Tramadol Other (See Comments)    Headache    Dilaudid [Hydromorphone] Itching    Family History:  Family History  Problem Relation Age of Onset   Diabetes Mother        Pre-diabetic   Hypertension Mother    Hypertension Father    Coronary artery disease Father     Diabetes Father    Stroke Father    Prostate cancer Father    Breast cancer Maternal Aunt    Thyroid disease Maternal Aunt    Colon cancer Neg Hx    Colon polyps Neg Hx    Kidney disease Neg Hx    Esophageal cancer Neg Hx    Gallbladder disease Neg Hx    Stomach cancer Neg Hx    Rectal cancer Neg Hx      Current Outpatient Medications:    acyclovir cream (ZOVIRAX) 5 %, Apply 1 application topically every 3 (three) hours. (Patient taking differently: Apply 1 application topically daily as needed (outbreaks).), Disp: 15 g, Rfl: 2   albuterol (VENTOLIN HFA) 108 (90 Base) MCG/ACT inhaler, Inhale 2 puffs into the lungs every 6 (six) hours as needed., Disp: 6.7 g, Rfl: 1   ALPRAZolam (XANAX) 0.25 MG tablet, Take 1 tablet (0.25 mg total) by mouth 2 (two) times daily as needed for anxiety., Disp: 60 tablet, Rfl: 0   atorvastatin (LIPITOR) 40 MG tablet, TAKE 1 TABLET(40 MG) BY MOUTH DAILY, Disp: 90 tablet, Rfl: 1   benazepril-hydrochlorthiazide (LOTENSIN HCT) 20-25 MG tablet, Take 1 tablet by mouth daily., Disp: 90 tablet, Rfl: 1   cetirizine (ZYRTEC) 10 MG tablet, Take 10 mg by mouth daily., Disp: , Rfl:    docusate sodium (COLACE) 100 MG capsule, Take 100 mg by mouth daily., Disp: , Rfl:    fluconazole (DIFLUCAN) 150 MG tablet, Take 1 tablet (150 mg total) by mouth once for 1 dose., Disp: 1 tablet, Rfl: 0   levothyroxine (SYNTHROID) 100 MCG tablet, TAKE 1 TABLET(100 MCG) BY MOUTH DAILY BEFORE BREAKFAST, Disp: 90 tablet, Rfl: 3   metoprolol succinate (TOPROL-XL) 50 MG 24 hr tablet, TAKE 1 TABLET BY MOUTH EVERY DAY IMMEDIATELY FOLLOWING A MEAL (Patient taking differently: Take 50 mg by mouth every evening.), Disp: 90 tablet, Rfl: 1   Multiple Vitamins-Minerals (MULTIVITAMIN WITH MINERALS) tablet, Take 1 tablet by mouth daily., Disp: , Rfl:    ondansetron (ZOFRAN) 8 MG tablet, Take 1 tablet (8 mg total) by mouth every 8 (eight) hours as needed for nausea or vomiting., Disp: 30 tablet, Rfl: 0    SUPREP BOWEL PREP KIT 17.5-3.13-1.6 GM/177ML SOLN, SMARTSIG:1 Kit(s) By Mouth Once, Disp: , Rfl:    Vitamin D, Ergocalciferol, (DRISDOL) 1.25 MG (50000 UNIT) CAPS capsule, Take 1 capsule (50,000 Units total) by mouth every 7 (seven) days for 12 doses., Disp: 12 capsule, Rfl: 0  Review of Systems:  Constitutional: Denies fever, chills, diaphoresis, appetite change and fatigue.  HEENT: Denies photophobia, eye pain, redness, hearing loss, ear pain, congestion, sore throat, rhinorrhea, sneezing, mouth sores, trouble swallowing, neck pain, neck stiffness and tinnitus.   Respiratory: Denies SOB, DOE, cough, chest tightness,  and wheezing.   Cardiovascular: Denies chest pain, palpitations and leg  swelling.  Gastrointestinal: Denies nausea, vomiting, abdominal pain, diarrhea, constipation, blood in stool and abdominal distention.  Genitourinary: Denies  urgency, hematuria, flank pain and difficulty urinating.  Endocrine: Denies: hot or cold intolerance, sweats, changes in hair or nails, polyuria, polydipsia. Musculoskeletal: Denies myalgias, back pain, joint swelling, arthralgias and gait problem.  Skin: Denies pallor, rash and wound.  Neurological: Denies dizziness, seizures, syncope, weakness, light-headedness, numbness and headaches.  Hematological: Denies adenopathy. Easy bruising, personal or family bleeding history  Psychiatric/Behavioral: Denies suicidal ideation, mood changes, confusion, nervousness, sleep disturbance and agitation    Physical Exam: Vitals:   07/31/21 1030  BP: 120/84  Pulse: 70  Temp: 98.2 F (36.8 C)  TempSrc: Oral  SpO2: 99%  Weight: 202 lb 14.4 oz (92 kg)    Body mass index is 33.76 kg/m.   Constitutional: NAD, calm, comfortable Eyes: PERRL, lids and conjunctivae normal ENMT: Mucous membranes are moist.  GU: Vaginal introitus is erythematous, copious amount of thick, white discharge Psychiatric: Normal judgment and insight. Alert and oriented x 3. Normal  mood.    Impression and Plan:  Dysuria - Plan: POCT urinalysis dipstick  Vaginal itching - Plan: fluconazole (DIFLUCAN) 150 MG tablet, Cervicovaginal ancillary only, PAP [[place]]  -Urine dipstick is negative for signs of infection. -Wet prep performed today and will send out, given discharge seems characteristic of yeast infection, I will go ahead and send in a tablet of fluconazole for her to take while we await results.  Needs flu shot  - Plan: Flu Vaccine QUAD 6+ mos PF IM (Fluarix Quad PF)  Time spent: 31 minutes reviewing chart, interviewing and examining patient and formulating plan of care.   Patient Instructions  -Nice seeing you today!!  -Take fluconazole 150 mg once.  -Flu vaccine today    Lelon Frohlich, MD [facility] at [facility]

## 2021-07-31 NOTE — Patient Instructions (Signed)
-  Nice seeing you today!!  -Take fluconazole 150 mg once.  -Flu vaccine today

## 2021-08-01 ENCOUNTER — Other Ambulatory Visit: Payer: Self-pay | Admitting: Internal Medicine

## 2021-08-01 DIAGNOSIS — B9689 Other specified bacterial agents as the cause of diseases classified elsewhere: Secondary | ICD-10-CM

## 2021-08-01 DIAGNOSIS — N760 Acute vaginitis: Secondary | ICD-10-CM

## 2021-08-01 LAB — CYTOLOGY - PAP
Adequacy: ABSENT
Comment: NEGATIVE
Diagnosis: NEGATIVE
High risk HPV: NEGATIVE

## 2021-08-01 LAB — CERVICOVAGINAL ANCILLARY ONLY
Bacterial Vaginitis (gardnerella): POSITIVE — AB
Candida Glabrata: NEGATIVE
Candida Vaginitis: NEGATIVE
Chlamydia: NEGATIVE
Comment: NEGATIVE
Comment: NEGATIVE
Comment: NEGATIVE
Comment: NEGATIVE
Comment: NEGATIVE
Comment: NORMAL
Neisseria Gonorrhea: NEGATIVE
Trichomonas: NEGATIVE

## 2021-08-01 MED ORDER — METRONIDAZOLE 500 MG PO TABS: 2000.0000 mg | ORAL_TABLET | Freq: Once | ORAL | 0 refills | Status: AC

## 2021-08-01 MED ORDER — CLINDAMYCIN HCL 300 MG PO CAPS: 300.0000 mg | ORAL_CAPSULE | Freq: Two times a day (BID) | ORAL | 0 refills | Status: DC

## 2021-08-04 ENCOUNTER — Ambulatory Visit: Payer: Managed Care, Other (non HMO) | Admitting: Neurology

## 2021-08-04 NOTE — Anesthesia Preprocedure Evaluation (Addendum)
Anesthesia Evaluation  Patient identified by MRN, date of birth, ID band Patient awake    Reviewed: Allergy & Precautions, NPO status , Patient's Chart, lab work & pertinent test results, reviewed documented beta blocker date and time   History of Anesthesia Complications Negative for: history of anesthetic complications  Airway Mallampati: III  TM Distance: >3 FB Neck ROM: Full    Dental no notable dental hx.    Pulmonary neg pulmonary ROS,    Pulmonary exam normal        Cardiovascular hypertension, Pt. on medications and Pt. on home beta blockers Normal cardiovascular exam     Neuro/Psych Anxiety negative neurological ROS     GI/Hepatic Neg liver ROS, GERD  ,  Endo/Other  Hypothyroidism   Renal/GU Renal InsufficiencyRenal disease  negative genitourinary   Musculoskeletal negative musculoskeletal ROS (+)   Abdominal   Peds  Hematology negative hematology ROS (+)   Anesthesia Other Findings Day of surgery medications reviewed with patient.  Reproductive/Obstetrics negative OB ROS                            Anesthesia Physical Anesthesia Plan  ASA: 2  Anesthesia Plan: MAC   Post-op Pain Management: Minimal or no pain anticipated   Induction:   PONV Risk Score and Plan: Treatment may vary due to age or medical condition and Propofol infusion  Airway Management Planned: Natural Airway and Nasal Cannula  Additional Equipment: None  Intra-op Plan:   Post-operative Plan:   Informed Consent: I have reviewed the patients History and Physical, chart, labs and discussed the procedure including the risks, benefits and alternatives for the proposed anesthesia with the patient or authorized representative who has indicated his/her understanding and acceptance.       Plan Discussed with: CRNA  Anesthesia Plan Comments:        Anesthesia Quick Evaluation

## 2021-08-05 ENCOUNTER — Encounter (HOSPITAL_COMMUNITY): Payer: Self-pay | Admitting: Internal Medicine

## 2021-08-05 ENCOUNTER — Ambulatory Visit (HOSPITAL_COMMUNITY)
Admission: RE | Admit: 2021-08-05 | Discharge: 2021-08-05 | Disposition: A | Discharge status: Home / Self Care | Payer: Managed Care, Other (non HMO) | Attending: Internal Medicine | Admitting: Internal Medicine

## 2021-08-05 ENCOUNTER — Other Ambulatory Visit: Payer: Self-pay | Admitting: Internal Medicine

## 2021-08-05 ENCOUNTER — Encounter (HOSPITAL_COMMUNITY)
Admission: RE | Disposition: A | Discharge status: Home / Self Care | Payer: Self-pay | Source: Home / Self Care | Attending: Internal Medicine

## 2021-08-05 ENCOUNTER — Other Ambulatory Visit: Payer: Self-pay

## 2021-08-05 ENCOUNTER — Ambulatory Visit (HOSPITAL_COMMUNITY): Payer: Managed Care, Other (non HMO) | Admitting: Anesthesiology

## 2021-08-05 DIAGNOSIS — K633 Ulcer of intestine: Secondary | ICD-10-CM | POA: Diagnosis not present

## 2021-08-05 DIAGNOSIS — E559 Vitamin D deficiency, unspecified: Secondary | ICD-10-CM

## 2021-08-05 DIAGNOSIS — Z1211 Encounter for screening for malignant neoplasm of colon: Secondary | ICD-10-CM | POA: Diagnosis present

## 2021-08-05 DIAGNOSIS — K648 Other hemorrhoids: Secondary | ICD-10-CM | POA: Insufficient documentation

## 2021-08-05 HISTORY — PX: COLONOSCOPY WITH PROPOFOL: SHX5780

## 2021-08-05 HISTORY — DX: Presence of spectacles and contact lenses: Z97.3

## 2021-08-05 HISTORY — DX: Prediabetes: R73.03

## 2021-08-05 SURGERY — COLONOSCOPY WITH PROPOFOL
Anesthesia: Monitor Anesthesia Care

## 2021-08-05 MED ORDER — PROPOFOL 500 MG/50ML IV EMUL
INTRAVENOUS | Status: AC
  Filled 2021-08-05: qty 50

## 2021-08-05 MED ORDER — PROPOFOL 500 MG/50ML IV EMUL
INTRAVENOUS | Status: DC | PRN
  Administered 2021-08-05: 125 ug/kg/min via INTRAVENOUS

## 2021-08-05 MED ORDER — PROPOFOL 1000 MG/100ML IV EMUL
INTRAVENOUS | Status: AC
  Filled 2021-08-05: qty 200

## 2021-08-05 MED ORDER — LACTATED RINGERS IV SOLN: INTRAVENOUS | Status: DC | PRN

## 2021-08-05 MED ORDER — PROPOFOL 10 MG/ML IV BOLUS
INTRAVENOUS | Status: DC | PRN
  Administered 2021-08-05: 20 mg via INTRAVENOUS
  Administered 2021-08-05 (×2): 30 mg via INTRAVENOUS

## 2021-08-05 NOTE — H&P (Signed)
GASTROENTEROLOGY PROCEDURE H&P NOTE   Primary Care Physician: Isaac Bliss, Rayford Halsted, MD    Reason for Procedure:   Colon cancer screening  Plan:    Colonoscopy  Patient is appropriate for endoscopic procedure(s) in the hospital setting.  The nature of the procedure, as well as the risks, benefits, and alternatives were carefully and thoroughly reviewed with the patient. Ample time for discussion and questions allowed. The patient understood, was satisfied, and agreed to proceed.     HPI: Rhonda [name] is a 50 y.o. female who presents for colonoscopy for colon cancer screening. Denies blood in stools, changes in bowel habits. Denies fam hx of colon cancer. Denies use of blood thinners. Last colonoscopy 12/2010 with internal/external hemorrhoids.  Past Medical History:  Diagnosis Date   Anxiety    C1q nephropathy    Cancer ([place])    history of thyroid cancer   Chronic constipation    CKD (chronic kidney disease), stage II nephrologist-  dr Gretchen Short [facility])   secondary to C1q nephropathy/  hypertension   Family history of adverse reaction to anesthesia    father hard to put down for anesthesia   GERD (gastroesophageal reflux disease)    H/O radioactive iodine thyroid ablation    1992  right throid   History of exercise intolerance    01-052011  ETT--  normal w/ no ischemia per cardiologist (dr Aundra Dubin) epic note   History of pulmonary embolus (PE)    07/ 2008-- treated w/ coumadin   History of syncope    recurrent --  workup done per epic -- dx orthostatic hypotension   Hyperlipidemia    Hypertension    IBS (irritable bowel syndrome)    Neuromuscular disorder (HCC)    carpal tunnel   Pelvic pain in female    Post-surgical hypothyroidism    1991 left thyroidectomy   Pre-diabetes    Proteinuria    Seasonal and perennial allergic rhinitis    Wears contact lenses    Wears glasses     Past Surgical History:  Procedure  Laterality Date   72 HOUR [place] STUDY N/A 11/16/2016   Procedure: 24 HOUR PH STUDY;  Surgeon: Doran Stabler, MD;  Location: [facility];  Service: Gastroenterology;  Laterality: N/A;   ABDOMINAL HYSTERECTOMY  1998   COLONOSCOPY     CYSTOSCOPY N/A 04/28/2016   Procedure: CYSTOSCOPY;  Surgeon: Arvella Nigh, MD;  Location: [facility];  Service: Gynecology;  Laterality: N/A;   ESOPHAGEAL MANOMETRY N/A 11/16/2016   Procedure: ESOPHAGEAL MANOMETRY (EM);  Surgeon: Doran Stabler, MD;  [facility];  Service: Gastroenterology;  Laterality: N/A;   ESOPHAGOGASTRODUODENOSCOPY     LAPAROSCOPIC CHOLECYSTECTOMY  1997   LAPAROSCOPY N/A 03/26/2016   Procedure: LAPAROSCOPY DIAGNOSTIC, LYSIS OF ADHESIONS;  Surgeon: Arvella Nigh, MD;  Location: [facility];  Service: Gynecology;  Laterality: N/A;   LAPAROTOMY N/A 04/28/2016   Procedure: EXPLORATORY LAPAROTOMY;  Surgeon: Arvella Nigh, MD;  Location: [facility];  Service: Gynecology;  Laterality: N/A;   RENAL BIOPSY     SALPINGOOPHORECTOMY Bilateral 04/28/2016   Procedure: Bilateral OOPHORECTOMY;  Surgeon: Arvella Nigh, MD;  Location: [facility];  Service: Gynecology;  Laterality: Bilateral;   THYROIDECTOMY Left 1991   TRANSTHORACIC ECHOCARDIOGRAM  04/27/2012   normal LV, ef 60-65%/  trivial MR   TUBAL LIGATION  1992    Prior to Admission medications   Medication Sig Start Date End Date Taking? Authorizing Provider  atorvastatin (LIPITOR) 40  MG tablet TAKE 1 TABLET(40 MG) BY MOUTH DAILY 02/10/21  Yes Isaac Bliss, Rayford Halsted, MD  benazepril-hydrochlorthiazide (LOTENSIN HCT) 20-25 MG tablet Take 1 tablet by mouth daily. 07/08/21  Yes Isaac Bliss, Rayford Halsted, MD  cetirizine (ZYRTEC) 10 MG tablet Take 10 mg by mouth daily.   Yes [provider]  clindamycin (CLEOCIN) 300 MG capsule Take 1 capsule (300 mg total) by mouth in the morning and at bedtime. 08/01/21  Yes Isaac Bliss, Rayford Halsted, MD  docusate sodium (COLACE) 100 MG  capsule Take 100 mg by mouth daily.   Yes [provider]  levothyroxine (SYNTHROID) 100 MCG tablet TAKE 1 TABLET(100 MCG) BY MOUTH DAILY BEFORE BREAKFAST 03/17/21  Yes Elayne Snare, MD  metoprolol succinate (TOPROL-XL) 50 MG 24 hr tablet TAKE 1 TABLET BY MOUTH EVERY DAY IMMEDIATELY FOLLOWING A MEAL Patient taking differently: Take 50 mg by mouth every evening. 04/18/21  Yes Isaac Bliss, Rayford Halsted, MD  omeprazole (PRILOSEC) 40 MG capsule Take 40 mg by mouth daily.   Yes [provider]  SUPREP BOWEL PREP KIT 17.5-3.13-1.6 GM/177ML SOLN SMARTSIG:1 Kit(s) By Mouth Once 02/28/21  Yes [provider]  Vitamin D, Ergocalciferol, (DRISDOL) 1.25 MG (50000 UNIT) CAPS capsule Take 1 capsule (50,000 Units total) by mouth every 7 (seven) days for 12 doses. 06/17/21 09/03/21 Yes Erline Hau, MD  acyclovir cream (ZOVIRAX) 5 % Apply 1 application topically every 3 (three) hours. Patient taking differently: Apply 1 application topically daily as needed (outbreaks). 11/13/20   Isaac Bliss, Rayford Halsted, MD  albuterol (VENTOLIN HFA) 108 (90 Base) MCG/ACT inhaler Inhale 2 puffs into the lungs every 6 (six) hours as needed. 12/27/20   Isaac Bliss, Rayford Halsted, MD  ALPRAZolam Duanne Moron) 0.25 MG tablet Take 1 tablet (0.25 mg total) by mouth 2 (two) times daily as needed for anxiety. 12/19/19   Isaac Bliss, Rayford Halsted, MD  Multiple Vitamins-Minerals (MULTIVITAMIN WITH MINERALS) tablet Take 1 tablet by mouth daily. Patient not taking: Reported on 08/05/2021    [provider]  ondansetron (ZOFRAN) 8 MG tablet Take 1 tablet (8 mg total) by mouth every 8 (eight) hours as needed for nausea or vomiting. 05/22/19   Doran Stabler, MD    No current facility-administered medications for this encounter.    Allergies as of 03/04/2021 - Review Complete 03/04/2021  Allergen Reaction Noted   Aspirin Hives    Flagyl [metronidazole] Nausea And Vomiting 06/20/2013   Food Other (See  Comments) 10/02/2011   Tramadol Other (See Comments) 10/02/2011   Dilaudid [hydromorphone] Itching 07/01/2014    Family History  Problem Relation Age of Onset   Diabetes Mother        Pre-diabetic   Hypertension Mother    Hypertension Father    Coronary artery disease Father    Diabetes Father    Stroke Father    Prostate cancer Father    Breast cancer Maternal Aunt    Thyroid disease Maternal Aunt    Colon cancer Neg Hx    Colon polyps Neg Hx    Kidney disease Neg Hx    Esophageal cancer Neg Hx    Gallbladder disease Neg Hx    Stomach cancer Neg Hx    Rectal cancer Neg Hx     Social History   Socioeconomic History   Marital status: Married    Spouse name: Not on file   Number of children: 2   Years of education: Not on file   Highest  education level: 12th grade  Occupational History   Occupation: NURSING ASSISTANT    Employer: [employer]  Tobacco Use   Smoking status: Never   Smokeless tobacco: Never  Vaping Use   Vaping Use: Never used  Substance and Sexual Activity   Alcohol use: No    Alcohol/week: 0.0 standard drinks   Drug use: No   Sexual activity: Yes    Birth control/protection: Surgical  Other Topics Concern   Not on file  Social History Narrative   Not on file   Social Determinants of Health   Financial Resource Strain: Low Risk    Difficulty of Paying Living Expenses: Not hard at all  Food Insecurity: No Food Insecurity   Worried About Charity fundraiser in the Last Year: Never true   [place] in the Last Year: Never true  Transportation Needs: No Transportation Needs   Lack of Transportation (Medical): No   Lack of Transportation (Non-Medical): No  Physical Activity: Insufficiently Active   Days of Exercise per Week: 5 days   Minutes of Exercise per Session: 10 min  Stress: Stress Concern Present   Feeling of Stress : To some extent  Social Connections: Moderately Integrated   Frequency of Communication with Friends  and Family: More than three times a week   Frequency of Social Gatherings with Friends and Family: Once a week   Attends Religious Services: More than 4 times per year   Active Member of [employer] or Organizations: No   Attends Music therapist: Not on file   Marital Status: Married  Human resources officer Violence: Not on file    Physical Exam: Vital signs in last 24 hours: BP 134/77    Pulse 88    Temp 98.3 F (36.8 C) (Oral)    Resp 15    Ht '5\' 5"'  (1.651 m)    Wt 91.6 kg    SpO2 100%    BMI 33.61 kg/m  GEN: NAD EYE: Sclerae anicteric ENT: MMM CV: Non-tachycardic Pulm: No increased work of breathing GI: Soft, NT/ND NEURO:  Alert & Oriented   Christia Reading, MD [facility]  08/05/2021 7:56 AM

## 2021-08-05 NOTE — Discharge Instructions (Signed)

## 2021-08-05 NOTE — Transfer of Care (Signed)
Immediate Anesthesia Transfer of Care Note  Patient: Katharin Schneider  Procedure(s) Performed: COLONOSCOPY WITH PROPOFOL  Patient Location: Endoscopy Unit  Anesthesia Type:MAC  Level of Consciousness: awake, alert  and oriented  Airway & Oxygen Therapy: Patient Spontanous Breathing and Patient connected to face mask oxygen  Post-op Assessment: Report given to RN and Post -op Vital signs reviewed and stable  Post vital signs: Reviewed and stable  Last Vitals:  Vitals Value Taken Time  BP 114/72   Temp    Pulse 68 08/05/21 0826  Resp 15 08/05/21 0826  SpO2 100 % 08/05/21 0826  Vitals shown include unvalidated device data.  Last Pain:  Vitals:   08/05/21 0654  TempSrc: Oral  PainSc: 0-No pain         Complications: No notable events documented.

## 2021-08-05 NOTE — Op Note (Addendum)
[facility] Patient Name: Rhonda [name] Procedure Date: 08/05/2021 MRN: [id number] Attending MD: Georgian Co ,  Date of Birth: 1971-02-02 CSN: [id number] Age: 50 Admit Type: Outpatient Procedure:                Colonoscopy Indications:              Screening for colorectal malignant neoplasm Providers:                Adline Mango" Berna Bue, RN, Frazier Richards,                            Technician Referring MD:             Rayford Halsted. Isaac Bliss, Estill Cotta. Loletha Carrow, MD Medicines:                Monitored Anesthesia Care Complications:            No immediate complications. Estimated Blood Loss:     Estimated blood loss: none. Procedure:                Pre-Anesthesia Assessment:                           - Prior to the procedure, a History and Physical                            was performed, and patient medications and                            allergies were reviewed. The patient's tolerance of                            previous anesthesia was also reviewed. The risks                            and benefits of the procedure and the sedation                            options and risks were discussed with the patient.                            All questions were answered, and informed consent                            was obtained. Prior Anticoagulants: The patient has                            taken no previous anticoagulant or antiplatelet                            agents. ASA Grade Assessment: III - A patient with                            severe systemic disease. After reviewing the risks  and benefits, the patient was deemed in                            satisfactory condition to undergo the procedure.                           After obtaining informed consent, the colonoscope                            was passed under direct vision. Throughout the                            procedure, the patient's  blood pressure, pulse, and                            oxygen saturations were monitored continuously. The                            CF-HQ190L ([id number]) Olympus colonoscope was                            introduced through the anus and advanced to the the                            terminal ileum. The colonoscopy was performed                            without difficulty. The patient tolerated the                            procedure well. The quality of the bowel                            preparation was good. The terminal ileum, ileocecal                            valve, appendiceal orifice, and rectum were                            photographed. Scope In: 8:07:36 AM Scope Out: 8:20:10 AM Scope Withdrawal Time: 0 hours 10 minutes 15 seconds  Total Procedure Duration: 0 hours 12 minutes 34 seconds  Findings:      The terminal ileum appeared normal.      A few scattered non-bleeding erosions were found in the cecum. No       stigmata of recent bleeding were seen.      Non-bleeding internal hemorrhoids were found during retroflexion. Impression:               - The examined portion of the ileum was normal.                           - A few erosions in the cecum.                           -  Non-bleeding internal hemorrhoids.                           - No specimens collected. Moderate Sedation:      Not Applicable - Patient had care per Anesthesia. Recommendation:           - Discharge patient to home (with escort).                           - Repeat colonoscopy in 10 years for screening                            purposes.                           - The findings and recommendations were discussed                            with the patient. Procedure Code(s):        --- Professional ---                           P5465, Colorectal cancer screening; colonoscopy on                            individual not meeting criteria for high risk Diagnosis Code(s):        --- Professional  ---                           Z12.11, Encounter for screening for malignant                            neoplasm of colon                           K64.8, Other hemorrhoids                           K63.3, Ulcer of intestine CPT copyright 2019 American Medical Association. All rights reserved. The codes documented in this report are preliminary and upon coder review may  be revised to meet current compliance requirements. [address]Christia Reading,  08/05/2021 8:25:24 AM Number of Addenda: 0

## 2021-08-05 NOTE — Anesthesia Postprocedure Evaluation (Signed)
Anesthesia Post Note  Patient: Rhonda [name]  Procedure(s) Performed: COLONOSCOPY WITH PROPOFOL     Patient location during evaluation: PACU Anesthesia Type: MAC Level of consciousness: awake and alert and oriented Pain management: pain level controlled Vital Signs Assessment: post-procedure vital signs reviewed and stable Respiratory status: spontaneous breathing, nonlabored ventilation and respiratory function stable Cardiovascular status: blood pressure returned to baseline Postop Assessment: no apparent nausea or vomiting Anesthetic complications: no   No notable events documented.  Last Vitals:  Vitals:   08/05/21 0827 08/05/21 0830  BP: 126/86 121/86  Pulse: 68 63  Resp: 15 18  Temp: 36.4 C   SpO2: 100% 100%    Last Pain:  Vitals:   08/05/21 0827  TempSrc: Oral  PainSc: 0-No pain                 Marthenia Rolling

## 2021-08-06 ENCOUNTER — Encounter (HOSPITAL_COMMUNITY): Payer: Self-pay | Admitting: Internal Medicine

## 2021-08-07 ENCOUNTER — Other Ambulatory Visit: Payer: Self-pay | Admitting: Internal Medicine

## 2021-08-07 DIAGNOSIS — E785 Hyperlipidemia, unspecified: Secondary | ICD-10-CM

## 2021-08-11 ENCOUNTER — Encounter: Payer: Self-pay | Admitting: Internal Medicine

## 2021-08-12 ENCOUNTER — Ambulatory Visit: Payer: Managed Care, Other (non HMO) | Admitting: Neurology

## 2021-08-14 ENCOUNTER — Ambulatory Visit (INDEPENDENT_AMBULATORY_CARE_PROVIDER_SITE_OTHER): Payer: Managed Care, Other (non HMO) | Admitting: Internal Medicine

## 2021-08-14 ENCOUNTER — Encounter: Payer: Self-pay | Admitting: Internal Medicine

## 2021-08-14 VITALS — BP 124/78 | HR 63 | Temp 98.6°F | Ht 65.0 in | Wt 202.4 lb

## 2021-08-14 DIAGNOSIS — M5442 Lumbago with sciatica, left side: Secondary | ICD-10-CM

## 2021-08-14 MED ORDER — PREDNISONE 10 MG (21) PO TBPK: ORAL_TABLET | ORAL | 0 refills | Status: DC

## 2021-08-14 NOTE — Patient Instructions (Signed)
-  Nice seeing you today!!  -For you back: daily stretches, prednisone as directed, Icing for 15 mins 3 times a day. Ibuprofen as needed. If not better in about 3-4 weeks, can send to physical therapy.

## 2021-08-14 NOTE — Progress Notes (Signed)
Acute office Visit     This visit occurred during the SARS-CoV-2 public health emergency.  Safety protocols were in place, including screening questions prior to the visit, additional usage of staff PPE, and extensive cleaning of exam room while observing appropriate contact time as indicated for disinfecting solutions.    CC/Reason for Visit: Lower back pain and left leg pain  HPI: Rhonda [name] is a 50 y.o. female who is coming in today for the above mentioned reasons.  For the past week she has been having left lower back pain that radiates down her buttock and posterior thigh, sometimes extending into the calf.  She has had episodes of sciatica in the past and feels like this is the same.  Past Medical/Surgical History: Past Medical History:  Diagnosis Date   Anxiety    C1q nephropathy    Cancer ([place])    history of thyroid cancer   Chronic constipation    CKD (chronic kidney disease), stage II nephrologist-  dr Gretchen Short [facility])   secondary to C1q nephropathy/  hypertension   Family history of adverse reaction to anesthesia    father hard to put down for anesthesia   GERD (gastroesophageal reflux disease)    H/O radioactive iodine thyroid ablation    1992  right throid   History of exercise intolerance    01-052011  ETT--  normal w/ no ischemia per cardiologist (dr Aundra Dubin) epic note   History of pulmonary embolus (PE)    07/ 2008-- treated w/ coumadin   History of syncope    recurrent --  workup done per epic -- dx orthostatic hypotension   Hyperlipidemia    Hypertension    IBS (irritable bowel syndrome)    Neuromuscular disorder (HCC)    carpal tunnel   Pelvic pain in female    Post-surgical hypothyroidism    1991 left thyroidectomy   Pre-diabetes    Proteinuria    Seasonal and perennial allergic rhinitis    Wears contact lenses    Wears glasses     Past Surgical History:  Procedure Laterality Date   71 HOUR [place]  STUDY N/A 11/16/2016   Procedure: 24 HOUR PH STUDY;  Surgeon: Doran Stabler, MD;  [facility];  Service: Gastroenterology;  Laterality: N/A;   ABDOMINAL HYSTERECTOMY  1998   COLONOSCOPY     COLONOSCOPY WITH PROPOFOL N/A 08/05/2021   Procedure: COLONOSCOPY WITH PROPOFOL;  Surgeon: Sharyn Creamer, MD;  [facility];  Service: Gastroenterology;  Laterality: N/A;   CYSTOSCOPY N/A 04/28/2016   Procedure: CYSTOSCOPY;  Surgeon: Arvella Nigh, MD;  Location: [facility];  Service: Gynecology;  Laterality: N/A;   ESOPHAGEAL MANOMETRY N/A 11/16/2016   Procedure: ESOPHAGEAL MANOMETRY (EM);  Surgeon: Doran Stabler, MD;  [facility];  Service: Gastroenterology;  Laterality: N/A;   ESOPHAGOGASTRODUODENOSCOPY     LAPAROSCOPIC CHOLECYSTECTOMY  1997   LAPAROSCOPY N/A 03/26/2016   Procedure: LAPAROSCOPY DIAGNOSTIC, LYSIS OF ADHESIONS;  Surgeon: Arvella Nigh, MD;  Location: [facility];  Service: Gynecology;  Laterality: N/A;   LAPAROTOMY N/A 04/28/2016   Procedure: EXPLORATORY LAPAROTOMY;  Surgeon: Arvella Nigh, MD;  Location: [facility];  Service: Gynecology;  Laterality: N/A;   RENAL BIOPSY     SALPINGOOPHORECTOMY Bilateral 04/28/2016   Procedure: Bilateral OOPHORECTOMY;  Surgeon: Arvella Nigh, MD;  Location: [facility];  Service: Gynecology;  Laterality: Bilateral;   THYROIDECTOMY Left 1991   TRANSTHORACIC ECHOCARDIOGRAM  04/27/2012   normal  LV, ef 60-65%/  trivial MR   TUBAL LIGATION  1992    Social History:  reports that she has never smoked. She has never used smokeless tobacco. She reports that she does not drink alcohol and does not use drugs.  Allergies: Allergies  Allergen Reactions   Aspirin Hives   Flagyl [Metronidazole] Nausea And Vomiting    With oral flagyl.   Has tolerated vaginal flagyl.     Food Other (See Comments)    Lima beans--  Positive allergy test    Tramadol Other (See Comments)    Headache    Dilaudid [Hydromorphone] Itching     Family History:  Family History  Problem Relation Age of Onset   Diabetes Mother        Pre-diabetic   Hypertension Mother    Hypertension Father    Coronary artery disease Father    Diabetes Father    Stroke Father    Prostate cancer Father    Breast cancer Maternal Aunt    Thyroid disease Maternal Aunt    Colon cancer Neg Hx    Colon polyps Neg Hx    Kidney disease Neg Hx    Esophageal cancer Neg Hx    Gallbladder disease Neg Hx    Stomach cancer Neg Hx    Rectal cancer Neg Hx      Current Outpatient Medications:    acyclovir cream (ZOVIRAX) 5 %, Apply 1 application topically every 3 (three) hours. (Patient taking differently: Apply 1 application topically daily as needed (outbreaks).), Disp: 15 g, Rfl: 2   albuterol (VENTOLIN HFA) 108 (90 Base) MCG/ACT inhaler, Inhale 2 puffs into the lungs every 6 (six) hours as needed., Disp: 6.7 g, Rfl: 1   ALPRAZolam (XANAX) 0.25 MG tablet, Take 1 tablet (0.25 mg total) by mouth 2 (two) times daily as needed for anxiety., Disp: 60 tablet, Rfl: 0   atorvastatin (LIPITOR) 40 MG tablet, TAKE 1 TABLET(40 MG) BY MOUTH DAILY, Disp: 90 tablet, Rfl: 1   benazepril-hydrochlorthiazide (LOTENSIN HCT) 20-25 MG tablet, Take 1 tablet by mouth daily., Disp: 90 tablet, Rfl: 1   cetirizine (ZYRTEC) 10 MG tablet, Take 10 mg by mouth daily., Disp: , Rfl:    clindamycin (CLEOCIN) 300 MG capsule, Take 1 capsule (300 mg total) by mouth in the morning and at bedtime., Disp: 14 capsule, Rfl: 0   docusate sodium (COLACE) 100 MG capsule, Take 100 mg by mouth daily., Disp: , Rfl:    levothyroxine (SYNTHROID) 100 MCG tablet, TAKE 1 TABLET(100 MCG) BY MOUTH DAILY BEFORE BREAKFAST, Disp: 90 tablet, Rfl: 3   metoprolol succinate (TOPROL-XL) 50 MG 24 hr tablet, TAKE 1 TABLET BY MOUTH EVERY DAY IMMEDIATELY FOLLOWING A MEAL (Patient taking differently: Take 50 mg by mouth every evening.), Disp: 90 tablet, Rfl: 1   Multiple Vitamins-Minerals (MULTIVITAMIN WITH MINERALS)  tablet, Take 1 tablet by mouth daily., Disp: , Rfl:    omeprazole (PRILOSEC) 40 MG capsule, Take 40 mg by mouth daily., Disp: , Rfl:    ondansetron (ZOFRAN) 8 MG tablet, Take 1 tablet (8 mg total) by mouth every 8 (eight) hours as needed for nausea or vomiting., Disp: 30 tablet, Rfl: 0   predniSONE (STERAPRED UNI-PAK 21 TAB) 10 MG (21) TBPK tablet, Take as directed, Disp: 21 tablet, Rfl: 0   SUPREP BOWEL PREP KIT 17.5-3.13-1.6 GM/177ML SOLN, SMARTSIG:1 Kit(s) By Mouth Once, Disp: , Rfl:    Vitamin D, Ergocalciferol, (DRISDOL) 1.25 MG (50000 UNIT) CAPS capsule, TAKE 1 CAPSULE BY MOUTH  EVERY 7 DAYS FOR 12 DOSES, Disp: 12 capsule, Rfl: 0  Review of Systems:  Constitutional: Denies fever, chills, diaphoresis, appetite change and fatigue.  HEENT: Denies photophobia, eye pain, redness, hearing loss, ear pain, congestion, sore throat, rhinorrhea, sneezing, mouth sores, trouble swallowing, neck pain, neck stiffness and tinnitus.   Respiratory: Denies SOB, DOE, cough, chest tightness,  and wheezing.   Cardiovascular: Denies chest pain, palpitations and leg swelling.  Gastrointestinal: Denies nausea, vomiting, abdominal pain, diarrhea, constipation, blood in stool and abdominal distention.  Genitourinary: Denies dysuria, urgency, frequency, hematuria, flank pain and difficulty urinating.  Endocrine: Denies: hot or cold intolerance, sweats, changes in hair or nails, polyuria, polydipsia. Musculoskeletal: Denies  joint swelling, arthralgias. Skin: Denies pallor, rash and wound.  Neurological: Denies dizziness, seizures, syncope, weakness, light-headedness, numbness and headaches.  Hematological: Denies adenopathy. Easy bruising, personal or family bleeding history  Psychiatric/Behavioral: Denies suicidal ideation, mood changes, confusion, nervousness, sleep disturbance and agitation    Physical Exam: Vitals:   08/14/21 0921  BP: 124/78  Pulse: 63  Temp: 98.6 F (37 C)  TempSrc: Oral  SpO2: 98%   Weight: 202 lb 6.4 oz (91.8 kg)  Height: '5\' 5"'  (1.651 m)    Body mass index is 33.68 kg/m.   Constitutional: NAD, calm, comfortable Eyes: PERRL, lids and conjunctivae normal, wears corrective lenses ENMT: Mucous membranes are moist.  Psychiatric: Normal judgment and insight. Alert and oriented x 3. Normal mood.    Impression and Plan:  Acute left-sided low back pain with left-sided sciatica  - Plan: predniSONE (STERAPRED UNI-PAK 21 TAB) 10 MG (21) TBPK tablet -Have advised daily stretches, as needed NSAIDs, icing, will send prednisone taper.  Can consider physical therapy if no improvement in 3 to 4 weeks.  Time spent: 21 minutes reviewing chart, interviewing and examining patient and formulating plan of care.   Patient Instructions  -Nice seeing you today!!  -For you back: daily stretches, prednisone as directed, Icing for 15 mins 3 times a day. Ibuprofen as needed. If not better in about 3-4 weeks, can send to physical therapy.    Lelon Frohlich, MD [facility]

## 2021-08-15 ENCOUNTER — Telehealth: Payer: Self-pay | Admitting: Internal Medicine

## 2021-08-15 NOTE — Telephone Encounter (Signed)
Patient called because Dr.Hernandez does not show up for her insurance, [facility]. Insurance stated that Dr.Hernandez will need to add her name and credentials to their database so she can verified. After that Dr.Hernandez's name can be added to patients card as her PCP.    Patient would like a callback later for update on the situation so she can keep in contact with insurance    Please advise

## 2021-09-10 NOTE — Telephone Encounter (Signed)
Called an left VM to updated patient that credentialing has fixed the below.

## 2021-09-25 NOTE — Progress Notes (Signed)
I, Wendy Poet, LAT, ATC, am serving as scribe for Dr. Lynne Leader.  Rhonda [name] is a 51 y.o. female who presents to [place] at [facility] today for R wrist pain. 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 She was last seen by Dr. 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  She wears a thumb spica splint when not working.  She was advised to con't watchful waiting and Voltaren gel.  Today, pt reports R wrist started hurting about 3 weeks ago. MOI: Pt was twisting something at work and felt a pop in her R wrist. Pt locates pain to ulnar styloid process and across R wrist.  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   Exam:  BP 140/86    Pulse 67    Ht 5\' 5"  (1.651 m)    Wt 203 lb 12.8 oz (92.4 kg)    SpO2 97%    BMI 33.91 kg/m  General: Well Developed, well nourished, and in no acute distress.   MSK: Right wrist normal-appearing Tender palpation at TFCC and ulnar styloid. Normal wrist motion for pain with extension and ulnar deviation. Pulses cap refill and sensation are intact distally.    Lab and Radiology Results  Procedure: Real-time Ultrasound Guided Injection of right wrist joint dorsal approach Device: Philips Affiniti 50G Images permanently stored and available for review in PACS Ultrasound evaluation prior to injection reveals Degenerative appearing TFCC.  Area of maximum tenderness present at TFCC. Mild hypoechoic fluid collecting at ECU tendon however tendon is intact.  Verbal informed consent obtained.  Discussed risks and benefits of procedure. Warned about infection bleeding damage to structures skin hypopigmentation and fat atrophy among others. Patient expresses  understanding and agreement Time-out conducted.   Noted no overlying erythema, induration, or other signs of local infection.   Skin prepped in a sterile fashion.   Local anesthesia: Topical Ethyl chloride.   With sterile technique and under real time ultrasound guidance: 40 mg of Kenalog and 1 mL of lidocaine injected into wrist joint. Fluid seen entering the joint capsule.   Completed without difficulty   Pain immediately resolved suggesting accurate placement of the medication.   Advised to call if fevers/chills, erythema, induration, drainage, or persistent bleeding.   Images permanently stored and available for review in the ultrasound unit.  Impression: Technically successful ultrasound guided injection.    X-ray images right wrist obtained today personally and independently interpreted Mild DJD first [place].  No acute fractures in the wrist.  No severe DJD around the TFCC Await formal radiology review     Assessment and Plan: 51 y.o. female with right wrist pain occurring with wrist motion thought to be possible TFCC tear.  Plan for steroid injection to the wrist joint and wrist bracing.  Also recommend Voltaren gel.  Additionally recommend some home exercise programs.  Check back in about 6 weeks.  If not improved consider MRI arthrogram if needed.  This would be helpful for surgical planning.   PDMP not reviewed this encounter. Orders Placed This Encounter  Procedures   [place] LIMITED JOINT SPACE STRUCTURES UP RIGHT(NO LINKED CHARGES)    Standing Status:  Future    Number of Occurrences:   1    Standing Expiration Date:   03/26/2022    Order Specific Question:   Reason for Exam (SYMPTOM  OR DIAGNOSIS REQUIRED)    Answer:   right wrist pain    Order Specific Question:   Preferred imaging location?    Answer:   [facility]   DG Wrist Complete Right    Standing Status:   Future    Number of Occurrences:   1    Standing Expiration Date:   09/26/2022     Order Specific Question:   Reason for Exam (SYMPTOM  OR DIAGNOSIS REQUIRED)    Answer:   right wrist pain    Order Specific Question:   Preferred imaging location?    Answer:   [facility]    Order Specific Question:   Is patient pregnant?    Answer:   No   No orders of the defined types were placed in this encounter.    Discussed warning signs or symptoms. Please see discharge instructions. Patient expresses understanding.   The above documentation has been reviewed and is accurate and complete Lynne Leader, M.D.

## 2021-09-26 ENCOUNTER — Other Ambulatory Visit: Payer: Self-pay

## 2021-09-26 ENCOUNTER — Ambulatory Visit (INDEPENDENT_AMBULATORY_CARE_PROVIDER_SITE_OTHER): Payer: Managed Care, Other (non HMO)

## 2021-09-26 ENCOUNTER — Ambulatory Visit: Payer: Self-pay

## 2021-09-26 ENCOUNTER — Ambulatory Visit: Payer: Managed Care, Other (non HMO) | Admitting: Family Medicine

## 2021-09-26 VITALS — BP 140/86 | HR 67 | Ht 65.0 in | Wt 203.8 lb

## 2021-09-26 DIAGNOSIS — M25531 Pain in right wrist: Secondary | ICD-10-CM

## 2021-09-26 NOTE — Patient Instructions (Addendum)
Thank you for coming in today.   You received an injection today. Seek immediate medical attention if the joint becomes red, extremely painful, or is oozing fluid.   Please get an Xray today before you leave   Wear the wrist brace  Please use Voltaren gel (Generic Diclofenac Gel) up to 4x daily for pain as needed.  This is available over-the-counter as both the name brand Voltaren gel and the generic diclofenac gel.   Recheck back in 6 weeks

## 2021-09-29 NOTE — Progress Notes (Signed)
Right wrist x-ray shows mild arthritis at the base of the thumb.  Otherwise the wrist look okay.

## 2021-10-06 ENCOUNTER — Other Ambulatory Visit: Payer: Self-pay | Admitting: Internal Medicine

## 2021-10-06 ENCOUNTER — Encounter: Payer: Self-pay | Admitting: Internal Medicine

## 2021-10-06 ENCOUNTER — Ambulatory Visit: Payer: Managed Care, Other (non HMO) | Admitting: Internal Medicine

## 2021-10-06 VITALS — BP 110/80 | HR 55 | Temp 98.2°F | Wt 203.8 lb

## 2021-10-06 DIAGNOSIS — E559 Vitamin D deficiency, unspecified: Secondary | ICD-10-CM | POA: Diagnosis not present

## 2021-10-06 DIAGNOSIS — R000 Tachycardia, unspecified: Secondary | ICD-10-CM | POA: Diagnosis not present

## 2021-10-06 DIAGNOSIS — R002 Palpitations: Secondary | ICD-10-CM | POA: Diagnosis not present

## 2021-10-06 DIAGNOSIS — R42 Dizziness and giddiness: Secondary | ICD-10-CM | POA: Diagnosis not present

## 2021-10-06 DIAGNOSIS — R7302 Impaired glucose tolerance (oral): Secondary | ICD-10-CM

## 2021-10-06 LAB — CBC WITH DIFFERENTIAL/PLATELET
Basophils Absolute: 0 K/uL (ref 0.0–0.1)
Basophils Relative: 0.5 % (ref 0.0–3.0)
Eosinophils Absolute: 0.3 K/uL (ref 0.0–0.7)
Eosinophils Relative: 4 % (ref 0.0–5.0)
HCT: 36.6 % (ref 36.0–46.0)
Hemoglobin: 12.2 g/dL (ref 12.0–15.0)
Lymphocytes Relative: 40.1 % (ref 12.0–46.0)
Lymphs Abs: 2.6 K/uL (ref 0.7–4.0)
MCHC: 33.2 g/dL (ref 30.0–36.0)
MCV: 82.8 fl (ref 78.0–100.0)
Monocytes Absolute: 0.5 K/uL (ref 0.1–1.0)
Monocytes Relative: 8.1 % (ref 3.0–12.0)
Neutro Abs: 3 K/uL (ref 1.4–7.7)
Neutrophils Relative %: 47.3 % (ref 43.0–77.0)
Platelets: 232 K/uL (ref 150.0–400.0)
RBC: 4.41 Mil/uL (ref 3.87–5.11)
RDW: 14.5 % (ref 11.5–15.5)
WBC: 6.4 K/uL (ref 4.0–10.5)

## 2021-10-06 LAB — HEMOGLOBIN A1C: Hgb A1c MFr Bld: 6.2 % (ref 4.6–6.5)

## 2021-10-06 LAB — COMPREHENSIVE METABOLIC PANEL
ALT: 14 U/L (ref 0–35)
AST: 15 U/L (ref 0–37)
Albumin: 4.2 g/dL (ref 3.5–5.2)
Alkaline Phosphatase: 99 U/L (ref 39–117)
BUN: 31 mg/dL — ABNORMAL HIGH (ref 6–23)
CO2: 34 mEq/L — ABNORMAL HIGH (ref 19–32)
Calcium: 10.6 mg/dL — ABNORMAL HIGH (ref 8.4–10.5)
Chloride: 103 mEq/L (ref 96–112)
Creatinine, Ser: 1.35 mg/dL — ABNORMAL HIGH (ref 0.40–1.20)
GFR: 45.13 mL/min — ABNORMAL LOW (ref >60.00)
Glucose, Bld: 94 mg/dL (ref 70–99)
Potassium: 4.2 mEq/L (ref 3.5–5.1)
Sodium: 139 mEq/L (ref 135–145)
Total Bilirubin: 0.4 mg/dL (ref 0.2–1.2)
Total Protein: 8.2 g/dL (ref 6.0–8.3)

## 2021-10-06 LAB — VITAMIN D 25 HYDROXY (VIT D DEFICIENCY, FRACTURES): VITD: 55.98 ng/mL (ref 30.00–100.00)

## 2021-10-06 LAB — MAGNESIUM: Magnesium: 1.4 mg/dL — ABNORMAL LOW (ref 1.5–2.5)

## 2021-10-06 LAB — TSH: TSH: 1.38 u[IU]/mL (ref 0.35–5.50)

## 2021-10-06 MED ORDER — MAGNESIUM OXIDE 400 MG PO CAPS: 1.0000 | ORAL_CAPSULE | Freq: Three times a day (TID) | ORAL | 0 refills | Status: AC

## 2021-10-06 NOTE — Patient Instructions (Signed)
-  Nice seeing you today!!  -Lab work today; will notify you once results are available.  -Referral to cardiology placed today.

## 2021-10-06 NOTE — Progress Notes (Signed)
Acute office Visit     This visit occurred during the SARS-CoV-2 public health emergency.  Safety protocols were in place, including screening questions prior to the visit, additional usage of staff PPE, and extensive cleaning of exam room while observing appropriate contact time as indicated for disinfecting solutions.    CC/Reason for Visit: Palpitations, lightheadedness  HPI: Rhonda [name] is a 51 y.o. female who is coming in today for the above mentioned reasons. Past Medical History is significant for: Hypothyroidism, impaired glucose tolerance, hyperlipidemia, hypertension, stage III chronic kidney disease, GERD and vitamin D deficiency.  For about 2 weeks she has been having a feeling of heart racing and lightheadedness that occurs about 2-3 times a week.  This will last a few minutes only.  She has not had an increase in caffeine consumption.  She wonders if it might be due to anxiety.  She denies frank chest pain, shortness of breath or dyspnea on exertion.  She does not own a smart watch.   Past Medical/Surgical History: Past Medical History:  Diagnosis Date   Anxiety    C1q nephropathy    Cancer ([place])    history of thyroid cancer   Chronic constipation    CKD (chronic kidney disease), stage II nephrologist-  dr Gretchen Short [facility])   secondary to C1q nephropathy/  hypertension   Family history of adverse reaction to anesthesia    father hard to put down for anesthesia   GERD (gastroesophageal reflux disease)    H/O radioactive iodine thyroid ablation    1992  right throid   History of exercise intolerance    [id number]  ETT--  normal w/ no ischemia per cardiologist (dr Aundra Dubin) epic note   History of pulmonary embolus (PE)    07/ 2008-- treated w/ coumadin   History of syncope    recurrent --  workup done per epic -- dx orthostatic hypotension   Hyperlipidemia    Hypertension    IBS (irritable bowel syndrome)    Neuromuscular  disorder ([facility])    carpal tunnel   Pelvic pain in female    Post-surgical hypothyroidism    1991 left thyroidectomy   Pre-diabetes    Proteinuria    Seasonal and perennial allergic rhinitis    Wears contact lenses    Wears glasses     Past Surgical History:  Procedure Laterality Date   8 HOUR [place] STUDY N/A 11/16/2016   Procedure: 24 HOUR PH STUDY;  Surgeon: Doran Stabler, MD;  [facility]: [facility];  Service: Gastroenterology;  Laterality: N/A;   ABDOMINAL HYSTERECTOMY  1998   COLONOSCOPY     COLONOSCOPY WITH PROPOFOL N/A 08/05/2021   Procedure: COLONOSCOPY WITH PROPOFOL;  Surgeon: Sharyn Creamer, MD;  [facility]: [facility];  Service: Gastroenterology;  Laterality: N/A;   CYSTOSCOPY N/A 04/28/2016   Procedure: CYSTOSCOPY;  Surgeon: Arvella Nigh, MD;  Location: [facility];  Service: Gynecology;  Laterality: N/A;   ESOPHAGEAL MANOMETRY N/A 11/16/2016   Procedure: ESOPHAGEAL MANOMETRY (EM);  Surgeon: Doran Stabler, MD;  [facility];  Service: Gastroenterology;  Laterality: N/A;   ESOPHAGOGASTRODUODENOSCOPY     LAPAROSCOPIC CHOLECYSTECTOMY  1997   LAPAROSCOPY N/A 03/26/2016   Procedure: LAPAROSCOPY DIAGNOSTIC, LYSIS OF ADHESIONS;  Surgeon: Arvella Nigh, MD;  Location: [facility];  Service: Gynecology;  Laterality: N/A;   LAPAROTOMY N/A 04/28/2016   Procedure: EXPLORATORY LAPAROTOMY;  Surgeon: Arvella Nigh, MD;  Location: [facility];  Service: Gynecology;  Laterality: N/A;   RENAL BIOPSY     SALPINGOOPHORECTOMY Bilateral 04/28/2016   Procedure: Bilateral OOPHORECTOMY;  Surgeon: Arvella Nigh, MD;  Location: [facility];  Service: Gynecology;  Laterality: Bilateral;   THYROIDECTOMY Left 1991   TRANSTHORACIC ECHOCARDIOGRAM  04/27/2012   normal LV, ef 60-65%/  trivial MR   TUBAL LIGATION  1992    Social History:  reports that she has never smoked. She has never used smokeless tobacco. She reports that she does not drink alcohol and does not use  drugs.  Allergies: Allergies  Allergen Reactions   Aspirin Hives   Flagyl [Metronidazole] Nausea And Vomiting    With oral flagyl.   Has tolerated vaginal flagyl.     Food Other (See Comments)    Lima beans--  Positive allergy test    Tramadol Other (See Comments)    Headache    Dilaudid [Hydromorphone] Itching    Family History:  Family History  Problem Relation Age of Onset   Diabetes Mother        Pre-diabetic   Hypertension Mother    Hypertension Father    Coronary artery disease Father    Diabetes Father    Stroke Father    Prostate cancer Father    Breast cancer Maternal Aunt    Thyroid disease Maternal Aunt    Colon cancer Neg Hx    Colon polyps Neg Hx    Kidney disease Neg Hx    Esophageal cancer Neg Hx    Gallbladder disease Neg Hx    Stomach cancer Neg Hx    Rectal cancer Neg Hx      Current Outpatient Medications:    acyclovir cream (ZOVIRAX) 5 %, Apply 1 application topically every 3 (three) hours. (Patient taking differently: Apply 1 application topically daily as needed (outbreaks).), Disp: 15 g, Rfl: 2   albuterol (VENTOLIN HFA) 108 (90 Base) MCG/ACT inhaler, Inhale 2 puffs into the lungs every 6 (six) hours as needed., Disp: 6.7 g, Rfl: 1   ALPRAZolam (XANAX) 0.25 MG tablet, Take 1 tablet (0.25 mg total) by mouth 2 (two) times daily as needed for anxiety., Disp: 60 tablet, Rfl: 0   atorvastatin (LIPITOR) 40 MG tablet, TAKE 1 TABLET(40 MG) BY MOUTH DAILY, Disp: 90 tablet, Rfl: 1   benazepril-hydrochlorthiazide (LOTENSIN HCT) 20-25 MG tablet, Take 1 tablet by mouth daily., Disp: 90 tablet, Rfl: 1   cetirizine (ZYRTEC) 10 MG tablet, Take 10 mg by mouth daily., Disp: , Rfl:    docusate sodium (COLACE) 100 MG capsule, Take 100 mg by mouth daily., Disp: , Rfl:    levothyroxine (SYNTHROID) 100 MCG tablet, TAKE 1 TABLET(100 MCG) BY MOUTH DAILY BEFORE BREAKFAST, Disp: 90 tablet, Rfl: 3   metoprolol succinate (TOPROL-XL) 50 MG 24 hr tablet, TAKE 1 TABLET BY MOUTH  EVERY DAY IMMEDIATELY FOLLOWING A MEAL (Patient taking differently: Take 50 mg by mouth every evening.), Disp: 90 tablet, Rfl: 1   Multiple Vitamins-Minerals (MULTIVITAMIN WITH MINERALS) tablet, Take 1 tablet by mouth daily., Disp: , Rfl:    omeprazole (PRILOSEC) 40 MG capsule, Take 40 mg by mouth daily., Disp: , Rfl:    ondansetron (ZOFRAN) 8 MG tablet, Take 1 tablet (8 mg total) by mouth every 8 (eight) hours as needed for nausea or vomiting., Disp: 30 tablet, Rfl: 0   Vitamin D, Ergocalciferol, (DRISDOL) 1.25 MG (50000 UNIT) CAPS capsule, TAKE 1 CAPSULE BY MOUTH EVERY 7 DAYS FOR 12 DOSES, Disp: 12 capsule, Rfl: 0  Review of Systems:  Constitutional:  Denies fever, chills, diaphoresis, appetite change. HEENT: Denies photophobia, eye pain, redness, hearing loss, ear pain, congestion, sore throat, rhinorrhea, sneezing, mouth sores, trouble swallowing, neck pain, neck stiffness and tinnitus.   Respiratory: Denies SOB, DOE, cough, chest tightness,  and wheezing.   Cardiovascular: Denies chest pain and leg swelling.  Gastrointestinal: Denies nausea, vomiting, abdominal pain, diarrhea, constipation, blood in stool and abdominal distention.  Genitourinary: Denies dysuria, urgency, frequency, hematuria, flank pain and difficulty urinating.  Endocrine: Denies: hot or cold intolerance, sweats, changes in hair or nails, polyuria, polydipsia. Musculoskeletal: Denies myalgias, back pain, joint swelling, arthralgias and gait problem.  Skin: Denies pallor, rash and wound.  Neurological: Denies dizziness, seizures, syncope, weakness, light-headedness, numbness and headaches.  Hematological: Denies adenopathy. Easy bruising, personal or family bleeding history  Psychiatric/Behavioral: Denies suicidal ideation, mood changes, confusion, nervousness, sleep disturbance and agitation    Physical Exam: Vitals:   10/06/21 0831  BP: 110/80  Pulse: (!) 55  Temp: 98.2 F (36.8 C)  TempSrc: Oral  SpO2: 97%   Weight: 203 lb 12.8 oz (92.4 kg)    Body mass index is 33.91 kg/m.   Constitutional: NAD, calm, comfortable Eyes: PERRL, lids and conjunctivae normal, wears corrective lenses ENMT: Mucous membranes are moist.  Respiratory: clear to auscultation bilaterally, no wheezing, no crackles. Normal respiratory effort. No accessory muscle use.  Cardiovascular: Regular rate and rhythm, no murmurs / rubs / gallops. No extremity edema.  Neurologic: Grossly intact and nonfocal Psychiatric: Normal judgment and insight. Alert and oriented x 3. Normal mood.    Impression and Plan:  Palpitations - Plan: CBC with Differential/Platelet, Comprehensive metabolic panel, TSH, Magnesium, Ambulatory referral to Cardiology  Lightheaded - Plan: EKG 12-Lead  Tachycardia - Plan: EKG 12-Lead  -Suspect she may have some kind of short-lived tachyarrhythmia, like SVT. -EKG done in office today and reviewed by myself as sinus bradycardia at a rate of 52, normal axis, no acute ST or T wave changes. -Rule out hyperthyroidism, electrolyte abnormalities, severe anemia. -Assuming labs are normal, refer to cardiology as I believe she may benefit from a patch/event monitor.  Time spent: 31 minutes reviewing chart, interviewing and examining patient and formulating time care.   Patient Instructions  -Nice seeing you today!!  -Lab work today; will notify you once results are available.  -Referral to cardiology placed today.    Lelon Frohlich, MD [facility] at [facility]

## 2021-10-08 ENCOUNTER — Telehealth: Payer: Self-pay | Admitting: Internal Medicine

## 2021-10-08 NOTE — Telephone Encounter (Signed)
Pt has a question about the magnesium that was just prescribed on Monday--she wants to know what it is for.  She is asking for a return call.

## 2021-10-09 ENCOUNTER — Encounter: Payer: Self-pay | Admitting: Internal Medicine

## 2021-10-09 MED ORDER — ONETOUCH ULTRA VI STRP: ORAL_STRIP | 12 refills | Status: DC

## 2021-10-09 MED ORDER — ONETOUCH ULTRASOFT LANCETS MISC: 12 refills | Status: DC

## 2021-10-09 NOTE — Telephone Encounter (Signed)
Answered via MyChart

## 2021-10-13 ENCOUNTER — Telehealth: Payer: Self-pay | Admitting: Internal Medicine

## 2021-10-13 ENCOUNTER — Encounter: Payer: Self-pay | Admitting: Internal Medicine

## 2021-10-13 MED ORDER — ONETOUCH DELICA PLUS LANCING MISC: 1.0000 | Freq: Every day | 12 refills | Status: AC

## 2021-10-13 MED ORDER — ONETOUCH ULTRASOFT LANCETS MISC: 1.0000 | Freq: Every day | 12 refills | Status: DC

## 2021-10-13 NOTE — Telephone Encounter (Signed)
The lancets called in were the wrong ones.  Patient needs lancets called in for One Touch Delica.  Pharmacy- [facility] on [address]

## 2021-10-13 NOTE — Telephone Encounter (Signed)
Refill sent.

## 2021-10-24 ENCOUNTER — Other Ambulatory Visit: Payer: BC Managed Care – PPO

## 2021-10-27 ENCOUNTER — Other Ambulatory Visit: Payer: Self-pay | Admitting: Internal Medicine

## 2021-10-27 ENCOUNTER — Other Ambulatory Visit (INDEPENDENT_AMBULATORY_CARE_PROVIDER_SITE_OTHER): Payer: Managed Care, Other (non HMO)

## 2021-10-27 LAB — MAGNESIUM: Magnesium: 1.5 mg/dL (ref 1.5–2.5)

## 2021-10-29 ENCOUNTER — Ambulatory Visit: Payer: BC Managed Care – PPO | Admitting: Interventional Cardiology

## 2021-11-07 ENCOUNTER — Ambulatory Visit: Payer: BC Managed Care – PPO | Admitting: Family Medicine

## 2021-12-03 ENCOUNTER — Ambulatory Visit: Payer: BC Managed Care – PPO | Admitting: Internal Medicine

## 2021-12-04 ENCOUNTER — Encounter: Payer: Self-pay | Admitting: Internal Medicine

## 2021-12-04 ENCOUNTER — Other Ambulatory Visit: Payer: Self-pay | Admitting: Internal Medicine

## 2021-12-04 DIAGNOSIS — M5442 Lumbago with sciatica, left side: Secondary | ICD-10-CM

## 2021-12-04 MED ORDER — PREDNISONE 10 MG (21) PO TBPK: ORAL_TABLET | ORAL | 0 refills | Status: DC

## 2021-12-10 ENCOUNTER — Ambulatory Visit: Payer: BC Managed Care – PPO | Admitting: Internal Medicine

## 2021-12-10 ENCOUNTER — Encounter: Payer: Self-pay | Admitting: Internal Medicine

## 2021-12-10 VITALS — BP 106/76 | HR 65 | Temp 98.2°F | Ht 65.0 in | Wt 203.3 lb

## 2021-12-10 DIAGNOSIS — I10 Essential (primary) hypertension: Secondary | ICD-10-CM | POA: Diagnosis not present

## 2021-12-10 DIAGNOSIS — E785 Hyperlipidemia, unspecified: Secondary | ICD-10-CM

## 2021-12-10 DIAGNOSIS — R7302 Impaired glucose tolerance (oral): Secondary | ICD-10-CM

## 2021-12-10 LAB — POCT GLYCOSYLATED HEMOGLOBIN (HGB A1C): Hemoglobin A1C: 5.7 % — AB (ref 4.0–5.6)

## 2021-12-10 NOTE — Progress Notes (Signed)
? ? ? ?Established Patient Office Visit ? ? ? ? ?This visit occurred during the SARS-CoV-2 public health emergency.  Safety protocols were in place, including screening questions prior to the visit, additional usage of staff PPE, and extensive cleaning of exam room while observing appropriate contact time as indicated for disinfecting solutions.  ? ? ?CC/Reason for Visit: Follow-up chronic medical conditions ? ?HPI: Rhonda [name] is a 51 y.o. female who is coming in today for the above mentioned reasons. Past Medical History is significant for: Hypertension, hyperlipidemia, hypothyroidism, impaired glucose tolerance, stage III chronic kidney disease, GERD, vitamin D deficiency.  She is feeling well and has no acute concerns or complaints. ? ? ?Past Medical/Surgical History: ?Past Medical History:  ?Diagnosis Date  ? Anxiety   ? C1q nephropathy   ? Cancer [facility])   ? history of thyroid cancer  ? Chronic constipation   ? CKD (chronic kidney disease), stage II nephrologist-  dr Gretchen Short [facility])  ? secondary to C1q nephropathy/  hypertension  ? Family history of adverse reaction to anesthesia   ? father hard to put down for anesthesia  ? GERD (gastroesophageal reflux disease)   ? H/O radioactive iodine thyroid ablation   ? 1992  right throid  ? History of exercise intolerance   ? [phone]  ETT--  normal w/ no ischemia per cardiologist (dr Aundra Dubin) epic note  ? History of pulmonary embolus (PE)   ? 07/ 2008-- treated w/ coumadin  ? History of syncope   ? recurrent --  workup done per epic -- dx orthostatic hypotension  ? Hyperlipidemia   ? Hypertension   ? IBS (irritable bowel syndrome)   ? Neuromuscular disorder ([place])   ? carpal tunnel  ? Pelvic pain in female   ? Post-surgical hypothyroidism   ? 1991 left thyroidectomy  ? Pre-diabetes   ? Proteinuria   ? Seasonal and perennial allergic rhinitis   ? Wears contact lenses   ? Wears glasses   ? ? ?Past Surgical History:  ?Procedure  Laterality Date  ? 67 HOUR [place] STUDY N/A 11/16/2016  ? Procedure: [place] STUDY;  Surgeon: Doran Stabler, MD;  [facility];  Service: Gastroenterology;  Laterality: N/A;  ? ABDOMINAL HYSTERECTOMY  1998  ? COLONOSCOPY    ? COLONOSCOPY WITH PROPOFOL N/A 08/05/2021  ? Procedure: COLONOSCOPY WITH PROPOFOL;  Surgeon: Sharyn Creamer, MD;  Location: [facility];  Service: Gastroenterology;  Laterality: N/A;  ? CYSTOSCOPY N/A 04/28/2016  ? Procedure: CYSTOSCOPY;  Surgeon: Arvella Nigh, MD;  Location: [facility];  Service: Gynecology;  Laterality: N/A;  ? ESOPHAGEAL MANOMETRY N/A 11/16/2016  ? Procedure: ESOPHAGEAL MANOMETRY (EM);  Surgeon: Doran Stabler, MD;  [facility];  Service: Gastroenterology;  Laterality: N/A;  ? ESOPHAGOGASTRODUODENOSCOPY    ? [place]  ? LAPAROSCOPY N/A 03/26/2016  ? Procedure: LAPAROSCOPY DIAGNOSTIC, LYSIS OF ADHESIONS;  Surgeon: Arvella Nigh, MD;  Location: [place];  Service: Gynecology;  Laterality: N/A;  ? LAPAROTOMY N/A 04/28/2016  ? Procedure: EXPLORATORY LAPAROTOMY;  Surgeon: Arvella Nigh, MD;  Location: [facility];  Service: Gynecology;  Laterality: N/A;  ? RENAL BIOPSY    ? SALPINGOOPHORECTOMY Bilateral 04/28/2016  ? Procedure: Bilateral OOPHORECTOMY;  Surgeon: Arvella Nigh, MD;  Location: [facility];  Service: Gynecology;  Laterality: Bilateral;  ? THYROIDECTOMY Left 1991  ? TRANSTHORACIC ECHOCARDIOGRAM  04/27/2012  ? normal LV, ef 60-65%/  trivial MR  ? TUBAL LIGATION  1992  ? ? ?Social History: ? reports that she has never smoked. She has never used smokeless tobacco. She reports that she does not drink alcohol and does not use drugs. ? ?Allergies: ?Allergies  ?Allergen Reactions  ? Aspirin Hives  ? Flagyl [Metronidazole] Nausea And Vomiting  ?  With oral flagyl.   Has tolerated vaginal flagyl.    ? Food Other (See Comments)  ?  Lima beans--  Positive allergy test   ? Tramadol Other (See Comments)  ?  Headache   ? Dilaudid  [Hydromorphone] Itching  ? ? ?Family History:  ?Family History  ?Problem Relation Age of Onset  ? Diabetes Mother   ?     Pre-diabetic  ? Hypertension Mother   ? Hypertension Father   ? Coronary artery disease Father   ? Diabetes Father   ? Stroke Father   ? Prostate cancer Father   ? Breast cancer Maternal Aunt   ? Thyroid disease Maternal Aunt   ? Colon cancer Neg Hx   ? Colon polyps Neg Hx   ? Kidney disease Neg Hx   ? Esophageal cancer Neg Hx   ? Gallbladder disease Neg Hx   ? Stomach cancer Neg Hx   ? Rectal cancer Neg Hx   ? ? ? ?Current Outpatient Medications:  ?  acyclovir cream (ZOVIRAX) 5 %, Apply 1 application topically every 3 (three) hours. (Patient taking differently: Apply 1 application. topically daily as needed (outbreaks).), Disp: 15 g, Rfl: 2 ?  albuterol (VENTOLIN HFA) 108 (90 Base) MCG/ACT inhaler, Inhale 2 puffs into the lungs every 6 (six) hours as needed., Disp: 6.7 g, Rfl: 1 ?  ALPRAZolam (XANAX) 0.25 MG tablet, Take 1 tablet (0.25 mg total) by mouth 2 (two) times daily as needed for anxiety., Disp: 60 tablet, Rfl: 0 ?  atorvastatin (LIPITOR) 40 MG tablet, TAKE 1 TABLET(40 MG) BY MOUTH DAILY, Disp: 90 tablet, Rfl: 1 ?  benazepril-hydrochlorthiazide (LOTENSIN HCT) 20-25 MG tablet, Take 1 tablet by mouth daily., Disp: 90 tablet, Rfl: 1 ?  cetirizine (ZYRTEC) 10 MG tablet, Take 10 mg by mouth daily., Disp: , Rfl:  ?  docusate sodium (COLACE) 100 MG capsule, Take 100 mg by mouth daily., Disp: , Rfl:  ?  glucose blood (ONETOUCH ULTRA) test strip, Use as instructed, Disp: 100 each, Rfl: 12 ?  Lancet Devices [facility] DELICA PLUS LANCING) MISC, 1 each by Does not apply route daily., Disp: 100 each, Rfl: 12 ?  levothyroxine (SYNTHROID) 100 MCG tablet, TAKE 1 TABLET(100 MCG) BY MOUTH DAILY BEFORE BREAKFAST, Disp: 90 tablet, Rfl: 3 ?  metoprolol succinate (TOPROL-XL) 50 MG 24 hr tablet, TAKE 1 TABLET BY MOUTH EVERY DAY IMMEDIATELY FOLLOWING A MEAL (Patient taking differently: Take 50 mg by mouth every  evening.), Disp: 90 tablet, Rfl: 1 ?  Multiple Vitamins-Minerals (MULTIVITAMIN WITH MINERALS) tablet, Take 1 tablet by mouth daily., Disp: , Rfl:  ?  omeprazole (PRILOSEC) 40 MG capsule, Take 40 mg by mouth daily., Disp: , Rfl:  ?  ondansetron (ZOFRAN) 8 MG tablet, Take 1 tablet (8 mg total) by mouth every 8 (eight) hours as needed for nausea or vomiting., Disp: 30 tablet, Rfl: 0 ?  predniSONE (STERAPRED UNI-PAK 21 TAB) 10 MG (21) TBPK tablet, Take as directed, Disp: 21 tablet, Rfl: 0 ? ?Review of Systems:  ?Constitutional: Denies fever, chills, diaphoresis, appetite change and fatigue.  ?HEENT: Denies photophobia, eye pain, redness, hearing loss, ear pain, congestion, sore throat, rhinorrhea, sneezing, mouth sores, trouble swallowing,  neck pain, neck stiffness and tinnitus.   ?Respiratory: Denies SOB, DOE, cough, chest tightness,  and wheezing.   ?Cardiovascular: Denies chest pain, palpitations and leg swelling.  ?Gastrointestinal: Denies nausea, vomiting, abdominal pain, diarrhea, constipation, blood in stool and abdominal distention.  ?Genitourinary: Denies dysuria, urgency, frequency, hematuria, flank pain and difficulty urinating.  ?Endocrine: Denies: hot or cold intolerance, sweats, changes in hair or nails, polyuria, polydipsia. ?Musculoskeletal: Denies myalgias, back pain, joint swelling, arthralgias and gait problem.  ?Skin: Denies pallor, rash and wound.  ?Neurological: Denies dizziness, seizures, syncope, weakness, light-headedness, numbness and headaches.  ?Hematological: Denies adenopathy. Easy bruising, personal or family bleeding history  ?Psychiatric/Behavioral: Denies suicidal ideation, mood changes, confusion, nervousness, sleep disturbance and agitation ? ? ? ?Physical Exam: ?Vitals:  ? 12/10/21 1004  ?BP: 106/76  ?Pulse: 65  ?Temp: 98.2 ?F (36.8 ?C)  ?TempSrc: Oral  ?SpO2: 97%  ?Weight: 203 lb 4.8 oz (92.2 kg)  ?Height: '5\' 5"'$  (1.651 m)  ? ? ?Body mass index is 33.83 kg/m?. ? ? ?Constitutional:  NAD, calm, comfortable ?Eyes: PERRL, lids and conjunctivae normal ?ENMT: Mucous membranes are moist.  ?Respiratory: clear to auscultation bilaterally, no wheezing, no crackles. Normal respiratory effort. N

## 2021-12-17 ENCOUNTER — Telehealth: Payer: Self-pay | Admitting: Internal Medicine

## 2021-12-17 ENCOUNTER — Other Ambulatory Visit: Payer: Self-pay

## 2021-12-17 DIAGNOSIS — I10 Essential (primary) hypertension: Secondary | ICD-10-CM

## 2021-12-17 DIAGNOSIS — R002 Palpitations: Secondary | ICD-10-CM

## 2021-12-17 DIAGNOSIS — R000 Tachycardia, unspecified: Secondary | ICD-10-CM

## 2021-12-17 MED ORDER — ONETOUCH ULTRA VI STRP: 1.0000 | ORAL_STRIP | Freq: Every day | 12 refills | Status: DC

## 2021-12-17 NOTE — Telephone Encounter (Signed)
Pt is calling and needs a new referral to cardiologist for heart palps . Pt has new insurance bcbs and will fax a copy ?

## 2021-12-17 NOTE — Telephone Encounter (Signed)
Referral placed.

## 2021-12-19 ENCOUNTER — Other Ambulatory Visit: Payer: Self-pay | Admitting: Internal Medicine

## 2021-12-19 MED ORDER — OMEPRAZOLE 40 MG PO CPDR: 40.0000 mg | DELAYED_RELEASE_CAPSULE | Freq: Every day | ORAL | 1 refills | Status: DC

## 2021-12-19 NOTE — Telephone Encounter (Signed)
Pt still takes omeprazole (PRILOSEC) 40 MG capsule/ last insurance didn't cover, new one may ? ? ? ?

## 2021-12-19 NOTE — Addendum Note (Signed)
Addended by: Elza Rafter D on: 12/19/2021 03:44 PM ? ? Modules accepted: Orders ? ?

## 2021-12-19 NOTE — Telephone Encounter (Signed)
LVM instructions for pt to call to confirm if she's still taking this medication as it is not on her medication list. ? ?Last OV: 12/10/21 ?

## 2021-12-22 ENCOUNTER — Telehealth: Payer: Self-pay | Admitting: *Deleted

## 2021-12-22 NOTE — Telephone Encounter (Signed)
Prior auth started for  ?OneTouch ultra blue test strips ?Key: [id number] ? ?Fax will be sent ?

## 2021-12-23 NOTE — Telephone Encounter (Signed)
Waiting on fax from [place] my meds ?

## 2021-12-30 NOTE — Telephone Encounter (Signed)
Form and last office note faxed and confirmed, ?

## 2022-01-01 ENCOUNTER — Telehealth: Payer: Self-pay | Admitting: Internal Medicine

## 2022-01-01 ENCOUNTER — Other Ambulatory Visit: Payer: Self-pay | Admitting: Internal Medicine

## 2022-01-01 NOTE — Telephone Encounter (Signed)
Prior auth started for test strips  Key: [id number] Prior auth started for omeprazole Key: [id number]

## 2022-01-01 NOTE — Telephone Encounter (Signed)
Patient called because the PA for her test strips has been going through [facility] [employer], which is her old insurance and it needs to go through her new [employer] which the card an be found in [place] as she has faxed [place] a copy over.      FYI

## 2022-01-01 NOTE — Telephone Encounter (Signed)
Noted a new prior Rhonda [name] has been started

## 2022-01-05 MED ORDER — ONETOUCH ULTRA VI STRP: ORAL_STRIP | 12 refills | Status: DC

## 2022-01-05 NOTE — Telephone Encounter (Signed)
New Rx sent for test strips

## 2022-01-08 ENCOUNTER — Telehealth: Payer: Self-pay | Admitting: *Deleted

## 2022-01-08 MED ORDER — PANTOPRAZOLE SODIUM 40 MG PO TBEC: 40.0000 mg | DELAYED_RELEASE_TABLET | Freq: Every day | ORAL | 1 refills | Status: DC

## 2022-01-08 NOTE — Telephone Encounter (Signed)
Also they won't pay for my omeprazole because it can be bought over the counter, they don't  sell '40mg'$  over the counter.

## 2022-01-08 NOTE — Telephone Encounter (Signed)
Spoke with patient.  She picked up the test strips yesterday.  Rx pantoprazole 40 mg daily was sent.  Medication list updated.

## 2022-01-08 NOTE — Addendum Note (Signed)
Addended by: Westley Hummer B on: 01/08/2022 11:52 AM   Modules accepted: Orders

## 2022-01-08 NOTE — Telephone Encounter (Signed)
Prior Rhonda [name] has been started for   Protonix Key: [id number]

## 2022-01-09 ENCOUNTER — Ambulatory Visit (INDEPENDENT_AMBULATORY_CARE_PROVIDER_SITE_OTHER): Payer: BC Managed Care – PPO | Admitting: Cardiology

## 2022-01-09 ENCOUNTER — Ambulatory Visit (INDEPENDENT_AMBULATORY_CARE_PROVIDER_SITE_OTHER): Payer: BC Managed Care – PPO

## 2022-01-09 VITALS — BP 110/82 | HR 62 | Ht 65.0 in | Wt 202.9 lb

## 2022-01-09 DIAGNOSIS — R002 Palpitations: Secondary | ICD-10-CM

## 2022-01-09 DIAGNOSIS — I10 Essential (primary) hypertension: Secondary | ICD-10-CM | POA: Diagnosis not present

## 2022-01-09 DIAGNOSIS — I479 Paroxysmal tachycardia, unspecified: Secondary | ICD-10-CM

## 2022-01-09 DIAGNOSIS — Z7189 Other specified counseling: Secondary | ICD-10-CM

## 2022-01-09 DIAGNOSIS — E785 Hyperlipidemia, unspecified: Secondary | ICD-10-CM | POA: Diagnosis not present

## 2022-01-09 NOTE — Progress Notes (Signed)
Cardiology Office Note:    Date:  01/09/2022   ID:  Rhonda [name], DOB Feb 16, 1971, MRN [id number]  PCP:  Isaac Bliss, Rayford Halsted, MD  Cardiologist:  Buford Dresser, MD  Referring MD: Isaac Bliss, Estel*   CC: Hypertension and Tachycardia  History of Present Illness:    Rhonda [name] is a 51 y.o. female with a hx of anxiety, hypertension, hyperlipidemia, hypothyroidism, GERD, PE, thyroid cancer s/p left thyroidectomy (1991), and CKD, who is seen as a new consult at the request of Leotis Shames* for the evaluation and management of palpitations, hypertension and tachycardia.  She saw her PCP Dr. Selmer Dominion on 12/10/2021 where she was doing well with well controlled blood pressures on benazepril/HCTZ and metoprolol.  On 12/17/21 she called her PCP and requested a referral to cardiology for heart palpitations.  Today:  Tachycardia/palpitations: -Initial onset: 1 month ago -Frequency/Duration: 2-3 times a week, often while sitting, lasts 1 minute -Associated symptoms: very brief shortness of breath and dizziness -Aggravating/alleviating factors: none that she can determine -Syncope/near syncope: none -Prior cardiac history: None -Caffeine: None -Alcohol: None -Tobacco: None -OTC supplements: None -Comorbidities: The patient has a blood pressure of 110/82. She confirms that her hypertension is well controlled. -Labs: TSH, kidney function/electrolytes, CBC reviewed. -Cardiac ROS: no chest pain, no shortness of breath, no PND, no orthopnea, no LE edema. -Family history: Father had 2 heart attacks, a stroke, and prostate cancer, which started in his 54's.   Patient has had 3 episodes where when patient is resting at night, she suddenly wakes up struggling to breathe with a racing heart rate. She also feels anxious and "fidgety." Her most recent episode was last week.  She denies any chest pain, or peripheral edema. No lightheadedness,  headaches, syncope, or PND.    Past Medical History:  Diagnosis Date   Anxiety    C1q nephropathy    Cancer ([place])    history of thyroid cancer   Chronic constipation    CKD (chronic kidney disease), stage II nephrologist-  dr Gretchen Short [facility])   secondary to C1q nephropathy/  hypertension   Family history of adverse reaction to anesthesia    father hard to put down for anesthesia   GERD (gastroesophageal reflux disease)    H/O radioactive iodine thyroid ablation    1992  right throid   History of exercise intolerance    01-052011  ETT--  normal w/ no ischemia per cardiologist (dr Aundra Dubin) epic note   History of pulmonary embolus (PE)    07/ 2008-- treated w/ coumadin   History of syncope    recurrent --  workup done per epic -- dx orthostatic hypotension   Hyperlipidemia    Hypertension    IBS (irritable bowel syndrome)    Neuromuscular disorder (H[facility])    carpal tunnel   Pelvic pain in female    Post-surgical hypothyroidism    1991 left thyroidectomy   Pre-diabetes    Proteinuria    Seasonal and perennial allergic rhinitis    Wears contact lenses    Wears glasses     Past Surgical History:  Procedure Laterality Date   93 HOUR [place] STUDY N/A 11/16/2016   Procedure: 24 HOUR PH STUDY;  Surgeon: Doran Stabler, MD;  Location: [facility];  Service: Gastroenterology;  Laterality: N/A;   ABDOMINAL HYSTERECTOMY  1998   COLONOSCOPY     COLONOSCOPY WITH PROPOFOL N/A 08/05/2021   Procedure: COLONOSCOPY WITH PROPOFOL;  Surgeon: Sharyn Creamer,  MD;  Location: [facility];  Service: Gastroenterology;  Laterality: N/A;   CYSTOSCOPY N/A 04/28/2016   Procedure: CYSTOSCOPY;  Surgeon: Arvella Nigh, MD;  Location: [facility];  Service: Gynecology;  Laterality: N/A;   ESOPHAGEAL MANOMETRY N/A 11/16/2016   Procedure: ESOPHAGEAL MANOMETRY (EM);  Surgeon: Doran Stabler, MD;  Location: [facility];  Service: Gastroenterology;  Laterality: N/A;    ESOPHAGOGASTRODUODENOSCOPY     LAPAROSCOPIC CHOLECYSTECTOMY  1997   LAPAROSCOPY N/A 03/26/2016   Procedure: LAPAROSCOPY DIAGNOSTIC, LYSIS OF ADHESIONS;  Surgeon: Arvella Nigh, MD;  Location: [facility];  Service: Gynecology;  Laterality: N/A;   LAPAROTOMY N/A 04/28/2016   Procedure: EXPLORATORY LAPAROTOMY;  Surgeon: Arvella Nigh, MD;  Location: [facility];  Service: Gynecology;  Laterality: N/A;   RENAL BIOPSY     SALPINGOOPHORECTOMY Bilateral 04/28/2016   Procedure: Bilateral OOPHORECTOMY;  Surgeon: Arvella Nigh, MD;  Location: [facility];  Service: Gynecology;  Laterality: Bilateral;   THYROIDECTOMY Left 1991   TRANSTHORACIC ECHOCARDIOGRAM  04/27/2012   normal LV, ef 60-65%/  trivial MR   TUBAL LIGATION  1992    Current Medications: Current Outpatient Medications on File Prior to Visit  Medication Sig   acyclovir cream (ZOVIRAX) 5 % Apply 1 application topically every 3 (three) hours. (Patient not taking: Reported on 01/01/2022)   albuterol (VENTOLIN HFA) 108 (90 Base) MCG/ACT inhaler Inhale 2 puffs into the lungs every 6 (six) hours as needed. (Patient not taking: Reported on 01/01/2022)   ALPRAZolam (XANAX) 0.25 MG tablet Take 1 tablet (0.25 mg total) by mouth 2 (two) times daily as needed for anxiety. (Patient not taking: Reported on 01/01/2022)   atorvastatin (LIPITOR) 40 MG tablet TAKE 1 TABLET(40 MG) BY MOUTH DAILY (Patient not taking: Reported on 01/01/2022)   benazepril-hydrochlorthiazide (LOTENSIN HCT) 20-25 MG tablet TAKE 1 TABLET BY MOUTH DAILY (Patient not taking: Reported on 01/01/2022)   cetirizine (ZYRTEC) 10 MG tablet Take 10 mg by mouth daily. (Patient not taking: Reported on 01/01/2022)   docusate sodium (COLACE) 100 MG capsule Take 100 mg by mouth daily. (Patient not taking: Reported on 01/01/2022)   glucose blood (ONETOUCH ULTRA) test strip Use to check glucose 1 time daily   Lancet Devices (ONETOUCH DELICA PLUS LANCING) MISC 1 each by Does not apply route daily.  (Patient not taking: Reported on 01/01/2022)   levothyroxine (SYNTHROID) 100 MCG tablet TAKE 1 TABLET(100 MCG) BY MOUTH DAILY BEFORE BREAKFAST (Patient not taking: Reported on 01/01/2022)   metoprolol succinate (TOPROL-XL) 50 MG 24 hr tablet TAKE 1 TABLET BY MOUTH EVERY DAY IMMEDIATELY FOLLOWING A MEAL (Patient not taking: Reported on 01/01/2022)   Multiple Vitamins-Minerals (MULTIVITAMIN WITH MINERALS) tablet Take 1 tablet by mouth daily. (Patient not taking: Reported on 01/01/2022)   ondansetron (ZOFRAN) 8 MG tablet Take 1 tablet (8 mg total) by mouth every 8 (eight) hours as needed for nausea or vomiting. (Patient not taking: Reported on 01/01/2022)   pantoprazole (PROTONIX) 40 MG tablet Take 1 tablet (40 mg total) by mouth daily.   predniSONE (STERAPRED UNI-PAK 21 TAB) 10 MG (21) TBPK tablet Take as directed (Patient not taking: Reported on 01/01/2022)   No current facility-administered medications on file prior to visit.     Allergies:   Aspirin, Flagyl [metronidazole], Food, Tramadol, and Dilaudid [hydromorphone]   Social History   Tobacco Use   Smoking status: Never   Smokeless tobacco: Never  Vaping Use   Vaping Use: Never used  Substance Use Topics   Alcohol use: No  Alcohol/week: 0.0 standard drinks   Drug use: No    Family History: family history includes Breast cancer in her maternal aunt; Coronary artery disease in her father; Diabetes in her father and mother; Hypertension in her father and mother; Prostate cancer in her father; Stroke in her father; Thyroid disease in her maternal aunt. There is no history of Colon cancer, Colon polyps, Kidney disease, Esophageal cancer, Gallbladder disease, Stomach cancer, or Rectal cancer.  ROS:    Please see the history of present illness.  Additional pertinent ROS: Constitutional: Negative for chills, fever, night sweats, unintentional weight loss  HENT: Negative for ear pain and hearing loss.   Eyes: Negative for loss of vision and  eye pain.  Respiratory: Negative for cough, sputum, wheezing.  Positive for shortness of breath. Cardiovascular: See HPI Gastrointestinal: Negative for abdominal pain, melena, and hematochezia.  Genitourinary: Negative for dysuria and hematuria.  Musculoskeletal: Negative for falls and myalgias.  Skin: Negative for itching and rash.  Neurological: Negative for focal weakness, focal sensory changes and loss of consciousness. Positive for dizziness. Endo/Heme/Allergies: Does not bruise/bleed easily.     EKGs/Labs/Other Studies Reviewed:    The following studies were reviewed today:  DG Chest 2 View 10/30/19:  FINDINGS: The heart size and mediastinal contours are within normal limits. Both lungs are clear. The visualized skeletal structures are unremarkable. Surgical clips at the thoracic inlet.   IMPRESSION: No active cardiopulmonary disease.  EKG:  EKG is personally reviewed.    01/09/22: NSR at 62 bpm  Recent Labs: 10/06/2021: ALT 14; BUN 31; Creatinine, Ser 1.35; Hemoglobin 12.2; Platelets 232.0; Potassium 4.2; Sodium 139; TSH 1.38 10/27/2021: Magnesium 1.5  Recent Lipid Panel    Component Value Date/Time   CHOL 146 06/13/2021 1059   TRIG 79.0 06/13/2021 1059   HDL 39.40 06/13/2021 1059   CHOLHDL 4 06/13/2021 1059   VLDL 15.8 06/13/2021 1059   LDLCALC 91 06/13/2021 1059   LDLCALC 84 04/23/2020 0833   LDLDIRECT 143.9 06/06/2013 1014    Physical Exam:    VS:  BP 110/82 (BP Location: Right Arm, Patient Position: Sitting, Cuff Size: Large)   Pulse 62   Ht '5\' 5"'$  (1.651 m)   Wt 202 lb 14.4 oz (92 kg)   BMI 33.76 kg/m     Wt Readings from Last 3 Encounters:  01/09/22 202 lb 14.4 oz (92 kg)  12/10/21 203 lb 4.8 oz (92.2 kg)  10/06/21 203 lb 12.8 oz (92.4 kg)    GEN: Well nourished, well developed in no acute distress HEENT: Normal, moist mucous membranes NECK: No JVD CARDIAC: regular rhythm, normal S1 and S2, no rubs or gallops. No murmur. VASCULAR: Radial and DP  pulses 2+ bilaterally. No carotid bruits RESPIRATORY:  Clear to auscultation without rales, wheezing or rhonchi  ABDOMEN: Soft, non-tender, non-distended MUSCULOSKELETAL:  Ambulates independently SKIN: Warm and dry, no edema NEUROLOGIC:  Alert and oriented x 3. No focal neuro deficits noted. PSYCHIATRIC:  Normal affect    ASSESSMENT:    1. Palpitations   2. Essential hypertension   3. Paroxysmal tachycardia ([place])   4. Hyperlipidemia, unspecified hyperlipidemia type   5. Cardiac risk counseling   6. Counseling on health promotion and disease prevention    PLAN:    Palpitations Paroxysmal tachycardia -we discussed the potential causes of fast heart rates and palpitations today. Reviewed the normal electrical system of the heart. Reviewed the role of the sinus node. Reviewed the balance between resting (vagal) tone and fight or flight  nervous system input. Reviewed how exercise improves vagal tone and lowers resting heart rate. Reviewed that sinus tachycardia, or elevated sinus rate, is usually secondary to something else in the body. This can include pain, stress, infection, anxiety, hormone imbalance, low blood counts, etc. Discussed that we do not typically treat sinus tachycardia by itself, and instead the focus is on finding what is driving the heart rate and treating that. We discussed that there can be other rhythm issues, from either the top or bottom of the heart, that are abnormal rhythms. Discussed how we evaluate for these.  -will place Zio monitor today -she is already on metoprolol, discussed that this can help with some types of premature beats  Hypertension -at goal, continue benazepril-HCTZ  Hyperlipidemia -continue atorvastatin  Cardiac risk counseling and prevention recommendations: -recommend heart healthy/Mediterranean diet, with whole grains, fruits, vegetable, fish, lean meats, nuts, and olive oil. Limit salt. -recommend moderate walking, 3-5 times/week for 30-50  minutes each session. Aim for at least 150 minutes.week. Goal should be pace of 3 miles/hours, or walking 1.5 miles in 30 minutes -recommend avoidance of tobacco products. Avoid excess alcohol.  Plan for follow up: TBD based on results of testing.  Buford Dresser, MD, PhD, [facility]    Medication Adjustments/Labs and Tests Ordered: Current medicines are reviewed at length with the patient today.  Concerns regarding medicines are outlined above.  Orders Placed This Encounter  Procedures   LONG TERM MONITOR (3-14 DAYS)   EKG 12-Lead   No orders of the defined types were placed in this encounter.   Patient Instructions  Medication Instructions:  Your Physician recommend you continue on your current medication as directed.    *If you need a refill on your cardiac medications before your next appointment, please call your pharmacy*   Lab Work: None ordered today   Testing/Procedures: Your physician has recommended that you wear a 14 day Zio monitor.   This monitor is a medical device that records the heart's electrical activity. Doctors most often use these monitors to diagnose arrhythmias. Arrhythmias are problems with the speed or rhythm of the heartbeat. The monitor is a small device applied to your chest. You can wear one while you do your normal daily activities. While wearing this monitor if you have any symptoms to push the button and record what you felt. Once you have worn this monitor for the period of time provider prescribed (Usually 14 days), you will return the monitor device in the postage paid box. Once it is returned they will download the data collected and provide [place] with a report which the provider will then review and we will call you with those results. Important tips:  Avoid showering during the first 24 hours of wearing the monitor. Avoid excessive sweating to help maximize wear time. Do not submerge the device, no hot tubs, and no  swimming pools. Keep any lotions or oils away from the patch. After 24 hours you may shower with the patch on. Take brief showers with your back facing the shower head.  Do not remove patch once it has been placed because that will interrupt data and decrease adhesive wear time. Push the button when you have any symptoms and write down what you were feeling. Once you have completed wearing your monitor, remove and place into box which has postage paid and place in your outgoing mailbox.  If for some reason you have misplaced your box then call our office and  we can provide another box and/or mail it off for you.      Follow-Up: At [facility], you and your health needs are our priority.  As part of our continuing mission to provide you with exceptional heart care, we have created designated Provider Care Teams.  These Care Teams include your primary Cardiologist (physician) and Advanced Practice Providers (APPs -  Physician Assistants and Nurse Practitioners) who all work together to provide you with the care you need, when you need it.  We recommend signing up for the patient portal called "MyChart".  Sign up information is provided on this After Visit Summary.  MyChart is used to connect with patients for Virtual Visits (Telemedicine).  Patients are able to view lab/test results, encounter notes, upcoming appointments, etc.  Non-urgent messages can be sent to your provider as well.   To learn more about what you can do with MyChart, go to [email or web address].    Your next appointment:   Based on test results   The format for your next appointment:   In Person  Provider:   Buford Dresser, MD              [facility],acting as a scribe for Buford Dresser, MD.,have documented all relevant documentation on the behalf of Buford Dresser, MD,as directed by  Buford Dresser, MD while in the presence of Buford Dresser, MD.   I, Buford Dresser, MD, have reviewed all documentation for this visit. The documentation on 01/20/22 for the exam, diagnosis, procedures, and orders are all accurate and complete.   Signed, Buford Dresser, MD PhD 01/09/2022     [place]

## 2022-01-09 NOTE — Patient Instructions (Signed)
Medication Instructions:  Your Physician recommend you continue on your current medication as directed.    *If you need a refill on your cardiac medications before your next appointment, please call your pharmacy*   Lab Work: None ordered today   Testing/Procedures: Your physician has recommended that you wear a 14 day Zio monitor.   This monitor is a medical device that records the heart's electrical activity. Doctors most often use these monitors to diagnose arrhythmias. Arrhythmias are problems with the speed or rhythm of the heartbeat. The monitor is a small device applied to your chest. You can wear one while you do your normal daily activities. While wearing this monitor if you have any symptoms to push the button and record what you felt. Once you have worn this monitor for the period of time provider prescribed (Usually 14 days), you will return the monitor device in the postage paid box. Once it is returned they will download the data collected and provide [place] with a report which the provider will then review and we will call you with those results. Important tips:  Avoid showering during the first 24 hours of wearing the monitor. Avoid excessive sweating to help maximize wear time. Do not submerge the device, no hot tubs, and no swimming pools. Keep any lotions or oils away from the patch. After 24 hours you may shower with the patch on. Take brief showers with your back facing the shower head.  Do not remove patch once it has been placed because that will interrupt data and decrease adhesive wear time. Push the button when you have any symptoms and write down what you were feeling. Once you have completed wearing your monitor, remove and place into box which has postage paid and place in your outgoing mailbox.  If for some reason you have misplaced your box then call our office and we can provide another box and/or mail it off for you.      Follow-Up: At [facility], you and  your health needs are our priority.  As part of our continuing mission to provide you with exceptional heart care, we have created designated Provider Care Teams.  These Care Teams include your primary Cardiologist (physician) and Advanced Practice Providers (APPs -  Physician Assistants and Nurse Practitioners) who all work together to provide you with the care you need, when you need it.  We recommend signing up for the patient portal called "MyChart".  Sign up information is provided on this After Visit Summary.  MyChart is used to connect with patients for Virtual Visits (Telemedicine).  Patients are able to view lab/test results, encounter notes, upcoming appointments, etc.  Non-urgent messages can be sent to your provider as well.   To learn more about what you can do with MyChart, go to [email or web address].    Your next appointment:   Based on test results   The format for your next appointment:   In Person  Provider:   Buford Dresser, MD

## 2022-01-15 MED ORDER — ESOMEPRAZOLE MAGNESIUM 20 MG PO CPDR
20.0000 mg | DELAYED_RELEASE_CAPSULE | Freq: Every day | ORAL | 0 refills | Status: DC
Start: 2022-01-15 — End: 2022-01-22

## 2022-01-15 NOTE — Telephone Encounter (Signed)
Rx sent and patient is aware. 

## 2022-01-15 NOTE — Telephone Encounter (Signed)
PA for Protonix was denied. The medication is covered when 2 OTC medications have been tried and did not work, or when the patient cannot take all other OTC generic proton pump inhibitor medications.  Alternatives OTC Lansoprazole and Esomeprazole.  Please advise

## 2022-01-20 ENCOUNTER — Encounter (HOSPITAL_BASED_OUTPATIENT_CLINIC_OR_DEPARTMENT_OTHER): Payer: Self-pay | Admitting: Cardiology

## 2022-01-22 ENCOUNTER — Ambulatory Visit (INDEPENDENT_AMBULATORY_CARE_PROVIDER_SITE_OTHER): Payer: BC Managed Care – PPO | Admitting: Internal Medicine

## 2022-01-22 ENCOUNTER — Encounter: Payer: Self-pay | Admitting: Internal Medicine

## 2022-01-22 VITALS — BP 110/80 | HR 94 | Temp 97.9°F | Wt 203.1 lb

## 2022-01-22 DIAGNOSIS — M545 Low back pain, unspecified: Secondary | ICD-10-CM

## 2022-01-22 LAB — POCT URINALYSIS DIPSTICK
Bilirubin, UA: NEGATIVE
Blood, UA: NEGATIVE
Glucose, UA: NEGATIVE
Ketones, UA: NEGATIVE
Leukocytes, UA: NEGATIVE
Nitrite, UA: NEGATIVE
Protein, UA: NEGATIVE
Spec Grav, UA: 1.015 (ref 1.010–1.025)
Urobilinogen, UA: 0.2 E.U./dL
pH, UA: 6.5 (ref 5.0–8.0)

## 2022-01-22 NOTE — Progress Notes (Signed)
Established Patient Office Visit     CC/Reason for Visit: Low back pain  HPI: Rhonda [name] is a 51 y.o. female who is coming in today for the above mentioned reasons.  She has scheduled this acute visit to discuss the onset of right low back pain that began over the weekend.  Pain is located over her right lower back.  It hurts with bending or twisting motions of her torso.  Sometimes she feels like it radiates forward into her suprapubic area.  No urinary symptoms such as dysuria, urgency or frequency.  No nausea, vomiting, no fever.  Past Medical/Surgical History: Past Medical History:  Diagnosis Date   Anxiety    C1q nephropathy    Cancer ([place])    history of thyroid cancer   Chronic constipation    CKD (chronic kidney disease), stage II nephrologist-  dr Gretchen Short [facility])   secondary to C1q nephropathy/  hypertension   Family history of adverse reaction to anesthesia    father hard to put down for anesthesia   GERD (gastroesophageal reflux disease)    H/O radioactive iodine thyroid ablation    1992  right throid   History of exercise intolerance    01-052011  ETT--  normal w/ no ischemia per cardiologist (dr Aundra Dubin) epic note   History of pulmonary embolus (PE)    07/ 2008-- treated w/ coumadin   History of syncope    recurrent --  workup done per epic -- dx orthostatic hypotension   Hyperlipidemia    Hypertension    IBS (irritable bowel syndrome)    Neuromuscular disorder (HCC)    carpal tunnel   Pelvic pain in female    Post-surgical hypothyroidism    1991 left thyroidectomy   Pre-diabetes    Proteinuria    Seasonal and perennial allergic rhinitis    Wears contact lenses    Wears glasses     Past Surgical History:  Procedure Laterality Date   64 HOUR [place] STUDY N/A 11/16/2016   Procedure: 24 HOUR PH STUDY;  Surgeon: Doran Stabler, MD;  [facility];  Service: Gastroenterology;  Laterality: N/A;    ABDOMINAL HYSTERECTOMY  1998   COLONOSCOPY     COLONOSCOPY WITH PROPOFOL N/A 08/05/2021   Procedure: COLONOSCOPY WITH PROPOFOL;  Surgeon: Sharyn Creamer, MD;  [facility];  Service: Gastroenterology;  Laterality: N/A;   CYSTOSCOPY N/A 04/28/2016   Procedure: CYSTOSCOPY;  Surgeon: Arvella Nigh, MD;  Location: [facility];  Service: Gynecology;  Laterality: N/A;   ESOPHAGEAL MANOMETRY N/A 11/16/2016   Procedure: ESOPHAGEAL MANOMETRY (EM);  Surgeon: Doran Stabler, MD;  [facility];  Service: Gastroenterology;  Laterality: N/A;   ESOPHAGOGASTRODUODENOSCOPY     LAPAROSCOPIC CHOLECYSTECTOMY  1997   LAPAROSCOPY N/A 03/26/2016   Procedure: LAPAROSCOPY DIAGNOSTIC, LYSIS OF ADHESIONS;  Surgeon: Arvella Nigh, MD;  Location: [facility];  Service: Gynecology;  Laterality: N/A;   LAPAROTOMY N/A 04/28/2016   Procedure: EXPLORATORY LAPAROTOMY;  Surgeon: Arvella Nigh, MD;  Location: [facility];  Service: Gynecology;  Laterality: N/A;   RENAL BIOPSY     SALPINGOOPHORECTOMY Bilateral 04/28/2016   Procedure: Bilateral OOPHORECTOMY;  Surgeon: Arvella Nigh, MD;  Location: [facility];  Service: Gynecology;  Laterality: Bilateral;   THYROIDECTOMY Left 1991   TRANSTHORACIC ECHOCARDIOGRAM  04/27/2012   normal LV, ef 60-65%/  trivial MR   TUBAL LIGATION  1992    Social History:  reports that she has never  smoked. She has never used smokeless tobacco. She reports that she does not drink alcohol and does not use drugs.  Allergies: Allergies  Allergen Reactions   Aspirin Hives   Flagyl [Metronidazole] Nausea And Vomiting    With oral flagyl.   Has tolerated vaginal flagyl.     Food Other (See Comments)    Lima beans--  Positive allergy test    Tramadol Other (See Comments)    Headache    Dilaudid [Hydromorphone] Itching    Family History:  Family History  Problem Relation Age of Onset   Diabetes Mother        Pre-diabetic   Hypertension Mother    Hypertension Father     Coronary artery disease Father    Diabetes Father    Stroke Father    Prostate cancer Father    Breast cancer Maternal Aunt    Thyroid disease Maternal Aunt    Colon cancer Neg Hx    Colon polyps Neg Hx    Kidney disease Neg Hx    Esophageal cancer Neg Hx    Gallbladder disease Neg Hx    Stomach cancer Neg Hx    Rectal cancer Neg Hx      Current Outpatient Medications:    acyclovir cream (ZOVIRAX) 5 %, Apply 1 application topically every 3 (three) hours., Disp: 15 g, Rfl: 2   albuterol (VENTOLIN HFA) 108 (90 Base) MCG/ACT inhaler, Inhale 2 puffs into the lungs every 6 (six) hours as needed., Disp: 6.7 g, Rfl: 1   ALPRAZolam (XANAX) 0.25 MG tablet, Take 1 tablet (0.25 mg total) by mouth 2 (two) times daily as needed for anxiety., Disp: 60 tablet, Rfl: 0   atorvastatin (LIPITOR) 40 MG tablet, TAKE 1 TABLET(40 MG) BY MOUTH DAILY, Disp: 90 tablet, Rfl: 1   benazepril-hydrochlorthiazide (LOTENSIN HCT) 20-25 MG tablet, TAKE 1 TABLET BY MOUTH DAILY, Disp: 90 tablet, Rfl: 1   cetirizine (ZYRTEC) 10 MG tablet, Take 10 mg by mouth daily., Disp: , Rfl:    cyclobenzaprine (FLEXERIL) 5 MG tablet, Take 5 mg by mouth as needed., Disp: , Rfl:    docusate sodium (COLACE) 100 MG capsule, Take 100 mg by mouth daily., Disp: , Rfl:    esomeprazole (NEXIUM) 40 MG capsule, Take 40 mg by mouth daily at 12 noon., Disp: , Rfl:    glucose blood (ONETOUCH ULTRA) test strip, Use to check glucose 1 time daily, Disp: 100 each, Rfl: 12   Lancet Devices (ONETOUCH DELICA PLUS LANCING) MISC, 1 each by Does not apply route daily., Disp: 100 each, Rfl: 12   levothyroxine (SYNTHROID) 100 MCG tablet, TAKE 1 TABLET(100 MCG) BY MOUTH DAILY BEFORE BREAKFAST, Disp: 90 tablet, Rfl: 3   metoprolol succinate (TOPROL-XL) 50 MG 24 hr tablet, TAKE 1 TABLET BY MOUTH EVERY DAY IMMEDIATELY FOLLOWING A MEAL, Disp: 90 tablet, Rfl: 1   Multiple Vitamins-Minerals (MULTIVITAMIN WITH MINERALS) tablet, Take 1 tablet by mouth daily., Disp: ,  Rfl:    ondansetron (ZOFRAN) 8 MG tablet, Take 1 tablet (8 mg total) by mouth every 8 (eight) hours as needed for nausea or vomiting., Disp: 30 tablet, Rfl: 0  Review of Systems:  Constitutional: Denies fever, chills, diaphoresis, appetite change and fatigue.  HEENT: Denies photophobia, eye pain, redness, hearing loss, ear pain, congestion, sore throat, rhinorrhea, sneezing, mouth sores, trouble swallowing, neck pain, neck stiffness and tinnitus.   Respiratory: Denies SOB, DOE, cough, chest tightness,  and wheezing.   Cardiovascular: Denies chest pain, palpitations and leg swelling.  Gastrointestinal: Denies nausea, vomiting,, diarrhea, constipation, blood in stool and abdominal distention.  Genitourinary: Denies dysuria, urgency, frequency, hematuria, flank pain and difficulty urinating.  Endocrine: Denies: hot or cold intolerance, sweats, changes in hair or nails, polyuria, polydipsia. Musculoskeletal: Denies  joint swelling, arthralgias and gait problem.  Skin: Denies pallor, rash and wound.  Neurological: Denies dizziness, seizures, syncope, weakness, light-headedness, numbness and headaches.  Hematological: Denies adenopathy. Easy bruising, personal or family bleeding history  Psychiatric/Behavioral: Denies suicidal ideation, mood changes, confusion, nervousness, sleep disturbance and agitation    Physical Exam: Vitals:   01/22/22 0822  BP: 110/80  Pulse: 94  Temp: 97.9 F (36.6 C)  TempSrc: Oral  SpO2: 99%  Weight: 203 lb 1.6 oz (92.1 kg)    Body mass index is 33.8 kg/m.   Constitutional: NAD, calm, comfortable Eyes: PERRL, lids and conjunctivae normal, wears corrective lenses ENMT: Mucous membranes are moist.  Neurologic: Grossly intact and nonfocal Psychiatric: Normal judgment and insight. Alert and oriented x 3. Normal mood.    Impression and Plan:   Acute right-sided low back pain without sciatica   - Plan: POCT urinalysis dipstick -With pain radiating to the  front I was initially concerned about possibility of nephrolithiasis, however in office urine dipstick is clear. -Because pain is aggravated by bending and twisting of the torso this is likely musculoskeletal.  Have advised icing, NSAIDs, local massage therapy and back stretches.  If no improvement can consider physical therapy.    Time spent:24 minutes reviewing chart, interviewing and examining patient and formulating plan of care.     Lelon Frohlich, MD [facility]

## 2022-01-23 DIAGNOSIS — N182 Chronic kidney disease, stage 2 (mild): Secondary | ICD-10-CM | POA: Diagnosis not present

## 2022-01-26 DIAGNOSIS — R002 Palpitations: Secondary | ICD-10-CM | POA: Diagnosis not present

## 2022-01-30 DIAGNOSIS — N2889 Other specified disorders of kidney and ureter: Secondary | ICD-10-CM | POA: Diagnosis not present

## 2022-01-30 DIAGNOSIS — I129 Hypertensive chronic kidney disease with stage 1 through stage 4 chronic kidney disease, or unspecified chronic kidney disease: Secondary | ICD-10-CM | POA: Diagnosis not present

## 2022-01-30 DIAGNOSIS — N182 Chronic kidney disease, stage 2 (mild): Secondary | ICD-10-CM | POA: Diagnosis not present

## 2022-02-06 ENCOUNTER — Encounter: Payer: Self-pay | Admitting: Internal Medicine

## 2022-02-09 MED ORDER — ESOMEPRAZOLE MAGNESIUM 40 MG PO CPDR: 40.0000 mg | DELAYED_RELEASE_CAPSULE | Freq: Every day | ORAL | 1 refills | Status: DC

## 2022-02-13 ENCOUNTER — Other Ambulatory Visit: Payer: Self-pay

## 2022-02-13 ENCOUNTER — Encounter (HOSPITAL_BASED_OUTPATIENT_CLINIC_OR_DEPARTMENT_OTHER): Payer: Self-pay

## 2022-02-13 ENCOUNTER — Emergency Department (HOSPITAL_BASED_OUTPATIENT_CLINIC_OR_DEPARTMENT_OTHER): Payer: BC Managed Care – PPO

## 2022-02-13 ENCOUNTER — Emergency Department (HOSPITAL_BASED_OUTPATIENT_CLINIC_OR_DEPARTMENT_OTHER)
Admission: EM | Admit: 2022-02-13 | Discharge: 2022-02-13 | Disposition: A | Discharge status: Home / Self Care | Payer: BC Managed Care – PPO | Attending: Emergency Medicine | Admitting: Emergency Medicine

## 2022-02-13 DIAGNOSIS — Z79899 Other long term (current) drug therapy: Secondary | ICD-10-CM | POA: Diagnosis not present

## 2022-02-13 DIAGNOSIS — R202 Paresthesia of skin: Secondary | ICD-10-CM

## 2022-02-13 DIAGNOSIS — R519 Headache, unspecified: Secondary | ICD-10-CM | POA: Diagnosis not present

## 2022-02-13 LAB — CBC WITH DIFFERENTIAL/PLATELET
Abs Immature Granulocytes: 0.04 10*3/uL (ref 0.00–0.07)
Basophils Absolute: 0.1 10*3/uL (ref 0.0–0.1)
Basophils Relative: 1 %
Eosinophils Absolute: 0.3 10*3/uL (ref 0.0–0.5)
Eosinophils Relative: 4 %
HCT: 37.8 % (ref 36.0–46.0)
Hemoglobin: 12.1 g/dL (ref 12.0–15.0)
Immature Granulocytes: 1 %
Lymphocytes Relative: 38 %
Lymphs Abs: 2.9 10*3/uL (ref 0.7–4.0)
MCH: 27.6 pg (ref 26.0–34.0)
MCHC: 32 g/dL (ref 30.0–36.0)
MCV: 86.1 fL (ref 80.0–100.0)
Monocytes Absolute: 0.7 10*3/uL (ref 0.1–1.0)
Monocytes Relative: 9 %
Neutro Abs: 3.7 10*3/uL (ref 1.7–7.7)
Neutrophils Relative %: 47 %
Platelets: 249 10*3/uL (ref 150–400)
RBC: 4.39 MIL/uL (ref 3.87–5.11)
RDW: 13.2 % (ref 11.5–15.5)
WBC: 7.7 10*3/uL (ref 4.0–10.5)
nRBC: 0 % (ref 0.0–0.2)

## 2022-02-13 LAB — BASIC METABOLIC PANEL
Anion gap: 8 (ref 5–15)
BUN: 26 mg/dL — ABNORMAL HIGH (ref 6–20)
CO2: 27 mmol/L (ref 22–32)
Calcium: 10.8 mg/dL — ABNORMAL HIGH (ref 8.9–10.3)
Chloride: 104 mmol/L (ref 98–111)
Creatinine, Ser: 1.34 mg/dL — ABNORMAL HIGH (ref 0.44–1.00)
GFR, Estimated: 47 mL/min — ABNORMAL LOW (ref >60)
Glucose, Bld: 114 mg/dL — ABNORMAL HIGH (ref 70–99)
Potassium: 3.7 mmol/L (ref 3.5–5.1)
Sodium: 139 mmol/L (ref 135–145)

## 2022-02-13 LAB — MAGNESIUM: Magnesium: 1.9 mg/dL (ref 1.7–2.4)

## 2022-02-13 NOTE — ED Triage Notes (Signed)
Onset a couple of weeks on tingling to face and head.  Comes and goes.  States worse today.  Both side of head and face.  No vision changes.  NAD

## 2022-02-13 NOTE — ED Provider Notes (Signed)
[facility] EMERGENCY DEPT Provider Note   CSN: [id number] Arrival date & time: 02/13/22  1617     History {Add pertinent medical, surgical, social history, OB history to HPI:1} Chief Complaint  Patient presents with   Tingling    Face and head    Rhonda [name] is a 51 y.o. female.  HPI     Home Medications Prior to Admission medications   Medication Sig Start Date End Date Taking? Authorizing Provider  acyclovir cream (ZOVIRAX) 5 % Apply 1 application topically every 3 (three) hours. 11/13/20   Isaac Bliss, Rayford Halsted, MD  albuterol (VENTOLIN HFA) 108 (90 Base) MCG/ACT inhaler Inhale 2 puffs into the lungs every 6 (six) hours as needed. 12/27/20   Isaac Bliss, Rayford Halsted, MD  ALPRAZolam Duanne Moron) 0.25 MG tablet Take 1 tablet (0.25 mg total) by mouth 2 (two) times daily as needed for anxiety. 12/19/19   Isaac Bliss, Rayford Halsted, MD  atorvastatin (LIPITOR) 40 MG tablet TAKE 1 TABLET(40 MG) BY MOUTH DAILY 08/07/21   Isaac Bliss, Rayford Halsted, MD  benazepril-hydrochlorthiazide (LOTENSIN HCT) 20-25 MG tablet TAKE 1 TABLET BY MOUTH DAILY 01/01/22   Isaac Bliss, Rayford Halsted, MD  cetirizine (ZYRTEC) 10 MG tablet Take 10 mg by mouth daily.    [provider]  cyclobenzaprine (FLEXERIL) 5 MG tablet Take 5 mg by mouth as needed. 08/09/21   [provider]  docusate sodium (COLACE) 100 MG capsule Take 100 mg by mouth daily.    [provider]  esomeprazole (NEXIUM) 40 MG capsule Take 1 capsule (40 mg total) by mouth daily at 12 noon. 02/09/22   Isaac Bliss, Rayford Halsted, MD  glucose blood [facility] ULTRA) test strip Use to check glucose 1 time daily 01/05/22   Isaac Bliss, Rayford Halsted, MD  Lancet Devices [facility] PLUS LANCING) [place] 1 each by Does not apply route daily. 10/13/21   Isaac Bliss, Rayford Halsted, MD  levothyroxine (SYNTHROID) 100 MCG tablet TAKE 1 TABLET(100 MCG) BY MOUTH DAILY BEFORE BREAKFAST 03/17/21   Elayne Snare, MD   metoprolol succinate (TOPROL-XL) 50 MG 24 hr tablet TAKE 1 TABLET BY MOUTH EVERY DAY IMMEDIATELY FOLLOWING A MEAL 04/18/21   Isaac Bliss, Rayford Halsted, MD  Multiple Vitamins-Minerals (MULTIVITAMIN WITH MINERALS) tablet Take 1 tablet by mouth daily.    [provider]  ondansetron (ZOFRAN) 8 MG tablet Take 1 tablet (8 mg total) by mouth every 8 (eight) hours as needed for nausea or vomiting. 05/22/19   Doran Stabler, MD      Allergies    Aspirin, Flagyl [metronidazole], Food, Tramadol, and Dilaudid [hydromorphone]    Review of Systems   Review of Systems  Physical Exam Updated Vital Signs BP 113/86   Pulse 67   Temp 98.5 F (36.9 C)   Resp 18   Ht '5\' 5"'$  (1.651 m)   Wt 92.1 kg   SpO2 98%   BMI 33.78 kg/m  Physical Exam  ED Results / Procedures / Treatments   Labs (all labs ordered are listed, but only abnormal results are displayed) Labs Reviewed  BASIC METABOLIC PANEL - Abnormal; Notable for the following components:      Result Value   Glucose, Bld 114 (*)    BUN 26 (*)    Creatinine, Ser 1.34 (*)    Calcium 10.8 (*)    GFR, Estimated 47 (*)    All other components within normal limits  CBC WITH DIFFERENTIAL/PLATELET  MAGNESIUM    EKG None  Radiology CT Head Wo Contrast  Result Date: 02/13/2022 CLINICAL DATA:  Transient ischemic attack (TIA) Headache, new or worsening (Age >= 50y) EXAM: CT HEAD WITHOUT CONTRAST TECHNIQUE: Contiguous axial images were obtained from the base of the skull through the vertex without intravenous contrast. RADIATION DOSE REDUCTION: This exam was performed according to the departmental dose-optimization program which includes automated exposure control, adjustment of the mA and/or kV according to patient size and/or use of iterative reconstruction technique. COMPARISON:  CT head, 07/11/2016. FINDINGS: Brain: No evidence of acute infarction, hemorrhage, hydrocephalus, extra-axial collection or mass lesion/mass effect. Vascular: No  hyperdense vessel or unexpected calcification. Skull: Normal. Negative for fracture or focal lesion. Sinuses/Orbits: No acute finding. Other: None. IMPRESSION: No acute intracranial abnormality Electronically Signed   By: Michaelle Birks M.D.   On: 02/13/2022 21:23    Procedures Procedures  {Document cardiac monitor, telemetry assessment procedure when appropriate:1}  Medications Ordered in ED Medications - No data to display  ED Course/ Medical Decision Making/ A&P                           Medical Decision Making Amount and/or Complexity of Data Reviewed Labs: ordered. Radiology: ordered.   ***  {Document critical care time when appropriate:1} {Document review of labs and clinical decision tools ie heart score, Chads2Vasc2 etc:1}  {Document your independent review of radiology images, and any outside records:1} {Document your discussion with family members, caretakers, and with consultants:1} {Document social determinants of health affecting pt's care:1} {Document your decision making why or why not admission, treatments were needed:1} Final Clinical Impression(s) / ED Diagnoses Final diagnoses:  None    Rx / DC Orders ED Discharge Orders     None

## 2022-02-13 NOTE — ED Notes (Signed)
Reviewed AVS/discharge instruction with patient. Time allotted for and all questions answered. Patient is agreeable for d/c and escorted to ed exit by staff.  

## 2022-02-13 NOTE — Discharge Instructions (Signed)
Follow-up with your primary care doctor.  Come back to ER if you have any worsening tingling or you develop any numbness, weakness, speech or vision change or other new concerning symptom.

## 2022-02-15 ENCOUNTER — Other Ambulatory Visit: Payer: Self-pay | Admitting: Internal Medicine

## 2022-02-15 DIAGNOSIS — E785 Hyperlipidemia, unspecified: Secondary | ICD-10-CM

## 2022-02-16 ENCOUNTER — Telehealth: Payer: Self-pay | Admitting: Internal Medicine

## 2022-02-16 NOTE — Telephone Encounter (Signed)
Appointment scheduled.

## 2022-02-16 NOTE — Telephone Encounter (Signed)
seeking an ER f/u appointment but Dr Jerilee Hoh is booked up until the end of the month. is there anything we can do?

## 2022-02-16 NOTE — Telephone Encounter (Signed)
Left message on machine returning patient's call 

## 2022-02-23 ENCOUNTER — Encounter: Payer: Self-pay | Admitting: Internal Medicine

## 2022-02-23 ENCOUNTER — Ambulatory Visit (INDEPENDENT_AMBULATORY_CARE_PROVIDER_SITE_OTHER): Payer: BC Managed Care – PPO | Admitting: Internal Medicine

## 2022-02-23 VITALS — BP 120/84 | HR 78 | Temp 98.1°F | Wt 201.7 lb

## 2022-02-23 DIAGNOSIS — R202 Paresthesia of skin: Secondary | ICD-10-CM | POA: Diagnosis not present

## 2022-02-23 DIAGNOSIS — Z09 Encounter for follow-up examination after completed treatment for conditions other than malignant neoplasm: Secondary | ICD-10-CM | POA: Diagnosis not present

## 2022-02-23 MED ORDER — ACYCLOVIR 5 % EX CREA: 1.0000 | TOPICAL_CREAM | CUTANEOUS | 2 refills | Status: DC

## 2022-02-23 NOTE — Progress Notes (Signed)
Established Patient Office Visit     CC/Reason for Visit: ED follow-up  HPI: Rhonda [name] is a 51 y.o. female who is coming in today for the above mentioned reasons.  She was seen in the emergency department on June 30 after an episode of right-sided facial tingling.  She had a comprehensive work-up including a CT scan of the head that was negative.  Labs were essentially unremarkable.  She will still on occasion have bouts of facial tingling although not as intense as that day.  She has not noticed any rash or hearing difficulty.  She has not had any recent URIs.  Past Medical/Surgical History: Past Medical History:  Diagnosis Date   Anxiety    C1q nephropathy    Cancer ([place])    history of thyroid cancer   Chronic constipation    CKD (chronic kidney disease), stage II nephrologist-  dr Gretchen Short [facility])   secondary to C1q nephropathy/  hypertension   Family history of adverse reaction to anesthesia    father hard to put down for anesthesia   GERD (gastroesophageal reflux disease)    H/O radioactive iodine thyroid ablation    1992  right throid   History of exercise intolerance    01-052011  ETT--  normal w/ no ischemia per cardiologist (dr Aundra Dubin) epic note   History of pulmonary embolus (PE)    07/ 2008-- treated w/ coumadin   History of syncope    recurrent --  workup done per epic -- dx orthostatic hypotension   Hyperlipidemia    Hypertension    IBS (irritable bowel syndrome)    Neuromuscular disorder (HCC)    carpal tunnel   Pelvic pain in female    Post-surgical hypothyroidism    1991 left thyroidectomy   Pre-diabetes    Proteinuria    Seasonal and perennial allergic rhinitis    Wears contact lenses    Wears glasses     Past Surgical History:  Procedure Laterality Date   16 HOUR [place] STUDY N/A 11/16/2016   Procedure: 24 HOUR PH STUDY;  Surgeon: Doran Stabler, MD;  [facility];  Service:  Gastroenterology;  Laterality: N/A;   ABDOMINAL HYSTERECTOMY  1998   COLONOSCOPY     COLONOSCOPY WITH PROPOFOL N/A 08/05/2021   Procedure: COLONOSCOPY WITH PROPOFOL;  Surgeon: Sharyn Creamer, MD;  [facility];  Service: Gastroenterology;  Laterality: N/A;   CYSTOSCOPY N/A 04/28/2016   Procedure: CYSTOSCOPY;  Surgeon: Arvella Nigh, MD;  Location: [facility];  Service: Gynecology;  Laterality: N/A;   ESOPHAGEAL MANOMETRY N/A 11/16/2016   Procedure: ESOPHAGEAL MANOMETRY (EM);  Surgeon: Doran Stabler, MD;  [facility];  Service: Gastroenterology;  Laterality: N/A;   ESOPHAGOGASTRODUODENOSCOPY     LAPAROSCOPIC CHOLECYSTECTOMY  1997   LAPAROSCOPY N/A 03/26/2016   Procedure: LAPAROSCOPY DIAGNOSTIC, LYSIS OF ADHESIONS;  Surgeon: Arvella Nigh, MD;  Location: [place];  Service: Gynecology;  Laterality: N/A;   LAPAROTOMY N/A 04/28/2016   Procedure: EXPLORATORY LAPAROTOMY;  Surgeon: Arvella Nigh, MD;  Location: [facility];  Service: Gynecology;  Laterality: N/A;   RENAL BIOPSY     SALPINGOOPHORECTOMY Bilateral 04/28/2016   Procedure: Bilateral OOPHORECTOMY;  Surgeon: Arvella Nigh, MD;  Location: [facility];  Service: Gynecology;  Laterality: Bilateral;   THYROIDECTOMY Left 1991   TRANSTHORACIC ECHOCARDIOGRAM  04/27/2012   normal LV, ef 60-65%/  trivial MR   TUBAL LIGATION  1992    Social History:  reports that she has never smoked. She has never used smokeless tobacco. She reports that she does not drink alcohol and does not use drugs.  Allergies: Allergies  Allergen Reactions   Aspirin Hives   Flagyl [Metronidazole] Nausea And Vomiting    With oral flagyl.   Has tolerated vaginal flagyl.     Food Other (See Comments)    Lima beans--  Positive allergy test    Tramadol Other (See Comments)    Headache    Dilaudid [Hydromorphone] Itching    Family History:  Family History  Problem Relation Age of Onset   Diabetes Mother        Pre-diabetic   Hypertension  Mother    Hypertension Father    Coronary artery disease Father    Diabetes Father    Stroke Father    Prostate cancer Father    Breast cancer Maternal Aunt    Thyroid disease Maternal Aunt    Colon cancer Neg Hx    Colon polyps Neg Hx    Kidney disease Neg Hx    Esophageal cancer Neg Hx    Gallbladder disease Neg Hx    Stomach cancer Neg Hx    Rectal cancer Neg Hx      Current Outpatient Medications:    acyclovir cream (ZOVIRAX) 5 %, Apply 1 Application topically every 3 (three) hours., Disp: 15 g, Rfl: 2   albuterol (VENTOLIN HFA) 108 (90 Base) MCG/ACT inhaler, Inhale 2 puffs into the lungs every 6 (six) hours as needed., Disp: 6.7 g, Rfl: 1   ALPRAZolam (XANAX) 0.25 MG tablet, Take 1 tablet (0.25 mg total) by mouth 2 (two) times daily as needed for anxiety., Disp: 60 tablet, Rfl: 0   atorvastatin (LIPITOR) 40 MG tablet, TAKE 1 TABLET(40 MG) BY MOUTH DAILY, Disp: 90 tablet, Rfl: 1   benazepril-hydrochlorthiazide (LOTENSIN HCT) 20-25 MG tablet, TAKE 1 TABLET BY MOUTH DAILY, Disp: 90 tablet, Rfl: 1   cetirizine (ZYRTEC) 10 MG tablet, Take 10 mg by mouth daily., Disp: , Rfl:    cyclobenzaprine (FLEXERIL) 5 MG tablet, Take 5 mg by mouth as needed., Disp: , Rfl:    docusate sodium (COLACE) 100 MG capsule, Take 100 mg by mouth daily., Disp: , Rfl:    esomeprazole (NEXIUM) 40 MG capsule, Take 1 capsule (40 mg total) by mouth daily at 12 noon., Disp: 90 capsule, Rfl: 1   glucose blood (ONETOUCH ULTRA) test strip, Use to check glucose 1 time daily, Disp: 100 each, Rfl: 12   Lancet Devices (ONETOUCH DELICA PLUS LANCING) MISC, 1 each by Does not apply route daily., Disp: 100 each, Rfl: 12   levothyroxine (SYNTHROID) 100 MCG tablet, TAKE 1 TABLET(100 MCG) BY MOUTH DAILY BEFORE BREAKFAST, Disp: 90 tablet, Rfl: 3   metoprolol succinate (TOPROL-XL) 50 MG 24 hr tablet, TAKE 1 TABLET BY MOUTH EVERY DAY IMMEDIATELY FOLLOWING A MEAL, Disp: 90 tablet, Rfl: 1   Multiple Vitamins-Minerals (MULTIVITAMIN  WITH MINERALS) tablet, Take 1 tablet by mouth daily., Disp: , Rfl:    ondansetron (ZOFRAN) 8 MG tablet, Take 1 tablet (8 mg total) by mouth every 8 (eight) hours as needed for nausea or vomiting., Disp: 30 tablet, Rfl: 0  Review of Systems:  Constitutional: Denies fever, chills, diaphoresis, appetite change and fatigue.  HEENT: Denies photophobia, eye pain, redness, hearing loss, ear pain, congestion, sore throat, rhinorrhea, sneezing, mouth sores, trouble swallowing, neck pain, neck stiffness and tinnitus.   Respiratory: Denies SOB, DOE, cough, chest tightness,  and wheezing.  Cardiovascular: Denies chest pain, palpitations and leg swelling.  Gastrointestinal: Denies nausea, vomiting, abdominal pain, diarrhea, constipation, blood in stool and abdominal distention.  Genitourinary: Denies dysuria, urgency, frequency, hematuria, flank pain and difficulty urinating.  Endocrine: Denies: hot or cold intolerance, sweats, changes in hair or nails, polyuria, polydipsia. Musculoskeletal: Denies myalgias, back pain, joint swelling, arthralgias and gait problem.  Skin: Denies pallor, rash and wound.  Neurological: Denies dizziness, seizures, syncope, weakness, light-headedness, numbness and headaches.  Hematological: Denies adenopathy. Easy bruising, personal or family bleeding history  Psychiatric/Behavioral: Denies suicidal ideation, mood changes, confusion, nervousness, sleep disturbance and agitation    Physical Exam: Vitals:   02/23/22 1601  BP: 120/84  Pulse: 78  Temp: 98.1 F (36.7 C)  TempSrc: Oral  SpO2: 98%  Weight: 201 lb 11.2 oz (91.5 kg)    Body mass index is 33.56 kg/m.   Constitutional: NAD, calm, comfortable Eyes: PERRL, lids and conjunctivae normal, wears corrective lenses ENMT: Mucous membranes are moist.  Tympanic membrane is pearly white, no erythema or bulging. Skin: no rashes, lesions, ulcers. No induration Neurologic: Grossly intact and nonfocal Psychiatric:  Normal judgment and insight. Alert and oriented x 3. Normal mood.    Impression and Plan:  Hospital discharge follow-up  Facial tingling - Plan: [facility] charts have been reviewed in detail, work-up done in the emergency department also reviewed. -Etiology is unclear to me.  Rule out vitamin B12 deficiency today.  I also wonder about the possibility of shingles although she has not yet had development of a rash.   -She will continue to monitor and notify me of any changes.  Time spent:31 minutes reviewing chart, interviewing and examining patient and formulating plan of care.     Lelon Frohlich, MD [facility]

## 2022-02-24 LAB — VITAMIN B12: Vitamin B-12: 300 pg/mL (ref 211–911)

## 2022-02-26 ENCOUNTER — Telehealth: Payer: Self-pay | Admitting: *Deleted

## 2022-02-26 NOTE — Telephone Encounter (Signed)
Prior Rhonda [name] has been started for  Acyclovir 5% cream Waiting on fax

## 2022-02-26 NOTE — Telephone Encounter (Signed)
Form completed, faxed, and confirmed 

## 2022-03-05 NOTE — Telephone Encounter (Signed)
More information faxed

## 2022-03-10 NOTE — Telephone Encounter (Signed)
PA was denied

## 2022-03-17 ENCOUNTER — Other Ambulatory Visit: Payer: Self-pay

## 2022-03-17 MED ORDER — ESOMEPRAZOLE MAGNESIUM 40 MG PO CPDR: 40.0000 mg | DELAYED_RELEASE_CAPSULE | Freq: Every day | ORAL | 1 refills | Status: DC

## 2022-03-17 NOTE — Telephone Encounter (Signed)
Faxed refill request sent from [facility].

## 2022-03-20 ENCOUNTER — Other Ambulatory Visit: Payer: Managed Care, Other (non HMO)

## 2022-03-24 ENCOUNTER — Ambulatory Visit: Payer: Managed Care, Other (non HMO) | Admitting: Endocrinology

## 2022-04-07 ENCOUNTER — Other Ambulatory Visit: Payer: Self-pay

## 2022-04-07 DIAGNOSIS — E890 Postprocedural hypothyroidism: Secondary | ICD-10-CM

## 2022-04-07 MED ORDER — LEVOTHYROXINE SODIUM 100 MCG PO TABS: ORAL_TABLET | ORAL | 3 refills | Status: DC

## 2022-05-04 ENCOUNTER — Other Ambulatory Visit (INDEPENDENT_AMBULATORY_CARE_PROVIDER_SITE_OTHER): Payer: BC Managed Care – PPO

## 2022-05-04 DIAGNOSIS — E890 Postprocedural hypothyroidism: Secondary | ICD-10-CM | POA: Diagnosis not present

## 2022-05-04 LAB — TSH: TSH: 0.24 u[IU]/mL — ABNORMAL LOW (ref 0.35–5.50)

## 2022-05-04 LAB — T4, FREE: Free T4: 1.13 ng/dL (ref 0.60–1.60)

## 2022-05-04 NOTE — Addendum Note (Signed)
Addended by: Cinda Quest on: 05/04/2022 10:26 AM   Modules accepted: Orders

## 2022-05-06 ENCOUNTER — Encounter: Payer: Self-pay | Admitting: Internal Medicine

## 2022-05-06 ENCOUNTER — Telehealth (INDEPENDENT_AMBULATORY_CARE_PROVIDER_SITE_OTHER): Payer: BC Managed Care – PPO | Admitting: Internal Medicine

## 2022-05-06 ENCOUNTER — Telehealth: Payer: Self-pay | Admitting: Family

## 2022-05-06 ENCOUNTER — Ambulatory Visit: Payer: BC Managed Care – PPO | Admitting: Endocrinology

## 2022-05-06 VITALS — Ht 65.0 in | Wt 202.0 lb

## 2022-05-06 DIAGNOSIS — U071 COVID-19: Secondary | ICD-10-CM | POA: Diagnosis not present

## 2022-05-06 MED ORDER — MOLNUPIRAVIR EUA 200MG CAPSULE: 4.0000 | ORAL_CAPSULE | Freq: Two times a day (BID) | ORAL | 0 refills | Status: AC

## 2022-05-06 NOTE — Progress Notes (Signed)
Virtual Visit via Video Note  I connected with Rhonda [name] on 05/06/22 at 10:00 AM EDT by a video enabled telemedicine application and verified that I am speaking with the correct person using two identifiers.  Location patient: home Location provider: work office Persons participating in the virtual visit: patient, provider  I discussed the limitations of evaluation and management by telemedicine and the availability of in person appointments. The patient expressed understanding and agreed to proceed.   HPI: Scheduled this visit to inform me that she tested positive for COVID yesterday.  She has been feeling sick for 2 days with postnasal drip, sore throat.  Yesterday she progressed to having body aches and chills and a temp of 100.4 that prompted her testing.  She works as a International aid/development worker, she also works as a Quarry manager, over the weekend she had a [place] work exposure.   ROS: Constitutional: Positive for fever, chills, appetite change and fatigue.  HEENT: Denies photophobia, eye pain, redness, mouth sores, trouble swallowing, neck pain, neck stiffness and tinnitus.   Respiratory: Denies SOB, DOE,chest tightness,  and wheezing.   Cardiovascular: Denies chest pain, palpitations and leg swelling.  Gastrointestinal: Denies nausea, vomiting, abdominal pain, diarrhea, constipation, blood in stool and abdominal distention.  Genitourinary: Denies dysuria, urgency, frequency, hematuria, flank pain and difficulty urinating.  Endocrine: Denies: hot or cold intolerance, sweats, changes in hair or nails, polyuria, polydipsia. Musculoskeletal: Denies myalgias, back pain, joint swelling, arthralgias and gait problem.  Skin: Denies pallor, rash and wound.  Neurological: Denies dizziness, seizures, syncope, weakness, light-headedness, numbness and headaches.  Hematological: Denies adenopathy. Easy bruising, personal or family bleeding history  Psychiatric/Behavioral: Denies suicidal  ideation, mood changes, confusion, nervousness, sleep disturbance and agitation   Past Medical History:  Diagnosis Date   Anxiety    C1q nephropathy    Cancer ([place])    history of thyroid cancer   Chronic constipation    CKD (chronic kidney disease), stage II nephrologist-  dr Gretchen Short [facility])   secondary to C1q nephropathy/  hypertension   Family history of adverse reaction to anesthesia    father hard to put down for anesthesia   GERD (gastroesophageal reflux disease)    H/O radioactive iodine thyroid ablation    1992  right throid   History of exercise intolerance    01-052011  ETT--  normal w/ no ischemia per cardiologist (dr Aundra Dubin) epic note   History of pulmonary embolus (PE)    07/ 2008-- treated w/ coumadin   History of syncope    recurrent --  workup done per epic -- dx orthostatic hypotension   Hyperlipidemia    Hypertension    IBS (irritable bowel syndrome)    Neuromuscular disorder (HCC)    carpal tunnel   Pelvic pain in female    Post-surgical hypothyroidism    1991 left thyroidectomy   Pre-diabetes    Proteinuria    Seasonal and perennial allergic rhinitis    Wears contact lenses    Wears glasses     Past Surgical History:  Procedure Laterality Date   69 HOUR [place] STUDY N/A 11/16/2016   Procedure: 24 HOUR PH STUDY;  Surgeon: Doran Stabler, MD;  [facility]: [facility];  Service: Gastroenterology;  Laterality: N/A;   ABDOMINAL HYSTERECTOMY  1998   COLONOSCOPY     COLONOSCOPY WITH PROPOFOL N/A 08/05/2021   Procedure: COLONOSCOPY WITH PROPOFOL;  Surgeon: Sharyn Creamer, MD;  [facility];  Service: Gastroenterology;  Laterality:  N/A;   CYSTOSCOPY N/A 04/28/2016   Procedure: CYSTOSCOPY;  Surgeon: Arvella Nigh, MD;  Location: [facility];  Service: Gynecology;  Laterality: N/A;   ESOPHAGEAL MANOMETRY N/A 11/16/2016   Procedure: ESOPHAGEAL MANOMETRY (EM);  Surgeon: Doran Stabler, MD;  [facility];  Service:  Gastroenterology;  Laterality: N/A;   ESOPHAGOGASTRODUODENOSCOPY     LAPAROSCOPIC CHOLECYSTECTOMY  1997   LAPAROSCOPY N/A 03/26/2016   Procedure: LAPAROSCOPY DIAGNOSTIC, LYSIS OF ADHESIONS;  Surgeon: Arvella Nigh, MD;  Location: [facility];  Service: Gynecology;  Laterality: N/A;   LAPAROTOMY N/A 04/28/2016   Procedure: EXPLORATORY LAPAROTOMY;  Surgeon: Arvella Nigh, MD;  Location: [facility];  Service: Gynecology;  Laterality: N/A;   RENAL BIOPSY     SALPINGOOPHORECTOMY Bilateral 04/28/2016   Procedure: Bilateral OOPHORECTOMY;  Surgeon: Arvella Nigh, MD;  Location: [facility];  Service: Gynecology;  Laterality: Bilateral;   THYROIDECTOMY Left 1991   TRANSTHORACIC ECHOCARDIOGRAM  04/27/2012   normal LV, ef 60-65%/  trivial MR   TUBAL LIGATION  1992    Family History  Problem Relation Age of Onset   Diabetes Mother        Pre-diabetic   Hypertension Mother    Hypertension Father    Coronary artery disease Father    Diabetes Father    Stroke Father    Prostate cancer Father    Breast cancer Maternal Aunt    Thyroid disease Maternal Aunt    Colon cancer Neg Hx    Colon polyps Neg Hx    Kidney disease Neg Hx    Esophageal cancer Neg Hx    Gallbladder disease Neg Hx    Stomach cancer Neg Hx    Rectal cancer Neg Hx     SOCIAL HX:   reports that she has never smoked. She has never used smokeless tobacco. She reports that she does not drink alcohol and does not use drugs.   Current Outpatient Medications:    albuterol (VENTOLIN HFA) 108 (90 Base) MCG/ACT inhaler, Inhale 2 puffs into the lungs every 6 (six) hours as needed., Disp: 6.7 g, Rfl: 1   ALPRAZolam (XANAX) 0.25 MG tablet, Take 1 tablet (0.25 mg total) by mouth 2 (two) times daily as needed for anxiety., Disp: 60 tablet, Rfl: 0   atorvastatin (LIPITOR) 40 MG tablet, TAKE 1 TABLET(40 MG) BY MOUTH DAILY, Disp: 90 tablet, Rfl: 1   benazepril-hydrochlorthiazide (LOTENSIN HCT) 20-25 MG tablet, TAKE 1 TABLET BY MOUTH DAILY,  Disp: 90 tablet, Rfl: 1   cetirizine (ZYRTEC) 10 MG tablet, Take 10 mg by mouth daily., Disp: , Rfl:    docusate sodium (COLACE) 100 MG capsule, Take 100 mg by mouth daily., Disp: , Rfl:    esomeprazole (NEXIUM) 40 MG capsule, Take 1 capsule (40 mg total) by mouth daily at 12 noon., Disp: 90 capsule, Rfl: 1   glucose blood (ONETOUCH ULTRA) test strip, Use to check glucose 1 time daily, Disp: 100 each, Rfl: 12   Lancet Devices (ONETOUCH DELICA PLUS LANCING) MISC, 1 each by Does not apply route daily., Disp: 100 each, Rfl: 12   levothyroxine (SYNTHROID) 100 MCG tablet, TAKE 1 TABLET(100 MCG) BY MOUTH DAILY BEFORE BREAKFAST, Disp: 90 tablet, Rfl: 3   molnupiravir EUA (LAGEVRIO) 200 mg CAPS capsule, Take 4 capsules (800 mg total) by mouth 2 (two) times daily for 5 days., Disp: 40 capsule, Rfl: 0   Multiple Vitamins-Minerals (MULTIVITAMIN WITH MINERALS) tablet, Take 1 tablet by mouth daily., Disp: , Rfl:  ondansetron (ZOFRAN) 8 MG tablet, Take 1 tablet (8 mg total) by mouth every 8 (eight) hours as needed for nausea or vomiting., Disp: 30 tablet, Rfl: 0   acyclovir cream (ZOVIRAX) 5 %, Apply 1 Application topically every 3 (three) hours., Disp: 15 g, Rfl: 2   cyclobenzaprine (FLEXERIL) 5 MG tablet, Take 5 mg by mouth as needed., Disp: , Rfl:    metoprolol succinate (TOPROL-XL) 50 MG 24 hr tablet, TAKE 1 TABLET BY MOUTH EVERY DAY IMMEDIATELY FOLLOWING A MEAL, Disp: 90 tablet, Rfl: 1  EXAM:   VITALS per patient if applicable: Tmax of 160.1  GENERAL: alert, oriented, sounds congested  HEENT: atraumatic, conjunttiva clear, no obvious abnormalities on inspection of external nose and ears  NECK: normal movements of the head and neck  LUNGS: on inspection no signs of respiratory distress, breathing rate appears normal, no obvious gross increased work of breathing, gasping or wheezing  CV: no obvious cyanosis  MS: moves all visible extremities without noticeable abnormality  PSYCH/NEURO: pleasant  and cooperative, no obvious depression or anxiety, speech and thought processing grossly intact  ASSESSMENT AND PLAN:   COVID-19 - Plan: molnupiravir EUA (LAGEVRIO) 200 mg CAPS capsule -She may also use OTC medications such as antihistamines, decongestants, pain relievers, guaifenesin. -We have reviewed quarantine period of 5 days. -We have discussed symptoms that would promote ED evaluation. -She knows to follow with [place] if symptoms fail to resolve.     I discussed the assessment and treatment plan with the patient. The patient was provided an opportunity to ask questions and all were answered. The patient agreed with the plan and demonstrated an understanding of the instructions.   The patient was advised to call back or seek an in-person evaluation if the symptoms worsen or if the condition fails to improve as anticipated.    Lelon Frohlich, MD  [facility] at [facility]

## 2022-05-06 NOTE — Telephone Encounter (Signed)
Pt updated with monitor results and verbalized understanding.

## 2022-05-06 NOTE — Telephone Encounter (Signed)
Patient returning call for monitor results. 

## 2022-05-15 ENCOUNTER — Ambulatory Visit (INDEPENDENT_AMBULATORY_CARE_PROVIDER_SITE_OTHER): Payer: BC Managed Care – PPO | Admitting: *Deleted

## 2022-05-15 ENCOUNTER — Telehealth: Payer: Self-pay | Admitting: *Deleted

## 2022-05-15 DIAGNOSIS — Z23 Encounter for immunization: Secondary | ICD-10-CM

## 2022-05-15 NOTE — Telephone Encounter (Signed)
Patient presented as a nurse's visit for the flu vaccine and stated she tested positive for Covid 10 days ago.  Dr Sarajane Jews was informed of this and approved the vaccine to be given today versus 14 day recommendation for vaccines post-Covid.

## 2022-05-20 ENCOUNTER — Telehealth: Payer: Self-pay | Admitting: Internal Medicine

## 2022-05-20 NOTE — Telephone Encounter (Signed)
Patient complaining of head congestion and a cough at night.  Patient would like to know what to try OTC?  Okay to respond through [place]

## 2022-05-20 NOTE — Telephone Encounter (Signed)
Patient is aware 

## 2022-05-20 NOTE — Telephone Encounter (Signed)
Pt called requesting to speak with CMA. Pt was asked if call was regarding a medication refill, as I could help with that.  Pt stated she just needs advice, and would rather speak directly to CMA.  Pt asked that CMA call back at their earliest convenience.

## 2022-06-10 ENCOUNTER — Other Ambulatory Visit: Payer: Self-pay | Admitting: Internal Medicine

## 2022-06-10 DIAGNOSIS — Z1231 Encounter for screening mammogram for malignant neoplasm of breast: Secondary | ICD-10-CM

## 2022-06-15 ENCOUNTER — Encounter: Payer: Self-pay | Admitting: Internal Medicine

## 2022-06-15 ENCOUNTER — Encounter: Payer: BC Managed Care – PPO | Admitting: Internal Medicine

## 2022-06-15 ENCOUNTER — Ambulatory Visit (INDEPENDENT_AMBULATORY_CARE_PROVIDER_SITE_OTHER): Payer: BC Managed Care – PPO | Admitting: Internal Medicine

## 2022-06-15 VITALS — BP 117/70 | HR 83 | Temp 98.5°F | Ht 65.0 in | Wt 196.6 lb

## 2022-06-15 DIAGNOSIS — I10 Essential (primary) hypertension: Secondary | ICD-10-CM | POA: Diagnosis not present

## 2022-06-15 DIAGNOSIS — N183 Chronic kidney disease, stage 3 unspecified: Secondary | ICD-10-CM

## 2022-06-15 DIAGNOSIS — J301 Allergic rhinitis due to pollen: Secondary | ICD-10-CM | POA: Diagnosis not present

## 2022-06-15 DIAGNOSIS — E559 Vitamin D deficiency, unspecified: Secondary | ICD-10-CM

## 2022-06-15 DIAGNOSIS — E890 Postprocedural hypothyroidism: Secondary | ICD-10-CM

## 2022-06-15 DIAGNOSIS — Z0000 Encounter for general adult medical examination without abnormal findings: Secondary | ICD-10-CM | POA: Diagnosis not present

## 2022-06-15 DIAGNOSIS — E785 Hyperlipidemia, unspecified: Secondary | ICD-10-CM

## 2022-06-15 LAB — COMPREHENSIVE METABOLIC PANEL
ALT: 16 U/L (ref 0–35)
AST: 18 U/L (ref 0–37)
Albumin: 4.3 g/dL (ref 3.5–5.2)
Alkaline Phosphatase: 95 U/L (ref 39–117)
BUN: 27 mg/dL — ABNORMAL HIGH (ref 6–23)
CO2: 28 mEq/L (ref 19–32)
Calcium: 11 mg/dL — ABNORMAL HIGH (ref 8.4–10.5)
Chloride: 104 mEq/L (ref 96–112)
Creatinine, Ser: 1.2 mg/dL (ref 0.40–1.20)
GFR: 51.73 mL/min — ABNORMAL LOW (ref >60.00)
Glucose, Bld: 91 mg/dL (ref 70–99)
Potassium: 4.1 mEq/L (ref 3.5–5.1)
Sodium: 139 mEq/L (ref 135–145)
Total Bilirubin: 0.4 mg/dL (ref 0.2–1.2)
Total Protein: 8.1 g/dL (ref 6.0–8.3)

## 2022-06-15 LAB — LIPID PANEL
Cholesterol: 150 mg/dL (ref 0–200)
HDL: 47.5 mg/dL (ref >39.00)
LDL Cholesterol: 89 mg/dL (ref 0–99)
NonHDL: 102.38
Total CHOL/HDL Ratio: 3
Triglycerides: 65 mg/dL (ref 0.0–149.0)
VLDL: 13 mg/dL (ref 0.0–40.0)

## 2022-06-15 LAB — CBC WITH DIFFERENTIAL/PLATELET
Basophils Absolute: 0 10*3/uL (ref 0.0–0.1)
Basophils Relative: 0.6 % (ref 0.0–3.0)
Eosinophils Absolute: 0.3 10*3/uL (ref 0.0–0.7)
Eosinophils Relative: 3.8 % (ref 0.0–5.0)
HCT: 36.9 % (ref 36.0–46.0)
Hemoglobin: 12.1 g/dL (ref 12.0–15.0)
Lymphocytes Relative: 37.7 % (ref 12.0–46.0)
Lymphs Abs: 2.5 10*3/uL (ref 0.7–4.0)
MCHC: 32.9 g/dL (ref 30.0–36.0)
MCV: 84.7 fl (ref 78.0–100.0)
Monocytes Absolute: 0.6 10*3/uL (ref 0.1–1.0)
Monocytes Relative: 9.3 % (ref 3.0–12.0)
Neutro Abs: 3.2 10*3/uL (ref 1.4–7.7)
Neutrophils Relative %: 48.6 % (ref 43.0–77.0)
Platelets: 270 10*3/uL (ref 150.0–400.0)
RBC: 4.35 Mil/uL (ref 3.87–5.11)
RDW: 14.6 % (ref 11.5–15.5)
WBC: 6.6 10*3/uL (ref 4.0–10.5)

## 2022-06-15 LAB — HEMOGLOBIN A1C: Hgb A1c MFr Bld: 6.2 % (ref 4.6–6.5)

## 2022-06-15 LAB — TSH: TSH: 0.38 u[IU]/mL (ref 0.35–5.50)

## 2022-06-15 LAB — VITAMIN D 25 HYDROXY (VIT D DEFICIENCY, FRACTURES): VITD: 22.22 ng/mL — ABNORMAL LOW (ref 30.00–100.00)

## 2022-06-15 MED ORDER — ALBUTEROL SULFATE HFA 108 (90 BASE) MCG/ACT IN AERS: 2.0000 | INHALATION_SPRAY | Freq: Four times a day (QID) | RESPIRATORY_TRACT | 1 refills | Status: AC | PRN

## 2022-06-15 NOTE — Progress Notes (Signed)
Established Patient Office Visit     CC/Reason for Visit: Annual preventive exam  HPI: Rhonda [name] is a 51 y.o. female who is coming in today for the above mentioned reasons. Past Medical History is significant for: Hypertension, hyperlipidemia, impaired glucose tolerance, hypothyroidism, chronic kidney disease stage III and previous vitamin D deficiency.  She is doing well and has no acute concerns or complaints.  She has routine eye and dental care.   Past Medical/Surgical History: Past Medical History:  Diagnosis Date   Anxiety    C1q nephropathy    Cancer ([place])    history of thyroid cancer   Chronic constipation    CKD (chronic kidney disease), stage II nephrologist-  dr Gretchen Short [facility])   secondary to C1q nephropathy/  hypertension   Family history of adverse reaction to anesthesia    father hard to put down for anesthesia   GERD (gastroesophageal reflux disease)    H/O radioactive iodine thyroid ablation    1992  right throid   History of exercise intolerance    01-052011  ETT--  normal w/ no ischemia per cardiologist (dr Aundra Dubin) epic note   History of pulmonary embolus (PE)    07/ 2008-- treated w/ coumadin   History of syncope    recurrent --  workup done per epic -- dx orthostatic hypotension   Hyperlipidemia    Hypertension    IBS (irritable bowel syndrome)    Neuromuscular disorder (HCC)    carpal tunnel   Pelvic pain in female    Post-surgical hypothyroidism    1991 left thyroidectomy   Pre-diabetes    Proteinuria    Seasonal and perennial allergic rhinitis    Wears contact lenses    Wears glasses     Past Surgical History:  Procedure Laterality Date   36 HOUR [place] STUDY N/A 11/16/2016   Procedure: 24 HOUR PH STUDY;  Surgeon: Doran Stabler, MD;  [facility];  Service: Gastroenterology;  Laterality: N/A;   ABDOMINAL HYSTERECTOMY  1998   COLONOSCOPY     COLONOSCOPY WITH PROPOFOL N/A 08/05/2021    Procedure: COLONOSCOPY WITH PROPOFOL;  Surgeon: Sharyn Creamer, MD;  [facility];  Service: Gastroenterology;  Laterality: N/A;   CYSTOSCOPY N/A 04/28/2016   Procedure: CYSTOSCOPY;  Surgeon: Arvella Nigh, MD;  Location: [facility];  Service: Gynecology;  Laterality: N/A;   ESOPHAGEAL MANOMETRY N/A 11/16/2016   Procedure: ESOPHAGEAL MANOMETRY (EM);  Surgeon: Doran Stabler, MD;  [facility];  Service: Gastroenterology;  Laterality: N/A;   ESOPHAGOGASTRODUODENOSCOPY     LAPAROSCOPIC CHOLECYSTECTOMY  1997   LAPAROSCOPY N/A 03/26/2016   Procedure: LAPAROSCOPY DIAGNOSTIC, LYSIS OF ADHESIONS;  Surgeon: Arvella Nigh, MD;  Location: [facility];  Service: Gynecology;  Laterality: N/A;   LAPAROTOMY N/A 04/28/2016   Procedure: EXPLORATORY LAPAROTOMY;  Surgeon: Arvella Nigh, MD;  Location: [facility];  Service: Gynecology;  Laterality: N/A;   RENAL BIOPSY     SALPINGOOPHORECTOMY Bilateral 04/28/2016   Procedure: Bilateral OOPHORECTOMY;  Surgeon: Arvella Nigh, MD;  Location: [facility];  Service: Gynecology;  Laterality: Bilateral;   THYROIDECTOMY Left 1991   TRANSTHORACIC ECHOCARDIOGRAM  04/27/2012   normal LV, ef 60-65%/  trivial MR   TUBAL LIGATION  1992    Social History:  reports that she has never smoked. She has never used smokeless tobacco. She reports that she does not drink alcohol and does not use drugs.  Allergies: Allergies  Allergen Reactions  Aspirin Hives   Flagyl [Metronidazole] Nausea And Vomiting    With oral flagyl.   Has tolerated vaginal flagyl.     Food Other (See Comments)    [place] beans--  Positive allergy test    Tramadol Other (See Comments)    Headache    Dilaudid [Hydromorphone] Itching    Family History:  Family History  Problem Relation Age of Onset   Diabetes Mother        Pre-diabetic   Hypertension Mother    Hypertension Father    Coronary artery disease Father    Diabetes Father    Stroke Father    Prostate cancer Father     Breast cancer Maternal Aunt    Thyroid disease Maternal Aunt    Colon cancer Neg Hx    Colon polyps Neg Hx    Kidney disease Neg Hx    Esophageal cancer Neg Hx    Gallbladder disease Neg Hx    Stomach cancer Neg Hx    Rectal cancer Neg Hx      Current Outpatient Medications:    ALPRAZolam (XANAX) 0.25 MG tablet, Take 1 tablet (0.25 mg total) by mouth 2 (two) times daily as needed for anxiety., Disp: 60 tablet, Rfl: 0   atorvastatin (LIPITOR) 40 MG tablet, TAKE 1 TABLET(40 MG) BY MOUTH DAILY, Disp: 90 tablet, Rfl: 1   benazepril-hydrochlorthiazide (LOTENSIN HCT) 20-25 MG tablet, TAKE 1 TABLET BY MOUTH DAILY, Disp: 90 tablet, Rfl: 1   cetirizine (ZYRTEC) 10 MG tablet, Take 10 mg by mouth daily., Disp: , Rfl:    docusate sodium (COLACE) 100 MG capsule, Take 100 mg by mouth daily., Disp: , Rfl:    esomeprazole (NEXIUM) 40 MG capsule, Take 1 capsule (40 mg total) by mouth daily at 12 noon., Disp: 90 capsule, Rfl: 1   glucose blood (ONETOUCH ULTRA) test strip, Use to check glucose 1 time daily, Disp: 100 each, Rfl: 12   Lancet Devices (ONETOUCH DELICA PLUS LANCING) MISC, 1 each by Does not apply route daily., Disp: 100 each, Rfl: 12   levothyroxine (SYNTHROID) 100 MCG tablet, TAKE 1 TABLET(100 MCG) BY MOUTH DAILY BEFORE BREAKFAST, Disp: 90 tablet, Rfl: 3   Multiple Vitamins-Minerals (MULTIVITAMIN WITH MINERALS) tablet, Take 1 tablet by mouth daily., Disp: , Rfl:    ondansetron (ZOFRAN) 8 MG tablet, Take 1 tablet (8 mg total) by mouth every 8 (eight) hours as needed for nausea or vomiting., Disp: 30 tablet, Rfl: 0   albuterol (VENTOLIN HFA) 108 (90 Base) MCG/ACT inhaler, Inhale 2 puffs into the lungs every 6 (six) hours as needed., Disp: 6.7 g, Rfl: 1  Review of Systems:  Constitutional: Denies fever, chills, diaphoresis, appetite change and fatigue.  HEENT: Denies photophobia, eye pain, redness, hearing loss, ear pain, congestion, sore throat, rhinorrhea, sneezing, mouth sores, trouble  swallowing, neck pain, neck stiffness and tinnitus.   Respiratory: Denies SOB, DOE, cough, chest tightness,  and wheezing.   Cardiovascular: Denies chest pain, palpitations and leg swelling.  Gastrointestinal: Denies nausea, vomiting, abdominal pain, diarrhea, constipation, blood in stool and abdominal distention.  Genitourinary: Denies dysuria, urgency, frequency, hematuria, flank pain and difficulty urinating.  Endocrine: Denies: hot or cold intolerance, sweats, changes in hair or nails, polyuria, polydipsia. Musculoskeletal: Denies myalgias, back pain, joint swelling, arthralgias and gait problem.  Skin: Denies pallor, rash and wound.  Neurological: Denies dizziness, seizures, syncope, weakness, light-headedness, numbness and headaches.  Hematological: Denies adenopathy. Easy bruising, personal or family bleeding history  Psychiatric/Behavioral: Denies suicidal ideation, mood  changes, confusion, nervousness, sleep disturbance and agitation    Physical Exam: Vitals:   06/15/22 0957 06/15/22 0959  BP: (!) 140/90 117/70  Pulse: 83   Temp: 98.5 F (36.9 C)   TempSrc: Oral   SpO2: 98%   Weight: 196 lb 9.6 oz (89.2 kg)   Height: '5\' 5"'$  (1.651 m)     Body mass index is 32.72 kg/m.   Constitutional: NAD, calm, comfortable Eyes: PERRL, lids and conjunctivae normal ENMT: Mucous membranes are moist. Posterior pharynx clear of any exudate or lesions. Normal dentition. Tympanic membrane is pearly white, no erythema or bulging. Neck: normal, supple, no masses, no thyromegaly Respiratory: clear to auscultation bilaterally, no wheezing, no crackles. Normal respiratory effort. No accessory muscle use.  Cardiovascular: Regular rate and rhythm, no murmurs / rubs / gallops. No extremity edema. 2+ pedal pulses. No carotid bruits.  Abdomen: no tenderness, no masses palpated. No hepatosplenomegaly. Bowel sounds positive.  Musculoskeletal: no clubbing / cyanosis. No joint deformity upper and lower  extremities. Good ROM, no contractures. Normal muscle tone.  Skin: no rashes, lesions, ulcers. No induration Neurologic: CN 2-12 grossly intact. Sensation intact, DTR normal. Strength 5/5 in all 4.  Psychiatric: Normal judgment and insight. Alert and oriented x 3. Normal mood.   [place] Office Visit from 06/15/2022 in [place] at [place]  PHQ-9 Total Score 0         Impression and Plan:  Encounter for preventive health examination - Plan: CBC with Differential/Platelet, Comprehensive metabolic panel, Hemoglobin A1c, Lipid panel, Lipid panel, Hemoglobin A1c, Comprehensive metabolic panel, CBC with Differential/Platelet  Non-seasonal allergic rhinitis due to pollen - Plan: albuterol (VENTOLIN HFA) 108 (90 Base) MCG/ACT inhaler  Essential hypertension  Dyslipidemia  Stage 3 chronic kidney disease, unspecified whether stage 3a or 3b CKD (HCC)  Postoperative hypothyroidism - Plan: TSH  Vitamin D deficiency - Plan: VITAMIN D 25 Hydroxy (Vit-D Deficiency, Fractures)   -Recommend routine eye and dental care. -Immunizations: Due for second shingles and COVID-vaccine but she elects to defer today -Healthy lifestyle discussed in detail. -Labs to be updated today. -Colon cancer screening: 07/2021 -Breast cancer screening: Scheduled for December -Cervical cancer screening: 07/2021 -Lung cancer screening: Not applicable -Prostate cancer screening: Not applicable -DEXA: Not applicable    Josefita Weissmann Isaac Bliss, MD [facility] at [facility]

## 2022-06-16 ENCOUNTER — Other Ambulatory Visit: Payer: Self-pay | Admitting: *Deleted

## 2022-06-16 DIAGNOSIS — E559 Vitamin D deficiency, unspecified: Secondary | ICD-10-CM

## 2022-06-24 ENCOUNTER — Other Ambulatory Visit: Payer: Self-pay | Admitting: Internal Medicine

## 2022-06-30 ENCOUNTER — Encounter: Payer: Self-pay | Admitting: Internal Medicine

## 2022-06-30 MED ORDER — VITAMIN D (ERGOCALCIFEROL) 1.25 MG (50000 UNIT) PO CAPS: 50000.0000 [IU] | ORAL_CAPSULE | ORAL | 0 refills | Status: DC

## 2022-07-30 ENCOUNTER — Encounter: Payer: Self-pay | Admitting: Endocrinology

## 2022-07-30 ENCOUNTER — Ambulatory Visit (INDEPENDENT_AMBULATORY_CARE_PROVIDER_SITE_OTHER): Payer: BC Managed Care – PPO | Admitting: Endocrinology

## 2022-07-30 VITALS — BP 94/62 | HR 91 | Ht 65.0 in | Wt 200.4 lb

## 2022-07-30 DIAGNOSIS — E890 Postprocedural hypothyroidism: Secondary | ICD-10-CM | POA: Diagnosis not present

## 2022-07-30 NOTE — Progress Notes (Signed)
Patient ID: Rhonda [name], female   DOB: 1970-09-24, 51 y.o.   MRN: [id number]             Reason for Appointment:  Hypothyroidism, follow-up visit    History of Present Illness:   Hypothyroidism was first diagnosed in 1991  Hypothyroidism apparently was secondary to surgery on a goiter and no details are available She was told that after the surgery there was a focus of cancer found in her thyroid but again no details available She subsequently had radioactive iodine treatment         The patient has been treated with variable doses of levothyroxine  At her initial consultation she had been on a stable dose of 150 mcg levothyroxine and was feeling fairly good with this    However subsequently since 05/2018 despite no new symptoms at that time her TSH was 0.01 No prior levels of TSH are available on her record since 2016  RECENT HISTORY:  Her levothyroxine dose had been adjusted periodically  She is still taking 100 mcg daily as a stable dose She has complaints of fatigue only related to her work  Has not been seen in follow-up for over a year  The patient takes the levothyroxine at least 1 an hour before breakfast and is taking it regularly with water  She also takes her other medications and Prilosec in the morning Not on any vitamins         Her TSH is recently low normal but checked twice within 2 weeks  Patient's weight history is as follows:  Wt Readings from Last 3 Encounters:  07/30/22 200 lb 6.4 oz (90.9 kg)  06/15/22 196 lb 9.6 oz (89.2 kg)  05/06/22 202 lb (91.6 kg)   Thyroid function results have been as follows:  Lab Results  Component Value Date   TSH 0.38 06/15/2022   TSH 0.24 (L) 05/04/2022   TSH 1.38 10/06/2021   TSH 0.74 06/13/2021   FREET4 1.13 05/04/2022   FREET4 0.93 03/18/2021   FREET4 1.10 02/16/2020   FREET4 0.97 09/08/2019   T3FREE 3.1 09/08/2019   T3FREE 3.7 09/27/2018   T3FREE 3.2 09/02/2018   HYPERCALCEMIA:  She had a  high calcium in October 2019 and 10/20 However review of her medications indicates that she was previously not on HCTZ and has been on 25 mg HCTZ since middle of 2019  PTH level done from [place] lab was high but was normal from [place] in 2/20  Subsequently calcium levels have not been high and on the last 2 measurements back to normal  Lab Results  Component Value Date   CALCIUM 11.0 (H) 06/15/2022   CALCIUM 10.8 (H) 02/13/2022   CALCIUM 10.6 (H) 10/06/2021   CALCIUM 10.0 06/13/2021   CALCIUM 9.8 08/30/2019   CALCIUM 9.7 07/07/2019   CALCIUM 10.7 (H) 05/19/2019   Lab Results  Component Value Date   PTH 32 09/27/2018   CALCIUM 11.0 (H) 06/15/2022   CAION 5.40 08/05/2018     Past Medical History:  Diagnosis Date   Anxiety    C1q nephropathy    Cancer ([place])    history of thyroid cancer   Chronic constipation    CKD (chronic kidney disease), stage II nephrologist-  dr Gretchen Short [facility])   secondary to C1q nephropathy/  hypertension   Family history of adverse reaction to anesthesia    father hard to put down for anesthesia   GERD (gastroesophageal reflux disease)  H/O radioactive iodine thyroid ablation    1992  right throid   History of exercise intolerance    [id number]  ETT--  normal w/ no ischemia per cardiologist (dr Aundra Dubin) epic note   History of pulmonary embolus (PE)    07/ 2008-- treated w/ coumadin   History of syncope    recurrent --  workup done per epic -- dx orthostatic hypotension   Hyperlipidemia    Hypertension    IBS (irritable bowel syndrome)    Neuromuscular disorder (H[facility])    carpal tunnel   Pelvic pain in female    Post-surgical hypothyroidism    1991 left thyroidectomy   Pre-diabetes    Proteinuria    Seasonal and perennial allergic rhinitis    Wears contact lenses    Wears glasses     Past Surgical History:  Procedure Laterality Date   41 HOUR [place] STUDY N/A 11/16/2016   Procedure: [place] STUDY;  Surgeon:  Doran Stabler, MD;  Location: [facility];  Service: Gastroenterology;  Laterality: N/A;   ABDOMINAL HYSTERECTOMY  1998   COLONOSCOPY     COLONOSCOPY WITH PROPOFOL N/A 08/05/2021   Procedure: COLONOSCOPY WITH PROPOFOL;  Surgeon: Sharyn Creamer, MD;  Location: [facility];  Service: Gastroenterology;  Laterality: N/A;   CYSTOSCOPY N/A 04/28/2016   Procedure: CYSTOSCOPY;  Surgeon: Arvella Nigh, MD;  Location: [facility];  Service: Gynecology;  Laterality: N/A;   ESOPHAGEAL MANOMETRY N/A 11/16/2016   Procedure: ESOPHAGEAL MANOMETRY (EM);  Surgeon: Doran Stabler, MD;  Location: [facility];  Service: Gastroenterology;  Laterality: N/A;   ESOPHAGOGASTRODUODENOSCOPY     LAPAROSCOPIC CHOLECYSTECTOMY  1997   LAPAROSCOPY N/A 03/26/2016   Procedure: LAPAROSCOPY DIAGNOSTIC, LYSIS OF ADHESIONS;  Surgeon: Arvella Nigh, MD;  Location: [facility];  Service: Gynecology;  Laterality: N/A;   LAPAROTOMY N/A 04/28/2016   Procedure: EXPLORATORY LAPAROTOMY;  Surgeon: Arvella Nigh, MD;  Location: [facility];  Service: Gynecology;  Laterality: N/A;   RENAL BIOPSY     SALPINGOOPHORECTOMY Bilateral 04/28/2016   Procedure: Bilateral OOPHORECTOMY;  Surgeon: Arvella Nigh, MD;  Location: [facility];  Service: Gynecology;  Laterality: Bilateral;   THYROIDECTOMY Left 1991   TRANSTHORACIC ECHOCARDIOGRAM  04/27/2012   normal LV, ef 60-65%/  trivial MR   TUBAL LIGATION  1992    Family History  Problem Relation Age of Onset   Diabetes Mother        Pre-diabetic   Hypertension Mother    Hypertension Father    Coronary artery disease Father    Diabetes Father    Stroke Father    Prostate cancer Father    Breast cancer Maternal Aunt    Thyroid disease Maternal Aunt    Colon cancer Neg Hx    Colon polyps Neg Hx    Kidney disease Neg Hx    Esophageal cancer Neg Hx    Gallbladder disease Neg Hx    Stomach cancer Neg Hx    Rectal cancer Neg Hx     Social History:  reports that she has never smoked. She  has never used smokeless tobacco. She reports that she does not drink alcohol and does not use drugs.  Allergies:   Allergies  Allergen Reactions   Aspirin Hives   Flagyl [Metronidazole] Nausea And Vomiting    With oral flagyl.   Has tolerated vaginal flagyl.     Food Other (See Comments)    Lima beans--  Positive allergy test    Tramadol  Other (See Comments)    Headache    Dilaudid [Hydromorphone] Itching    Allergies as of 07/30/2022       Reactions   Aspirin Hives   Flagyl [metronidazole] Nausea And Vomiting   With oral flagyl.   Has tolerated vaginal flagyl.     Food Other (See Comments)   Lima beans--  Positive allergy test    Tramadol Other (See Comments)   Headache    Dilaudid [hydromorphone] Itching        Medication List        Accurate as of July 30, 2022  2:12 PM. If you have any questions, ask your nurse or doctor.          albuterol 108 (90 Base) MCG/ACT inhaler Commonly known as: VENTOLIN HFA Inhale 2 puffs into the lungs every 6 (six) hours as needed.   ALPRAZolam 0.25 MG tablet Commonly known as: XANAX Take 1 tablet (0.25 mg total) by mouth 2 (two) times daily as needed for anxiety.   atorvastatin 40 MG tablet Commonly known as: LIPITOR TAKE 1 TABLET(40 MG) BY MOUTH DAILY   benazepril-hydrochlorthiazide 20-25 MG tablet Commonly known as: LOTENSIN HCT TAKE 1 TABLET BY MOUTH DAILY   cetirizine 10 MG tablet Commonly known as: ZYRTEC Take 10 mg by mouth daily.   docusate sodium 100 MG capsule Commonly known as: COLACE Take 100 mg by mouth daily.   esomeprazole 40 MG capsule Commonly known as: NEXIUM Take 1 capsule (40 mg total) by mouth daily at 12 noon.   levothyroxine 100 MCG tablet Commonly known as: SYNTHROID TAKE 1 TABLET(100 MCG) BY MOUTH DAILY BEFORE BREAKFAST   multivitamin with minerals tablet Take 1 tablet by mouth daily.   ondansetron 8 MG tablet Commonly known as: Zofran Take 1 tablet (8 mg total) by mouth every  8 (eight) hours as needed for nausea or vomiting.   OneTouch Delica Plus Lancing Misc 1 each by Does not apply route daily.   OneTouch Ultra test strip Generic drug: glucose blood Use to check glucose 1 time daily   Vitamin D (Ergocalciferol) 1.25 MG (50000 UNIT) Caps capsule Commonly known as: DRISDOL Take 1 capsule (50,000 Units total) by mouth every 7 (seven) days.           Review of Systems  Hypertension, followed by her PCP but has not had a visit for a few months  BP Readings from Last 3 Encounters:  07/30/22 94/62  06/15/22 117/70  02/23/22 120/84   Has been screened for diabetes with A1c is last 5.9           Examination:    BP 94/62   Pulse 91   Ht '5\' 5"'$  (1.651 m)   Wt 200 lb 6.4 oz (90.9 kg)   SpO2 99%   BMI 33.35 kg/m   Thyroid not palpable Biceps reflexes appear to be  Assessment:  HYPOTHYROIDISM, postsurgical  She is taking 100 mcg of levothyroxine for some time  She is regular with her supplement and her TSH is within normal No unusual fatigue  PLAN:  Since her TSH has been relatively low a couple of months ago will recheck today before making a change In her levothyroxine 100 mcg daily  Reminded her to take this without any vitamins or iron before eating in the morning consistently  Follow-up in 12 months if no changes needed   Elayne Snare 07/30/2022, 2:12 PM     Note: This office note was prepared with Dragon voice  recognition system technology. Any transcriptional errors that result from this process are unintentional.  1: TSH is now 1.15, she will continue same dose  Elayne Snare

## 2022-07-31 ENCOUNTER — Other Ambulatory Visit (INDEPENDENT_AMBULATORY_CARE_PROVIDER_SITE_OTHER): Payer: BC Managed Care – PPO

## 2022-07-31 DIAGNOSIS — E890 Postprocedural hypothyroidism: Secondary | ICD-10-CM

## 2022-07-31 LAB — T4, FREE: Free T4: 0.98 ng/dL (ref 0.60–1.60)

## 2022-07-31 LAB — TSH: TSH: 1.15 u[IU]/mL (ref 0.35–5.50)

## 2022-08-06 ENCOUNTER — Ambulatory Visit
Admission: RE | Admit: 2022-08-06 | Discharge: 2022-08-06 | Disposition: A | Discharge status: Home / Self Care | Payer: BC Managed Care – PPO | Source: Ambulatory Visit | Attending: Internal Medicine | Admitting: Internal Medicine

## 2022-08-06 DIAGNOSIS — Z1231 Encounter for screening mammogram for malignant neoplasm of breast: Secondary | ICD-10-CM | POA: Diagnosis not present

## 2022-08-14 ENCOUNTER — Other Ambulatory Visit: Payer: Self-pay | Admitting: Internal Medicine

## 2022-08-14 DIAGNOSIS — E785 Hyperlipidemia, unspecified: Secondary | ICD-10-CM

## 2022-08-18 ENCOUNTER — Ambulatory Visit (INDEPENDENT_AMBULATORY_CARE_PROVIDER_SITE_OTHER): Payer: BC Managed Care – PPO | Admitting: Family Medicine

## 2022-08-18 ENCOUNTER — Encounter: Payer: Self-pay | Admitting: Family Medicine

## 2022-08-18 VITALS — BP 102/70 | HR 62 | Temp 98.1°F | Wt 197.0 lb

## 2022-08-18 DIAGNOSIS — J019 Acute sinusitis, unspecified: Secondary | ICD-10-CM | POA: Diagnosis not present

## 2022-08-18 LAB — POCT INFLUENZA A/B
Influenza A, POC: NEGATIVE
Influenza B, POC: NEGATIVE

## 2022-08-18 LAB — POC COVID19 BINAXNOW: SARS Coronavirus 2 Ag: NEGATIVE

## 2022-08-18 MED ORDER — AZITHROMYCIN 250 MG PO TABS: ORAL_TABLET | ORAL | 0 refills | Status: DC

## 2022-08-18 NOTE — Addendum Note (Signed)
Addended by: Wyvonne Lenz on: 08/18/2022 05:17 PM   Modules accepted: Orders

## 2022-08-18 NOTE — Progress Notes (Signed)
   Subjective:    Patient ID: Rhonda [name], female    DOB: [birth year], 52 y.o.   MRN: [id number]  HPI Here for 2 weeks of sinus congestion, PND, and pressure in the ears. No fever or ST or cough.    Review of Systems  Constitutional: Negative.   HENT:  Positive for congestion, ear pain, postnasal drip and sinus pressure. Negative for sore throat.   Eyes: Negative.   Respiratory: Negative.         Objective:   Physical Exam Constitutional:      Appearance: Normal appearance. She is not ill-appearing.  HENT:     Right Ear: Tympanic membrane, ear canal and external ear normal.     Left Ear: Tympanic membrane, ear canal and external ear normal.     Nose: Nose normal.     Mouth/Throat:     Pharynx: Oropharynx is clear.  Eyes:     Conjunctiva/sclera: Conjunctivae normal.  Pulmonary:     Effort: Pulmonary effort is normal.     Breath sounds: Normal breath sounds.  Lymphadenopathy:     Cervical: No cervical adenopathy.  Neurological:     Mental Status: She is alert.           Assessment & Plan:  Sinusitis, treat with a Zpack. Add Mucinex as needed.  Alysia Penna, MD

## 2022-09-21 ENCOUNTER — Ambulatory Visit (INDEPENDENT_AMBULATORY_CARE_PROVIDER_SITE_OTHER): Payer: BC Managed Care – PPO | Admitting: Family Medicine

## 2022-09-21 ENCOUNTER — Ambulatory Visit: Payer: Self-pay

## 2022-09-21 ENCOUNTER — Other Ambulatory Visit: Payer: Self-pay | Admitting: Internal Medicine

## 2022-09-21 VITALS — BP 116/84 | HR 80 | Ht 65.0 in | Wt 196.0 lb

## 2022-09-21 DIAGNOSIS — G5603 Carpal tunnel syndrome, bilateral upper limbs: Secondary | ICD-10-CM

## 2022-09-21 DIAGNOSIS — M79644 Pain in right finger(s): Secondary | ICD-10-CM | POA: Diagnosis not present

## 2022-09-21 DIAGNOSIS — M79642 Pain in left hand: Secondary | ICD-10-CM

## 2022-09-21 DIAGNOSIS — G5601 Carpal tunnel syndrome, right upper limb: Secondary | ICD-10-CM | POA: Diagnosis not present

## 2022-09-21 DIAGNOSIS — M25531 Pain in right wrist: Secondary | ICD-10-CM

## 2022-09-21 DIAGNOSIS — G8929 Other chronic pain: Secondary | ICD-10-CM | POA: Diagnosis not present

## 2022-09-21 DIAGNOSIS — M79641 Pain in right hand: Secondary | ICD-10-CM | POA: Diagnosis not present

## 2022-09-21 NOTE — Progress Notes (Signed)
I, Peterson Lombard, LAT, ATC acting as a scribe for Lynne Leader, MD.  Rhonda [name] is a 52 y.o. female who presents to [place] [facility] today for cont'd bilat wrist pain, R>L. Pt was last seen by Dr. Georgina Snell on 09/26/21 and was given a R dorsal wrist steroid injection. Pt was last seen for bilat CTS on 01/07/21. Today, pt reports R dorsal wrist pain has returned/worsened over the last month. Pain is now coming up into her R hand and R thumb.   Grip strength: decreased- esp w/ heavy objects Paresthesia- yes- L whole hand/fingers  Dx testing: 01/30/21 Bilat NCV study (not completed)             01/30/21 R hand XR  Pertinent review of systems: No fevers or chills  Relevant historical information: History of a DVT.  History of CKD.   Exam:  BP 116/84   Pulse 80   Ht '5\' 5"'$  (1.651 m)   Wt 196 lb (88.9 kg)   SpO2 97%   BMI 32.62 kg/m  General: Well Developed, well nourished, and in no acute distress.   MSK: Right wrist mild effusion otherwise normal. Decreased wrist motion.  Tender palpation dorsal wrist. Right hand and thumb: Swelling at his first [place].  Tender palpation first [place].  Decreased motion.  Nontender MCP.  No triggering present.  Intact grip strength.  Left wrist normal. Normal wrist motion and strength. Positive Tinel's at carpal tunnel. Positive Phalen's test.   C-spine: Normal appearing. Normal cervical motion. Negative Spurling's test. Upper extremity strength is intact.    Lab and Radiology Results  Procedure: Real-time Ultrasound Guided Injection of right dorsal wrist radiocarpal joint Device: Philips Affiniti 50G Images permanently stored and available for review in PACS Verbal informed consent obtained.  Discussed risks and benefits of procedure. Warned about infection, bleeding, hyperglycemia damage to structures among others. Patient expresses understanding and agreement Time-out conducted.   Noted no overlying  erythema, induration, or other signs of local infection.   Skin prepped in a sterile fashion.   Local anesthesia: Topical Ethyl chloride.   With sterile technique and under real time ultrasound guidance: 40 mg of Kenalog and 1 mL of Marcaine injected into dorsal wrist radiocarpal joint. Fluid seen entering the wrist joint.   Completed without difficulty   Pain immediately resolved suggesting accurate placement of the medication.   Advised to call if fevers/chills, erythema, induration, drainage, or persistent bleeding.   Images permanently stored and available for review in the ultrasound unit.  Impression: Technically successful ultrasound guided injection.  Procedure: Real-time Ultrasound Guided Injection of right thumb first [facility] Device: Philips Affiniti 50G Images permanently stored and available for review in PACS Verbal informed consent obtained.  Discussed risks and benefits of procedure. Warned about infection, bleeding, hyperglycemia damage to structures among others. Patient expresses understanding and agreement Time-out conducted.   Noted no overlying erythema, induration, or other signs of local infection.   Skin prepped in a sterile fashion.   Local anesthesia: Topical Ethyl chloride.   With sterile technique and under real time ultrasound guidance: 20 mg of Kenalog and 0.5 mL of Marcaine injected into first [place]. Fluid seen entering the joint capsule.   Completed without difficulty   Pain immediately resolved suggesting accurate placement of the medication.   Advised to call if fevers/chills, erythema, induration, drainage, or persistent bleeding.   Images permanently stored and available for review in the ultrasound unit.  Impression: Technically successful ultrasound guided  injection.      Diagnostic Limited MSK Ultrasound of: Left carpal tunnel Median nerve enlarged measuring slightly over 15 mm cross-sectional area and associated with the ulnar artery. Impression:  Carpal tunnel syndrome left wrist.    Assessment and Plan: 52 y.o. 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  Plan on steroid injection into the right dorsal wrist radiocarpal joint and right first [facility]MC injection.  Additionally she has evidence of left carpal tunnel syndrome to explain her paresthesias.  Plan for carpal tunnel wrist brace at night.  Refer to hand therapy as that should help her symptoms significantly.  Recheck in 1 month.   PDMP not reviewed this encounter. Orders Placed This Encounter  Procedures   [place] LIMITED JOINT SPACE STRUCTURES UP BILAT(NO LINKED CHARGES)    Order Specific Question:   Reason for Exam (SYMPTOM  OR DIAGNOSIS REQUIRED)    Answer:   bilateral wrist pain    Order Specific Question:   Preferred imaging location?    Answer:   [place]   Ambulatory referral to Occupational Therapy    Referral Priority:   Routine    Referral Type:   Occupational Therapy    Referral Reason:   Specialty Services Required    Requested Specialty:   Occupational Therapy    Number of Visits Requested:   1   No orders of the defined types were placed in this encounter.    Discussed warning signs or symptoms. Please see discharge instructions. Patient expresses understanding.   The above documentation has been reviewed and is accurate and complete Lynne Leader, M.D.

## 2022-09-21 NOTE — Patient Instructions (Signed)
Thank you for coming in today.   You received an injection today. Seek immediate medical attention if the joint becomes red, extremely painful, or is oozing fluid.   I've referred you to Occupational Therapy for hand therapy.  Let us know if you don't hear from them in one week.   Wear a carpal tunnel wrist brace.  Check back in 6 weeks

## 2022-09-22 ENCOUNTER — Ambulatory Visit: Payer: BC Managed Care – PPO

## 2022-10-06 DIAGNOSIS — Z6832 Body mass index (BMI) 32.0-32.9, adult: Secondary | ICD-10-CM | POA: Diagnosis not present

## 2022-10-06 DIAGNOSIS — Z01419 Encounter for gynecological examination (general) (routine) without abnormal findings: Secondary | ICD-10-CM | POA: Diagnosis not present

## 2022-10-06 DIAGNOSIS — Z1272 Encounter for screening for malignant neoplasm of vagina: Secondary | ICD-10-CM | POA: Diagnosis not present

## 2022-10-06 DIAGNOSIS — Z1151 Encounter for screening for human papillomavirus (HPV): Secondary | ICD-10-CM | POA: Diagnosis not present

## 2022-10-09 NOTE — Therapy (Signed)
OUTPATIENT OCCUPATIONAL THERAPY ORTHO EVALUATION  Patient Name: Rhonda [name] MRN: [id number] DOB:1970/09/20, 52 y.o., female Today's Date: 10/15/2022  PCP: Isaac Bliss, Olam Idler, MD REFERRING PROVIDER: Gregor Hams, MD   END OF SESSION:  OT End of Session - 10/15/22 1142     Visit Number 1    Number of Visits 10    Date for OT Re-Evaluation 11/27/22    Authorization Type BCBS    OT Start Time [id number]    OT Stop Time 1235    OT Time Calculation (min) 50 min    Activity Tolerance No increased pain;Patient limited by pain;Patient limited by fatigue;Patient tolerated treatment well    Behavior During Therapy [facility] for tasks assessed/performed             Past Medical History:  Diagnosis Date   Anxiety    C1q nephropathy    Cancer ([place])    history of thyroid cancer   Chronic constipation    CKD (chronic kidney disease), stage II nephrologist-  dr Gretchen Short [facility])   secondary to C1q nephropathy/  hypertension   Family history of adverse reaction to anesthesia    father hard to put down for anesthesia   GERD (gastroesophageal reflux disease)    H/O radioactive iodine thyroid ablation    1992  right throid   History of exercise intolerance    01-052011  ETT--  normal w/ no ischemia per cardiologist (dr Aundra Dubin) epic note   History of pulmonary embolus (PE)    07/ 2008-- treated w/ coumadin   History of syncope    recurrent --  workup done per epic -- dx orthostatic hypotension   Hyperlipidemia    Hypertension    IBS (irritable bowel syndrome)    Neuromuscular disorder (HCC)    carpal tunnel   Pelvic pain in female    Post-surgical hypothyroidism    1991 left thyroidectomy   Pre-diabetes    Proteinuria    Seasonal and perennial allergic rhinitis    Wears contact lenses    Wears glasses    Past Surgical History:  Procedure Laterality Date   84 HOUR [place] STUDY N/A 11/16/2016   Procedure: 24 HOUR PH STUDY;  Surgeon: Doran Stabler, MD;  Location: [facility];  Service: Gastroenterology;  Laterality: N/A;   ABDOMINAL HYSTERECTOMY  1998   COLONOSCOPY     COLONOSCOPY WITH PROPOFOL N/A 08/05/2021   Procedure: COLONOSCOPY WITH PROPOFOL;  Surgeon: Sharyn Creamer, MD;  Location: [facility];  Service: Gastroenterology;  Laterality: N/A;   CYSTOSCOPY N/A 04/28/2016   Procedure: CYSTOSCOPY;  Surgeon: Arvella Nigh, MD;  Location: [facility];  Service: Gynecology;  Laterality: N/A;   ESOPHAGEAL MANOMETRY N/A 11/16/2016   Procedure: ESOPHAGEAL MANOMETRY (EM);  Surgeon: Doran Stabler, MD;  Location: [facility];  Service: Gastroenterology;  Laterality: N/A;   ESOPHAGOGASTRODUODENOSCOPY     LAPAROSCOPIC CHOLECYSTECTOMY  1997   LAPAROSCOPY N/A 03/26/2016   Procedure: LAPAROSCOPY DIAGNOSTIC, LYSIS OF ADHESIONS;  Surgeon: Arvella Nigh, MD;  Location: [place];  Service: Gynecology;  Laterality: N/A;   LAPAROTOMY N/A 04/28/2016   Procedure: EXPLORATORY LAPAROTOMY;  Surgeon: Arvella Nigh, MD;  Location: [facility];  Service: Gynecology;  Laterality: N/A;   RENAL BIOPSY     SALPINGOOPHORECTOMY Bilateral 04/28/2016   Procedure: Bilateral OOPHORECTOMY;  Surgeon: Arvella Nigh, MD;  Location: [facility];  Service: Gynecology;  Laterality: Bilateral;   THYROIDECTOMY Left 1991   TRANSTHORACIC ECHOCARDIOGRAM  04/27/2012  normal LV, ef 60-65%/  trivial MR   TUBAL LIGATION  1992   Patient Active Problem List   Diagnosis Date Noted   Hypomagnesemia 10/06/2021   Hypercalcemia 10/06/2021   Colon cancer screening    Bilateral carpal tunnel syndrome 01/07/2021   Flexor carpi radialis tendinitis 01/07/2021   Vitamin D deficiency 05/19/2019   IGT (impaired glucose tolerance) 05/19/2019   Exposure to medical diagnostic radiation 02/09/2017   S/P bilateral salpingo-oophorectomy 04/28/2016   Pelvic adhesions 03/26/2016    Class: Present on Admission   History of pulmonary embolism 08/01/2014   CKD (chronic kidney disease)  stage 3, GFR 30-59 ml/min (HCC) 02/27/2014   Hypothyroidism 04/28/2012   Hemorrhage of rectum and anus 02/09/2011   Constipation 02/09/2011   Dyslipidemia 08/26/2009   Backache 02/14/2008   Allergic rhinitis 11/15/2007   GERD 06/07/2007   Anxiety state 04/27/2007   Essential hypertension 04/27/2007   Disorder of kidney and ureter 03/17/2007   EMBOLISM/THROMBOSIS, DEEP VSL PRXML LWR EXTRM 03/16/2007    ONSET DATE: 1 year, chronic, insidious   REFERRING DIAG: M79.641,M79.642 (ICD-10-CM) - Bilateral hand pain   THERAPY DIAG:  Pain in right wrist  Pain in left wrist  Pain in joint of right hand  Muscle weakness (generalized)  Other lack of coordination  Stiffness of right wrist, not elsewhere classified  Stiffness of left hand, not elsewhere classified  Rationale for Evaluation and Treatment: Rehabilitation  SUBJECTIVE:   SUBJECTIVE STATEMENT: 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 She states dropping things, weakness, can't open up jars well, etc. she does have intermittent paresthesias in both hands, which she states is worse at night and she notes is associated with "sleeping on her hands."   PERTINENT HISTORY: Per MD notes: " cont'd bilat wrist pain, R>L. Pt was last seen by Dr. Georgina Snell on 09/26/21 and was given a R dorsal wrist steroid injection. Pt was last seen for bilat CTS on 01/07/21. Today, pt reports R dorsal wrist pain has returned/worsened over the last month. Pain is now coming up into her R hand and R thumb...52 y.o. 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  Plan on steroid injection into the right dorsal wrist radiocarpal joint and right first CMC injection. Additionally she has evidence of left carpal tunnel syndrome to explain her paresthesias.  Plan for carpal tunnel wrist brace at night."   PRECAUTIONS: None  WEIGHT BEARING RESTRICTIONS: No  PAIN:  Are you  having pain? Yes, mainly in Rt thumb and dorsum of wrist also Lt dorsum of wrist Rating: 5/10 at rest now, up to 7-8/10 at worst in past week after work   FALLS: Has patient fallen in last 6 months? No  PLOF: Independent  PATIENT GOALS: To have less pain and better motion and ability with both hands and wrists.   OBJECTIVE: (All objective assessments below are from initial evaluation on: 10/15/22 unless otherwise specified.)   HAND DOMINANCE: Right   ADLs: Overall ADLs: States decreased ability to grab, hold household objects, pain and difficulty to open containers, perform FMS tasks (manipulate fasteners on clothing)  FUNCTIONAL OUTCOME MEASURES: Eval: Quck DASH 23% impairment today  (Higher % Score  =  More Impairment)    UPPER EXTREMITY ROM     Shoulder to Wrist AROM Right eval Left eval  Forearm supination 72 71  Forearm pronation  80 75  Wrist  flexion 61 62  Wrist extension 66 70  Functional dart thrower's motion (F-DTM) in ulnar flexion 28 21  F-DTM in radial extension  69 40  (Blank rows = not tested)   Hand AROM Right eval Left eval  Full Fist Ability (or Gap to Distal Palmar Crease) yes yes  Thumb Opposition  (Kapandji Scale)  10 9  Thumb MCP (0-60) 61 54  Thumb IP (0-80) 52 41  Thumb Radial Abduction Span 5.8cm  6.2cm  (Blank rows = not tested)   UPPER EXTREMITY MMT:     MMT Right 10/15/22 Left 10/15/22  Forearm supination    Forearm pronation    Wrist flexion 5/5 4-/5 Pain in dorsum of wrist  Wrist extension 4/5 slight pain in dorsum of wrist 5/5 no pain  Wrist ulnar deviation 4+/5 4/5 tender in dorsum  Wrist radial deviation 5/5 4+/5  (Blank rows = not tested)  HAND FUNCTION: Eval: Observed weakness in affected hand.  Grip strength Right: 55 lbs pain, Left: 65 lbs   COORDINATION: Eval: Observed coordination impairments with affected hand. 9 Hole Peg Test Right: 21sec (dropped 1 peg), Left: 18.4 sec  SENSATION: Eval: Light touch intact  today  EDEMA:   Eval: None significant today  COGNITION: Eval: Overall cognitive status: WFL for evaluation today   OBSERVATIONS:   Eval: TTP at Rt CMC J (not Lt), and Lt extensor retinaculum/dorsal wrist. Feels tight in Lt T1 dorsally and also in Rt extensor retinaculum  Lt wrist: neg TFCC shear, neg piano key at DRUJ, tight DTM (tighter at mid-carpal joint), also shows [employer] symptoms with provocation tests  Rt wrist: tighter in radiocarpal joint and overt thumb CMC J issues   TODAY'S TREATMENT:  Post-evaluation treatment: She was given education for self care to avoid repetitive or painful postures or motions in both hands, use bracing of some type as needed for support.  She can also use heat modality to loosen up tight tendons or joint capsules to help with arthritis pain.  Additionally she was given the following home exercise program, and OT does these with her to ensure they are not painful only able to get through the bolded portions as below.  Additionally for occasional paresthesia which she states happens mostly in the night, she was educated to avoid sleeping on her hands, avoid bent elbow or shoulder/neck or overly bent wrist.  She states understanding and notes that she is aware that this has been a problem for her consistently in the past.  She states understanding today's information and leaves with no increase in pain.  Exercises - Seated Wrist Flexion Stretch  - 3 x daily - 3 reps - 15 hold - Wrist Prayer Stretch  - 3 x daily - 3 reps - 15 sec hold - Stretch Thumb DOWNWARD  - 3 x daily - 3 reps - 15 sec hold - Thumb Webspace Stretch  - 3 x daily - 3 reps - 15 sec hold - Towel Roll Grip with Forearm in Neutral  - 3 x daily - 5 reps - 10 sec hold - Spread Index Finger Apart  - 3 x daily - 5 reps - 5 sec hold - C-Strength (try using rubber band)   - 3 x daily - 5 reps - 5 sec hold  PATIENT EDUCATION: Education details: See tx section above for details  Person  educated: Patient Education method: Verbal Instruction, Teach back, Handouts  Education comprehension: States and demonstrates understanding, Additional Education required  HOME EXERCISE PROGRAM: Access Code: [email or web address] URL: [email or web address] Date: 10/15/2022 Prepared by: Benito Mccreedy   GOALS: Goals reviewed with patient? Yes   SHORT TERM GOALS: (STG required if POC>30 days) Target Date: 10/30/22  Pt will obtain protective, custom orthotic. Goal status: TBD/PRN  2.  Pt will demo/state understanding of initial HEP to improve pain levels and prerequisite motion. Goal status: INITIAL   LONG TERM GOALS: Target Date: 11/27/22  Pt will improve functional ability by decreased impairment per Quick DASH assessment from 23% to 10% or better, for better quality of life. Goal status: INITIAL  2.  Pt will improve grip strength in Rt hand from painful 55lbs to at least 65lbs for functional use at home and in IADLs. Goal status: INITIAL  3.  Pt will improve A/ROM in b/l wrist flexion from ~60* to at least 70*, to have functional motion for tasks like reach and grasp.  Goal status: INITIAL  4.  Pt will improve strength in Lt wrist flex from painful 4-/5 MMT to at least 4+/5 MMT to have increased functional ability to carry out selfcare and higher-level homecare tasks with no difficulty. Goal status: INITIAL  5.  Pt will improve coordination skills in Rt hand, as seen by better score on 9HPT from 21sec to 19sec to have increased functional ability to carry out fine motor tasks (fasteners, etc.) and more complex, coordinated IADLs (meal prep, sports, etc.).  Goal status: INITIAL  6.  Pt will decrease pain at worst from 7-8/10 to 2-3/10 or better to have better sleep and occupational participation in daily roles. Goal status: INITIAL   ASSESSMENT:  CLINICAL IMPRESSION: Patient is a 52 y.o. female who was seen today for occupational therapy evaluation for bilateral  wrist pain as well as right thumb pain and arthritis.  These things present as a mixture of arthritis and tendinitis complaints, and affect her function negatively.  She will benefit from outpatient occupational therapy to increase quality of life.   PERFORMANCE DEFICITS: in functional skills including ADLs, IADLs, coordination, dexterity, ROM, strength, pain, fascial restrictions, muscle spasms, flexibility, Fine motor control, body mechanics, endurance, and UE functional use, cognitive skills including problem solving and safety awareness, and psychosocial skills including coping strategies, environmental adaptation, and habits.   IMPAIRMENTS: are limiting patient from ADLs, IADLs, work, and leisure.   COMORBIDITIES: has co-morbidities such as anxiety, pre-diabetes, CKD II, HLD, HTN, IBS, CTS, and others  that affects occupational performance. Patient will benefit from skilled OT to address above impairments and improve overall function.  MODIFICATION OR ASSISTANCE TO COMPLETE EVALUATION: Min-Moderate modification of tasks or assist with assess necessary to complete an evaluation.  OT OCCUPATIONAL PROFILE AND HISTORY: Detailed assessment: Review of records and additional review of physical, cognitive, psychosocial history related to current functional performance.  CLINICAL DECISION MAKING: Moderate - several treatment options, min-mod task modification necessary  REHAB POTENTIAL: Excellent  EVALUATION COMPLEXITY: Moderate      PLAN:  OT FREQUENCY: 2x/week  OT DURATION: 6 weeks (through 11/27/22 as needed)   PLANNED INTERVENTIONS: self care/ADL training, therapeutic exercise, therapeutic activity, neuromuscular re-education, manual therapy, passive range of motion, splinting, ultrasound, compression bandaging, moist heat, cryotherapy, contrast bath, patient/family education, coping strategies training, and Dry needling  RECOMMENDED OTHER SERVICES: None now  CONSULTED AND AGREED WITH  PLAN OF CARE: Patient  PLAN FOR NEXT SESSION:  Review home exercises and continue education as well as use modalities and manual therapy as indicated to help relieve symptoms.   Benito Mccreedy,  OTR/L, CHT 10/15/2022, 4:05 PM

## 2022-10-13 ENCOUNTER — Encounter: Payer: BC Managed Care – PPO | Admitting: Rehabilitative and Restorative Service Providers"

## 2022-10-15 ENCOUNTER — Telehealth: Payer: Self-pay | Admitting: *Deleted

## 2022-10-15 ENCOUNTER — Ambulatory Visit (INDEPENDENT_AMBULATORY_CARE_PROVIDER_SITE_OTHER): Payer: BC Managed Care – PPO | Admitting: Rehabilitative and Restorative Service Providers"

## 2022-10-15 ENCOUNTER — Encounter: Payer: Self-pay | Admitting: Rehabilitative and Restorative Service Providers"

## 2022-10-15 ENCOUNTER — Other Ambulatory Visit: Payer: Self-pay

## 2022-10-15 DIAGNOSIS — R278 Other lack of coordination: Secondary | ICD-10-CM

## 2022-10-15 DIAGNOSIS — M25532 Pain in left wrist: Secondary | ICD-10-CM | POA: Diagnosis not present

## 2022-10-15 DIAGNOSIS — M25531 Pain in right wrist: Secondary | ICD-10-CM | POA: Diagnosis not present

## 2022-10-15 DIAGNOSIS — M6281 Muscle weakness (generalized): Secondary | ICD-10-CM

## 2022-10-15 DIAGNOSIS — M25642 Stiffness of left hand, not elsewhere classified: Secondary | ICD-10-CM

## 2022-10-15 DIAGNOSIS — M25541 Pain in joints of right hand: Secondary | ICD-10-CM | POA: Diagnosis not present

## 2022-10-15 DIAGNOSIS — M25631 Stiffness of right wrist, not elsewhere classified: Secondary | ICD-10-CM

## 2022-10-15 NOTE — Telephone Encounter (Signed)
New key: Key: [id number]

## 2022-10-15 NOTE — Telephone Encounter (Signed)
Prior Rhonda [name] has been started for Nexium  Key: [id number]

## 2022-10-19 NOTE — Therapy (Signed)
OUTPATIENT OCCUPATIONAL THERAPY TREATMENT NOTE  Patient Name: Rhonda [name] MRN: [id number] DOB:1971/01/27, 52 y.o., female Today's Date: 10/20/2022  PCP: Isaac Bliss, Olam Idler, MD REFERRING PROVIDER: Gregor Hams, MD   END OF SESSION:  OT End of Session - 10/20/22 1345     Visit Number 2    Number of Visits 10    Date for OT Re-Evaluation 11/27/22    Authorization Type BC[employer]    OT Start Time [id number]    OT Stop Time 1431    OT Time Calculation (min) 46 min    Activity Tolerance No increased pain;Patient limited by pain;Patient limited by fatigue;Patient tolerated treatment well    Behavior During Therapy [employer] for tasks assessed/performed              Past Medical History:  Diagnosis Date   Anxiety    C1q nephropathy    Cancer ([place])    history of thyroid cancer   Chronic constipation    CKD (chronic kidney disease), stage II nephrologist-  dr Gretchen Short [facility])   secondary to C1q nephropathy/  hypertension   Family history of adverse reaction to anesthesia    father hard to put down for anesthesia   GERD (gastroesophageal reflux disease)    H/O radioactive iodine thyroid ablation    1992  right throid   History of exercise intolerance    01-052011  ETT--  normal w/ no ischemia per cardiologist (dr Aundra Dubin) epic note   History of pulmonary embolus (PE)    07/ 2008-- treated w/ coumadin   History of syncope    recurrent --  workup done per epic -- dx orthostatic hypotension   Hyperlipidemia    Hypertension    IBS (irritable bowel syndrome)    Neuromuscular disorder (HCC)    carpal tunnel   Pelvic pain in female    Post-surgical hypothyroidism    1991 left thyroidectomy   Pre-diabetes    Proteinuria    Seasonal and perennial allergic rhinitis    Wears contact lenses    Wears glasses    Past Surgical History:  Procedure Laterality Date   39 HOUR [place] STUDY N/A 11/16/2016   Procedure: 24 HOUR PH STUDY;  Surgeon: Doran Stabler, MD;  Location: [facility];  Service: Gastroenterology;  Laterality: N/A;   ABDOMINAL HYSTERECTOMY  1998   COLONOSCOPY     COLONOSCOPY WITH PROPOFOL N/A 08/05/2021   Procedure: COLONOSCOPY WITH PROPOFOL;  Surgeon: Sharyn Creamer, MD;  Location: [facility];  Service: Gastroenterology;  Laterality: N/A;   CYSTOSCOPY N/A 04/28/2016   Procedure: CYSTOSCOPY;  Surgeon: Arvella Nigh, MD;  Location: [facility];  Service: Gynecology;  Laterality: N/A;   ESOPHAGEAL MANOMETRY N/A 11/16/2016   Procedure: ESOPHAGEAL MANOMETRY (EM);  Surgeon: Doran Stabler, MD;  Location: [facility];  Service: Gastroenterology;  Laterality: N/A;   ESOPHAGOGASTRODUODENOSCOPY     LAPAROSCOPIC CHOLECYSTECTOMY  1997   LAPAROSCOPY N/A 03/26/2016   Procedure: LAPAROSCOPY DIAGNOSTIC, LYSIS OF ADHESIONS;  Surgeon: Arvella Nigh, MD;  Location: [place];  Service: Gynecology;  Laterality: N/A;   LAPAROTOMY N/A 04/28/2016   Procedure: EXPLORATORY LAPAROTOMY;  Surgeon: Arvella Nigh, MD;  Location: [facility];  Service: Gynecology;  Laterality: N/A;   RENAL BIOPSY     SALPINGOOPHORECTOMY Bilateral 04/28/2016   Procedure: Bilateral OOPHORECTOMY;  Surgeon: Arvella Nigh, MD;  Location: [facility];  Service: Gynecology;  Laterality: Bilateral;   THYROIDECTOMY Left 1991   TRANSTHORACIC ECHOCARDIOGRAM  04/27/2012  normal LV, ef 60-65%/  trivial MR   TUBAL LIGATION  1992   Patient Active Problem List   Diagnosis Date Noted   Hypomagnesemia 10/06/2021   Hypercalcemia 10/06/2021   Colon cancer screening    Bilateral carpal tunnel syndrome 01/07/2021   Flexor carpi radialis tendinitis 01/07/2021   Vitamin D deficiency 05/19/2019   IGT (impaired glucose tolerance) 05/19/2019   Exposure to medical diagnostic radiation 02/09/2017   S/P bilateral salpingo-oophorectomy 04/28/2016   Pelvic adhesions 03/26/2016    Class: Present on Admission   History of pulmonary embolism 08/01/2014   CKD (chronic kidney disease)  stage 3, GFR 30-59 ml/min (HCC) 02/27/2014   Hypothyroidism 04/28/2012   Hemorrhage of rectum and anus 02/09/2011   Constipation 02/09/2011   Dyslipidemia 08/26/2009   Backache 02/14/2008   Allergic rhinitis 11/15/2007   GERD 06/07/2007   Anxiety state 04/27/2007   Essential hypertension 04/27/2007   Disorder of kidney and ureter 03/17/2007   EMBOLISM/THROMBOSIS, DEEP VSL PRXML LWR EXTRM 03/16/2007    ONSET DATE: 1 year, chronic, insidious   REFERRING DIAG: M79.641,M79.642 (ICD-10-CM) - Bilateral hand pain   THERAPY DIAG:  Pain in right wrist  Pain in left wrist  Pain in joint of right hand  Other lack of coordination  Stiffness of right wrist, not elsewhere classified  Stiffness of left hand, not elsewhere classified  Muscle weakness (generalized)  Rationale for Evaluation and Treatment: Rehabilitation  PERTINENT HISTORY: Per MD notes: " cont'd bilat wrist pain, R>L. Pt was last seen by Dr. Georgina Snell on 09/26/21 and was given a R dorsal wrist steroid injection. Pt was last seen for bilat CTS on 01/07/21. Today, pt reports R dorsal wrist pain has returned/worsened over the last month. Pain is now coming up into her R hand and R thumb...52 y.o. 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  Plan on steroid injection into the right dorsal wrist radiocarpal joint and right first CMC injection. Additionally she has evidence of left carpal tunnel syndrome to explain her paresthesias.  Plan for carpal tunnel wrist brace at night."   PRECAUTIONS: None; WEIGHT BEARING RESTRICTIONS: No  SUBJECTIVE:   SUBJECTIVE STATEMENT: She states she made a stew this morning and her hands/wrists were hurting a bit. She hasn't had much time to work on HEP yet.   PAIN:  Are you having pain?  Yes, mainly in Rt thumb and dorsum of wrist also Lt dorsum of wrist Rating: 6/10 at rest now  PATIENT GOALS: To have less pain and better motion and ability with both hands and wrists.   OBJECTIVE: (All  objective assessments below are from initial evaluation on: 10/15/22 unless otherwise specified.)   HAND DOMINANCE: Right   ADLs: Overall ADLs: States decreased ability to grab, hold household objects, pain and difficulty to open containers, perform FMS tasks (manipulate fasteners on clothing)  FUNCTIONAL OUTCOME MEASURES: Eval: Quck DASH 23% impairment today  (Higher % Score  =  More Impairment)    UPPER EXTREMITY ROM     Shoulder to Wrist AROM Right eval Left eval  Forearm supination 72 71  Forearm pronation  80 75  Wrist flexion 61 62  Wrist extension 66 70  Functional dart thrower's motion (F-DTM) in ulnar flexion 28 21  F-DTM in radial extension  69 40  (Blank rows = not tested)   Hand AROM Right eval Left eval  Full Fist Ability (or Gap to Distal Palmar Crease) yes yes  Thumb Opposition  (Kapandji Scale)  10 9  Thumb MCP (0-60) 61 54  Thumb IP (0-80) 52 41  Thumb Radial Abduction Span 5.8cm  6.2cm  (Blank rows = not tested)   UPPER EXTREMITY MMT:     MMT Right 10/15/22 Left 10/15/22  Forearm supination    Forearm pronation    Wrist flexion 5/5 4-/5 Pain in dorsum of wrist  Wrist extension 4/5 slight pain in dorsum of wrist 5/5 no pain  Wrist ulnar deviation 4+/5 4/5 tender in dorsum  Wrist radial deviation 5/5 4+/5  (Blank rows = not tested)  HAND FUNCTION: Eval: Observed weakness in affected hand.  Grip strength Right: 55 lbs pain, Left: 65 lbs   COORDINATION: Eval: Observed coordination impairments with affected hand. 9 Hole Peg Test Right: 21sec (dropped 1 peg), Left: 18.4 sec  OBSERVATIONS:   Eval: TTP at Rt CMC J (not Lt), and Lt extensor retinaculum/dorsal wrist. Feels tight in Lt T1 dorsally and also in Rt extensor retinaculum  Lt wrist: neg TFCC shear, neg piano key at DRUJ, tight DTM (tighter at mid-carpal joint), also shows De Quervain's symptoms with provocation tests  Rt wrist: tighter in radiocarpal joint and overt thumb CMC J  issues   TODAY'S TREATMENT:  10/20/22: First she goes on moist heat to bilateral wrists and hands for 5 minutes while OT reviews her home exercise program with her.  OT then performs manual therapy IASTM to her left forearm area to lengthen the tendons to the thumb that are seeming to create a tenosynovitis.  She states this feels pleasant and has less stiffness to the hand and wrist when done.  OT also performs manual stretches to both the left and right wrists also reviewing home exercise program and having her perform back for consistency.  She does not have any pain throughout these, and OT points out the differences this should be done between the right and the left thumb and wrist to address the separate issues and each.  She states understanding these but likely will need more review.  Additionally an ulnar deviation stretch was added in the left wrist and forearm today to work on the potential de Quervain's tenosynovitis, and she tolerates this well.  She states feeling better at the end of the session than when she arrived.  Exercises - Seated Wrist Flexion Stretch  - 3 x daily - 3 reps - 15 hold - Wrist Prayer Stretch  - 3 x daily - 3 reps - 15 sec hold - Wrist Radial Ulnar Deviation AROM  - 2-3 x daily - 3-5 reps - 15-20 sec hold - Stretch Thumb DOWNWARD  - 3 x daily - 3 reps - 15 sec hold - Thumb Webspace Stretch  - 3 x daily - 3 reps - 15 sec hold - Towel Roll Grip with Forearm in Neutral  - 3 x daily - 5 reps - 10 sec hold - Spread Index Finger Apart  - 3 x daily - 5 reps - 5 sec hold - C-Strength (try using rubber band)   - 3 x daily - 5 reps - 5 sec hold  PATIENT EDUCATION: Education details: See tx section above for details  Person educated: Patient Education method: Verbal Instruction, Teach back, Handouts  Education comprehension: States and demonstrates understanding, Additional Education required   HOME EXERCISE PROGRAM: Access Code: [email or web address] URL:  [email or web address] Date: 10/15/2022 Prepared by: Benito Mccreedy   GOALS: Goals reviewed with patient? Yes   SHORT TERM GOALS: (STG required if POC>30 days) Target Date:  10/30/22  Pt will obtain protective, custom orthotic. Goal status: TBD/PRN  2.  Pt will demo/state understanding of initial HEP to improve pain levels and prerequisite motion. Goal status: INITIAL   LONG TERM GOALS: Target Date: 11/27/22  Pt will improve functional ability by decreased impairment per Quick DASH assessment from 23% to 10% or better, for better quality of life. Goal status: INITIAL  2.  Pt will improve grip strength in Rt hand from painful 55lbs to at least 65lbs for functional use at home and in IADLs. Goal status: INITIAL  3.  Pt will improve A/ROM in b/l wrist flexion from ~60* to at least 70*, to have functional motion for tasks like reach and grasp.  Goal status: INITIAL  4.  Pt will improve strength in Lt wrist flex from painful 4-/5 MMT to at least 4+/5 MMT to have increased functional ability to carry out selfcare and higher-level homecare tasks with no difficulty. Goal status: INITIAL  5.  Pt will improve coordination skills in Rt hand, as seen by better score on 9HPT from 21sec to 19sec to have increased functional ability to carry out fine motor tasks (fasteners, etc.) and more complex, coordinated IADLs (meal prep, sports, etc.).  Goal status: INITIAL  6.  Pt will decrease pain at worst from 7-8/10 to 2-3/10 or better to have better sleep and occupational participation in daily roles. Goal status: INITIAL   ASSESSMENT:  CLINICAL IMPRESSION: 10/20/22: She now has a complete home exercise program to work on but this will need reviewed in the next session.  Continue plan of care as it seems to be helping her pain and symptoms though she will still have exacerbations at work and in her daily life most likely.  Eval: Patient is a 52 y.o. female who was seen today for  occupational therapy evaluation for bilateral wrist pain as well as right thumb pain and arthritis.  These things present as a mixture of arthritis and tendinitis complaints, and affect her function negatively.  She will benefit from outpatient occupational therapy to increase quality of life.    PLAN:  OT FREQUENCY: 2x/week  OT DURATION: 6 weeks (through 11/27/22 as needed)   PLANNED INTERVENTIONS: self care/ADL training, therapeutic exercise, therapeutic activity, neuromuscular re-education, manual therapy, passive range of motion, splinting, ultrasound, compression bandaging, moist heat, cryotherapy, contrast bath, patient/family education, coping strategies training, and Dry needling  CONSULTED AND AGREED WITH PLAN OF CARE: Patient  PLAN FOR NEXT SESSION:  Continue with modalities and manual therapy as indicated, continue to review home exercise program until she understands it independently, and upgrade to light eccentric strengthening or isometric strengthening when tolerated/pain stays low  Retia Cordle, OTR/L, CHT 10/20/2022, 5:39 PM

## 2022-10-20 ENCOUNTER — Ambulatory Visit (INDEPENDENT_AMBULATORY_CARE_PROVIDER_SITE_OTHER): Payer: BC Managed Care – PPO | Admitting: Rehabilitative and Restorative Service Providers"

## 2022-10-20 ENCOUNTER — Ambulatory Visit: Payer: BC Managed Care – PPO | Admitting: Orthopedic Surgery

## 2022-10-20 ENCOUNTER — Encounter: Payer: Self-pay | Admitting: Rehabilitative and Restorative Service Providers"

## 2022-10-20 DIAGNOSIS — M25531 Pain in right wrist: Secondary | ICD-10-CM

## 2022-10-20 DIAGNOSIS — M25642 Stiffness of left hand, not elsewhere classified: Secondary | ICD-10-CM

## 2022-10-20 DIAGNOSIS — M25532 Pain in left wrist: Secondary | ICD-10-CM

## 2022-10-20 DIAGNOSIS — M25631 Stiffness of right wrist, not elsewhere classified: Secondary | ICD-10-CM

## 2022-10-20 DIAGNOSIS — R278 Other lack of coordination: Secondary | ICD-10-CM | POA: Diagnosis not present

## 2022-10-20 DIAGNOSIS — M25541 Pain in joints of right hand: Secondary | ICD-10-CM

## 2022-10-20 DIAGNOSIS — M6281 Muscle weakness (generalized): Secondary | ICD-10-CM

## 2022-10-28 NOTE — Therapy (Signed)
OUTPATIENT OCCUPATIONAL THERAPY TREATMENT NOTE  Patient Name: Rhonda [name] MRN: [id number] DOB:[birth year], 52 y.o., female Today's Date: 10/29/2022  PCP: Isaac Bliss, Olam Idler, MD REFERRING PROVIDER: Gregor Hams, MD   END OF SESSION:  OT End of Session - 10/29/22 0852     Visit Number 3    Number of Visits 10    Date for OT Re-Evaluation 11/27/22    Authorization Type [employer]    OT Start Time 0852    OT Stop Time 0923    OT Time Calculation (min) 31 min    Equipment Utilized During Treatment KT Tape    Activity Tolerance No increased pain;Patient limited by pain;Patient limited by fatigue;Patient tolerated treatment well    Behavior During Therapy [facility] for tasks assessed/performed               Past Medical History:  Diagnosis Date   Anxiety    C1q nephropathy    Cancer ([place])    history of thyroid cancer   Chronic constipation    CKD (chronic kidney disease), stage II nephrologist-  dr Gretchen Short [facility])   secondary to C1q nephropathy/  hypertension   Family history of adverse reaction to anesthesia    father hard to put down for anesthesia   GERD (gastroesophageal reflux disease)    H/O radioactive iodine thyroid ablation    1992  right throid   History of exercise intolerance    01-052011  ETT--  normal w/ no ischemia per cardiologist (dr Aundra Dubin) epic note   History of pulmonary embolus (PE)    07/ 2008-- treated w/ coumadin   History of syncope    recurrent --  workup done per epic -- dx orthostatic hypotension   Hyperlipidemia    Hypertension    IBS (irritable bowel syndrome)    Neuromuscular disorder (HCC)    carpal tunnel   Pelvic pain in female    Post-surgical hypothyroidism    1991 left thyroidectomy   Pre-diabetes    Proteinuria    Seasonal and perennial allergic rhinitis    Wears contact lenses    Wears glasses    Past Surgical History:  Procedure Laterality Date   45 HOUR [place] STUDY N/A 11/16/2016    Procedure: 24 HOUR PH STUDY;  Surgeon: Doran Stabler, MD;  Location: [facility];  Service: Gastroenterology;  Laterality: N/A;   ABDOMINAL HYSTERECTOMY  1998   COLONOSCOPY     COLONOSCOPY WITH PROPOFOL N/A 08/05/2021   Procedure: COLONOSCOPY WITH PROPOFOL;  Surgeon: Sharyn Creamer, MD;  Location: [facility];  Service: Gastroenterology;  Laterality: N/A;   CYSTOSCOPY N/A 04/28/2016   Procedure: CYSTOSCOPY;  Surgeon: Arvella Nigh, MD;  Location: [facility];  Service: Gynecology;  Laterality: N/A;   ESOPHAGEAL MANOMETRY N/A 11/16/2016   Procedure: ESOPHAGEAL MANOMETRY (EM);  Surgeon: Doran Stabler, MD;  Location: [facility];  Service: Gastroenterology;  Laterality: N/A;   ESOPHAGOGASTRODUODENOSCOPY     LAPAROSCOPIC CHOLECYSTECTOMY  1997   LAPAROSCOPY N/A 03/26/2016   Procedure: LAPAROSCOPY DIAGNOSTIC, LYSIS OF ADHESIONS;  Surgeon: Arvella Nigh, MD;  Location: [place];  Service: Gynecology;  Laterality: N/A;   LAPAROTOMY N/A 04/28/2016   Procedure: EXPLORATORY LAPAROTOMY;  Surgeon: Arvella Nigh, MD;  Location: [facility];  Service: Gynecology;  Laterality: N/A;   RENAL BIOPSY     SALPINGOOPHORECTOMY Bilateral 04/28/2016   Procedure: Bilateral OOPHORECTOMY;  Surgeon: Arvella Nigh, MD;  Location: [facility];  Service: Gynecology;  Laterality: Bilateral;  THYROIDECTOMY Left 1991   TRANSTHORACIC ECHOCARDIOGRAM  04/27/2012   normal LV, ef 60-65%/  trivial MR   TUBAL LIGATION  1992   Patient Active Problem List   Diagnosis Date Noted   Hypomagnesemia 10/06/2021   Hypercalcemia 10/06/2021   Colon cancer screening    Bilateral carpal tunnel syndrome 01/07/2021   Flexor carpi radialis tendinitis 01/07/2021   Vitamin D deficiency 05/19/2019   IGT (impaired glucose tolerance) 05/19/2019   Exposure to medical diagnostic radiation 02/09/2017   S/P bilateral salpingo-oophorectomy 04/28/2016   Pelvic adhesions 03/26/2016    Class: Present on Admission   History of pulmonary  embolism 08/01/2014   CKD (chronic kidney disease) stage 3, GFR 30-59 ml/min (HCC) 02/27/2014   Hypothyroidism 04/28/2012   Hemorrhage of rectum and anus 02/09/2011   Constipation 02/09/2011   Dyslipidemia 08/26/2009   Backache 02/14/2008   Allergic rhinitis 11/15/2007   GERD 06/07/2007   Anxiety state 04/27/2007   Essential hypertension 04/27/2007   Disorder of kidney and ureter 03/17/2007   EMBOLISM/THROMBOSIS, DEEP VSL PRXML LWR EXTRM 03/16/2007    ONSET DATE: 1 year, chronic, insidious   REFERRING DIAG: M79.641,M79.642 (ICD-10-CM) - Bilateral hand pain   THERAPY DIAG:  Pain in right wrist  Pain in left wrist  Pain in joint of right hand  Other lack of coordination  Stiffness of right wrist, not elsewhere classified  Stiffness of left hand, not elsewhere classified  Muscle weakness (generalized)  Rationale for Evaluation and Treatment: Rehabilitation  PERTINENT HISTORY: Per MD notes: " cont'd bilat wrist pain, R>L. Pt was last seen by Dr. Georgina Snell on 09/26/21 and was given a R dorsal wrist steroid injection. Pt was last seen for bilat CTS on 01/07/21. Today, pt reports R dorsal wrist pain has returned/worsened over the last month. Pain is now coming up into her R hand and R thumb...52 y.o. 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  Plan on steroid injection into the right dorsal wrist radiocarpal joint and right first CMC injection. Additionally she has evidence of left carpal tunnel syndrome to explain her paresthesias.  Plan for carpal tunnel wrist brace at night."   PRECAUTIONS: None; WEIGHT BEARING RESTRICTIONS: No  SUBJECTIVE:   SUBJECTIVE STATEMENT: She states just arriving from work and feeling a little sore and aching, but slightly better than in the past.  Left arm is feeling much better now in right hand and thumb of the biggest issues at this point.   PAIN:  Are you having pain?  Yes, mainly in Rt thumb and dorsum of wrist also Lt dorsum of  wrist Rating: 5/10 at rest now  PATIENT GOALS: To have less pain and better motion and ability with both hands and wrists.   OBJECTIVE: (All objective assessments below are from initial evaluation on: 10/15/22 unless otherwise specified.)   HAND DOMINANCE: Right   ADLs: Overall ADLs: States decreased ability to grab, hold household objects, pain and difficulty to open containers, perform FMS tasks (manipulate fasteners on clothing)  FUNCTIONAL OUTCOME MEASURES: Eval: Quck DASH 23% impairment today  (Higher % Score  =  More Impairment)    UPPER EXTREMITY ROM     Shoulder to Wrist AROM Right eval Left eval Lt  10/29/22  Forearm supination 72 71   Forearm pronation  80 75   Wrist flexion 61 62   55* (tight today!)  Wrist extension 66 70  66* Lt  Functional dart thrower's motion (F-DTM) in ulnar flexion 28 21  F-DTM in radial extension  69 40   (Blank rows = not tested)   Hand AROM Right eval Left eval  Full Fist Ability (or Gap to Distal Palmar Crease) yes yes  Thumb Opposition  (Kapandji Scale)  10 9  Thumb MCP (0-60) 61 54  Thumb IP (0-80) 52 41  Thumb Radial Abduction Span 5.8cm  6.2cm  (Blank rows = not tested)   UPPER EXTREMITY MMT:     MMT Right 10/15/22 Left 10/15/22  Forearm supination    Forearm pronation    Wrist flexion 5/5 4-/5 Pain in dorsum of wrist  Wrist extension 4/5 slight pain in dorsum of wrist 5/5 no pain  Wrist ulnar deviation 4+/5 4/5 tender in dorsum  Wrist radial deviation 5/5 4+/5  (Blank rows = not tested)  HAND FUNCTION: Eval: Observed weakness in affected hand.  Grip strength Right: 55 lbs pain, Left: 65 lbs   COORDINATION: Eval: Observed coordination impairments with affected hand. 9 Hole Peg Test Right: 21sec (dropped 1 peg), Left: 18.4 sec  OBSERVATIONS:   Eval: TTP at Rt CMC J (not Lt), and Lt extensor retinaculum/dorsal wrist. Feels tight in Lt T1 dorsally and also in Rt extensor retinaculum  Lt wrist: neg TFCC shear, neg  piano key at DRUJ, tight DTM (tighter at mid-carpal joint), also shows De Quervain's symptoms with provocation tests  Rt wrist: tighter in radiocarpal joint and overt thumb CMC J issues   TODAY'S TREATMENT:  10/29/22: With moist heat applied to right hand and thumb, OT does manual therapy IASTM to left wrist dorsal compartments and review of home exercise stretches and management of Decore veins tenosynovitis.  She is much less tender in the left arm today and tolerates all of these things well but does show some stiffness with new active range of motion measure.  She is cautioned that unchecked stiffness will lead to recurrence of pain.  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  She states this tape feels supportive to her thumb and then she performs strengthening techniques as listed below, tolerating very well with no increase in pain.  OT tells her to that we may start to review her goals and think about when maximum potential is met from therapy.  She should think of any other questions and concerns she may have for the upcoming sessions.  She states all things are being addressed and she is satisfied at this time.  Exercises - Seated Wrist Flexion Stretch  - 3 x daily - 3 reps - 15 hold - Wrist Prayer Stretch  - 3 x daily - 3 reps - 15 sec hold - Wrist Radial Ulnar Deviation AROM  - 2-3 x daily - 3-5 reps - 15-20 sec hold - Stretch Thumb DOWNWARD  - 3 x daily - 3 reps - 15 sec hold - Thumb Webspace Stretch  - 3 x daily - 3 reps - 15 sec hold - Towel Roll Grip with Forearm in Neutral  - 3 x daily - 5 reps - 10 sec hold - Spread Index Finger Apart  - 3 x daily - 5 reps - 5 sec hold - C-Strength (try using rubber band)   - 3 x daily - 5 reps - 5 sec hold   PATIENT EDUCATION: Education details: See  tx section  above for details  Person educated: Patient Education method: Verbal Instruction, Teach back, Handouts  Education comprehension: States and demonstrates understanding, Additional Education required   HOME EXERCISE PROGRAM: Access Code: [email or web address] URL: [email or web address] Date: 10/15/2022 Prepared by: Benito Mccreedy   GOALS: Goals reviewed with patient? Yes   SHORT TERM GOALS: (STG required if POC>30 days) Target Date: 10/30/22  Pt will obtain protective, custom orthotic. Goal status: TBD/PRN  2.  Pt will demo/state understanding of initial HEP to improve pain levels and prerequisite motion. Goal status: 10/29/22: MET   LONG TERM GOALS: Target Date: 11/27/22  Pt will improve functional ability by decreased impairment per Quick DASH assessment from 23% to 10% or better, for better quality of life. Goal status: INITIAL  2.  Pt will improve grip strength in Rt hand from painful 55lbs to at least 65lbs for functional use at home and in IADLs. Goal status: INITIAL  3.  Pt will improve A/ROM in b/l wrist flexion from ~60* to at least 70*, to have functional motion for tasks like reach and grasp.  Goal status: INITIAL  4.  Pt will improve strength in Lt wrist flex from painful 4-/5 MMT to at least 4+/5 MMT to have increased functional ability to carry out selfcare and higher-level homecare tasks with no difficulty. Goal status: INITIAL  5.  Pt will improve coordination skills in Rt hand, as seen by better score on 9HPT from 21sec to 19sec to have increased functional ability to carry out fine motor tasks (fasteners, etc.) and more complex, coordinated IADLs (meal prep, sports, etc.).  Goal status: INITIAL  6.  Pt will decrease pain at worst from 7-8/10 to 2-3/10 or better to have better sleep and occupational participation in daily roles. Goal status: INITIAL   ASSESSMENT:  CLINICAL IMPRESSION: 10/29/22: Although the pain is greatly reduced and she is no longer  tender in the left arm, her wrist was a bit tight when measured so she was cautioned about this to keep on her home exercise program is much as possible.  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  Hopefully K tape helps and she remains consistent with HEP there as well.  She understands home exercise program now very well   PLAN:  OT FREQUENCY: 2x/week  OT DURATION: 6 weeks (through 11/27/22 as needed)   PLANNED INTERVENTIONS: self care/ADL training, therapeutic exercise, therapeutic activity, neuromuscular re-education, manual therapy, passive range of motion, splinting, ultrasound, compression bandaging, moist heat, cryotherapy, contrast bath, patient/family education, coping strategies training, and Dry needling  CONSULTED AND AGREED WITH PLAN OF CARE: Patient  PLAN FOR NEXT SESSION:  Review home exercises as needed reviewed use of K tape and reapply if needed, use manual therapies and modalities as needed check: Any functional deficits consider progress note and discharge whenever she is comfortable and max progress has been met from OT. Can do upgraded strength in wrists/FA/upper arms as tolerated in next few sessions.   Benito Mccreedy, OTR/L, CHT 10/29/2022, 9:37 AM

## 2022-10-29 ENCOUNTER — Ambulatory Visit (INDEPENDENT_AMBULATORY_CARE_PROVIDER_SITE_OTHER): Payer: BC Managed Care – PPO | Admitting: Rehabilitative and Restorative Service Providers"

## 2022-10-29 ENCOUNTER — Encounter: Payer: Self-pay | Admitting: Rehabilitative and Restorative Service Providers"

## 2022-10-29 DIAGNOSIS — M25541 Pain in joints of right hand: Secondary | ICD-10-CM

## 2022-10-29 DIAGNOSIS — M25631 Stiffness of right wrist, not elsewhere classified: Secondary | ICD-10-CM

## 2022-10-29 DIAGNOSIS — R278 Other lack of coordination: Secondary | ICD-10-CM

## 2022-10-29 DIAGNOSIS — M6281 Muscle weakness (generalized): Secondary | ICD-10-CM

## 2022-10-29 DIAGNOSIS — M25531 Pain in right wrist: Secondary | ICD-10-CM | POA: Diagnosis not present

## 2022-10-29 DIAGNOSIS — M25532 Pain in left wrist: Secondary | ICD-10-CM

## 2022-10-29 DIAGNOSIS — M25642 Stiffness of left hand, not elsewhere classified: Secondary | ICD-10-CM

## 2022-11-02 ENCOUNTER — Ambulatory Visit: Payer: BC Managed Care – PPO | Admitting: Family Medicine

## 2022-11-02 NOTE — Therapy (Signed)
OUTPATIENT OCCUPATIONAL THERAPY TREATMENT NOTE  Patient Name: Rhonda [name] MRN: [id number] DOB:Oct 28, 1970, 52 y.o., female Today's Date: 11/03/2022  PCP: Isaac Bliss, Olam Idler, MD REFERRING PROVIDER: Gregor Hams, MD   END OF SESSION:  OT End of Session - 11/03/22 0841     Visit Number 4    Number of Visits 10    Date for OT Re-Evaluation 11/27/22    Authorization Type [employer]    OT Start Time 0841    OT Stop Time 0924    OT Time Calculation (min) 43 min    Equipment Utilized During Treatment KT Tape    Activity Tolerance No increased pain;Patient limited by fatigue;Patient tolerated treatment well    Behavior During Therapy [facility] for tasks assessed/performed                Past Medical History:  Diagnosis Date   Anxiety    C1q nephropathy    Cancer ([place])    history of thyroid cancer   Chronic constipation    CKD (chronic kidney disease), stage II nephrologist-  dr Gretchen Short [facility])   secondary to C1q nephropathy/  hypertension   Family history of adverse reaction to anesthesia    father hard to put down for anesthesia   GERD (gastroesophageal reflux disease)    H/O radioactive iodine thyroid ablation    1992  right throid   History of exercise intolerance    01-052011  ETT--  normal w/ no ischemia per cardiologist (dr Aundra Dubin) epic note   History of pulmonary embolus (PE)    07/ 2008-- treated w/ coumadin   History of syncope    recurrent --  workup done per epic -- dx orthostatic hypotension   Hyperlipidemia    Hypertension    IBS (irritable bowel syndrome)    Neuromuscular disorder (HCC)    carpal tunnel   Pelvic pain in female    Post-surgical hypothyroidism    1991 left thyroidectomy   Pre-diabetes    Proteinuria    Seasonal and perennial allergic rhinitis    Wears contact lenses    Wears glasses    Past Surgical History:  Procedure Laterality Date   23 HOUR [place] STUDY N/A 11/16/2016   Procedure: 24 HOUR PH  STUDY;  Surgeon: Doran Stabler, MD;  Location: [facility];  Service: Gastroenterology;  Laterality: N/A;   ABDOMINAL HYSTERECTOMY  1998   COLONOSCOPY     COLONOSCOPY WITH PROPOFOL N/A 08/05/2021   Procedure: COLONOSCOPY WITH PROPOFOL;  Surgeon: Sharyn Creamer, MD;  Location: [facility];  Service: Gastroenterology;  Laterality: N/A;   CYSTOSCOPY N/A 04/28/2016   Procedure: CYSTOSCOPY;  Surgeon: Arvella Nigh, MD;  Location: [facility];  Service: Gynecology;  Laterality: N/A;   ESOPHAGEAL MANOMETRY N/A 11/16/2016   Procedure: ESOPHAGEAL MANOMETRY (EM);  Surgeon: Doran Stabler, MD;  Location: [facility];  Service: Gastroenterology;  Laterality: N/A;   ESOPHAGOGASTRODUODENOSCOPY     LAPAROSCOPIC CHOLECYSTECTOMY  1997   LAPAROSCOPY N/A 03/26/2016   Procedure: LAPAROSCOPY DIAGNOSTIC, LYSIS OF ADHESIONS;  Surgeon: Arvella Nigh, MD;  Location: [place];  Service: Gynecology;  Laterality: N/A;   LAPAROTOMY N/A 04/28/2016   Procedure: EXPLORATORY LAPAROTOMY;  Surgeon: Arvella Nigh, MD;  Location: [facility];  Service: Gynecology;  Laterality: N/A;   RENAL BIOPSY     SALPINGOOPHORECTOMY Bilateral 04/28/2016   Procedure: Bilateral OOPHORECTOMY;  Surgeon: Arvella Nigh, MD;  Location: [facility];  Service: Gynecology;  Laterality: Bilateral;   THYROIDECTOMY Left  1991   TRANSTHORACIC ECHOCARDIOGRAM  04/27/2012   normal LV, ef 60-65%/  trivial MR   TUBAL LIGATION  1992   Patient Active Problem List   Diagnosis Date Noted   Hypomagnesemia 10/06/2021   Hypercalcemia 10/06/2021   Colon cancer screening    Bilateral carpal tunnel syndrome 01/07/2021   Flexor carpi radialis tendinitis 01/07/2021   Vitamin D deficiency 05/19/2019   IGT (impaired glucose tolerance) 05/19/2019   Exposure to medical diagnostic radiation 02/09/2017   S/P bilateral salpingo-oophorectomy 04/28/2016   Pelvic adhesions 03/26/2016    Class: Present on Admission   History of pulmonary embolism 08/01/2014   CKD  (chronic kidney disease) stage 3, GFR 30-59 ml/min (HCC) 02/27/2014   Hypothyroidism 04/28/2012   Hemorrhage of rectum and anus 02/09/2011   Constipation 02/09/2011   Dyslipidemia 08/26/2009   Backache 02/14/2008   Allergic rhinitis 11/15/2007   GERD 06/07/2007   Anxiety state 04/27/2007   Essential hypertension 04/27/2007   Disorder of kidney and ureter 03/17/2007   EMBOLISM/THROMBOSIS, DEEP VSL PRXML LWR EXTRM 03/16/2007    ONSET DATE: 1 year, chronic, insidious   REFERRING DIAG: M79.641,M79.642 (ICD-10-CM) - Bilateral hand pain   THERAPY DIAG:  Pain in right wrist  Pain in left wrist  Pain in joint of right hand  Stiffness of right wrist, not elsewhere classified  Other lack of coordination  Muscle weakness (generalized)  Stiffness of left hand, not elsewhere classified  Rationale for Evaluation and Treatment: Rehabilitation  PERTINENT HISTORY: Per MD notes: " cont'd bilat wrist pain, R>L. Pt was last seen by Dr. Georgina Snell on 09/26/21 and was given a R dorsal wrist steroid injection. Pt was last seen for bilat CTS on 01/07/21. Today, pt reports R dorsal wrist pain has returned/worsened over the last month. Pain is now coming up into her R hand and R thumb...52 y.o. 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  Plan on steroid injection into the right dorsal wrist radiocarpal joint and right first CMC injection. Additionally she has evidence of left carpal tunnel syndrome to explain her paresthesias.  Plan for carpal tunnel wrist brace at night."   PRECAUTIONS: None; WEIGHT BEARING RESTRICTIONS: No  SUBJECTIVE:   SUBJECTIVE STATEMENT: She states still feeling good in Lt compared to Rt arms today.  She states k-tape helped support her thumb under her glove at work.    PAIN:  Are you having pain?  Yes, mainly in Rt thumb and dorsum of wrist also Lt dorsum of wrist Rating: 4-5/10 at rest now  PATIENT GOALS: To have less pain and better motion and ability with both  hands and wrists.   OBJECTIVE: (All objective assessments below are from initial evaluation on: 10/15/22 unless otherwise specified.)   HAND DOMINANCE: Right   ADLs: Overall ADLs: States decreased ability to grab, hold household objects, pain and difficulty to open containers, perform FMS tasks (manipulate fasteners on clothing)  FUNCTIONAL OUTCOME MEASURES: Eval: Quck DASH 23% impairment today  (Higher % Score  =  More Impairment)    UPPER EXTREMITY ROM     Shoulder to Wrist AROM Right eval Left eval Lt  10/29/22  Forearm supination 72 71   Forearm pronation  80 75   Wrist flexion 61 62   55* (tight today!)  Wrist extension 66 70  66* Lt  Functional dart thrower's motion (F-DTM) in ulnar flexion 28 21   F-DTM in radial extension  69 40   (Blank rows = not tested)  Hand AROM Right eval Left eval  Full Fist Ability (or Gap to Distal Palmar Crease) yes yes  Thumb Opposition  (Kapandji Scale)  10 9  Thumb MCP (0-60) 61 54  Thumb IP (0-80) 52 41  Thumb Radial Abduction Span 5.8cm  6.2cm  (Blank rows = not tested)   UPPER EXTREMITY MMT:     MMT Right 10/15/22 Left 10/15/22  Forearm supination    Forearm pronation    Wrist flexion 5/5 4-/5 Pain in dorsum of wrist  Wrist extension 4/5 slight pain in dorsum of wrist 5/5 no pain  Wrist ulnar deviation 4+/5 4/5 tender in dorsum  Wrist radial deviation 5/5 4+/5  (Blank rows = not tested)  HAND FUNCTION: Eval: Observed weakness in affected hand.  Grip strength Right: 55 lbs pain, Left: 65 lbs   COORDINATION: Eval: Observed coordination impairments with affected hand. 9 Hole Peg Test Right: 21sec (dropped 1 peg), Left: 18.4 sec  OBSERVATIONS:   Eval: TTP at Rt CMC J (not Lt), and Lt extensor retinaculum/dorsal wrist. Feels tight in Lt T1 dorsally and also in Rt extensor retinaculum  Lt wrist: neg TFCC shear, neg piano key at DRUJ, tight DTM (tighter at mid-carpal joint), also shows De Quervain's symptoms with  provocation tests  Rt wrist: tighter in radiocarpal joint and overt thumb CMC J issues   TODAY'S TREATMENT:  11/03/22: With right hand on moist heat, OT reviews home exercise program for both right and left arms, then performs manual therapy stretches with exercise review for the right thumb and wrist.  OT also does manual therapy stretches with exercise review for the left wrist and forearm, then educates on new strengthening for the left arm.  She performs these strength back (as below) isometrically and concentrically, doing well with both today.  She was asked to try these once or twice a day every other day for 2 to 3 weeks to build up the strength in her left arm safely for no tendinitis flareups.  She states understanding all directions, and OT applies K tape against the right thumb and the left wrist and she states that she found this beneficial while working.  Exercises - Isometric Wrist Extension Pronated  - 1-2 x daily - 5 reps - 5-10 seconds hold - Hammer Stretch or Strength   - 2-4 x daily - 1-2 sets - 10-15 reps - Wrist Extension with Resistance  - 2-4 x daily - 1-2 sets - 10-15 reps - Wrist Flexion with Resistance  - 2-4 x daily - 1-2 sets - 10-15 reps   PATIENT EDUCATION: Education details: See tx section above for details  Person educated: Patient Education method: Verbal Instruction, Teach back, Handouts  Education comprehension: States and demonstrates understanding, Additional Education required   HOME EXERCISE PROGRAM: Access Code: [email or web address] URL: [email or web address]    GOALS: Goals reviewed with patient? Yes   SHORT TERM GOALS: (STG required if POC>30 days) Target Date: 10/30/22  Pt will obtain protective, custom orthotic. Goal status: TBD/PRN  2.  Pt will demo/state understanding of initial HEP to improve pain levels and prerequisite motion. Goal status: 10/29/22: MET   LONG TERM GOALS: Target Date: 11/27/22  Pt will improve functional  ability by decreased impairment per Quick DASH assessment from 23% to 10% or better, for better quality of life. Goal status: INITIAL  2.  Pt will improve grip strength in Rt hand from painful 55lbs to at least 65lbs for functional use at home and in IADLs. Goal status: INITIAL  3.  Pt will improve A/ROM in b/l wrist flexion from ~60* to at least 70*, to have functional motion for tasks like reach and grasp.  Goal status: INITIAL  4.  Pt will improve strength in Lt wrist flex from painful 4-/5 MMT to at least 4+/5 MMT to have increased functional ability to carry out selfcare and higher-level homecare tasks with no difficulty. Goal status: INITIAL  5.  Pt will improve coordination skills in Rt hand, as seen by better score on 9HPT from 21sec to 19sec to have increased functional ability to carry out fine motor tasks (fasteners, etc.) and more complex, coordinated IADLs (meal prep, sports, etc.).  Goal status: INITIAL  6.  Pt will decrease pain at worst from 7-8/10 to 2-3/10 or better to have better sleep and occupational participation in daily roles. Goal status: INITIAL   ASSESSMENT:  CLINICAL IMPRESSION: 11/03/22: She is now tolerating strengthening isometrically and concentrically in the left wrist and forearm, and also has a great plan involving pain relief, stretches, and strength to the right hand.  As long as she can manage well and keep progressing, she should discharge early within the next 1-2 visits.  10/29/22: Although the pain is greatly reduced and she is no longer tender in the left arm, her wrist was a bit tight when measured so she was cautioned about this to keep on her home exercise program is much as possible.  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  Hopefully K tape helps and she remains consistent with HEP there as well.  She understands home exercise program now very well   PLAN:  OT FREQUENCY:  2x/week  OT DURATION: 6 weeks (through 11/27/22 as needed)   PLANNED INTERVENTIONS: self care/ADL training, therapeutic exercise, therapeutic activity, neuromuscular re-education, manual therapy, passive range of motion, splinting, ultrasound, compression bandaging, moist heat, cryotherapy, contrast bath, patient/family education, coping strategies training, and Dry needling  CONSULTED AND AGREED WITH PLAN OF CARE: Patient  PLAN FOR NEXT SESSION:  Continue plan of care reviewing new strengthening in the left arm and trying to measure range of motion, strength, grip and functional ability consider discharge when suitable.  Benito Mccreedy, OTR/L, CHT 11/03/2022, 9:41 AM

## 2022-11-03 ENCOUNTER — Ambulatory Visit (INDEPENDENT_AMBULATORY_CARE_PROVIDER_SITE_OTHER): Payer: BC Managed Care – PPO | Admitting: Rehabilitative and Restorative Service Providers"

## 2022-11-03 ENCOUNTER — Encounter: Payer: Self-pay | Admitting: Rehabilitative and Restorative Service Providers"

## 2022-11-03 ENCOUNTER — Ambulatory Visit: Payer: BC Managed Care – PPO | Admitting: Family Medicine

## 2022-11-03 DIAGNOSIS — M25642 Stiffness of left hand, not elsewhere classified: Secondary | ICD-10-CM

## 2022-11-03 DIAGNOSIS — M25631 Stiffness of right wrist, not elsewhere classified: Secondary | ICD-10-CM | POA: Diagnosis not present

## 2022-11-03 DIAGNOSIS — R278 Other lack of coordination: Secondary | ICD-10-CM

## 2022-11-03 DIAGNOSIS — M6281 Muscle weakness (generalized): Secondary | ICD-10-CM

## 2022-11-03 DIAGNOSIS — M25531 Pain in right wrist: Secondary | ICD-10-CM

## 2022-11-03 DIAGNOSIS — M25532 Pain in left wrist: Secondary | ICD-10-CM | POA: Diagnosis not present

## 2022-11-03 DIAGNOSIS — M25541 Pain in joints of right hand: Secondary | ICD-10-CM

## 2022-11-12 ENCOUNTER — Encounter: Payer: BC Managed Care – PPO | Admitting: Rehabilitative and Restorative Service Providers"

## 2022-11-17 ENCOUNTER — Encounter: Payer: BC Managed Care – PPO | Admitting: Rehabilitative and Restorative Service Providers"

## 2022-11-18 ENCOUNTER — Ambulatory Visit (INDEPENDENT_AMBULATORY_CARE_PROVIDER_SITE_OTHER): Payer: BC Managed Care – PPO | Admitting: Physician Assistant

## 2022-11-18 ENCOUNTER — Encounter: Payer: Self-pay | Admitting: Physician Assistant

## 2022-11-18 VITALS — BP 114/70 | HR 80 | Ht 65.0 in | Wt 196.2 lb

## 2022-11-18 DIAGNOSIS — K582 Mixed irritable bowel syndrome: Secondary | ICD-10-CM | POA: Diagnosis not present

## 2022-11-18 NOTE — Patient Instructions (Addendum)
Increase IBgard 1 tablet twice daily for 3-4 days then increase to 2 tablets twice daily.   Send Mychart with update in 2 weeks.   _______________________________________________________  If your blood pressure at your visit was 140/90 or greater, please contact your primary care physician to follow up on this.  _______________________________________________________  If you are age 54 or older, your body mass index should be between 23-30. Your Body mass index is 32.66 kg/m. If this is out of the aforementioned range listed, please consider follow up with your Primary Care Provider.  If you are age 14 or younger, your body mass index should be between 19-25. Your Body mass index is 32.66 kg/m. If this is out of the aformentioned range listed, please consider follow up with your Primary Care Provider.   ________________________________________________________  The [facility] providers would like to encourage you to use [facility] to communicate with providers for non-urgent requests or questions.  Due to long hold times on the telephone, sending your provider a message by [facility] may be a faster and more efficient way to get a response.  Please allow 48 business hours for a response.  Please remember that this is for non-urgent requests.  _______________________________________________________

## 2022-11-18 NOTE — Progress Notes (Signed)
Chief Complaint: Diarrhea and nausea  HPI:    Rhonda [name] is a 52 year old African-American female with a past medical history as listed below including difficult intubation, CKD stage II, GERD and multiple others, known to Dr. Loletha Carrow, who presents to clinic today with diarrhea and nausea.    10/2016 EGD was normal.    03/21/2019 patient seen in clinic by Dr. Loletha Carrow.  At that time discussed being diagnosed with IBS with constipation as well as heartburn, epigastric and chest fullness after meals.  Little improvement on multiple PPIs over the years, April 2018 pH impedance and esophageal manometry off acid suppression with no reflux.  Gastric emptying studies in 2012 and 2008 were normal.  That office visit discussed she was feeling similar with chronic nausea and intermittent feelings of regurgitation and heartburn.  Alternating constipation/diarrhea.  That time diagnosed with longstanding functional bowel symptoms that had flared recently.  She was given a trial of Zofran 8 mg up to 3 times a day as needed.  Also given samples of IBgard 2 capsules twice daily.  Discussed possible trial of buspirone if this did not work.    03/28/2020 patient called in with same symptoms.  She was started on Buspirone 15 mg.  Instructed start with half tablet at bedtime and then increase to whole tablet at bedtime if no side effects.    08/05/2021 colonoscopy with a few erosions in the cecum, nonbleeding internal hemorrhoids and otherwise normal.  Repeat recommended in 10 years.    02/13/2022 CT head without contrast with no acute abnormality.    Today, the patient tells me that she had been doing well GI wise with her IBS just taking IBgard 1 tab once daily.  She does not recall ever taking the Buspirone and thinks there may have been an issue with her insurance or she did not want to start a med for anxiety.  Over the past month though she has had an increase in abdominal discomfort and cramping as well as feeling  like her stomach is "rumbling like a volcano".  She does endorse some increased stress and anxiety lately.  She is not sure what to do.  Bowel movements are slightly softer than normal but not diarrhea.    Denies fever, chills, weight loss, nausea or vomiting.  Past Medical History:  Diagnosis Date   Anxiety    C1q nephropathy    Cancer ([place])    history of thyroid cancer   Chronic constipation    CKD (chronic kidney disease), stage II nephrologist-  dr Gretchen Short [facility])   secondary to C1q nephropathy/  hypertension   Family history of adverse reaction to anesthesia    father hard to put down for anesthesia   GERD (gastroesophageal reflux disease)    H/O radioactive iodine thyroid ablation    1992  right throid   History of exercise intolerance    [id number]  ETT--  normal w/ no ischemia per cardiologist (dr Aundra Dubin) epic note   History of pulmonary embolus (PE)    07/ 2008-- treated w/ coumadin   History of syncope    recurrent --  workup done per epic -- dx orthostatic hypotension   Hyperlipidemia    Hypertension    IBS (irritable bowel syndrome)    Neuromuscular disorder (HCC)    carpal tunnel   Pelvic pain in female    Post-surgical hypothyroidism    1991 left thyroidectomy   Pre-diabetes    Proteinuria    Seasonal  and perennial allergic rhinitis    Wears contact lenses    Wears glasses     Past Surgical History:  Procedure Laterality Date   69 HOUR [place] STUDY N/A 11/16/2016   Procedure: 24 HOUR PH STUDY;  Surgeon: Doran Stabler, MD;  Location: [facility];  Service: Gastroenterology;  Laterality: N/A;   ABDOMINAL HYSTERECTOMY  1998   COLONOSCOPY     COLONOSCOPY WITH PROPOFOL N/A 08/05/2021   Procedure: COLONOSCOPY WITH PROPOFOL;  Surgeon: Sharyn Creamer, MD;  Location: [facility];  Service: Gastroenterology;  Laterality: N/A;   CYSTOSCOPY N/A 04/28/2016   Procedure: CYSTOSCOPY;  Surgeon: Arvella Nigh, MD;  Location: [facility];  Service:  Gynecology;  Laterality: N/A;   ESOPHAGEAL MANOMETRY N/A 11/16/2016   Procedure: ESOPHAGEAL MANOMETRY (EM);  Surgeon: Doran Stabler, MD;  Location: [facility];  Service: Gastroenterology;  Laterality: N/A;   ESOPHAGOGASTRODUODENOSCOPY     LAPAROSCOPIC CHOLECYSTECTOMY  1997   LAPAROSCOPY N/A 03/26/2016   Procedure: LAPAROSCOPY DIAGNOSTIC, LYSIS OF ADHESIONS;  Surgeon: Arvella Nigh, MD;  Location: [place];  Service: Gynecology;  Laterality: N/A;   LAPAROTOMY N/A 04/28/2016   Procedure: EXPLORATORY LAPAROTOMY;  Surgeon: Arvella Nigh, MD;  Location: [facility];  Service: Gynecology;  Laterality: N/A;   RENAL BIOPSY     SALPINGOOPHORECTOMY Bilateral 04/28/2016   Procedure: Bilateral OOPHORECTOMY;  Surgeon: Arvella Nigh, MD;  Location: [facility];  Service: Gynecology;  Laterality: Bilateral;   THYROIDECTOMY Left 1991   TRANSTHORACIC ECHOCARDIOGRAM  04/27/2012   normal LV, ef 60-65%/  trivial MR   TUBAL LIGATION  1992    Current Outpatient Medications  Medication Sig Dispense Refill   albuterol (VENTOLIN HFA) 108 (90 Base) MCG/ACT inhaler Inhale 2 puffs into the lungs every 6 (six) hours as needed. 6.7 g 1   ALPRAZolam (XANAX) 0.25 MG tablet Take 1 tablet (0.25 mg total) by mouth 2 (two) times daily as needed for anxiety. (Patient not taking: Reported on 10/15/2022) 60 tablet 0   atorvastatin (LIPITOR) 40 MG tablet TAKE 1 TABLET(40 MG) BY MOUTH DAILY 90 tablet 1   benazepril-hydrochlorthiazide (LOTENSIN HCT) 20-25 MG tablet TAKE 1 TABLET BY MOUTH DAILY 90 tablet 1   cetirizine (ZYRTEC) 10 MG tablet Take 10 mg by mouth daily.     docusate sodium (COLACE) 100 MG capsule Take 100 mg by mouth daily.     esomeprazole (NEXIUM) 40 MG capsule Take 1 capsule (40 mg total) by mouth daily at 12 noon. 90 capsule 1   glucose blood (ONETOUCH ULTRA) test strip Use to check glucose 1 time daily 100 each 12   Lancet Devices (ONETOUCH DELICA PLUS LANCING) MISC 1 each by Does not apply route daily.  100 each 12   levothyroxine (SYNTHROID) 100 MCG tablet TAKE 1 TABLET(100 MCG) BY MOUTH DAILY BEFORE BREAKFAST 90 tablet 3   Multiple Vitamins-Minerals (MULTIVITAMIN WITH MINERALS) tablet Take 1 tablet by mouth daily.     ondansetron (ZOFRAN) 8 MG tablet Take 1 tablet (8 mg total) by mouth every 8 (eight) hours as needed for nausea or vomiting. 30 tablet 0   Vitamin D, Ergocalciferol, (DRISDOL) 1.25 MG (50000 UNIT) CAPS capsule Take 1 capsule (50,000 Units total) by mouth every 7 (seven) days. 12 capsule 0   No current facility-administered medications for this visit.    Allergies as of 11/18/2022 - Review Complete 11/03/2022  Allergen Reaction Noted   Aspirin Hives    Flagyl [metronidazole] Nausea And Vomiting 06/20/2013   Food Other (  See Comments) 10/02/2011   Tramadol Other (See Comments) 10/02/2011   Dilaudid [hydromorphone] Itching 07/01/2014    Family History  Problem Relation Age of Onset   Diabetes Mother        Pre-diabetic   Hypertension Mother    Hypertension Father    Coronary artery disease Father    Diabetes Father    Stroke Father    Prostate cancer Father    Breast cancer Maternal Aunt    Thyroid disease Maternal Aunt    Colon cancer Neg Hx    Colon polyps Neg Hx    Kidney disease Neg Hx    Esophageal cancer Neg Hx    Gallbladder disease Neg Hx    Stomach cancer Neg Hx    Rectal cancer Neg Hx     Social History   Socioeconomic History   Marital status: Married    Spouse name: Not on file   Number of children: 2   Years of education: Not on file   Highest education level: 12th grade  Occupational History   Occupation: NURSING ASSISTANT    Employer: [employer]  Tobacco Use   Smoking status: Never   Smokeless tobacco: Never  Vaping Use   Vaping Use: Never used  Substance and Sexual Activity   Alcohol use: No    Alcohol/week: 0.0 standard drinks of alcohol   Drug use: No   Sexual activity: Yes    Birth control/protection: Surgical   Other Topics Concern   Not on file  Social History Narrative   Not on file   Social Determinants of Health   Financial Resource Strain: Low Risk  (08/14/2022)   Overall Financial Resource Strain (CARDIA)    Difficulty of Paying Living Expenses: Not hard at all  Food Insecurity: No Food Insecurity (08/14/2022)   Hunger Vital Sign    Worried About Running Out of Food in the Last Year: Never true    Ran Out of Food in the Last Year: Never true  Transportation Needs: No Transportation Needs (08/14/2022)   PRAPARE - Hydrologist (Medical): No    Lack of Transportation (Non-Medical): No  Physical Activity: Unknown (08/14/2022)   Exercise Vital Sign    Days of Exercise per Week: 0 days    Minutes of Exercise per Session: Not on file  Stress: Stress Concern Present (08/14/2022)   [place]    Feeling of Stress : To some extent  Social Connections: Moderately Integrated (08/14/2022)   Social Connection and Isolation Panel [NHANES]    Frequency of Communication with Friends and Family: More than three times a week    Frequency of Social Gatherings with Friends and Family: Once a week    Attends Religious Services: More than 4 times per year    Active Member of [employer] or Organizations: No    Attends Music therapist: Not on file    Marital Status: Married  Human resources officer Violence: Not on file    Review of Systems:    Constitutional: No weight loss, fever or chills Cardiovascular: No chest pain  Respiratory: No SOB  Gastrointestinal: See HPI and otherwise negative   Physical Exam:  Vital signs: BP 114/70 (BP Location: Left Arm, Patient Position: Sitting, Cuff Size: Large)   Pulse 80   Ht 5\' 5"  (1.651 m)   Wt 196 lb 4 oz (89 kg)   BMI 32.66 kg/m    Constitutional:   Pleasant  overweight AA female appears to be in NAD, Well developed, Well nourished, alert and  cooperative Respiratory: Respirations even and unlabored. Lungs clear to auscultation bilaterally.   No wheezes, crackles, or rhonchi.  Cardiovascular: Normal S1, S2. No MRG. Regular rate and rhythm. No peripheral edema, cyanosis or pallor.  Gastrointestinal:  Soft, nondistended, nontender. No rebound or guarding. Normal bowel sounds. No appreciable masses or hepatomegaly. Rectal:  Not performed.  Psychiatric: Oriented to person, place and time. Demonstrates good judgement and reason without abnormal affect or behaviors.  RELEVANT LABS AND IMAGING: CBC    Component Value Date/Time   WBC 6.6 06/15/2022 1016   RBC 4.35 06/15/2022 1016   HGB 12.1 06/15/2022 1016   HCT 36.9 06/15/2022 1016   PLT 270.0 06/15/2022 1016   MCV 84.7 06/15/2022 1016   MCH 27.6 02/13/2022 1926   MCHC 32.9 06/15/2022 1016   RDW 14.6 06/15/2022 1016   LYMPHSABS 2.5 06/15/2022 1016   MONOABS 0.6 06/15/2022 1016   EOSABS 0.3 06/15/2022 1016   BASOSABS 0.0 06/15/2022 1016    CMP     Component Value Date/Time   NA 139 06/15/2022 1016   K 4.1 06/15/2022 1016   CL 104 06/15/2022 1016   CO2 28 06/15/2022 1016   GLUCOSE 91 06/15/2022 1016   BUN 27 (H) 06/15/2022 1016   CREATININE 1.20 06/15/2022 1016   CALCIUM 11.0 (H) 06/15/2022 1016   PROT 8.1 06/15/2022 1016   ALBUMIN 4.3 06/15/2022 1016   AST 18 06/15/2022 1016   ALT 16 06/15/2022 1016   ALKPHOS 95 06/15/2022 1016   BILITOT 0.4 06/15/2022 1016   GFRNONAA 47 (L) 02/13/2022 1926   GFRAA 49 (L) 08/30/2019 1243    Assessment: 1.  IBS: Up-to-date on colonoscopy with last in December 2022 normal, previously diagnosed and treated with IBgard, patient has just been taking 1 tab once a day which has been helping until the past month, does endorse increase in stress/anxiety; most likely IBS flare  Plan: 1.  At first would recommend the patient increase her IBgard to 1 tab twice daily.  If this does not help over the next 2 to 3 days then increase to 2 tabs  twice daily.  If that does not help over the next couple of weeks she will call and let me know. 2.  If patient calls and still having trouble would recommend Dicyclomine 10 mg 4 times daily, 20-30 minutes before meals and at bedtime.  #120 with 3 refills. 3.  Reviewed IBS with the patient. 4.  Could also consider Buspirone in the future.  I did discuss this with her.  She is not eager to start back on a "antianxiety/depression med".  Tried to explain how this is slightly different and its use, but she would like to wait on this. 5.  Patient to follow in clinic with me in 2 months or sooner if necessary.  Ellouise Newer, PA-C [facility] 11/18/2022, 2:57 PM  Cc: Isaac Bliss, [place]*

## 2022-11-19 NOTE — Progress Notes (Signed)
____________________________________________________________  Attending physician addendum:  Thank you for sending this case to me. I have reviewed the entire note and agree with the plan.  This sounds most likely to be a flare of her functional bowel disorder.  If she is not significantly improved at follow-up, recommend breath testing for SIBO.  Wilfrid Lund, MD  ____________________________________________________________

## 2022-11-27 NOTE — Therapy (Signed)
OUTPATIENT OCCUPATIONAL THERAPY TREATMENT & DISCHARGE NOTE  Patient Name: Rhonda [name] MRN: [id number] DOB:June 11, 1971, 52 y.o., female Today's Date: 12/01/2022  PCP: Philip Aspen, Minerva Ends, MD REFERRING PROVIDER: Rodolph Bong, MD   Progress Note Reporting Period  10/15/22 to 12/01/22.   See note below for Objective Data and Assessment of Progress/Goals.      END OF SESSION:  OT End of Session - 12/01/22 1103     Visit Number 5    Number of Visits 10    Date for OT Re-Evaluation 11/27/22    Authorization Type [employer]    OT Start Time 1103    OT Stop Time 1133    OT Time Calculation (min) 30 min    Activity Tolerance No increased pain;Patient tolerated treatment well    Behavior During Therapy WFL for tasks assessed/performed             Past Medical History:  Diagnosis Date   Anxiety    C1q nephropathy    Cancer    history of thyroid cancer   Chronic constipation    CKD (chronic kidney disease), stage II nephrologist-  dr Vern Claude [facility])   secondary to C1q nephropathy/  hypertension   Family history of adverse reaction to anesthesia    father hard to put down for anesthesia   GERD (gastroesophageal reflux disease)    H/O radioactive iodine thyroid ablation    1992  right throid   History of exercise intolerance    01-052011  ETT--  normal w/ no ischemia per cardiologist (dr Shirlee Latch) epic note   History of pulmonary embolus (PE)    07/ 2008-- treated w/ coumadin   History of syncope    recurrent --  workup done per epic -- dx orthostatic hypotension   Hyperlipidemia    Hypertension    IBS (irritable bowel syndrome)    Neuromuscular disorder    carpal tunnel   Pelvic pain in female    Post-surgical hypothyroidism    1991 left thyroidectomy   Pre-diabetes    Proteinuria    Seasonal and perennial allergic rhinitis    Wears contact lenses    Wears glasses    Past Surgical History:  Procedure Laterality Date   30  HOUR PH STUDY N/A 11/16/2016   Procedure: 24 HOUR PH STUDY;  Surgeon: Sherrilyn Rist, MD;  Location: [facility];  Service: Gastroenterology;  Laterality: N/A;   ABDOMINAL HYSTERECTOMY  1998   COLONOSCOPY     COLONOSCOPY WITH PROPOFOL N/A 08/05/2021   Procedure: COLONOSCOPY WITH PROPOFOL;  Surgeon: Imogene Burn, MD;  Location: [facility];  Service: Gastroenterology;  Laterality: N/A;   CYSTOSCOPY N/A 04/28/2016   Procedure: CYSTOSCOPY;  Surgeon: Richardean Chimera, MD;  Location: [facility];  Service: Gynecology;  Laterality: N/A;   ESOPHAGEAL MANOMETRY N/A 11/16/2016   Procedure: ESOPHAGEAL MANOMETRY (EM);  Surgeon: Sherrilyn Rist, MD;  Location: [facility];  Service: Gastroenterology;  Laterality: N/A;   ESOPHAGOGASTRODUODENOSCOPY     LAPAROSCOPIC CHOLECYSTECTOMY  1997   LAPAROSCOPY N/A 03/26/2016   Procedure: LAPAROSCOPY DIAGNOSTIC, LYSIS OF ADHESIONS;  Surgeon: Richardean Chimera, MD;  Location: [facility];  Service: Gynecology;  Laterality: N/A;   LAPAROTOMY N/A 04/28/2016   Procedure: EXPLORATORY LAPAROTOMY;  Surgeon: Richardean Chimera, MD;  Location: [facility];  Service: Gynecology;  Laterality: N/A;   RENAL BIOPSY     SALPINGOOPHORECTOMY Bilateral 04/28/2016   Procedure: Bilateral OOPHORECTOMY;  Surgeon: Richardean Chimera, MD;  Location: [facility];  Service: Gynecology;  Laterality: Bilateral;   THYROIDECTOMY Left 1991   TRANSTHORACIC ECHOCARDIOGRAM  04/27/2012   normal LV, ef 60-65%/  trivial MR   TUBAL LIGATION  1992   Patient Active Problem List   Diagnosis Date Noted   Hypomagnesemia 10/06/2021   Hypercalcemia 10/06/2021   Colon cancer screening    Bilateral carpal tunnel syndrome 01/07/2021   Flexor carpi radialis tendinitis 01/07/2021   Vitamin D deficiency 05/19/2019   IGT (impaired glucose tolerance) 05/19/2019   Exposure to medical diagnostic radiation 02/09/2017   S/P bilateral salpingo-oophorectomy 04/28/2016   Pelvic adhesions 03/26/2016    Class: Present on Admission    History of pulmonary embolism 08/01/2014   CKD (chronic kidney disease) stage 3, GFR 30-59 ml/min (HCC) 02/27/2014   Hypothyroidism 04/28/2012   Hemorrhage of rectum and anus 02/09/2011   Constipation 02/09/2011   Dyslipidemia 08/26/2009   Backache 02/14/2008   Allergic rhinitis 11/15/2007   GERD 06/07/2007   Anxiety state 04/27/2007   Essential hypertension 04/27/2007   Disorder of kidney and ureter 03/17/2007   EMBOLISM/THROMBOSIS, DEEP VSL PRXML LWR EXTRM 03/16/2007    ONSET DATE: 1 year, chronic, insidious   REFERRING DIAG: M79.641,M79.642 (ICD-10-CM) - Bilateral hand pain   THERAPY DIAG:  Pain in joint of right hand  Pain in right wrist  Pain in left wrist  Stiffness of right wrist, not elsewhere classified  Other lack of coordination  Muscle weakness (generalized)  Stiffness of left hand, not elsewhere classified  Rationale for Evaluation and Treatment: Rehabilitation  PERTINENT HISTORY: Per MD notes: " cont'd bilat wrist pain, R>L. Pt was last seen by Dr. Denyse Amass on 09/26/21 and was given a R dorsal wrist steroid injection. Pt was last seen for bilat CTS on 01/07/21. Today, pt reports R dorsal wrist pain has returned/worsened over the last month. Pain is now coming up into her R hand and R thumb...52 y.o. 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  Plan on steroid injection into the right dorsal wrist radiocarpal joint and right first CMC injection. Additionally she has evidence of left carpal tunnel syndrome to explain her paresthesias.  Plan for carpal tunnel wrist brace at night."   PRECAUTIONS: None; WEIGHT BEARING RESTRICTIONS: No  SUBJECTIVE:   SUBJECTIVE STATEMENT: She returns after ~3 weeks absence due to catching Covid 19. Despite that, she states trying to do HEP while she was out, her Rt thumb is the majority of pain today, but everything is much improved.   PAIN:  Are you having pain?  Yes, mainly in Rt thumb and dorsum of wrist also Lt dorsum  of wrist Rating: 4/10 at rest now in Rt thumb  PATIENT GOALS: To have less pain and better motion and ability with both hands and wrists.   OBJECTIVE: (All objective assessments below are from initial evaluation on: 10/15/22 unless otherwise specified.)   HAND DOMINANCE: Right   ADLs: Overall ADLs: States decreased ability to grab, hold household objects, pain and difficulty to open containers, perform FMS tasks (manipulate fasteners on clothing)  FUNCTIONAL OUTCOME MEASURES: 12/01/22: Quick DASH 9% impairment today  (Higher % Score  =  More Impairment)    Eval: Quck DASH 23% impairment today  (Higher % Score  =  More Impairment)    UPPER EXTREMITY ROM     Shoulder to Wrist AROM Right eval Left eval Rt / Lt 12/01/22  Forearm supination 72 71   Forearm pronation  80 75   Wrist flexion 61 62  56 Rt / 69* Lt   Wrist extension 66 70 68* Rt /  75* Lt  Functional dart thrower's motion (F-DTM) in ulnar flexion 28 21 37Lt  F-DTM in radial extension  69 40  80Lt  (Blank rows = not tested)   Hand AROM Right eval Left eval  Full Fist Ability (or Gap to Distal Palmar Crease) yes yes  Thumb Opposition  (Kapandji Scale)  10 9  Thumb MCP (0-60) 61 54  Thumb IP (0-80) 52 41  Thumb Radial Abduction Span 5.8cm  6.2cm  (Blank rows = not tested)   UPPER EXTREMITY MMT:     MMT Right 10/15/22 Left 10/15/22 Rt / Lt 12/01/22  Forearm supination     Forearm pronation     Wrist flexion 5/5 4-/5 Pain in dorsum of wrist 5/5 both  Wrist extension 4/5 slight pain in dorsum of wrist 5/5 no pain 5/5 both  Wrist ulnar deviation 4+/5 4/5 tender in dorsum 4+/5 Rt / 5/5 Lt  Wrist radial deviation 5/5 4+/5 5/5 both   (Blank rows = not tested)  HAND FUNCTION: 12/01/22: Rt 56.5 non-painfully; Lt  59#  Eval: Observed weakness in affected hand.  Grip strength Right: 55 lbs pain, Left: 65 lbs   COORDINATION: 12/01/22: 9HPT Rt:  19sec  WNL  Eval: Observed coordination impairments with affected  hand. 9 Hole Peg Test Right: 21sec (dropped 1 peg), Left: 18.4 sec  OBSERVATIONS:   12/01/22: Rt thumb only very mildly TTP now at [facility] J but not elsewhere, and Lt 1st dorsal compartment no longer tender.    Eval: TTP at Rt CMC J (not Lt), and Lt extensor retinaculum/dorsal wrist. Feels tight in Lt T1 dorsally and also in Rt extensor retinaculum  Lt wrist: neg TFCC shear, neg piano key at DRUJ, tight DTM (tighter at mid-carpal joint), also shows De Quervain's symptoms with provocation tests Rt wrist: tighter in radiocarpal joint and overt thumb CMC J issues   TODAY'S TREATMENT:  12/01/22: She starts with review of self-care and functional ability at home and discussion of her goals.  She states less than half of the problems she had when she started therapy.  She also states understanding using Kinesiotape to support her thumb and is shown prefabricated tight fitting thumb "glove" to help support her thumb as well if needed.  She performs AROM as well as coordination activity and gripping for updated measures-performing better on all now.  And nonpainfully.  She reviews her home exercise program including bilateral wrist and thumb stretches and strengthening as tolerated now.  She states understanding all and that she could do this on her own now.  PATIENT EDUCATION: Education details: See tx section above for details  Person educated: Patient Education method: Verbal Instruction, Teach back, Handouts  Education comprehension: States and demonstrates understanding, Additional Education required   HOME EXERCISE PROGRAM: Access Code: [email or web address] URL: [email or web address]   GOALS: Goals reviewed with patient? Yes   SHORT TERM GOALS: (STG required if POC>30 days) Target Date: 10/30/22  Pt will obtain protective, custom orthotic. Goal status: TBD/PRN  2.  Pt will demo/state understanding of initial HEP to improve pain levels and prerequisite motion. Goal status: 10/29/22:  MET   LONG TERM GOALS: Target Date: 11/27/22  Pt will improve functional ability by decreased impairment per Quick DASH assessment from 23% to 10% or better, for better quality of life. Goal status: 12/01/22: MET 9% now  2.  Pt will improve grip strength in Rt hand from painful  55lbs to at least 65lbs for functional use at home and in IADLs. Goal status: 12/01/22: Not Met but now not painful and slightly improved   3.  Pt will improve A/ROM in b/l wrist flexion from ~60* to at least 70*, to have functional motion for tasks like reach and grasp.  Goal status: 12/01/22: partially met-  met in Lt wrist, but Rt wrist a bit tight today   4.  Pt will improve strength in Lt wrist flex from painful 4-/5 MMT to at least 4+/5 MMT to have increased functional ability to carry out selfcare and higher-level homecare tasks with no difficulty. Goal status: 12/01/22: Met 5/5 now  5.  Pt will improve coordination skills in Rt hand, as seen by better score on 9HPT from 21sec to 19sec to have increased functional ability to carry out fine motor tasks (fasteners, etc.) and more complex, coordinated IADLs (meal prep, sports, etc.).  Goal status: 12/01/22: 19 sec MET  6.  Pt will decrease pain at worst from 7-8/10 to 2-3/10 or better to have better sleep and occupational participation in daily roles. Goal status: 12/01/22:  Not Met, improved now 5-6/10 after work    ASSESSMENT:  CLINICAL IMPRESSION: 12/01/22: No sign of Lt DeQuervain's now. She has excellent strength of the left arm and grip and no pain, right thumb still has mild pains at times but overall much improved and grip is no longer painful.  She states having an excellent home exercise program and understanding how to use prefabricated compression gloves or Kinesiotape to help support her thumb during work.  She is happy to discharge today most goals are met.   PLAN:  OT FREQUENCY: 1 additional visit to cover today   OT DURATION: 1 week  11/27/22 -  12/01/22 to cover today's last visit.   PLANNED INTERVENTIONS: self care/ADL training, therapeutic exercise, therapeutic activity, neuromuscular re-education, manual therapy, passive range of motion, splinting, ultrasound, compression bandaging, moist heat, cryotherapy, contrast bath, patient/family education, coping strategies training, and Dry needling  CONSULTED AND AGREED WITH PLAN OF CARE: Patient  PLAN FOR NEXT SESSION:  D/C now   Fannie Knee, OTR/L, CHT 12/01/2022, 11:42 AM   OCCUPATIONAL THERAPY DISCHARGE SUMMARY  Visits from Start of Care: 5  Current functional level related to goals / functional outcomes: Pt has met most goals to satisfactory levels and is pleased with outcomes.   Remaining deficits: Pt has no more significant functional deficits and pain has been minimized.   Education / Equipment: Pt has all needed materials and education. Pt understands how to continue on with self-management. See tx notes for more details.   Patient agrees to discharge due to max benefits received from outpatient occupational therapy / hand therapy at this time.   Fannie Knee, OTR/L, CHT 12/01/22

## 2022-12-01 ENCOUNTER — Ambulatory Visit (INDEPENDENT_AMBULATORY_CARE_PROVIDER_SITE_OTHER): Payer: BC Managed Care – PPO | Admitting: Rehabilitative and Restorative Service Providers"

## 2022-12-01 ENCOUNTER — Encounter: Payer: Self-pay | Admitting: Rehabilitative and Restorative Service Providers"

## 2022-12-01 DIAGNOSIS — M25541 Pain in joints of right hand: Secondary | ICD-10-CM | POA: Diagnosis not present

## 2022-12-01 DIAGNOSIS — M25631 Stiffness of right wrist, not elsewhere classified: Secondary | ICD-10-CM | POA: Diagnosis not present

## 2022-12-01 DIAGNOSIS — M6281 Muscle weakness (generalized): Secondary | ICD-10-CM

## 2022-12-01 DIAGNOSIS — M25531 Pain in right wrist: Secondary | ICD-10-CM

## 2022-12-01 DIAGNOSIS — M25532 Pain in left wrist: Secondary | ICD-10-CM | POA: Diagnosis not present

## 2022-12-01 DIAGNOSIS — M25642 Stiffness of left hand, not elsewhere classified: Secondary | ICD-10-CM

## 2022-12-01 DIAGNOSIS — R278 Other lack of coordination: Secondary | ICD-10-CM

## 2022-12-10 ENCOUNTER — Telehealth: Payer: Self-pay | Admitting: Physician Assistant

## 2022-12-10 NOTE — Telephone Encounter (Signed)
Lm on vm for patient to return call.    Per 11/18/22 office visit:  Assessment: 1.  IBS: Up-to-date on colonoscopy with last in December 2022 normal, previously diagnosed and treated with IBgard, patient has just been taking 1 tab once a day which has been helping until the past month, does endorse increase in stress/anxiety; most likely IBS flare   Plan: 1.  At first would recommend the patient increase her IBgard to 1 tab twice daily.  If this does not help over the next 2 to 3 days then increase to 2 tabs twice daily.  If that does not help over the next couple of weeks she will call and let me know. 2.  If patient calls and still having trouble would recommend Dicyclomine 10 mg 4 times daily, 20-30 minutes before meals and at bedtime.  #120 with 3 refills.

## 2022-12-10 NOTE — Telephone Encounter (Signed)
Inbound call from patient states she is still experiencing abdominal pain and is requesting to speak with a nurse to discuss POC Please advise.

## 2022-12-14 ENCOUNTER — Other Ambulatory Visit: Payer: Self-pay | Admitting: Internal Medicine

## 2022-12-15 ENCOUNTER — Other Ambulatory Visit: Payer: Self-pay | Admitting: Internal Medicine

## 2022-12-16 NOTE — Telephone Encounter (Signed)
This has been addressed. See 12/11/22 patient message.

## 2023-01-07 ENCOUNTER — Other Ambulatory Visit: Payer: Self-pay | Admitting: Internal Medicine

## 2023-01-11 ENCOUNTER — Emergency Department (HOSPITAL_COMMUNITY): Payer: Self-pay

## 2023-01-11 ENCOUNTER — Other Ambulatory Visit: Payer: Self-pay

## 2023-01-11 ENCOUNTER — Emergency Department (HOSPITAL_COMMUNITY)
Admission: EM | Admit: 2023-01-11 | Discharge: 2023-01-11 | Disposition: A | Discharge status: Home / Self Care | Payer: Self-pay | Attending: Emergency Medicine | Admitting: Emergency Medicine

## 2023-01-11 ENCOUNTER — Encounter (HOSPITAL_COMMUNITY): Payer: Self-pay

## 2023-01-11 DIAGNOSIS — I129 Hypertensive chronic kidney disease with stage 1 through stage 4 chronic kidney disease, or unspecified chronic kidney disease: Secondary | ICD-10-CM | POA: Insufficient documentation

## 2023-01-11 DIAGNOSIS — N189 Chronic kidney disease, unspecified: Secondary | ICD-10-CM | POA: Insufficient documentation

## 2023-01-11 DIAGNOSIS — I2694 Multiple subsegmental pulmonary emboli without acute cor pulmonale: Secondary | ICD-10-CM | POA: Insufficient documentation

## 2023-01-11 DIAGNOSIS — R0789 Other chest pain: Secondary | ICD-10-CM

## 2023-01-11 LAB — BASIC METABOLIC PANEL
Anion gap: 9 (ref 5–15)
BUN: 29 mg/dL — ABNORMAL HIGH (ref 6–20)
CO2: 24 mmol/L (ref 22–32)
Calcium: 10.1 mg/dL (ref 8.9–10.3)
Chloride: 106 mmol/L (ref 98–111)
Creatinine, Ser: 1.38 mg/dL — ABNORMAL HIGH (ref 0.44–1.00)
GFR, Estimated: 46 mL/min — ABNORMAL LOW (ref >60)
Glucose, Bld: 117 mg/dL — ABNORMAL HIGH (ref 70–99)
Potassium: 3.6 mmol/L (ref 3.5–5.1)
Sodium: 139 mmol/L (ref 135–145)

## 2023-01-11 LAB — D-DIMER, QUANTITATIVE: D-Dimer, Quant: <0.27 ug/mL-FEU (ref 0.00–0.50)

## 2023-01-11 LAB — CBC
HCT: 37.3 % (ref 36.0–46.0)
Hemoglobin: 12.2 g/dL (ref 12.0–15.0)
MCH: 28.2 pg (ref 26.0–34.0)
MCHC: 32.7 g/dL (ref 30.0–36.0)
MCV: 86.3 fL (ref 80.0–100.0)
Platelets: 238 10*3/uL (ref 150–400)
RBC: 4.32 MIL/uL (ref 3.87–5.11)
RDW: 13.2 % (ref 11.5–15.5)
WBC: 7.3 10*3/uL (ref 4.0–10.5)
nRBC: 0 % (ref 0.0–0.2)

## 2023-01-11 LAB — TROPONIN I (HIGH SENSITIVITY)
Troponin I (High Sensitivity): 4 ng/L (ref <18)
Troponin I (High Sensitivity): 3 ng/L (ref <18)

## 2023-01-11 MED ORDER — IOHEXOL 350 MG/ML SOLN
75.0000 mL | Freq: Once | INTRAVENOUS | Status: AC | PRN
  Administered 2023-01-11: 75 mL via INTRAVENOUS

## 2023-01-11 MED ORDER — APIXABAN 5 MG PO TABS
10.0000 mg | ORAL_TABLET | Freq: Once | ORAL | Status: AC
  Administered 2023-01-11: 10 mg via ORAL
  Filled 2023-01-11: qty 2

## 2023-01-11 MED ORDER — APIXABAN (ELIQUIS) VTE STARTER PACK (10MG AND 5MG): ORAL_TABLET | ORAL | 0 refills | Status: DC

## 2023-01-11 MED ORDER — MORPHINE SULFATE (PF) 2 MG/ML IV SOLN
2.0000 mg | Freq: Once | INTRAVENOUS | Status: AC
  Administered 2023-01-11: 2 mg via INTRAVENOUS
  Filled 2023-01-11: qty 1

## 2023-01-11 NOTE — Discharge Instructions (Signed)
As we discussed, you are low risk for heart disease but nonzero risk.  You should follow-up with your cardiologist or PCP for a stress test.  We did discover you have new blood clot in your lung called pulmonary emboli and you are being started on a blood thinner called Eliquis.  Eliquis increases your risk of bleeding.  If you fall or hit your head while taking this medication you should be evaluated in the emergency department.  This may explain your chest pain but you should still follow-up with a cardiologist for a stress test.  Return to the ED sooner with exertional chest pain, pain associate with shortness of breath, nausea, vomiting, sweating or other concerns.

## 2023-01-11 NOTE — ED Triage Notes (Signed)
Per patient  Chest pain Intermittent pain Started yesterday Left arm pain/tingling Intermittent pain Back pain Constant Mid back Headache Started yesterday

## 2023-01-11 NOTE — ED Provider Notes (Signed)
EMERGENCY DEPARTMENT AT [facility] Provider Note   CSN: [id number] Arrival date & time: 01/11/23  1547     History  No chief complaint on file.   Rhonda [name] is a 52 y.o. female.  Patient with a history of hypertension, GERD, previous PE and longer anticoagulation, CKD, hyperlipidemia presenting with intermittent chest pain for the past 2 days.  She reports the pain is dull in the background but becomes severe at times.  The pain never goes away completely.  The pain is in the center of her chest radiates to her left arm, shoulder and upper back.  Associate with numbness to her left arm as well as shortness of breath, nausea and feeling hot but no diaphoresis.  She has sharp stabbing pain for 2 to 3 minutes at a time and then backs off to a low intensity pain.  Never goes away completely.  Nothing seems to make it better or worse.  No abdominal pain, vomiting, cough or fever.  No pain with urination or blood in the urine.  She denies any previous cardiac history.  She has never had a stress test.  She follows with Dr. Cristal Deer of cardiology.  States she had a blood clot remotely but is no longer on Coumadin.  The history is provided by the patient.       Home Medications Prior to Admission medications   Medication Sig Start Date End Date Taking? Authorizing Provider  albuterol (VENTOLIN HFA) 108 (90 Base) MCG/ACT inhaler Inhale 2 puffs into the lungs every 6 (six) hours as needed. 06/15/22   Philip Aspen, Limmie Patricia, MD  ALPRAZolam Prudy Feeler) 0.25 MG tablet Take 1 tablet (0.25 mg total) by mouth 2 (two) times daily as needed for anxiety. 12/19/19   Philip Aspen, Limmie Patricia, MD  atorvastatin (LIPITOR) 40 MG tablet TAKE 1 TABLET(40 MG) BY MOUTH DAILY 08/18/22   Philip Aspen, Limmie Patricia, MD  benazepril-hydrochlorthiazide (LOTENSIN HCT) 20-25 MG tablet TAKE 1 TABLET BY MOUTH DAILY 01/07/23   Philip Aspen, Limmie Patricia, MD  cetirizine (ZYRTEC) 10 MG tablet  Take 10 mg by mouth daily.    [provider]  docusate sodium (COLACE) 100 MG capsule Take 100 mg by mouth daily.    [provider]  esomeprazole (NEXIUM) 40 MG capsule TAKE ONE CAPSULE BY MOUTH EVERY DAY AT 12 NOON. 12/15/22   Philip Aspen, Limmie Patricia, MD  glucose blood [facility] ULTRA) test strip Use to check glucose 1 time daily 01/05/22   Philip Aspen, Limmie Patricia, MD  Lancet Devices [facility] PLUS LANCING) MISC 1 each by Does not apply route daily. 10/13/21   Philip Aspen, Limmie Patricia, MD  Lancets [facility] DELICA PLUS [place]) MISC USE AS DIRECTED ONCE DAILY 12/15/22   Philip Aspen, Limmie Patricia, MD  levothyroxine (SYNTHROID) 100 MCG tablet TAKE 1 TABLET(100 MCG) BY MOUTH DAILY BEFORE BREAKFAST 04/07/22   Reather Littler, MD  Multiple Vitamins-Minerals (MULTIVITAMIN WITH MINERALS) tablet Take 1 tablet by mouth daily.    [provider]  ondansetron (ZOFRAN) 8 MG tablet Take 1 tablet (8 mg total) by mouth every 8 (eight) hours as needed for nausea or vomiting. 05/22/19   Sherrilyn Rist, MD  Vitamin D, Ergocalciferol, (DRISDOL) 1.25 MG (50000 UNIT) CAPS capsule Take 1 capsule (50,000 Units total) by mouth every 7 (seven) days. 06/30/22   Philip Aspen, Limmie Patricia, MD      Allergies    Aspirin, Flagyl [metronidazole], Food, Tramadol, and Dilaudid [hydromorphone]  Review of Systems   Review of Systems  Constitutional:  Negative for activity change, appetite change and fever.  HENT:  Negative for congestion and rhinorrhea.   Respiratory:  Positive for chest tightness and shortness of breath.   Cardiovascular:  Positive for chest pain.  Gastrointestinal:  Positive for nausea. Negative for abdominal pain and vomiting.  Genitourinary:  Negative for dysuria and hematuria.  Musculoskeletal:  Positive for back pain. Negative for arthralgias and myalgias.  Skin:  Negative for rash.  Neurological:  Negative for dizziness, weakness and headaches.   all other  systems are negative except as noted in the HPI and PMH.    Physical Exam Updated Vital Signs BP 131/87 (BP Location: Left Arm)   Pulse 77   Temp 98.1 F (36.7 C) (Oral)   Resp 14   Ht 5\' 5"  (1.651 m)   Wt 88.5 kg   SpO2 97%   BMI 32.45 kg/m  Physical Exam Vitals and nursing note reviewed.  Constitutional:      General: She is not in acute distress.    Appearance: She is well-developed.  HENT:     Head: Normocephalic and atraumatic.     Mouth/Throat:     Pharynx: No oropharyngeal exudate.  Eyes:     Conjunctiva/sclera: Conjunctivae normal.     Pupils: Pupils are equal, round, and reactive to light.  Neck:     Comments: No meningismus. Cardiovascular:     Rate and Rhythm: Normal rate and regular rhythm.     Heart sounds: Normal heart sounds. No murmur heard. Pulmonary:     Effort: Pulmonary effort is normal. No respiratory distress.     Breath sounds: Normal breath sounds.  Chest:     Chest wall: No tenderness.  Abdominal:     Palpations: Abdomen is soft.     Tenderness: There is no abdominal tenderness. There is no guarding or rebound.  Musculoskeletal:        General: No tenderness. Normal range of motion.     Cervical back: Normal range of motion and neck supple.     Comments: Equal radial pulses and grip strengths  Skin:    General: Skin is warm.  Neurological:     Mental Status: She is alert and oriented to person, place, and time.     Cranial Nerves: No cranial nerve deficit.     Motor: No abnormal muscle tone.     Coordination: Coordination normal.     Comments:  5/5 strength throughout. CN 2-12 intact.Equal grip strength.   Psychiatric:        Behavior: Behavior normal.     ED Results / Procedures / Treatments   Labs (all labs ordered are listed, but only abnormal results are displayed) Labs Reviewed  BASIC METABOLIC PANEL - Abnormal; Notable for the following components:      Result Value   Glucose, Bld 117 (*)    BUN 29 (*)    Creatinine, Ser  1.38 (*)    GFR, Estimated 46 (*)    All other components within normal limits  CBC  D-DIMER, QUANTITATIVE  TROPONIN I (HIGH SENSITIVITY)  TROPONIN I (HIGH SENSITIVITY)    EKG EKG Interpretation  Date/Time:  Monday Jan 11 2023 15:54:56 EDT Ventricular Rate:  78 PR Interval:  162 QRS Duration: 74 QT Interval:  412 QTC Calculation: 470 R Axis:   62 Text Interpretation: Sinus rhythm Borderline T abnormalities, diffuse leads non specific T wave changes Confirmed by Glynn Octave [phone]) on  01/11/2023 4:19:01 PM  Radiology CT Angio Chest PE W and/or Wo Contrast  Result Date: 01/11/2023 CLINICAL DATA:  Chest and back pain beginning yesterday. High probability for pulmonary embolism. EXAM: CT ANGIOGRAPHY CHEST WITH CONTRAST TECHNIQUE: Multidetector CT imaging of the chest was performed using the standard protocol during bolus administration of intravenous contrast. Multiplanar CT image reconstructions and MIPs were obtained to evaluate the vascular anatomy. RADIATION DOSE REDUCTION: This exam was performed according to the departmental dose-optimization program which includes automated exposure control, adjustment of the mA and/or kV according to patient size and/or use of iterative reconstruction technique. CONTRAST:  75mL OMNIPAQUE IOHEXOL 350 MG/ML SOLN COMPARISON:  08/30/2019 FINDINGS: Cardiovascular: Nonocclusive filling defects are seen in a few peripheral subsegmental pulmonary artery branches in the posterior lower lobes bilaterally (e.g. Image 243/10 and 222/10), suspicious for tiny peripheral pulmonary emboli. No other sites of pulmonary embolism identified. No evidence of thoracic aortic dissection or aneurysm. Mediastinum/Nodes: No masses or pathologically enlarged lymph nodes identified. Lungs/Pleura: No pulmonary mass, infiltrate, or effusion. Upper abdomen: No acute findings. Musculoskeletal: No suspicious bone lesions identified. Review of the MIP images confirms the above findings.  IMPRESSION: Several nonocclusive filling defects in subsegmental pulmonary artery branches in the posterior lower lobes bilaterally, highly suspicious for tiny peripheral pulmonary emboli. Critical Value/emergent results were called by telephone at the time of interpretation on 01/11/2023 at 5:35 pm to provider [facility] , who verbally acknowledged these results. Electronically Signed   By: Danae Orleans M.D.   On: 01/11/2023 17:35   DG Chest 2 View  Result Date: 01/11/2023 CLINICAL DATA:  Chest pain. EXAM: CHEST - 2 VIEW COMPARISON:  Radiograph 10/30/2019. CT 08/30/2019 FINDINGS: Heart size upper normal.The cardiomediastinal contours are normal. The lungs are clear. Pulmonary vasculature is normal. No consolidation, pleural effusion, or pneumothorax. Surgical clips at the left thoracic inlet. Small left cervical rib. No acute osseous abnormalities are seen. IMPRESSION: No acute chest findings. Electronically Signed   By: Narda Rutherford M.D.   On: 01/11/2023 16:37    Procedures Procedures    Medications Ordered in ED Medications  morphine (PF) 2 MG/ML injection 2 mg (has no administration in time range)    ED Course/ Medical Decision Making/ A&P                             Medical Decision Making Amount and/or Complexity of Data Reviewed Labs: ordered. Decision-making details documented in ED Course. Radiology: ordered and independent interpretation performed. Decision-making details documented in ED Course. ECG/medicine tests: ordered and independent interpretation performed. Decision-making details documented in ED Course.  Risk Prescription drug management.   2 days of intermittent chest pain radiating to upper back, left arm with shortness of breath and nausea.  EKG shows T wave inversions similar to previous.  Reports Aspirin allergy.  Patient was given morphine with good relief of her pain.  Troponin is negative.  D-dimer is negative.  However given her history, she is  not low risk for pulmonary embolism and CT scan will be obtained.  CT scan is positive for small peripheral bilateral PEs.  No evidence of right heart strain.  Results reviewed and interpreted by me.  Will initiate Eliquis.  Patient heart score is 4.  Uncertain whether these pulmonary emboli explain her chest pain.   Discussed with hospitalist observation admission for possible stress test and patient is agreeable.  Discussed with Dr. Jarvis Newcomer who feels patient can likely follow-up  as an outpatient if second troponin is negative.  She has no hypoxia or increased work of breathing.  Recommends discussion with cardiology for consideration of inpatient stress test.   Discussed with Tereso Newcomer, PA-C as well as Dr. Rennis Golden of cardiology.  They recommend outpatient follow-up for stress test given low heart score as well as new diagnosis of pulmonary emboli.  Recommend treating for pulmonary emboli with outpatient follow-up for consideration of stress test.  Troponin negative x 2.  Patient reassured.  Will follow-up with cardiology as an outpatient for stress test as well as PCP.  Continue Eliquis for small pulmonary emboli.  Return to the ED with exertional chest pain, shortness of breath, vomiting, sweating or any other concerns.  Discussed with patient need to follow-up with PCP before Eliquis prescription runs out.  Cardiology will call her this week to arrange for possible stress test.            Final Clinical Impression(s) / ED Diagnoses Final diagnoses:  Atypical chest pain  Multiple subsegmental pulmonary emboli without acute cor pulmonale [facility])    Rx / DC Orders ED Discharge Orders     None         Glynn Octave, MD 01/11/23 2055

## 2023-01-12 ENCOUNTER — Telehealth: Payer: Self-pay | Admitting: Internal Medicine

## 2023-01-12 NOTE — Telephone Encounter (Signed)
Pt diagnosed with blood clot in lungs, was prescribed Eliquis, ED told her to reach out to provider for a discount coupon/samples as the medication costs $711 (she has no insurance)

## 2023-01-13 NOTE — Telephone Encounter (Signed)
Samples provided in office while patient assistance application is pending. Submitted 01/13/23 to [employer]

## 2023-01-14 ENCOUNTER — Other Ambulatory Visit: Payer: Self-pay

## 2023-01-14 ENCOUNTER — Encounter (HOSPITAL_COMMUNITY): Payer: Self-pay | Admitting: *Deleted

## 2023-01-14 ENCOUNTER — Emergency Department (HOSPITAL_COMMUNITY): Payer: Self-pay

## 2023-01-14 ENCOUNTER — Emergency Department (HOSPITAL_COMMUNITY)
Admission: EM | Admit: 2023-01-14 | Discharge: 2023-01-14 | Disposition: A | Discharge status: Home / Self Care | Payer: Self-pay | Attending: Emergency Medicine | Admitting: Emergency Medicine

## 2023-01-14 ENCOUNTER — Emergency Department (HOSPITAL_COMMUNITY): Admission: EM | Admit: 2023-01-14 | Payer: Self-pay | Source: Home / Self Care

## 2023-01-14 DIAGNOSIS — Z79899 Other long term (current) drug therapy: Secondary | ICD-10-CM | POA: Insufficient documentation

## 2023-01-14 DIAGNOSIS — M79605 Pain in left leg: Secondary | ICD-10-CM | POA: Insufficient documentation

## 2023-01-14 DIAGNOSIS — R079 Chest pain, unspecified: Secondary | ICD-10-CM | POA: Insufficient documentation

## 2023-01-14 DIAGNOSIS — R0602 Shortness of breath: Secondary | ICD-10-CM | POA: Insufficient documentation

## 2023-01-14 DIAGNOSIS — Z7901 Long term (current) use of anticoagulants: Secondary | ICD-10-CM | POA: Insufficient documentation

## 2023-01-14 LAB — TROPONIN I (HIGH SENSITIVITY)
Troponin I (High Sensitivity): 3 ng/L (ref <18)
Troponin I (High Sensitivity): 3 ng/L (ref <18)

## 2023-01-14 LAB — CBC
HCT: 42.4 % (ref 36.0–46.0)
Hemoglobin: 13.1 g/dL (ref 12.0–15.0)
MCH: 27.7 pg (ref 26.0–34.0)
MCHC: 30.9 g/dL (ref 30.0–36.0)
MCV: 89.6 fL (ref 80.0–100.0)
Platelets: 261 10*3/uL (ref 150–400)
RBC: 4.73 MIL/uL (ref 3.87–5.11)
RDW: 13.2 % (ref 11.5–15.5)
WBC: 6.6 10*3/uL (ref 4.0–10.5)
nRBC: 0 % (ref 0.0–0.2)

## 2023-01-14 LAB — BASIC METABOLIC PANEL
Anion gap: 11 (ref 5–15)
BUN: 24 mg/dL — ABNORMAL HIGH (ref 6–20)
CO2: 25 mmol/L (ref 22–32)
Calcium: 10 mg/dL (ref 8.9–10.3)
Chloride: 104 mmol/L (ref 98–111)
Creatinine, Ser: 1.2 mg/dL — ABNORMAL HIGH (ref 0.44–1.00)
GFR, Estimated: 54 mL/min — ABNORMAL LOW (ref >60)
Glucose, Bld: 66 mg/dL — ABNORMAL LOW (ref 70–99)
Potassium: 3.2 mmol/L — ABNORMAL LOW (ref 3.5–5.1)
Sodium: 140 mmol/L (ref 135–145)

## 2023-01-14 MED ORDER — OXYCODONE HCL 5 MG PO TABS: 5.0000 mg | ORAL_TABLET | Freq: Four times a day (QID) | ORAL | 0 refills | Status: DC | PRN

## 2023-01-14 MED ORDER — FENTANYL CITRATE PF 50 MCG/ML IJ SOSY
25.0000 ug | PREFILLED_SYRINGE | Freq: Once | INTRAMUSCULAR | Status: AC
  Administered 2023-01-14: 25 ug via INTRAVENOUS
  Filled 2023-01-14: qty 1

## 2023-01-14 NOTE — Discharge Instructions (Signed)
As we discussed, you can take Tylenol for pain.  Have also given you oxycodone for breakthrough pain.  Please take this every 6 hours as needed.  Would like for you to follow-up with your primary care doctor for further evaluation.  You may return to the emergency room at any time for worsening symptoms.

## 2023-01-14 NOTE — ED Provider Notes (Signed)
[place] EMERGENCY DEPARTMENT AT [facility] Provider Note   CSN: [id number] Arrival date & time: 01/14/23  1217     History Chief Complaint  Patient presents with   Chest Pain    Pulmonary Emboli    Rhonda [name] is a 52 y.o. female patient with new diagnosis of pulmonary emboli on Eliquis who presents to the emergency department today for further evaluation of increased chest pain and shortness of breath and cough.  She was diagnosed with bilateral pulmonary emboli on 01/11/2023.  Patient has been taking Tylenol with little relief.  She states pain is increasing.  She also complaining of left leg pain as well.  She denies any shortness of breath.   Chest Pain      Home Medications Prior to Admission medications   Medication Sig Start Date End Date Taking? Authorizing Provider  oxyCODONE (ROXICODONE) 5 MG immediate release tablet Take 1 tablet (5 mg total) by mouth every 6 (six) hours as needed for severe pain. 01/14/23  Yes Lillee Mooneyhan M, PA-C  albuterol (VENTOLIN HFA) 108 (90 Base) MCG/ACT inhaler Inhale 2 puffs into the lungs every 6 (six) hours as needed. 06/15/22   Philip Aspen, Limmie Patricia, MD  ALPRAZolam Prudy Feeler) 0.25 MG tablet Take 1 tablet (0.25 mg total) by mouth 2 (two) times daily as needed for anxiety. 12/19/19   Philip Aspen, Limmie Patricia, MD  APIXABAN Everlene Balls) VTE STARTER PACK (10MG  AND 5MG ) Take as directed on package: start with two-5mg  tablets twice daily for 7 days. On day 8, switch to one-5mg  tablet twice daily. 01/11/23   Rancour, Jeannett Senior, MD  atorvastatin (LIPITOR) 40 MG tablet TAKE 1 TABLET(40 MG) BY MOUTH DAILY 08/18/22   Philip Aspen, Limmie Patricia, MD  benazepril-hydrochlorthiazide (LOTENSIN HCT) 20-25 MG tablet TAKE 1 TABLET BY MOUTH DAILY 01/07/23   Philip Aspen, Limmie Patricia, MD  cetirizine (ZYRTEC) 10 MG tablet Take 10 mg by mouth daily.    [provider]  docusate sodium (COLACE) 100 MG capsule Take 100 mg by mouth daily.     [provider]  esomeprazole (NEXIUM) 40 MG capsule TAKE ONE CAPSULE BY MOUTH EVERY DAY AT 12 NOON. 12/15/22   Philip Aspen, Limmie Patricia, MD  glucose blood [facility] ULTRA) test strip Use to check glucose 1 time daily 01/05/22   Philip Aspen, Limmie Patricia, MD  Lancet Devices [facility] PLUS LANCING) MISC 1 each by Does not apply route daily. 10/13/21   Philip Aspen, Limmie Patricia, MD  Lancets [facility] DELICA PLUS [place]) MISC USE AS DIRECTED ONCE DAILY 12/15/22   Philip Aspen, Limmie Patricia, MD  levothyroxine (SYNTHROID) 100 MCG tablet TAKE 1 TABLET(100 MCG) BY MOUTH DAILY BEFORE BREAKFAST 04/07/22   Reather Littler, MD  Multiple Vitamins-Minerals (MULTIVITAMIN WITH MINERALS) tablet Take 1 tablet by mouth daily.    [provider]  ondansetron (ZOFRAN) 8 MG tablet Take 1 tablet (8 mg total) by mouth every 8 (eight) hours as needed for nausea or vomiting. 05/22/19   Sherrilyn Rist, MD  Vitamin D, Ergocalciferol, (DRISDOL) 1.25 MG (50000 UNIT) CAPS capsule Take 1 capsule (50,000 Units total) by mouth every 7 (seven) days. 06/30/22   Philip Aspen, Limmie Patricia, MD      Allergies    Aspirin, Flagyl [metronidazole], Food, Tramadol, and Dilaudid [hydromorphone]    Review of Systems   Review of Systems  Cardiovascular:  Positive for chest pain.  All other systems reviewed and are negative.   Physical Exam Updated Vital Signs  BP 123/89   Pulse 60   Temp 98 F (36.7 C) (Oral)   Resp 11   SpO2 98%  Physical Exam Vitals and nursing note reviewed.  Constitutional:      General: She is not in acute distress.    Appearance: Normal appearance.  HENT:     Head: Normocephalic and atraumatic.  Eyes:     General:        Right eye: No discharge.        Left eye: No discharge.  Cardiovascular:     Comments: Regular rate and rhythm.  S1/S2 are distinct without any evidence of murmur, rubs, or gallops.  Radial pulses are 2+ bilaterally.  Dorsalis pedis pulses are 2+  bilaterally.  No evidence of pedal edema. Pulmonary:     Comments: Clear to auscultation bilaterally.  Normal effort.  No respiratory distress.  No evidence of wheezes, rales, or rhonchi heard throughout. Abdominal:     General: Abdomen is flat. Bowel sounds are normal. There is no distension.     Tenderness: There is no abdominal tenderness. There is no guarding or rebound.  Musculoskeletal:        General: Normal range of motion.     Cervical back: Neck supple.  Skin:    General: Skin is warm and dry.     Findings: No rash.  Neurological:     General: No focal deficit present.     Mental Status: She is alert.  Psychiatric:        Mood and Affect: Mood normal.        Behavior: Behavior normal.     ED Results / Procedures / Treatments   Labs (all labs ordered are listed, but only abnormal results are displayed) Labs Reviewed  BASIC METABOLIC PANEL - Abnormal; Notable for the following components:      Result Value   Potassium 3.2 (*)    Glucose, Bld 66 (*)    BUN 24 (*)    Creatinine, Ser 1.20 (*)    GFR, Estimated 54 (*)    All other components within normal limits  CBC  TROPONIN I (HIGH SENSITIVITY)  TROPONIN I (HIGH SENSITIVITY)    EKG EKG Interpretation  Date/Time:  Thursday Jan 14 2023 12:32:07 EDT Ventricular Rate:  83 PR Interval:  167 QRS Duration: 90 QT Interval:  404 QTC Calculation: 475 R Axis:   90 Text Interpretation: Sinus rhythm Borderline right axis deviation Nonspecific T abnormalities, diffuse leads Confirmed by Vivi Barrack [phone]) on 01/14/2023 1:07:00 PM  Radiology DG Chest 2 View  Result Date: 01/14/2023 CLINICAL DATA:  Chest pain and shortness of breath. EXAM: CHEST - 2 VIEW COMPARISON:  CT angio chest and two-view chest x-ray 01/11/23 FINDINGS: The heart size and mediastinal contours are within normal limits. Both lungs are clear. The visualized skeletal structures are unremarkable. IMPRESSION: Negative two view chest x-ray Electronically  Signed   By: Marin Roberts M.D.   On: 01/14/2023 13:01    Procedures Procedures    Medications Ordered in ED Medications  fentaNYL (SUBLIMAZE) injection 25 mcg (25 mcg Intravenous Given 01/14/23 1323)    ED Course/ Medical Decision Making/ A&P Clinical Course as of 01/14/23 1536  Thu Jan 14, 2023  1523 CBC Normal. [CF]  1523 Basic metabolic panel(!) Mild hypokalemia and slightly elevated creatinine. [CF]  1523 Troponin I (High Sensitivity) Initial and delta troponin were normal. [CF]  1523 DG Chest 2 View Personally ordered and interpreted the study and do not see  any evidence of acute pathology.  I do agree with radiologist interpretation. [CF]  1528 Patient's pain is significantly improved after fentanyl.  I went over all labs and imaging with her at the bedside.  All questions or concerns addressed.  I want to give her some narcotic pain medication as breakthrough pain.  Patient reasonable with this.  She is safe for discharge at this time. [CF]    Clinical Course User Index [CF] Teressa Lower, PA-C   {   Click here for ABCD2, HEART and other calculators  Medical Decision Making Jode Nido is a 52 y.o. female patient who presents to the emergency department today for further evaluation of increased level of pain.  Patient does have a diagnosis of pulmonary embolism was which is likely the source.  Considering that she is having increased level of pain and chest pain I will repeat some cardiac enzymes as well.  She has not missed any doses of her Eliquis.  I went to plan to give her some pain medication as well.  As highlighted in ED course, patient safe for discharge at this time.  All questions or concerns addressed.  All of her labs are reassuring today.  No clinical signs of ACS or heart strain today.  Will prescribe her oxycodone for breakthrough pain. Strict return precautions given.   Amount and/or Complexity of Data Reviewed Labs: ordered.  Decision-making details documented in ED Course. Radiology: ordered. Decision-making details documented in ED Course.  Risk Prescription drug management.    Final Clinical Impression(s) / ED Diagnoses Final diagnoses:  Chest pain, unspecified type    Rx / DC Orders ED Discharge Orders          Ordered    oxyCODONE (ROXICODONE) 5 MG immediate release tablet  Every 6 hours PRN        01/14/23 1530              Honor Loh [place], [place] 01/14/23 1536    Loetta Rough, MD 01/14/23 1704

## 2023-01-14 NOTE — ED Triage Notes (Signed)
Pt is here for re-evaluation as she continues to have CP and sob.  Pt reports that she is on elaquis and she was dx with PE on 5/27

## 2023-01-18 ENCOUNTER — Ambulatory Visit: Payer: BC Managed Care – PPO | Admitting: Physician Assistant

## 2023-01-19 ENCOUNTER — Encounter: Payer: Self-pay | Admitting: Internal Medicine

## 2023-01-19 ENCOUNTER — Ambulatory Visit (INDEPENDENT_AMBULATORY_CARE_PROVIDER_SITE_OTHER): Payer: Self-pay | Admitting: Internal Medicine

## 2023-01-19 VITALS — BP 108/74 | HR 94 | Temp 98.3°F | Wt 194.5 lb

## 2023-01-19 DIAGNOSIS — Z09 Encounter for follow-up examination after completed treatment for conditions other than malignant neoplasm: Secondary | ICD-10-CM

## 2023-01-19 DIAGNOSIS — R079 Chest pain, unspecified: Secondary | ICD-10-CM

## 2023-01-19 DIAGNOSIS — Z86711 Personal history of pulmonary embolism: Secondary | ICD-10-CM

## 2023-01-19 MED ORDER — APIXABAN 5 MG PO TABS: 5.0000 mg | ORAL_TABLET | Freq: Two times a day (BID) | ORAL | 0 refills | Status: DC

## 2023-01-19 NOTE — Progress Notes (Signed)
Established Patient Office Visit     CC/Reason for Visit: Emergency room follow-up  HPI: Rhonda [name] is a 52 y.o. female who is coming in today for the above mentioned reasons.  She presented to the emergency department on May 27 with chest pain.  She was found to have a pulmonary embolism.  She was placed on Eliquis.  She is having difficulty affording it due to lack of insurance.  She has not had any misses in treatment so far.  She has had some continuous chest pain for which she returned to the emergency department on May 30.  She has a stress test scheduled for this week.  She is otherwise doing well.   Past Medical/Surgical History: Past Medical History:  Diagnosis Date   Anxiety    C1q nephropathy    Cancer (HCC)    history of thyroid cancer   Chronic constipation    CKD (chronic kidney disease), stage II nephrologist-  dr Vern [facility])   secondary to C1q nephropathy/  hypertension   Family history of adverse reaction to anesthesia    father hard to put down for anesthesia   GERD (gastroesophageal reflux disease)    H/O radioactive iodine thyroid ablation    1992  right throid   History of exercise intolerance    01-052011  ETT--  normal w/ no ischemia per cardiologist (dr Shirlee Latch) epic note   History of pulmonary embolus (PE)    07/ 2008-- treated w/ coumadin   History of syncope    recurrent --  workup done per epic -- dx orthostatic hypotension   Hyperlipidemia    Hypertension    IBS (irritable bowel syndrome)    Neuromuscular disorder (HCC)    carpal tunnel   Pelvic pain in female    Post-surgical hypothyroidism    1991 left thyroidectomy   Pre-diabetes    Proteinuria    Seasonal and perennial allergic rhinitis    Wears contact lenses    Wears glasses     Past Surgical History:  Procedure Laterality Date   4 HOUR PH STUDY N/A 11/16/2016   Procedure: 24 HOUR PH STUDY;  Surgeon: Sherrilyn Rist, MD;   [facility]: [facility];  Service: Gastroenterology;  Laterality: N/A;   ABDOMINAL HYSTERECTOMY  1998   COLONOSCOPY     COLONOSCOPY WITH PROPOFOL N/A 08/05/2021   Procedure: COLONOSCOPY WITH PROPOFOL;  Surgeon: Imogene Burn, MD;  [facility]: [facility];  Service: Gastroenterology;  Laterality: N/A;   CYSTOSCOPY N/A 04/28/2016   Procedure: CYSTOSCOPY;  Surgeon: Richardean Chimera, MD;  Location: [facility];  Service: Gynecology;  Laterality: N/A;   ESOPHAGEAL MANOMETRY N/A 11/16/2016   Procedure: ESOPHAGEAL MANOMETRY (EM);  Surgeon: Sherrilyn Rist, MD;  [facility];  Service: Gastroenterology;  Laterality: N/A;   ESOPHAGOGASTRODUODENOSCOPY     LAPAROSCOPIC CHOLECYSTECTOMY  1997   LAPAROSCOPY N/A 03/26/2016   Procedure: LAPAROSCOPY DIAGNOSTIC, LYSIS OF ADHESIONS;  Surgeon: Richardean Chimera, MD;  [facility];  Service: Gynecology;  Laterality: N/A;   LAPAROTOMY N/A 04/28/2016   Procedure: EXPLORATORY LAPAROTOMY;  Surgeon: Richardean Chimera, MD;  Location: [facility];  Service: Gynecology;  Laterality: N/A;   RENAL BIOPSY     SALPINGOOPHORECTOMY Bilateral 04/28/2016   Procedure: Bilateral OOPHORECTOMY;  Surgeon: Richardean Chimera, MD;  Location: [facility];  Service: Gynecology;  Laterality: Bilateral;   THYROIDECTOMY Left 1991   TRANSTHORACIC ECHOCARDIOGRAM  04/27/2012   normal LV, ef 60-65%/  trivial  MR   TUBAL LIGATION  1992    Social History:  reports that she has never smoked. She has never used smokeless tobacco. She reports that she does not drink alcohol and does not use drugs.  Allergies: Allergies  Allergen Reactions   Aspirin Hives   Flagyl [Metronidazole] Nausea And Vomiting    With oral flagyl.   Has tolerated vaginal flagyl.     Food Other (See Comments)    Lima beans--  Positive allergy test    Tramadol Other (See Comments)    Headache    Dilaudid [Hydromorphone] Itching    Family History:  Family History  Problem Relation Age of Onset   Diabetes Mother         Pre-diabetic   Hypertension Mother    Hypertension Father    Coronary artery disease Father    Diabetes Father    Stroke Father    Prostate cancer Father    Breast cancer Maternal Aunt    Thyroid disease Maternal Aunt    Colon cancer Neg Hx    Colon polyps Neg Hx    Kidney disease Neg Hx    Esophageal cancer Neg Hx    Gallbladder disease Neg Hx    Stomach cancer Neg Hx    Rectal cancer Neg Hx      Current Outpatient Medications:    albuterol (VENTOLIN HFA) 108 (90 Base) MCG/ACT inhaler, Inhale 2 puffs into the lungs every 6 (six) hours as needed., Disp: 6.7 g, Rfl: 1   ALPRAZolam (XANAX) 0.25 MG tablet, Take 1 tablet (0.25 mg total) by mouth 2 (two) times daily as needed for anxiety., Disp: 60 tablet, Rfl: 0   apixaban (ELIQUIS) 5 MG TABS tablet, Take 1 tablet (5 mg total) by mouth 2 (two) times daily., Disp: 56 tablet, Rfl: 0   atorvastatin (LIPITOR) 40 MG tablet, TAKE 1 TABLET(40 MG) BY MOUTH DAILY, Disp: 90 tablet, Rfl: 1   benazepril-hydrochlorthiazide (LOTENSIN HCT) 20-25 MG tablet, TAKE 1 TABLET BY MOUTH DAILY, Disp: 90 tablet, Rfl: 1   cetirizine (ZYRTEC) 10 MG tablet, Take 10 mg by mouth daily., Disp: , Rfl:    docusate sodium (COLACE) 100 MG capsule, Take 100 mg by mouth daily., Disp: , Rfl:    esomeprazole (NEXIUM) 40 MG capsule, TAKE ONE CAPSULE BY MOUTH EVERY DAY AT 12 NOON., Disp: 90 capsule, Rfl: 1   glucose blood (ONETOUCH ULTRA) test strip, Use to check glucose 1 time daily, Disp: 100 each, Rfl: 12   Lancet Devices (ONETOUCH DELICA PLUS LANCING) MISC, 1 each by Does not apply route daily., Disp: 100 each, Rfl: 12   Lancets (ONETOUCH DELICA PLUS LANCET33G) MISC, USE AS DIRECTED ONCE DAILY, Disp: 100 each, Rfl: 3   levothyroxine (SYNTHROID) 100 MCG tablet, TAKE 1 TABLET(100 MCG) BY MOUTH DAILY BEFORE BREAKFAST, Disp: 90 tablet, Rfl: 3   Multiple Vitamins-Minerals (MULTIVITAMIN WITH MINERALS) tablet, Take 1 tablet by mouth daily., Disp: , Rfl:    ondansetron (ZOFRAN) 8  MG tablet, Take 1 tablet (8 mg total) by mouth every 8 (eight) hours as needed for nausea or vomiting., Disp: 30 tablet, Rfl: 0   oxyCODONE (ROXICODONE) 5 MG immediate release tablet, Take 1 tablet (5 mg total) by mouth every 6 (six) hours as needed for severe pain., Disp: 15 tablet, Rfl: 0   Vitamin D, Ergocalciferol, (DRISDOL) 1.25 MG (50000 UNIT) CAPS capsule, Take 1 capsule (50,000 Units total) by mouth every 7 (seven) days., Disp: 12 capsule, Rfl: 0  Review of  Systems:  Negative unless indicated in HPI.   Physical Exam: Vitals:   01/19/23 1449  BP: 108/74  Pulse: 94  Temp: 98.3 F (36.8 C)  TempSrc: Oral  SpO2: 99%  Weight: 194 lb 8 oz (88.2 kg)    Body mass index is 32.37 kg/m.   Physical Exam Vitals reviewed.  Constitutional:      Appearance: Normal appearance.  HENT:     Head: Normocephalic and atraumatic.  Eyes:     Conjunctiva/sclera: Conjunctivae normal.     Pupils: Pupils are equal, round, and reactive to light.  Cardiovascular:     Rate and Rhythm: Normal rate and regular rhythm.  Pulmonary:     Effort: Pulmonary effort is normal.     Breath sounds: Normal breath sounds.  Skin:    General: Skin is warm and dry.  Neurological:     General: No focal deficit present.     Mental Status: She is alert and oriented to person, place, and time.  Psychiatric:        Mood and Affect: Mood normal.        Behavior: Behavior normal.        Thought Content: Thought content normal.        Judgment: Judgment normal.      Impression and Plan:  Hospital discharge follow-up  History of pulmonary embolism  Other orders -     Apixaban; Take 1 tablet (5 mg total) by mouth 2 (two) times daily.  Dispense: 56 tablet; Refill: 0   [facility] charts reviewed in detail. -Put in touch with our pharmacist who has filled out patient assistance forms for Eliquis as she currently does not have insurance.  Samples provided.  She has been adherent to therapy.  Today is her last  day of 10 mg twice daily, she will start 5 mg twice daily tomorrow. -Chest pain is improved.  She is scheduled for stress test with cardiology this week.  Time spent:31 minutes reviewing chart, interviewing and examining patient and formulating plan of care.     Chaya Jan, MD [facility]

## 2023-01-22 ENCOUNTER — Ambulatory Visit (INDEPENDENT_AMBULATORY_CARE_PROVIDER_SITE_OTHER): Payer: Self-pay | Admitting: Cardiology

## 2023-01-22 ENCOUNTER — Encounter (HOSPITAL_BASED_OUTPATIENT_CLINIC_OR_DEPARTMENT_OTHER): Payer: Self-pay | Admitting: Cardiology

## 2023-01-22 ENCOUNTER — Telehealth: Payer: Self-pay | Admitting: Licensed Clinical Social Worker

## 2023-01-22 VITALS — BP 106/70 | HR 81 | Ht 65.0 in | Wt 194.0 lb

## 2023-01-22 DIAGNOSIS — I2694 Multiple subsegmental pulmonary emboli without acute cor pulmonale: Secondary | ICD-10-CM

## 2023-01-22 DIAGNOSIS — Z7189 Other specified counseling: Secondary | ICD-10-CM

## 2023-01-22 DIAGNOSIS — E785 Hyperlipidemia, unspecified: Secondary | ICD-10-CM

## 2023-01-22 DIAGNOSIS — I10 Essential (primary) hypertension: Secondary | ICD-10-CM

## 2023-01-22 DIAGNOSIS — I479 Paroxysmal tachycardia, unspecified: Secondary | ICD-10-CM

## 2023-01-22 DIAGNOSIS — R079 Chest pain, unspecified: Secondary | ICD-10-CM

## 2023-01-22 NOTE — Progress Notes (Signed)
Cardiology Office Note:    Date:  01/22/2023   ID:  Rhonda [name], DOB Apr 01, 1971, MRN [id number]  PCP:  Rhonda [name], Rhonda Patricia, MD  Cardiologist:  Rhonda Red, MD  Referring MD: Rhonda [name], Estel*   CC: follow up  History of Present Illness:    Rhonda [name] is a 52 y.o. female with a hx of pulmonary embolism, anxiety, hypertension, hyperlipidemia, hypothyroidism, GERD, PE, thyroid cancer s/p left thyroidectomy (1991), and CKD, who is seen for follow up today. I initially met her 01/09/22 as a new consult at the request of Rhonda [name], Rhonda Bar* for the evaluation and management of palpitations, hypertension and tachycardia.  Today: Reviewed recent hospitalization. Had prior blood clot in 2008. Noted progressive chest pain for 2 days prior to presentation 01/11/23, found to have bilateral small PE. Started on apixaban. Recommended outpatient cardiology evaluation for chest pain. hsTn negative x2 in the ER.   She was self insured currently, but was able to get assistance with eliquis for a year. She is starting a new job July 1 and is hopeful to have insurance coverage with her job.   Has been having 4-5 episodes in the evening in the last 6 mos. Usually if she falls asleep on the couch or bed, she wakes up feeling like she can breath. Feels flushed. Has a history of hot flashes, unsure if these are the etiology. Check BP/HR/sugar during these events, all good. Sometimes has nausea with them. Episodes last only briefly. Gets better with relaxing, walking around.  Denies shortness of breath at rest or with normal exertion. No orthopnea, LE edema or unexpected weight gain. No syncope or palpitations.   Past Medical History:  Diagnosis Date   Anxiety    C1q nephropathy    Cancer (HCC)    history of thyroid cancer   Chronic constipation    CKD (chronic kidney disease), stage II nephrologist-  dr [name])    secondary to C1q nephropathy/  hypertension   Family history of adverse reaction to anesthesia    father hard to put down for anesthesia   GERD (gastroesophageal reflux disease)    H/O radioactive iodine thyroid ablation    1992  right throid   History of exercise intolerance    01-052011  ETT--  normal w/ no ischemia per cardiologist (dr Shirlee Latch) epic note   History of pulmonary embolus (PE)    07/ 2008-- treated w/ coumadin   History of syncope    recurrent --  workup done per epic -- dx orthostatic hypotension   Hyperlipidemia    Hypertension    IBS (irritable bowel syndrome)    Neuromuscular disorder (HCC)    carpal tunnel   Pelvic pain in female    Post-surgical hypothyroidism    1991 left thyroidectomy   Pre-diabetes    Proteinuria    Seasonal and perennial allergic rhinitis    Wears contact lenses    Wears glasses     Past Surgical History:  Procedure Laterality Date   22 HOUR PH STUDY N/A 11/16/2016   Procedure: 24 HOUR PH STUDY;  Surgeon: Sherrilyn Rist, MD;  [facility];  Service: Gastroenterology;  Laterality: N/A;   ABDOMINAL HYSTERECTOMY  1998   COLONOSCOPY     COLONOSCOPY WITH PROPOFOL N/A 08/05/2021   Procedure: COLONOSCOPY WITH PROPOFOL;  Surgeon: Imogene Burn, MD;  [facility];  Service: Gastroenterology;  Laterality: N/A;   CYSTOSCOPY N/A 04/28/2016   Procedure: CYSTOSCOPY;  Surgeon: Richardean Chimera, MD;  [facility];  Service: Gynecology;  Laterality: N/A;   ESOPHAGEAL MANOMETRY N/A 11/16/2016   Procedure: ESOPHAGEAL MANOMETRY (EM);  Surgeon: Sherrilyn Rist, MD;  [facility];  Service: Gastroenterology;  Laterality: N/A;   ESOPHAGOGASTRODUODENOSCOPY     LAPAROSCOPIC CHOLECYSTECTOMY  1997   LAPAROSCOPY N/A 03/26/2016   Procedure: LAPAROSCOPY DIAGNOSTIC, LYSIS OF ADHESIONS;  Surgeon: Richardean Chimera, MD;  Location: [facility];  Service: Gynecology;  Laterality: N/A;   LAPAROTOMY N/A 04/28/2016    Procedure: EXPLORATORY LAPAROTOMY;  Surgeon: Richardean Chimera, MD;  Location[facility];  Service: Gynecology;  Laterality: N/A;   RENAL BIOPSY     SALPINGOOPHORECTOMY Bilateral 04/28/2016   Procedure: Bilateral OOPHORECTOMY;  Surgeon: Richardean Chimera, MD;  Location: [facility];  Service: Gynecology;  Laterality: Bilateral;   THYROIDECTOMY Left 1991   TRANSTHORACIC ECHOCARDIOGRAM  04/27/2012   normal LV, ef 60-65%/  trivial MR   TUBAL LIGATION  1992    Current Medications: Current Outpatient Medications on File Prior to Visit  Medication Sig   albuterol (VENTOLIN HFA) 108 (90 Base) MCG/ACT inhaler Inhale 2 puffs into the lungs every 6 (six) hours as needed.   ALPRAZolam (XANAX) 0.25 MG tablet Take 1 tablet (0.25 mg total) by mouth 2 (two) times daily as needed for anxiety.   apixaban (ELIQUIS) 5 MG TABS tablet Take 1 tablet (5 mg total) by mouth 2 (two) times daily.   atorvastatin (LIPITOR) 40 MG tablet TAKE 1 TABLET(40 MG) BY MOUTH DAILY   benazepril-hydrochlorthiazide (LOTENSIN HCT) 20-25 MG tablet TAKE 1 TABLET BY MOUTH DAILY   cetirizine (ZYRTEC) 10 MG tablet Take 10 mg by mouth daily.   docusate sodium (COLACE) 100 MG capsule Take 100 mg by mouth daily.   esomeprazole (NEXIUM) 40 MG capsule TAKE ONE CAPSULE BY MOUTH EVERY DAY AT 12 NOON.   glucose blood (ONETOUCH ULTRA) test strip Use to check glucose 1 time daily   Lancet Devices (ONETOUCH DELICA PLUS LANCING) MISC 1 each by Does not apply route daily.   Lancets (ONETOUCH DELICA PLUS LANCET33G) MISC USE AS DIRECTED ONCE DAILY   levothyroxine (SYNTHROID) 100 MCG tablet TAKE 1 TABLET(100 MCG) BY MOUTH DAILY BEFORE BREAKFAST   Multiple Vitamins-Minerals (MULTIVITAMIN WITH MINERALS) tablet Take 1 tablet by mouth daily.   ondansetron (ZOFRAN) 8 MG tablet Take 1 tablet (8 mg total) by mouth every 8 (eight) hours as needed for nausea or vomiting.   oxyCODONE (ROXICODONE) 5 MG immediate release tablet Take 1 tablet (5 mg total) by mouth every 6 (six) hours  as needed for severe pain.   No current facility-administered medications on file prior to visit.     Allergies:   Aspirin, Flagyl [metronidazole], Food, Tramadol, and Dilaudid [hydromorphone]   Social History   Tobacco Use   Smoking status: Never   Smokeless tobacco: Never  Vaping Use   Vaping Use: Never used  Substance Use Topics   Alcohol use: No    Alcohol/week: 0.0 standard drinks of alcohol   Drug use: No    Family History: family history includes Breast cancer in her maternal aunt; Coronary artery disease in her father; Diabetes in her father and mother; Hypertension in her father and mother; Prostate cancer in her father; Stroke in her father; Thyroid disease in her maternal aunt. There is no history of Colon cancer, Colon polyps, Kidney disease, Esophageal cancer, Gallbladder disease, Stomach cancer, or Rectal cancer. Father had 2 heart attacks, a stroke, and prostate cancer,  which started in his 31's.   ROS:    Please see the history of present illness.  Additional pertinent ROS otherwise unremarkable.   EKGs/Labs/Other Studies Reviewed:    The following studies were reviewed today: Monitor 04/24/22: Patch Wear Time:  11 days and 5 hours   Patient had a min HR of 27 bpm, max HR of 158 bpm, and avg HR of 78 bpm. Predominant underlying rhythm was Sinus Rhythm. 1 run of Supraventricular Tachycardia occurred lasting 7 beats with a max rate of 150 bpm (avg 143 bpm). No VT, atrial fibrillation, high degree block, or pauses noted. Isolated atrial and ventricular ectopy was rare (<1%). There were 11 triggered events, which were sinus rhythm. No significant arrhythmias detected.  Echo 04/28/2012 Left ventricle: The cavity size was normal. Systolic  function was normal. The estimated ejection fraction was in  the range of 60% to 65%. Wall motion was normal; there were  no regional wall motion abnormalities.   EKG:  EKG is personally reviewed.   01/22/23: not ordered  today 01/09/22: NSR at 62 bpm  Recent Labs: 02/13/2022: Magnesium 1.9 06/15/2022: ALT 16 07/31/2022: TSH 1.15 01/14/2023: BUN 24; Creatinine, Ser 1.20; Hemoglobin 13.1; Platelets 261; Potassium 3.2; Sodium 140  Recent Lipid Panel    Component Value Date/Time   CHOL 150 06/15/2022 1016   TRIG 65.0 06/15/2022 1016   HDL 47.50 06/15/2022 1016   CHOLHDL 3 06/15/2022 1016   VLDL 13.0 06/15/2022 1016   LDLCALC 89 06/15/2022 1016   LDLCALC 84 04/23/2020 0833   LDLDIRECT 143.9 06/06/2013 1014    Physical Exam:    VS:  BP 106/70   Pulse 81   Ht 5\' 5"  (1.651 m)   Wt 194 lb (88 kg)   SpO2 98%   BMI 32.28 kg/m     Wt Readings from Last 3 Encounters:  01/22/23 194 lb (88 kg)  01/19/23 194 lb 8 oz (88.2 kg)  01/11/23 195 lb (88.5 kg)    GEN: Well nourished, well developed in no acute distress HEENT: Normal, moist mucous membranes NECK: No JVD CARDIAC: regular rhythm, normal S1 and S2, no rubs or gallops. No murmur. VASCULAR: Radial and DP pulses 2+ bilaterally. No carotid bruits RESPIRATORY:  Clear to auscultation without rales, wheezing or rhonchi  ABDOMEN: Soft, non-tender, non-distended MUSCULOSKELETAL:  Ambulates independently SKIN: Warm and dry, no edema NEUROLOGIC:  Alert and oriented x 3. No focal neuro deficits noted. PSYCHIATRIC:  Normal affect    ASSESSMENT:    1. Multiple subsegmental pulmonary emboli without acute cor pulmonale (HCC)   2. Chest pain of uncertain etiology   3. Essential hypertension   4. Paroxysmal tachycardia (HCC)   5. Hyperlipidemia, unspecified hyperlipidemia type   6. Cardiac risk counseling     PLAN:    Recurrent thromboembolism, with recent small bilateral PE -now second thromboembolic event, likely lifelong anticoagulation  Chest pain -gradually improving, though if still present or worsened at 3 mos follow up, consider additional testing -reviewed [name] flag warning signs that need immediate medical attention -hsTn have been negative  on 2 separate visits -slowly improving, suspect may be 2/2 PE  Palpitations Paroxysmal tachycardia -see Zio monitor  Hypertension -at goal, continue benazepril-HCTZ  Hyperlipidemia -continue atorvastatin  Cardiac risk counseling and prevention recommendations: -recommend heart healthy/Mediterranean diet, with whole grains, fruits, vegetable, fish, lean meats, nuts, and olive oil. Limit salt. -recommend moderate walking, 3-5 times/week for 30-50 minutes each session. Aim for at least 150 minutes.week. Goal should be  pace of 3 miles/hours, or walking 1.5 miles in 30 minutes -recommend avoidance of tobacco products. Avoid excess alcohol.  Plan for follow up: 3 mos  Rhonda Red, MD, PhD, [facility]  [facility]    Medication Adjustments/Labs and Tests Ordered: Current medicines are reviewed at length with the patient today.  Concerns regarding medicines are outlined above.  No orders of the defined types were placed in this encounter.  No orders of the defined types were placed in this encounter.   Patient Instructions  Medication Instructions:  Your physician recommends that you continue on your current medications as directed. Please refer to the Current Medication list given to you today.  *If you need a refill on your cardiac medications before your next appointment, please call your pharmacy*  Follow-Up: At [facility], you and your health needs are our priority.  As part of our continuing mission to provide you with exceptional heart care, we have created designated Provider Care Teams.  These Care Teams include your primary Cardiologist (physician) and Advanced Practice Providers (APPs -  Physician Assistants and Nurse Practitioners) who all work together to provide you with the care you need, when you need it.  We recommend signing up for the patient portal called "MyChart".  Sign up information is provided on this After Visit Summary.  MyChart  is used to connect with patients for Virtual Visits (Telemedicine).  Patients are able to view lab/test results, encounter notes, upcoming appointments, etc.  Non-urgent messages can be sent to your provider as well.   To learn more about what you can do with MyChart, go to [email or web address].    Your next appointment:   3 month(s)  Provider:   Jodelle Red, MD       Signed, Rhonda Red, MD PhD 01/22/2023     [facility]

## 2023-01-22 NOTE — Telephone Encounter (Signed)
H&V Care Navigation CSW Progress Note  Clinical Social Worker  mailed pt a Haematologist  to complete if interested in/eligible for additional discount on medical bills from recent hospital visits. Per MD pt is starting a new job in July with insurance and was approved for Eliquis PAP. I included my card should pt have any questions regarding how to complete.   Patient is participating in a Managed Medicaid Plan:  No, self pay only.  SDOH Screenings   Food Insecurity: No Food Insecurity (08/14/2022)  Housing: Low Risk  (08/14/2022)  Transportation Needs: No Transportation Needs (08/14/2022)  Alcohol Screen: Low Risk  (01/09/2022)  Depression (PHQ2-9): Low Risk  (01/19/2023)  Financial Resource Strain: Low Risk  (08/14/2022)  Physical Activity: Unknown (08/14/2022)  Social Connections: Moderately Integrated (08/14/2022)  Stress: Stress Concern Present (08/14/2022)  Tobacco Use: Low Risk  (01/22/2023)   Octavio Graves, MSW, LCSW Clinical Social Worker II [facility]Vascular Care Navigation  [phone]- work cell phone (preferred) [phone]- desk phone

## 2023-01-22 NOTE — Patient Instructions (Signed)
Medication Instructions:  Your physician recommends that you continue on your current medications as directed. Please refer to the Current Medication list given to you today.  *If you need a refill on your cardiac medications before your next appointment, please call your pharmacy*   Follow-Up: At [facility], you and your health needs are our priority.  As part of our continuing mission to provide you with exceptional heart care, we have created designated Provider Care Teams.  These Care Teams include your primary Cardiologist (physician) and Advanced Practice Providers (APPs -  Physician Assistants and Nurse Practitioners) who all work together to provide you with the care you need, when you need it.  We recommend signing up for the patient portal called "MyChart".  Sign up information is provided on this After Visit Summary.  MyChart is used to connect with patients for Virtual Visits (Telemedicine).  Patients are able to view lab/test results, encounter notes, upcoming appointments, etc.  Non-urgent messages can be sent to your provider as well.   To learn more about what you can do with MyChart, go to [email or web address].    Your next appointment:   3 month(s)  Provider:   Bridgette Christopher, MD    

## 2023-02-02 ENCOUNTER — Inpatient Hospital Stay: Payer: Self-pay | Admitting: Internal Medicine

## 2023-02-11 ENCOUNTER — Emergency Department (HOSPITAL_COMMUNITY)
Admission: EM | Admit: 2023-02-11 | Discharge: 2023-02-12 | Disposition: A | Discharge status: Home / Self Care | Payer: Self-pay | Attending: Emergency Medicine | Admitting: Emergency Medicine

## 2023-02-11 ENCOUNTER — Encounter (HOSPITAL_COMMUNITY): Payer: Self-pay | Admitting: Emergency Medicine

## 2023-02-11 ENCOUNTER — Encounter: Payer: Self-pay | Admitting: Internal Medicine

## 2023-02-11 ENCOUNTER — Emergency Department (HOSPITAL_COMMUNITY): Payer: Self-pay

## 2023-02-11 DIAGNOSIS — N182 Chronic kidney disease, stage 2 (mild): Secondary | ICD-10-CM | POA: Insufficient documentation

## 2023-02-11 DIAGNOSIS — R051 Acute cough: Secondary | ICD-10-CM | POA: Insufficient documentation

## 2023-02-11 DIAGNOSIS — Z7901 Long term (current) use of anticoagulants: Secondary | ICD-10-CM | POA: Insufficient documentation

## 2023-02-11 DIAGNOSIS — Z85850 Personal history of malignant neoplasm of thyroid: Secondary | ICD-10-CM | POA: Insufficient documentation

## 2023-02-11 DIAGNOSIS — R072 Precordial pain: Secondary | ICD-10-CM | POA: Insufficient documentation

## 2023-02-11 LAB — TROPONIN I (HIGH SENSITIVITY): Troponin I (High Sensitivity): 3 ng/L (ref <18)

## 2023-02-11 LAB — COMPREHENSIVE METABOLIC PANEL
ALT: 19 U/L (ref 0–44)
AST: 19 U/L (ref 15–41)
Albumin: 3.9 g/dL (ref 3.5–5.0)
Alkaline Phosphatase: 92 U/L (ref 38–126)
Anion gap: 8 (ref 5–15)
BUN: 32 mg/dL — ABNORMAL HIGH (ref 6–20)
CO2: 26 mmol/L (ref 22–32)
Calcium: 9.9 mg/dL (ref 8.9–10.3)
Chloride: 105 mmol/L (ref 98–111)
Creatinine, Ser: 1.43 mg/dL — ABNORMAL HIGH (ref 0.44–1.00)
GFR, Estimated: 44 mL/min — ABNORMAL LOW (ref >60)
Glucose, Bld: 97 mg/dL (ref 70–99)
Potassium: 3.8 mmol/L (ref 3.5–5.1)
Sodium: 139 mmol/L (ref 135–145)
Total Bilirubin: 0.5 mg/dL (ref 0.3–1.2)
Total Protein: 8.5 g/dL — ABNORMAL HIGH (ref 6.5–8.1)

## 2023-02-11 LAB — CBC
HCT: 38.8 % (ref 36.0–46.0)
Hemoglobin: 12.4 g/dL (ref 12.0–15.0)
MCH: 28.4 pg (ref 26.0–34.0)
MCHC: 32 g/dL (ref 30.0–36.0)
MCV: 88.8 fL (ref 80.0–100.0)
Platelets: 254 10*3/uL (ref 150–400)
RBC: 4.37 MIL/uL (ref 3.87–5.11)
RDW: 13.2 % (ref 11.5–15.5)
WBC: 7 10*3/uL (ref 4.0–10.5)
nRBC: 0 % (ref 0.0–0.2)

## 2023-02-11 MED ORDER — AZITHROMYCIN 250 MG PO TABS: 250.0000 mg | ORAL_TABLET | Freq: Every day | ORAL | 0 refills | Status: DC

## 2023-02-11 MED ORDER — BENZONATATE 100 MG PO CAPS: 100.0000 mg | ORAL_CAPSULE | Freq: Three times a day (TID) | ORAL | 0 refills | Status: DC | PRN

## 2023-02-11 NOTE — Discharge Instructions (Addendum)
There is no evidence of heart attack.  Continue your medications including your Eliquis.  Follow-up with your primary doctor as well as cardiologist.  Return to the ED with exertional chest pain, pain associate with shortness of breath, nausea, vomiting, sweating or other concerns.

## 2023-02-11 NOTE — ED Triage Notes (Signed)
Pt awakened with chest pain and left arm throbbing. Intermittent pain and SOB throughout the day as well as cough. Remains on Eliquis for PE on 5/27.

## 2023-02-11 NOTE — ED Provider Notes (Signed)
Emergency Department Provider Note   I have reviewed the triage vital signs and the nursing notes.   HISTORY  Chief Complaint Chest Pain   HPI Rhonda [name] is a 52 y.o. female with PMH of CKD and PE on Eliquis presents to the ED for evaluation of chest pressure/tightness radiating into the left arm.  She has an associated dry cough.  She was diagnosed with PE on 5/27 and started on Eliquis.  She has had DVT and PE in the past. No fever although does have a dry cough.   Past Medical History:  Diagnosis Date   Anxiety    C1q nephropathy    Cancer (HCC)    history of thyroid cancer   Chronic constipation    CKD (chronic kidney disease), stage II nephrologist-  dr Vern [facility])   secondary to C1q nephropathy/  hypertension   Family history of adverse reaction to anesthesia    father hard to put down for anesthesia   GERD (gastroesophageal reflux disease)    H/O radioactive iodine thyroid ablation    1992  right throid   History of exercise intolerance    01-052011  ETT--  normal w/ no ischemia per cardiologist (dr Shirlee Latch) epic note   History of pulmonary embolus (PE)    07/ 2008-- treated w/ coumadin   History of syncope    recurrent --  workup done per epic -- dx orthostatic hypotension   Hyperlipidemia    Hypertension    IBS (irritable bowel syndrome)    Neuromuscular disorder (HCC)    carpal tunnel   Pelvic pain in female    Post-surgical hypothyroidism    1991 left thyroidectomy   Pre-diabetes    Proteinuria    Seasonal and perennial allergic rhinitis    Wears contact lenses    Wears glasses     Review of Systems  Constitutional: No fever/chills Cardiovascular: Positive chest pain. Respiratory: Denies shortness of breath. Positive cough.  Gastrointestinal: No abdominal pain.  No nausea, no vomiting.  No diarrhea.  Skin: Negative for rash. Neurological: Negative for  headaches.  ____________________________________________   PHYSICAL EXAM:  VITAL SIGNS: ED Triage Vitals  Enc Vitals Group     BP 02/11/23 2110 (!) 121/91     Pulse Rate 02/11/23 2110 67     Resp 02/11/23 2110 13     Temp 02/11/23 2109 98.1 F (36.7 C)     Temp src --      SpO2 02/11/23 2110 100 %     Weight 02/11/23 2109 193 lb (87.5 kg)     Height 02/11/23 2109 5\' 5"  (1.651 m)   Constitutional: Alert and oriented. Well appearing and in no acute distress. Eyes: Conjunctivae are normal.  Head: Atraumatic. Nose: No congestion/rhinnorhea. Mouth/Throat: Mucous membranes are moist.  Neck: No stridor.  Cardiovascular: Normal rate, regular rhythm. Good peripheral circulation. Grossly normal heart sounds.   Respiratory: Normal respiratory effort.  No retractions. Lungs CTAB. Gastrointestinal: Soft and nontender. No distention.  Musculoskeletal: No lower extremity tenderness nor edema. No gross deformities of extremities. Neurologic:  Normal speech and language. No gross focal neurologic deficits are appreciated.  Skin:  Skin is warm, dry and intact. No rash noted.  ____________________________________________   LABS (all labs ordered are listed, but only abnormal results are displayed)  Labs Reviewed  COMPREHENSIVE METABOLIC PANEL - Abnormal; Notable for the following components:      Result Value   BUN 32 (*)    Creatinine,  Ser 1.43 (*)    Total Protein 8.5 (*)    GFR, Estimated 44 (*)    All other components within normal limits  CBC  TROPONIN I (HIGH SENSITIVITY)  TROPONIN I (HIGH SENSITIVITY)   ____________________________________________  EKG   EKG Interpretation Date/Time:  Friday February 12 2023 02:19:20 EDT Ventricular Rate:  82 PR Interval:  175 QRS Duration:  67 QT Interval:  470 QTC Calculation: 549 R Axis:   74  Text Interpretation: Sinus rhythm Low voltage, precordial leads Borderline T abnormalities, diffuse leads Prolonged QT interval No significant  change was found Confirmed by Glynn Octave [phone]) on 02/12/2023 2:22:10 AM        ____________________________________________  RADIOLOGY  DG Chest 2 View  Result Date: 02/11/2023 CLINICAL DATA:  Chest pain. EXAM: CHEST - 2 VIEW COMPARISON:  Chest radiograph dated Jan 14, 2023 FINDINGS: The heart size and mediastinal contours are within normal limits. Both lungs are clear. The visualized skeletal structures are unremarkable. IMPRESSION: No active cardiopulmonary disease. Electronically Signed   By: Larose Hires D.O.   On: 02/11/2023 21:36    ____________________________________________   PROCEDURES  Procedure(s) performed:   Procedures  None  ____________________________________________   INITIAL IMPRESSION / ASSESSMENT AND PLAN / ED COURSE  Pertinent labs & imaging results that were available during my care of the patient were reviewed by me and considered in my medical decision making (see chart for details).   This patient is Presenting for Evaluation of CP, which does require a range of treatment options, and is a complaint that involves a high risk of morbidity and mortality.  The Differential Diagnoses includes but is not exclusive to acute coronary syndrome, aortic dissection, pulmonary embolism, cardiac tamponade, community-acquired pneumonia, pericarditis, musculoskeletal chest wall pain, etc.  I did obtain Additional Historical Information from husband at bedside.  I decided to review pertinent External Data, and in summary patient diagnosed with PE on 5/27. Repeat ED visit for pain on 5/30.   Clinical Laboratory Tests Ordered, included CBC without leukocytosis. No anemia.   Radiologic Tests Ordered, included CXR. I independently interpreted the images and agree with radiology interpretation.   Cardiac Monitor Tracing which shows bradycardia.    Social Determinants of Health Risk patient is a non-smoker.   Medical Decision Making: Summary:  Patient  presents to the ED with CP and cough. Seems most consistent with bronchitis clinically. Has small, non-occlusive PE on CT earlier this month. Complaint with medications. Has some baseline renal insufficiency. Will not repeat CTA in this case. Vitals are not consistent with large PE. Plan for CXR, troponin, and reassess.  Reevaluation with update and discussion with patient. Initial troponin negative. Plan for second troponin and if negative will d/c with bronchitis mgmt medications. Care transferred to Dr. Manus Gunning.   Considered admission but workup is in progress.   Patient's presentation is most consistent with acute presentation with potential threat to life or bodily function.   Disposition: discharge  ____________________________________________  FINAL CLINICAL IMPRESSION(S) / ED DIAGNOSES  Final diagnoses:  Precordial chest pain  Acute cough     NEW OUTPATIENT MEDICATIONS STARTED DURING THIS VISIT:  Discharge Medication List as of 02/12/2023  2:24 AM     START taking these medications   Details  azithromycin (ZITHROMAX) 250 MG tablet Take 1 tablet (250 mg total) by mouth daily. Take first 2 tablets together, then 1 every day until finished., Starting Thu 02/11/2023, Normal    benzonatate (TESSALON) 100 MG capsule Take 1 capsule (  100 mg total) by mouth 3 (three) times daily as needed for cough., Starting Thu 02/11/2023, Normal        Note:  This document was prepared using Dragon voice recognition software and may include unintentional dictation errors.  Alona Bene, MD, [facility] Emergency Medicine    Jolynn Bajorek, Arlyss Repress, MD 02/12/23 [phone]

## 2023-02-12 LAB — TROPONIN I (HIGH SENSITIVITY): Troponin I (High Sensitivity): 3 ng/L (ref <18)

## 2023-02-12 NOTE — ED Provider Notes (Signed)
Care assumed from Dr. Jacqulyn Bath.  Patient with central chest pain and pressure that radiates to her left arm.  Awaiting second troponin.  Recently diagnosed with PE and states compliance with Eliquis.  Troponin is negative x 2.  Repeat EKG was unchanged.  Patient had a discussion with Dr. Jacqulyn Bath about performing repeat CT scan.  With shared decision making was decided not to pursue at this time given her compliance with Eliquis, no tachycardia no hypoxia.  Patient reassured.  Follow-up with her PCP as well as cardiologist.  Return to the ED with exertional chest pain, pain associate with shortness of breath, nausea, vomiting, sweating or other concerns.     Glynn Octave, MD 02/12/23 [phone]

## 2023-02-16 ENCOUNTER — Other Ambulatory Visit: Payer: Self-pay | Admitting: Internal Medicine

## 2023-02-16 ENCOUNTER — Telehealth: Payer: Self-pay

## 2023-02-16 DIAGNOSIS — E785 Hyperlipidemia, unspecified: Secondary | ICD-10-CM

## 2023-02-16 NOTE — Transitions of Care (Post Inpatient/ED Visit) (Signed)
   02/16/2023  Name: Rhonda [name] MRN: [id number] DOB: [birth year]  Today's TOC FU Call Status: Today's TOC FU Call Status:: Unsuccessul Call (1st Attempt) Unsuccessful Call (1st Attempt) Date: 02/16/23  Red on EMMI-ED Discharge Alert Date & Reason:02/13/23 "Have d/c instructions? No"  Attempted to reach the patient regarding the most recent Inpatient/ED visit.  Follow Up Plan: Additional outreach attempts will be made to reach the patient to complete the Transitions of Care (Post Inpatient/ED visit) call.   Antionette Fairy, RN,BSN,CCM [facility] Care Management Care Management Community Coordinator Direct Phone: [phone] Toll Free: [phone] Fax: [phone]

## 2023-02-17 ENCOUNTER — Telehealth: Payer: Self-pay

## 2023-02-17 NOTE — Transitions of Care (Post Inpatient/ED Visit) (Signed)
02/17/2023  Name: Rhonda [name] MRN: [id number] DOB: [birth year]  Today's TOC FU Call Status: Today's TOC FU Call Status:: Successful TOC FU Call Competed TOC FU Call Complete Date: 02/17/23  Red on EMMI-ED Discharge Alert Date & Reason:02/13/23 "Scheduled follow-up appt? No"   Transition Care Management Follow-up Telephone Call Date of Discharge: 02/12/23 Discharge Facility: [facility]) Type of Discharge: Emergency Department Reason for ED Visit: Other: ("precordial chest pain") How have you been since you were released from the hospital?: Better (pt reports she is doing better-still has "pain off and on-comes & goes and relieved with rest") Any questions or concerns?: No  Items Reviewed: Did you receive and understand the discharge instructions provided?: Yes Medications obtained,verified, and reconciled?: Yes (Medications Reviewed) Any new allergies since your discharge?: No Dietary orders reviewed?: Yes Type of Diet Ordered:: low salt/heart healthy/carb mdofiied Do you have support at home?: Yes People in Home: spouse Name of Support/Comfort Primary Source: Darren  Medications Reviewed Today: Medications Reviewed Today     Reviewed by Charlyn Minerva, RN (Registered Nurse) on 02/17/23 at 1012  Med List Status: <None>   Medication Order Taking? Sig Documenting Provider Last Dose Status Informant  albuterol (VENTOLIN HFA) 108 (90 Base) MCG/ACT inhaler [id number] Yes Inhale 2 puffs into the lungs every 6 (six) hours as needed. Philip Aspen, Limmie Patricia, MD Taking Active Self, Pharmacy Records  ALPRAZolam Prudy Feeler) 0.25 MG tablet [id number] Yes Take 1 tablet (0.25 mg total) by mouth 2 (two) times daily as needed for anxiety. Philip Aspen, Limmie Patricia, MD Taking Active Self, Pharmacy Records  apixaban [facility]) 5 MG TABS tablet [id number] Yes Take 1 tablet (5 mg total) by mouth 2 (two) times daily. Philip Aspen, Limmie Patricia, MD Taking Active Self, Pharmacy  Records  atorvastatin (LIPITOR) 40 MG tablet [id number] Yes TAKE 1 TABLET(40 MG) BY MOUTH DAILY Philip Aspen, Limmie Patricia, MD Taking Active   azithromycin (ZITHROMAX) 250 MG tablet [id number] Yes Take 1 tablet (250 mg total) by mouth daily. Take first 2 tablets together, then 1 every day until finished. Maia Plan, MD Taking Active   benazepril-hydrochlorthiazide (LOTENSIN HCT) 20-25 MG tablet [id number] Yes TAKE 1 TABLET BY MOUTH DAILY Philip Aspen, Limmie Patricia, MD Taking Active Self, Pharmacy Records  benzonatate (TESSALON) 100 MG capsule [id number] No Take 1 capsule (100 mg total) by mouth 3 (three) times daily as needed for cough.  Patient not taking: Reported on 02/17/2023   Maia Plan, MD Not Taking Active   cetirizine (ZYRTEC) 10 MG tablet [id number] Yes Take 10 mg by mouth daily. [provider] Taking Active Self, Pharmacy Records  docusate sodium (COLACE) 100 MG capsule [id number] Yes Take 100 mg by mouth daily. [provider] Taking Active Self, Pharmacy Records  esomeprazole (NEXIUM) 40 MG capsule [id number] Yes TAKE ONE CAPSULE BY MOUTH EVERY DAY AT 12 NOON.  Patient taking differently: Take 40 mg by mouth daily at 12 noon.   Philip Aspen, Limmie Patricia, MD Taking Active Self, Pharmacy Records  glucose blood [facility] ULTRA) test strip [id number]  Use to check glucose 1 time daily Philip Aspen, Limmie Patricia, MD  Active Self, Pharmacy Records  Lancet Devices [facility] PLUS LANCING) MISC [id number]  1 each by Does not apply route daily. Philip Aspen, Limmie Patricia, MD  Active Self, Pharmacy Records  Lancets [facility] [place] PLUS [place]) [place] [id number]  USE AS DIRECTED ONCE DAILY Philip Aspen, Limmie Patricia, MD  Active Self, Pharmacy Records  levothyroxine (SYNTHROID) 100 MCG tablet  [id number] Yes TAKE 1 TABLET(100 MCG) BY MOUTH DAILY BEFORE Woodroe Mode, MD Taking Active Self, Pharmacy Records  ondansetron [facility]) 8 MG tablet [id number]  Take 1 tablet (8 mg  total) by mouth every 8 (eight) hours as needed for nausea or vomiting. Sherrilyn Rist, MD  Active Self, Pharmacy Records  oxyCODONE (ROXICODONE) 5 MG immediate release tablet [id number]  Take 1 tablet (5 mg total) by mouth every 6 (six) hours as needed for severe pain. Teressa Lower, PA-C  Active Self, Pharmacy Records  Peppermint Oil (IBGARD PO) [id number] Yes Take 1 tablet by mouth daily. [provider] Taking Active Self, Pharmacy Records            Home Care and Equipment/Supplies: Were Home Health Services Ordered?: NA Any new equipment or medical supplies ordered?: NA  Functional Questionnaire: Do you need assistance with bathing/showering or dressing?: No Do you need assistance with meal preparation?: No Do you need assistance with eating?: No Do you have difficulty maintaining continence: No Do you need assistance with getting out of bed/getting out of a chair/moving?: No Do you have difficulty managing or taking your medications?: No  Follow up appointments reviewed: PCP Follow-up appointment confirmed?: NA (pt states she just saw PCP on 01/19/23) Specialist Hospital Follow-up appointment confirmed?: No Reason Specialist Follow-Up Not Confirmed: Patient has Specialist Provider Number and will Call for Appointment (Pt will call and make cardiology follow up appt) Do you need transportation to your follow-up appointment?: No Do you understand care options if your condition(s) worsen?: Yes-patient verbalized understanding  SDOH Interventions Today    Flowsheet Row Most Recent Value  SDOH Interventions   Food Insecurity Interventions Intervention Not Indicated  Transportation Interventions Intervention Not Indicated       TOC Interventions Today    Flowsheet Row Most Recent Value  TOC Interventions   TOC Interventions Discussed/Reviewed TOC Interventions Discussed      Interventions Today    Flowsheet Row Most Recent Value  General Interventions    General Interventions Discussed/Reviewed General Interventions Discussed, Doctor Visits  Doctor Visits Discussed/Reviewed Doctor Visits Discussed, Specialist, PCP  PCP/Specialist Visits Compliance with follow-up visit  Education Interventions   Education Provided Provided Education  Provided Verbal Education On Nutrition, When to see the doctor, Medication, Other  [cardiac mgmt]  Nutrition Interventions   Nutrition Discussed/Reviewed Nutrition Discussed  Pharmacy Interventions   Pharmacy Dicussed/Reviewed Pharmacy Topics Discussed, Medications and their functions        Alessandra Grout [facility] Care Management Care Management Community Coordinator Direct Phone: [phone] Toll Free: [phone] Fax: [phone]

## 2023-02-25 ENCOUNTER — Encounter: Payer: Self-pay | Admitting: Internal Medicine

## 2023-02-25 ENCOUNTER — Other Ambulatory Visit: Payer: Self-pay | Admitting: Internal Medicine

## 2023-02-25 DIAGNOSIS — M545 Low back pain, unspecified: Secondary | ICD-10-CM

## 2023-02-25 MED ORDER — PREDNISONE 10 MG (21) PO TBPK
ORAL_TABLET | ORAL | 0 refills | Status: DC
Start: 2023-02-25 — End: 2023-04-14

## 2023-03-12 ENCOUNTER — Other Ambulatory Visit: Payer: Self-pay | Admitting: Internal Medicine

## 2023-03-17 ENCOUNTER — Encounter: Payer: Self-pay | Admitting: Internal Medicine

## 2023-04-07 ENCOUNTER — Other Ambulatory Visit: Payer: Self-pay | Admitting: Endocrinology

## 2023-04-07 DIAGNOSIS — E890 Postprocedural hypothyroidism: Secondary | ICD-10-CM

## 2023-04-14 ENCOUNTER — Encounter: Payer: Self-pay | Admitting: Internal Medicine

## 2023-04-14 ENCOUNTER — Ambulatory Visit (INDEPENDENT_AMBULATORY_CARE_PROVIDER_SITE_OTHER): Payer: Self-pay | Admitting: Internal Medicine

## 2023-04-14 VITALS — BP 120/84 | HR 66 | Temp 98.2°F | Wt 193.4 lb

## 2023-04-14 DIAGNOSIS — B3731 Acute candidiasis of vulva and vagina: Secondary | ICD-10-CM

## 2023-04-14 DIAGNOSIS — E538 Deficiency of other specified B group vitamins: Secondary | ICD-10-CM

## 2023-04-14 DIAGNOSIS — Z23 Encounter for immunization: Secondary | ICD-10-CM

## 2023-04-14 LAB — VITAMIN B12: Vitamin B-12: 307 pg/mL (ref 211–911)

## 2023-04-14 MED ORDER — FLUCONAZOLE 150 MG PO TABS
150.0000 mg | ORAL_TABLET | Freq: Once | ORAL | 0 refills | Status: AC
Start: 2023-04-14 — End: 2023-04-14

## 2023-04-14 NOTE — Progress Notes (Signed)
Established Patient Office Visit     CC/Reason for Visit: Discuss acute concern  HPI: Rhonda [name] is a 52 y.o. female who is coming in today for the above mentioned reasons.  For the past few days she has been experiencing significant vaginal itching with what she describes as a thick, white discharge.  She would also like to check her B12 levels.  Requesting flu vaccine.   Past Medical/Surgical History: Past Medical History:  Diagnosis Date   Anxiety    C1q nephropathy    Cancer (HCC)    history of thyroid cancer   Chronic constipation    CKD (chronic kidney disease), stage II nephrologist-  dr Vern [facility])   secondary to C1q nephropathy/  hypertension   Family history of adverse reaction to anesthesia    father hard to put down for anesthesia   GERD (gastroesophageal reflux disease)    H/O radioactive iodine thyroid ablation    1992  right throid   History of exercise intolerance    01-052011  ETT--  normal w/ no ischemia per cardiologist (dr Shirlee Latch) epic note   History of pulmonary embolus (PE)    07/ 2008-- treated w/ coumadin   History of syncope    recurrent --  workup done per epic -- dx orthostatic hypotension   Hyperlipidemia    Hypertension    IBS (irritable bowel syndrome)    Neuromuscular disorder (HCC)    carpal tunnel   Pelvic pain in female    Post-surgical hypothyroidism    1991 left thyroidectomy   Pre-diabetes    Proteinuria    Seasonal and perennial allergic rhinitis    Wears contact lenses    Wears glasses     Past Surgical History:  Procedure Laterality Date   64 HOUR PH STUDY N/A 11/16/2016   Procedure: 24 HOUR PH STUDY;  Surgeon: Sherrilyn Rist, MD;  Location: [facility];  Service: Gastroenterology;  Laterality: N/A;   ABDOMINAL HYSTERECTOMY  1998   COLONOSCOPY     COLONOSCOPY WITH PROPOFOL N/A 08/05/2021   Procedure: COLONOSCOPY WITH PROPOFOL;  Surgeon: Imogene Burn, MD;   Location: [facility];  Service: Gastroenterology;  Laterality: N/A;   CYSTOSCOPY N/A 04/28/2016   Procedure: CYSTOSCOPY;  Surgeon: Richardean Chimera, MD;  Location: [facility];  Service: Gynecology;  Laterality: N/A;   ESOPHAGEAL MANOMETRY N/A 11/16/2016   Procedure: ESOPHAGEAL MANOMETRY (EM);  Surgeon: Sherrilyn Rist, MD;  Location: [facility];  Service: Gastroenterology;  Laterality: N/A;   ESOPHAGOGASTRODUODENOSCOPY     LAPAROSCOPIC CHOLECYSTECTOMY  1997   LAPAROSCOPY N/A 03/26/2016   Procedure: LAPAROSCOPY DIAGNOSTIC, LYSIS OF ADHESIONS;  Surgeon: Richardean Chimera, MD;  Location: [facility];  Service: Gynecology;  Laterality: N/A;   LAPAROTOMY N/A 04/28/2016   Procedure: EXPLORATORY LAPAROTOMY;  Surgeon: Richardean Chimera, MD;  Location: [facility];  Service: Gynecology;  Laterality: N/A;   RENAL BIOPSY     SALPINGOOPHORECTOMY Bilateral 04/28/2016   Procedure: Bilateral OOPHORECTOMY;  Surgeon: Richardean Chimera, MD;  Location: [facility];  Service: Gynecology;  Laterality: Bilateral;   THYROIDECTOMY Left 1991   TRANSTHORACIC ECHOCARDIOGRAM  04/27/2012   normal LV, ef 60-65%/  trivial MR   TUBAL LIGATION  1992    Social History:  reports that she has never smoked. She has never used smokeless tobacco. She reports that she does not drink alcohol and does not use drugs.  Allergies: Allergies  Allergen Reactions   Aspirin Hives  Flagyl [Metronidazole] Nausea And Vomiting    With oral flagyl.   Has tolerated vaginal flagyl.     Food Other (See Comments)    Lima beans--  Positive allergy test    Tramadol Other (See Comments)    Headache    Dilaudid [Hydromorphone] Itching    Family History:  Family History  Problem Relation Age of Onset   Diabetes Mother        Pre-diabetic   Hypertension Mother    Hypertension Father    Coronary artery disease Father    Diabetes Father    Stroke Father    Prostate cancer Father    Breast cancer Maternal Aunt    Thyroid disease Maternal Aunt     Colon cancer Neg Hx    Colon polyps Neg Hx    Kidney disease Neg Hx    Esophageal cancer Neg Hx    Gallbladder disease Neg Hx    Stomach cancer Neg Hx    Rectal cancer Neg Hx      Current Outpatient Medications:    albuterol (VENTOLIN HFA) 108 (90 Base) MCG/ACT inhaler, Inhale 2 puffs into the lungs every 6 (six) hours as needed., Disp: 6.7 g, Rfl: 1   ALPRAZolam (XANAX) 0.25 MG tablet, Take 1 tablet (0.25 mg total) by mouth 2 (two) times daily as needed for anxiety., Disp: 60 tablet, Rfl: 0   apixaban (ELIQUIS) 5 MG TABS tablet, Take 1 tablet (5 mg total) by mouth 2 (two) times daily., Disp: 56 tablet, Rfl: 0   atorvastatin (LIPITOR) 40 MG tablet, TAKE 1 TABLET(40 MG) BY MOUTH DAILY, Disp: 90 tablet, Rfl: 0   benazepril-hydrochlorthiazide (LOTENSIN HCT) 20-25 MG tablet, TAKE 1 TABLET BY MOUTH DAILY, Disp: 90 tablet, Rfl: 1   cetirizine (ZYRTEC) 10 MG tablet, Take 10 mg by mouth daily., Disp: , Rfl:    docusate sodium (COLACE) 100 MG capsule, Take 100 mg by mouth daily., Disp: , Rfl:    esomeprazole (NEXIUM) 40 MG capsule, TAKE ONE CAPSULE BY MOUTH EVERY DAY AT 12 NOON. (Patient taking differently: Take 40 mg by mouth daily at 12 noon.), Disp: 90 capsule, Rfl: 1   fluconazole (DIFLUCAN) 150 MG tablet, Take 1 tablet (150 mg total) by mouth once for 1 dose., Disp: 1 tablet, Rfl: 0   glucose blood (ONETOUCH ULTRA) test strip, CHECK GLUCOSE ONCE DAILY, Disp: 100 strip, Rfl: 3   Lancet Devices (ONETOUCH DELICA PLUS LANCING) MISC, 1 each by Does not apply route daily., Disp: 100 each, Rfl: 12   Lancets (ONETOUCH DELICA PLUS LANCET33G) MISC, USE AS DIRECTED ONCE DAILY, Disp: 100 each, Rfl: 3   levothyroxine (SYNTHROID) 100 MCG tablet, TAKE 1 TABLET(100 MCG) BY MOUTH DAILY BEFORE BREAKFAST, Disp: 90 tablet, Rfl: 3   ondansetron (ZOFRAN) 8 MG tablet, Take 1 tablet (8 mg total) by mouth every 8 (eight) hours as needed for nausea or vomiting., Disp: 30 tablet, Rfl: 0   Peppermint Oil (IBGARD PO), Take  1 tablet by mouth daily., Disp: , Rfl:   Review of Systems:  Negative unless indicated in HPI.   Physical Exam: Vitals:   04/14/23 0824  BP: 120/84  Pulse: 66  Temp: 98.2 F (36.8 C)  TempSrc: Oral  SpO2: 98%  Weight: 193 lb 6.4 oz (87.7 kg)    Body mass index is 32.18 kg/m.   Physical Exam Vitals reviewed.  Constitutional:      Appearance: Normal appearance.  HENT:     Head: Normocephalic and atraumatic.  Eyes:  Conjunctiva/sclera: Conjunctivae normal.     Pupils: Pupils are equal, round, and reactive to light.  Skin:    General: Skin is warm and dry.  Neurological:     General: No focal deficit present.     Mental Status: She is alert and oriented to person, place, and time.  Psychiatric:        Mood and Affect: Mood normal.        Behavior: Behavior normal.        Thought Content: Thought content normal.        Judgment: Judgment normal.      Impression and Plan:  Vaginal yeast infection -     Fluconazole; Take 1 tablet (150 mg total) by mouth once for 1 dose.  Dispense: 1 tablet; Refill: 0  Needs flu shot -     Flu vaccine trivalent PF, 6mos and older(Flulaval,Afluria,Fluarix,Fluzone)  B12 deficiency -     Vitamin B12; Future   -Flu vaccine administered in office today. -Fluconazole 150 mg x 1 for vaginal yeast.  Time spent:22 minutes reviewing chart, interviewing and examining patient and formulating plan of care.     Chaya Jan, MD [facility]

## 2023-04-28 ENCOUNTER — Other Ambulatory Visit: Payer: Self-pay | Admitting: Internal Medicine

## 2023-04-28 ENCOUNTER — Encounter: Payer: Self-pay | Admitting: Internal Medicine

## 2023-04-28 DIAGNOSIS — M545 Low back pain, unspecified: Secondary | ICD-10-CM

## 2023-04-28 MED ORDER — PREDNISONE 10 MG (21) PO TBPK
ORAL_TABLET | ORAL | 0 refills | Status: DC
Start: 2023-04-28 — End: 2023-07-12

## 2023-04-30 ENCOUNTER — Ambulatory Visit (HOSPITAL_BASED_OUTPATIENT_CLINIC_OR_DEPARTMENT_OTHER): Payer: Self-pay | Admitting: Cardiology

## 2023-05-15 ENCOUNTER — Other Ambulatory Visit: Payer: Self-pay | Admitting: Internal Medicine

## 2023-05-15 DIAGNOSIS — E785 Hyperlipidemia, unspecified: Secondary | ICD-10-CM

## 2023-05-26 ENCOUNTER — Other Ambulatory Visit: Payer: Self-pay | Admitting: Internal Medicine

## 2023-06-14 ENCOUNTER — Encounter: Payer: Self-pay | Admitting: Internal Medicine

## 2023-06-14 ENCOUNTER — Other Ambulatory Visit: Payer: Self-pay | Admitting: Internal Medicine

## 2023-06-14 DIAGNOSIS — M545 Low back pain, unspecified: Secondary | ICD-10-CM

## 2023-06-23 ENCOUNTER — Other Ambulatory Visit: Payer: Self-pay | Admitting: *Deleted

## 2023-06-23 MED ORDER — APIXABAN 5 MG PO TABS: 5.0000 mg | ORAL_TABLET | Freq: Two times a day (BID) | ORAL | 1 refills | Status: DC

## 2023-06-25 ENCOUNTER — Encounter: Payer: Self-pay | Admitting: Adult Health

## 2023-06-25 ENCOUNTER — Ambulatory Visit (INDEPENDENT_AMBULATORY_CARE_PROVIDER_SITE_OTHER): Payer: BC Managed Care – PPO | Admitting: Adult Health

## 2023-06-25 VITALS — BP 100/80 | HR 76 | Temp 99.1°F | Ht 65.0 in | Wt 189.0 lb

## 2023-06-25 DIAGNOSIS — R110 Nausea: Secondary | ICD-10-CM

## 2023-06-25 DIAGNOSIS — H8113 Benign paroxysmal vertigo, bilateral: Secondary | ICD-10-CM

## 2023-06-25 MED ORDER — MECLIZINE HCL 50 MG PO TABS
50.0000 mg | ORAL_TABLET | Freq: Three times a day (TID) | ORAL | 0 refills | Status: DC | PRN
Start: 2023-06-25 — End: 2024-04-06

## 2023-06-25 MED ORDER — ONDANSETRON HCL 4 MG PO TABS: 4.0000 mg | ORAL_TABLET | Freq: Three times a day (TID) | ORAL | 0 refills | Status: DC | PRN

## 2023-06-25 NOTE — Patient Instructions (Addendum)
Your exam is consistent with Vertigo.   I have sent in a medication called Meclizine   I have also sent in a medication called Zofran to help with the nausea  You can check out videos of the Epley Maneuver on You Tube as well

## 2023-06-25 NOTE — Progress Notes (Addendum)
Subjective:    Patient ID: Rhonda [name], female    DOB: July 04, 1971, 52 y.o.   MRN: [id number]  Dizziness    52 year old female who  has a past medical history of Anxiety, C1q nephropathy, Cancer (HCC), Chronic constipation, CKD (chronic kidney disease), stage II (nephrologist-  dr Vern Claude [facility])), Family history of adverse reaction to anesthesia, GERD (gastroesophageal reflux disease), H/O radioactive iodine thyroid ablation, History of exercise intolerance, History of pulmonary embolus (PE), History of syncope, Hyperlipidemia, Hypertension, IBS (irritable bowel syndrome), Neuromuscular disorder (HCC), Pelvic pain in female, Post-surgical hypothyroidism, Pre-diabetes, Proteinuria, Seasonal and perennial allergic rhinitis, Wears contact lenses, and Wears glasses.  She presents to the office today for an acute issue. Her symptoms started about a week ago. She reports that whenever she bends over and stands back up she gets dizzy and feels like the world is spinning around here. She has associated nausea without vomiting. Her symptoms last a few minutes. She has not experienced this when getting out of bed in the morning or when standing up out of a chair.   She does stay hydrated.   Has not had any fevers or chills.  No CVA like symptoms   Review of Systems  Neurological:  Positive for dizziness.   See HPI   Past Medical History:  Diagnosis Date   Anxiety    C1q nephropathy    Cancer (HCC)    history of thyroid cancer   Chronic constipation    CKD (chronic kidney disease), stage II nephrologist-  dr Vern Claude [facility])   secondary to C1q nephropathy/  hypertension   Family history of adverse reaction to anesthesia    father hard to put down for anesthesia   GERD (gastroesophageal reflux disease)    H/O radioactive iodine thyroid ablation    1992  right throid   History of exercise intolerance    [id number]   ETT--  normal w/ no ischemia per cardiologist (dr Shirlee Latch) epic note   History of pulmonary embolus (PE)    07/ 2008-- treated w/ coumadin   History of syncope    recurrent --  workup done per epic -- dx orthostatic hypotension   Hyperlipidemia    Hypertension    IBS (irritable bowel syndrome)    Neuromuscular disorder (HCC)    carpal tunnel   Pelvic pain in female    Post-surgical hypothyroidism    1991 left thyroidectomy   Pre-diabetes    Proteinuria    Seasonal and perennial allergic rhinitis    Wears contact lenses    Wears glasses     Social History   Socioeconomic History   Marital status: Married    Spouse name: Not on file   Number of children: 2   Years of education: Not on file   Highest education level: GED or equivalent  Occupational History   Occupation: NURSING ASSISTANT    Employer: [employer]  Tobacco Use   Smoking status: Never   Smokeless tobacco: Never  Vaping Use   Vaping status: Never Used  Substance and Sexual Activity   Alcohol use: No    Alcohol/week: 0.0 standard drinks of alcohol   Drug use: No   Sexual activity: Yes    Birth control/protection: Surgical  Other Topics Concern   Not on file  Social History Narrative   Not on file   Social Determinants of Health   Financial Resource Strain: Low Risk  (06/24/2023)   Overall Financial  Resource Strain (CARDIA)    Difficulty of Paying Living Expenses: Not hard at all  Food Insecurity: No Food Insecurity (06/24/2023)   Hunger Vital Sign    Worried About Running Out of Food in the Last Year: Never true    Ran Out of Food in the Last Year: Never true  Transportation Needs: No Transportation Needs (06/24/2023)   PRAPARE - Administrator, Civil Service (Medical): No    Lack of Transportation (Non-Medical): No  Physical Activity: Insufficiently Active (06/24/2023)   Exercise Vital Sign    Days of Exercise per Week: 3 days    Minutes of Exercise per Session: 20 min  Stress:  Stress Concern Present (06/24/2023)   [employer] of Occupational Health - Occupational Stress Questionnaire    Feeling of Stress : To some extent  Social Connections: Moderately Integrated (06/24/2023)   Social Connection and Isolation Panel [NHANES]    Frequency of Communication with Friends and Family: More than three times a week    Frequency of Social Gatherings with Friends and Family: Once a week    Attends Religious Services: More than 4 times per year    Active Member of [employer] or Organizations: No    Attends Engineer, structural: Not on file    Marital Status: Married  Catering manager Violence: Not on file    Past Surgical History:  Procedure Laterality Date   24 HOUR PH STUDY N/A 11/16/2016   Procedure: 24 HOUR PH STUDY;  Surgeon: Sherrilyn Rist, MD;  Location: [facility];  Service: Gastroenterology;  Laterality: N/A;   ABDOMINAL HYSTERECTOMY  1998   COLONOSCOPY     COLONOSCOPY WITH PROPOFOL N/A 08/05/2021   Procedure: COLONOSCOPY WITH PROPOFOL;  Surgeon: Imogene Burn, MD;  Location: [facility];  Service: Gastroenterology;  Laterality: N/A;   CYSTOSCOPY N/A 04/28/2016   Procedure: CYSTOSCOPY;  Surgeon: Richardean Chimera, MD;  Location: [facility];  Service: Gynecology;  Laterality: N/A;   ESOPHAGEAL MANOMETRY N/A 11/16/2016   Procedure: ESOPHAGEAL MANOMETRY (EM);  Surgeon: Sherrilyn Rist, MD;  Location: [facility];  Service: Gastroenterology;  Laterality: N/A;   ESOPHAGOGASTRODUODENOSCOPY     LAPAROSCOPIC CHOLECYSTECTOMY  1997   LAPAROSCOPY N/A 03/26/2016   Procedure: LAPAROSCOPY DIAGNOSTIC, LYSIS OF ADHESIONS;  Surgeon: Richardean Chimera, MD;  Location[facility];  Service: Gynecology;  Laterality: N/A;   LAPAROTOMY N/A 04/28/2016   Procedure: EXPLORATORY LAPAROTOMY;  Surgeon: Richardean Chimera, MD;  Location: [facility];  Service: Gynecology;  Laterality: N/A;   RENAL BIOPSY     SALPINGOOPHORECTOMY Bilateral 04/28/2016   Procedure: Bilateral  OOPHORECTOMY;  Surgeon: Richardean Chimera, MD;  Location: [facility];  Service: Gynecology;  Laterality: Bilateral;   THYROIDECTOMY Left 1991   TRANSTHORACIC ECHOCARDIOGRAM  04/27/2012   normal LV, ef 60-65%/  trivial MR   TUBAL LIGATION  1992    Family History  Problem Relation Age of Onset   Diabetes Mother        Pre-diabetic   Hypertension Mother    Hypertension Father    Coronary artery disease Father    Diabetes Father    Stroke Father    Prostate cancer Father    Breast cancer Maternal Aunt    Thyroid disease Maternal Aunt    Colon cancer Neg Hx    Colon polyps Neg Hx    Kidney disease Neg Hx    Esophageal cancer Neg Hx    Gallbladder disease Neg Hx    Stomach cancer  Neg Hx    Rectal cancer Neg Hx     Allergies  Allergen Reactions   Aspirin Hives   Flagyl [Metronidazole] Nausea And Vomiting    With oral flagyl.   Has tolerated vaginal flagyl.     Food Other (See Comments)    Lima beans--  Positive allergy test    Tramadol Other (See Comments)    Headache    Dilaudid [Hydromorphone] Itching    Current Outpatient Medications on File Prior to Visit  Medication Sig Dispense Refill   albuterol (VENTOLIN HFA) 108 (90 Base) MCG/ACT inhaler Inhale 2 puffs into the lungs every 6 (six) hours as needed. 6.7 g 1   ALPRAZolam (XANAX) 0.25 MG tablet Take 1 tablet (0.25 mg total) by mouth 2 (two) times daily as needed for anxiety. 60 tablet 0   apixaban (ELIQUIS) 5 MG TABS tablet Take 1 tablet (5 mg total) by mouth 2 (two) times daily. 180 tablet 1   atorvastatin (LIPITOR) 40 MG tablet TAKE 1 TABLET(40 MG) BY MOUTH DAILY 90 tablet 0   benazepril-hydrochlorthiazide (LOTENSIN HCT) 20-25 MG tablet TAKE 1 TABLET BY MOUTH DAILY 90 tablet 1   cetirizine (ZYRTEC) 10 MG tablet Take 10 mg by mouth daily.     docusate sodium (COLACE) 100 MG capsule Take 100 mg by mouth daily.     esomeprazole (NEXIUM) 40 MG capsule TAKE ONE CAPSULE BY MOUTH EVERY DAY AT 12 NOON. 90 capsule 1   glucose blood  (ONETOUCH ULTRA) test strip CHECK GLUCOSE ONCE DAILY 100 strip 3   Lancet Devices (ONETOUCH DELICA PLUS LANCING) MISC 1 each by Does not apply route daily. 100 each 12   Lancets (ONETOUCH DELICA PLUS LANCET33G) MISC USE AS DIRECTED ONCE DAILY 100 each 3   levothyroxine (SYNTHROID) 100 MCG tablet TAKE 1 TABLET(100 MCG) BY MOUTH DAILY BEFORE BREAKFAST 90 tablet 3   ondansetron (ZOFRAN) 8 MG tablet Take 1 tablet (8 mg total) by mouth every 8 (eight) hours as needed for nausea or vomiting. 30 tablet 0   Peppermint Oil (IBGARD PO) Take 1 tablet by mouth daily.     predniSONE (STERAPRED UNI-PAK 21 TAB) 10 MG (21) TBPK tablet Take as directed 21 tablet 0   No current facility-administered medications on file prior to visit.    BP 100/80   Pulse 76   Temp 99.1 F (37.3 C) (Oral)   Ht 5\' 5"  (1.651 m)   Wt 189 lb (85.7 kg)   SpO2 100%   BMI 31.45 kg/m       Objective:   Physical Exam Vitals and nursing note reviewed.  Constitutional:      Appearance: Normal appearance.  Eyes:     Extraocular Movements:     Right eye: Nystagmus (horizonal) present.     Left eye: Nystagmus (horizontal) present.     Comments: Symptomatic in exam room.   Cardiovascular:     Rate and Rhythm: Normal rate and regular rhythm.     Pulses: Normal pulses.     Heart sounds: Normal heart sounds.  Pulmonary:     Effort: Pulmonary effort is normal.     Breath sounds: Normal breath sounds.  Musculoskeletal:        General: Normal range of motion.  Skin:    General: Skin is warm and dry.  Neurological:     General: No focal deficit present.     Mental Status: She is alert and oriented to person, place, and time.     Cranial  Nerves: Cranial nerves 2-12 are intact.     Sensory: Sensation is intact.     Motor: Motor function is intact.     Coordination: Coordination is intact.     Gait: Gait is intact.  Psychiatric:        Mood and Affect: Mood normal.        Behavior: Behavior normal.        Thought  Content: Thought content normal.        Judgment: Judgment normal.       Assessment & Plan:  1. Benign paroxysmal positional vertigo due to bilateral vestibular disorder - Symptoms consistent with BPPV. No signs of CVA.  - Will prescribe Meclizine.  - She can watch videos of Epley Maneuver on YouTube if symptoms do not improve with medication  - Follow up as needed - meclizine (ANTIVERT) 50 MG tablet; Take 1 tablet (50 mg total) by mouth 3 (three) times daily as needed.  Dispense: 30 tablet; Refill: 0  2. Nausea  - ondansetron (ZOFRAN) 4 MG tablet; Take 1 tablet (4 mg total) by mouth every 8 (eight) hours as needed for nausea or vomiting.  Dispense: 20 tablet; Refill: 0  Shirline Frees, NP

## 2023-06-28 ENCOUNTER — Other Ambulatory Visit: Payer: Self-pay | Admitting: Internal Medicine

## 2023-06-29 ENCOUNTER — Other Ambulatory Visit: Payer: Self-pay | Admitting: Internal Medicine

## 2023-06-29 DIAGNOSIS — Z1231 Encounter for screening mammogram for malignant neoplasm of breast: Secondary | ICD-10-CM

## 2023-07-02 ENCOUNTER — Encounter (HOSPITAL_COMMUNITY): Payer: Self-pay | Admitting: Emergency Medicine

## 2023-07-02 ENCOUNTER — Other Ambulatory Visit: Payer: Self-pay

## 2023-07-02 ENCOUNTER — Emergency Department (HOSPITAL_COMMUNITY)
Admission: EM | Admit: 2023-07-02 | Discharge: 2023-07-03 | Disposition: A | Discharge status: Home / Self Care | Payer: BC Managed Care – PPO | Attending: Student | Admitting: Student

## 2023-07-02 ENCOUNTER — Emergency Department (HOSPITAL_COMMUNITY): Payer: BC Managed Care – PPO

## 2023-07-02 DIAGNOSIS — Z79899 Other long term (current) drug therapy: Secondary | ICD-10-CM | POA: Diagnosis not present

## 2023-07-02 DIAGNOSIS — Z85850 Personal history of malignant neoplasm of thyroid: Secondary | ICD-10-CM | POA: Diagnosis not present

## 2023-07-02 DIAGNOSIS — M549 Dorsalgia, unspecified: Secondary | ICD-10-CM | POA: Diagnosis not present

## 2023-07-02 DIAGNOSIS — Z7901 Long term (current) use of anticoagulants: Secondary | ICD-10-CM | POA: Insufficient documentation

## 2023-07-02 DIAGNOSIS — R918 Other nonspecific abnormal finding of lung field: Secondary | ICD-10-CM | POA: Diagnosis not present

## 2023-07-02 DIAGNOSIS — I129 Hypertensive chronic kidney disease with stage 1 through stage 4 chronic kidney disease, or unspecified chronic kidney disease: Secondary | ICD-10-CM | POA: Diagnosis not present

## 2023-07-02 DIAGNOSIS — Q245 Malformation of coronary vessels: Secondary | ICD-10-CM | POA: Diagnosis not present

## 2023-07-02 DIAGNOSIS — R0789 Other chest pain: Secondary | ICD-10-CM | POA: Diagnosis not present

## 2023-07-02 DIAGNOSIS — N183 Chronic kidney disease, stage 3 unspecified: Secondary | ICD-10-CM | POA: Diagnosis not present

## 2023-07-02 DIAGNOSIS — E119 Type 2 diabetes mellitus without complications: Secondary | ICD-10-CM | POA: Diagnosis not present

## 2023-07-02 DIAGNOSIS — R079 Chest pain, unspecified: Secondary | ICD-10-CM | POA: Diagnosis not present

## 2023-07-02 DIAGNOSIS — I10 Essential (primary) hypertension: Secondary | ICD-10-CM | POA: Diagnosis not present

## 2023-07-02 DIAGNOSIS — M546 Pain in thoracic spine: Secondary | ICD-10-CM | POA: Insufficient documentation

## 2023-07-02 LAB — BASIC METABOLIC PANEL
Anion gap: 8 (ref 5–15)
BUN: 30 mg/dL — ABNORMAL HIGH (ref 6–20)
CO2: 26 mmol/L (ref 22–32)
Calcium: 10.5 mg/dL — ABNORMAL HIGH (ref 8.9–10.3)
Chloride: 106 mmol/L (ref 98–111)
Creatinine, Ser: 1.62 mg/dL — ABNORMAL HIGH (ref 0.44–1.00)
GFR, Estimated: 38 mL/min — ABNORMAL LOW (ref >60)
Glucose, Bld: 96 mg/dL (ref 70–99)
Potassium: 4 mmol/L (ref 3.5–5.1)
Sodium: 140 mmol/L (ref 135–145)

## 2023-07-02 LAB — CBC
HCT: 38.9 % (ref 36.0–46.0)
Hemoglobin: 12.4 g/dL (ref 12.0–15.0)
MCH: 28.4 pg (ref 26.0–34.0)
MCHC: 31.9 g/dL (ref 30.0–36.0)
MCV: 89 fL (ref 80.0–100.0)
Platelets: 231 10*3/uL (ref 150–400)
RBC: 4.37 MIL/uL (ref 3.87–5.11)
RDW: 13.1 % (ref 11.5–15.5)
WBC: 6.4 10*3/uL (ref 4.0–10.5)
nRBC: 0 % (ref 0.0–0.2)

## 2023-07-02 LAB — TROPONIN I (HIGH SENSITIVITY)
Troponin I (High Sensitivity): 4 ng/L (ref <18)
Troponin I (High Sensitivity): 3 ng/L (ref <18)

## 2023-07-02 NOTE — ED Triage Notes (Signed)
Patient arrives ambulatory by POV c/o central chest pain and back pain that woke her from her sleep around 5 am this morning. Describes it as heaviness and sharpness.

## 2023-07-03 ENCOUNTER — Emergency Department (HOSPITAL_COMMUNITY): Payer: BC Managed Care – PPO

## 2023-07-03 DIAGNOSIS — R918 Other nonspecific abnormal finding of lung field: Secondary | ICD-10-CM | POA: Diagnosis not present

## 2023-07-03 DIAGNOSIS — M549 Dorsalgia, unspecified: Secondary | ICD-10-CM | POA: Diagnosis not present

## 2023-07-03 DIAGNOSIS — R0789 Other chest pain: Secondary | ICD-10-CM | POA: Diagnosis not present

## 2023-07-03 DIAGNOSIS — Q245 Malformation of coronary vessels: Secondary | ICD-10-CM | POA: Diagnosis not present

## 2023-07-03 MED ORDER — OXYCODONE HCL 5 MG PO TABS: 5.0000 mg | ORAL_TABLET | Freq: Four times a day (QID) | ORAL | 0 refills | Status: DC | PRN

## 2023-07-03 MED ORDER — ACETAMINOPHEN 500 MG PO TABS: 1000.0000 mg | ORAL_TABLET | Freq: Three times a day (TID) | ORAL | 0 refills | Status: DC

## 2023-07-03 MED ORDER — LIDOCAINE 5 % EX PTCH
1.0000 | MEDICATED_PATCH | CUTANEOUS | Status: DC
  Administered 2023-07-03: 1 via TRANSDERMAL
  Filled 2023-07-03: qty 1

## 2023-07-03 MED ORDER — LIDOCAINE 5 % EX PTCH: 1.0000 | MEDICATED_PATCH | CUTANEOUS | 0 refills | Status: DC

## 2023-07-03 MED ORDER — IOHEXOL 350 MG/ML SOLN
75.0000 mL | Freq: Once | INTRAVENOUS | Status: AC | PRN
  Administered 2023-07-03: 75 mL via INTRAVENOUS

## 2023-07-03 MED ORDER — HYDROCODONE-ACETAMINOPHEN 5-325 MG PO TABS
1.0000 | ORAL_TABLET | Freq: Once | ORAL | Status: DC
  Filled 2023-07-03: qty 1

## 2023-07-03 NOTE — ED Provider Notes (Signed)
[place] EMERGENCY DEPARTMENT AT [facility] Provider Note  CSN: [id number] Arrival date & time: 07/02/23 1800  Chief Complaint(s) Chest Pain and Back Pain  HPI Rhonda [name] is a 52 y.o. female with PMH thyroid cancer, CKD 2, GERD, previous PE on Eliquis who presents emergency department for evaluation of chest and back pain.  Patient states that symptoms began abruptly at 5 AM this morning starting with chest pain that radiates to the back.  She states that this feels similar to her last PE and is concerned that she is having another 1 today.  She has been compliant with her Eliquis.  Denies associated shortness of breath, vomiting, headache, fever, numbness, tingling, weakness or other systemic complaints.   Past Medical History Past Medical History:  Diagnosis Date   Anxiety    C1q nephropathy    Cancer (HCC)    history of thyroid cancer   Chronic constipation    CKD (chronic kidney disease), stage II nephrologist-  dr Vern Claude [facility])   secondary to C1q nephropathy/  hypertension   Family history of adverse reaction to anesthesia    father hard to put down for anesthesia   GERD (gastroesophageal reflux disease)    H/O radioactive iodine thyroid ablation    1992  right throid   History of exercise intolerance    01-052011  ETT--  normal w/ no ischemia per cardiologist (dr Shirlee Latch) epic note   History of pulmonary embolus (PE)    07/ 2008-- treated w/ coumadin   History of syncope    recurrent --  workup done per epic -- dx orthostatic hypotension   Hyperlipidemia    Hypertension    IBS (irritable bowel syndrome)    Neuromuscular disorder (HCC)    carpal tunnel   Pelvic pain in female    Post-surgical hypothyroidism    1991 left thyroidectomy   Pre-diabetes    Proteinuria    Seasonal and perennial allergic rhinitis    Wears contact lenses    Wears glasses    Patient Active Problem List   Diagnosis Date Noted    Hypomagnesemia 10/06/2021   Hypercalcemia 10/06/2021   Colon cancer screening    Bilateral carpal tunnel syndrome 01/07/2021   Flexor carpi radialis tendinitis 01/07/2021   Vitamin D deficiency 05/19/2019   IGT (impaired glucose tolerance) 05/19/2019   Exposure to medical diagnostic radiation 02/09/2017   S/P bilateral salpingo-oophorectomy 04/28/2016   Pelvic adhesions 03/26/2016    Class: Present on Admission   History of pulmonary embolism 08/01/2014   CKD (chronic kidney disease) stage 3, GFR 30-59 ml/min (HCC) 02/27/2014   Hypothyroidism 04/28/2012   Hemorrhage of rectum and anus 02/09/2011   Constipation 02/09/2011   Dyslipidemia 08/26/2009   Backache 02/14/2008   Allergic rhinitis 11/15/2007   GERD 06/07/2007   Anxiety state 04/27/2007   Essential hypertension 04/27/2007   Disorder of kidney and ureter 03/17/2007   EMBOLISM/THROMBOSIS, DEEP VSL PRXML LWR EXTRM 03/16/2007   Home Medication(s) Prior to Admission medications   Medication Sig Start Date End Date Taking? Authorizing Provider  acetaminophen (TYLENOL) 500 MG tablet Take 2 tablets (1,000 mg total) by mouth every 8 (eight) hours. 07/03/23 08/02/23 Yes Lexus Shampine, MD  albuterol (VENTOLIN HFA) 108 (90 Base) MCG/ACT inhaler Inhale 2 puffs into the lungs every 6 (six) hours as needed. 06/15/22  Yes Philip Aspen, Limmie Patricia, MD  ALPRAZolam Prudy Feeler) 0.25 MG tablet Take 1 tablet (0.25 mg total) by mouth 2 (two) times daily as  needed for anxiety. 12/19/19  Yes Philip Aspen, Limmie Patricia, MD  apixaban (ELIQUIS) 5 MG TABS tablet Take 1 tablet (5 mg total) by mouth 2 (two) times daily. 06/23/23  Yes Philip Aspen, Limmie Patricia, MD  atorvastatin (LIPITOR) 40 MG tablet TAKE 1 TABLET(40 MG) BY MOUTH DAILY 05/17/23  Yes Philip Aspen, Limmie Patricia, MD  benazepril-hydrochlorthiazide (LOTENSIN HCT) 20-25 MG tablet TAKE 1 TABLET BY MOUTH DAILY 06/28/23  Yes Philip Aspen, Limmie Patricia, MD  cetirizine (ZYRTEC) 10 MG tablet Take 10 mg by  mouth daily.   Yes [provider]  esomeprazole (NEXIUM) 40 MG capsule TAKE ONE CAPSULE BY MOUTH EVERY DAY AT 12 NOON. 05/31/23  Yes Philip Aspen, Limmie Patricia, MD  levothyroxine (SYNTHROID) 100 MCG tablet TAKE 1 TABLET(100 MCG) BY MOUTH DAILY BEFORE BREAKFAST 04/07/23  Yes Reather Littler, MD  lidocaine (LIDODERM) 5 % Place 1 patch onto the skin daily. Remove & Discard patch within 12 hours or as directed by MD 07/03/23  Yes Akemi Overholser, MD  meclizine (ANTIVERT) 50 MG tablet Take 1 tablet (50 mg total) by mouth 3 (three) times daily as needed. 06/25/23  Yes Nafziger, Kandee Keen, NP  ondansetron (ZOFRAN) 4 MG tablet Take 1 tablet (4 mg total) by mouth every 8 (eight) hours as needed for nausea or vomiting. 06/25/23  Yes Nafziger, Kandee Keen, NP  oxyCODONE (ROXICODONE) 5 MG immediate release tablet Take 1 tablet (5 mg total) by mouth every 6 (six) hours as needed for breakthrough pain. 07/03/23  Yes Parker Wherley, MD  Peppermint Oil (IBGARD PO) Take 1 tablet by mouth daily.   Yes [provider]  docusate sodium (COLACE) 100 MG capsule Take 100 mg by mouth daily.    [provider]  glucose blood (ONETOUCH ULTRA) test strip CHECK GLUCOSE ONCE DAILY 03/12/23   Philip Aspen, Limmie Patricia, MD  Lancet Devices [facility] PLUS LANCING) MISC 1 each by Does not apply route daily. 10/13/21   Philip Aspen, Limmie Patricia, MD  Lancets [facility] DELICA PLUS [place]) MISC USE AS DIRECTED ONCE DAILY 12/15/22   Philip Aspen, Limmie Patricia, MD  ondansetron (ZOFRAN) 8 MG tablet Take 1 tablet (8 mg total) by mouth every 8 (eight) hours as needed for nausea or vomiting. Patient not taking: Reported on 07/03/2023 05/22/19   Sherrilyn Rist, MD  predniSONE (STERAPRED UNI-PAK 21 TAB) 10 MG (21) TBPK tablet Take as directed Patient not taking: Reported on 07/03/2023 04/28/23   Philip Aspen, Limmie Patricia, MD                                                                                                                                     Past Surgical History Past Surgical History:  Procedure Laterality Date   37 HOUR PH STUDY N/A 11/16/2016   Procedure: 24 HOUR PH STUDY;  Surgeon: Sherrilyn Rist, MD;  Location: [facility];  Service: Gastroenterology;  Laterality: N/A;  ABDOMINAL HYSTERECTOMY  1998   COLONOSCOPY     COLONOSCOPY WITH PROPOFOL N/A 08/05/2021   Procedure: COLONOSCOPY WITH PROPOFOL;  Surgeon: Imogene Burn, MD;  Location: [facility];  Service: Gastroenterology;  Laterality: N/A;   CYSTOSCOPY N/A 04/28/2016   Procedure: CYSTOSCOPY;  Surgeon: Richardean Chimera, MD;  Location: [facility];  Service: Gynecology;  Laterality: N/A;   ESOPHAGEAL MANOMETRY N/A 11/16/2016   Procedure: ESOPHAGEAL MANOMETRY (EM);  Surgeon: Sherrilyn Rist, MD;  Location: [facility];  Service: Gastroenterology;  Laterality: N/A;   ESOPHAGOGASTRODUODENOSCOPY     LAPAROSCOPIC CHOLECYSTECTOMY  1997   LAPAROSCOPY N/A 03/26/2016   Procedure: LAPAROSCOPY DIAGNOSTIC, LYSIS OF ADHESIONS;  Surgeon: Richardean Chimera, MD;  Location: [facility];  Service: Gynecology;  Laterality: N/A;   LAPAROTOMY N/A 04/28/2016   Procedure: EXPLORATORY LAPAROTOMY;  Surgeon: Richardean Chimera, MD;  Location: [facility];  Service: Gynecology;  Laterality: N/A;   RENAL BIOPSY     SALPINGOOPHORECTOMY Bilateral 04/28/2016   Procedure: Bilateral OOPHORECTOMY;  Surgeon: Richardean Chimera, MD;  Location: [facility];  Service: Gynecology;  Laterality: Bilateral;   THYROIDECTOMY Left 1991   TRANSTHORACIC ECHOCARDIOGRAM  04/27/2012   normal LV, ef 60-65%/  trivial MR   TUBAL LIGATION  1992   Family History Family History  Problem Relation Age of Onset   Diabetes Mother        Pre-diabetic   Hypertension Mother    Hypertension Father    Coronary artery disease Father    Diabetes Father    Stroke Father    Prostate cancer Father    Breast cancer Maternal Aunt    Thyroid disease Maternal Aunt    Colon cancer Neg Hx    Colon polyps Neg Hx     Kidney disease Neg Hx    Esophageal cancer Neg Hx    Gallbladder disease Neg Hx    Stomach cancer Neg Hx    Rectal cancer Neg Hx     Social History Social History   Tobacco Use   Smoking status: Never   Smokeless tobacco: Never  Vaping Use   Vaping status: Never Used  Substance Use Topics   Alcohol use: No    Alcohol/week: 0.0 standard drinks of alcohol   Drug use: No   Allergies Aspirin, Flagyl [metronidazole], Food, Tramadol, and Dilaudid [hydromorphone]  Review of Systems Review of Systems  Cardiovascular:  Positive for chest pain.  Musculoskeletal:  Positive for back pain.    Physical Exam Vital Signs  I have reviewed the triage vital signs BP 106/76 (BP Location: Left Arm)   Pulse 62   Temp 97.7 F (36.5 C) (Oral)   Resp 15   Ht 5\' 5"  (1.651 m)   Wt 85.7 kg   SpO2 100%   BMI 31.45 kg/m   Physical Exam Vitals and nursing note reviewed.  Constitutional:      General: She is not in acute distress.    Appearance: She is well-developed.  HENT:     Head: Normocephalic and atraumatic.  Eyes:     Conjunctiva/sclera: Conjunctivae normal.  Cardiovascular:     Rate and Rhythm: Normal rate and regular rhythm.     Heart sounds: No murmur heard. Pulmonary:     Effort: Pulmonary effort is normal. No respiratory distress.     Breath sounds: Normal breath sounds.  Abdominal:     Palpations: Abdomen is soft.     Tenderness: There is no abdominal tenderness.  Musculoskeletal:  General: Tenderness present. No swelling.     Cervical back: Neck supple.  Skin:    General: Skin is warm and dry.     Capillary Refill: Capillary refill takes less than 2 seconds.  Neurological:     Mental Status: She is alert.  Psychiatric:        Mood and Affect: Mood normal.     ED Results and Treatments Labs (all labs ordered are listed, but only abnormal results are displayed) Labs Reviewed  BASIC METABOLIC PANEL - Abnormal; Notable for the following components:       Result Value   BUN 30 (*)    Creatinine, Ser 1.62 (*)    Calcium 10.5 (*)    GFR, Estimated 38 (*)    All other components within normal limits  CBC  TROPONIN I (HIGH SENSITIVITY)  TROPONIN I (HIGH SENSITIVITY)                                                                                                                          Radiology CT Angio Chest PE W and/or Wo Contrast  Result Date: 07/03/2023 CLINICAL DATA:  Central chest pain and back pain with chest heaviness. Pulmonary embolism suspected, high probability. EXAM: CT ANGIOGRAPHY CHEST WITH CONTRAST TECHNIQUE: Multidetector CT imaging of the chest was performed using the standard protocol during bolus administration of intravenous contrast. Multiplanar CT image reconstructions and MIPs were obtained to evaluate the vascular anatomy. RADIATION DOSE REDUCTION: This exam was performed according to the departmental dose-optimization program which includes automated exposure control, adjustment of the mA and/or kV according to patient size and/or use of iterative reconstruction technique. CONTRAST:  75mL OMNIPAQUE IOHEXOL 350 MG/ML SOLN COMPARISON:  PA and lateral chest yesterday and 02/11/2023, CTA chest 01/11/2023 and CTA chest 08/30/2019. There are numerous prior CTA chest studies back to 2008 with those being the 2 most recent. FINDINGS: Cardiovascular: There previously were scattered bilateral lower lobe posterior basal subsegmental arterial emboli, which are no longer seen. Pulmonary arteries are normal caliber with no visible embolic filling defects today. There is mild cardiomegaly, unchanged. No pericardial effusion. Minimal single-vessel scattered calcific plaque noted LAD coronary artery. The aorta and great vessels are normal. The pulmonary veins are nondistended. Mediastinum/Nodes: Old thyroidectomy. No intrathoracic, supraclavicular or axillary adenopathy. Unremarkable thoracic esophagus.  Negative trachea, main bronchi.  Lungs/Pleura: Mild diffuse bronchial thickening interval increased. Increased mosaicism both lower lobes consistent with likely air trapping and small airways disease. No focal pneumonia is evident.  The lungs are otherwise clear. There is no pleural effusion, thickening or pneumothorax. Mild elevation right hemidiaphragm. Upper Abdomen: No acute abnormality.  Old cholecystectomy. Musculoskeletal: Moderate thoracic spondylosis. No acute or other significant osseous findings. Few levels with thoracic spine bridging enthesopathy. The visualized chest wall unremarkable. Review of the MIP images confirms the above findings. IMPRESSION: 1. No evidence of arterial dilatation or embolus. 2. Mild cardiomegaly. 3. Minimal single-vessel calcific plaque LAD coronary artery. 4. Diffuse bronchial thickening with  increased mosaicism in both lower lobes consistent with air trapping and small airways disease. No focal pneumonia. Electronically Signed   By: Almira Bar M.D.   On: 07/03/2023 02:15   DG Chest 2 View  Result Date: 07/02/2023 CLINICAL DATA:  Chest and back pain for 1 day, hypertension, diabetes EXAM: CHEST - 2 VIEW COMPARISON:  02/11/2023 FINDINGS: Frontal and lateral views of the chest demonstrate a stable cardiac silhouette. No airspace disease, effusion, or pneumothorax. No acute bony abnormalities. IMPRESSION: 1. No acute intrathoracic process. Electronically Signed   By: Sharlet Salina M.D.   On: 07/02/2023 20:10    Pertinent labs & imaging results that were available during my care of the patient were reviewed by me and considered in my medical decision making (see MDM for details).  Medications Ordered in ED Medications  HYDROcodone-acetaminophen (NORCO/VICODIN) 5-325 MG per tablet 1 tablet (0 tablets Oral Hold 07/03/23 0053)  lidocaine (LIDODERM) 5 % 1 patch (1 patch Transdermal Patch Applied 07/03/23 0052)  iohexol (OMNIPAQUE) 350 MG/ML injection 75 mL (75 mLs Intravenous Contrast Given 07/03/23  0149)                                                                                                                                     Procedures Procedures  (including critical care time)  Medical Decision Making / ED Course   This patient presents to the ED for concern of chest pain, back pain, this involves an extensive number of treatment options, and is a complaint that carries with it a high risk of complications and morbidity.  The differential diagnosis includes ACS, Aortic Dissection, Pneumothorax, Pneumonia, Esophageal Rupture, PE, Tamponade/Pericardial Effusion, pericarditis, esophageal spasm, dysrhythmia, GERD, costochondritis.  MDM: Patient seen emergency room for evaluation of chest and back pain.  Physical exam with reproducible tenderness over the thoracic spine but is otherwise unremarkable.  Laboratory evaluation with a BUN of 30, creatinine 1.62, calcium 10.5, high-sensitivity troponin negative.  Delta troponin negative.  Chest x-ray unremarkable.  ECG nonischemic.  PE study obtained that was reassuringly negative for PE and shows minimal single-vessel LAD plaques, small airway disease but no focal pneumonia.  Patient received a Lidoderm patch on the back and states that her symptoms have significant improved.  Low suspicion for ACS at this time with negative troponins, nonischemic ECG, and no exertional component to the pain.  At this time she does not meet inpatient criteria for admission and will be discharged with outpatient follow-up.  Return precautions given which she and her husband voiced understanding   Additional history obtained: -Additional history obtained from husband -External records from outside source obtained and reviewed including: Chart review including previous notes, labs, imaging, consultation notes   Lab Tests: -I ordered, reviewed, and interpreted labs.   The pertinent results include:   Labs Reviewed  BASIC METABOLIC PANEL - Abnormal;  Notable for the following components:  Result Value   BUN 30 (*)    Creatinine, Ser 1.62 (*)    Calcium 10.5 (*)    GFR, Estimated 38 (*)    All other components within normal limits  CBC  TROPONIN I (HIGH SENSITIVITY)  TROPONIN I (HIGH SENSITIVITY)      EKG   EKG Interpretation Date/Time:  Friday July 02 2023 18:09:23 EST Ventricular Rate:  60 PR Interval:  174 QRS Duration:  90 QT Interval:  398 QTC Calculation: 398 R Axis:   71  Text Interpretation: Sinus rhythm Borderline T abnormalities, diffuse leads Confirmed by Basia Mcginty ([id number]) on 07/03/2023 12:40:01 AM         Imaging Studies ordered: I ordered imaging studies including chest x-ray, CT PE I independently visualized and interpreted imaging. I agree with the radiologist interpretation   Medicines ordered and prescription drug management: Meds ordered this encounter  Medications   HYDROcodone-acetaminophen (NORCO/VICODIN) 5-325 MG per tablet 1 tablet   lidocaine (LIDODERM) 5 % 1 patch   iohexol (OMNIPAQUE) 350 MG/ML injection 75 mL   lidocaine (LIDODERM) 5 %    Sig: Place 1 patch onto the skin daily. Remove & Discard patch within 12 hours or as directed by MD    Dispense:  30 patch    Refill:  0   acetaminophen (TYLENOL) 500 MG tablet    Sig: Take 2 tablets (1,000 mg total) by mouth every 8 (eight) hours.    Dispense:  180 tablet    Refill:  0   oxyCODONE (ROXICODONE) 5 MG immediate release tablet    Sig: Take 1 tablet (5 mg total) by mouth every 6 (six) hours as needed for breakthrough pain.    Dispense:  6 tablet    Refill:  0    -I have reviewed the patients home medicines and have made adjustments as needed  Critical interventions none   Cardiac Monitoring: The patient was maintained on a cardiac monitor.  I personally viewed and interpreted the cardiac monitored which showed an underlying rhythm of: NSR  Social Determinants of Health:  Factors impacting patients care include:  none   Reevaluation: After the interventions noted above, I reevaluated the patient and found that they have :improved  Co morbidities that complicate the patient evaluation  Past Medical History:  Diagnosis Date   Anxiety    C1q nephropathy    Cancer (HCC)    history of thyroid cancer   Chronic constipation    CKD (chronic kidney disease), stage II nephrologist-  dr Vern Claude [facility])   secondary to C1q nephropathy/  hypertension   Family history of adverse reaction to anesthesia    father hard to put down for anesthesia   GERD (gastroesophageal reflux disease)    H/O radioactive iodine thyroid ablation    1992  right throid   History of exercise intolerance    01-052011  ETT--  normal w/ no ischemia per cardiologist (dr Shirlee Latch) epic note   History of pulmonary embolus (PE)    07/ 2008-- treated w/ coumadin   History of syncope    recurrent --  workup done per epic -- dx orthostatic hypotension   Hyperlipidemia    Hypertension    IBS (irritable bowel syndrome)    Neuromuscular disorder (HCC)    carpal tunnel   Pelvic pain in female    Post-surgical hypothyroidism    1991 left thyroidectomy   Pre-diabetes    Proteinuria    Seasonal and perennial allergic rhinitis  Wears contact lenses    Wears glasses       Dispostion: I considered admission for this patient, but at this time she does not meet inpatient criteria for admission and she will be discharged with outpatient follow-up.     Final Clinical Impression(s) / ED Diagnoses Final diagnoses:  Acute midline thoracic back pain     @PCDICTATION @    Glendora Score, MD 07/03/23 [phone]

## 2023-07-05 ENCOUNTER — Encounter (HOSPITAL_COMMUNITY): Payer: Self-pay

## 2023-07-05 ENCOUNTER — Ambulatory Visit (HOSPITAL_COMMUNITY): Payer: BC Managed Care – PPO

## 2023-07-12 ENCOUNTER — Ambulatory Visit (INDEPENDENT_AMBULATORY_CARE_PROVIDER_SITE_OTHER): Payer: BC Managed Care – PPO | Admitting: Internal Medicine

## 2023-07-12 ENCOUNTER — Encounter: Payer: Self-pay | Admitting: Internal Medicine

## 2023-07-12 VITALS — BP 110/80 | HR 79 | Temp 98.8°F | Ht 65.0 in | Wt 190.2 lb

## 2023-07-12 DIAGNOSIS — E538 Deficiency of other specified B group vitamins: Secondary | ICD-10-CM

## 2023-07-12 DIAGNOSIS — Z0000 Encounter for general adult medical examination without abnormal findings: Secondary | ICD-10-CM | POA: Diagnosis not present

## 2023-07-12 DIAGNOSIS — Z114 Encounter for screening for human immunodeficiency virus [HIV]: Secondary | ICD-10-CM

## 2023-07-12 DIAGNOSIS — I10 Essential (primary) hypertension: Secondary | ICD-10-CM | POA: Diagnosis not present

## 2023-07-12 DIAGNOSIS — Z1159 Encounter for screening for other viral diseases: Secondary | ICD-10-CM

## 2023-07-12 DIAGNOSIS — N183 Chronic kidney disease, stage 3 unspecified: Secondary | ICD-10-CM

## 2023-07-12 DIAGNOSIS — R7302 Impaired glucose tolerance (oral): Secondary | ICD-10-CM

## 2023-07-12 DIAGNOSIS — E559 Vitamin D deficiency, unspecified: Secondary | ICD-10-CM

## 2023-07-12 DIAGNOSIS — E785 Hyperlipidemia, unspecified: Secondary | ICD-10-CM | POA: Diagnosis not present

## 2023-07-12 LAB — LIPID PANEL
Cholesterol: 155 mg/dL (ref 0–200)
HDL: 47 mg/dL (ref >39.00)
LDL Cholesterol: 95 mg/dL (ref 0–99)
NonHDL: 108.16
Total CHOL/HDL Ratio: 3
Triglycerides: 65 mg/dL (ref 0.0–149.0)
VLDL: 13 mg/dL (ref 0.0–40.0)

## 2023-07-12 LAB — CBC WITH DIFFERENTIAL/PLATELET
Basophils Absolute: 0 10*3/uL (ref 0.0–0.1)
Basophils Relative: 0.6 % (ref 0.0–3.0)
Eosinophils Absolute: 0.3 10*3/uL (ref 0.0–0.7)
Eosinophils Relative: 5.8 % — ABNORMAL HIGH (ref 0.0–5.0)
HCT: 38.6 % (ref 36.0–46.0)
Hemoglobin: 12.5 g/dL (ref 12.0–15.0)
Lymphocytes Relative: 38.1 % (ref 12.0–46.0)
Lymphs Abs: 1.9 10*3/uL (ref 0.7–4.0)
MCHC: 32.5 g/dL (ref 30.0–36.0)
MCV: 86.4 fL (ref 78.0–100.0)
Monocytes Absolute: 0.6 10*3/uL (ref 0.1–1.0)
Monocytes Relative: 11 % (ref 3.0–12.0)
Neutro Abs: 2.2 10*3/uL (ref 1.4–7.7)
Neutrophils Relative %: 44.5 % (ref 43.0–77.0)
Platelets: 265 10*3/uL (ref 150.0–400.0)
RBC: 4.47 Mil/uL (ref 3.87–5.11)
RDW: 14.2 % (ref 11.5–15.5)
WBC: 5 10*3/uL (ref 4.0–10.5)

## 2023-07-12 LAB — COMPREHENSIVE METABOLIC PANEL
ALT: 20 U/L (ref 0–35)
AST: 19 U/L (ref 0–37)
Albumin: 4.2 g/dL (ref 3.5–5.2)
Alkaline Phosphatase: 96 U/L (ref 39–117)
BUN: 17 mg/dL (ref 6–23)
CO2: 30 meq/L (ref 19–32)
Calcium: 10.6 mg/dL — ABNORMAL HIGH (ref 8.4–10.5)
Chloride: 105 meq/L (ref 96–112)
Creatinine, Ser: 1.22 mg/dL — ABNORMAL HIGH (ref 0.40–1.20)
GFR: 50.33 mL/min — ABNORMAL LOW (ref >60.00)
Glucose, Bld: 95 mg/dL (ref 70–99)
Potassium: 3.9 meq/L (ref 3.5–5.1)
Sodium: 141 meq/L (ref 135–145)
Total Bilirubin: 0.4 mg/dL (ref 0.2–1.2)
Total Protein: 7.6 g/dL (ref 6.0–8.3)

## 2023-07-12 LAB — VITAMIN D 25 HYDROXY (VIT D DEFICIENCY, FRACTURES): VITD: 18.67 ng/mL — ABNORMAL LOW (ref 30.00–100.00)

## 2023-07-12 LAB — HEMOGLOBIN A1C: Hgb A1c MFr Bld: 6.1 % (ref 4.6–6.5)

## 2023-07-12 LAB — TSH: TSH: 3.44 u[IU]/mL (ref 0.35–5.50)

## 2023-07-12 LAB — VITAMIN B12: Vitamin B-12: 296 pg/mL (ref 211–911)

## 2023-07-12 NOTE — Progress Notes (Signed)
Established Patient Office Visit     CC/Reason for Visit: Annual preventive exam  HPI: Rhonda [name] is a 52 y.o. female who is coming in today for the above mentioned reasons. Past Medical History is significant for: GERD, chronic kidney disease stage II, history of PE with planned course of anticoagulation until May 2025, vitamin D and B12 deficiencies, impaired glucose tolerance.  Has routine eye care, is now due for dental exam.  Is due for shingles and COVID vaccines.   Past Medical/Surgical History: Past Medical History:  Diagnosis Date   Anxiety    C1q nephropathy    Cancer (HCC)    history of thyroid cancer   Chronic constipation    CKD (chronic kidney disease), stage II nephrologist-  dr Vern [facility])   secondary to C1q nephropathy/  hypertension   Family history of adverse reaction to anesthesia    father hard to put down for anesthesia   GERD (gastroesophageal reflux disease)    H/O radioactive iodine thyroid ablation    1992  right throid   History of exercise intolerance    01-052011  ETT--  normal w/ no ischemia per cardiologist (dr Shirlee Latch) epic note   History of pulmonary embolus (PE)    07/ 2008-- treated w/ coumadin   History of syncope    recurrent --  workup done per epic -- dx orthostatic hypotension   Hyperlipidemia    Hypertension    IBS (irritable bowel syndrome)    Neuromuscular disorder (HCC)    carpal tunnel   Pelvic pain in female    Post-surgical hypothyroidism    1991 left thyroidectomy   Pre-diabetes    Proteinuria    Seasonal and perennial allergic rhinitis    Wears contact lenses    Wears glasses     Past Surgical History:  Procedure Laterality Date   10 HOUR PH STUDY N/A 11/16/2016   Procedure: 24 HOUR PH STUDY;  Surgeon: Sherrilyn Rist, MD;  [facility];  Service: Gastroenterology;  Laterality: N/A;   ABDOMINAL HYSTERECTOMY  1998   COLONOSCOPY     COLONOSCOPY WITH  PROPOFOL N/A 08/05/2021   Procedure: COLONOSCOPY WITH PROPOFOL;  Surgeon: Imogene Burn, MD;  [facility]: [facility];  Service: Gastroenterology;  Laterality: N/A;   CYSTOSCOPY N/A 04/28/2016   Procedure: CYSTOSCOPY;  Surgeon: Richardean Chimera, MD;  Location: [facility];  Service: Gynecology;  Laterality: N/A;   ESOPHAGEAL MANOMETRY N/A 11/16/2016   Procedure: ESOPHAGEAL MANOMETRY (EM);  Surgeon: Sherrilyn Rist, MD;  [facility];  Service: Gastroenterology;  Laterality: N/A;   ESOPHAGOGASTRODUODENOSCOPY     LAPAROSCOPIC CHOLECYSTECTOMY  1997   LAPAROSCOPY N/A 03/26/2016   Procedure: LAPAROSCOPY DIAGNOSTIC, LYSIS OF ADHESIONS;  Surgeon: Richardean Chimera, MD;  Location: [facility];  Service: Gynecology;  Laterality: N/A;   LAPAROTOMY N/A 04/28/2016   Procedure: EXPLORATORY LAPAROTOMY;  Surgeon: Richardean Chimera, MD;  Location: [facility];  Service: Gynecology;  Laterality: N/A;   RENAL BIOPSY     SALPINGOOPHORECTOMY Bilateral 04/28/2016   Procedure: Bilateral OOPHORECTOMY;  Surgeon: Richardean Chimera, MD;  Location: [facility];  Service: Gynecology;  Laterality: Bilateral;   THYROIDECTOMY Left 1991   TRANSTHORACIC ECHOCARDIOGRAM  04/27/2012   normal LV, ef 60-65%/  trivial MR   TUBAL LIGATION  1992    Social History:  reports that she has never smoked. She has never used smokeless tobacco. She reports that she does not drink alcohol and does not  use drugs.  Allergies: Allergies  Allergen Reactions   Aspirin Hives   Flagyl [Metronidazole] Nausea And Vomiting    With oral flagyl.   Has tolerated vaginal flagyl.     Food Other (See Comments)    Lima beans--  Positive allergy test    Tramadol Other (See Comments)    Headache    Dilaudid [Hydromorphone] Itching    Family History:  Family History  Problem Relation Age of Onset   Diabetes Mother        Pre-diabetic   Hypertension Mother    Hypertension Father    Coronary artery disease Father    Diabetes Father    Stroke Father     Prostate cancer Father    Breast cancer Maternal Aunt    Thyroid disease Maternal Aunt    Colon cancer Neg Hx    Colon polyps Neg Hx    Kidney disease Neg Hx    Esophageal cancer Neg Hx    Gallbladder disease Neg Hx    Stomach cancer Neg Hx    Rectal cancer Neg Hx      Current Outpatient Medications:    acetaminophen (TYLENOL) 500 MG tablet, Take 2 tablets (1,000 mg total) by mouth every 8 (eight) hours., Disp: 180 tablet, Rfl: 0   albuterol (VENTOLIN HFA) 108 (90 Base) MCG/ACT inhaler, Inhale 2 puffs into the lungs every 6 (six) hours as needed., Disp: 6.7 g, Rfl: 1   ALPRAZolam (XANAX) 0.25 MG tablet, Take 1 tablet (0.25 mg total) by mouth 2 (two) times daily as needed for anxiety., Disp: 60 tablet, Rfl: 0   apixaban (ELIQUIS) 5 MG TABS tablet, Take 1 tablet (5 mg total) by mouth 2 (two) times daily., Disp: 180 tablet, Rfl: 1   atorvastatin (LIPITOR) 40 MG tablet, TAKE 1 TABLET(40 MG) BY MOUTH DAILY, Disp: 90 tablet, Rfl: 0   benazepril-hydrochlorthiazide (LOTENSIN HCT) 20-25 MG tablet, TAKE 1 TABLET BY MOUTH DAILY, Disp: 90 tablet, Rfl: 0   cetirizine (ZYRTEC) 10 MG tablet, Take 10 mg by mouth daily., Disp: , Rfl:    docusate sodium (COLACE) 100 MG capsule, Take 100 mg by mouth daily., Disp: , Rfl:    esomeprazole (NEXIUM) 40 MG capsule, TAKE ONE CAPSULE BY MOUTH EVERY DAY AT 12 NOON., Disp: 90 capsule, Rfl: 1   glucose blood (ONETOUCH ULTRA) test strip, CHECK GLUCOSE ONCE DAILY, Disp: 100 strip, Rfl: 3   Lancet Devices (ONETOUCH DELICA PLUS LANCING) MISC, 1 each by Does not apply route daily., Disp: 100 each, Rfl: 12   Lancets (ONETOUCH DELICA PLUS LANCET33G) MISC, USE AS DIRECTED ONCE DAILY, Disp: 100 each, Rfl: 3   levothyroxine (SYNTHROID) 100 MCG tablet, TAKE 1 TABLET(100 MCG) BY MOUTH DAILY BEFORE BREAKFAST, Disp: 90 tablet, Rfl: 3   lidocaine (LIDODERM) 5 %, Place 1 patch onto the skin daily. Remove & Discard patch within 12 hours or as directed by MD, Disp: 30 patch, Rfl: 0    meclizine (ANTIVERT) 50 MG tablet, Take 1 tablet (50 mg total) by mouth 3 (three) times daily as needed., Disp: 30 tablet, Rfl: 0   ondansetron (ZOFRAN) 4 MG tablet, Take 1 tablet (4 mg total) by mouth every 8 (eight) hours as needed for nausea or vomiting., Disp: 20 tablet, Rfl: 0   ondansetron (ZOFRAN) 8 MG tablet, Take 1 tablet (8 mg total) by mouth every 8 (eight) hours as needed for nausea or vomiting., Disp: 30 tablet, Rfl: 0   oxyCODONE (ROXICODONE) 5 MG immediate release tablet, Take  1 tablet (5 mg total) by mouth every 6 (six) hours as needed for breakthrough pain., Disp: 6 tablet, Rfl: 0   Peppermint Oil (IBGARD PO), Take 1 tablet by mouth daily., Disp: , Rfl:   Review of Systems:  Negative unless indicated in HPI.   Physical Exam: Vitals:   07/12/23 1352  BP: 110/80  Pulse: 79  Temp: 98.8 F (37.1 C)  TempSrc: Oral  SpO2: 97%  Weight: 190 lb 3.2 oz (86.3 kg)  Height: 5\' 5"  (1.651 m)    Body mass index is 31.65 kg/m.   Physical Exam Vitals reviewed.  Constitutional:      General: She is not in acute distress.    Appearance: Normal appearance. She is not ill-appearing, toxic-appearing or diaphoretic.  HENT:     Head: Normocephalic.     Right Ear: Tympanic membrane, ear canal and external ear normal. There is no impacted cerumen.     Left Ear: Tympanic membrane, ear canal and external ear normal. There is no impacted cerumen.     Nose: Nose normal.     Mouth/Throat:     Mouth: Mucous membranes are moist.     Pharynx: Oropharynx is clear. No oropharyngeal exudate or posterior oropharyngeal erythema.  Eyes:     General: No scleral icterus.       Right eye: No discharge.        Left eye: No discharge.     Conjunctiva/sclera: Conjunctivae normal.     Pupils: Pupils are equal, round, and reactive to light.  Neck:     Vascular: No carotid bruit.  Cardiovascular:     Rate and Rhythm: Normal rate and regular rhythm.     Pulses: Normal pulses.     Heart sounds:  Normal heart sounds.  Pulmonary:     Effort: Pulmonary effort is normal. No respiratory distress.     Breath sounds: Normal breath sounds.  Abdominal:     General: Abdomen is flat. Bowel sounds are normal.     Palpations: Abdomen is soft.  Musculoskeletal:        General: Normal range of motion.     Cervical back: Normal range of motion.  Skin:    General: Skin is warm and dry.  Neurological:     General: No focal deficit present.     Mental Status: She is alert and oriented to person, place, and time. Mental status is at baseline.  Psychiatric:        Mood and Affect: Mood normal.        Behavior: Behavior normal.        Thought Content: Thought content normal.        Judgment: Judgment normal.     Flowsheet Row Office Visit from 07/12/2023 in [facility] [place]  PHQ-9 Total Score 0        Impression and Plan:  Encounter for preventive health examination  B12 deficiency -     Vitamin B12; Future  Vitamin D deficiency -     VITAMIN D 25 Hydroxy (Vit-D Deficiency, Fractures); Future  IGT (impaired glucose tolerance) -     Hemoglobin A1c; Future  Stage 3 chronic kidney disease, unspecified whether stage 3a or 3b CKD (HCC)  Essential hypertension -     CBC with Differential/Platelet; Future -     Comprehensive metabolic panel; Future  Dyslipidemia -     Lipid panel; Future -     TSH; Future  Encounter for hepatitis C screening test  for low risk patient -     Hepatitis C antibody; Future  Encounter for screening for HIV -     HIV Antibody (routine testing w rflx); Future   -Recommend routine eye and dental care. -Healthy lifestyle discussed in detail. -Labs to be updated today. -Prostate cancer screening: N/A Health Maintenance  Topic Date Due   HIV Screening  Never done   Hepatitis C Screening  Never done   Zoster (Shingles) Vaccine (2 of 2) 07/14/2019   COVID-19 Vaccine (4 - 2023-24 season) 04/18/2023   DTaP/Tdap/Td vaccine  (2 - Td or Tdap) 06/07/2024   Mammogram  08/06/2024   Pap with HPV screening  07/31/2026   Colon Cancer Screening  08/06/2031   Flu Shot  Completed   HPV Vaccine  Aged Out     -Due for shingles and COVID vaccines but declines both today despite counseling. -Has GYN exam and mammogram already scheduled.     Chaya Jan, MD [facility]

## 2023-07-13 ENCOUNTER — Encounter: Payer: Self-pay | Admitting: Internal Medicine

## 2023-07-13 LAB — HEPATITIS C ANTIBODY: Hepatitis C Ab: NONREACTIVE

## 2023-07-13 LAB — HIV ANTIBODY (ROUTINE TESTING W REFLEX): HIV 1&2 Ab, 4th Generation: NONREACTIVE

## 2023-07-19 ENCOUNTER — Ambulatory Visit (HOSPITAL_COMMUNITY): Payer: BC Managed Care – PPO

## 2023-07-19 ENCOUNTER — Encounter (HOSPITAL_COMMUNITY): Payer: Self-pay

## 2023-07-20 ENCOUNTER — Other Ambulatory Visit: Payer: Self-pay | Admitting: Internal Medicine

## 2023-07-20 DIAGNOSIS — E559 Vitamin D deficiency, unspecified: Secondary | ICD-10-CM

## 2023-07-20 MED ORDER — VITAMIN D (ERGOCALCIFEROL) 1.25 MG (50000 UNIT) PO CAPS: 50000.0000 [IU] | ORAL_CAPSULE | ORAL | 0 refills | Status: AC

## 2023-07-21 ENCOUNTER — Ambulatory Visit: Payer: BC Managed Care – PPO | Admitting: Family Medicine

## 2023-07-21 ENCOUNTER — Encounter: Payer: Self-pay | Admitting: Internal Medicine

## 2023-07-21 ENCOUNTER — Other Ambulatory Visit: Payer: Self-pay

## 2023-07-21 VITALS — BP 106/68 | HR 70 | Ht 65.0 in | Wt 193.0 lb

## 2023-07-21 DIAGNOSIS — G8929 Other chronic pain: Secondary | ICD-10-CM | POA: Diagnosis not present

## 2023-07-21 DIAGNOSIS — M25512 Pain in left shoulder: Secondary | ICD-10-CM

## 2023-07-21 DIAGNOSIS — M25531 Pain in right wrist: Secondary | ICD-10-CM

## 2023-07-21 DIAGNOSIS — G5602 Carpal tunnel syndrome, left upper limb: Secondary | ICD-10-CM | POA: Diagnosis not present

## 2023-07-21 NOTE — Patient Instructions (Addendum)
Thank you for coming in today.   You should hear from MRI scheduling within 1 week. If you do not hear please let me know.    Work on rotator cuff exercises for your left shoulder  Check back after MRI

## 2023-07-21 NOTE — Progress Notes (Signed)
Rhonda Payor, PhD, LAT, ATC acting as a scribe for [name], MD.  Rhonda [name] is a 52 y.o. female who presents to [facility] today for cont'd R wrist pain. Pt was last seen by Dr. Denyse Amass on 09/21/22 and was given a R 1st CMC and R dorsal wrist steroid injection at the radiocarpal joint.  Today, pt reports she had to delay returning sooner do to lack of insurance. R wrist and hand pain returned sometime in the spring, not long after finishing OT. R wrist has been locking up.  She has wrist clicking popping and catching.  Pt also c/o new L arm pain x 1 month. Was seen previously at [facility] for this issue. Pt locates pain to within her L shoulder joint, w/ radiating pain throughout her whole L arm to hand/fingers. +tingling throughout.  The majority the pain is located in the left shoulder present with overhead motion and it bedtime.  Radiates: yes Aggravates: L-side lying Treatments tried: Tylenol  Dx testing: 09/26/21 R wrist XR  01/30/21 R hand XR  Pertinent review of systems: No fevers or chills  Relevant historical information: Hypertension.  History of DVT.   Exam:  BP 106/68   Pulse 70   Ht 5\' 5"  (1.651 m)   Wt 193 lb (87.5 kg)   SpO2 97%   BMI 32.12 kg/m  General: Well Developed, well nourished, and in no acute distress.   MSK: Right wrist normal. Tender palpation dorsal wrist.  Normal motion with clicking palpated within the wrist.  Left shoulder normal-appearing Normal motion pain with abduction. Intact strength. Minimally positive Hawkins and Neer's test.  Lab and Radiology Results  EXAM: RIGHT WRIST - COMPLETE 3+ VIEW   COMPARISON:  January 30, 2021.   FINDINGS: There is no evidence of fracture or dislocation. Mild degenerative changes are seen involving the first carpometacarpal joint. Soft tissues are unremarkable.   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 No  acute abnormality is noted.     Electronically Signed   By: Lupita Raider M.D.   On: 09/28/2021 12:03 I, [name], personally (independently) visualized and performed the interpretation of the images attached in this note.      Assessment and Plan: 52 y.o. female with right wrist pain.  This is an ongoing chronic issue with an acute exacerbation.  She has had different treatment options and trials for this including steroid injection and a trial of occupational therapy earlier this year lasting for over 6 weeks.  Unfortunately she has failed to improve and has continued mechanical symptoms.  Plan for MRI arthrogram of the wrist to further evaluate source of pain and mechanical catching and clicking.  Recheck once we get the MRI results back.  She has left shoulder pain and some left hand paresthesia.  The left shoulder pain is due to subacromial bursitis/impingement and is mild.  Plan for home exercise program teaching for that today.  It is possible that she has some cervical radiculopathy but I think left carpal tunnel syndrome mild in addition to the left shoulder pain is more likely.  She was thought to have carpal tunnel syndrome previously.  Plan for carpal tunnel wrist brace as well.   PDMP not reviewed this encounter. Orders Placed This Encounter  Procedures   MR WRIST RIGHT W CONTRAST    Standing Status:   Future    Standing Expiration Date:   07/20/2024    Scheduling Instructions:  Schedule w/ Dr T 1 hour prior for injection    Order Specific Question:   Reason for Exam (SYMPTOM  OR DIAGNOSIS REQUIRED)    Answer:   right wrist pain    Order Specific Question:   If indicated for the ordered procedure, I authorize the administration of contrast media per Radiology protocol    Answer:   Yes    Order Specific Question:   What is the patient's sedation requirement?    Answer:   No Sedation    Order Specific Question:   Does the patient have a pacemaker or implanted  devices?    Answer:   No    Order Specific Question:   Preferred imaging location?    Answer:   [facility] (table limit-350lbs)   DG FLUORO GUIDED NEEDLE PLC ASPIRATION/INJECTION LOC    Standing Status:   Future    Standing Expiration Date:   07/20/2024    Order Specific Question:   Lab orders requested (DO NOT place separate lab orders, these will be automatically ordered during procedure specimen collection):    Answer:   None    Order Specific Question:   Reason for Exam (SYMPTOM  OR DIAGNOSIS REQUIRED)    Answer:   chronic right wrist pain    Order Specific Question:   Is the patient pregnant?    Answer:   No    Order Specific Question:   Preferred Imaging Location?    Answer:   [facility]   No orders of the defined types were placed in this encounter.    Discussed warning signs or symptoms. Please see discharge instructions. Patient expresses understanding.   The above documentation has been reviewed and is accurate and complete [name], M.D.

## 2023-07-26 ENCOUNTER — Other Ambulatory Visit: Payer: Self-pay

## 2023-07-26 DIAGNOSIS — E890 Postprocedural hypothyroidism: Secondary | ICD-10-CM

## 2023-07-27 ENCOUNTER — Encounter: Payer: Self-pay | Admitting: Internal Medicine

## 2023-07-28 ENCOUNTER — Encounter: Payer: Self-pay | Admitting: Internal Medicine

## 2023-07-28 ENCOUNTER — Ambulatory Visit (INDEPENDENT_AMBULATORY_CARE_PROVIDER_SITE_OTHER): Payer: BC Managed Care – PPO | Admitting: Internal Medicine

## 2023-07-28 VITALS — BP 120/84 | HR 73 | Temp 98.1°F | Wt 192.3 lb

## 2023-07-28 DIAGNOSIS — J069 Acute upper respiratory infection, unspecified: Secondary | ICD-10-CM

## 2023-07-28 NOTE — Progress Notes (Signed)
Established Patient Office Visit     CC/Reason for Visit: Continued URI symptoms  HPI: Rhonda [name] is a 52 y.o. female who is coming in today for the above mentioned reasons.  She was seen previously for this.  URI symptoms have now been present for 3 weeks.  She states there has been definite improvement but she remains with a sensation of ear popping and congestion.   Past Medical/Surgical History: Past Medical History:  Diagnosis Date   Anxiety    C1q nephropathy    Cancer (HCC)    history of thyroid cancer   Chronic constipation    CKD (chronic kidney disease), stage II nephrologist-  dr Vern Claude [facility])   secondary to C1q nephropathy/  hypertension   Family history of adverse reaction to anesthesia    father hard to put down for anesthesia   GERD (gastroesophageal reflux disease)    H/O radioactive iodine thyroid ablation    1992  right throid   History of exercise intolerance    01-052011  ETT--  normal w/ no ischemia per cardiologist (dr Shirlee Latch) epic note   History of pulmonary embolus (PE)    07/ 2008-- treated w/ coumadin   History of syncope    recurrent --  workup done per epic -- dx orthostatic hypotension   Hyperlipidemia    Hypertension    IBS (irritable bowel syndrome)    Neuromuscular disorder (HCC)    carpal tunnel   Pelvic pain in female    Post-surgical hypothyroidism    1991 left thyroidectomy   Pre-diabetes    Proteinuria    Seasonal and perennial allergic rhinitis    Wears contact lenses    Wears glasses     Past Surgical History:  Procedure Laterality Date   69 HOUR PH STUDY N/A 11/16/2016   Procedure: 24 HOUR PH STUDY;  Surgeon: Sherrilyn Rist, MD;  [facility];  Service: Gastroenterology;  Laterality: N/A;   ABDOMINAL HYSTERECTOMY  1998   COLONOSCOPY     COLONOSCOPY WITH PROPOFOL N/A 08/05/2021   Procedure: COLONOSCOPY WITH PROPOFOL;  Surgeon: Imogene Burn, MD;  [facility];  Service: Gastroenterology;  Laterality: N/A;   CYSTOSCOPY N/A 04/28/2016   Procedure: CYSTOSCOPY;  Surgeon: Richardean Chimera, MD;  [facility];  Service: Gynecology;  Laterality: N/A;   ESOPHAGEAL MANOMETRY N/A 11/16/2016   Procedure: ESOPHAGEAL MANOMETRY (EM);  Surgeon: Sherrilyn Rist, MD;  [facility];  Service: Gastroenterology;  Laterality: N/A;   ESOPHAGOGASTRODUODENOSCOPY     LAPAROSCOPIC CHOLECYSTECTOMY  1997   LAPAROSCOPY N/A 03/26/2016   Procedure: LAPAROSCOPY DIAGNOSTIC, LYSIS OF ADHESIONS;  Surgeon: Richardean Chimera, MD;  Location: [facility] [place];  Service: Gynecology;  Laterality: N/A;   LAPAROTOMY N/A 04/28/2016   Procedure: EXPLORATORY LAPAROTOMY;  Surgeon: Richardean Chimera, MD;  [facility];  Service: Gynecology;  Laterality: N/A;   RENAL BIOPSY     SALPINGOOPHORECTOMY Bilateral 04/28/2016   Procedure: Bilateral OOPHORECTOMY;  Surgeon: Richardean Chimera, MD;  [facility];  Service: Gynecology;  Laterality: Bilateral;   THYROIDECTOMY Left 1991   TRANSTHORACIC ECHOCARDIOGRAM  04/27/2012   normal LV, ef 60-65%/  trivial MR   TUBAL LIGATION  1992    Social History:  reports that she has never smoked. She has never used smokeless tobacco. She reports that she does not drink alcohol and does not use drugs.  Allergies: Allergies  Allergen Reactions   Aspirin Hives  Flagyl [Metronidazole] Nausea And Vomiting    With oral flagyl.   Has tolerated vaginal flagyl.     Food Other (See Comments)    Lima beans--  Positive allergy test    Tramadol Other (See Comments)    Headache    Dilaudid [Hydromorphone] Itching    Family History:  Family History  Problem Relation Age of Onset   Diabetes Mother        Pre-diabetic   Hypertension Mother    Hypertension Father    Coronary artery disease Father    Diabetes Father    Stroke Father    Prostate cancer Father    Breast cancer Maternal Aunt    Thyroid disease Maternal Aunt    Colon cancer  Neg Hx    Colon polyps Neg Hx    Kidney disease Neg Hx    Esophageal cancer Neg Hx    Gallbladder disease Neg Hx    Stomach cancer Neg Hx    Rectal cancer Neg Hx      Current Outpatient Medications:    albuterol (VENTOLIN HFA) 108 (90 Base) MCG/ACT inhaler, Inhale 2 puffs into the lungs every 6 (six) hours as needed., Disp: 6.7 g, Rfl: 1   ALPRAZolam (XANAX) 0.25 MG tablet, Take 1 tablet (0.25 mg total) by mouth 2 (two) times daily as needed for anxiety., Disp: 60 tablet, Rfl: 0   apixaban (ELIQUIS) 5 MG TABS tablet, Take 1 tablet (5 mg total) by mouth 2 (two) times daily., Disp: 180 tablet, Rfl: 1   atorvastatin (LIPITOR) 40 MG tablet, TAKE 1 TABLET(40 MG) BY MOUTH DAILY, Disp: 90 tablet, Rfl: 0   benazepril-hydrochlorthiazide (LOTENSIN HCT) 20-25 MG tablet, TAKE 1 TABLET BY MOUTH DAILY, Disp: 90 tablet, Rfl: 0   cetirizine (ZYRTEC) 10 MG tablet, Take 10 mg by mouth daily., Disp: , Rfl:    docusate sodium (COLACE) 100 MG capsule, Take 100 mg by mouth daily., Disp: , Rfl:    esomeprazole (NEXIUM) 40 MG capsule, TAKE ONE CAPSULE BY MOUTH EVERY DAY AT 12 NOON., Disp: 90 capsule, Rfl: 1   glucose blood (ONETOUCH ULTRA) test strip, CHECK GLUCOSE ONCE DAILY, Disp: 100 strip, Rfl: 3   Lancet Devices (ONETOUCH DELICA PLUS LANCING) MISC, 1 each by Does not apply route daily., Disp: 100 each, Rfl: 12   Lancets (ONETOUCH DELICA PLUS LANCET33G) MISC, USE AS DIRECTED ONCE DAILY, Disp: 100 each, Rfl: 3   levothyroxine (SYNTHROID) 100 MCG tablet, TAKE 1 TABLET(100 MCG) BY MOUTH DAILY BEFORE BREAKFAST, Disp: 90 tablet, Rfl: 3   meclizine (ANTIVERT) 50 MG tablet, Take 1 tablet (50 mg total) by mouth 3 (three) times daily as needed., Disp: 30 tablet, Rfl: 0   ondansetron (ZOFRAN) 4 MG tablet, Take 1 tablet (4 mg total) by mouth every 8 (eight) hours as needed for nausea or vomiting., Disp: 20 tablet, Rfl: 0   ondansetron (ZOFRAN) 8 MG tablet, Take 1 tablet (8 mg total) by mouth every 8 (eight) hours as  needed for nausea or vomiting., Disp: 30 tablet, Rfl: 0   oxyCODONE (ROXICODONE) 5 MG immediate release tablet, Take 1 tablet (5 mg total) by mouth every 6 (six) hours as needed for breakthrough pain., Disp: 6 tablet, Rfl: 0   Peppermint Oil (IBGARD PO), Take 1 tablet by mouth daily., Disp: , Rfl:    Vitamin D, Ergocalciferol, (DRISDOL) 1.25 MG (50000 UNIT) CAPS capsule, Take 1 capsule (50,000 Units total) by mouth every 7 (seven) days for 12 doses., Disp: 12 capsule, Rfl:  0  Review of Systems:  Negative unless indicated in HPI.   Physical Exam: Vitals:   07/28/23 1057  BP: 120/84  Pulse: 73  Temp: 98.1 F (36.7 C)  TempSrc: Oral  SpO2: 97%  Weight: 192 lb 4.8 oz (87.2 kg)    Body mass index is 32 kg/m.   Physical Exam Vitals reviewed.  Constitutional:      Appearance: Normal appearance.  HENT:     Right Ear: Tympanic membrane, ear canal and external ear normal.     Left Ear: Tympanic membrane, ear canal and external ear normal.     Mouth/Throat:     Mouth: Mucous membranes are moist.     Pharynx: Oropharynx is clear.  Eyes:     Conjunctiva/sclera: Conjunctivae normal.     Pupils: Pupils are equal, round, and reactive to light.  Cardiovascular:     Rate and Rhythm: Normal rate and regular rhythm.  Pulmonary:     Effort: Pulmonary effort is normal.     Breath sounds: Normal breath sounds.  Neurological:     Mental Status: She is alert.      Impression and Plan:  Upper respiratory tract infection, unspecified type   -Nothing on exam today suggest a secondary bacterial infection despite length of URI symptoms.  Have advised for now continued symptomatic management with pain relievers, decongestants, guaifenesin.  Time spent:22 minutes reviewing chart, interviewing and examining patient and formulating plan of care.     Chaya Jan, MD [facility]

## 2023-07-29 ENCOUNTER — Other Ambulatory Visit: Payer: BC Managed Care – PPO

## 2023-07-30 ENCOUNTER — Other Ambulatory Visit: Payer: BC Managed Care – PPO

## 2023-07-30 DIAGNOSIS — E890 Postprocedural hypothyroidism: Secondary | ICD-10-CM | POA: Diagnosis not present

## 2023-07-31 LAB — T4, FREE: Free T4: 1.2 ng/dL (ref 0.8–1.8)

## 2023-08-02 ENCOUNTER — Ambulatory Visit (INDEPENDENT_AMBULATORY_CARE_PROVIDER_SITE_OTHER): Payer: BC Managed Care – PPO | Admitting: Endocrinology

## 2023-08-02 ENCOUNTER — Encounter: Payer: Self-pay | Admitting: Endocrinology

## 2023-08-02 ENCOUNTER — Telehealth: Payer: Self-pay

## 2023-08-02 VITALS — BP 118/70 | HR 82 | Ht 65.0 in | Wt 191.6 lb

## 2023-08-02 DIAGNOSIS — Z85850 Personal history of malignant neoplasm of thyroid: Secondary | ICD-10-CM

## 2023-08-02 DIAGNOSIS — E890 Postprocedural hypothyroidism: Secondary | ICD-10-CM | POA: Diagnosis not present

## 2023-08-02 NOTE — Telephone Encounter (Signed)
Left a HIPAA VM plus send pt portion PAP for [facility],[place] doctor portion 08/02/23

## 2023-08-02 NOTE — Progress Notes (Signed)
Outpatient Endocrinology Note Rhonda Zamzam Whinery, MD  08/02/23  Patient's Name: Rhonda [name]    DOB: 04-Sep-1970    MRN: [id number]  REASON OF VISIT: Follow-up for hypothyroidism  PCP: Philip Aspen, Limmie Patricia, MD  HISTORY OF PRESENT ILLNESS:   Rhonda [name] is a 52 y.o. old female with past medical history as listed below is presented for a follow up for hypothyroidism.   Patient was previously seen by Dr. Lucianne Muss and was last time seen in December 2023.  Pertinent Thyroid History: Patient was diagnosed with hypothyroidism in 1991.  Hypothyroidism was secondary to left thyroidectomy for goiter. She was told that after the surgery there was a focus of cancer found in her thyroid.  She reported she subsequently had radioactive iodine treatment for right thyroid after the RAI whole-body scan after 2 years of thyroid surgery.  She denies having completion thyroidectomy prior to RAI radiation treatment.  No records of history of thyroid cancer and thyroid surgery and treatment available to review.  Patient is being managed for postsurgical hypothyroidism.  No records of thyroglobulin panel and ultrasound neck or neck imaging are available to review for several years.  Patient has been on levothyroxine and dose has been adjusted periodically in the past.  She had been up to levothyroxine 150 mcg daily in the past.  Lately she has been taking levothyroxine 100 mcg daily.   Interval history Patient has been taking levothyroxine 100 mg daily.  She takes in the morning before breakfast and reports compliance.  She denies palpitation or heat intolerance.  No neck lump or any neck related symptoms.  She had labs recently with TSH 3.44 and free T4 of 1.2.   Latest Reference Range & Units 07/12/23 14:22 07/30/23 08:11  TSH 0.35 - 5.50 uIU/mL 3.44   T4,Free(Direct) 0.8 - 1.8 ng/dL  1.2   REVIEW OF SYSTEMS:  As per history of present illness.   PAST MEDICAL HISTORY: Past Medical  History:  Diagnosis Date   Anxiety    C1q nephropathy    Cancer (HCC)    history of thyroid cancer   Chronic constipation    CKD (chronic kidney disease), stage II nephrologist-  dr Vern [facility])   secondary to C1q nephropathy/  hypertension   Family history of adverse reaction to anesthesia    father hard to put down for anesthesia   GERD (gastroesophageal reflux disease)    H/O radioactive iodine thyroid ablation    1992  right throid   History of exercise intolerance    01-052011  ETT--  normal w/ no ischemia per cardiologist (dr Shirlee Latch) epic note   History of pulmonary embolus (PE)    07/ 2008-- treated w/ coumadin   History of syncope    recurrent --  workup done per epic -- dx orthostatic hypotension   Hyperlipidemia    Hypertension    IBS (irritable bowel syndrome)    Neuromuscular disorder (HCC)    carpal tunnel   Pelvic pain in female    Post-surgical hypothyroidism    1991 left thyroidectomy   Pre-diabetes    Proteinuria    Seasonal and perennial allergic rhinitis    Wears contact lenses    Wears glasses     PAST SURGICAL HISTORY: Past Surgical History:  Procedure Laterality Date   21 HOUR PH STUDY N/A 11/16/2016   Procedure: 24 HOUR PH STUDY;  Surgeon: Sherrilyn Rist, MD;  Location: [facility];  Service: Gastroenterology;  Laterality: N/A;  ABDOMINAL HYSTERECTOMY  1998   COLONOSCOPY     COLONOSCOPY WITH PROPOFOL N/A 08/05/2021   Procedure: COLONOSCOPY WITH PROPOFOL;  Surgeon: Imogene Burn, MD;  Location: [facility];  Service: Gastroenterology;  Laterality: N/A;   CYSTOSCOPY N/A 04/28/2016   Procedure: CYSTOSCOPY;  Surgeon: Richardean Chimera, MD;  Location: [facility];  Service: Gynecology;  Laterality: N/A;   ESOPHAGEAL MANOMETRY N/A 11/16/2016   Procedure: ESOPHAGEAL MANOMETRY (EM);  Surgeon: Sherrilyn Rist, MD;  Location: [facility];  Service: Gastroenterology;  Laterality: N/A;   ESOPHAGOGASTRODUODENOSCOPY      LAPAROSCOPIC CHOLECYSTECTOMY  1997   LAPAROSCOPY N/A 03/26/2016   Procedure: LAPAROSCOPY DIAGNOSTIC, LYSIS OF ADHESIONS;  Surgeon: Richardean Chimera, MD;  Location: [facility];  Service: Gynecology;  Laterality: N/A;   LAPAROTOMY N/A 04/28/2016   Procedure: EXPLORATORY LAPAROTOMY;  Surgeon: Richardean Chimera, MD;  Location: [facility];  Service: Gynecology;  Laterality: N/A;   RENAL BIOPSY     SALPINGOOPHORECTOMY Bilateral 04/28/2016   Procedure: Bilateral OOPHORECTOMY;  Surgeon: Richardean Chimera, MD;  Location: [facility];  Service: Gynecology;  Laterality: Bilateral;   THYROIDECTOMY Left 1991   TRANSTHORACIC ECHOCARDIOGRAM  04/27/2012   normal LV, ef 60-65%/  trivial MR   TUBAL LIGATION  1992    ALLERGIES: Allergies  Allergen Reactions   Aspirin Hives   Flagyl [Metronidazole] Nausea And Vomiting    With oral flagyl.   Has tolerated vaginal flagyl.     Food Other (See Comments)    Lima beans--  Positive allergy test    Tramadol Other (See Comments)    Headache    Dilaudid [Hydromorphone] Itching    FAMILY HISTORY:  Family History  Problem Relation Age of Onset   Diabetes Mother        Pre-diabetic   Hypertension Mother    Hypertension Father    Coronary artery disease Father    Diabetes Father    Stroke Father    Prostate cancer Father    Breast cancer Maternal Aunt    Thyroid disease Maternal Aunt    Colon cancer Neg Hx    Colon polyps Neg Hx    Kidney disease Neg Hx    Esophageal cancer Neg Hx    Gallbladder disease Neg Hx    Stomach cancer Neg Hx    Rectal cancer Neg Hx     SOCIAL HISTORY: Social History   Socioeconomic History   Marital status: Married    Spouse name: Not on file   Number of children: 2   Years of education: Not on file   Highest education level: GED or equivalent  Occupational History   Occupation: NURSING ASSISTANT    Employer: [employer]  Tobacco Use   Smoking status: Never   Smokeless tobacco: Never  Vaping Use   Vaping  status: Never Used  Substance and Sexual Activity   Alcohol use: No    Alcohol/week: 0.0 standard drinks of alcohol   Drug use: No   Sexual activity: Yes    Birth control/protection: Surgical  Other Topics Concern   Not on file  Social History Narrative   Not on file   Social Drivers of Health   Financial Resource Strain: Low Risk  (06/24/2023)   Overall Financial Resource Strain (CARDIA)    Difficulty of Paying Living Expenses: Not hard at all  Food Insecurity: No Food Insecurity (06/24/2023)   Hunger Vital Sign    Worried About Running Out of Food in the Last Year: Never true  Ran Out of Food in the Last Year: Never true  Transportation Needs: No Transportation Needs (06/24/2023)   PRAPARE - Administrator, Civil Service (Medical): No    Lack of Transportation (Non-Medical): No  Physical Activity: Insufficiently Active (06/24/2023)   Exercise Vital Sign    Days of Exercise per Week: 3 days    Minutes of Exercise per Session: 20 min  Stress: Stress Concern Present (06/24/2023)   [employer] of Occupational Health - Occupational Stress Questionnaire    Feeling of Stress : To some extent  Social Connections: Moderately Integrated (06/24/2023)   Social Connection and Isolation Panel [NHANES]    Frequency of Communication with Friends and Family: More than three times a week    Frequency of Social Gatherings with Friends and Family: Once a week    Attends Religious Services: More than 4 times per year    Active Member of [employer] or Organizations: No    Attends Engineer, structural: Not on file    Marital Status: Married    MEDICATIONS:  Current Outpatient Medications  Medication Sig Dispense Refill   albuterol (VENTOLIN HFA) 108 (90 Base) MCG/ACT inhaler Inhale 2 puffs into the lungs every 6 (six) hours as needed. 6.7 g 1   ALPRAZolam (XANAX) 0.25 MG tablet Take 1 tablet (0.25 mg total) by mouth 2 (two) times daily as needed for anxiety. 60 tablet 0    apixaban (ELIQUIS) 5 MG TABS tablet Take 1 tablet (5 mg total) by mouth 2 (two) times daily. 180 tablet 1   atorvastatin (LIPITOR) 40 MG tablet TAKE 1 TABLET(40 MG) BY MOUTH DAILY 90 tablet 0   benazepril-hydrochlorthiazide (LOTENSIN HCT) 20-25 MG tablet TAKE 1 TABLET BY MOUTH DAILY 90 tablet 0   cetirizine (ZYRTEC) 10 MG tablet Take 10 mg by mouth daily.     docusate sodium (COLACE) 100 MG capsule Take 100 mg by mouth daily.     esomeprazole (NEXIUM) 40 MG capsule TAKE ONE CAPSULE BY MOUTH EVERY DAY AT 12 NOON. 90 capsule 1   glucose blood (ONETOUCH ULTRA) test strip CHECK GLUCOSE ONCE DAILY 100 strip 3   Lancet Devices (ONETOUCH DELICA PLUS LANCING) MISC 1 each by Does not apply route daily. 100 each 12   Lancets (ONETOUCH DELICA PLUS LANCET33G) MISC USE AS DIRECTED ONCE DAILY 100 each 3   levothyroxine (SYNTHROID) 100 MCG tablet TAKE 1 TABLET(100 MCG) BY MOUTH DAILY BEFORE BREAKFAST 90 tablet 3   meclizine (ANTIVERT) 50 MG tablet Take 1 tablet (50 mg total) by mouth 3 (three) times daily as needed. 30 tablet 0   ondansetron (ZOFRAN) 4 MG tablet Take 1 tablet (4 mg total) by mouth every 8 (eight) hours as needed for nausea or vomiting. 20 tablet 0   ondansetron (ZOFRAN) 8 MG tablet Take 1 tablet (8 mg total) by mouth every 8 (eight) hours as needed for nausea or vomiting. 30 tablet 0   oxyCODONE (ROXICODONE) 5 MG immediate release tablet Take 1 tablet (5 mg total) by mouth every 6 (six) hours as needed for breakthrough pain. 6 tablet 0   Peppermint Oil (IBGARD PO) Take 1 tablet by mouth daily.     Vitamin D, Ergocalciferol, (DRISDOL) 1.25 MG (50000 UNIT) CAPS capsule Take 1 capsule (50,000 Units total) by mouth every 7 (seven) days for 12 doses. 12 capsule 0   No current facility-administered medications for this visit.    PHYSICAL EXAM: Vitals:   08/02/23 1527  BP: 118/70  Pulse: 82  SpO2: 97%  Weight: 191 lb 9.6 oz (86.9 kg)  Height: 5\' 5"  (1.651 m)   Body mass index is 31.88 kg/m.   Wt Readings from Last 3 Encounters:  08/02/23 191 lb 9.6 oz (86.9 kg)  07/28/23 192 lb 4.8 oz (87.2 kg)  07/21/23 193 lb (87.5 kg)    General: Well developed, well nourished female in no apparent distress.  HEENT: AT/[place], no external lesions. Hearing intact to the spoken word Eyes: EOMI. No stare, proptosis or lid lag. Conjunctiva clear and no icterus. Neck: Trachea midline, neck supple without palpable nodule.  Anterior neck is scar present.  Lungs: Clear to auscultation, no wheeze. Respirations not labored Heart: S1S2, Regular in rate and rhythm. No loud murmurs Abdomen: Soft, non tender, non distended, no masses, no striae Neurologic: Alert, oriented, normal speech, deep tendon biceps reflexes normal,  no gross focal neurological deficit Extremities: No pedal pitting edema, no tremors of outstretched hands Skin: Warm, color good.  Psychiatric: Does not appear depressed or anxious  PERTINENT HISTORIC LABORATORY AND IMAGING STUDIES:  All pertinent laboratory results were reviewed. Please see HPI also for further details.   TSH  Date Value Ref Range Status  07/12/2023 3.44 0.35 - 5.50 uIU/mL Final  07/31/2022 1.15 0.35 - 5.50 uIU/mL Final  06/15/2022 0.38 0.35 - 5.50 uIU/mL Final   T3, Total  Date Value Ref Range Status  08/05/2018 106 76 - 181 ng/dL Final     ASSESSMENT / PLAN  1. Postoperative hypothyroidism   2. History of thyroid cancer    -Patient has remote history of thyroid cancer status post left thyroidectomy in 1991.  Patient reports was treated with RAI therapy after 2 years of the surgery and denies having completion thyroidectomy.  No records of thyroid cancer treatment and monitoring available to review.  Patient is being managed as postsurgical hypothyroidism. -She is currently taking levothyroxine 100 mcg daily.  Plan: -Will repeat thyroid function test today.  Will maintain TSH in the normal range. -I would also like to check thyroglobulin and  thyroglobulin antibody.  If she did not have completion thyroidectomy for right thyroid lobe she can have measurable thyroglobulin.  In that case we will consider neck ultrasound to check. -She is low risk for thyroid cancer recurrence as of history of thyroid cancer is remote. -Annual endocrinology follow-up.   Diagnoses and all orders for this visit:  Postoperative hypothyroidism -     T4, free -     TSH  History of thyroid cancer -     Thyroglobulin Level -     Thyroglobulin antibody    DISPOSITION Follow up in clinic in 12 months suggested.  All questions answered and patient verbalized understanding of the plan.  Rhonda Suhaib Guzzo, MD [facility] Endocrinology [facility] [address] [place], [address] [place], [place] [address] Phone # [phone]  At least part of this note was generated using voice recognition software. Inadvertent word errors may have occurred, which were not recognized during the proofreading process.

## 2023-08-03 LAB — THYROGLOBULIN LEVEL: Thyroglobulin: 0.1 ng/mL — ABNORMAL LOW

## 2023-08-03 LAB — T4, FREE: Free T4: 1.1 ng/dL (ref 0.8–1.8)

## 2023-08-03 LAB — TSH: TSH: 3.81 m[IU]/L

## 2023-08-03 LAB — THYROGLOBULIN ANTIBODY: Thyroglobulin Ab: <1 [IU]/mL (ref <=1)

## 2023-08-04 ENCOUNTER — Encounter: Payer: Self-pay | Admitting: Endocrinology

## 2023-08-16 ENCOUNTER — Other Ambulatory Visit: Payer: BC Managed Care – PPO

## 2023-08-16 ENCOUNTER — Ambulatory Visit (INDEPENDENT_AMBULATORY_CARE_PROVIDER_SITE_OTHER): Payer: BC Managed Care – PPO | Admitting: Sports Medicine

## 2023-08-16 ENCOUNTER — Ambulatory Visit (INDEPENDENT_AMBULATORY_CARE_PROVIDER_SITE_OTHER): Payer: BC Managed Care – PPO

## 2023-08-16 DIAGNOSIS — M25531 Pain in right wrist: Secondary | ICD-10-CM | POA: Diagnosis not present

## 2023-08-16 DIAGNOSIS — G8929 Other chronic pain: Secondary | ICD-10-CM

## 2023-08-16 DIAGNOSIS — M1811 Unilateral primary osteoarthritis of first carpometacarpal joint, right hand: Secondary | ICD-10-CM | POA: Diagnosis not present

## 2023-08-16 MED ORDER — GADOBUTROL 1 MMOL/ML IV SOLN
1.0000 mL | Freq: Once | INTRAVENOUS | Status: AC | PRN
  Administered 2023-08-16: 1 mL via INTRAVENOUS

## 2023-08-16 NOTE — Telephone Encounter (Signed)
Received pt portion on application for [employer] squbbs on [employer] waiting on Provider portion.

## 2023-08-16 NOTE — Progress Notes (Signed)
    Procedures performed today:    Procedure: Real-time Ultrasound Guided gadolinium contrast injection of right radiocarpal joint Device: Samsung HS60  Verbal informed consent obtained.  Time-out conducted.  Noted no overlying erythema, induration, or other signs of local infection.  Skin prepped in a sterile fashion.  Local anesthesia: Topical Ethyl chloride.  With sterile technique and under real time ultrasound guidance: 25-gauge needle advanced into the dorsal radiocarpal joint, I did see a small joint effusion, I injected 1 cc kenalog 40, 1 cc lidocaine, syringe switched and 0.05 mL of gadolinium injected, syringe again switched and approximately 2 to 3 mL sterile saline used to fully distend the joint. Joint visualized and capsule seen distending confirming intra-articular placement of contrast material and medication. Completed without difficulty  Advised to call if fevers/chills, erythema, induration, drainage, or persistent bleeding.  Images permanently stored in PACS Impression: Technically successful ultrasound guided gadolinium contrast injection for MR arthrography.  Please see separate MR arthrogram report.  Independent interpretation of notes and tests performed by another provider:   None.  Brief History, Exam, Impression, and Recommendations:    Chronic pain of right wrist Chronic wrist pain, failed conservative treatment, per primary treating provider Notes there are for the mechanical symptoms we are going to be looking into a ligamentous disruption. I performed the injection for MR arthrography, further management per primary treating provider.    ____________________________________________ Ihor Austin. Benjamin Stain, M.D., ABFM., CAQSM., AME. Primary Care and Sports Medicine [facility]  Adjunct Professor of Family Medicine  [employer] [facility] [employer]  Restaurant manager, fast food

## 2023-08-16 NOTE — Assessment & Plan Note (Signed)
Chronic wrist pain, failed conservative treatment, per primary treating provider Notes there are for the mechanical symptoms we are going to be looking into a ligamentous disruption. I performed the injection for MR arthrography, further management per primary treating provider.

## 2023-08-23 NOTE — Telephone Encounter (Signed)
 Send Dr portion again,have already send on 08/02/23,pt already mail back pt portion awaiting on Dr portion back.

## 2023-08-25 ENCOUNTER — Ambulatory Visit: Payer: BC Managed Care – PPO | Admitting: Family Medicine

## 2023-08-25 ENCOUNTER — Ambulatory Visit
Admission: RE | Admit: 2023-08-25 | Discharge: 2023-08-25 | Disposition: A | Discharge status: Home / Self Care | Payer: BC Managed Care – PPO | Source: Ambulatory Visit | Attending: Internal Medicine | Admitting: Internal Medicine

## 2023-08-25 ENCOUNTER — Other Ambulatory Visit: Payer: Self-pay | Admitting: Internal Medicine

## 2023-08-25 DIAGNOSIS — E785 Hyperlipidemia, unspecified: Secondary | ICD-10-CM

## 2023-08-25 DIAGNOSIS — Z1231 Encounter for screening mammogram for malignant neoplasm of breast: Secondary | ICD-10-CM

## 2023-08-30 ENCOUNTER — Ambulatory Visit (INDEPENDENT_AMBULATORY_CARE_PROVIDER_SITE_OTHER): Payer: BC Managed Care – PPO | Admitting: Family Medicine

## 2023-08-30 ENCOUNTER — Other Ambulatory Visit: Payer: Self-pay

## 2023-08-30 ENCOUNTER — Ambulatory Visit (INDEPENDENT_AMBULATORY_CARE_PROVIDER_SITE_OTHER): Payer: BC Managed Care – PPO

## 2023-08-30 VITALS — Ht 65.0 in | Wt 194.0 lb

## 2023-08-30 DIAGNOSIS — M25531 Pain in right wrist: Secondary | ICD-10-CM

## 2023-08-30 DIAGNOSIS — M778 Other enthesopathies, not elsewhere classified: Secondary | ICD-10-CM | POA: Diagnosis not present

## 2023-08-30 DIAGNOSIS — M25512 Pain in left shoulder: Secondary | ICD-10-CM

## 2023-08-30 DIAGNOSIS — G8929 Other chronic pain: Secondary | ICD-10-CM

## 2023-08-30 NOTE — Patient Instructions (Addendum)
 Thank you for coming in today.   Please get an Xray today before you leave   I've referred you to Occupational Therapy.  Let us know if you don't hear from them in one week.   You should hear from Dr Fara Boros as well about that hand.

## 2023-08-30 NOTE — Progress Notes (Signed)
 LILLETTE Ileana Collet, PhD, LAT, ATC acting as a scribe for Artist Lloyd, MD.  Rhonda [name] is a 53 y.o. female who presents to [facility] today for R wrist and L shoulder pain. Pt was last seen by Dr. Lloyd on 07/21/23 and a MRI arthrogram was ordered and she was taught HEP for her shoulder.   Today, pt reports R wrist pain continue. Pain is consistent.  Pain is located on the dorsal aspect of the wrist worse with motion better with rest.  She did have a trial of hand therapy for this in the spring 2024 which did not help enough.  This part of her arthrogram injection a few weeks ago for her wrist MRI arthrogram she did have a steroid injection of the wrist which she does not think helped very much.  L shoulder pain is awful. Pain L-side lying. Pain is waking her up at night. +mechanical symptoms.   Dx testing:  08/16/23 R wrist MRI-A 09/26/21 R wrist XR  01/30/21 R hand XR  Pertinent review of systems: No fevers or chills  Relevant historical information: Hypertension   Exam:  Ht 5' 5 (1.651 m)   Wt 194 lb (88 kg)   BMI 32.28 kg/m  General: Well Developed, well nourished, and in no acute distress.   MSK: Right wrist and hand are normal-appearing Nontender to palpation dorsal wrist or thumb.  Decreased range of motion wrist.  Left shoulder: Normal-appearing normal motion pain with abduction. Intact strength. Positive Hawkins and Neer's test. Negative Yergason's and speeds test.    Lab and Radiology Results  X-ray images left shoulder obtained today personally and independently interpreted No acute fractures.  No severe degenerative changes. Await formal radiology review   EXAM: MRI OF THE RIGHT WRIST WITH CONTRAST (MR Arthrogram)   TECHNIQUE: Multiplanar, multisequence MR imaging of the wrist was performed immediately following contrast injection into the radiocarpal joint under fluoroscopic guidance. No intravenous contrast  was administered.   COMPARISON:  Radiographs 09/26/2021. No injection fluoroscopic images available.   FINDINGS: Ligaments: Contrast was presumably injected into the radiocarpal joint. There is extension of contrast into the midcarpal joint via a possible occult central tear of the scapholunate ligament. There is no scapholunate diastasis. The lunotriquetral ligament is grossly intact.   Triangular fibrocartilage: The triangular fibrocartilage complex appears mildly degenerated without discrete tear. No contrast extends into the distal radioulnar joint. There is a small amount non contrasted fluid in the joint. The ulnar variance is neutral.   Tendons: There is a small amount of non contrasted fluid within the extensor carpi radialis tendon sheath, possibly related to the anesthetic. There is a small amount of contrast within the flexor carpi radialis tendon sheath. No evidence of tendon tear or subluxation.   Carpal tunnel/median nerve: Unremarkable.   Guyon's canal: Unremarkable.   Joint/cartilage: As above, there is intra-articular contrast within the radiocarpal and midcarpal compartments. Age advanced degenerative changes are present at the 1st carpometacarpal joint with joint space narrowing, osteophytes and subchondral edema. Mild joint space narrowing at the 3rd carpometacarpal and scaphotrapeziotrapezoidal joints.Prominent dorsal midcarpal joint recess radially.   Bones/carpal alignment: The carpal bone alignment is normal.No evidence of acute fracture, dislocation or osteonecrosis. Small intraosseous cysts within the capitate and base of the 3rd metacarpal.   Other: Possible small contrast containing ganglia dorsal to the 3rd carpometacarpal joint.   IMPRESSION: 1. 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 Mild degenerative changes at the 3rd carpometacarpal  and scaphotrapeziotrapezoidal joints. 2. Possible occult central tear of the  scapholunate ligament allowing communication between the radiocarpal and midcarpal compartments. No scapholunate diastasis. 3. Mild degeneration of the triangular fibrocartilage complex without discrete tear. 4. No acute osseous findings.     Electronically Signed   By: Elsie Perone M.D.   On: 08/28/2023 11:46 I, Artist Lloyd, personally (independently) visualized and performed the interpretation of the images attached in this note.   Assessment and Plan: 53 y.o. female with chronic right wrist pain.  Etiology is unclear.  Her MRI arthrogram obtained about 2 weeks ago shows arthritis at the first [facility] and a possible tear of the scapholunate ligament and some possible degeneration of the TFCC.  However she does not have enough abnormality in the middle portion of the wrist to explain her dorsal wrist pain and lack of range of motion.  She does not think a steroid injection as part of the arthrogram helped all that much.  Plan for surgical consultation with a hand surgeon to discuss her options.  I suspect she is probably going to be recommended to have a retrial of hand therapy.  Will go ahead and do that as well.  Left shoulder pain due to impingement and bursitis.  She would like to avoid a steroid injection.  Plan to refer to occupational therapy as well.  Consider steroid injection in future or MRI if not improved.   PDMP not reviewed this encounter. Orders Placed This Encounter  Procedures   DG Shoulder Left    Standing Status:   Future    Number of Occurrences:   1    Expiration Date:   08/29/2024    Reason for Exam (SYMPTOM  OR DIAGNOSIS REQUIRED):   eval shoulder pain    Is patient pregnant?:   No    Preferred imaging location?:   [facility]   Ambulatory referral to Occupational Therapy    Referral Priority:   Routine    Referral Type:   Occupational Therapy    Referral Reason:   Specialty Services Required    Requested Specialty:   Occupational Therapy    Number of  Visits Requested:   1   Ambulatory referral to Orthopedic Surgery    Referral Priority:   Routine    Referral Type:   Surgical    Referral Reason:   Specialty Services Required    Referred to Provider:   Arlinda Buster, MD    Requested Specialty:   Orthopedic Surgery    Number of Visits Requested:   1   No orders of the defined types were placed in this encounter.    Discussed warning signs or symptoms. Please see discharge instructions. Patient expresses understanding.   The above documentation has been reviewed and is accurate and complete Artist Lloyd, M.D.

## 2023-09-02 NOTE — Therapy (Signed)
OUTPATIENT OCCUPATIONAL THERAPY ORTHO EVALUATION  Patient Name: Rhonda [name] MRN: [id number] DOB:June 27, 1971, 53 y.o., female Today's Date: 09/06/2023  PCP: Chaya Jan, MD REFERRING PROVIDER:  Rodolph Bong, MD    END OF SESSION:  OT End of Session - 09/06/23 1523     Visit Number 1    Number of Visits 10    Date for OT Re-Evaluation 10/22/23    Authorization Type BCBS    OT Start Time 1523    OT Stop Time 1604    OT Time Calculation (min) 41 min    Equipment Utilized During Treatment compressive strap    Activity Tolerance Patient tolerated treatment well;No increased pain;Patient limited by pain    Behavior During Therapy [facility] for tasks assessed/performed             Past Medical History:  Diagnosis Date   Anxiety    C1q nephropathy    Cancer (HCC)    history of thyroid cancer   Chronic constipation    CKD (chronic kidney disease), stage II nephrologist-  dr Vern Claude [facility])   secondary to C1q nephropathy/  hypertension   Family history of adverse reaction to anesthesia    father hard to put down for anesthesia   GERD (gastroesophageal reflux disease)    H/O radioactive iodine thyroid ablation    1992  right throid   History of exercise intolerance    01-052011  ETT--  normal w/ no ischemia per cardiologist (dr Shirlee Latch) epic note   History of pulmonary embolus (PE)    07/ 2008-- treated w/ coumadin   History of syncope    recurrent --  workup done per epic -- dx orthostatic hypotension   Hyperlipidemia    Hypertension    IBS (irritable bowel syndrome)    Neuromuscular disorder (HCC)    carpal tunnel   Pelvic pain in female    Post-surgical hypothyroidism    1991 left thyroidectomy   Pre-diabetes    Proteinuria    Seasonal and perennial allergic rhinitis    Wears contact lenses    Wears glasses    Past Surgical History:  Procedure Laterality Date   72 HOUR PH STUDY N/A 11/16/2016   Procedure: 24  HOUR PH STUDY;  Surgeon: Sherrilyn Rist, MD;  Location: [facility];  Service: Gastroenterology;  Laterality: N/A;   ABDOMINAL HYSTERECTOMY  1998   COLONOSCOPY     COLONOSCOPY WITH PROPOFOL N/A 08/05/2021   Procedure: COLONOSCOPY WITH PROPOFOL;  Surgeon: Imogene Burn, MD;  Location: [facility];  Service: Gastroenterology;  Laterality: N/A;   CYSTOSCOPY N/A 04/28/2016   Procedure: CYSTOSCOPY;  Surgeon: Richardean Chimera, MD;  Location: [facility];  Service: Gynecology;  Laterality: N/A;   ESOPHAGEAL MANOMETRY N/A 11/16/2016   Procedure: ESOPHAGEAL MANOMETRY (EM);  Surgeon: Sherrilyn Rist, MD;  [facility]: [facility];  Service: Gastroenterology;  Laterality: N/A;   ESOPHAGOGASTRODUODENOSCOPY     LAPAROSCOPIC CHOLECYSTECTOMY  1997   LAPAROSCOPY N/A 03/26/2016   Procedure: LAPAROSCOPY DIAGNOSTIC, LYSIS OF ADHESIONS;  Surgeon: Richardean Chimera, MD;  Location: [facility];  Service: Gynecology;  Laterality: N/A;   LAPAROTOMY N/A 04/28/2016   Procedure: EXPLORATORY LAPAROTOMY;  Surgeon: Richardean Chimera, MD;  Location: [facility];  Service: Gynecology;  Laterality: N/A;   RENAL BIOPSY     SALPINGOOPHORECTOMY Bilateral 04/28/2016   Procedure: Bilateral OOPHORECTOMY;  Surgeon: Richardean Chimera, MD;  Location: [facility];  Service: Gynecology;  Laterality: Bilateral;   THYROIDECTOMY Left 1991  TRANSTHORACIC ECHOCARDIOGRAM  04/27/2012   normal LV, ef 60-65%/  trivial MR   TUBAL LIGATION  1992   Patient Active Problem List   Diagnosis Date Noted   Chronic pain of right wrist 08/16/2023   Hypomagnesemia 10/06/2021   Hypercalcemia 10/06/2021   Colon cancer screening    Bilateral carpal tunnel syndrome 01/07/2021   Flexor carpi radialis tendinitis 01/07/2021   Vitamin D deficiency 05/19/2019   IGT (impaired glucose tolerance) 05/19/2019   Exposure to medical diagnostic radiation 02/09/2017   S/P bilateral salpingo-oophorectomy 04/28/2016   Pelvic adhesions 03/26/2016    Class: Present on Admission    History of pulmonary embolism 08/01/2014   CKD (chronic kidney disease) stage 3, GFR 30-59 ml/min (HCC) 02/27/2014   Hypothyroidism 04/28/2012   Hemorrhage of rectum and anus 02/09/2011   Constipation 02/09/2011   Dyslipidemia 08/26/2009   Backache 02/14/2008   Allergic rhinitis 11/15/2007   GERD 06/07/2007   Anxiety state 04/27/2007   Essential hypertension 04/27/2007   Disorder of kidney and ureter 03/17/2007   EMBOLISM/THROMBOSIS, DEEP VSL PRXML LWR EXTRM 03/16/2007    ONSET DATE: chronic   REFERRING DIAG:  M25.512,G89.29 (ICD-10-CM) - Chronic left shoulder pain  M25.531,G89.29 (ICD-10-CM) - Chronic pain of right wrist    THERAPY DIAG:  Muscle weakness (generalized)  Other lack of coordination  Pain in right wrist  Chronic left shoulder pain  Stiffness of left shoulder, not elsewhere classified  Rationale for Evaluation and Treatment: Rehabilitation  SUBJECTIVE:   SUBJECTIVE STATEMENT: She states having terrible pain in Lt shoulder especially when sleeping on it.  Her Rt wrist is also hurting "for years" and more sore at work.  This pain is different than the last time she was in therapy-that had more to do with thumb OA/mild de Quervain's tenosynovitis issues. She is a CNA    PERTINENT HISTORY: Pt was seen in April 2024 and D/C OT successfully to HEP after reducing RT hand/thumb pain (some measures from that visit will be referenced as baseline for this eval).   Pt continues to hand Rt hand/wrist/thumb pain and newer onset Lt shoulder pain.    Imaging findings : "IMPRESSION: 1. 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 Mild degenerative changes at the 3rd carpometacarpal and scaphotrapeziotrapezoidal joints. 2. 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 No scapholunate diastasis. 3. Mild degeneration of the triangular fibrocartilage  complex without discrete tear. 4. No acute osseous findings."  PRECAUTIONS: None ;  RED FLAGS:  None   WEIGHT BEARING RESTRICTIONS: No  PAIN:  Are you having pain? Yes: NPRS scale: 3-4/10 in Rt wrist and up to 7-8/10 in Lkt shoulder at night  Pain location: Rt wrist dorsum, Lt shoulder  Pain description: Aching and sometimes sharp Aggravating factors: Sleeping on left shoulder, lots of use to right wrist Relieving factors: Rest and heat  FALLS: Has patient fallen in last 6 months? No  PLOF: Independent  PATIENT GOALS: To improve pain in left shoulder and right wrist  NEXT MD VISIT: She sees the hand surgeon next Monday and return to Dr. Denyse Amass as needed  OBJECTIVE: (All objective assessments below are from initial evaluation on: 09/06/2023 unless otherwise specified.)    HAND DOMINANCE: Right   ADLs: Overall ADLs: States decreased ability to grab, hold household objects, pain and difficulty to open containers, perform FMS tasks (manipulate fasteners on clothing)   FUNCTIONAL OUTCOME MEASURES: 09/06/2023 eval: We ran out of time to test  this today but will be tested in the next session quick DASH TBD% impairment today  (Higher % Score  =  More Impairment)    (12/01/18 Quick DASH: 9% impairment)     UPPER EXTREMITY ROM     Shoulder to Wrist AROM Rt/Lt 12/01/22 Right / Left 09/06/23 eval  Shoulder flexion  141 left  Shoulder abduction  135 left  Shoulder extension    Shoulder internal rotation  39 left  Shoulder external rotation  45 left  Elbow flexion    Elbow extension    Forearm supination  55 Rt  Forearm pronation   82 Rt  Wrist flexion 56 Rt/ 69 Lt 33 Rt   Wrist extension 68* Rt / 75* Lt 27 Rt  Wrist ulnar deviation    Wrist radial deviation    Functional dart thrower's motion (F-DTM) in ulnar flexion 37 Lt 31 Rt   F-DTM in radial extension  80 Lt 62 Rt  (Blank rows = not tested)   Hand AROM Right 12/01/22 Right 09/06/23 eval  Full Fist Ability (or Gap to  Distal Palmar Crease) full Full fist bilaterally  Thumb Opposition  (Kapandji Scale)  10 Within functional limits  Thumb MCP (0-60) 61   Thumb IP (0-80) 52   Thumb Radial Abduction Span 5.8cm    Thumb Palmar Abduction Span     (Blank rows = not tested)   UPPER EXTREMITY MMT:     MMT Right 12/01/22 Rt / Left 09/06/2023  Shoulder flexion  4/5 left  Shoulder abduction  4/5 left  Shoulder adduction    Shoulder extension    Shoulder internal rotation  4 /5 left  Shoulder external rotation  4 -/5 tender left  Middle trapezius    Lower trapezius    Elbow flexion    Dart Thrower's motion  5/5 right  Forearm supination    Forearm pronation    Wrist flexion 5/5 4 -/5 tender right  Wrist extension 5/5 4 -/5 tender right  Wrist ulnar deviation 4+/5   Wrist radial deviation 5/5   (Blank rows = not tested)  HAND FUNCTION: Eval: Observed weakness in affected Rt hand.  Grip strength Right: 31 lbs (tender through thenar eminence); 61# with compressive wrist strap on; Left: 55.5 lbs   (12/01/22: Rt grip: 56.5#, Lt: 59#)   COORDINATION: Eval: Observed coordination impairments with affected right hand.  Details will be tested when able. Box and Blocks Test: Right TBD blocks today (TBD is [facility])  (12/01/22 Rt 9HPT: 19sec WNL)   SENSATION: Eval:  Light touch intact today, though some complaints of nerve pain running down the posterior left shoulder to the back of the hand  EDEMA:   Eval: None significant bilaterally  COGNITION: Eval: Overall cognitive status: WFL for evaluation today    OBSERVATIONS:   Eval:  Rt wrist testing:  neg watson's test, slight "click" with midcarpal shift test (seems negative) is TTP right over S-L interval.    Dart thrower's motion is non-painful.   She does present as some chronic irritation through the STT area or the SL interval which is much better with compressive support.  Suspicious for chronic issues as a result of the scapholunate ligament damage.  Lt  shoulder testing:  spasm in external rotator and upper traps/supraspinatus.    TTP in these areas. Neg empty can test but IR is very tight, radial nerve is irritated in posterior shoulder and triceps and makes back of hand tingle. Seems related to sleep postures.  Presents as tight and spasming external rotator group as a result of poor sleep postures and poor scapular mobility   TODAY'S TREATMENT:  Post-evaluation treatment:   She was given self-care/safety education to try to prevent wear and tear to the right wrist and use a compressive wrist strap when doing any lifting pushing or pulling.  She should avoid heavy or repetitive gripping for now and until she sees the Hydrographic surveyor.  She was told that she could do wrist stretches lightly as long as they were not painful to help maintain her mobility.  Additionally she was educated on proper sleep postures for her shoulder to avoid external rotation, also to do the following home exercise program to work on the tightness in the posterior capsule.  Additionally she was recommended to do some dry needling on the posterior scapula and was given a safety packet on it today to consider.  She states understanding all directions and leaves without any significant problems.  Exercises - Sleeper Stretch  - 3-4 x daily - 5 reps - 15-20 sec hold - Standing neck/upper traps stretch  - 4-6 x daily - 3-5 reps - 15 sec hold   PATIENT EDUCATION: Education details: See tx section above for details  Person educated: Patient Education method: Verbal Instruction, Teach back, Handouts  Education comprehension: States and demonstrates understanding, Additional Education required    HOME EXERCISE PROGRAM: Access Code: [email or web address] URL: [email or web address] Date: 09/06/2023 Prepared by: Fannie Knee   GOALS: Goals reviewed with patient? Yes   SHORT TERM GOALS: (STG required if POC>30 days) Target Date: 09/24/2023  Pt will obtain protective,  custom orthotic. Goal status: TBD/PRN  2.  Pt will demo/state understanding of initial HEP to improve pain levels and prerequisite motion. Goal status: INITIAL   LONG TERM GOALS: Target Date: 10/22/2023  Pt will improve functional ability by decreased impairment per Quick DASH assessment from TBD to less than 10% or better, for better quality of life. Goal status: INITIAL  2.  Pt will improve grip strength in right hand from 31 lbs to at least 60 lbs (without compressive strap use) for functional use at home and in IADLs. Goal status: INITIAL  3.  Pt will improve A/ROM in left shoulder internal/external rotation from 39/45 to at least 50/70, to have functional motion for tasks like reach and grasp.  Goal status: INITIAL  4.  Pt will improve strength in left wrist flexion/extension from 4 -/5 tender MMT to at least 4+/5 MMT to have increased functional ability to carry out selfcare and higher-level homecare tasks with less difficulty. Goal status: INITIAL  5.  Pt will improve coordination skills in right arm and hand, as seen by within functional limit score on box and blocks testing to have increased functional ability to carry out fine motor tasks (fasteners, etc.) and more complex, coordinated IADLs (meal prep, sports, etc.).  Goal status: INITIAL  6.  Pt will decrease pain at worst from 7-8/10 to 3/10 or better to have better sleep and occupational participation in daily roles. Goal status: INITIAL   ASSESSMENT:  CLINICAL IMPRESSION: Patient is a 53 y.o. female who was seen today for occupational therapy evaluation for left shoulder pain thought to be tight external rotators and the rotator cuff from poor sleeping postures, as well as chronic right wrist pain concerning for degenerative changes at the SL interval with possible SL ligament tearing.  OT will consult with our hand surgeon about possible wrist issues.  She did respond to  compressive wrist wrap today.  She will benefit  from outpatient occupational therapy to decrease symptoms and increase quality of life  PERFORMANCE DEFICITS: in functional skills including ADLs, IADLs, coordination, ROM, strength, pain, fascial restrictions, muscle spasms, flexibility, Gross motor control, body mechanics, endurance, decreased knowledge of precautions, and UE functional use, cognitive skills including problem solving, and psychosocial skills including coping strategies, environmental adaptation, and habits.   IMPAIRMENTS: are limiting patient from ADLs, IADLs, rest and sleep, work, and leisure.   COMORBIDITIES: may have co-morbidities  that affects occupational performance. Patient will benefit from skilled OT to address above impairments and improve overall function.  MODIFICATION OR ASSISTANCE TO COMPLETE EVALUATION: Min-Moderate modification of tasks or assist with assess necessary to complete an evaluation.  OT OCCUPATIONAL PROFILE AND HISTORY: Detailed assessment: Review of records and additional review of physical, cognitive, psychosocial history related to current functional performance.  CLINICAL DECISION MAKING: Moderate - several treatment options, min-mod task modification necessary  REHAB POTENTIAL: Excellent  EVALUATION COMPLEXITY: Moderate      PLAN:  OT FREQUENCY: 1-2x/week  OT DURATION: 6 weeks through 10/22/2023 and up to 10 total visits as needed  PLANNED INTERVENTIONS: 97168 OT Re-evaluation, 97535 self care/ADL training, 86578 therapeutic exercise, 97530 therapeutic activity, 97112 neuromuscular re-education, 97140 manual therapy, 97035 ultrasound, 97039 fluidotherapy, 97010 moist heat, 97010 cryotherapy, 97760 Orthotics management and training, 46962 Splinting (initial encounter), [id number] Subsequent splinting/medication, compression bandaging, Dry needling, energy conservation, coping strategies training, and patient/family education  RECOMMENDED OTHER SERVICES: None now  CONSULTED AND AGREED WITH  PLAN OF CARE: Patient  PLAN FOR NEXT SESSION:   Review initial home exercise program as well as recommendations for right wrist.  Review hand surgeons notes for information on right wrist.  Ensure left shoulder exercises are going well.   Fannie Knee, OTR/L, CHT 09/06/2023, 5:41 PM

## 2023-09-06 ENCOUNTER — Encounter: Payer: Self-pay | Admitting: Family Medicine

## 2023-09-06 ENCOUNTER — Ambulatory Visit (INDEPENDENT_AMBULATORY_CARE_PROVIDER_SITE_OTHER): Payer: BC Managed Care – PPO | Admitting: Rehabilitative and Restorative Service Providers"

## 2023-09-06 ENCOUNTER — Encounter: Payer: Self-pay | Admitting: Rehabilitative and Restorative Service Providers"

## 2023-09-06 DIAGNOSIS — M6281 Muscle weakness (generalized): Secondary | ICD-10-CM | POA: Diagnosis not present

## 2023-09-06 DIAGNOSIS — M25531 Pain in right wrist: Secondary | ICD-10-CM | POA: Diagnosis not present

## 2023-09-06 DIAGNOSIS — R278 Other lack of coordination: Secondary | ICD-10-CM | POA: Diagnosis not present

## 2023-09-06 DIAGNOSIS — M25612 Stiffness of left shoulder, not elsewhere classified: Secondary | ICD-10-CM

## 2023-09-06 DIAGNOSIS — G8929 Other chronic pain: Secondary | ICD-10-CM

## 2023-09-06 DIAGNOSIS — M25512 Pain in left shoulder: Secondary | ICD-10-CM | POA: Diagnosis not present

## 2023-09-06 NOTE — Patient Instructions (Signed)

## 2023-09-06 NOTE — Progress Notes (Signed)
Left shoulder x-ray shows mild arthritis

## 2023-09-13 ENCOUNTER — Ambulatory Visit (INDEPENDENT_AMBULATORY_CARE_PROVIDER_SITE_OTHER): Payer: BC Managed Care – PPO | Admitting: Orthopedic Surgery

## 2023-09-13 DIAGNOSIS — M25531 Pain in right wrist: Secondary | ICD-10-CM

## 2023-09-13 DIAGNOSIS — G8929 Other chronic pain: Secondary | ICD-10-CM | POA: Diagnosis not present

## 2023-09-13 NOTE — Progress Notes (Signed)
Rhonda [name] - 53 y.o. female MRN [id number]  Date of birth: [birth year]  Office Visit Note: Visit Date: 09/13/2023 PCP: Philip Aspen, Limmie Patricia, MD Referred by: Rodolph Bong, MD  Subjective: Chief Complaint  Patient presents with   Right Wrist - Pain   HPI: Rhonda [name] is a pleasant 53 y.o. female who presents today for evaluation of ongoing right wrist pain.  Her symptoms have been present now for greater than a year, she denies any significant injury or inciting event.  She was recently established with occupational therapy once again, however was requested that she be seen by myself prior to further intervention from a therapy standpoint.  Pertinent ROS were reviewed with the patient and found to be negative unless otherwise specified above in HPI.   Visit Reason: Right wrist pain Duration of symptoms: greater than a year Hand dominance: right Occupation:CNA Diabetic: No-prediabetic Smoking: No Heart/Lung History:none Blood Thinners: yes-Eliquis- history of blood clots  Prior Testing/EMG: MRI right wrist Injections (Date): multiple-last one on 08/16/2023 Treatments:therapy  Prior Surgery:none  Assessment & Plan: Visit Diagnoses: No diagnosis found.  Plan: Extensive discussion was had with the patient today regarding her ongoing right wrist pain.  I reviewed in detail her MRI with her today which does show some age-appropriate degenerative changes at the thumb CMC interval as well as the STT region.  As far as the possible occult subtle tear of the scapholunate ligament, there is no significant diastases seen on the imaging which correlates with her clinical examination in which there is no significant instability at this region.  Based upon her pain at this site, there is likely a possibility for a occult central tear of the scapholunate ligament, however without any gross instability, we can allow this to heal nonoperatively, particularly without  any significant diastases.  There does appear to be some degeneration at the TFCC region as well, however no significant tear was appreciated on the MRI.  I am comfortable with her continuing occupational therapy in order to help maximize her functional gain at the right wrist and hand from a range of motion and strengthening standpoint.  She is welcome to return to me in the future on an as-needed basis.  She expressed full understanding.  Follow-up: No follow-ups on file.   Meds & Orders: No orders of the defined types were placed in this encounter.  No orders of the defined types were placed in this encounter.    Procedures: No procedures performed      Clinical History: No specialty comments available.  She reports that she has never smoked. She has never used smokeless tobacco.  Recent Labs    07/12/23 1422  HGBA1C 6.1    Objective:   Vital Signs: There were no vitals taken for this visit.  Physical Exam  Gen: Well-appearing, in no acute distress; non-toxic CV: Regular Rate. Well-perfused. Warm.  Resp: Breathing unlabored on room air; no wheezing. Psych: Fluid speech in conversation; appropriate affect; normal thought process  Ortho Exam Right hand: - Moderate tenderness over the thumb CMC region with deep palpation, positive grind for pain and crepitus, no significant MP hyperextension - Able to form composite fist without restriction - Moderate tenderness noted over the scapholunate interval dorsally - Watson test does demonstrate some pain, negative for instability or clunk - Mild tenderness over the foveal region, DRUJ stable in all planes  Imaging: Prior MRI of the right wrist was reviewed in detail today  Past Medical/Family/Surgical/Social History: Medications &  Allergies reviewed per EMR, new medications updated. Patient Active Problem List   Diagnosis Date Noted   Chronic pain of right wrist 08/16/2023   Hypomagnesemia 10/06/2021   Hypercalcemia  10/06/2021   Colon cancer screening    Bilateral carpal tunnel syndrome 01/07/2021   Flexor carpi radialis tendinitis 01/07/2021   Vitamin D deficiency 05/19/2019   IGT (impaired glucose tolerance) 05/19/2019   Exposure to medical diagnostic radiation 02/09/2017   S/P bilateral salpingo-oophorectomy 04/28/2016   Pelvic adhesions 03/26/2016    Class: Present on Admission   History of pulmonary embolism 08/01/2014   CKD (chronic kidney disease) stage 3, GFR 30-59 ml/min (HCC) 02/27/2014   Hypothyroidism 04/28/2012   Hemorrhage of rectum and anus 02/09/2011   Constipation 02/09/2011   Dyslipidemia 08/26/2009   Backache 02/14/2008   Allergic rhinitis 11/15/2007   GERD 06/07/2007   Anxiety state 04/27/2007   Essential hypertension 04/27/2007   Disorder of kidney and ureter 03/17/2007   EMBOLISM/THROMBOSIS, DEEP VSL PRXML LWR EXTRM 03/16/2007   Past Medical History:  Diagnosis Date   Anxiety    C1q nephropathy    Cancer (HCC)    history of thyroid cancer   Chronic constipation    CKD (chronic kidney disease), stage II nephrologist-  dr Vern Claude [facility])   secondary to C1q nephropathy/  hypertension   Family history of adverse reaction to anesthesia    father hard to put down for anesthesia   GERD (gastroesophageal reflux disease)    H/O radioactive iodine thyroid ablation    1992  right throid   History of exercise intolerance    01-052011  ETT--  normal w/ no ischemia per cardiologist (dr Shirlee Latch) epic note   History of pulmonary embolus (PE)    07/ 2008-- treated w/ coumadin   History of syncope    recurrent --  workup done per epic -- dx orthostatic hypotension   Hyperlipidemia    Hypertension    IBS (irritable bowel syndrome)    Neuromuscular disorder (HCC)    carpal tunnel   Pelvic pain in female    Post-surgical hypothyroidism    1991 left thyroidectomy   Pre-diabetes    Proteinuria    Seasonal and perennial allergic rhinitis    Wears  contact lenses    Wears glasses    Family History  Problem Relation Age of Onset   Diabetes Mother        Pre-diabetic   Hypertension Mother    Hypertension Father    Coronary artery disease Father    Diabetes Father    Stroke Father    Prostate cancer Father    Breast cancer Maternal Aunt    Thyroid disease Maternal Aunt    Colon cancer Neg Hx    Colon polyps Neg Hx    Kidney disease Neg Hx    Esophageal cancer Neg Hx    Gallbladder disease Neg Hx    Stomach cancer Neg Hx    Rectal cancer Neg Hx    Past Surgical History:  Procedure Laterality Date   19 HOUR PH STUDY N/A 11/16/2016   Procedure: 24 HOUR PH STUDY;  Surgeon: Sherrilyn Rist, MD;  Location: [facility];  Service: Gastroenterology;  Laterality: N/A;   ABDOMINAL HYSTERECTOMY  1998   COLONOSCOPY     COLONOSCOPY WITH PROPOFOL N/A 08/05/2021   Procedure: COLONOSCOPY WITH PROPOFOL;  Surgeon: Imogene Burn, MD;  Location: [facility];  Service: Gastroenterology;  Laterality: N/A;   CYSTOSCOPY N/A 04/28/2016  Procedure: CYSTOSCOPY;  Surgeon: Richardean Chimera, MD;  Location: [facility];  Service: Gynecology;  Laterality: N/A;   ESOPHAGEAL MANOMETRY N/A 11/16/2016   Procedure: ESOPHAGEAL MANOMETRY (EM);  Surgeon: Sherrilyn Rist, MD;  Location: [facility];  Service: Gastroenterology;  Laterality: N/A;   ESOPHAGOGASTRODUODENOSCOPY     LAPAROSCOPIC CHOLECYSTECTOMY  1997   LAPAROSCOPY N/A 03/26/2016   Procedure: LAPAROSCOPY DIAGNOSTIC, LYSIS OF ADHESIONS;  Surgeon: Richardean Chimera, MD;  Location: [facility];  Service: Gynecology;  Laterality: N/A;   LAPAROTOMY N/A 04/28/2016   Procedure: EXPLORATORY LAPAROTOMY;  Surgeon: Richardean Chimera, MD;  Location: [facility];  Service: Gynecology;  Laterality: N/A;   RENAL BIOPSY     SALPINGOOPHORECTOMY Bilateral 04/28/2016   Procedure: Bilateral OOPHORECTOMY;  Surgeon: Richardean Chimera, MD;  Location: [facility];  Service: Gynecology;  Laterality: Bilateral;   THYROIDECTOMY Left 1991    TRANSTHORACIC ECHOCARDIOGRAM  04/27/2012   normal LV, ef 60-65%/  trivial MR   TUBAL LIGATION  1992   Social History   Occupational History   Occupation: NURSING ASSISTANT    Employer: [employer]  Tobacco Use   Smoking status: Never   Smokeless tobacco: Never  Vaping Use   Vaping status: Never Used  Substance and Sexual Activity   Alcohol use: No    Alcohol/week: 0.0 standard drinks of alcohol   Drug use: No   Sexual activity: Yes    Birth control/protection: Surgical    Ameyah Bangura Fara Boros) Denese Killings, M.D. [facility]

## 2023-09-16 NOTE — Therapy (Incomplete)
OUTPATIENT OCCUPATIONAL THERAPY ORTHO EVALUATION  Patient Name: Rhonda [name] MRN: [id number] DOB:[birth year], 53 y.o., female Today's Date: 09/16/2023  PCP: Chaya Jan, MD REFERRING PROVIDER:  Rodolph Bong, MD    END OF SESSION:    Past Medical History:  Diagnosis Date   Anxiety    C1q nephropathy    Cancer (HCC)    history of thyroid cancer   Chronic constipation    CKD (chronic kidney disease), stage II nephrologist-  dr Vern [facility])   secondary to C1q nephropathy/  hypertension   Family history of adverse reaction to anesthesia    father hard to put down for anesthesia   GERD (gastroesophageal reflux disease)    H/O radioactive iodine thyroid ablation    1992  right throid   History of exercise intolerance    01-052011  ETT--  normal w/ no ischemia per cardiologist (dr Shirlee Latch) epic note   History of pulmonary embolus (PE)    07/ 2008-- treated w/ coumadin   History of syncope    recurrent --  workup done per epic -- dx orthostatic hypotension   Hyperlipidemia    Hypertension    IBS (irritable bowel syndrome)    Neuromuscular disorder (HCC)    carpal tunnel   Pelvic pain in female    Post-surgical hypothyroidism    1991 left thyroidectomy   Pre-diabetes    Proteinuria    Seasonal and perennial allergic rhinitis    Wears contact lenses    Wears glasses    Past Surgical History:  Procedure Laterality Date   30 HOUR PH STUDY N/A 11/16/2016   Procedure: 24 HOUR PH STUDY;  Surgeon: Sherrilyn Rist, MD;  Location: [facility];  Service: Gastroenterology;  Laterality: N/A;   ABDOMINAL HYSTERECTOMY  1998   COLONOSCOPY     COLONOSCOPY WITH PROPOFOL N/A 08/05/2021   Procedure: COLONOSCOPY WITH PROPOFOL;  Surgeon: Imogene Burn, MD;  Location: [facility];  Service: Gastroenterology;  Laterality: N/A;   CYSTOSCOPY N/A 04/28/2016   Procedure: CYSTOSCOPY;  Surgeon: Richardean Chimera, MD;  Location: [facility];   Service: Gynecology;  Laterality: N/A;   ESOPHAGEAL MANOMETRY N/A 11/16/2016   Procedure: ESOPHAGEAL MANOMETRY (EM);  Surgeon: Sherrilyn Rist, MD;  Location: [facility];  Service: Gastroenterology;  Laterality: N/A;   ESOPHAGOGASTRODUODENOSCOPY     LAPAROSCOPIC CHOLECYSTECTOMY  1997   LAPAROSCOPY N/A 03/26/2016   Procedure: LAPAROSCOPY DIAGNOSTIC, LYSIS OF ADHESIONS;  Surgeon: Richardean Chimera, MD;  Location: [facility];  Service: Gynecology;  Laterality: N/A;   LAPAROTOMY N/A 04/28/2016   Procedure: EXPLORATORY LAPAROTOMY;  Surgeon: Richardean Chimera, MD;  Location: [facility];  Service: Gynecology;  Laterality: N/A;   RENAL BIOPSY     SALPINGOOPHORECTOMY Bilateral 04/28/2016   Procedure: Bilateral OOPHORECTOMY;  Surgeon: Richardean Chimera, MD;  Location: [facility];  Service: Gynecology;  Laterality: Bilateral;   THYROIDECTOMY Left 1991   TRANSTHORACIC ECHOCARDIOGRAM  04/27/2012   normal LV, ef 60-65%/  trivial MR   TUBAL LIGATION  1992   Patient Active Problem List   Diagnosis Date Noted   Chronic pain of right wrist 08/16/2023   Hypomagnesemia 10/06/2021   Hypercalcemia 10/06/2021   Colon cancer screening    Bilateral carpal tunnel syndrome 01/07/2021   Flexor carpi radialis tendinitis 01/07/2021   Vitamin D deficiency 05/19/2019   IGT (impaired glucose tolerance) 05/19/2019   Exposure to medical diagnostic radiation 02/09/2017   S/P bilateral salpingo-oophorectomy 04/28/2016   Pelvic adhesions 03/26/2016  Class: Present on Admission   History of pulmonary embolism 08/01/2014   CKD (chronic kidney disease) stage 3, GFR 30-59 ml/min (HCC) 02/27/2014   Hypothyroidism 04/28/2012   Hemorrhage of rectum and anus 02/09/2011   Constipation 02/09/2011   Dyslipidemia 08/26/2009   Backache 02/14/2008   Allergic rhinitis 11/15/2007   GERD 06/07/2007   Anxiety state 04/27/2007   Essential hypertension 04/27/2007   Disorder of kidney and ureter 03/17/2007   EMBOLISM/THROMBOSIS, DEEP  VSL PRXML LWR EXTRM 03/16/2007    ONSET DATE: chronic   REFERRING DIAG:  M25.512,G89.29 (ICD-10-CM) - Chronic left shoulder pain  M25.531,G89.29 (ICD-10-CM) - Chronic pain of right wrist    THERAPY DIAG:  No diagnosis found.  Rationale for Evaluation and Treatment: Rehabilitation  PERTINENT HISTORY: Pt was seen in April 2024 and D/C OT successfully to HEP after reducing RT hand/thumb pain (some measures from that visit will be referenced as baseline for this eval).   Pt continues to hand Rt hand/wrist/thumb pain and newer onset Lt shoulder pain.    Imaging findings : "IMPRESSION: 1. 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 Mild degenerative changes at the 3rd carpometacarpal and scaphotrapeziotrapezoidal joints. 2. 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 No scapholunate diastasis. 3. Mild degeneration of the triangular fibrocartilage complex without discrete tear. 4. No acute osseous findings." She states having terrible pain in Lt shoulder especially when sleeping on it.  Her Rt wrist is also hurting "for years" and more sore at work.  This pain is different than the last time she was in therapy-that had more to do with thumb OA/mild de Quervain's tenosynovitis issues. She is a CNA  PRECAUTIONS: None ;  RED FLAGS:  None   WEIGHT BEARING RESTRICTIONS: No   SUBJECTIVE:   SUBJECTIVE STATEMENT:  She states ***.     PAIN:  Are you having pain? Yes: NPRS scale: *** 3-4/10 in Rt wrist and up to 7-8/10 in Lkt shoulder at night  Pain location: Rt wrist dorsum, Lt shoulder  Pain description: Aching and sometimes sharp Aggravating factors: Sleeping on left shoulder, lots of use to right wrist Relieving factors: Rest and heat  PATIENT GOALS: To improve pain in left shoulder and right wrist  NEXT MD VISIT: She sees the hand surgeon next Monday and return to  Dr. Denyse Amass as needed   OBJECTIVE: (All objective assessments below are from initial evaluation on: 09/06/2023 unless otherwise specified.)    HAND DOMINANCE: Right   ADLs: Overall ADLs: States decreased ability to grab, hold household objects, pain and difficulty to open containers, perform FMS tasks (manipulate fasteners on clothing)   FUNCTIONAL OUTCOME MEASURES: 09/20/23: Quick DASH: *** %   09/06/2023 eval: We ran out of time to test this today but will be tested in the next session quick DASH TBD% impairment today  (Higher % Score  =  More Impairment)    (12/01/18 Quick DASH: 9% impairment)     UPPER EXTREMITY ROM     Shoulder to Wrist AROM Rt/Lt 12/01/22 Right / Left 09/06/23 eval Rt / Lt  09/20/23  Shoulder flexion  141 left   Shoulder abduction  135 left   Shoulder extension     Shoulder internal rotation  39 left ***  Shoulder external rotation  45 left ***  Elbow flexion     Elbow extension     Forearm supination  55 Rt ***  Forearm pronation   82 Rt ***  Wrist flexion 56 Rt/ 69 Lt 33 Rt    Wrist extension 68* Rt / 75* Lt 27 Rt   Wrist ulnar deviation     Wrist radial deviation     Functional dart thrower's motion (F-DTM) in ulnar flexion 37 Lt 31 Rt    F-DTM in radial extension  80 Lt 62 Rt   (Blank rows = not tested)   Hand AROM Right 12/01/22 Right 09/06/23 eval  Full Fist Ability (or Gap to Distal Palmar Crease) full Full fist bilaterally  Thumb Opposition  (Kapandji Scale)  10 Within functional limits  Thumb MCP (0-60) 61   Thumb IP (0-80) 52   Thumb Radial Abduction Span 5.8cm    Thumb Palmar Abduction Span     (Blank rows = not tested)   UPPER EXTREMITY MMT:     MMT Right 12/01/22 Rt / Left 09/06/2023  Shoulder flexion  4/5 left  Shoulder abduction  4/5 left  Shoulder adduction    Shoulder extension    Shoulder internal rotation  4 /5 left  Shoulder external rotation  4 -/5 tender left  Middle trapezius    Lower trapezius    Elbow flexion     Dart Thrower's motion  5/5 right  Forearm supination    Forearm pronation    Wrist flexion 5/5 4 -/5 tender right  Wrist extension 5/5 4 -/5 tender right  Wrist ulnar deviation 4+/5   Wrist radial deviation 5/5   (Blank rows = not tested)  HAND FUNCTION: Eval: Observed weakness in affected Rt hand.  Grip strength Right: 31 lbs (tender through thenar eminence); 61# with compressive wrist strap on; Left: 55.5 lbs   (12/01/22: Rt grip: 56.5#, Lt: 59#)   COORDINATION: Eval: Observed coordination impairments with affected right hand.  Details will be tested when able. Box and Blocks Test: Right TBD blocks today (TBD is [facility])  (12/01/22 Rt 9HPT: 19sec WNL)   SENSATION: Eval:  Light touch intact today, though some complaints of nerve pain running down the posterior left shoulder to the back of the hand  OBSERVATIONS:   Eval:  Rt wrist testing:  neg watson's test, slight "click" with midcarpal shift test (seems negative) is TTP right over S-L interval.    Dart thrower's motion is non-painful.   She does present as some chronic irritation through the STT area or the SL interval which is much better with compressive support.  Suspicious for chronic issues as a result of the scapholunate ligament damage.  Lt shoulder testing:  spasm in external rotator and upper traps/supraspinatus.    TTP in these areas. Neg empty can test but IR is very tight, radial nerve is irritated in posterior shoulder and triceps and makes back of hand tingle. Seems related to sleep postures.  Presents as tight and spasming external rotator group as a result of poor sleep postures and poor scapular mobility   TODAY'S TREATMENT:  09/20/23: *** Review initial home exercise program as well as recommendations for right wrist.  Review hand surgeons notes for information on right wrist.  Ensure left shoulder exercises are going well.   Exercises - Sleeper Stretch  - 3-4 x daily - 5 reps - 15-20 sec hold - Standing neck/upper  traps stretch  - 4-6 x daily - 3-5 reps - 15 sec hold   PATIENT EDUCATION: Education details: See tx section above for details  Person educated: Patient Education method: Verbal Instruction, Teach back, Handouts  Education comprehension: States and demonstrates understanding, Additional  Education required    HOME EXERCISE PROGRAM: Access Code: [email or web address] URL: [email or web address] Date: 09/06/2023 Prepared by: Fannie Knee   GOALS: Goals reviewed with patient? Yes   SHORT TERM GOALS: (STG required if POC>30 days) Target Date: 09/24/2023  Pt will obtain protective, custom orthotic. Goal status: TBD/PRN  2.  Pt will demo/state understanding of initial HEP to improve pain levels and prerequisite motion. Goal status: INITIAL   LONG TERM GOALS: Target Date: 10/22/2023  Pt will improve functional ability by decreased impairment per Quick DASH assessment from TBD to less than 10% or better, for better quality of life. Goal status: INITIAL  2.  Pt will improve grip strength in right hand from 31 lbs to at least 60 lbs (without compressive strap use) for functional use at home and in IADLs. Goal status: INITIAL  3.  Pt will improve A/ROM in left shoulder internal/external rotation from 39/45 to at least 50/70, to have functional motion for tasks like reach and grasp.  Goal status: INITIAL  4.  Pt will improve strength in left wrist flexion/extension from 4 -/5 tender MMT to at least 4+/5 MMT to have increased functional ability to carry out selfcare and higher-level homecare tasks with less difficulty. Goal status: INITIAL  5.  Pt will improve coordination skills in right arm and hand, as seen by within functional limit score on box and blocks testing to have increased functional ability to carry out fine motor tasks (fasteners, etc.) and more complex, coordinated IADLs (meal prep, sports, etc.).  Goal status: INITIAL  6.  Pt will decrease pain at worst from 7-8/10  to 3/10 or better to have better sleep and occupational participation in daily roles. Goal status: INITIAL   ASSESSMENT:  CLINICAL IMPRESSION: 09/20/23: ***  Eval: Patient is a 53 y.o. female who was seen today for occupational therapy evaluation for left shoulder pain thought to be tight external rotators and the rotator cuff from poor sleeping postures, as well as chronic right wrist pain concerning for degenerative changes at the SL interval with possible SL ligament tearing.  OT will consult with our hand surgeon about possible wrist issues.  She did respond to compressive wrist wrap today.  She will benefit from outpatient occupational therapy to decrease symptoms and increase quality of life   PLAN:  OT FREQUENCY: 1-2x/week  OT DURATION: 6 weeks through 10/22/2023 and up to 10 total visits as needed  PLANNED INTERVENTIONS: 97168 OT Re-evaluation, 97535 self care/ADL training, 16109 therapeutic exercise, 97530 therapeutic activity, 97112 neuromuscular re-education, 97140 manual therapy, 97035 ultrasound, 97039 fluidotherapy, 97010 moist heat, 97010 cryotherapy, 97760 Orthotics management and training, 60454 Splinting (initial encounter), [id number] Subsequent splinting/medication, compression bandaging, Dry needling, energy conservation, coping strategies training, and patient/family education  CONSULTED AND AGREED WITH PLAN OF CARE: Patient  PLAN FOR NEXT SESSION:   ***   Fannie Knee, OTR/L, CHT 09/16/2023, 5:17 PM

## 2023-09-17 NOTE — Telephone Encounter (Signed)
Following up on pt pap on [facility] waiting on providers portion, has re faxed it today and will follow up with a call to Dr office.

## 2023-09-20 ENCOUNTER — Encounter: Payer: BC Managed Care – PPO | Admitting: Rehabilitative and Restorative Service Providers"

## 2023-09-22 DIAGNOSIS — R052 Subacute cough: Secondary | ICD-10-CM | POA: Diagnosis not present

## 2023-09-22 DIAGNOSIS — H1045 Other chronic allergic conjunctivitis: Secondary | ICD-10-CM | POA: Diagnosis not present

## 2023-09-22 DIAGNOSIS — J3089 Other allergic rhinitis: Secondary | ICD-10-CM | POA: Diagnosis not present

## 2023-09-23 ENCOUNTER — Other Ambulatory Visit: Payer: Self-pay | Admitting: Internal Medicine

## 2023-09-23 NOTE — Telephone Encounter (Signed)
 Submitted via fax a [employer] application to company for [name],will follow up.

## 2023-10-04 DIAGNOSIS — T781XXA Other adverse food reactions, not elsewhere classified, initial encounter: Secondary | ICD-10-CM | POA: Diagnosis not present

## 2023-10-05 ENCOUNTER — Ambulatory Visit: Payer: BC Managed Care – PPO

## 2023-10-05 ENCOUNTER — Other Ambulatory Visit: Payer: BC Managed Care – PPO

## 2023-10-05 DIAGNOSIS — Z111 Encounter for screening for respiratory tuberculosis: Secondary | ICD-10-CM

## 2023-10-06 NOTE — Telephone Encounter (Signed)
Following up with Rhonda [name] on pt application they said they are still looking into her application,to follow up on Monday 2/24 they will have a status on application.

## 2023-10-08 ENCOUNTER — Other Ambulatory Visit: Payer: Self-pay | Admitting: Internal Medicine

## 2023-10-08 DIAGNOSIS — E559 Vitamin D deficiency, unspecified: Secondary | ICD-10-CM

## 2023-10-08 LAB — QUANTIFERON-TB GOLD PLUS
Mitogen-NIL: 7.99 [IU]/mL
NIL: 0.03 [IU]/mL
QuantiFERON-TB Gold Plus: NEGATIVE
TB1-NIL: 0 [IU]/mL
TB2-NIL: <0 [IU]/mL

## 2023-10-11 DIAGNOSIS — Z6831 Body mass index (BMI) 31.0-31.9, adult: Secondary | ICD-10-CM | POA: Diagnosis not present

## 2023-10-11 DIAGNOSIS — Z01419 Encounter for gynecological examination (general) (routine) without abnormal findings: Secondary | ICD-10-CM | POA: Diagnosis not present

## 2023-10-11 DIAGNOSIS — Z1151 Encounter for screening for human papillomavirus (HPV): Secondary | ICD-10-CM | POA: Diagnosis not present

## 2023-10-12 NOTE — Telephone Encounter (Signed)
 Call Alver Fisher to get an up dated on pt application ,representative said pt has been APPROVED until 01/2024 after that we have to submitted another application.

## 2023-10-18 ENCOUNTER — Telehealth: Admitting: Family Medicine

## 2023-10-18 ENCOUNTER — Ambulatory Visit: Payer: Self-pay | Admitting: Internal Medicine

## 2023-10-18 DIAGNOSIS — K529 Noninfective gastroenteritis and colitis, unspecified: Secondary | ICD-10-CM | POA: Diagnosis not present

## 2023-10-18 MED ORDER — ONDANSETRON 4 MG PO TBDP: 4.0000 mg | ORAL_TABLET | Freq: Three times a day (TID) | ORAL | 0 refills | Status: AC | PRN

## 2023-10-18 NOTE — Telephone Encounter (Signed)
 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 Patient reports yesterday she began experiencing minor cough, chills, runny nose, headache, moderate diarrhea, vomiting, and abdominal cramping. Patient reports that she is able to keep down fluids. Per protocol, attempted to schedule in office appt. Patient reports she is not able to drive to office and is requesting virtual appt. Virtual scheduled today 3/3. Patient advised to call back with worsening symptoms. Patient verbalized understanding.    Copied from CRM [id number]. Topic: Clinical - Red Word Triage >> Oct 18, 2023  9:57 AM Denese Killings wrote: Red Word that prompted transfer to Nurse Triage: Patient has been sick since yesterday morning. Symptoms- vomitting, diarrhea, really bad headache, fatigue. Reason for Disposition  Abdominal pain  (Exception: Pain clears with each passage of diarrhea stool.)  Answer Assessment - Initial Assessment Questions 1. DIARRHEA SEVERITY: "How bad is the diarrhea?" "How many more stools have you had in the past 24 hours than normal?"    - NO DIARRHEA (SCALE 0)   - MILD (SCALE 1-3): Few loose or mushy BMs; increase of 1-3 stools over normal daily number of stools; mild increase in ostomy output.   -  MODERATE (SCALE 4-7): Increase of 4-6 stools daily over normal; moderate increase in ostomy output.   -  SEVERE (SCALE 8-10; OR "WORST POSSIBLE"): Increase of 7 or more stools daily over normal; moderate increase in ostomy output; incontinence.      4 or 5 times 2. ONSET: "When did the diarrhea begin?"      yesterday 3. BM CONSISTENCY: "How loose or watery is the diarrhea?"      loose 4. VOMITING: "Are you also vomiting?" If Yes, ask: "How many times in the past 24 hours?"      4 or 5 times 5. ABDOMEN PAIN: "Are you having any abdomen pain?" If Yes, ask: "What does it feel like?" (e.g., crampy, dull, intermittent, constant)      cramping 6. ABDOMEN PAIN SEVERITY: If present, ask: "How bad is the pain?"  (e.g., Scale 1-10; mild, moderate, or severe)   - MILD (1-3): doesn't interfere with normal activities, abdomen soft and not tender to touch    - MODERATE (4-7): interferes with normal activities or awakens from sleep, abdomen tender to touch    - SEVERE (8-10): excruciating pain, doubled over, unable to do any normal activities       mild 7. ORAL INTAKE: If vomiting, "Have you been able to drink liquids?" "How much liquids have you had in the past 24 hours?"     Able to drink ginger ale today 8. HYDRATION: "Any signs of dehydration?" (e.g., dry mouth [not just dry lips], too weak to stand, dizziness, new weight loss) "When did you last urinate?"     Dry mouth 9. EXPOSURE: "Have you traveled to a foreign country recently?" "Have you been exposed to anyone with diarrhea?" "Could you have eaten any food that was spoiled?"     I work in nursing homes 10. ANTIBIOTIC USE: "Are you taking antibiotics now or have you taken antibiotics in the past 2 months?"       none  11. OTHER SYMPTOMS: "Do you have any other symptoms?" (e.g., fever, blood in stool)       Cramping, fatigue, headache, vomiting  Protocols used: Diarrhea-A-AH

## 2023-10-18 NOTE — Patient Instructions (Addendum)
 Rhonda [name], thank you for joining Freddy Finner, NP for today's virtual visit.  While this provider is not your primary care provider (PCP), if your PCP is located in our provider database this encounter information will be shared with them immediately following your visit.   A [facility] MyChart account gives you access to today's visit and all your visits, tests, and labs performed at [facility] " click here if you don't have a  MyChart account or go to [email or web address]  Consent: (Patient) Rhonda [name] provided verbal consent for this virtual visit at the beginning of the encounter.  Current Medications:  Current Outpatient Medications:    ondansetron (ZOFRAN-ODT) 4 MG disintegrating tablet, Take 1 tablet (4 mg total) by mouth every 8 (eight) hours as needed for nausea or vomiting., Disp: 20 tablet, Rfl: 0   albuterol (VENTOLIN HFA) 108 (90 Base) MCG/ACT inhaler, Inhale 2 puffs into the lungs every 6 (six) hours as needed., Disp: 6.7 g, Rfl: 1   ALPRAZolam (XANAX) 0.25 MG tablet, Take 1 tablet (0.25 mg total) by mouth 2 (two) times daily as needed for anxiety., Disp: 60 tablet, Rfl: 0   apixaban (ELIQUIS) 5 MG TABS tablet, Take 1 tablet (5 mg total) by mouth 2 (two) times daily., Disp: 180 tablet, Rfl: 1   atorvastatin (LIPITOR) 40 MG tablet, TAKE 1 TABLET(40 MG) BY MOUTH DAILY, Disp: 90 tablet, Rfl: 0   benazepril-hydrochlorthiazide (LOTENSIN HCT) 20-25 MG tablet, Take 1 tablet by mouth daily., Disp: 90 tablet, Rfl: 1   cetirizine (ZYRTEC) 10 MG tablet, Take 10 mg by mouth daily., Disp: , Rfl:    docusate sodium (COLACE) 100 MG capsule, Take 100 mg by mouth daily., Disp: , Rfl:    esomeprazole (NEXIUM) 40 MG capsule, TAKE ONE CAPSULE BY MOUTH EVERY DAY AT 12 NOON., Disp: 90 capsule, Rfl: 1   glucose blood (ONETOUCH ULTRA) test strip, CHECK GLUCOSE ONCE DAILY, Disp: 100 strip, Rfl: 3   Lancet Devices (ONETOUCH DELICA PLUS LANCING)  MISC, 1 each by Does not apply route daily., Disp: 100 each, Rfl: 12   Lancets (ONETOUCH DELICA PLUS LANCET33G) MISC, USE AS DIRECTED ONCE DAILY, Disp: 100 each, Rfl: 3   levothyroxine (SYNTHROID) 100 MCG tablet, TAKE 1 TABLET(100 MCG) BY MOUTH DAILY BEFORE BREAKFAST, Disp: 90 tablet, Rfl: 3   meclizine (ANTIVERT) 50 MG tablet, Take 1 tablet (50 mg total) by mouth 3 (three) times daily as needed., Disp: 30 tablet, Rfl: 0   oxyCODONE (ROXICODONE) 5 MG immediate release tablet, Take 1 tablet (5 mg total) by mouth every 6 (six) hours as needed for breakthrough pain. (Patient not taking: Reported on 08/30/2023), Disp: 6 tablet, Rfl: 0   Peppermint Oil (IBGARD PO), Take 1 tablet by mouth daily., Disp: , Rfl:    Medications ordered in this encounter:  Meds ordered this encounter  Medications   ondansetron (ZOFRAN-ODT) 4 MG disintegrating tablet    Sig: Take 1 tablet (4 mg total) by mouth every 8 (eight) hours as needed for nausea or vomiting.    Dispense:  20 tablet    Refill:  0    Supervising Provider:   Merrilee Jansky [[id number]]     *If you need refills on other medications prior to your next appointment, please contact your pharmacy*  Follow-Up: Call back or seek an in-person evaluation if the symptoms worsen or if the condition fails to improve as anticipated.  [facility] [phone]  Other Instructions Norovirus Infection Norovirus infection  causes inflammation in the stomach and intestines (gastroenteritis) and food poisoning. It is caused by exposure to a virus from a group of similar viruses called noroviruses. Norovirus spreads very easily from person to person (is very contagious). It often occurs in places where people are in close contact, such as schools, nursing homes, restaurants, and cruise ships. You can get it from food, water, surfaces, or other people who have the virus. Norovirus is also found in the stool (feces) or vomit of infected people. You can  spread the infection as soon as you feel sick, and you may continue to be contagious after you recover. What are the causes? This condition is caused by contact with norovirus. You can catch norovirus if you: Eat or drink something that is contaminated with norovirus. Touch surfaces or objects that are contaminated with norovirus and then put your hand in or by your mouth or nose. Have direct contact with an infected person who may or may not still have symptoms. Share food, drink, or utensils with someone who is contagious with norovirus. What are the signs or symptoms? Symptoms usually begin within 12 hours to 2 days after you become infected. Most norovirus symptoms affect the digestive system.Symptoms may include: Nausea, vomiting, and diarrhea. Stomach cramps. Fever. Chills. Headache. Muscle aches and tiredness. How is this diagnosed? This condition may be diagnosed based on: Your symptoms. A physical exam. A stool test. How is this treated? There is no specific treatment for norovirus. Most people get better without treatment in about 2 days. Young children, the elderly, and people who are already sick may take up to 6 days to recover. Follow these instructions at home:  Eating and drinking  Drink plenty of water to replace fluids that are lost through diarrhea and vomiting. This prevents dehydration. Drink enough fluid to keep your urine pale yellow. Drink clear fluids in small amounts as you are able. Clear fluids include water, ice chips, fruit juice with water added (diluted fruit juice), and low-calorie sports drinks. Avoid fluids that contain a lot of sugar or caffeine, such as energy drinks, sports drinks, and soda. Avoid alcohol. If instructed by your health care provider, drink an oral rehydration solution (ORS). This is a drink that is sold at pharmacies and retail stores. An ORS contains minerals (electrolytes) that you can lose through diarrhea and vomiting. Eat  bland, easy-to-digest foods in small amounts as you are able. These foods include rice, lean meats, toast, and crackers. Avoid spicy or fatty foods. General instructions Rest at home while you recover. Do not prepare food for others while you are infected. Wait at least 3 days after you recover from the illness to do this. Take over-the-counter and prescription medicines only as told by your health care provider. Wash your hands frequently with soap and water for at least 20 seconds. Alcohol-based hand sanitizer can be used in addition to soap and water, but sanitizer should not be the only cleansing method because it is not effective at removing norovirus from your hands or surfaces. Make sure that each person in your household washes his or her hands well and often. Keep all follow-up visits. This is important. How is this prevented? To help prevent the spread of norovirus: Stay at home if you are feeling sick. This will reduce the risk of spreading the virus to others. Wash your hands often with soap and water for at least 20 seconds, especially after using the toilet, helping a child use the toilet, or  changing a child's diaper. Wash fruits and vegetables thoroughly before peeling, preparing, or serving them. Throw out any food that a sick person may have touched. Disinfect contaminated surfaces immediately after someone in the household has been sick. Disinfect frequently used surfaces, such as counters, doorknobs, and faucets. Use a bleach-based household cleaner. Immediately remove and wash soiled clothes or sheets. Contact a health care provider if: You have vomiting, diarrhea, or stomach pain that gets worse. You have symptoms that do not go away after 3-6 days. You have a fever. You cannot drink without vomiting. You feel light-headed or dizzy. Your symptoms get worse. Get help right away if: You develop symptoms of dehydration that do not improve with fluid replacement, such  as: Excessive sleepiness. Lack of tears. Very little urine production. Dry mouth. Muscle cramps. Weak pulse. Confusion. Summary Norovirus infection is common and often occurs in places where people are in close contact, such as schools, nursing homes, restaurants, and cruise ships. To help prevent the spread of this infection, wash hands with soap and water for at least 20 seconds before handling food or after having contact with stool or body fluids. There is no specific treatment for norovirus, but most people get better without treatment in about 2 days. People who are healthy when infected often recover sooner than those who are elderly, young, or already sick. Replace lost fluids by drinking plenty of water, or by drinking oral rehydration solution (ORS), which contains important minerals called electrolytes. This prevents dehydration. This information is not intended to replace advice given to you by your health care provider. Make sure you discuss any questions you have with your health care provider. Document Revised: 03/12/2021 Document Reviewed: 03/12/2021 Elsevier Patient Education  2024 Elsevier Inc.   If you have been instructed to have an in-person evaluation today at a local Urgent Care facility, please use the link below. It will take you to a list of all of our available [facility], including address, phone number and hours of operation. Please do not delay care.  [facility]  If you or a family member do not have a primary care provider, use the link below to schedule a visit and establish care. When you choose a [facility] [facility] [employer] primary care physician or advanced practice provider, you gain a long-term partner in health. Find a Primary Care Provider  Learn more about [facility]'s in-office and virtual care options: [facility] - Get Care Now

## 2023-10-18 NOTE — Progress Notes (Signed)
 Virtual Visit Consent   Rhonda [name], you are scheduled for a virtual visit with a [facility] provider today. Just as with appointments in the office, your consent must be obtained to participate. Your consent will be active for this visit and any virtual visit you may have with one of our providers in the next 365 days. If you have a MyChart account, a copy of this consent can be sent to you electronically.  As this is a virtual visit, video technology does not allow for your provider to perform a traditional examination. This may limit your provider's ability to fully assess your condition. If your provider identifies any concerns that need to be evaluated in person or the need to arrange testing (such as labs, EKG, etc.), we will make arrangements to do so. Although advances in technology are sophisticated, we cannot ensure that it will always work on either your end or our end. If the connection with a video visit is poor, the visit may have to be switched to a telephone visit. With either a video or telephone visit, we are not always able to ensure that we have a secure connection.  By engaging in this virtual visit, you consent to the provision of healthcare and authorize for your insurance to be billed (if applicable) for the services provided during this visit. Depending on your insurance coverage, you may receive a charge related to this service.  I need to obtain your verbal consent now. Are you willing to proceed with your visit today? Rhonda [name] has provided verbal consent on 10/18/2023 for a virtual visit (video or telephone). Freddy Finner, NP  Date: 10/18/2023 1:39 PM   Virtual Visit via Video Note   I, Freddy Finner, connected with  Rhonda [name]  ([id number], Jan 08, 1971) on 10/18/23 at  1:30 PM EST by a video-enabled telemedicine application and verified that I am speaking with the correct person using two identifiers.  Location: Patient: Virtual  Visit Location Patient: Home Provider: Virtual Visit Location Provider: Home Office   I discussed the limitations of evaluation and management by telemedicine and the availability of in person appointments. The patient expressed understanding and agreed to proceed.    History of Present Illness: Rhonda [name] is a 53 y.o. 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[employer], [employer] and Covid    Problems:  Patient Active Problem List   Diagnosis Date Noted   Chronic pain of right wrist 08/16/2023   Hypomagnesemia 10/06/2021   Hypercalcemia 10/06/2021   Colon cancer screening    Bilateral carpal tunnel syndrome 01/07/2021   Flexor carpi radialis tendinitis 01/07/2021   Vitamin D deficiency 05/19/2019   IGT (impaired glucose tolerance) 05/19/2019   Exposure to medical diagnostic radiation 02/09/2017   S/P bilateral salpingo-oophorectomy 04/28/2016   Pelvic adhesions 03/26/2016    Class: Present on Admission   History of pulmonary embolism 08/01/2014   CKD (chronic kidney disease) stage 3, GFR 30-59 ml/min (HCC) 02/27/2014   Hypothyroidism 04/28/2012   Hemorrhage of rectum and anus 02/09/2011   Constipation 02/09/2011   Dyslipidemia 08/26/2009   Backache 02/14/2008   Allergic rhinitis 11/15/2007   GERD 06/07/2007  Anxiety state 04/27/2007   Essential hypertension 04/27/2007   Disorder of kidney and ureter 03/17/2007   EMBOLISM/THROMBOSIS, DEEP VSL PRXML LWR EXTRM 03/16/2007    Allergies:  Allergies  Allergen Reactions   Aspirin Hives   Flagyl [Metronidazole] Nausea And Vomiting    With oral flagyl.   Has tolerated vaginal flagyl.     Food Other (See  Comments)    Lima beans--  Positive allergy test    Tramadol Other (See Comments)    Headache    Dilaudid [Hydromorphone] Itching   Medications:  Current Outpatient Medications:    albuterol (VENTOLIN HFA) 108 (90 Base) MCG/ACT inhaler, Inhale 2 puffs into the lungs every 6 (six) hours as needed., Disp: 6.7 g, Rfl: 1   ALPRAZolam (XANAX) 0.25 MG tablet, Take 1 tablet (0.25 mg total) by mouth 2 (two) times daily as needed for anxiety., Disp: 60 tablet, Rfl: 0   apixaban (ELIQUIS) 5 MG TABS tablet, Take 1 tablet (5 mg total) by mouth 2 (two) times daily., Disp: 180 tablet, Rfl: 1   atorvastatin (LIPITOR) 40 MG tablet, TAKE 1 TABLET(40 MG) BY MOUTH DAILY, Disp: 90 tablet, Rfl: 0   benazepril-hydrochlorthiazide (LOTENSIN HCT) 20-25 MG tablet, Take 1 tablet by mouth daily., Disp: 90 tablet, Rfl: 1   cetirizine (ZYRTEC) 10 MG tablet, Take 10 mg by mouth daily., Disp: , Rfl:    docusate sodium (COLACE) 100 MG capsule, Take 100 mg by mouth daily., Disp: , Rfl:    esomeprazole (NEXIUM) 40 MG capsule, TAKE ONE CAPSULE BY MOUTH EVERY DAY AT 12 NOON., Disp: 90 capsule, Rfl: 1   glucose blood (ONETOUCH ULTRA) test strip, CHECK GLUCOSE ONCE DAILY, Disp: 100 strip, Rfl: 3   Lancet Devices (ONETOUCH DELICA PLUS LANCING) MISC, 1 each by Does not apply route daily., Disp: 100 each, Rfl: 12   Lancets (ONETOUCH DELICA PLUS LANCET33G) MISC, USE AS DIRECTED ONCE DAILY, Disp: 100 each, Rfl: 3   levothyroxine (SYNTHROID) 100 MCG tablet, TAKE 1 TABLET(100 MCG) BY MOUTH DAILY BEFORE BREAKFAST, Disp: 90 tablet, Rfl: 3   meclizine (ANTIVERT) 50 MG tablet, Take 1 tablet (50 mg total) by mouth 3 (three) times daily as needed., Disp: 30 tablet, Rfl: 0   ondansetron (ZOFRAN) 4 MG tablet, Take 1 tablet (4 mg total) by mouth every 8 (eight) hours as needed for nausea or vomiting., Disp: 20 tablet, Rfl: 0   ondansetron (ZOFRAN) 8 MG tablet, Take 1 tablet (8 mg total) by mouth every 8 (eight) hours as needed for nausea or  vomiting., Disp: 30 tablet, Rfl: 0   oxyCODONE (ROXICODONE) 5 MG immediate release tablet, Take 1 tablet (5 mg total) by mouth every 6 (six) hours as needed for breakthrough pain. (Patient not taking: Reported on 08/30/2023), Disp: 6 tablet, Rfl: 0   Peppermint Oil (IBGARD PO), Take 1 tablet by mouth daily., Disp: , Rfl:   Observations/Objective: Patient is well-developed, well-nourished in no acute distress.  Resting comfortably  at home.  Head is normocephalic, atraumatic.  No labored breathing.  Speech is clear and coherent with logical content.  Patient is alert and oriented at baseline.    Assessment and Plan:   1. Enteritis (Primary)  - ondansetron (ZOFRAN-ODT) 4 MG disintegrating tablet; Take 1 tablet (4 mg total) by mouth every 8 (eight) hours as needed for nausea or vomiting.  Dispense: 20 tablet; Refill: 0  -possible noro given work setting -OTC management reviewed -follow up if not improved     Reviewed side effects, risks  and benefits of medication.    Patient acknowledged agreement and understanding of the plan.   Past Medical, Surgical, Social History, Allergies, and Medications have been Reviewed.    Follow Up Instructions: I discussed the assessment and treatment plan with the patient. The patient was provided an opportunity to ask questions and all were answered. The patient agreed with the plan and demonstrated an understanding of the instructions.  A copy of instructions were sent to the patient via MyChart unless otherwise noted below.    The patient was advised to call back or seek an in-person evaluation if the symptoms worsen or if the condition fails to improve as anticipated.    Freddy Finner, NP

## 2023-10-20 ENCOUNTER — Encounter: Payer: Self-pay | Admitting: Internal Medicine

## 2023-10-21 ENCOUNTER — Encounter (HOSPITAL_COMMUNITY): Payer: Self-pay

## 2023-10-21 ENCOUNTER — Emergency Department (HOSPITAL_COMMUNITY)
Admission: EM | Admit: 2023-10-21 | Discharge: 2023-10-21 | Disposition: A | Discharge status: Home / Self Care | Attending: Emergency Medicine | Admitting: Emergency Medicine

## 2023-10-21 ENCOUNTER — Other Ambulatory Visit: Payer: Self-pay

## 2023-10-21 DIAGNOSIS — R197 Diarrhea, unspecified: Secondary | ICD-10-CM | POA: Diagnosis not present

## 2023-10-21 DIAGNOSIS — E876 Hypokalemia: Secondary | ICD-10-CM | POA: Diagnosis not present

## 2023-10-21 DIAGNOSIS — R112 Nausea with vomiting, unspecified: Secondary | ICD-10-CM | POA: Diagnosis not present

## 2023-10-21 DIAGNOSIS — Z7901 Long term (current) use of anticoagulants: Secondary | ICD-10-CM | POA: Diagnosis not present

## 2023-10-21 DIAGNOSIS — N189 Chronic kidney disease, unspecified: Secondary | ICD-10-CM | POA: Insufficient documentation

## 2023-10-21 LAB — CBC WITH DIFFERENTIAL/PLATELET
Abs Immature Granulocytes: 0.04 10*3/uL (ref 0.00–0.07)
Basophils Absolute: 0 10*3/uL (ref 0.0–0.1)
Basophils Relative: 1 %
Eosinophils Absolute: 0.1 10*3/uL (ref 0.0–0.5)
Eosinophils Relative: 2 %
HCT: 44 % (ref 36.0–46.0)
Hemoglobin: 13.7 g/dL (ref 12.0–15.0)
Immature Granulocytes: 1 %
Lymphocytes Relative: 39 %
Lymphs Abs: 2.4 10*3/uL (ref 0.7–4.0)
MCH: 27.8 pg (ref 26.0–34.0)
MCHC: 31.1 g/dL (ref 30.0–36.0)
MCV: 89.4 fL (ref 80.0–100.0)
Monocytes Absolute: 0.8 10*3/uL (ref 0.1–1.0)
Monocytes Relative: 13 %
Neutro Abs: 2.9 10*3/uL (ref 1.7–7.7)
Neutrophils Relative %: 44 %
Platelets: 283 10*3/uL (ref 150–400)
RBC: 4.92 MIL/uL (ref 3.87–5.11)
RDW: 13 % (ref 11.5–15.5)
WBC: 6.3 10*3/uL (ref 4.0–10.5)
nRBC: 0 % (ref 0.0–0.2)

## 2023-10-21 LAB — COMPREHENSIVE METABOLIC PANEL
ALT: 31 U/L (ref 0–44)
AST: 30 U/L (ref 15–41)
Albumin: 4 g/dL (ref 3.5–5.0)
Alkaline Phosphatase: 76 U/L (ref 38–126)
Anion gap: 8 (ref 5–15)
BUN: 33 mg/dL — ABNORMAL HIGH (ref 6–20)
CO2: 27 mmol/L (ref 22–32)
Calcium: 10.1 mg/dL (ref 8.9–10.3)
Chloride: 100 mmol/L (ref 98–111)
Creatinine, Ser: 1.49 mg/dL — ABNORMAL HIGH (ref 0.44–1.00)
GFR, Estimated: 41 mL/min — ABNORMAL LOW (ref >60)
Glucose, Bld: 86 mg/dL (ref 70–99)
Potassium: 3.2 mmol/L — ABNORMAL LOW (ref 3.5–5.1)
Sodium: 135 mmol/L (ref 135–145)
Total Bilirubin: 0.8 mg/dL (ref 0.0–1.2)
Total Protein: 8.1 g/dL (ref 6.5–8.1)

## 2023-10-21 LAB — LIPASE, BLOOD: Lipase: 38 U/L (ref 11–51)

## 2023-10-21 MED ORDER — LACTATED RINGERS IV BOLUS
1000.0000 mL | Freq: Once | INTRAVENOUS | Status: AC
  Administered 2023-10-21: 1000 mL via INTRAVENOUS

## 2023-10-21 MED ORDER — ONDANSETRON HCL 4 MG/2ML IJ SOLN
4.0000 mg | Freq: Once | INTRAMUSCULAR | Status: AC
  Administered 2023-10-21: 4 mg via INTRAVENOUS
  Filled 2023-10-21: qty 2

## 2023-10-21 MED ORDER — POTASSIUM CHLORIDE CRYS ER 20 MEQ PO TBCR
40.0000 meq | EXTENDED_RELEASE_TABLET | Freq: Once | ORAL | Status: AC
  Administered 2023-10-21: 40 meq via ORAL
  Filled 2023-10-21: qty 2

## 2023-10-21 MED ORDER — PANTOPRAZOLE SODIUM 40 MG IV SOLR
40.0000 mg | Freq: Once | INTRAVENOUS | Status: AC
  Administered 2023-10-21: 40 mg via INTRAVENOUS
  Filled 2023-10-21: qty 10

## 2023-10-21 MED ORDER — METOCLOPRAMIDE HCL 5 MG/ML IJ SOLN
10.0000 mg | Freq: Once | INTRAMUSCULAR | Status: AC
  Administered 2023-10-21: 10 mg via INTRAVENOUS
  Filled 2023-10-21: qty 2

## 2023-10-21 NOTE — ED Triage Notes (Signed)
 Pt arrived reporting abdominal pain X 3-4 days. Endorses chills and diarrhea. No Blood in stool. Pt afebrile. No other symptoms reported.

## 2023-10-21 NOTE — ED Provider Notes (Signed)
 [place] EMERGENCY DEPARTMENT AT [facility] Provider Note   CSN: [id number] Arrival date & time: 10/21/23  6387     History  Chief Complaint  Patient presents with   Abdominal Pain   Diarrhea        Chills    Anjolaoluwa Siguenza is a 53 y.o. female.  Patient is a 53 year old female who presents with nausea vomiting diarrhea.  She has a history of chronic kidney disease, PE on Eliquis, hyperlipidemia.  She has had a 3 to 4-day history of nausea vomiting diarrhea.  She has some stomach cramping but no specific areas of pain.  No known fevers.  She has had a little bit of rhinorrhea but no congestion or coughing.  No urinary symptoms.  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  Her emesis is nonbilious and nonbloody.  Her diarrhea is watery and nonbloody.       Home Medications Prior to Admission medications   Medication Sig Start Date End Date Taking? Authorizing Provider  albuterol (VENTOLIN HFA) 108 (90 Base) MCG/ACT inhaler Inhale 2 puffs into the lungs every 6 (six) hours as needed. 06/15/22   Philip Aspen, Limmie Patricia, MD  ALPRAZolam Prudy Feeler) 0.25 MG tablet Take 1 tablet (0.25 mg total) by mouth 2 (two) times daily as needed for anxiety. 12/19/19   Philip Aspen, Limmie Patricia, MD  apixaban (ELIQUIS) 5 MG TABS tablet Take 1 tablet (5 mg total) by mouth 2 (two) times daily. 06/23/23   Philip Aspen, Limmie Patricia, MD  atorvastatin (LIPITOR) 40 MG tablet TAKE 1 TABLET(40 MG) BY MOUTH DAILY 08/25/23   Philip Aspen, Limmie Patricia, MD  benazepril-hydrochlorthiazide (LOTENSIN HCT) 20-25 MG tablet Take 1 tablet by mouth daily. 09/23/23   Philip Aspen, Limmie Patricia, MD  cetirizine (ZYRTEC) 10 MG tablet Take 10 mg by mouth daily.    [provider]  docusate sodium (COLACE) 100 MG capsule Take 100 mg by mouth daily.    [provider]  esomeprazole (NEXIUM) 40 MG capsule TAKE ONE CAPSULE BY MOUTH EVERY DAY AT 12 NOON. 05/31/23    Philip Aspen, Limmie Patricia, MD  glucose blood [facility] ULTRA) test strip CHECK GLUCOSE ONCE DAILY 03/12/23   Philip Aspen, Limmie Patricia, MD  Lancet Devices [facility] PLUS LANCING) MISC 1 each by Does not apply route daily. 10/13/21   Philip Aspen, Limmie Patricia, MD  Lancets [facility] DELICA PLUS [place]) MISC USE AS DIRECTED ONCE DAILY 12/15/22   Philip Aspen, Limmie Patricia, MD  levothyroxine (SYNTHROID) 100 MCG tablet TAKE 1 TABLET(100 MCG) BY MOUTH DAILY BEFORE BREAKFAST 04/07/23   Reather Littler, MD  meclizine (ANTIVERT) 50 MG tablet Take 1 tablet (50 mg total) by mouth 3 (three) times daily as needed. 06/25/23   Nafziger, Kandee Keen, NP  ondansetron (ZOFRAN-ODT) 4 MG disintegrating tablet Take 1 tablet (4 mg total) by mouth every 8 (eight) hours as needed for nausea or vomiting. 10/18/23   Freddy Finner, NP  oxyCODONE (ROXICODONE) 5 MG immediate release tablet Take 1 tablet (5 mg total) by mouth every 6 (six) hours as needed for breakthrough pain. Patient not taking: Reported on 08/30/2023 07/03/23   Kommor, Wyn Forster, MD  Peppermint Oil (IBGARD PO) Take 1 tablet by mouth daily.    [provider]      Allergies    Aspirin, Flagyl [metronidazole], Food, Tramadol, and Dilaudid [hydromorphone]    Review of Systems   Review of Systems  Constitutional:  Positive  for fatigue. Negative for chills, diaphoresis and fever.  HENT:  Negative for congestion, rhinorrhea and sneezing.   Eyes: Negative.   Respiratory:  Negative for cough, chest tightness and shortness of breath.   Cardiovascular:  Negative for chest pain and leg swelling.  Gastrointestinal:  Positive for abdominal pain (Intermittent cramping), diarrhea, nausea and vomiting. Negative for blood in stool.  Genitourinary:  Negative for difficulty urinating, flank pain, frequency and hematuria.  Musculoskeletal:  Negative for arthralgias and back pain.  Skin:  Negative for rash.  Neurological:  Positive for light-headedness. Negative for  speech difficulty, weakness, numbness and headaches.    Physical Exam Updated Vital Signs BP 105/78 (BP Location: Left Arm)   Pulse 73   Temp 98.2 F (36.8 C) (Oral)   Resp 16   Ht 5\' 5"  (1.651 m)   Wt 86.6 kg   SpO2 97%   BMI 31.78 kg/m  Physical Exam Constitutional:      Appearance: She is well-developed.  HENT:     Head: Normocephalic and atraumatic.  Eyes:     Pupils: Pupils are equal, round, and reactive to light.  Cardiovascular:     Rate and Rhythm: Normal rate and regular rhythm.     Heart sounds: Normal heart sounds.  Pulmonary:     Effort: Pulmonary effort is normal. No respiratory distress.     Breath sounds: Normal breath sounds. No wheezing or rales.  Chest:     Chest wall: No tenderness.  Abdominal:     General: Bowel sounds are normal.     Palpations: Abdomen is soft.     Tenderness: There is no abdominal tenderness. There is no guarding or rebound.  Musculoskeletal:        General: Normal range of motion.     Cervical back: Normal range of motion and neck supple.  Lymphadenopathy:     Cervical: No cervical adenopathy.  Skin:    General: Skin is warm and dry.     Findings: No rash.  Neurological:     Mental Status: She is alert and oriented to person, place, and time.     ED Results / Procedures / Treatments   Labs (all labs ordered are listed, but only abnormal results are displayed) Labs Reviewed  COMPREHENSIVE METABOLIC PANEL - Abnormal; Notable for the following components:      Result Value   Potassium 3.2 (*)    BUN 33 (*)    Creatinine, Ser 1.49 (*)    GFR, Estimated 41 (*)    All other components within normal limits  LIPASE, BLOOD  CBC WITH DIFFERENTIAL/PLATELET    EKG None  Radiology No results found.  Procedures Procedures    Medications Ordered in ED Medications  lactated ringers bolus 1,000 mL (0 mLs Intravenous Stopped 10/21/23 1157)  ondansetron (ZOFRAN) injection 4 mg (4 mg Intravenous Given 10/21/23 1013)   pantoprazole (PROTONIX) injection 40 mg (40 mg Intravenous Given 10/21/23 1006)  potassium chloride SA (KLOR-CON M) CR tablet 40 mEq (40 mEq Oral Given 10/21/23 1056)  metoCLOPramide (REGLAN) injection 10 mg (10 mg Intravenous Given 10/21/23 1131)  lactated ringers bolus 1,000 mL (1,000 mLs Intravenous New Bag/Given 10/21/23 1208)    ED Course/ Medical Decision Making/ A&P                                 Medical Decision Making Amount and/or Complexity of Data Reviewed Labs: ordered.  Risk  Prescription drug management.   Patient is a 53 year old female who presents with nausea vomiting and diarrhea.  She does not have any abdominal tenderness on exam.  Labs are reviewed.  Her potassium is little low and she was given some oral potassium room placement.  She does not have any symptoms/signs to indicate pancreatitis or hepatitis.  No evidence of bowel obstruction clinically.  She was given IV fluids and antiemetics.  She is feeling much better in the ED and is able to tolerate oral fluids without vomiting.  Repeat abdominal exam was benign.  At this point given her reassuring abdominal exam, I do not feel that CT imaging is indicated.  She does not have any symptoms to indicate a urinary source of her symptoms.  Suspect she likely has viral gastroenteritis.  She was discharged home in good condition.  She says that she has antiemetics at home to use.  Return precautions were given.  Final Clinical Impression(s) / ED Diagnoses Final diagnoses:  Nausea vomiting and diarrhea  Hypokalemia    Rx / DC Orders ED Discharge Orders     None         Rolan Bucco, MD 10/21/23 1221

## 2023-10-21 NOTE — ED Notes (Signed)
 Pt given water for fluid/Po challenge

## 2023-11-15 ENCOUNTER — Encounter: Payer: Self-pay | Admitting: *Deleted

## 2023-11-15 ENCOUNTER — Other Ambulatory Visit (INDEPENDENT_AMBULATORY_CARE_PROVIDER_SITE_OTHER)

## 2023-11-15 DIAGNOSIS — E559 Vitamin D deficiency, unspecified: Secondary | ICD-10-CM | POA: Diagnosis not present

## 2023-11-15 LAB — VITAMIN D 25 HYDROXY (VIT D DEFICIENCY, FRACTURES): VITD: 37.46 ng/mL (ref 30.00–100.00)

## 2023-11-19 ENCOUNTER — Ambulatory Visit (INDEPENDENT_AMBULATORY_CARE_PROVIDER_SITE_OTHER): Admitting: Family Medicine

## 2023-11-19 ENCOUNTER — Encounter: Payer: Self-pay | Admitting: Family Medicine

## 2023-11-19 VITALS — BP 108/78 | HR 55 | Temp 98.7°F | Wt 188.0 lb

## 2023-11-19 DIAGNOSIS — M5442 Lumbago with sciatica, left side: Secondary | ICD-10-CM

## 2023-11-19 DIAGNOSIS — D734 Cyst of spleen: Secondary | ICD-10-CM

## 2023-11-19 DIAGNOSIS — G8929 Other chronic pain: Secondary | ICD-10-CM | POA: Diagnosis not present

## 2023-11-19 MED ORDER — METHYLPREDNISOLONE ACETATE 40 MG/ML IJ SUSP
40.0000 mg | Freq: Once | INTRAMUSCULAR | Status: AC
  Administered 2023-11-19: 40 mg via INTRAMUSCULAR

## 2023-11-19 MED ORDER — METHYLPREDNISOLONE ACETATE 80 MG/ML IJ SUSP
80.0000 mg | Freq: Once | INTRAMUSCULAR | Status: AC
  Administered 2023-11-19: 80 mg via INTRAMUSCULAR

## 2023-11-19 NOTE — Progress Notes (Signed)
   Subjective:    Patient ID: Rhonda [name], female    DOB: [birth year], 53 y.o.   MRN: [id number]  HPI Here for an acute flare of her low back pain. She has chronic left sided low back pain, and sometimes this radiates down the left leg. She has had 5 days now of severe left sided low back pain with pain in the left leg. She also has weakness in the left leg. She is using Tylenol, heat, ice, and stretches. No hx of trauma.    Review of Systems  Constitutional: Negative.   Respiratory: Negative.    Cardiovascular: Negative.   Musculoskeletal:  Positive for back pain.  Neurological:  Positive for weakness.       Objective:   Physical Exam Constitutional:      Comments: She walks with a slight limp. She is in pain   Cardiovascular:     Rate and Rhythm: Normal rate and regular rhythm.     Pulses: Normal pulses.     Heart sounds: Normal heart sounds.  Pulmonary:     Effort: Pulmonary effort is normal.     Breath sounds: Normal breath sounds.  Musculoskeletal:     Comments: She is quite tender in the left lower back and over the left sciatic notch. ROM is full but she has a lot of pain with full flexion and extension  Neurological:     Mental Status: She is alert.           Assessment & Plan:  Acute exacerbation of chronic left low back pain with sciatica. We will treat with a Medrol dose pack. Set up a lumbar MRI.  Gershon Crane, MD

## 2023-11-23 ENCOUNTER — Other Ambulatory Visit: Payer: Self-pay | Admitting: Internal Medicine

## 2023-11-23 DIAGNOSIS — E785 Hyperlipidemia, unspecified: Secondary | ICD-10-CM

## 2023-11-26 ENCOUNTER — Encounter: Payer: Self-pay | Admitting: Internal Medicine

## 2023-11-27 ENCOUNTER — Other Ambulatory Visit: Payer: Self-pay | Admitting: Internal Medicine

## 2023-11-29 ENCOUNTER — Telehealth: Payer: Self-pay

## 2023-11-29 NOTE — Progress Notes (Signed)
   11/29/2023  Patient ID: Rhonda [name], female   DOB: 1971-04-06, 53 y.o.   MRN: [id number]  Contacted patient via telephone to follow up on Eliquis concern. Company states they are missing the prescriber's page of the 2025 renewal application.  Explained that PCP is out of office this week but patient confirms she has a 60DS of medication on hand. Will discuss renewal with PCP when she returns to office.  Carnell Christian, PharmD Clinical Pharmacist [phone]

## 2023-11-30 ENCOUNTER — Other Ambulatory Visit: Payer: Self-pay | Admitting: *Deleted

## 2023-11-30 MED ORDER — APIXABAN 5 MG PO TABS: 5.0000 mg | ORAL_TABLET | Freq: Two times a day (BID) | ORAL | 1 refills | Status: DC

## 2023-12-06 ENCOUNTER — Ambulatory Visit (HOSPITAL_BASED_OUTPATIENT_CLINIC_OR_DEPARTMENT_OTHER): Admitting: Nurse Practitioner

## 2023-12-08 ENCOUNTER — Telehealth: Payer: Self-pay

## 2023-12-08 NOTE — Progress Notes (Signed)
   12/08/2023  Patient ID: Rhonda [name], female   DOB: [birth year], 53 y.o.   MRN: [id number]  Received notice from patient that her Eliquis  through [employer] needed renewal. Patient reports she submitted her portion but company still needs provider portion.  PCP completed and submitted.  Carnell Christian, PharmD Clinical Pharmacist [phone]

## 2023-12-09 ENCOUNTER — Ambulatory Visit
Admission: RE | Admit: 2023-12-09 | Discharge: 2023-12-09 | Disposition: A | Discharge status: Home / Self Care | Payer: Self-pay | Source: Ambulatory Visit | Attending: Family Medicine | Admitting: Family Medicine

## 2023-12-09 DIAGNOSIS — G8929 Other chronic pain: Secondary | ICD-10-CM

## 2023-12-20 ENCOUNTER — Telehealth: Payer: Self-pay

## 2023-12-20 NOTE — Progress Notes (Addendum)
   12/20/2023  Patient ID: Rhonda [name], female   DOB: 1971-01-29, 53 y.o.   MRN: [id number]  Received fax from [employer] Eliquis  Assistance company that there was missing information on the application. Contacted company and they state that the household size needs to be provided by the applicant.  Attempted to contact patient to make her aware and instruct her to call the company to provide the missing info [phone]). Had to leave a voicemail.   Patient returned call, communicated above info, patient will contact company today.  Carnell Christian, PharmD Clinical Pharmacist [phone]

## 2023-12-21 ENCOUNTER — Encounter: Payer: Self-pay | Admitting: Internal Medicine

## 2023-12-21 ENCOUNTER — Ambulatory Visit (INDEPENDENT_AMBULATORY_CARE_PROVIDER_SITE_OTHER): Payer: Self-pay | Admitting: Internal Medicine

## 2023-12-21 VITALS — BP 118/80 | HR 93 | Temp 98.3°F | Ht 65.0 in | Wt 190.4 lb

## 2023-12-21 DIAGNOSIS — R519 Headache, unspecified: Secondary | ICD-10-CM

## 2023-12-21 MED ORDER — MELOXICAM 7.5 MG PO TABS: 7.5000 mg | ORAL_TABLET | Freq: Every day | ORAL | 0 refills | Status: DC

## 2023-12-21 NOTE — Progress Notes (Signed)
 Established Patient Office Visit     CC/Reason for Visit: Headache  HPI: Rhonda [name] is a 53 y.o. female who is coming in today for the above mentioned reasons. Past Medical History is significant for: Hypertension, hyperlipidemia, impaired glucose tolerance, hypothyroidism, chronic kidney disease stage II and vitamin D  deficiency.  For the past 3 days she has been experiencing a throbbing headache around her forehead and posterior head.  This year she has had bad seasonal allergies with lots of postnasal drip and nasal congestion.  She sometimes feels lightheaded when she bends over to pick things off the floor.  She has been checking her blood pressure and while her systolics have been within range, her diastolic blood pressure has been in the upper 80s to low 90s.   Past Medical/Surgical History: Past Medical History:  Diagnosis Date   Anxiety    C1q nephropathy    Cancer (HCC)    history of thyroid  cancer   Chronic constipation    CKD (chronic kidney disease), stage II nephrologist-  dr Harland Libman [facility])   secondary to C1q nephropathy/  hypertension   Family history of adverse reaction to anesthesia    father hard to put down for anesthesia   GERD (gastroesophageal reflux disease)    H/O radioactive iodine thyroid  ablation    1992  right throid   History of exercise intolerance    [id number]  ETT--  normal w/ no ischemia per cardiologist (dr Mitzie Anda) epic note   History of pulmonary embolus (PE)    07/ 2008-- treated w/ coumadin   History of syncope    recurrent --  workup done per epic -- dx orthostatic hypotension   Hyperlipidemia    Hypertension    IBS (irritable bowel syndrome)    Neuromuscular disorder (HCC)    carpal tunnel   Pelvic pain in female    Post-surgical hypothyroidism    1991 left thyroidectomy   Pre-diabetes    Proteinuria    Seasonal and perennial allergic rhinitis    Wears contact lenses    Wears glasses      Past Surgical History:  Procedure Laterality Date   64 HOUR PH STUDY N/A 11/16/2016   Procedure: 24 HOUR PH STUDY;  Surgeon: Albertina Hugger, MD;  Location: [facility];  Service: Gastroenterology;  Laterality: N/A;   ABDOMINAL HYSTERECTOMY  1998   COLONOSCOPY     COLONOSCOPY WITH PROPOFOL  N/A 08/05/2021   Procedure: COLONOSCOPY WITH PROPOFOL ;  Surgeon: Daina Drum, MD;  Location: [facility];  Service: Gastroenterology;  Laterality: N/A;   CYSTOSCOPY N/A 04/28/2016   Procedure: CYSTOSCOPY;  Surgeon: Merryl Abraham, MD;  Location: [facility];  Service: Gynecology;  Laterality: N/A;   ESOPHAGEAL MANOMETRY N/A 11/16/2016   Procedure: ESOPHAGEAL MANOMETRY (EM);  Surgeon: Albertina Hugger, MD;  Location: [facility];  Service: Gastroenterology;  Laterality: N/A;   ESOPHAGOGASTRODUODENOSCOPY     LAPAROSCOPIC CHOLECYSTECTOMY  1997   LAPAROSCOPY N/A 03/26/2016   Procedure: LAPAROSCOPY DIAGNOSTIC, LYSIS OF ADHESIONS;  Surgeon: Merryl Abraham, MD;  Location: [facility] [place];  Service: Gynecology;  Laterality: N/A;   LAPAROTOMY N/A 04/28/2016   Procedure: EXPLORATORY LAPAROTOMY;  Surgeon: Merryl Abraham, MD;  Location: [facility];  Service: Gynecology;  Laterality: N/A;   RENAL BIOPSY     SALPINGOOPHORECTOMY Bilateral 04/28/2016   Procedure: Bilateral OOPHORECTOMY;  Surgeon: Merryl Abraham, MD;  Location: [facility];  Service: Gynecology;  Laterality: Bilateral;   THYROIDECTOMY Left 1991  TRANSTHORACIC ECHOCARDIOGRAM  04/27/2012   normal LV, ef 60-65%/  trivial MR   TUBAL LIGATION  1992    Social History:  reports that she has never smoked. She has never used smokeless tobacco. She reports that she does not drink alcohol and does not use drugs.  Allergies: Allergies  Allergen Reactions   Aspirin  Hives   Flagyl  [Metronidazole ] Nausea And Vomiting    With oral flagyl .   Has tolerated vaginal flagyl .     Food Other (See Comments)    Lima beans--  Positive allergy test    Tramadol  Other  (See Comments)    Headache    Dilaudid  [Hydromorphone ] Itching    Family History:  Family History  Problem Relation Age of Onset   Diabetes Mother        Pre-diabetic   Hypertension Mother    Hypertension Father    Coronary artery disease Father    Diabetes Father    Stroke Father    Prostate cancer Father    Breast cancer Maternal Aunt    Thyroid  disease Maternal Aunt    Colon cancer Neg Hx    Colon polyps Neg Hx    Kidney disease Neg Hx    Esophageal cancer Neg Hx    Gallbladder disease Neg Hx    Stomach cancer Neg Hx    Rectal cancer Neg Hx      Current Outpatient Medications:    albuterol  (VENTOLIN  HFA) 108 (90 Base) MCG/ACT inhaler, Inhale 2 puffs into the lungs every 6 (six) hours as needed., Disp: 6.7 g, Rfl: 1   ALPRAZolam  (XANAX ) 0.25 MG tablet, Take 1 tablet (0.25 mg total) by mouth 2 (two) times daily as needed for anxiety., Disp: 60 tablet, Rfl: 0   apixaban  (ELIQUIS ) 5 MG TABS tablet, Take 1 tablet (5 mg total) by mouth 2 (two) times daily., Disp: 180 tablet, Rfl: 1   atorvastatin  (LIPITOR) 40 MG tablet, TAKE 1 TABLET(40 MG) BY MOUTH DAILY, Disp: 90 tablet, Rfl: 1   benazepril -hydrochlorthiazide (LOTENSIN  HCT) 20-25 MG tablet, Take 1 tablet by mouth daily., Disp: 90 tablet, Rfl: 1   cetirizine (ZYRTEC) 10 MG tablet, Take 10 mg by mouth daily., Disp: , Rfl:    docusate sodium  (COLACE) 100 MG capsule, Take 100 mg by mouth daily., Disp: , Rfl:    esomeprazole  (NEXIUM ) 40 MG capsule, TAKE ONE CAPSULE BY MOUTH EVERY DAY AT 12 NOON., Disp: 90 capsule, Rfl: 1   glucose blood (ONETOUCH ULTRA) test strip, CHECK GLUCOSE ONCE DAILY, Disp: 100 strip, Rfl: 3   Lancet Devices (ONETOUCH DELICA PLUS LANCING) MISC, 1 each by Does not apply route daily., Disp: 100 each, Rfl: 12   Lancets (ONETOUCH DELICA PLUS LANCET33G) MISC, USE AS DIRECTED ONCE DAILY, Disp: 100 each, Rfl: 3   levothyroxine  (SYNTHROID ) 100 MCG tablet, TAKE 1 TABLET(100 MCG) BY MOUTH DAILY BEFORE BREAKFAST, Disp:  90 tablet, Rfl: 3   meclizine  (ANTIVERT ) 50 MG tablet, Take 1 tablet (50 mg total) by mouth 3 (three) times daily as needed., Disp: 30 tablet, Rfl: 0   meloxicam (MOBIC) 7.5 MG tablet, Take 1 tablet (7.5 mg total) by mouth daily., Disp: 10 tablet, Rfl: 0   ondansetron  (ZOFRAN -ODT) 4 MG disintegrating tablet, Take 1 tablet (4 mg total) by mouth every 8 (eight) hours as needed for nausea or vomiting., Disp: 20 tablet, Rfl: 0   Peppermint Oil (IBGARD PO), Take 1 tablet by mouth daily., Disp: , Rfl:    oxyCODONE  (ROXICODONE ) 5 MG immediate release  tablet, Take 1 tablet (5 mg total) by mouth every 6 (six) hours as needed for breakthrough pain. (Patient not taking: Reported on 12/21/2023), Disp: 6 tablet, Rfl: 0  Review of Systems:  Negative unless indicated in HPI.   Physical Exam: Vitals:   12/21/23 1559  BP: 118/80  Pulse: 93  Temp: 98.3 F (36.8 C)  TempSrc: Oral  SpO2: 97%  Weight: 190 lb 6.4 oz (86.4 kg)  Height: 5\' 5"  (1.651 m)    Body mass index is 31.68 kg/m.   Physical Exam Vitals reviewed.  Constitutional:      Appearance: Normal appearance.  HENT:     Head: Normocephalic and atraumatic.  Eyes:     Conjunctiva/sclera: Conjunctivae normal.  Cardiovascular:     Rate and Rhythm: Normal rate and regular rhythm.  Pulmonary:     Effort: Pulmonary effort is normal.     Breath sounds: Normal breath sounds.  Skin:    General: Skin is warm and dry.  Neurological:     General: No focal deficit present.     Mental Status: She is alert and oriented to person, place, and time.  Psychiatric:        Mood and Affect: Mood normal.        Behavior: Behavior normal.        Thought Content: Thought content normal.        Judgment: Judgment normal.      Impression and Plan:  Sinus headache -     Meloxicam; Take 1 tablet (7.5 mg total) by mouth daily.  Dispense: 10 tablet; Refill: 0  -I suspect this to be a sinus headache.  Have advised daily antihistamines, Mucinex and will  give 10 days of meloxicam for pain control.  I have also asked her to monitor blood pressures as high diastolics can also be contributing to headache.  She will schedule follow-up for annual physical.   Time spent:30 minutes reviewing chart, interviewing and examining patient and formulating plan of care.     Marguerita Shih, MD [facility]

## 2023-12-22 ENCOUNTER — Encounter: Payer: Self-pay | Admitting: Internal Medicine

## 2024-01-03 ENCOUNTER — Ambulatory Visit: Payer: Self-pay | Admitting: Internal Medicine

## 2024-01-04 ENCOUNTER — Ambulatory Visit (INDEPENDENT_AMBULATORY_CARE_PROVIDER_SITE_OTHER): Payer: Self-pay | Admitting: Gastroenterology

## 2024-01-04 ENCOUNTER — Encounter: Payer: Self-pay | Admitting: Gastroenterology

## 2024-01-04 VITALS — BP 118/70 | HR 67 | Ht 65.0 in | Wt 188.0 lb

## 2024-01-04 DIAGNOSIS — R109 Unspecified abdominal pain: Secondary | ICD-10-CM | POA: Diagnosis not present

## 2024-01-04 DIAGNOSIS — Z8719 Personal history of other diseases of the digestive system: Secondary | ICD-10-CM

## 2024-01-04 DIAGNOSIS — R197 Diarrhea, unspecified: Secondary | ICD-10-CM | POA: Diagnosis not present

## 2024-01-04 DIAGNOSIS — K582 Mixed irritable bowel syndrome: Secondary | ICD-10-CM

## 2024-01-04 DIAGNOSIS — R1030 Lower abdominal pain, unspecified: Secondary | ICD-10-CM

## 2024-01-04 MED ORDER — DICYCLOMINE HCL 10 MG PO CAPS: 10.0000 mg | ORAL_CAPSULE | Freq: Three times a day (TID) | ORAL | 2 refills | Status: DC

## 2024-01-04 NOTE — Addendum Note (Signed)
 Addended by: Corita Diego A on: 01/04/2024 12:38 PM   Modules accepted: Orders

## 2024-01-04 NOTE — Progress Notes (Signed)
 Chief Complaint: Diarrhea Primary GI MD: Dr. Dominic Friendly  HPI: 53 year old female with medical history as listed below presents for acute onset abdominal cramping and altered bowel habits   10/2016 EGD was normal.    03/21/2019 patient seen in clinic by Dr. Dominic Friendly.  At that time discussed being diagnosed with IBS with constipation as well as heartburn, epigastric and chest fullness after meals.  Little improvement on multiple PPIs over the years, April 2018 pH impedance and esophageal manometry off acid suppression with no reflux.  Gastric emptying studies in 2012 and 2008 were normal.  That office visit discussed she was feeling similar with chronic nausea and intermittent feelings of regurgitation and heartburn.  Alternating constipation/diarrhea.  That time diagnosed with longstanding functional bowel symptoms that had flared recently.  She was given a trial of Zofran  8 mg up to 3 times a day as needed.  Also given samples of IBgard 2 capsules twice daily.  Discussed possible trial of buspirone  if this did not work.    03/28/2020 patient called in with same symptoms.  She was started on Buspirone  15 mg.  Instructed start with half tablet at bedtime and then increase to whole tablet at bedtime if no side effects.    08/05/2021 colonoscopy with a few erosions in the cecum, nonbleeding internal hemorrhoids and otherwise normal.  Repeat recommended in 10 years.    02/13/2022 CT head without contrast with no acute abnormality.     11/2022 seen by Reginal Capra, PA for IBS flare and given IB gard and dicyclomine   Discussed the use of AI scribe software for clinical note transcription with the patient, who gave verbal consent to proceed.  History of Present Illness Approximately two weeks ago, she experienced a sudden onset of abdominal cramping at work, described as 'bubble guts', leading to an urgent need to use the bathroom. Since then, she has persistent cramping that causes her to double over in pain,  occurring even with food intake and not relieved by bowel movements.  The cramping is accompanied by nausea and initially presented with diarrhea, which has since transitioned to softer stools. She experiences chills but no fever. She has not been on antibiotics in the past six months to a year and has not been around anyone with similar symptoms. Prior to this episode, her bowel movements were normal, although she has a history of chronic constipation.  She was previously prescribed Ibogard and dicyclomine  for diarrhea and bloating, but she is not currently taking dicyclomine .    Past Medical History:  Diagnosis Date   Anxiety    C1q nephropathy    Cancer (HCC)    history of thyroid  cancer   Chronic constipation    CKD (chronic kidney disease), stage II nephrologist-  dr Harland Libman [facility])   secondary to C1q nephropathy/  hypertension   Family history of adverse reaction to anesthesia    father hard to put down for anesthesia   GERD (gastroesophageal reflux disease)    H/O radioactive iodine thyroid  ablation    1992  right throid   History of exercise intolerance    01-052011  ETT--  normal w/ no ischemia per cardiologist (dr Mitzie Anda) epic note   History of pulmonary embolus (PE)    07/ 2008-- treated w/ coumadin   History of syncope    recurrent --  workup done per epic -- dx orthostatic hypotension   Hyperlipidemia    Hypertension    IBS (irritable bowel syndrome)  Neuromuscular disorder (HCC)    carpal tunnel   Pelvic pain in female    Post-surgical hypothyroidism    1991 left thyroidectomy   Pre-diabetes    Proteinuria    Seasonal and perennial allergic rhinitis    Wears contact lenses    Wears glasses     Past Surgical History:  Procedure Laterality Date   43 HOUR PH STUDY N/A 11/16/2016   Procedure: 24 HOUR PH STUDY;  Surgeon: Albertina Hugger, MD;  Location: [facility];  Service: Gastroenterology;  Laterality: N/A;   ABDOMINAL  HYSTERECTOMY  1998   COLONOSCOPY     COLONOSCOPY WITH PROPOFOL  N/A 08/05/2021   Procedure: COLONOSCOPY WITH PROPOFOL ;  Surgeon: Daina Drum, MD;  Location: [facility];  Service: Gastroenterology;  Laterality: N/A;   CYSTOSCOPY N/A 04/28/2016   Procedure: CYSTOSCOPY;  Surgeon: Merryl Abraham, MD;  Location: [facility];  Service: Gynecology;  Laterality: N/A;   ESOPHAGEAL MANOMETRY N/A 11/16/2016   Procedure: ESOPHAGEAL MANOMETRY (EM);  Surgeon: Albertina Hugger, MD;  Location: [facility];  Service: Gastroenterology;  Laterality: N/A;   ESOPHAGOGASTRODUODENOSCOPY     LAPAROSCOPIC CHOLECYSTECTOMY  1997   LAPAROSCOPY N/A 03/26/2016   Procedure: LAPAROSCOPY DIAGNOSTIC, LYSIS OF ADHESIONS;  Surgeon: Merryl Abraham, MD;  Location: [facility] [place];  Service: Gynecology;  Laterality: N/A;   LAPAROTOMY N/A 04/28/2016   Procedure: EXPLORATORY LAPAROTOMY;  Surgeon: Merryl Abraham, MD;  Location: [facility];  Service: Gynecology;  Laterality: N/A;   RENAL BIOPSY     SALPINGOOPHORECTOMY Bilateral 04/28/2016   Procedure: Bilateral OOPHORECTOMY;  Surgeon: Merryl Abraham, MD;  Location: [facility];  Service: Gynecology;  Laterality: Bilateral;   THYROIDECTOMY Left 1991   TRANSTHORACIC ECHOCARDIOGRAM  04/27/2012   normal LV, ef 60-65%/  trivial MR   TUBAL LIGATION  1992    Current Outpatient Medications  Medication Sig Dispense Refill   albuterol  (VENTOLIN  HFA) 108 (90 Base) MCG/ACT inhaler Inhale 2 puffs into the lungs every 6 (six) hours as needed. 6.7 g 1   ALPRAZolam  (XANAX ) 0.25 MG tablet Take 1 tablet (0.25 mg total) by mouth 2 (two) times daily as needed for anxiety. 60 tablet 0   apixaban  (ELIQUIS ) 5 MG TABS tablet Take 1 tablet (5 mg total) by mouth 2 (two) times daily. 180 tablet 1   atorvastatin  (LIPITOR) 40 MG tablet TAKE 1 TABLET(40 MG) BY MOUTH DAILY 90 tablet 1   benazepril -hydrochlorthiazide (LOTENSIN  HCT) 20-25 MG tablet Take 1 tablet by mouth daily. 90 tablet 1   cetirizine (ZYRTEC) 10 MG  tablet Take 10 mg by mouth daily.     dicyclomine  (BENTYL ) 10 MG capsule Take 1 capsule (10 mg total) by mouth 3 (three) times daily before meals. 90 capsule 2   docusate sodium  (COLACE) 100 MG capsule Take 100 mg by mouth daily.     esomeprazole  (NEXIUM ) 40 MG capsule TAKE ONE CAPSULE BY MOUTH EVERY DAY AT 12 NOON. 90 capsule 1   glucose blood (ONETOUCH ULTRA) test strip CHECK GLUCOSE ONCE DAILY 100 strip 3   Lancet Devices (ONETOUCH DELICA PLUS LANCING) MISC 1 each by Does not apply route daily. 100 each 12   Lancets (ONETOUCH DELICA PLUS LANCET33G) MISC USE AS DIRECTED ONCE DAILY 100 each 3   levothyroxine  (SYNTHROID ) 100 MCG tablet TAKE 1 TABLET(100 MCG) BY MOUTH DAILY BEFORE BREAKFAST 90 tablet 3   meclizine  (ANTIVERT ) 50 MG tablet Take 1 tablet (50 mg total) by mouth 3 (three) times daily as needed. 30 tablet 0  ondansetron  (ZOFRAN -ODT) 4 MG disintegrating tablet Take 1 tablet (4 mg total) by mouth every 8 (eight) hours as needed for nausea or vomiting. 20 tablet 0   Peppermint Oil (IBGARD PO) Take 1 tablet by mouth daily.     meloxicam  (MOBIC ) 7.5 MG tablet Take 1 tablet (7.5 mg total) by mouth daily. (Patient not taking: Reported on 01/04/2024) 10 tablet 0   oxyCODONE  (ROXICODONE ) 5 MG immediate release tablet Take 1 tablet (5 mg total) by mouth every 6 (six) hours as needed for breakthrough pain. (Patient not taking: Reported on 01/04/2024) 6 tablet 0   No current facility-administered medications for this visit.    Allergies as of 01/04/2024 - Review Complete 01/04/2024  Allergen Reaction Noted   Aspirin  Hives    Flagyl  [metronidazole ] Nausea And Vomiting 06/20/2013   Food Other (See Comments) 10/02/2011   Tramadol  Other (See Comments) 10/02/2011   Dilaudid  [hydromorphone ] Itching 07/01/2014    Family History  Problem Relation Age of Onset   Diabetes Mother        Pre-diabetic   Hypertension Mother    Hypertension Father    Coronary artery disease Father    Diabetes Father     Stroke Father    Prostate cancer Father    Breast cancer Maternal Aunt    Thyroid  disease Maternal Aunt    Colon cancer Neg Hx    Colon polyps Neg Hx    Kidney disease Neg Hx    Esophageal cancer Neg Hx    Gallbladder disease Neg Hx    Stomach cancer Neg Hx    Rectal cancer Neg Hx     Social History   Socioeconomic History   Marital status: Married    Spouse name: Not on file   Number of children: 2   Years of education: Not on file   Highest education level: 12th grade  Occupational History   Occupation: NURSING ASSISTANT    Employer: [employer]  Tobacco Use   Smoking status: Never   Smokeless tobacco: Never  Vaping Use   Vaping status: Never Used  Substance and Sexual Activity   Alcohol use: No    Alcohol/week: 0.0 standard drinks of alcohol   Drug use: No   Sexual activity: Yes    Birth control/protection: Surgical  Other Topics Concern   Not on file  Social History Narrative   She is a Lawyer   Social Drivers of Corporate investment banker Strain: Low Risk  (11/18/2023)   Overall Financial Resource Strain (CARDIA)    Difficulty of Paying Living Expenses: Not hard at all  Food Insecurity: No Food Insecurity (11/18/2023)   Hunger Vital Sign    Worried About Running Out of Food in the Last Year: Never true    Ran Out of Food in the Last Year: Never true  Transportation Needs: No Transportation Needs (11/18/2023)   PRAPARE - Administrator, Civil Service (Medical): No    Lack of Transportation (Non-Medical): No  Physical Activity: Insufficiently Active (11/18/2023)   Exercise Vital Sign    Days of Exercise per Week: 2 days    Minutes of Exercise per Session: 10 min  Stress: No Stress Concern Present (11/18/2023)   [employer] of Occupational Health - Occupational Stress Questionnaire    Feeling of Stress : Only a little  Social Connections: Moderately Integrated (11/18/2023)   Social Connection and Isolation Panel [NHANES]    Frequency of  Communication with Friends and Family: Twice a week  Frequency of Social Gatherings with Friends and Family: Once a week    Attends Religious Services: More than 4 times per year    Active Member of [employer] or Organizations: No    Attends Engineer, structural: Not on file    Marital Status: Married  Catering manager Violence: Not on file    Review of Systems:    Constitutional: No weight loss, fever, chills, weakness or fatigue HEENT: Eyes: No change in vision               Ears, Nose, Throat:  No change in hearing or congestion Skin: No rash or itching Cardiovascular: No chest pain, chest pressure or palpitations   Respiratory: No SOB or cough Gastrointestinal: See HPI and otherwise negative Genitourinary: No dysuria or change in urinary frequency Neurological: No headache, dizziness or syncope Musculoskeletal: No new muscle or joint pain Hematologic: No bleeding or bruising Psychiatric: No history of depression or anxiety    Physical Exam:  Vital signs: BP 118/70   Pulse 67   Ht 5\' 5"  (1.651 m)   Wt 188 lb (85.3 kg)   BMI 31.28 kg/m   Constitutional: NAD, alert and cooperative Head:  Normocephalic and atraumatic. Eyes:   PEERL, EOMI. No icterus. Conjunctiva pink. Respiratory: Respirations even and unlabored. Lungs clear to auscultation bilaterally.   No wheezes, crackles, or rhonchi.  Cardiovascular:  Regular rate and rhythm. No peripheral edema, cyanosis or pallor.  Gastrointestinal:  Soft, nondistended, nontender. No rebound or guarding. Normal bowel sounds. No appreciable masses or hepatomegaly. Rectal:  Declines Msk:  Symmetrical without gross deformities. Without edema, no deformity or joint abnormality.  Neurologic:  Alert and  oriented x4;  grossly normal neurologically.  Skin:   Dry and intact without significant lesions or rashes. Psychiatric: Oriented to person, place and time. Demonstrates good judgement and reason without abnormal affect or  behaviors.   RELEVANT LABS AND IMAGING: CBC    Component Value Date/Time   WBC 6.3 10/21/2023 0945   RBC 4.92 10/21/2023 0945   HGB 13.7 10/21/2023 0945   HCT 44.0 10/21/2023 0945   PLT 283 10/21/2023 0945   MCV 89.4 10/21/2023 0945   MCH 27.8 10/21/2023 0945   MCHC 31.1 10/21/2023 0945   RDW 13.0 10/21/2023 0945   LYMPHSABS 2.4 10/21/2023 0945   MONOABS 0.8 10/21/2023 0945   EOSABS 0.1 10/21/2023 0945   BASOSABS 0.0 10/21/2023 0945    CMP     Component Value Date/Time   NA 135 10/21/2023 0945   K 3.2 (L) 10/21/2023 0945   CL 100 10/21/2023 0945   CO2 27 10/21/2023 0945   GLUCOSE 86 10/21/2023 0945   BUN 33 (H) 10/21/2023 0945   CREATININE 1.49 (H) 10/21/2023 0945   CALCIUM  10.1 10/21/2023 0945   PROT 8.1 10/21/2023 0945   ALBUMIN  4.0 10/21/2023 0945   AST 30 10/21/2023 0945   ALT 31 10/21/2023 0945   ALKPHOS 76 10/21/2023 0945   BILITOT 0.8 10/21/2023 0945   GFRNONAA 41 (L) 10/21/2023 0945   GFRAA 49 (L) 08/30/2019 1243     Assessment/Plan:   History of IBS-C Acute Diarrhea Normal gastric emptying 2008 and 2012. Colonoscopy 2022 with few erosions in cecum, nonbleeding hemorrhoids, otherwise normal with 10 year repeat. CT head normal 2023. Previous trial with IB gard and dicyclomine  for IBS-C seen by Dr. Dominic Friendly and Reginal Capra. Previous history of IBS with resolution. Now having acute onset diarrhea with continued loose stools over the last two weeks  associated with abdominal pain and chills. Was having soft formed stools prior. 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

## 2024-01-04 NOTE — Patient Instructions (Signed)
 We have sent the following medications to your pharmacy for you to pick up at your convenience:  Bentyl   Your provider has ordered "Diatherix" stool testing for you. You have received a kit from our office today containing all necessary supplies to complete this test. Please carefully read the stool collection instructions provided in the kit before opening the accompanying materials. In addition, be sure there is a label providing your full name and date of birth on the "puritan opti-swab" tube that is supplied in the kit (if you do not see a label with this information on your test tube, please make us  aware before test collection!). After completing the test, you should secure the purtian tube into the specimen biohazard bag. The [facility] E-Req sheet (including date and time of specimen collection) should be placed into the outside pocket of the specimen biohazard bag and returned to the [place] lab (basement floor of [facility]) within 3 days of collection. Please make sure to give the specimen to a staff member at the lab. DO NOT leave the specimen on the counter.   If the specimen date and time (can be found in the upper right boxed portion of the sheet) are not filled out on the E-Req sheet, the test will NOT be performed.

## 2024-01-06 NOTE — Progress Notes (Signed)
 ____________________________________________________________  Attending physician addendum:  Thank you for sending this case to me. I have reviewed the entire note and agree with the plan.   Amada Jupiter, MD  ____________________________________________________________

## 2024-01-07 ENCOUNTER — Ambulatory Visit
Admission: RE | Admit: 2024-01-07 | Discharge: 2024-01-07 | Disposition: A | Discharge status: Home / Self Care | Source: Ambulatory Visit | Attending: Family Medicine | Admitting: Family Medicine

## 2024-01-07 DIAGNOSIS — D734 Cyst of spleen: Secondary | ICD-10-CM

## 2024-01-18 ENCOUNTER — Ambulatory Visit (INDEPENDENT_AMBULATORY_CARE_PROVIDER_SITE_OTHER): Payer: Self-pay | Admitting: Internal Medicine

## 2024-01-18 ENCOUNTER — Encounter: Payer: Self-pay | Admitting: Internal Medicine

## 2024-01-18 VITALS — BP 110/80 | HR 82 | Temp 98.2°F | Ht 64.5 in | Wt 188.9 lb

## 2024-01-18 DIAGNOSIS — E785 Hyperlipidemia, unspecified: Secondary | ICD-10-CM | POA: Diagnosis not present

## 2024-01-18 DIAGNOSIS — R7302 Impaired glucose tolerance (oral): Secondary | ICD-10-CM | POA: Diagnosis not present

## 2024-01-18 DIAGNOSIS — I10 Essential (primary) hypertension: Secondary | ICD-10-CM

## 2024-01-18 DIAGNOSIS — Z23 Encounter for immunization: Secondary | ICD-10-CM | POA: Diagnosis not present

## 2024-01-18 DIAGNOSIS — E559 Vitamin D deficiency, unspecified: Secondary | ICD-10-CM

## 2024-01-18 DIAGNOSIS — E538 Deficiency of other specified B group vitamins: Secondary | ICD-10-CM

## 2024-01-18 DIAGNOSIS — Z0000 Encounter for general adult medical examination without abnormal findings: Secondary | ICD-10-CM

## 2024-01-18 NOTE — Progress Notes (Signed)
 Established Patient Office Visit     CC/Reason for Visit: Annual preventive exam  HPI: Rhonda [name] is a 53 y.o. female who is coming in today for the above mentioned reasons. Past Medical History is significant for: Hypertension, hyperlipidemia, impaired glucose tolerance, hypothyroidism, vitamin D  deficiency, chronic kidney disease stage II.  She is feeling well.  She has routine eye and dental care.  She is due for pneumonia, second shingles and Tdap vaccinations.  Cancer screening is up-to-date.   Past Medical/Surgical History: Past Medical History:  Diagnosis Date   Allergy    Anxiety    C1q nephropathy    Cancer (HCC)    history of thyroid  cancer   Chronic constipation    CKD (chronic kidney disease), stage II nephrologist-  dr Harland Libman [facility])   secondary to C1q nephropathy/  hypertension   Family history of adverse reaction to anesthesia    father hard to put down for anesthesia   GERD (gastroesophageal reflux disease)    H/O radioactive iodine thyroid  ablation    1992  right throid   History of exercise intolerance    01-052011  ETT--  normal w/ no ischemia per cardiologist (dr Mitzie Anda) epic note   History of pulmonary embolus (PE)    07/ 2008-- treated w/ coumadin   History of syncope    recurrent --  workup done per epic -- dx orthostatic hypotension   Hyperlipidemia    Hypertension    IBS (irritable bowel syndrome)    Neuromuscular disorder (HCC)    carpal tunnel   Pelvic pain in female    Post-surgical hypothyroidism    1991 left thyroidectomy   Pre-diabetes    Proteinuria    Seasonal and perennial allergic rhinitis    Wears contact lenses    Wears glasses     Past Surgical History:  Procedure Laterality Date   35 HOUR PH STUDY N/A 11/16/2016   Procedure: 24 HOUR PH STUDY;  Surgeon: Albertina Hugger, MD;  Location: [facility];  Service: Gastroenterology;  Laterality: N/A;   ABDOMINAL HYSTERECTOMY  1998    COLONOSCOPY     COLONOSCOPY WITH PROPOFOL  N/A 08/05/2021   Procedure: COLONOSCOPY WITH PROPOFOL ;  Surgeon: Daina Drum, MD;  Location: [facility];  Service: Gastroenterology;  Laterality: N/A;   CYSTOSCOPY N/A 04/28/2016   Procedure: CYSTOSCOPY;  Surgeon: Merryl Abraham, MD;  Location: [facility];  Service: Gynecology;  Laterality: N/A;   ESOPHAGEAL MANOMETRY N/A 11/16/2016   Procedure: ESOPHAGEAL MANOMETRY (EM);  Surgeon: Albertina Hugger, MD;  [facility];  Service: Gastroenterology;  Laterality: N/A;   ESOPHAGOGASTRODUODENOSCOPY     LAPAROSCOPIC CHOLECYSTECTOMY  1997   LAPAROSCOPY N/A 03/26/2016   Procedure: LAPAROSCOPY DIAGNOSTIC, LYSIS OF ADHESIONS;  Surgeon: Merryl Abraham, MD;  Location: [facility] [place];  Service: Gynecology;  Laterality: N/A;   LAPAROTOMY N/A 04/28/2016   Procedure: EXPLORATORY LAPAROTOMY;  Surgeon: Merryl Abraham, MD;  Location: [facility];  Service: Gynecology;  Laterality: N/A;   RENAL BIOPSY     SALPINGOOPHORECTOMY Bilateral 04/28/2016   Procedure: Bilateral OOPHORECTOMY;  Surgeon: Merryl Abraham, MD;  Location: [facility];  Service: Gynecology;  Laterality: Bilateral;   THYROIDECTOMY Left 1991   TRANSTHORACIC ECHOCARDIOGRAM  04/27/2012   normal LV, ef 60-65%/  trivial MR   TUBAL LIGATION  1992    Social History:  reports that she has never smoked. She has never used smokeless tobacco. She reports that she does not drink alcohol  and does not use drugs.  Allergies: Allergies  Allergen Reactions   Aspirin  Hives   Flagyl  [Metronidazole ] Nausea And Vomiting    With oral flagyl .   Has tolerated vaginal flagyl .     Food Other (See Comments)    Lima beans--  Positive allergy test    Tramadol  Other (See Comments)    Headache    Dilaudid  [Hydromorphone ] Itching    Family History:  Family History  Problem Relation Age of Onset   Diabetes Mother        Pre-diabetic   Hypertension Mother    Hypertension Father    Coronary artery disease Father     Diabetes Father    Stroke Father    Prostate cancer Father    Heart disease Father    Breast cancer Maternal Aunt    Thyroid  disease Maternal Aunt    Colon cancer Neg Hx    Colon polyps Neg Hx    Kidney disease Neg Hx    Esophageal cancer Neg Hx    Gallbladder disease Neg Hx    Stomach cancer Neg Hx    Rectal cancer Neg Hx      Current Outpatient Medications:    albuterol  (VENTOLIN  HFA) 108 (90 Base) MCG/ACT inhaler, Inhale 2 puffs into the lungs every 6 (six) hours as needed., Disp: 6.7 g, Rfl: 1   ALPRAZolam  (XANAX ) 0.25 MG tablet, Take 1 tablet (0.25 mg total) by mouth 2 (two) times daily as needed for anxiety., Disp: 60 tablet, Rfl: 0   apixaban  (ELIQUIS ) 5 MG TABS tablet, Take 1 tablet (5 mg total) by mouth 2 (two) times daily., Disp: 180 tablet, Rfl: 1   atorvastatin  (LIPITOR) 40 MG tablet, TAKE 1 TABLET(40 MG) BY MOUTH DAILY, Disp: 90 tablet, Rfl: 1   benazepril -hydrochlorthiazide (LOTENSIN  HCT) 20-25 MG tablet, Take 1 tablet by mouth daily., Disp: 90 tablet, Rfl: 1   cetirizine (ZYRTEC) 10 MG tablet, Take 10 mg by mouth daily., Disp: , Rfl:    dicyclomine  (BENTYL ) 10 MG capsule, Take 1 capsule (10 mg total) by mouth 3 (three) times daily before meals., Disp: 90 capsule, Rfl: 2   docusate sodium  (COLACE) 100 MG capsule, Take 100 mg by mouth daily., Disp: , Rfl:    esomeprazole  (NEXIUM ) 40 MG capsule, TAKE ONE CAPSULE BY MOUTH EVERY DAY AT 12 NOON., Disp: 90 capsule, Rfl: 1   glucose blood (ONETOUCH ULTRA) test strip, CHECK GLUCOSE ONCE DAILY, Disp: 100 strip, Rfl: 3   Lancet Devices (ONETOUCH DELICA PLUS LANCING) MISC, 1 each by Does not apply route daily., Disp: 100 each, Rfl: 12   Lancets (ONETOUCH DELICA PLUS LANCET33G) MISC, USE AS DIRECTED ONCE DAILY, Disp: 100 each, Rfl: 3   levothyroxine  (SYNTHROID ) 100 MCG tablet, TAKE 1 TABLET(100 MCG) BY MOUTH DAILY BEFORE BREAKFAST, Disp: 90 tablet, Rfl: 3   meclizine  (ANTIVERT ) 50 MG tablet, Take 1 tablet (50 mg total) by mouth 3  (three) times daily as needed., Disp: 30 tablet, Rfl: 0   ondansetron  (ZOFRAN -ODT) 4 MG disintegrating tablet, Take 1 tablet (4 mg total) by mouth every 8 (eight) hours as needed for nausea or vomiting., Disp: 20 tablet, Rfl: 0   Peppermint Oil (IBGARD PO), Take 1 tablet by mouth daily., Disp: , Rfl:    meloxicam  (MOBIC ) 7.5 MG tablet, Take 1 tablet (7.5 mg total) by mouth daily. (Patient not taking: Reported on 01/18/2024), Disp: 10 tablet, Rfl: 0  Review of Systems:  Negative unless indicated in HPI.   Physical Exam: Vitals:  01/18/24 1452  BP: 110/80  Pulse: 82  Temp: 98.2 F (36.8 C)  TempSrc: Oral  SpO2: 98%  Weight: 188 lb 14.4 oz (85.7 kg)  Height: 5' 4.5" (1.638 m)    Body mass index is 31.92 kg/m.   Physical Exam Vitals reviewed.  Constitutional:      General: She is not in acute distress.    Appearance: Normal appearance. She is not ill-appearing, toxic-appearing or diaphoretic.  HENT:     Head: Normocephalic.     Right Ear: Tympanic membrane, ear canal and external ear normal. There is no impacted cerumen.     Left Ear: Tympanic membrane, ear canal and external ear normal. There is no impacted cerumen.     Nose: Nose normal.     Mouth/Throat:     Mouth: Mucous membranes are moist.     Pharynx: Oropharynx is clear. No oropharyngeal exudate or posterior oropharyngeal erythema.  Eyes:     General: No scleral icterus.       Right eye: No discharge.        Left eye: No discharge.     Conjunctiva/sclera: Conjunctivae normal.     Pupils: Pupils are equal, round, and reactive to light.  Neck:     Vascular: No carotid bruit.  Cardiovascular:     Rate and Rhythm: Normal rate and regular rhythm.     Pulses: Normal pulses.     Heart sounds: Normal heart sounds.  Pulmonary:     Effort: Pulmonary effort is normal. No respiratory distress.     Breath sounds: Normal breath sounds.  Abdominal:     General: Abdomen is flat. Bowel sounds are normal.     Palpations:  Abdomen is soft.  Musculoskeletal:        General: Normal range of motion.     Cervical back: Normal range of motion.  Skin:    General: Skin is warm and dry.  Neurological:     General: No focal deficit present.     Mental Status: She is alert and oriented to person, place, and time. Mental status is at baseline.  Psychiatric:        Mood and Affect: Mood normal.        Behavior: Behavior normal.        Thought Content: Thought content normal.        Judgment: Judgment normal.        Impression and Plan:  Encounter for preventive health examination  Vitamin D  deficiency -     VITAMIN D  25 Hydroxy (Vit-D Deficiency, Fractures); Future  B12 deficiency -     Vitamin B12; Future  IGT (impaired glucose tolerance) -     Hemoglobin A1c; Future  Essential hypertension -     CBC with Differential/Platelet; Future -     Comprehensive metabolic panel with GFR; Future -     TSH; Future  Dyslipidemia -     Lipid panel; Future  Immunization due   -Recommend routine eye and dental care. -Healthy lifestyle discussed in detail. -Labs to be updated today. -Prostate cancer screening: Not applicable Health Maintenance  Topic Date Due   Zoster (Shingles) Vaccine (2 of 2) 07/14/2019   COVID-19 Vaccine (4 - 2024-25 season) 04/18/2023   Pneumococcal Vaccination (1 of 2 - PCV) 12/20/2024*   Flu Shot  03/17/2024   DTaP/Tdap/Td vaccine (2 - Td or Tdap) 06/07/2024   Mammogram  08/24/2025   Pap with HPV screening  07/31/2026   Colon Cancer Screening  08/06/2031   Hepatitis C Screening  Completed   HIV Screening  Completed   HPV Vaccine  Aged Out   Meningitis B Vaccine  Aged Out  *Topic was postponed. The date shown is not the original due date.    - Tdap in office today.  She prefers to obtain shingles and pneumonia at a different date.    Marguerita Shih, MD [facility] at [facility]

## 2024-01-18 NOTE — Addendum Note (Signed)
 Addended by: Nicolina Barrios B on: 01/18/2024 03:35 PM   Modules accepted: Orders

## 2024-01-19 ENCOUNTER — Ambulatory Visit: Payer: Self-pay | Admitting: Internal Medicine

## 2024-01-19 DIAGNOSIS — E559 Vitamin D deficiency, unspecified: Secondary | ICD-10-CM

## 2024-01-19 LAB — VITAMIN D 25 HYDROXY (VIT D DEFICIENCY, FRACTURES): VITD: 21.68 ng/mL — ABNORMAL LOW (ref 30.00–100.00)

## 2024-01-19 LAB — COMPREHENSIVE METABOLIC PANEL WITH GFR
ALT: 20 U/L (ref 0–35)
AST: 19 U/L (ref 0–37)
Albumin: 4 g/dL (ref 3.5–5.2)
Alkaline Phosphatase: 80 U/L (ref 39–117)
BUN: 27 mg/dL — ABNORMAL HIGH (ref 6–23)
CO2: 28 meq/L (ref 19–32)
Calcium: 10.2 mg/dL (ref 8.4–10.5)
Chloride: 104 meq/L (ref 96–112)
Creatinine, Ser: 1.27 mg/dL — ABNORMAL HIGH (ref 0.40–1.20)
GFR: 47.79 mL/min — ABNORMAL LOW (ref >60.00)
Glucose, Bld: 86 mg/dL (ref 70–99)
Potassium: 3.9 meq/L (ref 3.5–5.1)
Sodium: 140 meq/L (ref 135–145)
Total Bilirubin: 0.4 mg/dL (ref 0.2–1.2)
Total Protein: 7.8 g/dL (ref 6.0–8.3)

## 2024-01-19 LAB — CBC WITH DIFFERENTIAL/PLATELET
Basophils Absolute: 0.1 10*3/uL (ref 0.0–0.1)
Basophils Relative: 1.1 % (ref 0.0–3.0)
Eosinophils Absolute: 0.2 10*3/uL (ref 0.0–0.7)
Eosinophils Relative: 4 % (ref 0.0–5.0)
HCT: 35.4 % — ABNORMAL LOW (ref 36.0–46.0)
Hemoglobin: 11.7 g/dL — ABNORMAL LOW (ref 12.0–15.0)
Lymphocytes Relative: 39.9 % (ref 12.0–46.0)
Lymphs Abs: 2.4 10*3/uL (ref 0.7–4.0)
MCHC: 33 g/dL (ref 30.0–36.0)
MCV: 85.9 fl (ref 78.0–100.0)
Monocytes Absolute: 0.6 10*3/uL (ref 0.1–1.0)
Monocytes Relative: 10.4 % (ref 3.0–12.0)
Neutro Abs: 2.7 10*3/uL (ref 1.4–7.7)
Neutrophils Relative %: 44.6 % (ref 43.0–77.0)
Platelets: 262 10*3/uL (ref 150.0–400.0)
RBC: 4.12 Mil/uL (ref 3.87–5.11)
RDW: 14.6 % (ref 11.5–15.5)
WBC: 6.1 10*3/uL (ref 4.0–10.5)

## 2024-01-19 LAB — LIPID PANEL
Cholesterol: 149 mg/dL (ref 0–200)
HDL: 56 mg/dL (ref >39.00)
LDL Cholesterol: 82 mg/dL (ref 0–99)
NonHDL: 92.79
Total CHOL/HDL Ratio: 3
Triglycerides: 55 mg/dL (ref 0.0–149.0)
VLDL: 11 mg/dL (ref 0.0–40.0)

## 2024-01-19 LAB — VITAMIN B12: Vitamin B-12: 291 pg/mL (ref 211–911)

## 2024-01-19 LAB — HEMOGLOBIN A1C: Hgb A1c MFr Bld: 6 % (ref 4.6–6.5)

## 2024-01-19 LAB — TSH: TSH: 5.18 u[IU]/mL (ref 0.35–5.50)

## 2024-01-19 MED ORDER — VITAMIN D (ERGOCALCIFEROL) 1.25 MG (50000 UNIT) PO CAPS: 50000.0000 [IU] | ORAL_CAPSULE | ORAL | 0 refills | Status: DC

## 2024-01-24 ENCOUNTER — Telehealth: Payer: Self-pay | Admitting: Gastroenterology

## 2024-01-24 NOTE — Telephone Encounter (Signed)
 Negative diatherix.

## 2024-01-31 ENCOUNTER — Encounter: Payer: Self-pay | Admitting: Gastroenterology

## 2024-02-21 ENCOUNTER — Other Ambulatory Visit: Payer: Self-pay | Admitting: Internal Medicine

## 2024-02-22 ENCOUNTER — Other Ambulatory Visit: Payer: Self-pay | Admitting: Internal Medicine

## 2024-02-28 ENCOUNTER — Telehealth: Payer: Self-pay | Admitting: Endocrinology

## 2024-02-28 DIAGNOSIS — E890 Postprocedural hypothyroidism: Secondary | ICD-10-CM

## 2024-02-28 MED ORDER — LEVOTHYROXINE SODIUM 100 MCG PO TABS: ORAL_TABLET | ORAL | 3 refills | Status: DC

## 2024-02-28 NOTE — Telephone Encounter (Signed)
 Refill has been sent. Please get pot scheduled for a f/u

## 2024-02-28 NOTE — Telephone Encounter (Signed)
 MEDICATION: Levothyroxine   PHARMACY:  [facility] on [address]  HAS THE PATIENT CONTACTED THEIR PHARMACY?  yes  IS THIS A 90 DAY SUPPLY : yes  IS PATIENT OUT OF MEDICATION: no  DO WE HAVE YOUR PERMISSION TO LEAVE A DETAILED MESSAGE?: yes , [phone]

## 2024-03-16 ENCOUNTER — Telehealth: Payer: Self-pay | Admitting: *Deleted

## 2024-03-16 NOTE — Telephone Encounter (Signed)
 Copied from CRM [id number]. Topic: General - Other >> Mar 16, 2024  4:10 PM Burnard DEL wrote: Reason for CRM: Patient would like to have a copy if her tb skin test  blood work printed off for her to come an d pick up.Please contact patient when ready to be picked up.

## 2024-03-16 NOTE — Telephone Encounter (Signed)
 Left message on machine for patient that a copy of Quantiferon Gold result is ready to be picked up.

## 2024-03-21 ENCOUNTER — Other Ambulatory Visit: Payer: Self-pay | Admitting: Internal Medicine

## 2024-04-06 ENCOUNTER — Encounter (HOSPITAL_COMMUNITY): Payer: Self-pay | Admitting: Emergency Medicine

## 2024-04-06 ENCOUNTER — Other Ambulatory Visit (HOSPITAL_COMMUNITY)
Admission: RE | Admit: 2024-04-06 | Discharge: 2024-04-06 | Disposition: A | Discharge status: Home / Self Care | Payer: PRIVATE HEALTH INSURANCE | Source: Ambulatory Visit | Attending: Internal Medicine | Admitting: Internal Medicine

## 2024-04-06 ENCOUNTER — Emergency Department (HOSPITAL_COMMUNITY)
Admission: EM | Admit: 2024-04-06 | Discharge: 2024-04-06 | Discharge status: Against Medical Advice | Payer: PRIVATE HEALTH INSURANCE | Attending: Emergency Medicine | Admitting: Emergency Medicine

## 2024-04-06 ENCOUNTER — Ambulatory Visit (INDEPENDENT_AMBULATORY_CARE_PROVIDER_SITE_OTHER): Payer: PRIVATE HEALTH INSURANCE | Admitting: Internal Medicine

## 2024-04-06 ENCOUNTER — Other Ambulatory Visit: Payer: Self-pay

## 2024-04-06 ENCOUNTER — Encounter: Payer: Self-pay | Admitting: Internal Medicine

## 2024-04-06 ENCOUNTER — Emergency Department (HOSPITAL_COMMUNITY): Payer: PRIVATE HEALTH INSURANCE

## 2024-04-06 VITALS — BP 110/80 | HR 75 | Temp 97.8°F

## 2024-04-06 DIAGNOSIS — E559 Vitamin D deficiency, unspecified: Secondary | ICD-10-CM | POA: Diagnosis not present

## 2024-04-06 DIAGNOSIS — M25512 Pain in left shoulder: Secondary | ICD-10-CM | POA: Diagnosis present

## 2024-04-06 DIAGNOSIS — N898 Other specified noninflammatory disorders of vagina: Secondary | ICD-10-CM

## 2024-04-06 DIAGNOSIS — Z5321 Procedure and treatment not carried out due to patient leaving prior to being seen by health care provider: Secondary | ICD-10-CM | POA: Insufficient documentation

## 2024-04-06 DIAGNOSIS — H8113 Benign paroxysmal vertigo, bilateral: Secondary | ICD-10-CM

## 2024-04-06 LAB — VITAMIN D 25 HYDROXY (VIT D DEFICIENCY, FRACTURES): VITD: 47.63 ng/mL (ref 30.00–100.00)

## 2024-04-06 MED ORDER — MECLIZINE HCL 50 MG PO TABS: 50.0000 mg | ORAL_TABLET | Freq: Three times a day (TID) | ORAL | 0 refills | Status: AC | PRN

## 2024-04-06 NOTE — ED Notes (Signed)
 Left AMA left after dropping stickers off with registration

## 2024-04-06 NOTE — Progress Notes (Signed)
 Established Patient Office Visit     CC/Reason for Visit: Vaginal discharge and burning  HPI: Rhonda [name] is a 53 y.o. female who is coming in today for the above mentioned reasons.  Has been present for about 6 days.  She has noticed a slight yellow discharge with some burning.  No urinary symptoms.   Past Medical/Surgical History: Past Medical History:  Diagnosis Date   Allergy    Anxiety    C1q nephropathy    Cancer (HCC)    history of thyroid  cancer   Chronic constipation    CKD (chronic kidney disease), stage II nephrologist-  dr dominique pipe [facility])   secondary to C1q nephropathy/  hypertension   Family history of adverse reaction to anesthesia    father hard to put down for anesthesia   GERD (gastroesophageal reflux disease)    H/O radioactive iodine thyroid  ablation    1992  right throid   History of exercise intolerance    01-052011  ETT--  normal w/ no ischemia per cardiologist (dr rolan) epic note   History of pulmonary embolus (PE)    07/ 2008-- treated w/ coumadin   History of syncope    recurrent --  workup done per epic -- dx orthostatic hypotension   Hyperlipidemia    Hypertension    IBS (irritable bowel syndrome)    Neuromuscular disorder (HCC)    carpal tunnel   Pelvic pain in female    Post-surgical hypothyroidism    1991 left thyroidectomy   Pre-diabetes    Proteinuria    Seasonal and perennial allergic rhinitis    Wears contact lenses    Wears glasses     Past Surgical History:  Procedure Laterality Date   12 HOUR PH STUDY N/A 11/16/2016   Procedure: 24 HOUR PH STUDY;  Surgeon: Victory LITTIE Legrand DOUGLAS, MD;  [facility]: [facility];  Service: Gastroenterology;  Laterality: N/A;   ABDOMINAL HYSTERECTOMY  1998   COLONOSCOPY     COLONOSCOPY WITH PROPOFOL  N/A 08/05/2021   Procedure: COLONOSCOPY WITH PROPOFOL ;  Surgeon: Federico Rosario BROCKS, MD;  Location: [facility];  Service: Gastroenterology;  Laterality: N/A;    CYSTOSCOPY N/A 04/28/2016   Procedure: CYSTOSCOPY;  Surgeon: Norleen Skill, MD;  Location: [facility];  Service: Gynecology;  Laterality: N/A;   ESOPHAGEAL MANOMETRY N/A 11/16/2016   Procedure: ESOPHAGEAL MANOMETRY (EM);  Surgeon: Victory LITTIE Legrand DOUGLAS, MD;  Location: [facility];  Service: Gastroenterology;  Laterality: N/A;   ESOPHAGOGASTRODUODENOSCOPY     LAPAROSCOPIC CHOLECYSTECTOMY  1997   LAPAROSCOPY N/A 03/26/2016   Procedure: LAPAROSCOPY DIAGNOSTIC, LYSIS OF ADHESIONS;  Surgeon: Norleen Skill, MD;  Location: [facility];  Service: Gynecology;  Laterality: N/A;   LAPAROTOMY N/A 04/28/2016   Procedure: EXPLORATORY LAPAROTOMY;  Surgeon: Norleen Skill, MD;  Location: [facility];  Service: Gynecology;  Laterality: N/A;   RENAL BIOPSY     SALPINGOOPHORECTOMY Bilateral 04/28/2016   Procedure: Bilateral OOPHORECTOMY;  Surgeon: Norleen Skill, MD;  Location: [facility];  Service: Gynecology;  Laterality: Bilateral;   THYROIDECTOMY Left 1991   TRANSTHORACIC ECHOCARDIOGRAM  04/27/2012   normal LV, ef 60-65%/  trivial MR   TUBAL LIGATION  1992    Social History:  reports that she has never smoked. She has never used smokeless tobacco. She reports that she does not drink alcohol and does not use drugs.  Allergies: Allergies  Allergen Reactions   Aspirin  Hives   Flagyl  [Metronidazole ] Nausea And Vomiting  With oral flagyl .   Has tolerated vaginal flagyl .     Food Other (See Comments)    Lima beans--  Positive allergy test    Tramadol  Other (See Comments)    Headache    Dilaudid  [Hydromorphone ] Itching    Family History:  Family History  Problem Relation Age of Onset   Diabetes Mother        Pre-diabetic   Hypertension Mother    Hypertension Father    Coronary artery disease Father    Diabetes Father    Stroke Father    Prostate cancer Father    Heart disease Father    Breast cancer Maternal Aunt    Thyroid  disease Maternal Aunt    Colon cancer Neg Hx    Colon polyps Neg Hx     Kidney disease Neg Hx    Esophageal cancer Neg Hx    Gallbladder disease Neg Hx    Stomach cancer Neg Hx    Rectal cancer Neg Hx      Current Outpatient Medications:    albuterol  (VENTOLIN  HFA) 108 (90 Base) MCG/ACT inhaler, Inhale 2 puffs into the lungs every 6 (six) hours as needed., Disp: 6.7 g, Rfl: 1   ALPRAZolam  (XANAX ) 0.25 MG tablet, Take 1 tablet (0.25 mg total) by mouth 2 (two) times daily as needed for anxiety., Disp: 60 tablet, Rfl: 0   apixaban  (ELIQUIS ) 5 MG TABS tablet, Take 1 tablet (5 mg total) by mouth 2 (two) times daily., Disp: 180 tablet, Rfl: 1   atorvastatin  (LIPITOR) 40 MG tablet, TAKE 1 TABLET(40 MG) BY MOUTH DAILY, Disp: 90 tablet, Rfl: 1   benazepril -hydrochlorthiazide (LOTENSIN  HCT) 20-25 MG tablet, TAKE 1 TABLET BY MOUTH DAILY, Disp: 90 tablet, Rfl: 1   cetirizine (ZYRTEC) 10 MG tablet, Take 10 mg by mouth daily., Disp: , Rfl:    dicyclomine  (BENTYL ) 10 MG capsule, Take 1 capsule (10 mg total) by mouth 3 (three) times daily before meals., Disp: 90 capsule, Rfl: 2   docusate sodium  (COLACE) 100 MG capsule, Take 100 mg by mouth daily., Disp: , Rfl:    esomeprazole  (NEXIUM ) 40 MG capsule, TAKE ONE CAPSULE BY MOUTH EVERY DAY AT 12 NOON., Disp: 90 capsule, Rfl: 1   Lancet Devices (ONETOUCH DELICA PLUS LANCING) MISC, 1 each by Does not apply route daily., Disp: 100 each, Rfl: 12   Lancets (ONETOUCH DELICA PLUS LANCET33G) MISC, USE AS DIRECTED ONCE DAILY, Disp: 100 each, Rfl: 3   levothyroxine  (SYNTHROID ) 100 MCG tablet, TAKE 1 TABLET(100 MCG) BY MOUTH DAILY BEFORE BREAKFAST, Disp: 90 tablet, Rfl: 3   meloxicam  (MOBIC ) 7.5 MG tablet, Take 1 tablet (7.5 mg total) by mouth daily., Disp: 10 tablet, Rfl: 0   ondansetron  (ZOFRAN -ODT) 4 MG disintegrating tablet, Take 1 tablet (4 mg total) by mouth every 8 (eight) hours as needed for nausea or vomiting., Disp: 20 tablet, Rfl: 0   ONETOUCH ULTRA TEST test strip, CHECK BLOOD GLUCOSE ONCE DAILY, Disp: 100 strip, Rfl: 3    Peppermint Oil (IBGARD PO), Take 1 tablet by mouth daily., Disp: , Rfl:    Vitamin D , Ergocalciferol , (DRISDOL ) 1.25 MG (50000 UNIT) CAPS capsule, Take 1 capsule (50,000 Units total) by mouth every 7 (seven) days for 12 doses., Disp: 12 capsule, Rfl: 0   meclizine  (ANTIVERT ) 50 MG tablet, Take 1 tablet (50 mg total) by mouth 3 (three) times daily as needed., Disp: 30 tablet, Rfl: 0  Review of Systems:  Negative unless indicated in HPI.   Physical Exam:  Vitals:   04/06/24 0736  BP: 110/80  Pulse: 75  Temp: 97.8 F (36.6 C)  TempSrc: Oral  SpO2: 96%    There is no height or weight on file to calculate BMI.   Physical Exam Exam conducted with a chaperone present.  Genitourinary:    General: Normal vulva.     Vagina: Vaginal discharge present.     Cervix: Normal.      Impression and Plan:  Vaginal discharge -     Hepatitis panel, acute; Future -     RPR; Future -     HIV Antibody (routine testing w rflx); Future -     Cervicovaginal ancillary only  Benign paroxysmal positional vertigo due to bilateral vestibular disorder -     Meclizine  HCl; Take 1 tablet (50 mg total) by mouth 3 (three) times daily as needed.  Dispense: 30 tablet; Refill: 0   - Will screen for STD today with cervicovaginal testing and blood testing.  Treatment pending results.  Time spent:30 minutes reviewing chart, interviewing and examining patient and formulating plan of care.     [name], MD [facility] at [facility]

## 2024-04-06 NOTE — ED Triage Notes (Signed)
 Pt states she was pushing pt in Rolator up ramp and ramp was unlevel. Pt now having pain to left shoulder and all down left leg.

## 2024-04-07 ENCOUNTER — Ambulatory Visit (HOSPITAL_BASED_OUTPATIENT_CLINIC_OR_DEPARTMENT_OTHER): Payer: Self-pay | Admitting: Family

## 2024-04-07 LAB — RPR: RPR Ser Ql: NONREACTIVE

## 2024-04-07 LAB — HEPATITIS PANEL, ACUTE
Hep A IgM: NONREACTIVE
Hep B C IgM: NONREACTIVE
Hepatitis B Surface Ag: NONREACTIVE
Hepatitis C Ab: NONREACTIVE

## 2024-04-07 LAB — CERVICOVAGINAL ANCILLARY ONLY
Bacterial Vaginitis (gardnerella): POSITIVE — AB
Candida Glabrata: NEGATIVE
Candida Vaginitis: NEGATIVE
Chlamydia: NEGATIVE
Comment: NEGATIVE
Comment: NEGATIVE
Comment: NEGATIVE
Comment: NEGATIVE
Comment: NEGATIVE
Comment: NORMAL
Neisseria Gonorrhea: NEGATIVE
Trichomonas: NEGATIVE

## 2024-04-07 LAB — HIV ANTIBODY (ROUTINE TESTING W REFLEX): HIV 1&2 Ab, 4th Generation: NONREACTIVE

## 2024-04-10 ENCOUNTER — Telehealth: Payer: Self-pay

## 2024-04-10 NOTE — Transitions of Care (Post Inpatient/ED Visit) (Signed)
   04/10/2024  Name: Rhonda [name] MRN: [id number] DOB: 1970/09/02  Today's TOC FU Call Status: Today's TOC FU Call Status:: Successful TOC FU Call Completed TOC FU Call Complete Date: 04/10/24 Patient's Name and Date of Birth confirmed.  Transition Care Management Follow-up Telephone Call Date of Discharge: 04/06/24 Discharge Facility: [facility]) Type of Discharge: Emergency Department Reason for ED Visit: Other: (injury from work.) How have you been since you were released from the hospital?: Same Any questions or concerns?: No  Items Reviewed:    Medications Reviewed Today: Medications Reviewed Today   Medications were not reviewed in this encounter     Home Care and Equipment/Supplies:    Functional Questionnaire:    Follow up appointments reviewed: PCP Follow-up appointment confirmed?: NA (pt reports she didn't stay at ER visit as injury from work and insurance wouldn't cover. pt is currently seeing worker's comp provider through her workplace.)    SIGNATURE: Xiana Carns, CMA

## 2024-04-11 ENCOUNTER — Other Ambulatory Visit: Payer: Self-pay | Admitting: Internal Medicine

## 2024-04-11 ENCOUNTER — Encounter: Payer: Self-pay | Admitting: Internal Medicine

## 2024-04-11 ENCOUNTER — Ambulatory Visit: Payer: Self-pay

## 2024-04-11 DIAGNOSIS — E559 Vitamin D deficiency, unspecified: Secondary | ICD-10-CM

## 2024-04-11 MED ORDER — APIXABAN 5 MG PO TABS: 5.0000 mg | ORAL_TABLET | Freq: Two times a day (BID) | ORAL | Status: DC

## 2024-04-11 NOTE — Addendum Note (Signed)
 Addended by: KATHRYNE MILLMAN B on: 04/11/2024 03:46 PM   Modules accepted: Orders

## 2024-04-11 NOTE — Telephone Encounter (Signed)
 Spoke to the patient and she states that they have not yet received her form that she completed. She is running out of eliquis .  3 sample boxes available.  Patient will pick them up.

## 2024-04-11 NOTE — Telephone Encounter (Signed)
 Summary: Eliquis    Patient called in to speak with nurse regarding Eliquis . Please call [phone]     Message left for patient to call back to Nurse Triage.

## 2024-04-11 NOTE — Telephone Encounter (Signed)
 Summary: Eliquis    Patient called in to speak with nurse regarding Eliquis . Please call [phone]     2nd attempt to contact patient. Voicemail left for patient to call back to Nurse Triage.

## 2024-04-11 NOTE — Telephone Encounter (Signed)
 Summary: Eliquis    Patient called in to speak with nurse regarding Eliquis . Please call [phone]        Third attempt to contact patient. Message left for patient to call back to Nurse triage. Will route to office.

## 2024-04-12 ENCOUNTER — Ambulatory Visit: Payer: Self-pay | Admitting: Internal Medicine

## 2024-04-12 DIAGNOSIS — B9689 Other specified bacterial agents as the cause of diseases classified elsewhere: Secondary | ICD-10-CM

## 2024-04-12 MED ORDER — METRONIDAZOLE 500 MG PO TABS
2000.0000 mg | ORAL_TABLET | Freq: Once | ORAL | 0 refills | Status: AC
Start: 2024-04-12 — End: 2024-04-12

## 2024-04-12 MED ORDER — APIXABAN 5 MG PO TABS: 5.0000 mg | ORAL_TABLET | Freq: Two times a day (BID) | ORAL | 1 refills | Status: DC

## 2024-04-12 NOTE — Telephone Encounter (Signed)
 Call [employer] to follow up on PAP Eliquis , spoke with a representative said at this time pt is denied due to pt having commercial Ins.([employer])if pt wants to proceed with PAP,she would need to get a denied letter from [facility] stating medication is not be cover and then submitted to [employer] to continue and will need 3% out of pocket spend from pharmacy. Spoke with pt said she will ask [employer] for a denial letter and will get back to us .

## 2024-04-12 NOTE — Telephone Encounter (Signed)
Noted. Refill sent.

## 2024-04-18 ENCOUNTER — Other Ambulatory Visit: Payer: Self-pay | Admitting: Internal Medicine

## 2024-04-18 ENCOUNTER — Encounter: Payer: Self-pay | Admitting: Sports Medicine

## 2024-04-18 NOTE — Telephone Encounter (Signed)
 No I have not.  Do you want me to ask Jon?

## 2024-04-18 NOTE — Telephone Encounter (Signed)
 They do make a metronidazole  0.75% gel, use one applicatorful vaginally once daily at bedtime for 5 days for treatment of BV. Quantity 70g. Cream is usually harder to find and gel preferred by most insurances.

## 2024-04-19 ENCOUNTER — Encounter: Payer: Self-pay | Admitting: Internal Medicine

## 2024-04-19 MED ORDER — METRONIDAZOLE 0.75 % EX GEL: 1.0000 | Freq: Every day | CUTANEOUS | 0 refills | Status: DC

## 2024-04-19 NOTE — Addendum Note (Signed)
 Addended by: KATHRYNE MILLMAN B on: 04/19/2024 09:50 AM   Modules accepted: Orders

## 2024-04-23 ENCOUNTER — Other Ambulatory Visit: Payer: Self-pay | Admitting: Internal Medicine

## 2024-04-23 DIAGNOSIS — B9689 Other specified bacterial agents as the cause of diseases classified elsewhere: Secondary | ICD-10-CM

## 2024-04-23 MED ORDER — METRONIDAZOLE 0.75 % VA GEL: 1.0000 | Freq: Two times a day (BID) | VAGINAL | 0 refills | Status: AC

## 2024-04-24 ENCOUNTER — Other Ambulatory Visit (INDEPENDENT_AMBULATORY_CARE_PROVIDER_SITE_OTHER): Payer: PRIVATE HEALTH INSURANCE

## 2024-04-24 ENCOUNTER — Ambulatory Visit (INDEPENDENT_AMBULATORY_CARE_PROVIDER_SITE_OTHER): Admitting: Orthopedic Surgery

## 2024-04-24 DIAGNOSIS — M25531 Pain in right wrist: Secondary | ICD-10-CM | POA: Diagnosis not present

## 2024-04-24 DIAGNOSIS — G8929 Other chronic pain: Secondary | ICD-10-CM

## 2024-04-24 DIAGNOSIS — M1811 Unilateral primary osteoarthritis of first carpometacarpal joint, right hand: Secondary | ICD-10-CM

## 2024-04-24 DIAGNOSIS — G5601 Carpal tunnel syndrome, right upper limb: Secondary | ICD-10-CM | POA: Diagnosis not present

## 2024-04-24 NOTE — Progress Notes (Signed)
 Rhonda [name] - 53 y.o. female MRN [id number]  Date of birth: 1970/09/16  Office Visit Note: Visit Date: 04/24/2024 PCP: Theophilus Andrews, [name], MD Referred by: Theophilus Andrews, Estel*  Subjective: No chief complaint on file.  HPI: Rhonda [name] is a pleasant 53 y.o. female who returns today for ongoing right thumb basilar joint pain and associated numbness and tingling in the right hand.  Her symptoms have been present now for greater than a year, she denies any significant injury or inciting event.    She has undergone prior right thumb CMC injection with minimal relief has also trialed bracing without relief.  She is having nocturnal symptoms ongoing, numbness on the radial aspect of the hand in particular.  Pertinent ROS were reviewed with the patient and found to be negative unless otherwise specified above in HPI.   Visit Reason: Right wrist pain Duration of symptoms: greater than a year Hand dominance: right Occupation:CNA Diabetic: No-prediabetic Smoking: No Heart/Lung History:none Blood Thinners: yes-Eliquis - history of blood clots  Prior Testing/EMG: MRI right wrist Injections (Date): multiple-last one on 08/16/2023 Treatments:therapy  Prior Surgery:none  Assessment & Plan: Visit Diagnoses:  1. Arthritis of carpometacarpal (CMC) joint of right thumb   2. Chronic pain of right wrist   3. Carpal tunnel syndrome, right upper limb     Plan:  Extensive discussion was had with the patient today regarding her ongoing right thumb CMC osteoarthritis.  X-rays were reviewed in detail today which do show significant degenerative change at the right thumb [facility] articulation with associated STT arthritis.  We discussed treatment modalities ranging from conservative to surgical.  From a conservative standpoint we discussed bracing, injections, anti-inflammatory medications and activity modification.  From a surgical standpoint we discussed [facility] arthroplasty,  risks and benefits as well as the postoperative protocol.  At this juncture, given the severity of symptoms that have remained refractory to conservative care, patient would like to move forward with surgical intervention.  Given the significance of the degenerative change on x-ray which clinically correlates with examination, we can move forward with surgical scheduling of right thumb CMC arthroplasty at the next available date.  Risks and benefits of the procedure were discussed, risks including but not limited to infection, bleeding, scarring, stiffness, nerve injury, tendon injury, vascular injury, hardware complication, recurrence of symptoms and need for subsequent operation.  We also discussed the specifics of the postoperative protocol and the appropriate timeline.  Patient expressed understanding.  As far as the numbness and tingling, she has signs symptoms that are consistent with the CTS 6 criteria indicating ongoing carpal tunnel syndrome.  Given that her symptoms are refractory to conservative care in the form of activity modification, bracing and prior injection, patient is indicated for right wrist carpal tunnel release.  This can be performed in conjunction with the right thumb CMC arthroplasty.  Will move forward surgical scheduling of right thumb CMC arthroplasty and open carpal tunnel release at the next available date per patient preference.  Follow-up: No follow-ups on file.   Meds & Orders: No orders of the defined types were placed in this encounter.   Orders Placed This Encounter  Procedures   XR Wrist Complete Right     Procedures: No procedures performed      Clinical History: No specialty comments available.  She reports that she has never smoked. She has never used smokeless tobacco.  Recent Labs    07/12/23 1422 01/18/24 1525  HGBA1C 6.1 6.0    Objective:  Vital Signs: There were no vitals taken for this visit.  Physical Exam  Gen: Well-appearing, in  no acute distress; non-toxic CV: Regular Rate. Well-perfused. Warm.  Resp: Breathing unlabored on room air; no wheezing. Psych: Fluid speech in conversation; appropriate affect; normal thought process  Ortho Exam Right hand: - Moderate tenderness over the thumb CMC region with deep palpation, positive grind for pain and crepitus, no significant MP hyperextension - Able to form composite fist without restriction - Moderate tenderness noted over the scapholunate interval dorsally - Watson test does demonstrate some pain, negative for instability or clunk - Mild tenderness over the foveal region, DRUJ stable in all planes - Right wrist positive Tinel's carpal tunnel, positive Phalen's, positive Durkan's compression - 2-point discrimination in the median nerve distribution is greater than 8 mm  Imaging: X-rays of the right wrist were obtained today  Past Medical/Family/Surgical/Social History: Medications & Allergies reviewed per EMR, new medications updated. Patient Active Problem List   Diagnosis Date Noted   Chronic pain of right wrist 08/16/2023   Hypomagnesemia 10/06/2021   Hypercalcemia 10/06/2021   Colon cancer screening    Bilateral carpal tunnel syndrome 01/07/2021   Flexor carpi radialis tendinitis 01/07/2021   Vitamin D  deficiency 05/19/2019   IGT (impaired glucose tolerance) 05/19/2019   Exposure to medical diagnostic radiation 02/09/2017   S/P bilateral salpingo-oophorectomy 04/28/2016   Pelvic adhesions 03/26/2016    Class: Present on Admission   History of pulmonary embolism 08/01/2014   CKD (chronic kidney disease) stage 3, GFR 30-59 ml/min (HCC) 02/27/2014   Hypothyroidism 04/28/2012   Hemorrhage of rectum and anus 02/09/2011   Constipation 02/09/2011   Dyslipidemia 08/26/2009   Backache 02/14/2008   Allergic rhinitis 11/15/2007   GERD 06/07/2007   Anxiety state 04/27/2007   Essential hypertension 04/27/2007   Disorder of kidney and ureter 03/17/2007    EMBOLISM/THROMBOSIS, DEEP VSL PRXML LWR EXTRM 03/16/2007   Past Medical History:  Diagnosis Date   Allergy    Anxiety    C1q nephropathy    Cancer (HCC)    history of thyroid  cancer   Chronic constipation    CKD (chronic kidney disease), stage II nephrologist-  dr dominique pipe [facility])   secondary to C1q nephropathy/  hypertension   Family history of adverse reaction to anesthesia    father hard to put down for anesthesia   GERD (gastroesophageal reflux disease)    H/O radioactive iodine thyroid  ablation    1992  right throid   History of exercise intolerance    01-052011  ETT--  normal w/ no ischemia per cardiologist (dr rolan) epic note   History of pulmonary embolus (PE)    07/ 2008-- treated w/ coumadin   History of syncope    recurrent --  workup done per epic -- dx orthostatic hypotension   Hyperlipidemia    Hypertension    IBS (irritable bowel syndrome)    Neuromuscular disorder (HCC)    carpal tunnel   Pelvic pain in female    Post-surgical hypothyroidism    1991 left thyroidectomy   Pre-diabetes    Proteinuria    Seasonal and perennial allergic rhinitis    Wears contact lenses    Wears glasses    Family History  Problem Relation Age of Onset   Diabetes Mother        Pre-diabetic   Hypertension Mother    Hypertension Father    Coronary artery disease Father    Diabetes Father    Stroke  Father    Prostate cancer Father    Heart disease Father    Breast cancer Maternal Aunt    Thyroid  disease Maternal Aunt    Colon cancer Neg Hx    Colon polyps Neg Hx    Kidney disease Neg Hx    Esophageal cancer Neg Hx    Gallbladder disease Neg Hx    Stomach cancer Neg Hx    Rectal cancer Neg Hx    Past Surgical History:  Procedure Laterality Date   68 HOUR PH STUDY N/A 11/16/2016   Procedure: 24 HOUR PH STUDY;  Surgeon: Victory LITTIE Legrand DOUGLAS, MD;  Location: [facility];  Service: Gastroenterology;  Laterality: N/A;   ABDOMINAL HYSTERECTOMY   1998   COLONOSCOPY     COLONOSCOPY WITH PROPOFOL  N/A 08/05/2021   Procedure: COLONOSCOPY WITH PROPOFOL ;  Surgeon: Federico Rosario BROCKS, MD;  Location: [facility];  Service: Gastroenterology;  Laterality: N/A;   CYSTOSCOPY N/A 04/28/2016   Procedure: CYSTOSCOPY;  Surgeon: Norleen Skill, MD;  Location: [facility];  Service: Gynecology;  Laterality: N/A;   ESOPHAGEAL MANOMETRY N/A 11/16/2016   Procedure: ESOPHAGEAL MANOMETRY (EM);  Surgeon: Victory LITTIE Legrand DOUGLAS, MD;  Location: [facility];  Service: Gastroenterology;  Laterality: N/A;   ESOPHAGOGASTRODUODENOSCOPY     LAPAROSCOPIC CHOLECYSTECTOMY  1997   LAPAROSCOPY N/A 03/26/2016   Procedure: LAPAROSCOPY DIAGNOSTIC, LYSIS OF ADHESIONS;  Surgeon: Norleen Skill, MD;  Location: [facility];  Service: Gynecology;  Laterality: N/A;   LAPAROTOMY N/A 04/28/2016   Procedure: EXPLORATORY LAPAROTOMY;  Surgeon: Norleen Skill, MD;  Location: [facility];  Service: Gynecology;  Laterality: N/A;   RENAL BIOPSY     SALPINGOOPHORECTOMY Bilateral 04/28/2016   Procedure: Bilateral OOPHORECTOMY;  Surgeon: Norleen Skill, MD;  Location: [facility];  Service: Gynecology;  Laterality: Bilateral;   THYROIDECTOMY Left 1991   TRANSTHORACIC ECHOCARDIOGRAM  04/27/2012   normal LV, ef 60-65%/  trivial MR   TUBAL LIGATION  1992   Social History   Occupational History   Occupation: NURSING ASSISTANT    Employer: [employer]  Tobacco Use   Smoking status: Never   Smokeless tobacco: Never  Vaping Use   Vaping status: Never Used  Substance and Sexual Activity   Alcohol use: No   Drug use: No   Sexual activity: Yes    Birth control/protection: Surgical    Rasul Decola Estela) Arlinda, M.D. [facility]

## 2024-04-27 ENCOUNTER — Telehealth: Payer: Self-pay | Admitting: *Deleted

## 2024-04-27 NOTE — Telephone Encounter (Signed)
   Pre-operative Risk Assessment    Patient Name: Rhonda [name]  DOB: Jun 28, 1971 MRN: [id number]   Date of last office visit: 01/22/23 DR. BRIDGETT CHRISTOPHER Date of next office visit: 06/16/24 CAITLIN WALKER, NP   Request for Surgical Clearance    Procedure:  RIGHT THUMB CMC ARTHROPLASTY WITH INTERNAL BRACE & OPEN CARPAL TUNNEL RELEASE  Date of Surgery:  Clearance TBD                                Surgeon:  DR. GILDARDO ALDERTON Surgeon's Group or Practice Name:  [facility] Phone number:  [phone] Fax number:  [phone]   Type of Clearance Requested:   - Medical  - Pharmacy:  Hold Apixaban  (Eliquis ) x 3 DAYS PRIOR   Type of Anesthesia:  REGIONAL   Additional requests/questions:    Bonney Niels Jest   04/27/2024, 4:56 PM

## 2024-04-27 NOTE — Telephone Encounter (Signed)
   Name: Rhonda [name]  DOB: 1971/03/18  MRN: [id number]  Primary Cardiologist: Shelda Bruckner, MD  Chart reviewed as part of pre-operative protocol coverage. Because of Rhonda [name] past medical history and time since last visit, she will require a follow-up in-office visit in order to better assess preoperative cardiovascular risk.  Pre-op covering staff: - Please schedule appointment and call patient to inform them. If patient already had an upcoming appointment within acceptable timeframe, please add pre-op clearance to the appointment notes so provider is aware. - Please contact requesting surgeon's office via preferred method (i.e, phone, fax) to inform them of need for appointment prior to surgery.   Jon Nat Hails, PA  04/27/2024, 5:16 PM

## 2024-05-01 NOTE — Telephone Encounter (Signed)
 Patient called back to set up an office appt for 05/05/24 for a pre-op clearance on RIGHT THUMB CMC ARTHROPLASTY WITH INTERNAL BRACE & OPEN CARPAL TUNNEL RELEASE.

## 2024-05-01 NOTE — Telephone Encounter (Signed)
 Called patient to set up a pre-op clearance, no answer left a vm to call back

## 2024-05-01 NOTE — Telephone Encounter (Signed)
 Pt returning call. Please advise.

## 2024-05-05 ENCOUNTER — Ambulatory Visit (HOSPITAL_BASED_OUTPATIENT_CLINIC_OR_DEPARTMENT_OTHER): Payer: PRIVATE HEALTH INSURANCE | Admitting: Family

## 2024-05-05 ENCOUNTER — Encounter (HOSPITAL_BASED_OUTPATIENT_CLINIC_OR_DEPARTMENT_OTHER): Payer: Self-pay | Admitting: Family

## 2024-05-05 VITALS — BP 110/80 | HR 56 | Ht 65.0 in | Wt 196.0 lb

## 2024-05-05 DIAGNOSIS — Z6832 Body mass index (BMI) 32.0-32.9, adult: Secondary | ICD-10-CM

## 2024-05-05 DIAGNOSIS — I479 Paroxysmal tachycardia, unspecified: Secondary | ICD-10-CM

## 2024-05-05 DIAGNOSIS — I10 Essential (primary) hypertension: Secondary | ICD-10-CM | POA: Diagnosis not present

## 2024-05-05 DIAGNOSIS — E785 Hyperlipidemia, unspecified: Secondary | ICD-10-CM

## 2024-05-05 DIAGNOSIS — I2510 Atherosclerotic heart disease of native coronary artery without angina pectoris: Secondary | ICD-10-CM

## 2024-05-05 DIAGNOSIS — R7303 Prediabetes: Secondary | ICD-10-CM

## 2024-05-05 NOTE — Telephone Encounter (Signed)
 Eliquis  is prescribed for history of PE x 2 by Miss Vanstory-Carter's primary care provider. As such, recommendations will need to come from their office. Will make patient and requesting provider aware when I fax my office note from today's visit.   Rickie Gutierres S Jenisa Monty, NP

## 2024-05-05 NOTE — Progress Notes (Signed)
 Cardiology Office Note   Date:  05/05/2024  ID:  Aishia Barkey, DOB April 30, 1971, MRN [id number] PCP: Theophilus Andrews, [name], MD  [facility] Providers Cardiologist:  Shelda Bruckner, MD     History of Present Illness Kaeley Vinje is a 53 y.o. female with history of PE 2008 and 12/2022, anxiety, HTN, HLD, hypothyroidism, GERD, thyroid  cancer s/p left thyroidectomy, CKD, coronary calcification on CT.   Prior echo 04/2012 LVEF 60 to 65%, no RWMA. Prior monitor 04/2022 predominantly normal sinus rhythm, average heart rate 78 bpm, 1 run of SVT lasting 7 beats and 11 triggered episodes which were sinus rhythm.  No significant arrhythmias.  She was last seen 01/22/2023 with improving episodes of chest pain which were felt to be related to her recent PE.  No ischemic eval was recommended.  CT 06/2023 with report of 'minimal single vessel scattered calcific plaque noted in LAD coronary artery  She presents for preop clearance for right [facility] arthroplasty with internal brace and open carpal tunnel release. Reports no shortness of breath nor dyspnea on exertion. Reports no chest pain, pressure, or tightness. No edema, orthopnea, PND. Reports no palpitations.  BP at home has been well controlled. 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  ROS: Please see the history of present illness.    All other systems reviewed and are negative.   Studies Reviewed EKG Interpretation Date/Time:  Friday May 05 2024 08:58:35 EDT Ventricular Rate:  56 PR Interval:  190 QRS Duration:  70 QT Interval:  420 QTC Calculation: 405 R Axis:   38  Text Interpretation: Sinus bradycardia  No acute ST/T wave changes Confirmed by Vannie Mora ([id number]) on 05/05/2024 9:30:04 AM    Cardiac Studies & Procedures   ______________________________________________________________________________________________        SHERRILEE  LONG TERM MONITOR (3-14 DAYS) 01/27/2022  Narrative Patch Wear  Time:  11 days and 5 hours  Patient had a min HR of 27 bpm, max HR of 158 bpm, and avg HR of 78 bpm. Predominant underlying rhythm was Sinus Rhythm. 1 run of Supraventricular Tachycardia occurred lasting 7 beats with a max rate of 150 bpm (avg 143 bpm). No VT, atrial fibrillation, high degree block, or pauses noted. Isolated atrial and ventricular ectopy was rare (<1%). There were 11 triggered events, which were sinus rhythm. No significant arrhythmias detected.       ______________________________________________________________________________________________      Risk Assessment/Calculations           Physical Exam VS:  BP 110/80   Pulse (!) 56   Ht 5' 5 (1.651 m)   Wt 196 lb (88.9 kg)   SpO2 97%   BMI 32.62 kg/m        Wt Readings from Last 3 Encounters:  05/05/24 196 lb (88.9 kg)  04/06/24 189 lb 9.5 oz (86 kg)  01/18/24 188 lb 14.4 oz (85.7 kg)    GEN: Well nourished, well developed in no acute distress NECK: No JVD; No carotid bruits CARDIAC: RRR, no murmurs, rubs, gallops RESPIRATORY:  Clear to auscultation without rales, wheezing or rhonchi  ABDOMEN: Soft, non-tender, non-distended EXTREMITIES:  No edema; No deformity   ASSESSMENT AND PLAN  Preop - According to the Revised Cardiac Risk Index (RCRI), her Perioperative Risk of Major Cardiac Event is (%): 0.9. Her Functional Capacity in METs is: 8.97 according to the [facility] Activity Status Index (DASI). Per AHA/ACC guidelines, she is deemed acceptable risk for the planned procedure without additional cardiovascular testing. Will route  to surgical team so they are aware.  Recommendations regarding holding Eliquis  will need to come from prescribing provider, Dr. Theophilus Andrews.  History of PE X2/hypercoagulable state- resolved by CT 06/2023. Maintained on Eliquis  5mg  BID. Recommendations regarding anticoagulation prior to surgery to be addressed by primary care team.   HTN- BP well controlled. Continue current  antihypertensive regimen Benazperil-hydrochlorothiazide  20-25mg  dailyl.  Coronary calcification on CT / HLD, LDL goal <70 - Stable with no anginal symptoms. No indication for ischemic evaluation.  GDMT Atorvastatin  40mg  daily. No ASA due to OAC. No beta blocker due to bradycardia. Recommend aiming for 150 minutes of moderate intensity activity per week and following a heart healthy diet.   01/2024 LDL 82. Discussed lifestyle changes and handouts provided. If LDL not at goal by labs 01/2025 consider increased dose Atorvastatin .   Palpitations- isolated episode recent. No AV nodal blocking agent due to baseline bradycardia. If symptoms increase in frequency, could repeat ZIO monitor.   Prediabetes / Obesity - motivated to lose weight and lower cholesterol/A1c through lifestyle changes.  Will refer to nutrition and diabetic education.       Dispo: follow up 1 year  Signed, Reche GORMAN Finder, NP

## 2024-05-05 NOTE — Patient Instructions (Addendum)
 Medication Instructions:  Continue your current medications.   Recommendations regarding Eliquis  before surgery will come from Dr. Theophilus Sola office.   *If you need a refill on your cardiac medications before your next appointment, please call your pharmacy*  Testing/Procedures: Your EKG today showed sinus bradycardia which is a normal but slightly slow heart rhythm. This is a good result!  Follow-Up: At [facility], you and your health needs are our priority.  As part of our continuing mission to provide you with exceptional heart care, our providers are all part of one team.  This team includes your primary Cardiologist (physician) and Advanced Practice Providers or APPs (Physician Assistants and Nurse Practitioners) who all work together to provide you with the care you need, when you need it.  Your next appointment:   1 year(s)  Provider:   Shelda Bruckner, MD, Rosaline Bane, NP, or Reche Finder, NP    We recommend signing up for the patient portal called MyChart.  Sign up information is provided on this After Visit Summary.  MyChart is used to connect with patients for Virtual Visits (Telemedicine).  Patients are able to view lab/test results, encounter notes, upcoming appointments, etc.  Non-urgent messages can be sent to your provider as well.   To learn more about what you can do with MyChart, go to [email or web address].   Other Instructions  Your LDL (bad cholesterol) on your labs in June was 82. We would like it to be less than 70. We recommend working on lifestyle changes. If it is still more than 70 on your labs next June we may consider increasing your Atorvastatin  at that time.   To prevent palpitations: Make sure you are adequately hydrated.  Avoid and/or limit caffeine containing beverages like soda or tea. Exercise regularly.  Manage stress well. Some over the counter medications can cause palpitations such as Benadryl , AdvilPM,  TylenolPM. Regular Advil  or Tylenol  do not cause palpitations.

## 2024-05-08 ENCOUNTER — Ambulatory Visit: Admitting: Orthopedic Surgery

## 2024-05-09 ENCOUNTER — Encounter: Payer: Self-pay | Admitting: Endocrinology

## 2024-05-09 ENCOUNTER — Ambulatory Visit: Payer: PRIVATE HEALTH INSURANCE | Admitting: Endocrinology

## 2024-05-09 VITALS — BP 132/70 | HR 78 | Resp 20 | Ht 65.0 in | Wt 197.4 lb

## 2024-05-09 DIAGNOSIS — Z85850 Personal history of malignant neoplasm of thyroid: Secondary | ICD-10-CM | POA: Diagnosis not present

## 2024-05-09 DIAGNOSIS — E890 Postprocedural hypothyroidism: Secondary | ICD-10-CM | POA: Diagnosis not present

## 2024-05-09 NOTE — Progress Notes (Unsigned)
 Outpatient Endocrinology Note Rhonda Dale Ribeiro, MD  05/10/24  Patient's Name: Rhonda [name]    DOB: Dec 22, 1970    MRN: [id number]  REASON OF VISIT: Follow-up for hypothyroidism  PCP: Theophilus Andrews, [name], MD  HISTORY OF PRESENT ILLNESS:   Rhonda [name] is a 53 y.o. old female with past medical history as listed below is presented for a follow up for postsurgical hypothyroidism.  History of thyroid  cancer.  Patient was previously seen by Dr. Von and was last time seen in December 2023.  Pertinent Thyroid  History: Patient was diagnosed with hypothyroidism in 1991.  Hypothyroidism was secondary to left thyroidectomy for goiter. She was told that after the surgery there was a focus of cancer found in her thyroid .  She reported she subsequently had radioactive iodine treatment for right thyroid  after the RAI whole-body scan after 2 years of thyroid  surgery.  She denies having completion thyroidectomy prior to RAI radiation treatment.  No records of history of thyroid  cancer and thyroid  surgery and treatment available to review.  Patient is being managed for postsurgical hypothyroidism.  No prior records of thyroglobulin panel and ultrasound neck or neck imaging are available to review for several years.  She had thyroglobulin of 0.1 with negative thyroglobulin antibody in December 2024.  Patient has been on levothyroxine  and dose has been adjusted periodically in the past.  She had been up to levothyroxine  150 mcg daily in the past.  Lately she has been taking levothyroxine  100 mcg daily.   Interval history Patient has been taking levothyroxine  100 mcg daily.  Denies palpitation and heat tolerance.  Taking in the early morning before fasting and reports compliance.  Patient reports on Sunday she felt small nodule on the right submandibular area.  She has allergy-like symptom otherwise denies any runny nose or sore throat or fever.  TSH was 5.18 in June 2025.  REVIEW OF  SYSTEMS:  As per history of present illness.   PAST MEDICAL HISTORY: Past Medical History:  Diagnosis Date   Allergy    Anxiety    C1q nephropathy    Cancer (HCC)    history of thyroid  cancer   Chronic constipation    CKD (chronic kidney disease), stage II nephrologist-  dr dominique pipe [facility])   secondary to C1q nephropathy/  hypertension   Family history of adverse reaction to anesthesia    father hard to put down for anesthesia   GERD (gastroesophageal reflux disease)    H/O radioactive iodine thyroid  ablation    1992  right throid   History of exercise intolerance    01-052011  ETT--  normal w/ no ischemia per cardiologist (dr rolan) epic note   History of pulmonary embolus (PE)    07/ 2008-- treated w/ coumadin   History of syncope    recurrent --  workup done per epic -- dx orthostatic hypotension   Hyperlipidemia    Hypertension    IBS (irritable bowel syndrome)    Neuromuscular disorder (HCC)    carpal tunnel   Pelvic pain in female    Post-surgical hypothyroidism    1991 left thyroidectomy   Pre-diabetes    Proteinuria    Seasonal and perennial allergic rhinitis    Wears contact lenses    Wears glasses     PAST SURGICAL HISTORY: Past Surgical History:  Procedure Laterality Date   93 HOUR PH STUDY N/A 11/16/2016   Procedure: 24 HOUR PH STUDY;  Surgeon: Victory LITTIE Legrand DOUGLAS, MD;  Location:  [facility];  Service: Gastroenterology;  Laterality: N/A;   ABDOMINAL HYSTERECTOMY  1998   COLONOSCOPY     COLONOSCOPY WITH PROPOFOL  N/A 08/05/2021   Procedure: COLONOSCOPY WITH PROPOFOL ;  Surgeon: Federico Rosario BROCKS, MD;  Location: [facility];  Service: Gastroenterology;  Laterality: N/A;   CYSTOSCOPY N/A 04/28/2016   Procedure: CYSTOSCOPY;  Surgeon: Norleen Skill, MD;  Location: [facility];  Service: Gynecology;  Laterality: N/A;   ESOPHAGEAL MANOMETRY N/A 11/16/2016   Procedure: ESOPHAGEAL MANOMETRY (EM);  Surgeon: Victory LITTIE Legrand DOUGLAS, MD;  [facility]: [facility];  Service: Gastroenterology;  Laterality: N/A;   ESOPHAGOGASTRODUODENOSCOPY     LAPAROSCOPIC CHOLECYSTECTOMY  1997   LAPAROSCOPY N/A 03/26/2016   Procedure: LAPAROSCOPY DIAGNOSTIC, LYSIS OF ADHESIONS;  Surgeon: Norleen Skill, MD;  Location: [facility];  Service: Gynecology;  Laterality: N/A;   LAPAROTOMY N/A 04/28/2016   Procedure: EXPLORATORY LAPAROTOMY;  Surgeon: Norleen Skill, MD;  Location: [facility];  Service: Gynecology;  Laterality: N/A;   RENAL BIOPSY     SALPINGOOPHORECTOMY Bilateral 04/28/2016   Procedure: Bilateral OOPHORECTOMY;  Surgeon: Norleen Skill, MD;  Location: [facility];  Service: Gynecology;  Laterality: Bilateral;   THYROIDECTOMY Left 1991   TRANSTHORACIC ECHOCARDIOGRAM  04/27/2012   normal LV, ef 60-65%/  trivial MR   TUBAL LIGATION  1992    ALLERGIES: Allergies  Allergen Reactions   Aspirin  Hives   Flagyl  [Metronidazole ] Nausea And Vomiting    With oral flagyl .   Has tolerated vaginal flagyl .     Food Other (See Comments)    Lima beans--  Positive allergy test    Tramadol  Other (See Comments)    Headache    Dilaudid  [Hydromorphone ] Itching    FAMILY HISTORY:  Family History  Problem Relation Age of Onset   Diabetes Mother        Pre-diabetic   Hypertension Mother    Hypertension Father    Coronary artery disease Father    Diabetes Father    Stroke Father    Prostate cancer Father    Heart disease Father    Breast cancer Maternal Aunt    Thyroid  disease Maternal Aunt    Colon cancer Neg Hx    Colon polyps Neg Hx    Kidney disease Neg Hx    Esophageal cancer Neg Hx    Gallbladder disease Neg Hx    Stomach cancer Neg Hx    Rectal cancer Neg Hx     SOCIAL HISTORY: Social History   Socioeconomic History   Marital status: Married    Spouse name: Not on file   Number of children: 2   Years of education: Not on file   Highest education level: 12th grade  Occupational History   Occupation: NURSING ASSISTANT    Employer:  [employer]  Tobacco Use   Smoking status: Never   Smokeless tobacco: Never  Vaping Use   Vaping status: Never Used  Substance and Sexual Activity   Alcohol use: No   Drug use: No   Sexual activity: Yes    Birth control/protection: Surgical  Other Topics Concern   Not on file  Social History Narrative   She is a Lawyer   Social Drivers of Corporate investment banker Strain: Low Risk  (04/04/2024)   Overall Financial Resource Strain (CARDIA)    Difficulty of Paying Living Expenses: Not hard at all  Food Insecurity: No Food Insecurity (04/04/2024)   Hunger Vital Sign    Worried About Running Out of Food  in the Last Year: Never true    Ran Out of Food in the Last Year: Never true  Transportation Needs: No Transportation Needs (04/04/2024)   PRAPARE - Administrator, Civil Service (Medical): No    Lack of Transportation (Non-Medical): No  Physical Activity: Insufficiently Active (04/04/2024)   Exercise Vital Sign    Days of Exercise per Week: 4 days    Minutes of Exercise per Session: 30 min  Stress: Stress Concern Present (04/04/2024)   [employer] of Occupational Health - Occupational Stress Questionnaire    Feeling of Stress: To some extent  Social Connections: Moderately Integrated (04/04/2024)   Social Connection and Isolation Panel    Frequency of Communication with Friends and Family: Three times a week    Frequency of Social Gatherings with Friends and Family: Once a week    Attends Religious Services: More than 4 times per year    Active Member of [employer] or Organizations: No    Attends Engineer, structural: Not on file    Marital Status: Married    MEDICATIONS:  Current Outpatient Medications  Medication Sig Dispense Refill   albuterol  (VENTOLIN  HFA) 108 (90 Base) MCG/ACT inhaler Inhale 2 puffs into the lungs every 6 (six) hours as needed. 6.7 g 1   ALPRAZolam  (XANAX ) 0.25 MG tablet Take 1 tablet (0.25 mg total) by mouth 2 (two) times  daily as needed for anxiety. 60 tablet 0   apixaban  (ELIQUIS ) 5 MG TABS tablet Take 1 tablet (5 mg total) by mouth 2 (two) times daily. 180 tablet 1   atorvastatin  (LIPITOR) 40 MG tablet TAKE 1 TABLET(40 MG) BY MOUTH DAILY 90 tablet 1   benazepril -hydrochlorthiazide (LOTENSIN  HCT) 20-25 MG tablet TAKE 1 TABLET BY MOUTH DAILY 90 tablet 1   cetirizine (ZYRTEC) 10 MG tablet Take 10 mg by mouth daily.     dicyclomine  (BENTYL ) 10 MG capsule Take 1 capsule (10 mg total) by mouth 3 (three) times daily before meals. 90 capsule 2   docusate sodium  (COLACE) 100 MG capsule Take 100 mg by mouth daily.     esomeprazole  (NEXIUM ) 40 MG capsule TAKE ONE CAPSULE BY MOUTH EVERY DAY AT 12 NOON. 90 capsule 1   Lancet Devices (ONETOUCH DELICA PLUS LANCING) MISC 1 each by Does not apply route daily. 100 each 12   Lancets (ONETOUCH DELICA PLUS LANCET33G) MISC USE AS DIRECTED ONCE DAILY 100 each 3   levothyroxine  (SYNTHROID ) 100 MCG tablet TAKE 1 TABLET(100 MCG) BY MOUTH DAILY BEFORE BREAKFAST 90 tablet 3   meclizine  (ANTIVERT ) 50 MG tablet Take 1 tablet (50 mg total) by mouth 3 (three) times daily as needed. 30 tablet 0   meloxicam  (MOBIC ) 7.5 MG tablet Take 1 tablet (7.5 mg total) by mouth daily. 10 tablet 0   metroNIDAZOLE  (METROGEL ) 0.75 % gel Apply 1 Application topically daily. 70 g 0   metroNIDAZOLE  (METROGEL ) 0.75 % vaginal gel Place 1 Applicatorful vaginally 2 (two) times daily. 70 g 0   ondansetron  (ZOFRAN -ODT) 4 MG disintegrating tablet Take 1 tablet (4 mg total) by mouth every 8 (eight) hours as needed for nausea or vomiting. 20 tablet 0   ONETOUCH ULTRA TEST test strip CHECK BLOOD GLUCOSE ONCE DAILY 100 strip 3   Peppermint Oil (IBGARD PO) Take 1 tablet by mouth daily.     Vitamin D , Ergocalciferol , (DRISDOL ) 1.25 MG (50000 UNIT) CAPS capsule TAKE 1 CAPSULE BY MOUTH EVERY 7 DAYS FOR 12 DOSES 12 capsule 0   No  current facility-administered medications for this visit.    PHYSICAL EXAM: Vitals:   05/09/24  1501  BP: 132/70  Pulse: 78  Resp: 20  SpO2: 94%  Weight: 197 lb 6.4 oz (89.5 kg)  Height: 5' 5 (1.651 m)    Body mass index is 32.85 kg/m.  Wt Readings from Last 3 Encounters:  05/09/24 197 lb 6.4 oz (89.5 kg)  05/05/24 196 lb (88.9 kg)  04/06/24 189 lb 9.5 oz (86 kg)    General: Well developed, well nourished female in no apparent distress.  HEENT: AT/[place], no external lesions. Hearing intact to the spoken word Eyes: EOMI. No stare, proptosis or lid lag. Conjunctiva clear and no icterus. Neck: Trachea midline, neck supple.  Anterior neck is scar present.  Small mobile nontender nodule area on the right submandibular area. Lungs: Clear to auscultation, no wheeze. Respirations not labored Heart: S1S2, Regular in rate and rhythm.  Abdomen: Soft, non tender, non distended Neurologic: Alert, oriented, normal speech, deep tendon biceps reflexes normal,  no gross focal neurological deficit Extremities: No pedal pitting edema, no tremors of outstretched hands Skin: Warm, color good.  Psychiatric: Does not appear depressed or anxious  PERTINENT HISTORIC LABORATORY AND IMAGING STUDIES:  All pertinent laboratory results were reviewed. Please see HPI also for further details.   TSH  Date Value Ref Range Status  05/09/2024 6.02 (H) mIU/L Final    Comment:              Reference Range .           > or = 20 Years  0.40-4.50 .                Pregnancy Ranges           First trimester    0.26-2.66           Second trimester   0.55-2.73           Third trimester    0.43-2.91   01/18/2024 5.18 0.35 - 5.50 uIU/mL Final  08/02/2023 3.81 mIU/L Final    Comment:              Reference Range .           > or = 20 Years  0.40-4.50 .                Pregnancy Ranges           First trimester    0.26-2.66           Second trimester   0.55-2.73           Third trimester    0.43-2.91    T3, Total  Date Value Ref Range Status  08/05/2018 106 76 - 181 ng/dL Final     ASSESSMENT /  PLAN  1. Postoperative hypothyroidism   2. History of thyroid  cancer     -Patient has remote history of thyroid  cancer status post left thyroidectomy in 1991.  Patient reports was treated with RAI therapy after 2 years of the surgery and denies having completion thyroidectomy.  No records of thyroid  cancer treatment and monitoring available to review.  Patient had thyroglobulin of 0.1.   -She is currently taking levothyroxine  100 mcg daily.  Plan: -Patient has small nodular area in the right submandibular region, possibly lymph node.  Patient also having some allergic type symptoms however no runny nose or sore throat.  Discussed to monitor and if it does not resolve in 2  to 3 months we will plan for soft tissue neck ultrasound, asked to call our clinic if it persist.  Less likely to be related to recurrence of thyroid  cancer. - Will check thyroid  function test along with thyroglobulin and thyroglobulin antibody. -Annual endocrinology follow-up.   Diagnoses and all orders for this visit:  Postoperative hypothyroidism -     T4, free -     TSH  History of thyroid  cancer -     Thyroglobulin, LC/MS/MS -     Thyroglobulin antibody     DISPOSITION Follow up in clinic in 12 months suggested.  All questions answered and patient verbalized understanding of the plan.  Rhonda Mekaela Azizi, MD [facility] Endocrinology [facility] [address], [address] [place], [place] [address] Phone # [phone]  At least part of this note was generated using voice recognition software. Inadvertent word errors may have occurred, which were not recognized during the proofreading process.

## 2024-05-14 LAB — T4, FREE: Free T4: 1.2 ng/dL (ref 0.8–1.8)

## 2024-05-14 LAB — THYROGLOBULIN ANTIBODY: Thyroglobulin Ab: <1 [IU]/mL (ref <=1)

## 2024-05-14 LAB — TSH: TSH: 6.02 m[IU]/L — ABNORMAL HIGH

## 2024-05-14 LAB — THYROGLOBULIN, LC/MS/MS: THYROGLOBULIN, LC/MS/MS: <0.4 ng/mL

## 2024-05-15 ENCOUNTER — Ambulatory Visit: Payer: Self-pay | Admitting: Endocrinology

## 2024-05-15 DIAGNOSIS — E890 Postprocedural hypothyroidism: Secondary | ICD-10-CM

## 2024-05-15 MED ORDER — LEVOTHYROXINE SODIUM 112 MCG PO TABS: 112.0000 ug | ORAL_TABLET | Freq: Every day | ORAL | 3 refills | Status: AC

## 2024-05-16 ENCOUNTER — Telehealth: Payer: Self-pay | Admitting: Orthopedic Surgery

## 2024-05-16 ENCOUNTER — Ambulatory Visit: Payer: Self-pay | Admitting: Internal Medicine

## 2024-05-16 NOTE — Telephone Encounter (Signed)
 Pt called about update on when she can schedule surgery. Pt phone number is [phone].

## 2024-05-17 ENCOUNTER — Ambulatory Visit: Payer: Self-pay | Admitting: Internal Medicine

## 2024-05-17 ENCOUNTER — Telehealth: Payer: Self-pay | Admitting: *Deleted

## 2024-05-17 ENCOUNTER — Encounter: Payer: Self-pay | Admitting: Internal Medicine

## 2024-05-17 VITALS — BP 110/80 | HR 80 | Temp 99.0°F | Wt 196.1 lb

## 2024-05-17 DIAGNOSIS — J302 Other seasonal allergic rhinitis: Secondary | ICD-10-CM

## 2024-05-17 DIAGNOSIS — Z23 Encounter for immunization: Secondary | ICD-10-CM

## 2024-05-17 DIAGNOSIS — J069 Acute upper respiratory infection, unspecified: Secondary | ICD-10-CM

## 2024-05-17 NOTE — Telephone Encounter (Signed)
 Patient would like to know if it is safe for her to get a tattoo, because she is taking Eliquis ?  Okay to answer by Mychart

## 2024-05-17 NOTE — Progress Notes (Signed)
 Established Patient Office Visit     CC/Reason for Visit: URI symptoms  HPI: Rhonda [name] is a 53 y.o. female who is coming in today for the above mentioned reasons.  She has fall allergies and is on a daily antihistamine.  About 2 weeks ago she started having congestion, runny nose, postnasal drip and a headache.  She tested for COVID twice and was negative.   Past Medical/Surgical History: Past Medical History:  Diagnosis Date   Allergy    Anxiety    C1q nephropathy    Cancer (HCC)    history of thyroid  cancer   Chronic constipation    CKD (chronic kidney disease), stage II nephrologist-  dr dominique pipe [facility])   secondary to C1q nephropathy/  hypertension   Family history of adverse reaction to anesthesia    father hard to put down for anesthesia   GERD (gastroesophageal reflux disease)    H/O radioactive iodine thyroid  ablation    1992  right throid   History of exercise intolerance    01-052011  ETT--  normal w/ no ischemia per cardiologist (dr rolan) epic note   History of pulmonary embolus (PE)    07/ 2008-- treated w/ coumadin   History of syncope    recurrent --  workup done per epic -- dx orthostatic hypotension   Hyperlipidemia    Hypertension    IBS (irritable bowel syndrome)    Neuromuscular disorder (HCC)    carpal tunnel   Pelvic pain in female    Post-surgical hypothyroidism    1991 left thyroidectomy   Pre-diabetes    Proteinuria    Seasonal and perennial allergic rhinitis    Wears contact lenses    Wears glasses     Past Surgical History:  Procedure Laterality Date   50 HOUR PH STUDY N/A 11/16/2016   Procedure: 24 HOUR PH STUDY;  Surgeon: Victory LITTIE Legrand DOUGLAS, MD;  Location: [facility];  Service: Gastroenterology;  Laterality: N/A;   ABDOMINAL HYSTERECTOMY  1998   COLONOSCOPY     COLONOSCOPY WITH PROPOFOL  N/A 08/05/2021   Procedure: COLONOSCOPY WITH PROPOFOL ;  Surgeon: Federico Rosario BROCKS, MD;  Location:  [facility];  Service: Gastroenterology;  Laterality: N/A;   CYSTOSCOPY N/A 04/28/2016   Procedure: CYSTOSCOPY;  Surgeon: Norleen Skill, MD;  Location: [facility];  Service: Gynecology;  Laterality: N/A;   ESOPHAGEAL MANOMETRY N/A 11/16/2016   Procedure: ESOPHAGEAL MANOMETRY (EM);  Surgeon: Victory LITTIE Legrand DOUGLAS, MD;  Location: [facility];  Service: Gastroenterology;  Laterality: N/A;   ESOPHAGOGASTRODUODENOSCOPY     LAPAROSCOPIC CHOLECYSTECTOMY  1997   LAPAROSCOPY N/A 03/26/2016   Procedure: LAPAROSCOPY DIAGNOSTIC, LYSIS OF ADHESIONS;  Surgeon: Norleen Skill, MD;  Location: [facility];  Service: Gynecology;  Laterality: N/A;   LAPAROTOMY N/A 04/28/2016   Procedure: EXPLORATORY LAPAROTOMY;  Surgeon: Norleen Skill, MD;  Location: [facility];  Service: Gynecology;  Laterality: N/A;   RENAL BIOPSY     SALPINGOOPHORECTOMY Bilateral 04/28/2016   Procedure: Bilateral OOPHORECTOMY;  Surgeon: Norleen Skill, MD;  Location: [facility];  Service: Gynecology;  Laterality: Bilateral;   THYROIDECTOMY Left 1991   TRANSTHORACIC ECHOCARDIOGRAM  04/27/2012   normal LV, ef 60-65%/  trivial MR   TUBAL LIGATION  1992    Social History:  reports that she has never smoked. She has never used smokeless tobacco. She reports that she does not drink alcohol and does not use drugs.  Allergies: Allergies  Allergen Reactions   Aspirin   Hives   Flagyl  [Metronidazole ] Nausea And Vomiting    With oral flagyl .   Has tolerated vaginal flagyl .     Food Other (See Comments)    Lima beans--  Positive allergy test    Tramadol  Other (See Comments)    Headache    Dilaudid  [Hydromorphone ] Itching    Family History:  Family History  Problem Relation Age of Onset   Diabetes Mother        Pre-diabetic   Hypertension Mother    Hypertension Father    Coronary artery disease Father    Diabetes Father    Stroke Father    Prostate cancer Father    Heart disease Father    Breast cancer Maternal Aunt    Thyroid  disease  Maternal Aunt    Colon cancer Neg Hx    Colon polyps Neg Hx    Kidney disease Neg Hx    Esophageal cancer Neg Hx    Gallbladder disease Neg Hx    Stomach cancer Neg Hx    Rectal cancer Neg Hx      Current Outpatient Medications:    albuterol  (VENTOLIN  HFA) 108 (90 Base) MCG/ACT inhaler, Inhale 2 puffs into the lungs every 6 (six) hours as needed., Disp: 6.7 g, Rfl: 1   ALPRAZolam  (XANAX ) 0.25 MG tablet, Take 1 tablet (0.25 mg total) by mouth 2 (two) times daily as needed for anxiety., Disp: 60 tablet, Rfl: 0   apixaban  (ELIQUIS ) 5 MG TABS tablet, Take 1 tablet (5 mg total) by mouth 2 (two) times daily., Disp: 180 tablet, Rfl: 1   atorvastatin  (LIPITOR) 40 MG tablet, TAKE 1 TABLET(40 MG) BY MOUTH DAILY, Disp: 90 tablet, Rfl: 1   benazepril -hydrochlorthiazide (LOTENSIN  HCT) 20-25 MG tablet, TAKE 1 TABLET BY MOUTH DAILY, Disp: 90 tablet, Rfl: 1   cetirizine (ZYRTEC) 10 MG tablet, Take 10 mg by mouth daily., Disp: , Rfl:    dicyclomine  (BENTYL ) 10 MG capsule, Take 1 capsule (10 mg total) by mouth 3 (three) times daily before meals., Disp: 90 capsule, Rfl: 2   docusate sodium  (COLACE) 100 MG capsule, Take 100 mg by mouth daily., Disp: , Rfl:    esomeprazole  (NEXIUM ) 40 MG capsule, TAKE ONE CAPSULE BY MOUTH EVERY DAY AT 12 NOON., Disp: 90 capsule, Rfl: 1   Lancet Devices (ONETOUCH DELICA PLUS LANCING) MISC, 1 each by Does not apply route daily., Disp: 100 each, Rfl: 12   Lancets (ONETOUCH DELICA PLUS LANCET33G) MISC, USE AS DIRECTED ONCE DAILY, Disp: 100 each, Rfl: 3   levothyroxine  (SYNTHROID ) 112 MCG tablet, Take 1 tablet (112 mcg total) by mouth daily., Disp: 90 tablet, Rfl: 3   meclizine  (ANTIVERT ) 50 MG tablet, Take 1 tablet (50 mg total) by mouth 3 (three) times daily as needed., Disp: 30 tablet, Rfl: 0   meloxicam  (MOBIC ) 7.5 MG tablet, Take 1 tablet (7.5 mg total) by mouth daily., Disp: 10 tablet, Rfl: 0   metroNIDAZOLE  (METROGEL ) 0.75 % gel, Apply 1 Application topically daily., Disp: 70  g, Rfl: 0   metroNIDAZOLE  (METROGEL ) 0.75 % vaginal gel, Place 1 Applicatorful vaginally 2 (two) times daily., Disp: 70 g, Rfl: 0   ondansetron  (ZOFRAN -ODT) 4 MG disintegrating tablet, Take 1 tablet (4 mg total) by mouth every 8 (eight) hours as needed for nausea or vomiting., Disp: 20 tablet, Rfl: 0   ONETOUCH ULTRA TEST test strip, CHECK BLOOD GLUCOSE ONCE DAILY, Disp: 100 strip, Rfl: 3   Peppermint Oil (IBGARD PO), Take 1 tablet by mouth daily., Disp: ,  Rfl:    Vitamin D , Ergocalciferol , (DRISDOL ) 1.25 MG (50000 UNIT) CAPS capsule, TAKE 1 CAPSULE BY MOUTH EVERY 7 DAYS FOR 12 DOSES, Disp: 12 capsule, Rfl: 0  Review of Systems:  Negative unless indicated in HPI.   Physical Exam: Vitals:   05/17/24 1604  BP: 110/80  Pulse: 80  Temp: 99 F (37.2 C)  TempSrc: Oral  SpO2: 96%  Weight: 196 lb 1.6 oz (89 kg)    Body mass index is 32.63 kg/m.   Physical Exam Vitals reviewed.  Constitutional:      Appearance: Normal appearance.  HENT:     Right Ear: Tympanic membrane, ear canal and external ear normal.     Left Ear: Tympanic membrane, ear canal and external ear normal.     Mouth/Throat:     Mouth: Mucous membranes are moist.     Pharynx: Oropharynx is clear.  Eyes:     Conjunctiva/sclera: Conjunctivae normal.  Cardiovascular:     Rate and Rhythm: Normal rate and regular rhythm.  Pulmonary:     Effort: Pulmonary effort is normal.     Breath sounds: Normal breath sounds.  Neurological:     Mental Status: She is alert.      Impression and Plan:  Upper respiratory tract infection, unspecified type  Seasonal allergies  Immunization due  - Suspect 2 weeks ago she had an unspecified viral URI on top of her baseline seasonal allergies.  Have advised continued use of daily antihistamine, add Mucinex and Flonase . - Flu vaccine administered today.   Time spent:22 minutes reviewing chart, interviewing and examining patient and formulating plan of care.     [name], MD [facility]

## 2024-05-17 NOTE — Addendum Note (Signed)
 Addended by: KATHRYNE MILLMAN B on: 05/17/2024 04:28 PM   Modules accepted: Orders

## 2024-05-24 ENCOUNTER — Other Ambulatory Visit: Payer: Self-pay | Admitting: Internal Medicine

## 2024-05-24 DIAGNOSIS — E785 Hyperlipidemia, unspecified: Secondary | ICD-10-CM

## 2024-05-25 ENCOUNTER — Other Ambulatory Visit: Payer: Self-pay

## 2024-05-25 DIAGNOSIS — M1811 Unilateral primary osteoarthritis of first carpometacarpal joint, right hand: Secondary | ICD-10-CM

## 2024-05-25 DIAGNOSIS — G5601 Carpal tunnel syndrome, right upper limb: Secondary | ICD-10-CM

## 2024-05-30 ENCOUNTER — Telehealth: Payer: Self-pay | Admitting: Cardiology

## 2024-05-30 ENCOUNTER — Telehealth: Payer: Self-pay

## 2024-05-30 NOTE — Telephone Encounter (Signed)
 Copied from CRM #[id number]. Topic: General - Other >> May 30, 2024  3:09 PM Suzen RAMAN wrote: Reason for CRM:  patient having carpal tunnel surgery 06/23/24 and was told to call to get a Lovenox  bridge because patient need to come off apixaban  (ELIQUIS ) 5 MG TABS tablet prior to the surgery.  CB# [phone]  if unable to reach patient a MyChart message can be sent or a detailed message can be left on cell phone

## 2024-05-30 NOTE — Telephone Encounter (Signed)
 Called patient back clearance is from Sept and office notes were forward to surgeon's office we provided medical clearance blood thinner is prescribed by another office

## 2024-05-30 NOTE — Telephone Encounter (Signed)
 Patient is calling to discuss her clearance for her surgery, the medication that she needs. Please advise

## 2024-05-30 NOTE — Telephone Encounter (Signed)
**Note De-identified  [place] Obfuscation** Please advise 

## 2024-05-31 ENCOUNTER — Encounter: Payer: Self-pay | Admitting: Internal Medicine

## 2024-05-31 NOTE — Telephone Encounter (Signed)
 Rhonda [name], [name], MD to Me    05/31/24  9:17 AM I agree with visit! Thanks for your help as always!  LVM for pt to return call to coumadin clinic.

## 2024-05-31 NOTE — Telephone Encounter (Signed)
 Eliquis  Indication: PE 2008 and 12/2022  Current Eliquis  dose; 5 mg BID  Actual Wt: 89 kg (> 156% of ideal wt; will use adjusted wt for CrCl calculation) Ideal Wt: 56 kg  Adjusted Wt: 70 kg  CrCl: 55.31 mL/min (meets parameter for once daily dosing at 1.5 x wt (kg)  Lovenox  dose recommendation; 150 mg Q24H or 100 mg Q12H  Lovenox  bridge;  11/3: Take last dose of Eliquis  until instructed to restart by surgeon 11/4: NO Eliquis , inject Lovenox  every 12 hours 11/5: NO Eliquis , inject Loveno every 12 hours 11/6: NO Eliquis , NO Lovenox   11/7: SURGERY; surgeon will advise pt when to restart Eliquis , No further Lovenox 

## 2024-05-31 NOTE — Telephone Encounter (Signed)
 I spoke with the patient on 10/9 and scheduled her for surgery on 06/23/24.

## 2024-06-01 NOTE — Telephone Encounter (Signed)
 Pt returned call. Advised of need for Lovenox  bridge. Pt agreed to use a Lovenox  bridge. Made pt apt with the coumadin clinic for education. Pt verbalized understanding.  Scheduled for 10/29.

## 2024-06-05 ENCOUNTER — Encounter: Payer: Self-pay | Admitting: Internal Medicine

## 2024-06-05 DIAGNOSIS — J302 Other seasonal allergic rhinitis: Secondary | ICD-10-CM

## 2024-06-05 DIAGNOSIS — J069 Acute upper respiratory infection, unspecified: Secondary | ICD-10-CM

## 2024-06-05 DIAGNOSIS — J301 Allergic rhinitis due to pollen: Secondary | ICD-10-CM

## 2024-06-05 NOTE — Telephone Encounter (Signed)
 This has been addressed and documentation in TE on 10/14

## 2024-06-12 ENCOUNTER — Telehealth: Payer: Self-pay | Admitting: Orthopedic Surgery

## 2024-06-12 NOTE — Telephone Encounter (Signed)
 received

## 2024-06-12 NOTE — Telephone Encounter (Signed)
 Pt submitted medical release form, FMLA and $20.00 payment

## 2024-06-13 ENCOUNTER — Telehealth: Payer: Self-pay | Admitting: Orthopedic Surgery

## 2024-06-13 NOTE — Telephone Encounter (Signed)
 The form that was received yesterday was not a form for the physician to complete, IC advised pt the form is for her to complete as it is employee accommodations request form. Patient will pick up form at front desk. A [employer] form is to be faxed, patient will email me auth for [employer] and the form payment will go to [facility] form. Patient expressed understanding.

## 2024-06-14 ENCOUNTER — Ambulatory Visit (INDEPENDENT_AMBULATORY_CARE_PROVIDER_SITE_OTHER): Payer: PRIVATE HEALTH INSURANCE

## 2024-06-14 ENCOUNTER — Ambulatory Visit: Payer: Self-pay

## 2024-06-14 DIAGNOSIS — Z7901 Long term (current) use of anticoagulants: Secondary | ICD-10-CM | POA: Diagnosis not present

## 2024-06-14 MED ORDER — ENOXAPARIN SODIUM 100 MG/ML IJ SOSY: 100.0000 mg | PREFILLED_SYRINGE | Freq: Two times a day (BID) | INTRAMUSCULAR | 0 refills | Status: DC

## 2024-06-14 NOTE — Patient Instructions (Addendum)
 Pre visit review using our clinic review tool, if applicable. No additional management support is needed unless otherwise documented below in the visit note.  Lovenox  bridge;   11/3: Take last dose of Eliquis  until instructed to restart by surgeon 11/4: NO Eliquis , inject Lovenox  every 12 hours 11/5: NO Eliquis , inject Loveno every 12 hours 11/6: NO Eliquis , NO Lovenox  Surgeon will advise when to restart Eliquis . No further Lovenox  is needed.

## 2024-06-14 NOTE — Progress Notes (Signed)
 Pt scheduled for arthroplasy, finger, on 1/7. PCP requested a Lovenox  bridge for the pt. Pt currently uses Eliquis  for anticoagulation. Lovenox  bridge has been approved by PCP.  Lovenox  bridge;   11/3: Take last dose of Eliquis  until instructed to restart by surgeon 11/4: NO Eliquis , inject Lovenox  every 12 hours 11/5: NO Eliquis , inject Loveno every 12 hours 11/6: NO Eliquis , NO Lovenox  Surgeon will advise when to restart Eliquis . No further Lovenox  is needed.  Educated pt on instructions for Lovenox  bridge. Educated pt on Lovenox  injections. All questions answered. Pt provided with direct number to coumadin clinic if any further questions. Pt verbalized understanding.  No further f/u with coumadin clinic is needed.  Sent in script for enoxaparin . Pharmacy was verified with pt.

## 2024-06-14 NOTE — Telephone Encounter (Signed)
 Pt was in today for Lovenox  bridge instructions and education. Prescription for Lovenox  was sent to preferred pharmacy.

## 2024-06-14 NOTE — Progress Notes (Signed)
 No further f/u with coumadin clinic is needed. Resolving anticoagulation episodes.

## 2024-06-16 ENCOUNTER — Ambulatory Visit (HOSPITAL_BASED_OUTPATIENT_CLINIC_OR_DEPARTMENT_OTHER): Admitting: Family

## 2024-06-16 ENCOUNTER — Encounter (HOSPITAL_BASED_OUTPATIENT_CLINIC_OR_DEPARTMENT_OTHER): Payer: Self-pay | Admitting: Orthopedic Surgery

## 2024-06-16 ENCOUNTER — Other Ambulatory Visit: Payer: Self-pay

## 2024-06-16 ENCOUNTER — Encounter (HOSPITAL_BASED_OUTPATIENT_CLINIC_OR_DEPARTMENT_OTHER): Payer: Self-pay

## 2024-06-16 ENCOUNTER — Ambulatory Visit: Payer: PRIVATE HEALTH INSURANCE | Admitting: Dietician

## 2024-06-19 ENCOUNTER — Encounter (HOSPITAL_BASED_OUTPATIENT_CLINIC_OR_DEPARTMENT_OTHER)
Admission: RE | Admit: 2024-06-19 | Discharge: 2024-06-19 | Disposition: A | Discharge status: Home / Self Care | Payer: PRIVATE HEALTH INSURANCE | Source: Ambulatory Visit | Attending: Orthopedic Surgery | Admitting: Orthopedic Surgery

## 2024-06-19 ENCOUNTER — Telehealth: Payer: Self-pay | Admitting: Orthopedic Surgery

## 2024-06-19 ENCOUNTER — Encounter: Payer: Self-pay | Admitting: Radiology

## 2024-06-19 DIAGNOSIS — Z01812 Encounter for preprocedural laboratory examination: Secondary | ICD-10-CM | POA: Diagnosis not present

## 2024-06-19 DIAGNOSIS — I10 Essential (primary) hypertension: Secondary | ICD-10-CM | POA: Insufficient documentation

## 2024-06-19 DIAGNOSIS — Z01818 Encounter for other preprocedural examination: Secondary | ICD-10-CM | POA: Diagnosis present

## 2024-06-19 LAB — BASIC METABOLIC PANEL WITH GFR
Anion gap: 9 (ref 5–15)
BUN: 22 mg/dL — ABNORMAL HIGH (ref 6–20)
CO2: 25 mmol/L (ref 22–32)
Calcium: 10.1 mg/dL (ref 8.9–10.3)
Chloride: 107 mmol/L (ref 98–111)
Creatinine, Ser: 1.45 mg/dL — ABNORMAL HIGH (ref 0.44–1.00)
GFR, Estimated: 43 mL/min — ABNORMAL LOW (ref >60)
Glucose, Bld: 99 mg/dL (ref 70–99)
Potassium: 3.9 mmol/L (ref 3.5–5.1)
Sodium: 141 mmol/L (ref 135–145)

## 2024-06-19 NOTE — Telephone Encounter (Signed)
 IC patient, advised patient that we still haven't received the [employer] form to complete for her STD . Patient will call them again today. Also, she requested to pickup copy of the Accommodations form. I placed copy at front desk to pickup.

## 2024-06-22 NOTE — Anesthesia Preprocedure Evaluation (Addendum)
 Anesthesia Evaluation  Patient identified by MRN, date of birth, ID band Patient awake    Reviewed: Allergy & Precautions, NPO status , Patient's Chart, lab work & pertinent test results  History of Anesthesia Complications Negative for: history of anesthetic complications  Airway Mallampati: II  TM Distance: >3 FB Neck ROM: Full    Dental  (+) Dental Advisory Given   Pulmonary asthma , COPD,  COPD inhaler   breath sounds clear to auscultation       Cardiovascular hypertension, Pt. on medications (-) angina + DVT   Rhythm:Regular Rate:Normal     Neuro/Psych   Anxiety     negative neurological ROS     GI/Hepatic Neg liver ROS,GERD  Medicated and Controlled,,  Endo/Other  Hypothyroidism    Renal/GU Renal InsufficiencyRenal disease     Musculoskeletal   Abdominal   Peds  Hematology Eliquis : last dose 06/19/2024 Monday   Anesthesia Other Findings   Reproductive/Obstetrics                              Anesthesia Physical Anesthesia Plan  ASA: 3  Anesthesia Plan: MAC and Regional   Post-op Pain Management: Tylenol  PO (pre-op)*   Induction:   PONV Risk Score and Plan: 2 and Treatment may vary due to age or medical condition and Ondansetron   Airway Management Planned: Natural Airway and Simple Face Mask  Additional Equipment: None  Intra-op Plan:   Post-operative Plan:   Informed Consent: I have reviewed the patients History and Physical, chart, labs and discussed the procedure including the risks, benefits and alternatives for the proposed anesthesia with the patient or authorized representative who has indicated his/her understanding and acceptance.     Dental advisory given  Plan Discussed with: CRNA and Surgeon  Anesthesia Plan Comments: (Plan routine monitors, supraclavicular block)         Anesthesia Quick Evaluation

## 2024-06-22 NOTE — Discharge Instructions (Signed)

## 2024-06-23 ENCOUNTER — Ambulatory Visit (HOSPITAL_BASED_OUTPATIENT_CLINIC_OR_DEPARTMENT_OTHER)
Admission: RE | Admit: 2024-06-23 | Discharge: 2024-06-23 | Disposition: A | Discharge status: Home / Self Care | Payer: PRIVATE HEALTH INSURANCE | Attending: Orthopedic Surgery | Admitting: Orthopedic Surgery

## 2024-06-23 ENCOUNTER — Encounter (HOSPITAL_BASED_OUTPATIENT_CLINIC_OR_DEPARTMENT_OTHER)
Admission: RE | Disposition: A | Discharge status: Home / Self Care | Payer: Self-pay | Source: Home / Self Care | Attending: Orthopedic Surgery

## 2024-06-23 ENCOUNTER — Encounter (HOSPITAL_BASED_OUTPATIENT_CLINIC_OR_DEPARTMENT_OTHER): Payer: Self-pay | Admitting: Anesthesiology

## 2024-06-23 ENCOUNTER — Encounter (HOSPITAL_BASED_OUTPATIENT_CLINIC_OR_DEPARTMENT_OTHER): Payer: Self-pay | Admitting: Orthopedic Surgery

## 2024-06-23 ENCOUNTER — Ambulatory Visit (HOSPITAL_BASED_OUTPATIENT_CLINIC_OR_DEPARTMENT_OTHER): Payer: Self-pay | Admitting: Anesthesiology

## 2024-06-23 ENCOUNTER — Other Ambulatory Visit: Payer: Self-pay

## 2024-06-23 ENCOUNTER — Ambulatory Visit (HOSPITAL_BASED_OUTPATIENT_CLINIC_OR_DEPARTMENT_OTHER): Payer: PRIVATE HEALTH INSURANCE

## 2024-06-23 DIAGNOSIS — N182 Chronic kidney disease, stage 2 (mild): Secondary | ICD-10-CM | POA: Insufficient documentation

## 2024-06-23 DIAGNOSIS — Z7901 Long term (current) use of anticoagulants: Secondary | ICD-10-CM | POA: Diagnosis not present

## 2024-06-23 DIAGNOSIS — M25531 Pain in right wrist: Secondary | ICD-10-CM

## 2024-06-23 DIAGNOSIS — K219 Gastro-esophageal reflux disease without esophagitis: Secondary | ICD-10-CM | POA: Diagnosis not present

## 2024-06-23 DIAGNOSIS — I129 Hypertensive chronic kidney disease with stage 1 through stage 4 chronic kidney disease, or unspecified chronic kidney disease: Secondary | ICD-10-CM | POA: Diagnosis not present

## 2024-06-23 DIAGNOSIS — J4489 Other specified chronic obstructive pulmonary disease: Secondary | ICD-10-CM | POA: Diagnosis not present

## 2024-06-23 DIAGNOSIS — Z86718 Personal history of other venous thrombosis and embolism: Secondary | ICD-10-CM | POA: Insufficient documentation

## 2024-06-23 DIAGNOSIS — M189 Osteoarthritis of first carpometacarpal joint, unspecified: Secondary | ICD-10-CM

## 2024-06-23 DIAGNOSIS — G5601 Carpal tunnel syndrome, right upper limb: Secondary | ICD-10-CM | POA: Diagnosis not present

## 2024-06-23 DIAGNOSIS — N183 Chronic kidney disease, stage 3 unspecified: Secondary | ICD-10-CM | POA: Diagnosis not present

## 2024-06-23 DIAGNOSIS — M1811 Unilateral primary osteoarthritis of first carpometacarpal joint, right hand: Secondary | ICD-10-CM | POA: Insufficient documentation

## 2024-06-23 DIAGNOSIS — I10 Essential (primary) hypertension: Secondary | ICD-10-CM

## 2024-06-23 DIAGNOSIS — Z79899 Other long term (current) drug therapy: Secondary | ICD-10-CM | POA: Insufficient documentation

## 2024-06-23 DIAGNOSIS — E039 Hypothyroidism, unspecified: Secondary | ICD-10-CM | POA: Diagnosis not present

## 2024-06-23 DIAGNOSIS — J449 Chronic obstructive pulmonary disease, unspecified: Secondary | ICD-10-CM | POA: Diagnosis not present

## 2024-06-23 DIAGNOSIS — F419 Anxiety disorder, unspecified: Secondary | ICD-10-CM | POA: Diagnosis not present

## 2024-06-23 HISTORY — PX: CARPAL TUNNEL RELEASE: SHX101

## 2024-06-23 HISTORY — PX: FINGER ARTHROPLASTY: SHX5017

## 2024-06-23 SURGERY — ARTHROPLASTY, FINGER
Anesthesia: Monitor Anesthesia Care | Site: Wrist | Laterality: Right

## 2024-06-23 MED ORDER — LACTATED RINGERS IV SOLN
INTRAVENOUS | Status: DC
Start: 2024-06-23 — End: 2024-06-23

## 2024-06-23 MED ORDER — DEXMEDETOMIDINE HCL IN NACL 80 MCG/20ML IV SOLN
INTRAVENOUS | Status: AC
Start: 2024-06-23 — End: 2024-06-23
  Filled 2024-06-23: qty 20

## 2024-06-23 MED ORDER — OXYCODONE HCL 5 MG PO TABS: 5.0000 mg | ORAL_TABLET | Freq: Once | ORAL | Status: DC | PRN

## 2024-06-23 MED ORDER — MEPERIDINE HCL 25 MG/ML IJ SOLN: 6.2500 mg | INTRAMUSCULAR | Status: DC | PRN

## 2024-06-23 MED ORDER — MIDAZOLAM HCL 2 MG/2ML IJ SOLN
INTRAMUSCULAR | Status: AC
  Filled 2024-06-23: qty 2

## 2024-06-23 MED ORDER — PROPOFOL 500 MG/50ML IV EMUL
INTRAVENOUS | Status: DC | PRN
  Administered 2024-06-23: 100 ug/kg/min via INTRAVENOUS

## 2024-06-23 MED ORDER — PROPOFOL 10 MG/ML IV BOLUS
INTRAVENOUS | Status: AC
Start: 2024-06-23 — End: 2024-06-23
  Filled 2024-06-23: qty 20

## 2024-06-23 MED ORDER — DEXMEDETOMIDINE HCL IN NACL 80 MCG/20ML IV SOLN
INTRAVENOUS | Status: DC | PRN
  Administered 2024-06-23: 12 ug via INTRAVENOUS

## 2024-06-23 MED ORDER — OXYCODONE HCL 5 MG PO TABS: 5.0000 mg | ORAL_TABLET | Freq: Four times a day (QID) | ORAL | 0 refills | Status: DC | PRN

## 2024-06-23 MED ORDER — CEFAZOLIN SODIUM-DEXTROSE 2-3 GM-%(50ML) IV SOLR
INTRAVENOUS | Status: DC | PRN
  Administered 2024-06-23: 2 g via INTRAVENOUS

## 2024-06-23 MED ORDER — BUPIVACAINE-EPINEPHRINE (PF) 0.5% -1:200000 IJ SOLN
INTRAMUSCULAR | Status: DC | PRN
  Administered 2024-06-23: 25 mL via PERINEURAL

## 2024-06-23 MED ORDER — FENTANYL CITRATE (PF) 100 MCG/2ML IJ SOLN
INTRAMUSCULAR | Status: AC
  Filled 2024-06-23: qty 2

## 2024-06-23 MED ORDER — FENTANYL CITRATE (PF) 100 MCG/2ML IJ SOLN: 25.0000 ug | INTRAMUSCULAR | Status: DC | PRN

## 2024-06-23 MED ORDER — CEFAZOLIN SODIUM-DEXTROSE 2-4 GM/100ML-% IV SOLN: 2.0000 g | INTRAVENOUS | Status: DC

## 2024-06-23 MED ORDER — ACETAMINOPHEN 500 MG PO TABS
ORAL_TABLET | ORAL | Status: AC
  Filled 2024-06-23: qty 2

## 2024-06-23 MED ORDER — ONDANSETRON HCL 4 MG/2ML IJ SOLN
INTRAMUSCULAR | Status: DC | PRN
  Administered 2024-06-23: 4 mg via INTRAVENOUS

## 2024-06-23 MED ORDER — ONDANSETRON HCL 4 MG/2ML IJ SOLN
INTRAMUSCULAR | Status: AC
  Filled 2024-06-23: qty 2

## 2024-06-23 MED ORDER — OXYCODONE HCL 5 MG/5ML PO SOLN: 5.0000 mg | Freq: Once | ORAL | Status: DC | PRN

## 2024-06-23 MED ORDER — LACTATED RINGERS IV SOLN: INTRAVENOUS | Status: DC | PRN

## 2024-06-23 MED ORDER — CEFAZOLIN SODIUM-DEXTROSE 2-4 GM/100ML-% IV SOLN
INTRAVENOUS | Status: AC
  Filled 2024-06-23: qty 100

## 2024-06-23 MED ORDER — LIDOCAINE 2% (20 MG/ML) 5 ML SYRINGE
INTRAMUSCULAR | Status: AC
  Filled 2024-06-23: qty 5

## 2024-06-23 MED ORDER — FENTANYL CITRATE (PF) 100 MCG/2ML IJ SOLN
100.0000 ug | Freq: Once | INTRAMUSCULAR | Status: AC
  Administered 2024-06-23: 100 ug via INTRAVENOUS

## 2024-06-23 MED ORDER — MIDAZOLAM HCL (PF) 2 MG/2ML IJ SOLN
2.0000 mg | Freq: Once | INTRAMUSCULAR | Status: AC
  Administered 2024-06-23: 2 mg via INTRAVENOUS

## 2024-06-23 MED ORDER — ACETAMINOPHEN 500 MG PO TABS
1000.0000 mg | ORAL_TABLET | Freq: Once | ORAL | Status: AC
  Administered 2024-06-23: 1000 mg via ORAL

## 2024-06-23 MED ORDER — MIDAZOLAM HCL (PF) 2 MG/2ML IJ SOLN: 0.5000 mg | Freq: Once | INTRAMUSCULAR | Status: DC | PRN

## 2024-06-23 MED ORDER — 0.9 % SODIUM CHLORIDE (POUR BTL) OPTIME
TOPICAL | Status: DC | PRN
  Administered 2024-06-23: 500 mL

## 2024-06-23 SURGICAL SUPPLY — 61 items
ANCHOR REPAIR HAND WRIST (Anchor) IMPLANT
BLADE MINI RND TIP GREEN BEAV (BLADE) ×3 IMPLANT
BLADE SURG 15 STRL LF DISP TIS (BLADE) ×6 IMPLANT
BNDG COHESIVE 4X5 TAN STRL LF (GAUZE/BANDAGES/DRESSINGS) ×3 IMPLANT
BNDG COMPR ESMARK 4X3 LF (GAUZE/BANDAGES/DRESSINGS) ×3 IMPLANT
BNDG ELASTIC 2INX 5YD STR LF (GAUZE/BANDAGES/DRESSINGS) IMPLANT
BNDG ELASTIC 4INX 5YD STR LF (GAUZE/BANDAGES/DRESSINGS) ×6 IMPLANT
BNDG GAUZE DERMACEA FLUFF 4 (GAUZE/BANDAGES/DRESSINGS) ×3 IMPLANT
CHLORAPREP W/TINT 26 (MISCELLANEOUS) ×3 IMPLANT
CLSR STERI-STRIP ANTIMIC 1/2X4 (GAUZE/BANDAGES/DRESSINGS) ×3 IMPLANT
CORD BIPOLAR FORCEPS 12FT (ELECTRODE) ×3 IMPLANT
COVER BACK TABLE 60X90IN (DRAPES) ×3 IMPLANT
CUFF TOURN SGL QUICK 18X4 (TOURNIQUET CUFF) ×3 IMPLANT
DERMABOND ADVANCED .7 DNX12 (GAUZE/BANDAGES/DRESSINGS) IMPLANT
DRAPE HAND 77X146 (DRAPES) ×3 IMPLANT
DRAPE OEC MINIVIEW 54X84 (DRAPES) ×3 IMPLANT
DRAPE SURG 17X23 STRL (DRAPES) ×3 IMPLANT
GAUZE 4X4 16PLY ~~LOC~~+RFID DBL (SPONGE) ×3 IMPLANT
GAUZE PAD ABD 8X10 STRL (GAUZE/BANDAGES/DRESSINGS) ×3 IMPLANT
GAUZE SPONGE 4X4 12PLY STRL (GAUZE/BANDAGES/DRESSINGS) ×3 IMPLANT
GAUZE STRETCH 2X75IN STRL (MISCELLANEOUS) ×3 IMPLANT
GAUZE XEROFORM 1X8 LF (GAUZE/BANDAGES/DRESSINGS) ×3 IMPLANT
GLOVE BIO SURGEON STRL SZ7.5 (GLOVE) ×6 IMPLANT
GLOVE BIOGEL PI IND STRL 7.5 (GLOVE) ×6 IMPLANT
GOWN STRL REUS W/ TWL LRG LVL3 (GOWN DISPOSABLE) ×6 IMPLANT
GOWN STRL SURGICAL XL XLNG (GOWN DISPOSABLE) ×6 IMPLANT
KWIRE DBL .062X4 NSTRL (WIRE) ×3 IMPLANT
MANIFOLD NEPTUNE II (INSTRUMENTS) ×3 IMPLANT
NDL HYPO 25X1 1.5 SAFETY (NEEDLE) IMPLANT
NDL HYPO 25X5/8 SAFETYGLIDE (NEEDLE) IMPLANT
NDL SUT 6 .5 CRC .975X.05 MAYO (NEEDLE) IMPLANT
NEEDLE HYPO 25X1 1.5 SAFETY (NEEDLE) IMPLANT
NEEDLE HYPO 25X5/8 SAFETYGLIDE (NEEDLE) IMPLANT
PACK BASIN DAY SURGERY FS (CUSTOM PROCEDURE TRAY) ×3 IMPLANT
PAD CAST 4YDX4 CTTN HI CHSV (CAST SUPPLIES) ×3 IMPLANT
PAD CAST CTTN 4X4 STRL (SOFTGOODS) IMPLANT
PIN GUARD 1.57MM GREEN STERILE (PIN) ×3 IMPLANT
SHEET MEDIUM DRAPE 40X70 STRL (DRAPES) ×3 IMPLANT
SLEEVE SCD COMPRESS KNEE MED (STOCKING) ×3 IMPLANT
SOLN 0.9% NACL POUR BTL 1000ML (IV SOLUTION) ×3 IMPLANT
SPIKE FLUID TRANSFER (MISCELLANEOUS) IMPLANT
SPLINT FIBERGLASS 4X30 (CAST SUPPLIES) IMPLANT
SPLINT PLASTER CAST XFAST 4X15 (CAST SUPPLIES) ×3 IMPLANT
SPONGE SURGIFOAM ABS GEL 12-7 (HEMOSTASIS) ×3 IMPLANT
STOCKINETTE IMPERVIOUS 9X36 MD (GAUZE/BANDAGES/DRESSINGS) ×3 IMPLANT
SUCTION TUBE FRAZIER 10FR DISP (SUCTIONS) ×3 IMPLANT
SUT ETHILON 4 0 PS 2 18 (SUTURE) ×3 IMPLANT
SUT MNCRL AB 3-0 PS2 18 (SUTURE) IMPLANT
SUT MNCRL AB 3-0 PS2 27 (SUTURE) ×3 IMPLANT
SUT MNCRL AB 4-0 PS2 18 (SUTURE) ×3 IMPLANT
SUT SILK 3 0 PS 1 (SUTURE) IMPLANT
SUT VIC AB 3-0 CT1 27XBRD (SUTURE) IMPLANT
SUT VIC AB 3-0 PS2 18XBRD (SUTURE) ×3 IMPLANT
SUT VIC AB 3-0 SH 27X BRD (SUTURE) IMPLANT
SUTURE 0 FIBERLP 38 BLU TPR ND (SUTURE) IMPLANT
SYR BULB EAR ULCER 3OZ GRN STR (SYRINGE) ×6 IMPLANT
SYR CONTROL 10ML LL (SYRINGE) ×3 IMPLANT
TAPE SUT LABRALTAP WHT/BLK (SUTURE) IMPLANT
TOWEL GREEN STERILE FF (TOWEL DISPOSABLE) ×6 IMPLANT
TUBE CONNECTING 20X1/4 (TUBING) ×3 IMPLANT
UNDERPAD 30X36 HEAVY ABSORB (UNDERPADS AND DIAPERS) ×3 IMPLANT

## 2024-06-23 NOTE — Progress Notes (Signed)
Assisted Dr. Carswell Jackson with right, supraclavicular, ultrasound guided block. Side rails up, monitors on throughout procedure. See vital signs in flow sheet. Tolerated Procedure well. ?

## 2024-06-23 NOTE — H&P (Signed)
 @LOGODEPT @  Rhonda [name] - 53 y.o. female MRN [id number]  Date of birth: 04-16-1971   HAND SURGERY H&P UPDATE   HPI: Patient is a 53 y.o. female with right thumb basilar joint pain and associated numbness and tingling in the right hand.  Her symptoms have been present now for greater than a year, she denies any significant injury or inciting event.     She has undergone prior right thumb CMC injection with minimal relief has also trialed bracing without relief.  She is having nocturnal symptoms ongoing, numbness on the radial aspect of the hand in particular.  Plan for right thumb CMC arthroplasty and open carpal tunnel release today.   Past Medical History:  Diagnosis Date   Allergy    Anxiety    C1q nephropathy    Cancer (HCC)    history of thyroid  cancer   Chronic constipation    CKD (chronic kidney disease), stage II nephrologist-  dr dominique pipe [facility])   secondary to C1q nephropathy/  hypertension   Family history of adverse reaction to anesthesia    father hard to put down for anesthesia   GERD (gastroesophageal reflux disease)    H/O radioactive iodine thyroid  ablation    1992  right throid   History of exercise intolerance    01-052011  ETT--  normal w/ no ischemia per cardiologist (dr rolan) epic note   History of pulmonary embolus (PE)    07/ 2008-- treated w/ coumadin   History of syncope    recurrent --  workup done per epic -- dx orthostatic hypotension   Hyperlipidemia    Hypertension    IBS (irritable bowel syndrome)    Neuromuscular disorder (HCC)    carpal tunnel   Pelvic pain in female    Post-surgical hypothyroidism    1991 left thyroidectomy   Pre-diabetes    Proteinuria    Seasonal and perennial allergic rhinitis    Wears contact lenses    Wears glasses    Past Surgical History:  Procedure Laterality Date   29 HOUR PH STUDY N/A 11/16/2016   Procedure: 24 HOUR PH STUDY;  Surgeon: Victory LITTIE Legrand DOUGLAS, MD;   Location: [facility];  Service: Gastroenterology;  Laterality: N/A;   ABDOMINAL HYSTERECTOMY  1998   COLONOSCOPY     COLONOSCOPY WITH PROPOFOL  N/A 08/05/2021   Procedure: COLONOSCOPY WITH PROPOFOL ;  Surgeon: Federico Rosario BROCKS, MD;  Location: [facility];  Service: Gastroenterology;  Laterality: N/A;   CYSTOSCOPY N/A 04/28/2016   Procedure: CYSTOSCOPY;  Surgeon: Norleen Skill, MD;  Location: [facility];  Service: Gynecology;  Laterality: N/A;   ESOPHAGEAL MANOMETRY N/A 11/16/2016   Procedure: ESOPHAGEAL MANOMETRY (EM);  Surgeon: Victory LITTIE Legrand DOUGLAS, MD;  Location: [facility];  Service: Gastroenterology;  Laterality: N/A;   ESOPHAGOGASTRODUODENOSCOPY     LAPAROSCOPIC CHOLECYSTECTOMY  1997   LAPAROSCOPY N/A 03/26/2016   Procedure: LAPAROSCOPY DIAGNOSTIC, LYSIS OF ADHESIONS;  Surgeon: Norleen Skill, MD;  Location: [facility];  Service: Gynecology;  Laterality: N/A;   LAPAROTOMY N/A 04/28/2016   Procedure: EXPLORATORY LAPAROTOMY;  Surgeon: Norleen Skill, MD;  Location: [facility];  Service: Gynecology;  Laterality: N/A;   RENAL BIOPSY     SALPINGOOPHORECTOMY Bilateral 04/28/2016   Procedure: Bilateral OOPHORECTOMY;  Surgeon: Norleen Skill, MD;  Location: [facility];  Service: Gynecology;  Laterality: Bilateral;   THYROIDECTOMY Left 1991   TRANSTHORACIC ECHOCARDIOGRAM  04/27/2012   normal LV, ef 60-65%/  trivial MR   TUBAL LIGATION  1992   Social History   Socioeconomic History   Marital status: Married    Spouse name: Not on file   Number of children: 2   Years of education: Not on file   Highest education level: 12th grade  Occupational History   Occupation: NURSING ASSISTANT    Employer: [employer]  Tobacco Use   Smoking status: Never   Smokeless tobacco: Never  Vaping Use   Vaping status: Never Used  Substance and Sexual Activity   Alcohol use: No   Drug use: No   Sexual activity: Yes    Birth control/protection: Surgical    Comment: hyst  Other Topics Concern   Not  on file  Social History Narrative   She is a LAWYER   Social Drivers of Corporate Investment Banker Strain: Low Risk  (04/04/2024)   Overall Financial Resource Strain (CARDIA)    Difficulty of Paying Living Expenses: Not hard at all  Food Insecurity: No Food Insecurity (04/04/2024)   Hunger Vital Sign    Worried About Running Out of Food in the Last Year: Never true    Ran Out of Food in the Last Year: Never true  Transportation Needs: No Transportation Needs (04/04/2024)   PRAPARE - Administrator, Civil Service (Medical): No    Lack of Transportation (Non-Medical): No  Physical Activity: Insufficiently Active (04/04/2024)   Exercise Vital Sign    Days of Exercise per Week: 4 days    Minutes of Exercise per Session: 30 min  Stress: Stress Concern Present (04/04/2024)   [employer] of Occupational Health - Occupational Stress Questionnaire    Feeling of Stress: To some extent  Social Connections: Moderately Integrated (04/04/2024)   Social Connection and Isolation Panel    Frequency of Communication with Friends and Family: Three times a week    Frequency of Social Gatherings with Friends and Family: Once a week    Attends Religious Services: More than 4 times per year    Active Member of [employer] or Organizations: No    Attends Engineer, Structural: Not on file    Marital Status: Married   Family History  Problem Relation Age of Onset   Diabetes Mother        Pre-diabetic   Hypertension Mother    Hypertension Father    Coronary artery disease Father    Diabetes Father    Stroke Father    Prostate cancer Father    Heart disease Father    Breast cancer Maternal Aunt    Thyroid  disease Maternal Aunt    Colon cancer Neg Hx    Colon polyps Neg Hx    Kidney disease Neg Hx    Esophageal cancer Neg Hx    Gallbladder disease Neg Hx    Stomach cancer Neg Hx    Rectal cancer Neg Hx    - negative except otherwise stated in the family history section Allergies   Allergen Reactions   Aspirin  Hives   Flagyl  [Metronidazole ] Nausea And Vomiting    With oral flagyl .   Has tolerated vaginal flagyl .     Food Other (See Comments)    Lima beans--  Positive allergy test    Tramadol  Other (See Comments)    Headache    Dilaudid  [Hydromorphone ] Itching   Prior to Admission medications   Medication Sig Start Date End Date Taking? Authorizing Provider  apixaban  (ELIQUIS ) 5 MG TABS tablet Take 1 tablet (5 mg total) by mouth 2 (  two) times daily. 04/12/24  Yes Theophilus Andrews, [name], MD  atorvastatin  (LIPITOR) 40 MG tablet TAKE 1 TABLET(40 MG) BY MOUTH DAILY 05/24/24  Yes Theophilus Andrews, [name], MD  benazepril -hydrochlorthiazide (LOTENSIN  HCT) 20-25 MG tablet TAKE 1 TABLET BY MOUTH DAILY 03/21/24  Yes Theophilus Andrews, [name], MD  cetirizine (ZYRTEC) 10 MG tablet Take 10 mg by mouth daily.   Yes [provider]  docusate sodium  (COLACE) 100 MG capsule Take 100 mg by mouth daily.   Yes [provider]  esomeprazole  (NEXIUM ) 40 MG capsule TAKE ONE CAPSULE BY MOUTH EVERY DAY AT 12 NOON. 05/24/24  Yes Theophilus Andrews, [name], MD  levothyroxine  (SYNTHROID ) 112 MCG tablet Take 1 tablet (112 mcg total) by mouth daily. 05/15/24  Yes Thapa, Sudan, MD  meclizine  (ANTIVERT ) 50 MG tablet Take 1 tablet (50 mg total) by mouth 3 (three) times daily as needed. 04/06/24  Yes Theophilus Andrews, [name], MD  Peppermint Oil (IBGARD PO) Take 1 tablet by mouth daily.   Yes [provider]  albuterol  (VENTOLIN  HFA) 108 (90 Base) MCG/ACT inhaler Inhale 2 puffs into the lungs every 6 (six) hours as needed. 06/15/22   Theophilus Andrews, [name], MD  ALPRAZolam  (XANAX ) 0.25 MG tablet Take 1 tablet (0.25 mg total) by mouth 2 (two) times daily as needed for anxiety. 12/19/19   Theophilus Andrews, [name], MD  enoxaparin  (LOVENOX ) 100 MG/ML injection Inject 1 mL (100 mg total) into the skin every 12 (twelve) hours. AS DIRECTED BY ANTICOAGULATION CLINIC 06/14/24    Theophilus Andrews, [name], MD  Lancet Devices [facility] PLUS LANCING) MISC 1 each by Does not apply route daily. 10/13/21   Theophilus Andrews, [name], MD  Lancets [facility] DELICA PLUS [place]) MISC USE AS DIRECTED ONCE DAILY 02/22/24   Theophilus Andrews, [name], MD  metroNIDAZOLE  (METROGEL ) 0.75 % gel Apply 1 Application topically daily. 04/19/24   Theophilus Andrews, [name], MD  metroNIDAZOLE  (METROGEL ) 0.75 % vaginal gel Place 1 Applicatorful vaginally 2 (two) times daily. 04/23/24   Theophilus Andrews, [name], MD  ondansetron  (ZOFRAN -ODT) 4 MG disintegrating tablet Take 1 tablet (4 mg total) by mouth every 8 (eight) hours as needed for nausea or vomiting. 10/18/23   Moishe Chiquita HERO, NP  ONETOUCH ULTRA TEST test strip CHECK BLOOD GLUCOSE ONCE DAILY 02/22/24   Theophilus Andrews, [name], MD   No results found. - Positive ROS: All other systems have been reviewed and were otherwise negative with the exception of those mentioned in the HPI and as above.  Physical Exam: General: No acute distress, resting comfortably Cardiovascular: BUE warm and well perfused, normal rate Respiratory: Normal WOB on RA Skin: Warm and dry Neurologic: Sensation intact distally Psychiatric: Patient is at baseline mood and affect  Right Upper Extremity  Right hand: - Moderate tenderness over the thumb CMC region with deep palpation, positive grind for pain and crepitus, no significant MP hyperextension - Able to form composite fist without restriction - Moderate tenderness noted over the scapholunate interval dorsally - Watson test does demonstrate some pain, negative for instability or clunk - Mild tenderness over the foveal region, DRUJ stable in all planes - Right wrist positive Tinel's carpal tunnel, positive Phalen's, positive Durkan's compression - 2-point discrimination in the median nerve distribution is greater than 8 mm   Assessment/Plan: OR today for right thumb CMC arthroplasty and open carpal  tunnel release. We again reviewed the risks of surgery which include bleeding, infection, damage to  neurovascular structures, persistent symptoms, need for additional surgery.  Informed consent was signed.  All questions were answered.   [facility], Hand Surgery

## 2024-06-23 NOTE — Op Note (Signed)
 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   ASSISTANT: Joesph Dinsmore, OPA   ANESTHESIA:  Regional with sedation   INTRAVENOUS FLUIDS:  Per anesthesia flow sheet.   ESTIMATED BLOOD LOSS:  Minimal.   COMPLICATIONS:  None.   SPECIMENS:  none   TOURNIQUET TIME:    Total Tourniquet Time Documented: Upper Arm (Right) - 34 minutes Total: Upper Arm (Right) - 34 minutes    DISPOSITION:  Stable to PACU.   INDICATIONS: 53 year old female who is seen in the outpatient setting with right thumb carpometacarpal arthritis refractory to conservative care.  Patient was also found to have clinical evidence of right carpal tunnel syndrome refractory to conservative care.  Patient was indicated for right thumb carpometacarpal arthroplasty with associated carpal tunnel release.  Risks and benefits of surgery were discussed including the risks of infection, bleeding, scarring, stiffness, nerve injury, vascular injury, tendon injury, need for subsequent operation, persistent symptoms, thumb subsidence, recurrence.  She voiced understanding of these risks and elected to proceed.  OPERATIVE COURSE: Patient was seen and identified in the preoperative area and marked appropriately.  Surgical consent had been signed. Preoperative IV antibiotic prophylaxis  was given. She was transferred to the operating room and placed in supine position with the right upper extremity on an arm board.  Sedation was induced by the anesthesiologist. A regional block had been performed by anesthesia in preoperative holding.    Right Right upper extremity was prepped and draped in normal sterile orthopedic fashion.  A surgical pause was performed between the surgeons, anesthesia, and operating room staff and all were in agreement as to the patient, procedure, and site of procedure.  Tourniquet was placed and padded appropriately to the right upper arm.  The arm was exsanguinated and the tourniquet was inflated to 250 mmHg.  We first began with the right carpal tunnel release.  Longitudinal incision was designed in the thenar crease in line with the radial border of the ring finger, from intersection of Kaplan's cardinal line down to level of the distal wrist crease. Incision was carried down utilizing 15 blade. Blunt dissection was performed, palmar fascia was identified and incised sharply utilizing a Beaver blade. Careful dissection was performed down, thenar musculature was bluntly elevated and the transcarpal ligament was identified.  A Beaver blade was then utilized to divide the transcarpal ligament in a distal to proximal fashion.  Fat surrounding the palmar arch was encountered to confirm appropriate distal release.  At the level of the wrist crease, skin flaps were elevated to allow for release of the proximal portion of transverse carpal ligament as well as the antebrachial fascia into the forearm.  Significant hourglass deformity of the median nerve was appreciated intraoperatively, indicating ongoing compression.  Appropriate decompression was noted of the median nerve, care was taken to protect the nerve in its entirety throughout.     We then turned our attention to the right thumb CMC arthroplasty.  A straight line incision was  made over the dorsum of the right thumb  CMC joint at the glabrous/nonglabrous junction.  Crossing branches of the radial sensory nerve were identified and carefully protected.  The radial artery was identified and protected.  The first extensor compartment was first released.  The tendons of the first extensor compartment were identified within the incisional site.  The dorsal most aspect of the tendon sheath was then released and carried down over the radial styloid.   Following release of the first extensor compartment, CMC arthrotomy was completed.  Thick capsular flaps were elevated both radially and ulnarly.  The trapezium was identified and confirmed with biplanar fluoroscopy.  This was carefully freed and dissected, then removed utilizing a rongeur.  Care was taken to avoid injury to the underlying flexor carpi radialis tendon.   At this point, the Arthrex internal brace was utilized.  Base of the index metacarpal was identified utilizing biplanar fluoroscopy, the K wire was inserted into the nonarticular portion followed by drill and the first suture anchor.  Biplanar fluoroscopy was utilized to confirm appropriate positioning of the wire prior to anchor placement.  Stability was noted to this suture anchor.  Subsequently, the thumb metacarpal base was identified followed by placement of the additional suture anchor and suspension of the internal brace.  Thumb was held in gently abducted position for internal brace placement.  Biplanar fluoroscopy was utilized to confirm appropriate thumb metacarpal positioning, no significant subsidence was noted with gentle stress testing.   At this stage in the procedure, the FCR tendon was identified and the depth of the wound.  The APL tendons were identified at their distal extent overlying the base the metacarpal.  An 0 FiberWire was utilized to create a tendon transfer between the APL tendon slips and the FCR with traction on the thumb.  Excellent stability was noted.  The void created by the  trapeziectomy was filled with a Gelfoam.  The capsular flaps were repaired utilizing 3-0 Vicryl suture in figure-of-eight fashion.  The tourniquet was deflated at 34 minutes.  Fingertips were pink with brisk capillary refill after deflation of tourniquet.  Copious irrigation was performed of both incisional sites.  CMC incision was closed utilizing combination of 3-0 and 4-0 Monocryl.  Carpal tunnel incision was closed utilizing 4-0 nylon.  Sterile dressings were applied followed by application of a thumb spica splint utilizing plaster.  Sling was applied.  The operative drapes were broken down.  The patient was awoken from anesthesia safely and taken to PACU in stable condition.   Post-operative plan: The patient will recover in the post-anesthesia care unit and then be discharged home.  The patient will be non weight bearing on the right upper extremity in a thumb spica splint.   I will see the patient back in the office in 2 weeks for postoperative followup.  Discharge instructions were provided for appropriate wound care, dressing maintenance and pain control.  Keena Dinse, MD Electronically signed, 06/23/24

## 2024-06-23 NOTE — Anesthesia Procedure Notes (Signed)
 Anesthesia Regional Block: Supraclavicular block   Pre-Anesthetic Checklist: , timeout performed,  Correct Patient, Correct Site, Correct Laterality,  Correct Procedure, Correct Position, site marked,  Risks and benefits discussed,  Surgical consent,  Pre-op evaluation,  At surgeon's request and post-op pain management  Laterality: Right and Upper  Prep: chloraprep       Needles:  Injection technique: Single-shot  Needle Type: Echogenic Needle     Needle Length: 9cm  Needle Gauge: 21     Additional Needles:   Procedures:,,,, ultrasound used (permanent image in chart),,    Narrative:  Start time: 06/23/2024 6:59 AM End time: 06/23/2024 7:07 AM Injection made incrementally with aspirations every 5 mL.  Performed by: Personally  Anesthesiologist: Leonce Athens, MD  Additional Notes: Pt identified in Holding room.  Monitors applied. Working IV access confirmed. Timeout, Sterile prep R clavicle and neck.  #21ga ECHOgenic Arrow block needle to supraclavicular brachial plexus with US  guidance.  25cc 0.5% Bupivacaine  1:200k epi injected incrementally after negative test dose.  Patient asymptomatic, VSS, no heme aspirated, tolerated well.   JAYSON Leonce, MD

## 2024-06-23 NOTE — Transfer of Care (Signed)
 Immediate Anesthesia Transfer of Care Note  Patient: Rhonda [name]  Procedure(s) Performed: ARTHROPLASTY, FINGER (Right: Finger) CARPAL TUNNEL RELEASE (Right: Wrist)  Patient Location: PACU  Anesthesia Type:MAC and Regional  Level of Consciousness: awake, alert , and patient cooperative  Airway & Oxygen Therapy: Patient Spontanous Breathing and Patient connected to face mask oxygen  Post-op Assessment: Report given to RN and Post -op Vital signs reviewed and stable  Post vital signs: Reviewed and stable  Last Vitals:  Vitals Value Taken Time  BP 131/80 06/23/24 08:44  Temp 36.3 C 06/23/24 08:44  Pulse 77 06/23/24 08:45  Resp 13 06/23/24 08:45  SpO2 92 % 06/23/24 08:45  Vitals shown include unfiled device data.  Last Pain:  Vitals:   06/23/24 0626  TempSrc: Tympanic  PainSc: 4       Patients Stated Pain Goal: 7 (06/23/24 9373)  Complications: No notable events documented.

## 2024-06-23 NOTE — Anesthesia Postprocedure Evaluation (Signed)
 Anesthesia Post Note  Patient: Heron Boos  Procedure(s) Performed: ARTHROPLASTY, FINGER (Right: Finger) CARPAL TUNNEL RELEASE (Right: Wrist)     Patient location during evaluation: PACU Anesthesia Type: Regional and MAC Level of consciousness: awake and alert, patient cooperative and oriented Pain management: pain level controlled Vital Signs Assessment: post-procedure vital signs reviewed and stable Respiratory status: spontaneous breathing, nonlabored ventilation and respiratory function stable Cardiovascular status: blood pressure returned to baseline and stable Postop Assessment: no apparent nausea or vomiting and able to ambulate Anesthetic complications: no   No notable events documented.  Last Vitals:  Vitals:   06/23/24 0900 06/23/24 0915  BP: (!) 131/91 110/82  Pulse: 68 64  Resp: 12 14  Temp:  (!) 36.3 C  SpO2: 93% 98%    Last Pain:  Vitals:   06/23/24 0915  TempSrc:   PainSc: 0-No pain                 Myca Perno,E. Rhesa Forsberg

## 2024-06-26 ENCOUNTER — Encounter (HOSPITAL_BASED_OUTPATIENT_CLINIC_OR_DEPARTMENT_OTHER): Payer: Self-pay | Admitting: Orthopedic Surgery

## 2024-06-26 NOTE — Telephone Encounter (Signed)
 Review of chart to determine if surgeon advised on restart of Eliquis . Pt tolerated surgery well per surgeon. Pt was advised by surgeon to restart Eliquis  1 day postop.

## 2024-06-29 ENCOUNTER — Telehealth: Payer: Self-pay | Admitting: Orthopedic Surgery

## 2024-06-29 NOTE — Telephone Encounter (Signed)
 Pt called saying that the pain medication take she was prescribed which she is also alternating Tylenol  with isn't working. She also wants to know if she has to keep wearing the sling or remove it, especially at night. Call back number is [phone]

## 2024-06-29 NOTE — Telephone Encounter (Signed)
 Advised pt to take 2 oxycodone  at a time if needed

## 2024-07-04 ENCOUNTER — Other Ambulatory Visit: Payer: Self-pay | Admitting: Orthopedic Surgery

## 2024-07-04 ENCOUNTER — Encounter: Payer: Self-pay | Admitting: Orthopedic Surgery

## 2024-07-04 MED ORDER — OXYCODONE HCL 5 MG PO TABS: 5.0000 mg | ORAL_TABLET | Freq: Four times a day (QID) | ORAL | 0 refills | Status: DC | PRN

## 2024-07-05 NOTE — Progress Notes (Unsigned)
   Rhonda [name] - 53 y.o. female MRN [id number]  Date of birth: May 24, 1971  Office Visit Note: Visit Date: 07/06/2024 PCP: Theophilus Andrews, [name], MD Referred by: Theophilus Andrews, Estel*  Subjective:  HPI: Rhonda [name] is a 53 y.o. female who presents today for follow up 2 weeks status post right thumb carpometacarpal arthroplasty with tendon transfer flexor carpi radialis to abductor pollicis longus. Right partial trapezoidectomy. Right wrist first extensor compartment release. Right open carpal tunnel release.  Pertinent ROS were reviewed with the patient and found to be negative unless otherwise specified above in HPI.   Assessment & Plan: Visit Diagnoses: No diagnosis found.  Plan: ***  Follow-up: No follow-ups on file.   Meds & Orders: No orders of the defined types were placed in this encounter.  No orders of the defined types were placed in this encounter.    Procedures: No procedures performed       Objective:   Vital Signs: There were no vitals taken for this visit.  Ortho Exam ***  Imaging: No results found.   Rhonda [name] Afton Alderton, M.D. [facility], Hand Surgery

## 2024-07-06 ENCOUNTER — Ambulatory Visit (INDEPENDENT_AMBULATORY_CARE_PROVIDER_SITE_OTHER): Payer: PRIVATE HEALTH INSURANCE | Admitting: Orthopedic Surgery

## 2024-07-06 ENCOUNTER — Other Ambulatory Visit: Payer: Self-pay

## 2024-07-06 ENCOUNTER — Ambulatory Visit: Payer: Self-pay

## 2024-07-06 ENCOUNTER — Encounter: Payer: Self-pay | Admitting: Occupational Therapy

## 2024-07-06 ENCOUNTER — Other Ambulatory Visit: Payer: Self-pay | Admitting: Internal Medicine

## 2024-07-06 ENCOUNTER — Ambulatory Visit: Payer: PRIVATE HEALTH INSURANCE | Attending: Orthopedic Surgery | Admitting: Occupational Therapy

## 2024-07-06 DIAGNOSIS — M6281 Muscle weakness (generalized): Secondary | ICD-10-CM | POA: Insufficient documentation

## 2024-07-06 DIAGNOSIS — L905 Scar conditions and fibrosis of skin: Secondary | ICD-10-CM | POA: Insufficient documentation

## 2024-07-06 DIAGNOSIS — R278 Other lack of coordination: Secondary | ICD-10-CM | POA: Insufficient documentation

## 2024-07-06 DIAGNOSIS — M25531 Pain in right wrist: Secondary | ICD-10-CM | POA: Insufficient documentation

## 2024-07-06 DIAGNOSIS — M1811 Unilateral primary osteoarthritis of first carpometacarpal joint, right hand: Secondary | ICD-10-CM

## 2024-07-06 DIAGNOSIS — G5601 Carpal tunnel syndrome, right upper limb: Secondary | ICD-10-CM | POA: Insufficient documentation

## 2024-07-06 DIAGNOSIS — R600 Localized edema: Secondary | ICD-10-CM | POA: Insufficient documentation

## 2024-07-06 DIAGNOSIS — M25541 Pain in joints of right hand: Secondary | ICD-10-CM | POA: Insufficient documentation

## 2024-07-06 DIAGNOSIS — R29898 Other symptoms and signs involving the musculoskeletal system: Secondary | ICD-10-CM | POA: Insufficient documentation

## 2024-07-06 NOTE — Therapy (Signed)
 OUTPATIENT OCCUPATIONAL THERAPY ORTHO EVALUATION  Patient Name: Rhonda [name] MRN: [id number] DOB:12-Jan-1971, 53 y.o., female Today's Date: 07/06/2024  PCP: Theophilus Delma Krabbe, MD  REFERRING PROVIDER: Arlinda Buster, MD  END OF SESSION:  OT End of Session - 07/06/24 1022     Visit Number 2    Number of Visits 21    Date for Recertification  09/22/24    Authorization Type BCBS    OT Start Time 1021    OT Stop Time 1142    OT Time Calculation (min) 81 min    Activity Tolerance Patient tolerated treatment well;Patient limited by pain    Behavior During Therapy WFL for tasks assessed/performed         Past Medical History:  Diagnosis Date   Allergy    Anxiety    C1q nephropathy    Cancer (HCC)    history of thyroid  cancer   Chronic constipation    CKD (chronic kidney disease), stage II nephrologist-  dr dominique pipe [facility])   secondary to C1q nephropathy/  hypertension   Family history of adverse reaction to anesthesia    father hard to put down for anesthesia   GERD (gastroesophageal reflux disease)    H/O radioactive iodine thyroid  ablation    1992  right throid   History of exercise intolerance    01-052011  ETT--  normal w/ no ischemia per cardiologist (dr rolan) epic note   History of pulmonary embolus (PE)    07/ 2008-- treated w/ coumadin   History of syncope    recurrent --  workup done per epic -- dx orthostatic hypotension   Hyperlipidemia    Hypertension    IBS (irritable bowel syndrome)    Neuromuscular disorder (HCC)    carpal tunnel   Pelvic pain in female    Post-surgical hypothyroidism    1991 left thyroidectomy   Pre-diabetes    Proteinuria    Seasonal and perennial allergic rhinitis    Wears contact lenses    Wears glasses    Past Surgical History:  Procedure Laterality Date   59 HOUR PH STUDY N/A 11/16/2016   Procedure: 24 HOUR PH STUDY;  Surgeon: Victory LITTIE Legrand DOUGLAS, MD;  Location: [facility];  Service: Gastroenterology;  Laterality: N/A;   ABDOMINAL HYSTERECTOMY  1998   CARPAL TUNNEL RELEASE Right 06/23/2024   Procedure: CARPAL TUNNEL RELEASE;  Surgeon: Arlinda Buster, MD;  Location: [facility];  Service: Orthopedics;  Laterality: Right;   COLONOSCOPY     COLONOSCOPY WITH PROPOFOL  N/A 08/05/2021   Procedure: COLONOSCOPY WITH PROPOFOL ;  Surgeon: Federico Rosario BROCKS, MD;  Location: [facility];  Service: Gastroenterology;  Laterality: N/A;   CYSTOSCOPY N/A 04/28/2016   Procedure: CYSTOSCOPY;  Surgeon: Norleen Skill, MD;  Location: [facility];  Service: Gynecology;  Laterality: N/A;   ESOPHAGEAL MANOMETRY N/A 11/16/2016   Procedure: ESOPHAGEAL MANOMETRY (EM);  Surgeon: Victory LITTIE Legrand DOUGLAS, MD;  Location: [facility];  Service: Gastroenterology;  Laterality: N/A;   ESOPHAGOGASTRODUODENOSCOPY     FINGER ARTHROPLASTY Right 06/23/2024   Procedure: ARTHROPLASTY, FINGER;  Surgeon: Arlinda Buster, MD;  Location: [facility];  Service: Orthopedics;  Laterality: Right;  RIGHT THUMB CARPOMATACARPAL ARTHROPLASTY WITH INTERNAL BRACE/ RIGHT OPEN CARPAL TUNNEL RELEASE   LAPAROSCOPIC CHOLECYSTECTOMY  1997   LAPAROSCOPY N/A 03/26/2016   Procedure: LAPAROSCOPY DIAGNOSTIC, LYSIS OF ADHESIONS;  Surgeon: Norleen Skill, MD;  Location: [facility];  Service: Gynecology;  Laterality: N/A;   LAPAROTOMY N/A  04/28/2016   Procedure: EXPLORATORY LAPAROTOMY;  Surgeon: Norleen Skill, MD;  Location: [facility];  Service: Gynecology;  Laterality: N/A;   RENAL BIOPSY     SALPINGOOPHORECTOMY Bilateral 04/28/2016   Procedure: Bilateral OOPHORECTOMY;  Surgeon: Norleen Skill, MD;  Location: [facility];  Service: Gynecology;  Laterality: Bilateral;   THYROIDECTOMY Left 1991   TRANSTHORACIC ECHOCARDIOGRAM  04/27/2012   normal LV, ef 60-65%/  trivial MR   TUBAL LIGATION  1992   Patient Active Problem List   Diagnosis Date Noted   Arthritis of carpometacarpal [facility]) joint of right thumb  06/23/2024   Chronic pain of right wrist 08/16/2023   Hypomagnesemia 10/06/2021   Hypercalcemia 10/06/2021   Colon cancer screening    Carpal tunnel syndrome, right upper limb 01/07/2021   Flexor carpi radialis tendinitis 01/07/2021   Vitamin D  deficiency 05/19/2019   IGT (impaired glucose tolerance) 05/19/2019   Exposure to medical diagnostic radiation 02/09/2017   S/P bilateral salpingo-oophorectomy 04/28/2016   Pelvic adhesions 03/26/2016    Class: Present on Admission   History of pulmonary embolism 08/01/2014   CKD (chronic kidney disease) stage 3, GFR 30-59 ml/min (HCC) 02/27/2014   Hypothyroidism 04/28/2012   Hemorrhage of rectum and anus 02/09/2011   Constipation 02/09/2011   Dyslipidemia 08/26/2009   Backache 02/14/2008   Allergic rhinitis 11/15/2007   GERD 06/07/2007   Anxiety state 04/27/2007   Essential hypertension 04/27/2007   Disorder of kidney and ureter 03/17/2007   EMBOLISM/THROMBOSIS, DEEP VSL PRXML LWR EXTRM 03/16/2007    ONSET DATE: 05/25/2024 (Date of referral); DOS: 06/23/2024  REFERRING DIAG:  M18.11 (ICD-10-CM) - Arthritis of carpometacarpal (CMC) joint of right thumb  G56.01 (ICD-10-CM) - Carpal tunnel syndrome, right upper limb   THERAPY DIAG:  Muscle weakness (generalized)  Other lack of coordination  Pain in joint of right hand  Pain in right wrist  Rationale for Evaluation and Treatment: Rehabilitation  SUBJECTIVE:   SUBJECTIVE STATEMENT: 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 Significant difficulty sleeping due to pain.    PERTINENT HISTORY: Pt was seen in 2024 and  2025 by OP OT to address Rt hand/wrist/thumb pain the underwent right thumb carpometacarpal arthroplasty with tendon transfer flexor carpi radialis to abductor pollicis longus. Right partial trapezoidectomy. Right wrist first extensor compartment release. Right open carpal tunnel release.  on 06/23/2024 after conservative measures were unsuccessful.    PRECAUTIONS: [place]  Hand Protocol volume 1 (pages 35-38) [place]  Hand Protocol treatment volume 3 (pages [phone])  As per [place]  Hand Protocol for post op Right [facility] arthroplasty and pertinent history/MD notes above.  WEIGHT BEARING RESTRICTIONS:Yes , NWB, No functional use right hand/wrist/thumb  PAIN:  Are you having pain? Yes: NPRS scale: 7/10 in Rt wrist Pain location: Rt palm, areas of incision  Pain description: prickly and burning spasms Aggravating factors: sleeping on back Relieving factors: Rest and heat  Worst pain in last 24 hours: 8/10   FALLS: Has patient fallen in last 6 months? Yes. Number of falls 1 - 8/21 while assisting a pt, pt had muscle spasms in back as a result  PLOF: Independent  PATIENT GOALS: To improve use of RUE  NEXT MD VISIT: 07/31/2024 at 2:45 with Dr. Erwin  OBJECTIVE: (All objective assessments below are from initial evaluation on: 07/06/2024 unless otherwise specified.)   HAND DOMINANCE: Right   ADLs: Overall ADLs: mostly mod I though requires occasional assistance from husband for fasteners.   FUNCTIONAL OUTCOME MEASURES: QuickDASH: 88.6 % with use  of RUE   UPPER EXTREMITY ROM:     AROM Right (eval) Left (eval)  Shoulder flexion WNL WNL  Shoulder abduction  WNL  Elbow flexion WFL WNL  Elbow extension WFL with increased pain WNL  Wrist flexion  WNL  Wrist extension  WNL  Wrist pronation  WNL  Wrist supination  WNL   Digit Composite Flexion  WNL  Digit Composite Extension  WNL  Digit Opposition  WNL  (Blank rows = not tested)  HAND FUNCTION: Grip strength: Right: Deferred lbs; Left: NT lbs  Lateral pinch: Right: Deferred lbs, Left: NT lbs  3 point pinch: Right: Deferred lbs, Left: NT lbs   COORDINATION: 9 Hole Peg test: Right: Deferred sec; Left: NT sec  SENSATION: Eval:  severe paresthesias reported through RUE  EDEMA:   Eval: mild to moderate edema appreciated  COGNITION: Eval: Overall cognitive status: WFL  for evaluation today   OBSERVATIONS:   Pt ambulates without use of AD. No loss of balance. The pt appears well kept and has arm sling donned to RUE. Incisions scabbed over following suture removal. Pt holds RUE in guarded positioning including palm.  TODAY'S TREATMENT:   Pt was fitted with a right custom fabricated thumb spica splint placing thumb in slight ABD and rotation, or the functional position following a right thumb CMC arthroplasty on 11/7. Pt was educated to wear the splint at all times for protection as well as splinting use, care, and precautions. The pt was educated in the HEP noted in pt instructions as per [place]  Hand Protocol following right [facility] arthroplasty. Verbal instruction, demonstration/performance in the clinic as well as, written/handouts were provided. Patient verbalized understanding in the clinic today.   Additional education provided with respect to edema and pain management as noted in pt instructions and verbal instructions for scar massage to outside areas of incision until fully healed.   PATIENT EDUCATION: Education details: See tx section above for details  Person educated: Patient Education method: Verbal Instruction, Teach back, Handouts  Education comprehension: States and demonstrates understanding, Additional Education required   HOME EXERCISE PROGRAM: 07/06/2024: R digit and wrist ROM (excluding thumb); contrast baths; heat  GOALS: SHORT TERM GOALS:  Target Date: 08/07/2024    Pt will obtain protective, custom forearm based thumb spica splint/orthotic. Goal status: MET   2.  Pt will demo/state understanding of initial HEP to improve pain levels and prerequisite motion for functional use of right hand/thumb. Goal status: INITIAL  LONG TERM GOALS: Target Date: 09/22/2024  Patient will demonstrate at least 16% improvement with quick Dash score (reporting 72.6% disability or less) indicating improved functional use of affected extremity.  Baseline:  88.6% disability with use fo RUE.  Goal status: INITIAL   2.  Pt will improve grip strength in right hand from unable to assess to at least 15 lbs for functional use at home and in IADLs. Goal status: INITIAL   3.  Pt will improve A/ROM in right thumb from opposition to tip of index finger on right to at least opposition to PIP (or greater) of right small finger, to have functional motion for tasks like ADLs, reach, and grasp.  Goal status: INITIAL   4.  Pt will improve right hand functional use from unable to assess to at least Mod I for basic ADLs (ie, brushing her hair, bathing, brushing her teeth and folding laundry) to assist in ability to carry out selfcare and higher-level homecare tasks with less difficulty. Goal status: INITIAL  5.  Pt will improve coordination skills in right hand, as seen by better score on 9 hole peg testing to have increased functional ability to carry out fine motor tasks (fasteners, etc.) and more complex, coordinated IADLs (meal prep, sports, etc.).  Goal status: INITIAL/TBD   6.  Pt will decrease pain at worst from 7/10 to 2/10 or less to have better sleep and occupational participation in daily roles. Goal status: INITIAL   ASSESSMENT:  CLINICAL IMPRESSION: Patient is a 53 y.o. female who was seen today for occupational therapy evaluation for R [facility] arthroplasty and CTR 06/23/2024. Hx includes thyroid  cancer with left thyroidectomy (1991), anxiety, CKD stage II, hx of PE, orthostatic hypotension, HTN, HLD, and IBS. Patient currently presents below baseline level of functioning demonstrating functional deficits and impairments as noted below. Pt would benefit from skilled OT services in the outpatient setting to work on impairments as noted below to help pt return to PLOF as able. Currently, the pt is 1 week and 6 days post-op. Pt was fitted with a right custom fabricated forearm based thumb spica splint placing thumb in slight ABD and rotation, functional  position following a right thumb CMC arthroplasty on 06/23/2024. Pt was educated to wear the splint at all times for protection as well as splinting use, care, and precautions. Educated in HEP as per [place]  Hand Protocol following right CMC arthroplasty. Verbal/written handout instructions provided and patient verbalized understanding in the clinic today.  Pt will benefit from outpatient OT to address deficits in A/ROM, decreased functional use of right hand/thumb, pain, pt education, scar management/desensitization, splinting, edema control, and overall functional use and strengthening of the affected hand.   PERFORMANCE DEFICITS: in functional skills including ADLs, IADLs, coordination, ROM, strength, pain, fascial restrictions, muscle spasms, flexibility, Gross motor control, body mechanics, endurance, decreased knowledge of precautions, and UE functional use, cognitive skills including problem solving, and psychosocial skills including coping strategies, environmental adaptation, and habits.   IMPAIRMENTS: are limiting patient from ADLs, IADLs, rest and sleep, work, and leisure.   COMORBIDITIES: may have co-morbidities  that affects occupational performance. Patient will benefit from skilled OT to address above impairments and improve overall function.  MODIFICATION OR ASSISTANCE TO COMPLETE EVALUATION: Min-Moderate modification of tasks or assist with assess necessary to complete an evaluation.  OT OCCUPATIONAL PROFILE AND HISTORY: Detailed assessment: Review of records and additional review of physical, cognitive, psychosocial history related to current functional performance.  CLINICAL DECISION MAKING: Moderate - several treatment options, min-mod task modification necessary  REHAB POTENTIAL: Excellent  EVALUATION COMPLEXITY: Moderate   PLAN:  OT FREQUENCY: 1-2x/week  OT DURATION: up to 10 weeks   PLANNED INTERVENTIONS: 97168 OT Re-evaluation, 97535 self care/ADL training, 02889  therapeutic exercise, 97530 therapeutic activity, 97112 neuromuscular re-education, 97140 manual therapy, 97035 ultrasound, 97018 paraffin, 02960 fluidotherapy, 97010 moist heat, 97010 cryotherapy, 97032 electrical stimulation (manual), 97750 Physical Performance Testing, 02239 Orthotic Initial, [id number] Prosthetic Initial, [id number] Orthotic/Prosthetic subsequent, scar mobilization, passive range of motion, compression bandaging, Dry needling, energy conservation, coping strategies training, patient/family education, and DME and/or AE instructions  RECOMMENDED OTHER SERVICES: None now  CONSULTED AND AGREED WITH PLAN OF CARE: Patient  PLAN FOR NEXT SESSION:   Sleep positioning, scar care, review HEP and progress as needed  Jocelyn CHRISTELLA Bottom, OTR/L 07/06/2024, 1:56 PM

## 2024-07-06 NOTE — Patient Instructions (Addendum)
 CONTRAST BATHS  Definition-a form of alternating heat and cold therapy used to relieve swelling in the hand/arm or swelling within or around nerves & tendons.  It is used before, during, or after exercises or separately throughout the day.  It is important that you follow the directions specifically.  Instructions-Find a reliable way to apply heat (heating pad, rice sock, etc.)  And also way to apply cold (ice and freezer bag, cold pack, ball with ice water , etc.)  And set them up side-by-side on a table.  You will also need a timer or stopwatch.  Begin by placing your hand in the heat for 3 minutes.  After 3 minutes in the heat, remove your hand and transfer into the cold for 1 minute.  Repeat this process for 20 minutes (5 cycles of 3 min heat/1 min cold)  This can be done 1-3 times per day to help reduce swelling, pain, and promote movement in the wrist/fingers.   Heat Home Program Heat is used as part of your therapy for several reasons.  Heat increases blood flow, which promotes healing. Heat also relaxes muscles and joints which makes exercising easier.  Complete BEFORE exercise to help manage pain and increase stretch.  It can be applied for __10-15_ minutes,  _up to 3_ sessions per day  Use one of the following methods that is most convenient for you  Heating pad: Follow instructions given by manufacturer.  Use on low-medium setting.    Moist heated towel: Soak towel in hot water  or place damp towel in microwave and heat for 30 sec. Check temperature to determine if further time needed.  Wring out towel and wrap around affected area, cover with a plastic bag.  Can also wrap a dry towel around the plastic to help retain the heat.    Microwave gel pack: Follow instructions given by manufacturer.  Check temperature before application to ensure not hot enough to cause a burn    Rice sock: This can be made using a sock with no synthetic material and 1.5 cups of rice.  Pour the  rice into the sock, tie a knot at the top.  Place in microwave for 1 minute, remove to check temperature to determine if further heating time is required prior to application  Be sure to check the skin periodically to ensure no excessive redness or blisters.  Use with caution in areas with decreased sensation

## 2024-07-11 ENCOUNTER — Ambulatory Visit: Payer: PRIVATE HEALTH INSURANCE | Admitting: Occupational Therapy

## 2024-07-11 DIAGNOSIS — M25541 Pain in joints of right hand: Secondary | ICD-10-CM

## 2024-07-11 DIAGNOSIS — R278 Other lack of coordination: Secondary | ICD-10-CM

## 2024-07-11 DIAGNOSIS — L905 Scar conditions and fibrosis of skin: Secondary | ICD-10-CM

## 2024-07-11 DIAGNOSIS — M6281 Muscle weakness (generalized): Secondary | ICD-10-CM

## 2024-07-11 DIAGNOSIS — R29898 Other symptoms and signs involving the musculoskeletal system: Secondary | ICD-10-CM

## 2024-07-11 DIAGNOSIS — R600 Localized edema: Secondary | ICD-10-CM

## 2024-07-11 NOTE — Patient Instructions (Signed)
 SABRA

## 2024-07-11 NOTE — Therapy (Addendum)
 OUTPATIENT OCCUPATIONAL THERAPY ORTHO TREATMENT  Patient Name: Rhonda [name] MRN: [id number] DOB:[birth year], 53 y.o., female Today's Date: 07/11/2024  PCP: Theophilus Delma Krabbe, MD  REFERRING PROVIDER: Arlinda Buster, MD  END OF SESSION:  OT End of Session - 07/11/24 0931     Visit Number 2    Number of Visits 21    Date for Recertification  09/22/24    Authorization Type BC[employer]    OT Start Time 0932    OT Stop Time 1015    OT Time Calculation (min) 43 min    Equipment Utilized During Treatment Splint    Activity Tolerance Patient tolerated treatment well;Patient limited by pain    Behavior During Therapy WFL for tasks assessed/performed         Past Medical History:  Diagnosis Date   Allergy    Anxiety    C1q nephropathy    Cancer (HCC)    history of thyroid  cancer   Chronic constipation    CKD (chronic kidney disease), stage II nephrologist-  dr dominique pipe [facility])   secondary to C1q nephropathy/  hypertension   Family history of adverse reaction to anesthesia    father hard to put down for anesthesia   GERD (gastroesophageal reflux disease)    H/O radioactive iodine thyroid  ablation    1992  right throid   History of exercise intolerance    01-052011  ETT--  normal w/ no ischemia per cardiologist (dr rolan) epic note   History of pulmonary embolus (PE)    07/ 2008-- treated w/ coumadin   History of syncope    recurrent --  workup done per epic -- dx orthostatic hypotension   Hyperlipidemia    Hypertension    IBS (irritable bowel syndrome)    Neuromuscular disorder (HCC)    carpal tunnel   Pelvic pain in female    Post-surgical hypothyroidism    1991 left thyroidectomy   Pre-diabetes    Proteinuria    Seasonal and perennial allergic rhinitis    Wears contact lenses    Wears glasses    Past Surgical History:  Procedure Laterality Date   45 HOUR PH STUDY N/A 11/16/2016   Procedure: 24 HOUR PH STUDY;  Surgeon:  Victory LITTIE Legrand DOUGLAS, MD;  Location: [facility];  Service: Gastroenterology;  Laterality: N/A;   ABDOMINAL HYSTERECTOMY  1998   CARPAL TUNNEL RELEASE Right 06/23/2024   Procedure: CARPAL TUNNEL RELEASE;  Surgeon: Arlinda Buster, MD;  Location: [facility];  Service: Orthopedics;  Laterality: Right;   COLONOSCOPY     COLONOSCOPY WITH PROPOFOL  N/A 08/05/2021   Procedure: COLONOSCOPY WITH PROPOFOL ;  Surgeon: Federico Rosario BROCKS, MD;  Location: [facility];  Service: Gastroenterology;  Laterality: N/A;   CYSTOSCOPY N/A 04/28/2016   Procedure: CYSTOSCOPY;  Surgeon: Norleen Skill, MD;  Location: [facility];  Service: Gynecology;  Laterality: N/A;   ESOPHAGEAL MANOMETRY N/A 11/16/2016   Procedure: ESOPHAGEAL MANOMETRY (EM);  Surgeon: Victory LITTIE Legrand DOUGLAS, MD;  Location: [facility];  Service: Gastroenterology;  Laterality: N/A;   ESOPHAGOGASTRODUODENOSCOPY     FINGER ARTHROPLASTY Right 06/23/2024   Procedure: ARTHROPLASTY, FINGER;  Surgeon: Arlinda Buster, MD;  Location: [facility];  Service: Orthopedics;  Laterality: Right;  RIGHT THUMB CARPOMATACARPAL ARTHROPLASTY WITH INTERNAL BRACE/ RIGHT OPEN CARPAL TUNNEL RELEASE   LAPAROSCOPIC CHOLECYSTECTOMY  1997   LAPAROSCOPY N/A 03/26/2016   Procedure: LAPAROSCOPY DIAGNOSTIC, LYSIS OF ADHESIONS;  Surgeon: Norleen Skill, MD;  Location: [facility];  Service:  Gynecology;  Laterality: N/A;   LAPAROTOMY N/A 04/28/2016   Procedure: EXPLORATORY LAPAROTOMY;  Surgeon: Norleen Skill, MD;  Location: [facility];  Service: Gynecology;  Laterality: N/A;   RENAL BIOPSY     SALPINGOOPHORECTOMY Bilateral 04/28/2016   Procedure: Bilateral OOPHORECTOMY;  Surgeon: Norleen Skill, MD;  Location: [facility];  Service: Gynecology;  Laterality: Bilateral;   THYROIDECTOMY Left 1991   TRANSTHORACIC ECHOCARDIOGRAM  04/27/2012   normal LV, ef 60-65%/  trivial MR   TUBAL LIGATION  1992   Patient Active Problem List   Diagnosis Date Noted   Arthritis of  carpometacarpal [facility]) joint of right thumb 06/23/2024   Chronic pain of right wrist 08/16/2023   Hypomagnesemia 10/06/2021   Hypercalcemia 10/06/2021   Colon cancer screening    Carpal tunnel syndrome, right upper limb 01/07/2021   Flexor carpi radialis tendinitis 01/07/2021   Vitamin D  deficiency 05/19/2019   IGT (impaired glucose tolerance) 05/19/2019   Exposure to medical diagnostic radiation 02/09/2017   S/P bilateral salpingo-oophorectomy 04/28/2016   Pelvic adhesions 03/26/2016    Class: Present on Admission   History of pulmonary embolism 08/01/2014   CKD (chronic kidney disease) stage 3, GFR 30-59 ml/min (HCC) 02/27/2014   Hypothyroidism 04/28/2012   Hemorrhage of rectum and anus 02/09/2011   Constipation 02/09/2011   Dyslipidemia 08/26/2009   Backache 02/14/2008   Allergic rhinitis 11/15/2007   GERD 06/07/2007   Anxiety state 04/27/2007   Essential hypertension 04/27/2007   Disorder of kidney and ureter 03/17/2007   EMBOLISM/THROMBOSIS, DEEP VSL PRXML LWR EXTRM 03/16/2007    ONSET DATE: 05/25/2024 (Date of referral); DOS: 06/23/2024  REFERRING DIAG:  M18.11 (ICD-10-CM) - Arthritis of carpometacarpal (CMC) joint of right thumb  G56.01 (ICD-10-CM) - Carpal tunnel syndrome, right upper limb   THERAPY DIAG:  Pain in joint of right hand  Muscle weakness (generalized)  Other lack of coordination  Localized edema  Scar condition and fibrosis of skin  Other symptoms and signs involving the musculoskeletal system  Rationale for Evaluation and Treatment: Rehabilitation  SUBJECTIVE:   SUBJECTIVE STATEMENT:  Pt reported she took pain pill (Oxy) at 8 AM pain at that time was 7-8/10 and down to 5-6 after meds.  Pt accompanied by: Self  PERTINENT HISTORY: Pt was seen in 2024 and  2025 by OP OT to address Rt hand/wrist/thumb pain the underwent right thumb carpometacarpal arthroplasty with tendon transfer flexor carpi radialis to abductor pollicis longus. Right partial  trapezoidectomy. Right wrist first extensor compartment release. Right open carpal tunnel release.  on 06/23/2024 after conservative measures were unsuccessful.   PRECAUTIONS: [place]  Hand Protocol volume 1 (pages 35-38) [place]  Hand Protocol treatment volume 3 (pages [phone])  As per [place]  Hand Protocol for post op Right [facility] arthroplasty and pertinent history/MD notes above.  WEIGHT BEARING RESTRICTIONS:Yes , NWB, No functional use right hand/wrist/thumb  PAIN:  Are you having pain? Yes: NPRS scale: 7-8/10 in Rt wrist Pain location: Rt palm, areas of incision  Pain description: numbin, burning tingling; spasms are more painful Aggravating factors: sleeping on back Relieving factors: Rest and heat  Worst pain in last 24 hours: 8/10   FALLS: Has patient fallen in last 6 months? Yes. Number of falls 1 - 8/21 while assisting a pt, pt had muscle spasms in back as a result  PLOF: Independent  PATIENT GOALS: To improve use of RUE  NEXT MD VISIT: 07/31/2024 at 2:45 with Dr. Erwin  OBJECTIVE: (All objective assessments below are from initial evaluation on: 07/06/2024 unless otherwise  specified.)   HAND DOMINANCE: Right   ADLs: Overall ADLs: mostly mod I though requires occasional assistance from husband for fasteners.   FUNCTIONAL OUTCOME MEASURES: QuickDASH: 88.6 % with use of RUE   UPPER EXTREMITY ROM:     AROM Right (eval) Left (eval)  Shoulder flexion WNL WNL  Shoulder abduction  WNL  Elbow flexion WFL WNL  Elbow extension WFL with increased pain WNL  Wrist flexion  WNL  Wrist extension  WNL  Wrist pronation  WNL  Wrist supination  WNL   Digit Composite Flexion  WNL  Digit Composite Extension  WNL  Digit Opposition  WNL  (Blank rows = not tested)  HAND FUNCTION: Grip strength: Right: Deferred lbs; Left: NT lbs  Lateral pinch: Right: Deferred lbs, Left: NT lbs  3 point pinch: Right: Deferred lbs, Left: NT lbs   COORDINATION: 9 Hole Peg test: Right: Deferred  sec; Left: NT sec  SENSATION: Eval:  severe paresthesias reported through RUE  EDEMA:   Eval: mild to moderate edema appreciated  COGNITION: Eval: Overall cognitive status: WFL for evaluation today   OBSERVATIONS:   Pt ambulates without use of AD. No loss of balance. The pt appears well kept and has arm sling donned to RUE. Incisions scabbed over following suture removal. Pt holds RUE in guarded positioning including palm.  TODAY'S TREATMENT:   - Orthotic Modifications completed for duration as noted below including:  Pt had modifications made to right custom fabricated thumb spica splint placing thumb in slight ABD and rotation, or the functional position following a right thumb CMC arthroplasty to improve comfort, fit and wrist position ie) wrist more neutral, to decrease pressure along edges of splint and improve thumb IP motion. Pt was educated to wear the splint at all times for protection when moving but can remove for ROM.  - Therapeutic exercises completed for duration as noted below including:  Reviewed ROM exercises including those provided last week and additional tendon gliding exercises issued today with handout to educate in motions to isolate DIP, PIP and MCP joints for straight finger position, hook (DIP/PIP flexion), fist (DIP/PIP/MCP flexion), taco/duck (MCP flexion only) and flat fist (MCP and PIP flexion). Education provided on purpose of tendon glide exercises ie) to increase the circulation to the hand and wrist, reduce swelling and promote healthier soft tissue for increased AROM and not to build hand or wrist strength at this time.  - Self Care education and training completed for duration as noted below including:  Education provided in retrograde AND scar massage/desensitization to decrease edema of hand digits and thumb for increased tissue extensibility and therefore increased ROM.  Pt shown how to perform massage task prior ROM activities using a light, gentle  pressure in a downward direction, as excessive pressure can compress lymphatic vessels and hinder the effectiveness of the massage. Pt encouraged to touch and move scars as she was quite hypersensitive to touch along both incisions.   PATIENT EDUCATION: Education details: Tendon glides, edema and scar massage  Person educated: Patient Education method: Engineer, Structural, Teach back, Handouts  Education comprehension: States and demonstrates understanding, Additional Education required   HOME EXERCISE PROGRAM: 07/06/2024: R digit and wrist ROM (excluding thumb); contrast baths; heat  GOALS: SHORT TERM GOALS:  Target Date: 08/07/2024    Pt will obtain protective, custom forearm based thumb spica splint/orthotic. Goal status: In progress 07/11/24: Modifications made.  Will need hand based splint in future   2.  Pt will demo/state understanding of  initial HEP to improve pain levels and prerequisite motion for functional use of right hand/thumb. Goal status: IN Progress  LONG TERM GOALS: Target Date: 09/22/2024  Patient will demonstrate at least 16% improvement with quick Dash score (reporting 72.6% disability or less) indicating improved functional use of affected extremity.  Baseline: 88.6% disability with use fo RUE.  Goal status: INITIAL   2.  Pt will improve grip strength in right hand from unable to assess to at least 15 lbs for functional use at home and in IADLs. Goal status: INITIAL   3.  Pt will improve A/ROM in right thumb from opposition to tip of index finger on right to at least opposition to PIP (or greater) of right small finger, to have functional motion for tasks like ADLs, reach, and grasp.  Goal status: INITIAL   4.  Pt will improve right hand functional use from unable to assess to at least Mod I for basic ADLs (ie, brushing her hair, bathing, brushing her teeth and folding laundry) to assist in ability to carry out selfcare and higher-level homecare tasks with less  difficulty. Goal status: INITIAL   5.  Pt will improve coordination skills in right hand, as seen by better score on 9 hole peg testing to have increased functional ability to carry out fine motor tasks (fasteners, etc.) and more complex, coordinated IADLs (meal prep, sports, etc.).  Goal status: INITIAL/TBD   6.  Pt will decrease pain at worst from 7/10 to 2/10 or less to have better sleep and occupational participation in daily roles. Goal status: IN Progress   ASSESSMENT:  CLINICAL IMPRESSION: Patient is a 53 y.o. female who was seen today for occupational therapy treatment for [facility] arthroplasty and CTR 06/23/2024. Modifications to splint made to increased alignment of wrist and comfort of splint.  Progression of finger ROM with thumb IP flexion in splint reviewed.  Pt will benefit from further outpatient OT to address deficits in A/ROM, decreased functional use of right hand/thumb, pain, pt education, scar management/desensitization, splinting, edema control, and overall functional use and strengthening of the affected hand.   PERFORMANCE DEFICITS: in functional skills including ADLs, IADLs, coordination, ROM, strength, pain, fascial restrictions, muscle spasms, flexibility, Gross motor control, body mechanics, endurance, decreased knowledge of precautions, and UE functional use, cognitive skills including problem solving, and psychosocial skills including coping strategies, environmental adaptation, and habits.   IMPAIRMENTS: are limiting patient from ADLs, IADLs, rest and sleep, work, and leisure.   COMORBIDITIES: may have co-morbidities  that affects occupational performance. Patient will benefit from skilled OT to address above impairments and improve overall function.  REHAB POTENTIAL: Excellent  PLAN:  OT FREQUENCY: 1-2x/week  OT DURATION: up to 10 weeks   PLANNED INTERVENTIONS: 97168 OT Re-evaluation, 97535 self care/ADL training, 02889 therapeutic exercise, 97530 therapeutic  activity, 97112 neuromuscular re-education, 97140 manual therapy, 97035 ultrasound, 97018 paraffin, 02960 fluidotherapy, 97010 moist heat, 97010 cryotherapy, 97032 electrical stimulation (manual), 97750 Physical Performance Testing, 02239 Orthotic Initial, [id number] Prosthetic Initial, [id number] Orthotic/Prosthetic subsequent, scar mobilization, passive range of motion, compression bandaging, Dry needling, energy conservation, coping strategies training, patient/family education, and DME and/or AE instructions  RECOMMENDED OTHER SERVICES: None now  CONSULTED AND AGREED WITH PLAN OF CARE: Patient  PLAN FOR NEXT SESSION:   Scar massage/desensitization  Sleep positioning, Review HEP and progress as needed [place]  Hand Protocol volume 1 (pages 35-38) [place]  Hand Protocol treatment volume 3 (pages [phone])  Clarita LITTIE Pride, OTR/L 07/11/2024, 9:32 AM

## 2024-07-17 ENCOUNTER — Encounter: Payer: Self-pay | Admitting: Orthopedic Surgery

## 2024-07-18 ENCOUNTER — Other Ambulatory Visit: Payer: Self-pay | Admitting: Orthopedic Surgery

## 2024-07-18 MED ORDER — OXYCODONE HCL 5 MG PO TABS: 5.0000 mg | ORAL_TABLET | Freq: Four times a day (QID) | ORAL | 0 refills | Status: AC | PRN

## 2024-07-19 ENCOUNTER — Encounter: Payer: Self-pay | Admitting: Occupational Therapy

## 2024-07-19 ENCOUNTER — Ambulatory Visit: Payer: PRIVATE HEALTH INSURANCE | Admitting: Occupational Therapy

## 2024-07-19 DIAGNOSIS — R29898 Other symptoms and signs involving the musculoskeletal system: Secondary | ICD-10-CM | POA: Diagnosis present

## 2024-07-19 DIAGNOSIS — M25631 Stiffness of right wrist, not elsewhere classified: Secondary | ICD-10-CM | POA: Insufficient documentation

## 2024-07-19 DIAGNOSIS — R278 Other lack of coordination: Secondary | ICD-10-CM | POA: Insufficient documentation

## 2024-07-19 DIAGNOSIS — M25531 Pain in right wrist: Secondary | ICD-10-CM | POA: Insufficient documentation

## 2024-07-19 DIAGNOSIS — M25541 Pain in joints of right hand: Secondary | ICD-10-CM | POA: Diagnosis present

## 2024-07-19 DIAGNOSIS — M6281 Muscle weakness (generalized): Secondary | ICD-10-CM | POA: Diagnosis present

## 2024-07-19 DIAGNOSIS — L905 Scar conditions and fibrosis of skin: Secondary | ICD-10-CM | POA: Diagnosis present

## 2024-07-19 DIAGNOSIS — R600 Localized edema: Secondary | ICD-10-CM | POA: Insufficient documentation

## 2024-07-19 DIAGNOSIS — M25642 Stiffness of left hand, not elsewhere classified: Secondary | ICD-10-CM | POA: Diagnosis present

## 2024-07-19 NOTE — Therapy (Signed)
 OUTPATIENT OCCUPATIONAL THERAPY ORTHO TREATMENT  Patient Name: Rhonda [name] MRN: [id number] DOB:June 13, 1971, 53 y.o., female Today's Date: 07/19/2024  PCP: Theophilus Delma Krabbe, MD  REFERRING PROVIDER: Arlinda Buster, MD  END OF SESSION:  OT End of Session - 07/19/24 1400     Visit Number 3    Number of Visits 21    Date for Recertification  09/22/24    Authorization Type BC[employer]    OT Start Time 1358    OT Stop Time 1452    OT Time Calculation (min) 54 min    Equipment Utilized During Treatment Splint    Activity Tolerance Patient tolerated treatment well;Patient limited by pain    Behavior During Therapy WFL for tasks assessed/performed         Past Medical History:  Diagnosis Date   Allergy    Anxiety    C1q nephropathy    Cancer (HCC)    history of thyroid  cancer   Chronic constipation    CKD (chronic kidney disease), stage II nephrologist-  dr dominique pipe [facility])   secondary to C1q nephropathy/  hypertension   Family history of adverse reaction to anesthesia    father hard to put down for anesthesia   GERD (gastroesophageal reflux disease)    H/O radioactive iodine thyroid  ablation    1992  right throid   History of exercise intolerance    01-052011  ETT--  normal w/ no ischemia per cardiologist (dr rolan) epic note   History of pulmonary embolus (PE)    07/ 2008-- treated w/ coumadin   History of syncope    recurrent --  workup done per epic -- dx orthostatic hypotension   Hyperlipidemia    Hypertension    IBS (irritable bowel syndrome)    Neuromuscular disorder (HCC)    carpal tunnel   Pelvic pain in female    Post-surgical hypothyroidism    1991 left thyroidectomy   Pre-diabetes    Proteinuria    Seasonal and perennial allergic rhinitis    Wears contact lenses    Wears glasses    Past Surgical History:  Procedure Laterality Date   5 HOUR PH STUDY N/A 11/16/2016   Procedure: 24 HOUR PH STUDY;  Surgeon:  Victory LITTIE Legrand DOUGLAS, MD;  Location: [facility];  Service: Gastroenterology;  Laterality: N/A;   ABDOMINAL HYSTERECTOMY  1998   CARPAL TUNNEL RELEASE Right 06/23/2024   Procedure: CARPAL TUNNEL RELEASE;  Surgeon: Arlinda Buster, MD;  Location: [facility];  Service: Orthopedics;  Laterality: Right;   COLONOSCOPY     COLONOSCOPY WITH PROPOFOL  N/A 08/05/2021   Procedure: COLONOSCOPY WITH PROPOFOL ;  Surgeon: Federico Rosario BROCKS, MD;  Location: [facility];  Service: Gastroenterology;  Laterality: N/A;   CYSTOSCOPY N/A 04/28/2016   Procedure: CYSTOSCOPY;  Surgeon: Norleen Skill, MD;  Location: [facility];  Service: Gynecology;  Laterality: N/A;   ESOPHAGEAL MANOMETRY N/A 11/16/2016   Procedure: ESOPHAGEAL MANOMETRY (EM);  Surgeon: Victory LITTIE Legrand DOUGLAS, MD;  Location: [facility];  Service: Gastroenterology;  Laterality: N/A;   ESOPHAGOGASTRODUODENOSCOPY     FINGER ARTHROPLASTY Right 06/23/2024   Procedure: ARTHROPLASTY, FINGER;  Surgeon: Arlinda Buster, MD;  Location: [facility];  Service: Orthopedics;  Laterality: Right;  RIGHT THUMB CARPOMATACARPAL ARTHROPLASTY WITH INTERNAL BRACE/ RIGHT OPEN CARPAL TUNNEL RELEASE   LAPAROSCOPIC CHOLECYSTECTOMY  1997   LAPAROSCOPY N/A 03/26/2016   Procedure: LAPAROSCOPY DIAGNOSTIC, LYSIS OF ADHESIONS;  Surgeon: Norleen Skill, MD;  Location: [facility];  Service:  Gynecology;  Laterality: N/A;   LAPAROTOMY N/A 04/28/2016   Procedure: EXPLORATORY LAPAROTOMY;  Surgeon: Norleen Skill, MD;  Location: [facility];  Service: Gynecology;  Laterality: N/A;   RENAL BIOPSY     SALPINGOOPHORECTOMY Bilateral 04/28/2016   Procedure: Bilateral OOPHORECTOMY;  Surgeon: Norleen Skill, MD;  Location: [facility];  Service: Gynecology;  Laterality: Bilateral;   THYROIDECTOMY Left 1991   TRANSTHORACIC ECHOCARDIOGRAM  04/27/2012   normal LV, ef 60-65%/  trivial MR   TUBAL LIGATION  1992   Patient Active Problem List   Diagnosis Date Noted   Arthritis of  carpometacarpal [facility]) joint of right thumb 06/23/2024   Chronic pain of right wrist 08/16/2023   Hypomagnesemia 10/06/2021   Hypercalcemia 10/06/2021   Colon cancer screening    Carpal tunnel syndrome, right upper limb 01/07/2021   Flexor carpi radialis tendinitis 01/07/2021   Vitamin D  deficiency 05/19/2019   IGT (impaired glucose tolerance) 05/19/2019   Exposure to medical diagnostic radiation 02/09/2017   S/P bilateral salpingo-oophorectomy 04/28/2016   Pelvic adhesions 03/26/2016    Class: Present on Admission   History of pulmonary embolism 08/01/2014   CKD (chronic kidney disease) stage 3, GFR 30-59 ml/min (HCC) 02/27/2014   Hypothyroidism 04/28/2012   Hemorrhage of rectum and anus 02/09/2011   Constipation 02/09/2011   Dyslipidemia 08/26/2009   Backache 02/14/2008   Allergic rhinitis 11/15/2007   GERD 06/07/2007   Anxiety state 04/27/2007   Essential hypertension 04/27/2007   Disorder of kidney and ureter 03/17/2007   EMBOLISM/THROMBOSIS, DEEP VSL PRXML LWR EXTRM 03/16/2007    ONSET DATE: 05/25/2024 (Date of referral); DOS: 06/23/2024  REFERRING DIAG:  M18.11 (ICD-10-CM) - Arthritis of carpometacarpal (CMC) joint of right thumb  G56.01 (ICD-10-CM) - Carpal tunnel syndrome, right upper limb   THERAPY DIAG:  Pain in joint of right hand  Muscle weakness (generalized)  Other lack of coordination  Localized edema  Scar condition and fibrosis of skin  Other symptoms and signs involving the musculoskeletal system  Pain in right wrist  Stiffness of right wrist, not elsewhere classified  Rationale for Evaluation and Treatment: Rehabilitation  SUBJECTIVE:   SUBJECTIVE STATEMENT:  Pt reports pain 5-6/10 due to tingling, numbness, and swelling. However, later severe pain with PIP/DIP flexion of digits 2-5  Pt accompanied by: Self  PERTINENT HISTORY: Pt was seen in 2024 and  2025 by OP OT to address Rt hand/wrist/thumb pain the underwent right thumb  carpometacarpal arthroplasty with tendon transfer flexor carpi radialis to abductor pollicis longus. Right partial trapezoidectomy. Right wrist first extensor compartment release. Right open carpal tunnel release.  on 06/23/2024 after conservative measures were unsuccessful.   PRECAUTIONS: [place]  Hand Protocol volume 1 (pages 35-38) [place]  Hand Protocol treatment volume 3 (pages [id number])  As per [place]  Hand Protocol for post op Right [facility] arthroplasty and pertinent history/MD notes above.  WEIGHT BEARING RESTRICTIONS:Yes , NWB, No functional use right hand/wrist/thumb  PAIN:  Are you having pain? Yes: NPRS scale: 5-8/10 in Rt wrist and fingers Pain location: Rt palm, areas of incision  Pain description: numbin, burning tingling; spasms are more painful Aggravating factors: sleeping on back Relieving factors: Rest and heat  Worst pain in last 24 hours: 8/10   FALLS: Has patient fallen in last 6 months? Yes. Number of falls 1 - 8/21 while assisting a pt, pt had muscle spasms in back as a result  PLOF: Independent  PATIENT GOALS: To improve use of RUE  NEXT MD VISIT: 07/31/2024 at 2:45 with Dr. Erwin  OBJECTIVE: (All objective assessments below are from initial evaluation on: 07/06/2024 unless otherwise specified.)   HAND DOMINANCE: Right   ADLs: Overall ADLs: mostly mod I though requires occasional assistance from husband for fasteners.   FUNCTIONAL OUTCOME MEASURES: QuickDASH: 88.6 % with use of RUE   UPPER EXTREMITY ROM:     AROM Right (eval) Left (eval)  Shoulder flexion WNL WNL  Shoulder abduction  WNL  Elbow flexion WFL WNL  Elbow extension WFL with increased pain WNL  Wrist flexion  WNL  Wrist extension  WNL  Wrist pronation  WNL  Wrist supination  WNL   Digit Composite Flexion  WNL  Digit Composite Extension  WNL  Digit Opposition  WNL  (Blank rows = not tested)  HAND FUNCTION: Grip strength: Right: Deferred lbs; Left: NT lbs  Lateral pinch: Right:  Deferred lbs, Left: NT lbs  3 point pinch: Right: Deferred lbs, Left: NT lbs   COORDINATION: 9 Hole Peg test: Right: Deferred sec; Left: NT sec  SENSATION: Eval:  severe paresthesias reported through RUE  EDEMA:   Eval: mild to moderate edema appreciated  COGNITION: Eval: Overall cognitive status: WFL for evaluation today   OBSERVATIONS:   Pt ambulates without use of AD. No loss of balance. The pt appears well kept and has arm sling donned to RUE. Incisions scabbed over following suture removal. Pt holds RUE in guarded positioning including palm.  TODAY'S TREATMENT:   Reviewed previously issued HEP's w/ emphasis on finger ROM (except thumb) and wrist extension.   Ultrasound x 8 minutes, 3 Mhz, 0.8 wts/cm2, 20% pulsed over both incisions and palm of hand  Pt very sensitive to PIP/DIP flexion of fingers (no thumb) w/ PROM and became tearful, however after ultrasound, breathing techniques, and allowing A/ROM first then PROM, pt able to tolerate much more IP flexion and could make 75% to 90% full composite flexion (except thumb)  Also emphasized strapping wrist all the way down in splint as pt was in wrist flexion inside of splint and when taking off to rest hand completely on pillow, to ensure wrist is neutral to some extension, not in flexion. Recommended elevating above heart but not across chest.   Worked on active, passive, and place and hold ex's for wrist extension and finger flexion. Also worked on isolated IP flexion of fingers, and full composite flexion into MP ext followed by IP ext.  Pt also instructed on performing thumb IP flexion only for thumb. Recommended also performing exercises after using heat or soaking hand in warm/hot water  Pt almost 4 weeks post-op but given the extensive surgeries to thumb, decided to wait until next week to begin ROM of thumb  Pt also given bed positioning handout and instructed how to sleep to support RUE/hand with splint on  PATIENT  EDUCATION: Education details: Tendon glides, edema and scar massage  Person educated: Patient Education method: Engineer, Structural, Teach back, Handouts  Education comprehension: States and demonstrates understanding, Additional Education required   HOME EXERCISE PROGRAM: 07/06/2024: R digit and wrist ROM (excluding thumb); contrast baths; heat  GOALS: SHORT TERM GOALS:  Target Date: 08/07/2024    Pt will obtain protective, custom forearm based thumb spica splint/orthotic. Goal status: In progress 07/11/24: Modifications made.  Will need hand based splint in future   2.  Pt will demo/state understanding of initial HEP to improve pain levels and prerequisite motion for functional use of right hand/thumb. Goal status: IN Progress  LONG TERM GOALS: Target Date: 09/22/2024  Patient will demonstrate at least 16% improvement with quick Dash score (reporting 72.6% disability or less) indicating improved functional use of affected extremity.  Baseline: 88.6% disability with use fo RUE.  Goal status: INITIAL   2.  Pt will improve grip strength in right hand from unable to assess to at least 15 lbs for functional use at home and in IADLs. Goal status: INITIAL   3.  Pt will improve A/ROM in right thumb from opposition to tip of index finger on right to at least opposition to PIP (or greater) of right small finger, to have functional motion for tasks like ADLs, reach, and grasp.  Goal status: INITIAL   4.  Pt will improve right hand functional use from unable to assess to at least Mod I for basic ADLs (ie, brushing her hair, bathing, brushing her teeth and folding laundry) to assist in ability to carry out selfcare and higher-level homecare tasks with less difficulty. Goal status: INITIAL   5.  Pt will improve coordination skills in right hand, as seen by better score on 9 hole peg testing to have increased functional ability to carry out fine motor tasks (fasteners, etc.) and more complex,  coordinated IADLs (meal prep, sports, etc.).  Goal status: INITIAL/TBD   6.  Pt will decrease pain at worst from 7/10 to 2/10 or less to have better sleep and occupational participation in daily roles. Goal status: IN Progress   ASSESSMENT:  CLINICAL IMPRESSION: Patient is a 53 y.o. female who was seen today for occupational therapy treatment for R [facility] arthroplasty and CTR 06/23/2024.  Progression of finger ROM with thumb IP flexion in splint reviewed.  Pt will benefit from further outpatient OT to address deficits in A/ROM, decreased functional use of right hand/thumb, pain, pt education, scar management/desensitization, splinting, edema control, and overall functional use and strengthening of the affected hand.   PERFORMANCE DEFICITS: in functional skills including ADLs, IADLs, coordination, ROM, strength, pain, fascial restrictions, muscle spasms, flexibility, Gross motor control, body mechanics, endurance, decreased knowledge of precautions, and UE functional use, cognitive skills including problem solving, and psychosocial skills including coping strategies, environmental adaptation, and habits.   IMPAIRMENTS: are limiting patient from ADLs, IADLs, rest and sleep, work, and leisure.   COMORBIDITIES: may have co-morbidities  that affects occupational performance. Patient will benefit from skilled OT to address above impairments and improve overall function.  REHAB POTENTIAL: Excellent  PLAN:  OT FREQUENCY: 1-2x/week  OT DURATION: up to 10 weeks   PLANNED INTERVENTIONS: 97168 OT Re-evaluation, 97535 self care/ADL training, 02889 therapeutic exercise, 97530 therapeutic activity, 97112 neuromuscular re-education, 97140 manual therapy, 97035 ultrasound, 97018 paraffin, 02960 fluidotherapy, 97010 moist heat, 97010 cryotherapy, 97032 electrical stimulation (manual), 97750 Physical Performance Testing, 02239 Orthotic Initial, [id number] Prosthetic Initial, [id number] Orthotic/Prosthetic subsequent, scar  mobilization, passive range of motion, compression bandaging, Dry needling, energy conservation, coping strategies training, patient/family education, and DME and/or AE instructions  RECOMMENDED OTHER SERVICES: None now  CONSULTED AND AGREED WITH PLAN OF CARE: Patient  PLAN FOR NEXT SESSION:   Review scar massage/desensitization  Review HEP and progress to thumb A/ROM next week (pg 266 from volume 3) Assess wrist extension and finger IP flexion [place]  Hand Protocol volume 1 (pages 35-38) [place]  Hand Protocol treatment volume 3 (pages 263-268)  Burnard JINNY Roads, OTR/L 07/19/2024, 4:17 PM

## 2024-07-26 ENCOUNTER — Ambulatory Visit: Payer: PRIVATE HEALTH INSURANCE | Admitting: Occupational Therapy

## 2024-07-26 ENCOUNTER — Encounter: Payer: Self-pay | Admitting: Occupational Therapy

## 2024-07-26 DIAGNOSIS — L905 Scar conditions and fibrosis of skin: Secondary | ICD-10-CM

## 2024-07-26 DIAGNOSIS — R600 Localized edema: Secondary | ICD-10-CM

## 2024-07-26 DIAGNOSIS — M6281 Muscle weakness (generalized): Secondary | ICD-10-CM

## 2024-07-26 DIAGNOSIS — M25541 Pain in joints of right hand: Secondary | ICD-10-CM | POA: Diagnosis not present

## 2024-07-26 DIAGNOSIS — M25631 Stiffness of right wrist, not elsewhere classified: Secondary | ICD-10-CM

## 2024-07-26 DIAGNOSIS — R278 Other lack of coordination: Secondary | ICD-10-CM

## 2024-07-26 DIAGNOSIS — M25531 Pain in right wrist: Secondary | ICD-10-CM

## 2024-07-26 NOTE — Therapy (Signed)
 OUTPATIENT OCCUPATIONAL THERAPY ORTHO TREATMENT  Patient Name: Rhonda [name] MRN: [id number] DOB:[birth year], 53 y.o., female Today's Date: 07/26/2024  PCP: Theophilus Delma Krabbe, MD  REFERRING PROVIDER: Arlinda Buster, MD  END OF SESSION:  OT End of Session - 07/26/24 1409     Visit Number 4    Number of Visits 21    Date for Recertification  09/22/24    Authorization Type BC[employer]    OT Start Time 1405    OT Stop Time 1445    OT Time Calculation (min) 40 min    Equipment Utilized During Treatment Splint    Activity Tolerance Patient tolerated treatment well;Patient limited by pain    Behavior During Therapy WFL for tasks assessed/performed         Past Medical History:  Diagnosis Date   Allergy    Anxiety    C1q nephropathy    Cancer (HCC)    history of thyroid  cancer   Chronic constipation    CKD (chronic kidney disease), stage II nephrologist-  dr dominique pipe [facility])   secondary to C1q nephropathy/  hypertension   Family history of adverse reaction to anesthesia    father hard to put down for anesthesia   GERD (gastroesophageal reflux disease)    H/O radioactive iodine thyroid  ablation    1992  right throid   History of exercise intolerance    01-052011  ETT--  normal w/ no ischemia per cardiologist (dr rolan) epic note   History of pulmonary embolus (PE)    07/ 2008-- treated w/ coumadin   History of syncope    recurrent --  workup done per epic -- dx orthostatic hypotension   Hyperlipidemia    Hypertension    IBS (irritable bowel syndrome)    Neuromuscular disorder (HCC)    carpal tunnel   Pelvic pain in female    Post-surgical hypothyroidism    1991 left thyroidectomy   Pre-diabetes    Proteinuria    Seasonal and perennial allergic rhinitis    Wears contact lenses    Wears glasses    Past Surgical History:  Procedure Laterality Date   69 HOUR PH STUDY N/A 11/16/2016   Procedure: 24 HOUR PH STUDY;  Surgeon:  Victory LITTIE Legrand DOUGLAS, MD;  Location: [facility];  Service: Gastroenterology;  Laterality: N/A;   ABDOMINAL HYSTERECTOMY  1998   CARPAL TUNNEL RELEASE Right 06/23/2024   Procedure: CARPAL TUNNEL RELEASE;  Surgeon: Arlinda Buster, MD;  Location: [facility];  Service: Orthopedics;  Laterality: Right;   COLONOSCOPY     COLONOSCOPY WITH PROPOFOL  N/A 08/05/2021   Procedure: COLONOSCOPY WITH PROPOFOL ;  Surgeon: Federico Rosario BROCKS, MD;  Location: [facility];  Service: Gastroenterology;  Laterality: N/A;   CYSTOSCOPY N/A 04/28/2016   Procedure: CYSTOSCOPY;  Surgeon: Norleen Skill, MD;  Location: [facility];  Service: Gynecology;  Laterality: N/A;   ESOPHAGEAL MANOMETRY N/A 11/16/2016   Procedure: ESOPHAGEAL MANOMETRY (EM);  Surgeon: Victory LITTIE Legrand DOUGLAS, MD;  Location: [facility];  Service: Gastroenterology;  Laterality: N/A;   ESOPHAGOGASTRODUODENOSCOPY     FINGER ARTHROPLASTY Right 06/23/2024   Procedure: ARTHROPLASTY, FINGER;  Surgeon: Arlinda Buster, MD;  Location: [facility];  Service: Orthopedics;  Laterality: Right;  RIGHT THUMB CARPOMATACARPAL ARTHROPLASTY WITH INTERNAL BRACE/ RIGHT OPEN CARPAL TUNNEL RELEASE   LAPAROSCOPIC CHOLECYSTECTOMY  1997   LAPAROSCOPY N/A 03/26/2016   Procedure: LAPAROSCOPY DIAGNOSTIC, LYSIS OF ADHESIONS;  Surgeon: Norleen Skill, MD;  Location: [facility];  Service:  Gynecology;  Laterality: N/A;   LAPAROTOMY N/A 04/28/2016   Procedure: EXPLORATORY LAPAROTOMY;  Surgeon: Norleen Skill, MD;  Location: [facility];  Service: Gynecology;  Laterality: N/A;   RENAL BIOPSY     SALPINGOOPHORECTOMY Bilateral 04/28/2016   Procedure: Bilateral OOPHORECTOMY;  Surgeon: Norleen Skill, MD;  Location: [facility];  Service: Gynecology;  Laterality: Bilateral;   THYROIDECTOMY Left 1991   TRANSTHORACIC ECHOCARDIOGRAM  04/27/2012   normal LV, ef 60-65%/  trivial MR   TUBAL LIGATION  1992   Patient Active Problem List   Diagnosis Date Noted   Arthritis of  carpometacarpal [facility]) joint of right thumb 06/23/2024   Chronic pain of right wrist 08/16/2023   Hypomagnesemia 10/06/2021   Hypercalcemia 10/06/2021   Colon cancer screening    Carpal tunnel syndrome, right upper limb 01/07/2021   Flexor carpi radialis tendinitis 01/07/2021   Vitamin D  deficiency 05/19/2019   IGT (impaired glucose tolerance) 05/19/2019   Exposure to medical diagnostic radiation 02/09/2017   S/P bilateral salpingo-oophorectomy 04/28/2016   Pelvic adhesions 03/26/2016    Class: Present on Admission   History of pulmonary embolism 08/01/2014   CKD (chronic kidney disease) stage 3, GFR 30-59 ml/min (HCC) 02/27/2014   Hypothyroidism 04/28/2012   Hemorrhage of rectum and anus 02/09/2011   Constipation 02/09/2011   Dyslipidemia 08/26/2009   Backache 02/14/2008   Allergic rhinitis 11/15/2007   GERD 06/07/2007   Anxiety state 04/27/2007   Essential hypertension 04/27/2007   Disorder of kidney and ureter 03/17/2007   EMBOLISM/THROMBOSIS, DEEP VSL PRXML LWR EXTRM 03/16/2007    ONSET DATE: 05/25/2024 (Date of referral); DOS: 06/23/2024  REFERRING DIAG:  M18.11 (ICD-10-CM) - Arthritis of carpometacarpal (CMC) joint of right thumb  G56.01 (ICD-10-CM) - Carpal tunnel syndrome, right upper limb   THERAPY DIAG:  Pain in joint of right hand  Muscle weakness (generalized)  Other lack of coordination  Localized edema  Scar condition and fibrosis of skin  Pain in right wrist  Stiffness of right wrist, not elsewhere classified  Rationale for Evaluation and Treatment: Rehabilitation  SUBJECTIVE:   SUBJECTIVE STATEMENT:  Pt reports pain 4-5/10 due to tingling, numbness, and swelling. The numbness and tingling are really what still bothers me  Pt accompanied by: Self  PERTINENT HISTORY: Pt was seen in 2024 and  2025 by OP OT to address Rt hand/wrist/thumb pain the underwent right thumb carpometacarpal arthroplasty with tendon transfer flexor carpi radialis to  abductor pollicis longus. Right partial trapezoidectomy. Right wrist first extensor compartment release. Right open carpal tunnel release.  on 06/23/2024 after conservative measures were unsuccessful.   PRECAUTIONS: [place]  Hand Protocol volume 1 (pages 35-38) [place]  Hand Protocol treatment volume 3 (pages [phone])  As per [place]  Hand Protocol for post op Right [facility] arthroplasty and pertinent history/MD notes above.  WEIGHT BEARING RESTRICTIONS:Yes , NWB, No functional use right hand/wrist/thumb  PAIN:  Are you having pain? Yes: NPRS scale: 5-8/10 in Rt wrist and fingers Pain location: Rt palm, areas of incision  Pain description: numbin, burning tingling; spasms are more painful Aggravating factors: sleeping on back Relieving factors: Rest and heat  Worst pain in last 24 hours: 8/10   FALLS: Has patient fallen in last 6 months? Yes. Number of falls 1 - 8/21 while assisting a pt, pt had muscle spasms in back as a result  PLOF: Independent  PATIENT GOALS: To improve use of RUE  NEXT MD VISIT: 07/31/2024 at 2:45 with Dr. Erwin  OBJECTIVE: (All objective assessments below are from initial evaluation  on: 07/06/2024 unless otherwise specified.)   HAND DOMINANCE: Right   ADLs: Overall ADLs: mostly mod I though requires occasional assistance from husband for fasteners.   FUNCTIONAL OUTCOME MEASURES: QuickDASH: 88.6 % with use of RUE   UPPER EXTREMITY ROM:     AROM Right (eval) Left (eval)  Shoulder flexion WNL WNL  Shoulder abduction  WNL  Elbow flexion WFL WNL  Elbow extension WFL with increased pain WNL  Wrist flexion  WNL  Wrist extension  WNL  Wrist pronation  WNL  Wrist supination  WNL   Digit Composite Flexion  WNL  Digit Composite Extension  WNL  Digit Opposition  WNL  (Blank rows = not tested)  HAND FUNCTION: Grip strength: Right: Deferred lbs; Left: NT lbs  Lateral pinch: Right: Deferred lbs, Left: NT lbs  3 point pinch: Right: Deferred lbs, Left: NT lbs    COORDINATION: 9 Hole Peg test: Right: Deferred sec; Left: NT sec  SENSATION: Eval:  severe paresthesias reported through RUE  EDEMA:   Eval: mild to moderate edema appreciated  COGNITION: Eval: Overall cognitive status: WFL for evaluation today   OBSERVATIONS:   Pt ambulates without use of AD. No loss of balance. The pt appears well kept and has arm sling donned to RUE. Incisions scabbed over following suture removal. Pt holds RUE in guarded positioning including palm.  TODAY'S TREATMENT:   Pt issued A/ROM and self P/ROM HEP for Rt thumb (volume 3: pgs [phone]) with exception to #5 on 2nd page. Pt began with A/ROM and therapist reviewed each ex extensively with patient. Then performed ultrasound to address swelling before performing self guided P/ROM  Ultrasound x 10 minutes, 3 Mhz, 0.8 wts/cm2, 20% pulsed over both incisions and palm of hand  Progressed to self guided P/ROM (pg 267) to increase thumb ROM. Pt very limited in thumb active and passive motion, also limited by mod edema at thumb.   Pt can tolerate more active and passive PIP flexion of fingers (index to small fingers) and stressed continued aggressive finger ROM; as well as active, passive, place and hold wrist extension.   Therapist wrote down questions to ask Dr. Erwin at her appt on Monday 07/31/24 including: if ok to fabricate hand based CMC splint  07/26/24:  Rt Wrist extension = 25*  Rt PIP flexion digits 2-5 = 85*, 90*, 100*, 95*   PATIENT EDUCATION: Education details: Tendon glides, edema and scar massage  Person educated: Patient Education method: Engineer, Structural, Teach back, Handouts  Education comprehension: States and demonstrates understanding, Additional Education required   HOME EXERCISE PROGRAM: 07/06/2024: R digit and wrist ROM (excluding thumb); contrast baths; heat  GOALS: SHORT TERM GOALS:  Target Date: 08/07/2024    Pt will obtain protective, custom forearm based thumb spica  splint/orthotic. Goal status: In progress 07/11/24: Modifications made.  Will need hand based splint in future   2.  Pt will demo/state understanding of initial HEP to improve pain levels and prerequisite motion for functional use of right hand/thumb. Goal status: IN Progress  LONG TERM GOALS: Target Date: 09/22/2024  Patient will demonstrate at least 16% improvement with quick Dash score (reporting 72.6% disability or less) indicating improved functional use of affected extremity.  Baseline: 88.6% disability with use fo RUE.  Goal status: INITIAL   2.  Pt will improve grip strength in right hand from unable to assess to at least 15 lbs for functional use at home and in IADLs. Goal status: INITIAL   3.  Pt  will improve A/ROM in right thumb from opposition to tip of index finger on right to at least opposition to PIP (or greater) of right small finger, to have functional motion for tasks like ADLs, reach, and grasp.  Goal status: INITIAL   4.  Pt will improve right hand functional use from unable to assess to at least Mod I for basic ADLs (ie, brushing her hair, bathing, brushing her teeth and folding laundry) to assist in ability to carry out selfcare and higher-level homecare tasks with less difficulty. Goal status: INITIAL   5.  Pt will improve coordination skills in right hand, as seen by better score on 9 hole peg testing to have increased functional ability to carry out fine motor tasks (fasteners, etc.) and more complex, coordinated IADLs (meal prep, sports, etc.).  Goal status: INITIAL/TBD   6.  Pt will decrease pain at worst from 7/10 to 2/10 or less to have better sleep and occupational participation in daily roles. Goal status: IN Progress   ASSESSMENT:  CLINICAL IMPRESSION: Patient is a 53 y.o. female who was seen today for occupational therapy treatment for R [facility] arthroplasty and CTR 06/23/2024.  Pt tolerating more finger ROM, but extremely limited in thumb ROM and moderate  edema. Pt benefits from review of HEP's and recommendations.   PERFORMANCE DEFICITS: in functional skills including ADLs, IADLs, coordination, ROM, strength, pain, fascial restrictions, muscle spasms, flexibility, Gross motor control, body mechanics, endurance, decreased knowledge of precautions, and UE functional use, cognitive skills including problem solving, and psychosocial skills including coping strategies, environmental adaptation, and habits.   IMPAIRMENTS: are limiting patient from ADLs, IADLs, rest and sleep, work, and leisure.   COMORBIDITIES: may have co-morbidities  that affects occupational performance. Patient will benefit from skilled OT to address above impairments and improve overall function.  REHAB POTENTIAL: Excellent  PLAN:  OT FREQUENCY: 1-2x/week  OT DURATION: up to 10 weeks   PLANNED INTERVENTIONS: 97168 OT Re-evaluation, 97535 self care/ADL training, 02889 therapeutic exercise, 97530 therapeutic activity, 97112 neuromuscular re-education, 97140 manual therapy, 97035 ultrasound, 97018 paraffin, 02960 fluidotherapy, 97010 moist heat, 97010 cryotherapy, 97032 electrical stimulation (manual), 97750 Physical Performance Testing, 02239 Orthotic Initial, [id number] Prosthetic Initial, [id number] Orthotic/Prosthetic subsequent, scar mobilization, passive range of motion, compression bandaging, Dry needling, energy conservation, coping strategies training, patient/family education, and DME and/or AE instructions  RECOMMENDED OTHER SERVICES: None now  CONSULTED AND AGREED WITH PLAN OF CARE: Patient  PLAN FOR NEXT SESSION:  Progress per [facility] Arthroplasty protocol as able, begin with fluidotherapy if carpal incision completely closed, check on responses from Dr. Erwin (pt sees on 07/31/24) - possibly make hand based CMC splint [place]  Hand Protocol volume 1 (pages 35-38) [place]  Hand Protocol treatment volume 3 (pages 263-268)  Burnard JINNY Roads, OTR/L 07/26/2024, 2:10 PM

## 2024-07-28 ENCOUNTER — Ambulatory Visit: Admitting: Endocrinology

## 2024-07-30 NOTE — Progress Notes (Unsigned)
° °  Rhonda [name] - 53 y.o. female MRN [id number]  Date of birth: 08-Aug-1971  Office Visit Note: Visit Date: 07/31/2024 PCP: Theophilus Andrews, [name], MD Referred by: Theophilus Andrews, [name], MD  Subjective:  HPI: Rhonda [name] is a 53 y.o. female who presents today for follow up 6 weeks status post right thumb carpometacarpal arthroplasty with tendon transfer flexor carpi radialis to abductor pollicis longus. Right partial trapezoidectomy. Right wrist first extensor compartment release. Right open carpal tunnel release. SABRA  Pertinent ROS were reviewed with the patient and found to be negative unless otherwise specified above in HPI.   Assessment & Plan: Visit Diagnoses: No diagnosis found.  Plan: ***  Follow-up: No follow-ups on file.   Meds & Orders: No orders of the defined types were placed in this encounter.  No orders of the defined types were placed in this encounter.    Procedures: No procedures performed       Objective:   Vital Signs: There were no vitals taken for this visit.  Ortho Exam ***  Imaging: No results found.   Yehudah Standing Afton Alderton, M.D. [facility], Hand Surgery

## 2024-07-31 ENCOUNTER — Ambulatory Visit: Admitting: Orthopedic Surgery

## 2024-07-31 DIAGNOSIS — G5601 Carpal tunnel syndrome, right upper limb: Secondary | ICD-10-CM

## 2024-07-31 DIAGNOSIS — M1811 Unilateral primary osteoarthritis of first carpometacarpal joint, right hand: Secondary | ICD-10-CM

## 2024-08-02 ENCOUNTER — Ambulatory Visit: Payer: PRIVATE HEALTH INSURANCE | Admitting: Occupational Therapy

## 2024-08-02 ENCOUNTER — Encounter: Payer: Self-pay | Admitting: Occupational Therapy

## 2024-08-02 DIAGNOSIS — M25541 Pain in joints of right hand: Secondary | ICD-10-CM | POA: Diagnosis not present

## 2024-08-02 DIAGNOSIS — M25642 Stiffness of left hand, not elsewhere classified: Secondary | ICD-10-CM

## 2024-08-02 DIAGNOSIS — R600 Localized edema: Secondary | ICD-10-CM

## 2024-08-02 DIAGNOSIS — R29898 Other symptoms and signs involving the musculoskeletal system: Secondary | ICD-10-CM

## 2024-08-02 DIAGNOSIS — M25631 Stiffness of right wrist, not elsewhere classified: Secondary | ICD-10-CM

## 2024-08-02 NOTE — Therapy (Signed)
 OUTPATIENT OCCUPATIONAL THERAPY ORTHO TREATMENT  Patient Name: Rhonda [name] MRN: [id number] DOB:1971-05-31, 53 y.o., female Today's Date: 08/02/2024  PCP: Theophilus Delma Krabbe, MD  REFERRING PROVIDER: Arlinda Buster, MD  END OF SESSION:  OT End of Session - 08/02/24 1359     Visit Number 5    Number of Visits 21    Date for Recertification  09/22/24    Authorization Type [employer]    OT Start Time 1352    OT Stop Time 1445    OT Time Calculation (min) 53 min    Equipment Utilized During Treatment Splint    Activity Tolerance Patient tolerated treatment well;Patient limited by pain    Behavior During Therapy WFL for tasks assessed/performed         Past Medical History:  Diagnosis Date   Allergy    Anxiety    C1q nephropathy    Cancer (HCC)    history of thyroid  cancer   Chronic constipation    CKD (chronic kidney disease), stage II nephrologist-  dr dominique pipe [facility])   secondary to C1q nephropathy/  hypertension   Family history of adverse reaction to anesthesia    father hard to put down for anesthesia   GERD (gastroesophageal reflux disease)    H/O radioactive iodine thyroid  ablation    1992  right throid   History of exercise intolerance    01-052011  ETT--  normal w/ no ischemia per cardiologist (dr rolan) epic note   History of pulmonary embolus (PE)    07/ 2008-- treated w/ coumadin   History of syncope    recurrent --  workup done per epic -- dx orthostatic hypotension   Hyperlipidemia    Hypertension    IBS (irritable bowel syndrome)    Neuromuscular disorder (HCC)    carpal tunnel   Pelvic pain in female    Post-surgical hypothyroidism    1991 left thyroidectomy   Pre-diabetes    Proteinuria    Seasonal and perennial allergic rhinitis    Wears contact lenses    Wears glasses    Past Surgical History:  Procedure Laterality Date   44 HOUR PH STUDY N/A 11/16/2016   Procedure: 24 HOUR PH STUDY;  Surgeon:  Victory LITTIE Legrand DOUGLAS, MD;  Location: [facility];  Service: Gastroenterology;  Laterality: N/A;   ABDOMINAL HYSTERECTOMY  1998   CARPAL TUNNEL RELEASE Right 06/23/2024   Procedure: CARPAL TUNNEL RELEASE;  Surgeon: Arlinda Buster, MD;  Location: [facility];  Service: Orthopedics;  Laterality: Right;   COLONOSCOPY     COLONOSCOPY WITH PROPOFOL  N/A 08/05/2021   Procedure: COLONOSCOPY WITH PROPOFOL ;  Surgeon: Federico Rosario BROCKS, MD;  Location: [facility];  Service: Gastroenterology;  Laterality: N/A;   CYSTOSCOPY N/A 04/28/2016   Procedure: CYSTOSCOPY;  Surgeon: Norleen Skill, MD;  Location: [facility];  Service: Gynecology;  Laterality: N/A;   ESOPHAGEAL MANOMETRY N/A 11/16/2016   Procedure: ESOPHAGEAL MANOMETRY (EM);  Surgeon: Victory LITTIE Legrand DOUGLAS, MD;  Location: [facility];  Service: Gastroenterology;  Laterality: N/A;   ESOPHAGOGASTRODUODENOSCOPY     FINGER ARTHROPLASTY Right 06/23/2024   Procedure: ARTHROPLASTY, FINGER;  Surgeon: Arlinda Buster, MD;  Location: [facility];  Service: Orthopedics;  Laterality: Right;  RIGHT THUMB CARPOMATACARPAL ARTHROPLASTY WITH INTERNAL BRACE/ RIGHT OPEN CARPAL TUNNEL RELEASE   LAPAROSCOPIC CHOLECYSTECTOMY  1997   LAPAROSCOPY N/A 03/26/2016   Procedure: LAPAROSCOPY DIAGNOSTIC, LYSIS OF ADHESIONS;  Surgeon: Norleen Skill, MD;  Location: [facility] [place];  Service:  Gynecology;  Laterality: N/A;   LAPAROTOMY N/A 04/28/2016   Procedure: EXPLORATORY LAPAROTOMY;  Surgeon: Norleen Skill, MD;  Location: [facility];  Service: Gynecology;  Laterality: N/A;   RENAL BIOPSY     SALPINGOOPHORECTOMY Bilateral 04/28/2016   Procedure: Bilateral OOPHORECTOMY;  Surgeon: Norleen Skill, MD;  Location: [facility];  Service: Gynecology;  Laterality: Bilateral;   THYROIDECTOMY Left 1991   TRANSTHORACIC ECHOCARDIOGRAM  04/27/2012   normal LV, ef 60-65%/  trivial MR   TUBAL LIGATION  1992   Patient Active Problem List   Diagnosis Date Noted   Arthritis of  carpometacarpal [facility]) joint of right thumb 06/23/2024   Chronic pain of right wrist 08/16/2023   Hypomagnesemia 10/06/2021   Hypercalcemia 10/06/2021   Colon cancer screening    Carpal tunnel syndrome, right upper limb 01/07/2021   Flexor carpi radialis tendinitis 01/07/2021   Vitamin D  deficiency 05/19/2019   IGT (impaired glucose tolerance) 05/19/2019   Exposure to medical diagnostic radiation 02/09/2017   S/P bilateral salpingo-oophorectomy 04/28/2016   Pelvic adhesions 03/26/2016    Class: Present on Admission   History of pulmonary embolism 08/01/2014   CKD (chronic kidney disease) stage 3, GFR 30-59 ml/min (HCC) 02/27/2014   Hypothyroidism 04/28/2012   Hemorrhage of rectum and anus 02/09/2011   Constipation 02/09/2011   Dyslipidemia 08/26/2009   Backache 02/14/2008   Allergic rhinitis 11/15/2007   GERD 06/07/2007   Anxiety state 04/27/2007   Essential hypertension 04/27/2007   Disorder of kidney and ureter 03/17/2007   EMBOLISM/THROMBOSIS, DEEP VSL PRXML LWR EXTRM 03/16/2007    ONSET DATE: 05/25/2024 (Date of referral); DOS: 06/23/2024  REFERRING DIAG:  M18.11 (ICD-10-CM) - Arthritis of carpometacarpal (CMC) joint of right thumb  G56.01 (ICD-10-CM) - Carpal tunnel syndrome, right upper limb   THERAPY DIAG:  Stiffness of right wrist, not elsewhere classified  Other symptoms and signs involving the musculoskeletal system  Stiffness of left hand, not elsewhere classified  Localized edema  Pain in joint of right hand  Rationale for Evaluation and Treatment: Rehabilitation  SUBJECTIVE:   SUBJECTIVE STATEMENT:  Pt reports pain 4-5/10 after exercises from soreness (Rt thumb). Thumb otherwise doesn't hurt except the numbness and tingling  Pt accompanied by: Self  PERTINENT HISTORY: Pt was seen in 2024 and  2025 by OP OT to address Rt hand/wrist/thumb pain the underwent right thumb carpometacarpal arthroplasty with tendon transfer flexor carpi radialis to abductor  pollicis longus. Right partial trapezoidectomy. Right wrist first extensor compartment release. Right open carpal tunnel release.  on 06/23/2024 after conservative measures were unsuccessful.   PRECAUTIONS: per MD 07/31/24: She is doing well postoperatively.  She can convert her wrist splint to a hand-based thumb spica orthosis at this point as well.  Continue with range of motion exercises for the next 2 weeks, progressive strengthening at week 8 per protocol.   As per [place]  Hand Protocol for post op Right [facility] arthroplasty and pertinent history/MD notes above.  WEIGHT BEARING RESTRICTIONS:Yes , NWB, No functional use right hand/wrist/thumb  PAIN:  Are you having pain? Yes: NPRS scale: 5/10 in Rt wrist and fingers Pain location: Rt palm, areas of incision  Pain description: numbin, burning tingling; spasms are more painful Aggravating factors: sleeping on back Relieving factors: Rest and heat   FALLS: Has patient fallen in last 6 months? Yes. Number of falls 1 - 8/21 while assisting a pt, pt had muscle spasms in back as a result  PLOF: Independent  PATIENT GOALS: To improve use of RUE  NEXT  MD VISIT:   OBJECTIVE: (All objective assessments below are from initial evaluation on: 07/06/2024 unless otherwise specified.)   HAND DOMINANCE: Right   ADLs: Overall ADLs: mostly mod I though requires occasional assistance from husband for fasteners.   FUNCTIONAL OUTCOME MEASURES: QuickDASH: 88.6 % with use of RUE   UPPER EXTREMITY ROM:     AROM Right (eval) Left (eval)  Shoulder flexion WNL WNL  Shoulder abduction  WNL  Elbow flexion WFL WNL  Elbow extension WFL with increased pain WNL  Wrist flexion  WNL  Wrist extension  WNL  Wrist pronation  WNL  Wrist supination  WNL   Digit Composite Flexion  WNL  Digit Composite Extension  WNL  Digit Opposition  WNL  (Blank rows = not tested)  HAND FUNCTION: Grip strength: Right: Deferred lbs; Left: NT lbs  Lateral pinch: Right:  Deferred lbs, Left: NT lbs  3 point pinch: Right: Deferred lbs, Left: NT lbs   COORDINATION: 9 Hole Peg test: Right: Deferred sec; Left: NT sec  SENSATION: Eval:  severe paresthesias reported through RUE  EDEMA:   Eval: mild to moderate edema appreciated  COGNITION: Eval: Overall cognitive status: WFL for evaluation today   OBSERVATIONS:   Pt ambulates without use of AD. No loss of balance. The pt appears well kept and has arm sling donned to RUE. Incisions scabbed over following suture removal. Pt holds RUE in guarded positioning including palm.  TODAY'S TREATMENT:   See latest update from MD on 07/31/24 above -   Fabricated and fitted hand based thumb CMC splint (per MD ok) and issued - reviewed wear and care with orficast material.   Extensively reviewed A/ROM and self guided P/ROM exercises for thumb movements (flexion, opposition, palmer and radial abduction)  Also emphasized importance of wrist extension including; A/ROM, P/ROM and place and hold ex's and aggressive finger ROM   Fluidotherapy x 10 minutes for Rt hand to address pain, swelling, and stiffness. No adverse reactions.    07/26/24:  Rt Wrist extension = 25*  Rt PIP flexion digits 2-5 = 85*, 90*, 100*, 95*   PATIENT EDUCATION: Education details: see above Person educated: Patient Education method: Engineer, Structural, Teach back, Education comprehension: States and demonstrates understanding, Additional Education required   HOME EXERCISE PROGRAM: 07/06/2024: R digit and wrist ROM (excluding thumb); contrast baths; heat   GOALS: SHORT TERM GOALS:  Target Date: 08/07/2024    Pt will obtain protective, custom forearm based thumb spica splint/orthotic. 07/11/24: Modifications made.  Will need hand based splint in future Goal status: MET   2.  Pt will demo/state understanding of initial HEP to improve pain levels and prerequisite motion for functional use of right hand/thumb. Goal status:  MET  LONG  TERM GOALS: Target Date: 09/22/2024  Patient will demonstrate at least 16% improvement with quick Dash score (reporting 72.6% disability or less) indicating improved functional use of affected extremity.  Baseline: 88.6% disability with use fo RUE.  Goal status: INITIAL   2.  Pt will improve grip strength in right hand from unable to assess to at least 15 lbs for functional use at home and in IADLs. Goal status: INITIAL   3.  Pt will improve A/ROM in right thumb from opposition to tip of index finger on right to at least opposition to PIP (or greater) of right small finger, to have functional motion for tasks like ADLs, reach, and grasp.  Goal status: INITIAL   4.  Pt will improve right hand functional  use from unable to assess to at least Mod I for basic ADLs (ie, brushing her hair, bathing, brushing her teeth and folding laundry) to assist in ability to carry out selfcare and higher-level homecare tasks with less difficulty. Goal status: INITIAL   5.  Pt will improve coordination skills in right hand, as seen by better score on 9 hole peg testing to have increased functional ability to carry out fine motor tasks (fasteners, etc.) and more complex, coordinated IADLs (meal prep, sports, etc.).  Goal status: INITIAL/TBD   6.  Pt will decrease pain at worst from 7/10 to 2/10 or less to have better sleep and occupational participation in daily roles. Goal status: IN Progress   ASSESSMENT:  CLINICAL IMPRESSION: Patient is a 53 y.o. female who was seen today for occupational therapy treatment for R [facility] arthroplasty and CTR 06/23/2024.  Pt saw MD on 07/31/24 and he cleared her to reduce down to hand based thumb splint and can begin strengthening at 8 weeks post-op. Pt benefits from review of HEP's and recommendations, and is demo increased ROM of wrist, digits, and thumb.  PERFORMANCE DEFICITS: in functional skills including ADLs, IADLs, coordination, ROM, strength, pain, fascial restrictions,  muscle spasms, flexibility, Gross motor control, body mechanics, endurance, decreased knowledge of precautions, and UE functional use, cognitive skills including problem solving, and psychosocial skills including coping strategies, environmental adaptation, and habits.   IMPAIRMENTS: are limiting patient from ADLs, IADLs, rest and sleep, work, and leisure.   COMORBIDITIES: may have co-morbidities  that affects occupational performance. Patient will benefit from skilled OT to address above impairments and improve overall function.  REHAB POTENTIAL: Excellent  PLAN:  OT FREQUENCY: 1-2x/week  OT DURATION: up to 10 weeks   PLANNED INTERVENTIONS: 97168 OT Re-evaluation, 97535 self care/ADL training, 02889 therapeutic exercise, 97530 therapeutic activity, 97112 neuromuscular re-education, 97140 manual therapy, 97035 ultrasound, 97018 paraffin, 02960 fluidotherapy, 97010 moist heat, 97010 cryotherapy, 97032 electrical stimulation (manual), 97750 Physical Performance Testing, 02239 Orthotic Initial, [id number] Prosthetic Initial, [id number] Orthotic/Prosthetic subsequent, scar mobilization, passive range of motion, compression bandaging, Dry needling, energy conservation, coping strategies training, patient/family education, and DME and/or AE instructions  RECOMMENDED OTHER SERVICES: None now  CONSULTED AND AGREED WITH PLAN OF CARE: Patient  PLAN FOR NEXT SESSION:  Progress per [facility] Arthroplasty protocol as able: begin with fluidotherapy - continue A/ROM and P/ROM, Pulsed US , begin strengthening 08/18/24 or after [place]  Hand Protocol volume 1 (pages 35-38) [place]  Hand Protocol treatment volume 3 (pages 263-268)  Burnard JINNY Roads, OTR/L 08/02/2024, 1:59 PM

## 2024-08-04 ENCOUNTER — Other Ambulatory Visit: Payer: PRIVATE HEALTH INSURANCE

## 2024-08-05 LAB — T4, FREE: Free T4: 1.3 ng/dL (ref 0.8–1.8)

## 2024-08-05 LAB — TSH: TSH: 4.01 m[IU]/L

## 2024-08-09 ENCOUNTER — Ambulatory Visit: Admitting: Occupational Therapy

## 2024-08-14 ENCOUNTER — Encounter: Payer: Self-pay | Admitting: Orthopedic Surgery

## 2024-08-16 ENCOUNTER — Ambulatory Visit: Payer: PRIVATE HEALTH INSURANCE | Admitting: Occupational Therapy

## 2024-08-16 DIAGNOSIS — M25541 Pain in joints of right hand: Secondary | ICD-10-CM

## 2024-08-16 DIAGNOSIS — R600 Localized edema: Secondary | ICD-10-CM

## 2024-08-16 DIAGNOSIS — M6281 Muscle weakness (generalized): Secondary | ICD-10-CM

## 2024-08-16 DIAGNOSIS — L905 Scar conditions and fibrosis of skin: Secondary | ICD-10-CM

## 2024-08-16 DIAGNOSIS — R29898 Other symptoms and signs involving the musculoskeletal system: Secondary | ICD-10-CM

## 2024-08-16 DIAGNOSIS — M25642 Stiffness of left hand, not elsewhere classified: Secondary | ICD-10-CM

## 2024-08-16 DIAGNOSIS — R278 Other lack of coordination: Secondary | ICD-10-CM

## 2024-08-16 DIAGNOSIS — M25531 Pain in right wrist: Secondary | ICD-10-CM

## 2024-08-16 DIAGNOSIS — M25631 Stiffness of right wrist, not elsewhere classified: Secondary | ICD-10-CM

## 2024-08-16 NOTE — Therapy (Signed)
 " OUTPATIENT OCCUPATIONAL THERAPY ORTHO TREATMENT  Patient Name: Rhonda [name] MRN: [id number] DOB:11/25/1970, 53 y.o., female Today's Date: 08/16/2024  PCP: Theophilus Delma Krabbe, MD  REFERRING PROVIDER: Arlinda Buster, MD  END OF SESSION:  OT End of Session - 08/16/24 1458     Visit Number 6    Number of Visits 21    Date for Recertification  09/22/24    Authorization Type [employer]    OT Start Time 1457    OT Stop Time 1551    OT Time Calculation (min) 54 min    Activity Tolerance Patient tolerated treatment well    Behavior During Therapy WFL for tasks assessed/performed         Past Medical History:  Diagnosis Date   Allergy    Anxiety    C1q nephropathy    Cancer (HCC)    history of thyroid  cancer   Chronic constipation    CKD (chronic kidney disease), stage II nephrologist-  dr dominique pipe [facility])   secondary to C1q nephropathy/  hypertension   Family history of adverse reaction to anesthesia    father hard to put down for anesthesia   GERD (gastroesophageal reflux disease)    H/O radioactive iodine thyroid  ablation    1992  right throid   History of exercise intolerance    01-052011  ETT--  normal w/ no ischemia per cardiologist (dr rolan) epic note   History of pulmonary embolus (PE)    07/ 2008-- treated w/ coumadin   History of syncope    recurrent --  workup done per epic -- dx orthostatic hypotension   Hyperlipidemia    Hypertension    IBS (irritable bowel syndrome)    Neuromuscular disorder (HCC)    carpal tunnel   Pelvic pain in female    Post-surgical hypothyroidism    1991 left thyroidectomy   Pre-diabetes    Proteinuria    Seasonal and perennial allergic rhinitis    Wears contact lenses    Wears glasses    Past Surgical History:  Procedure Laterality Date   58 HOUR PH STUDY N/A 11/16/2016   Procedure: 24 HOUR PH STUDY;  Surgeon: Victory LITTIE Legrand DOUGLAS, MD;  Location: [facility];  Service:  Gastroenterology;  Laterality: N/A;   ABDOMINAL HYSTERECTOMY  1998   CARPAL TUNNEL RELEASE Right 06/23/2024   Procedure: CARPAL TUNNEL RELEASE;  Surgeon: Arlinda Buster, MD;  Location: [facility];  Service: Orthopedics;  Laterality: Right;   COLONOSCOPY     COLONOSCOPY WITH PROPOFOL  N/A 08/05/2021   Procedure: COLONOSCOPY WITH PROPOFOL ;  Surgeon: Federico Rosario BROCKS, MD;  Location: [facility];  Service: Gastroenterology;  Laterality: N/A;   CYSTOSCOPY N/A 04/28/2016   Procedure: CYSTOSCOPY;  Surgeon: Norleen Skill, MD;  Location: [facility];  Service: Gynecology;  Laterality: N/A;   ESOPHAGEAL MANOMETRY N/A 11/16/2016   Procedure: ESOPHAGEAL MANOMETRY (EM);  Surgeon: Victory LITTIE Legrand DOUGLAS, MD;  Location: [facility];  Service: Gastroenterology;  Laterality: N/A;   ESOPHAGOGASTRODUODENOSCOPY     FINGER ARTHROPLASTY Right 06/23/2024   Procedure: ARTHROPLASTY, FINGER;  Surgeon: Arlinda Buster, MD;  Location: [facility];  Service: Orthopedics;  Laterality: Right;  RIGHT THUMB CARPOMATACARPAL ARTHROPLASTY WITH INTERNAL BRACE/ RIGHT OPEN CARPAL TUNNEL RELEASE   LAPAROSCOPIC CHOLECYSTECTOMY  1997   LAPAROSCOPY N/A 03/26/2016   Procedure: LAPAROSCOPY DIAGNOSTIC, LYSIS OF ADHESIONS;  Surgeon: Norleen Skill, MD;  Location: [facility];  Service: Gynecology;  Laterality: N/A;   LAPAROTOMY N/A 04/28/2016  Procedure: EXPLORATORY LAPAROTOMY;  Surgeon: Norleen Skill, MD;  Location: [facility];  Service: Gynecology;  Laterality: N/A;   RENAL BIOPSY     SALPINGOOPHORECTOMY Bilateral 04/28/2016   Procedure: Bilateral OOPHORECTOMY;  Surgeon: Norleen Skill, MD;  Location: [facility];  Service: Gynecology;  Laterality: Bilateral;   THYROIDECTOMY Left 1991   TRANSTHORACIC ECHOCARDIOGRAM  04/27/2012   normal LV, ef 60-65%/  trivial MR   TUBAL LIGATION  1992   Patient Active Problem List   Diagnosis Date Noted   Arthritis of carpometacarpal [facility]) joint of right thumb 06/23/2024   Chronic  pain of right wrist 08/16/2023   Hypomagnesemia 10/06/2021   Hypercalcemia 10/06/2021   Colon cancer screening    Carpal tunnel syndrome, right upper limb 01/07/2021   Flexor carpi radialis tendinitis 01/07/2021   Vitamin D  deficiency 05/19/2019   IGT (impaired glucose tolerance) 05/19/2019   Exposure to medical diagnostic radiation 02/09/2017   S/P bilateral salpingo-oophorectomy 04/28/2016   Pelvic adhesions 03/26/2016    Class: Present on Admission   History of pulmonary embolism 08/01/2014   CKD (chronic kidney disease) stage 3, GFR 30-59 ml/min (HCC) 02/27/2014   Hypothyroidism 04/28/2012   Hemorrhage of rectum and anus 02/09/2011   Constipation 02/09/2011   Dyslipidemia 08/26/2009   Backache 02/14/2008   Allergic rhinitis 11/15/2007   GERD 06/07/2007   Anxiety state 04/27/2007   Essential hypertension 04/27/2007   Disorder of kidney and ureter 03/17/2007   EMBOLISM/THROMBOSIS, DEEP VSL PRXML LWR EXTRM 03/16/2007    ONSET DATE: 05/25/2024 (Date of referral); DOS: 06/23/2024  REFERRING DIAG:  M18.11 (ICD-10-CM) - Arthritis of carpometacarpal (CMC) joint of right thumb  G56.01 (ICD-10-CM) - Carpal tunnel syndrome, right upper limb   THERAPY DIAG:  Stiffness of right wrist, not elsewhere classified  Other symptoms and signs involving the musculoskeletal system  Stiffness of left hand, not elsewhere classified  Localized edema  Pain in joint of right hand  Muscle weakness (generalized)  Other lack of coordination  Scar condition and fibrosis of skin  Pain in right wrist  Rationale for Evaluation and Treatment: Rehabilitation  SUBJECTIVE:   SUBJECTIVE STATEMENT:  She has more pain when the weather outside is cool.  Pt accompanied by: Self  PERTINENT HISTORY: Pt was seen in 2024 and  2025 by OP OT to address Rt hand/wrist/thumb pain the underwent right thumb carpometacarpal arthroplasty with tendon transfer flexor carpi radialis to abductor pollicis longus.  Right partial trapezoidectomy. Right wrist first extensor compartment release. Right open carpal tunnel release.  on 06/23/2024 after conservative measures were unsuccessful.   PRECAUTIONS: per MD 07/31/24: She is doing well postoperatively.  She can convert her wrist splint to a hand-based thumb spica orthosis at this point as well.  Continue with range of motion exercises for the next 2 weeks, progressive strengthening at week 8 per protocol.   As per [place]  Hand Protocol for post op Right [facility] arthroplasty and pertinent history/MD notes above.  WEIGHT BEARING RESTRICTIONS:Yes , NWB, No functional use right hand/wrist/thumb  PAIN:  Are you having pain? Yes: NPRS scale: 5/10 in Rt wrist and fingers Pain location: Rt palm, areas of incision  Pain description: numbin, burning tingling; spasms are more painful Aggravating factors: sleeping on back Relieving factors: Rest and heat   FALLS: Has patient fallen in last 6 months? Yes. Number of falls 1 - 8/21 while assisting a pt, pt had muscle spasms in back as a result  PLOF: Independent  PATIENT GOALS: To improve use of RUE  NEXT MD VISIT:   OBJECTIVE: (All objective assessments below are from initial evaluation on: 07/06/2024 unless otherwise specified.)   HAND DOMINANCE: Right   ADLs: Overall ADLs: mostly mod I though requires occasional assistance from husband for fasteners.   FUNCTIONAL OUTCOME MEASURES: QuickDASH: 88.6 % with use of RUE   UPPER EXTREMITY ROM:     AROM Right (eval) Left (eval)  Shoulder flexion WNL WNL  Shoulder abduction  WNL  Elbow flexion WFL WNL  Elbow extension WFL with increased pain WNL  Wrist flexion  WNL  Wrist extension  WNL  Wrist pronation  WNL  Wrist supination  WNL   Digit Composite Flexion  WNL  Digit Composite Extension  WNL  Digit Opposition  WNL  (Blank rows = not tested)  HAND FUNCTION: Grip strength: Right: Deferred lbs; Left: NT lbs  Lateral pinch: Right: Deferred lbs, Left:  NT lbs  3 point pinch: Right: Deferred lbs, Left: NT lbs   COORDINATION: 9 Hole Peg test: Right: Deferred sec; Left: NT sec  SENSATION: Eval:  severe paresthesias reported through RUE  EDEMA:   Eval: mild to moderate edema appreciated  COGNITION: Eval: Overall cognitive status: WFL for evaluation today   OBSERVATIONS:   Pt ambulates without use of AD. No loss of balance. The pt appears well kept and has arm sling donned to RUE. Incisions scabbed over following suture removal. Pt holds RUE in guarded positioning including palm.  TODAY'S TREATMENT:  - Self-care/home management completed for duration as noted below including: OT provided education with respect to a portable form of heat that she can travel with. OT recommended rechargeable hand warmers.  OT educated and provided pt with silicone scar pads to be worn inside splint and can be secured with stockinet for improved scar management.  OT educated pt on use of tape for edema and pain relief Kinesio tape to dorsum of R wrist using space correction technique with 40-60% stretch. Pt verbalized understanding of tape and wearing schedule with application on affected extremity.  OT educated patient on how to obtain Kinesiotape or similar tape for home completion.  - Therapeutic exercises completed for duration as noted below including:  Pt placed BUE in Fluidotherapy machine with supervised ROM x 10 min. Pt was educated to complete PROM during modality time to improve ROM and decrease pain/stiffness of affected extremity by use of the machine's massaging action and thermal properties.   - Manual therapy completed for duration as noted below including:  Performed dynamic cupping at site of incision and surrounding area to decrease scar tissue adhesions by mobilizing the soft- tissues such as skin, fascia, neural tissues, muscles, ligaments and tendons and to promote local circulation necessary for optimal functional movement by lifting and  separating the tissue underneath the cup. Improved appearance to incision noted upon completion with less palpable adhesions.  - Ultrasound completed for duration as noted below including:  Ultrasound applied to palmar and dorsal right hand, digits, and wrist for 8 minutes, frequency of 3 MHz, 20% duty cycle, and 1.1 W/cm with pt's arm placed on soft towel for promotion of ROM, edema reduction, and pain reduction in affected extremity.  PATIENT EDUCATION: Education details: see above Person educated: Patient Education method: Engineer, Structural, Teach back, Education comprehension: States and demonstrates understanding, Additional Education required   HOME EXERCISE PROGRAM: 07/06/2024: R digit and wrist ROM (excluding thumb); contrast baths; heat  GOALS: SHORT TERM GOALS:   MET 2/2  LONG TERM GOALS: Target Date: 09/22/2024  Patient will demonstrate at least 16% improvement with quick Dash score (reporting 72.6% disability or less) indicating improved functional use of affected extremity.  Baseline: 88.6% disability with use fo RUE.  Goal status: INITIAL   2.  Pt will improve grip strength in right hand from unable to assess to at least 15 lbs for functional use at home and in IADLs. Goal status: INITIAL   3.  Pt will improve A/ROM in right thumb from opposition to tip of index finger on right to at least opposition to PIP (or greater) of right small finger, to have functional motion for tasks like ADLs, reach, and grasp.  Goal status: INITIAL   4.  Pt will improve right hand functional use from unable to assess to at least Mod I for basic ADLs (ie, brushing her hair, bathing, brushing her teeth and folding laundry) to assist in ability to carry out selfcare and higher-level homecare tasks with less difficulty. Goal status: INITIAL   5.  Pt will improve coordination skills in right hand, as seen by better score on 9 hole peg testing to have increased functional ability to carry out  fine motor tasks (fasteners, etc.) and more complex, coordinated IADLs (meal prep, sports, etc.).  Goal status: INITIAL/TBD   6.  Pt will decrease pain at worst from 7/10 to 2/10 or less to have better sleep and occupational participation in daily roles. Goal status: IN Progress  ASSESSMENT:  CLINICAL IMPRESSION: Patient is a 53 y.o. female who was seen today for occupational therapy treatment for R [facility] arthroplasty and CTR 06/23/2024.  Pt saw MD on 07/31/24 and he cleared her to reduce down to hand based thumb splint and can begin strengthening at 8 weeks post-op. Pt benefits from review of HEP's and recommendations, and is demo increased ROM of wrist, digits, and thumb.  PERFORMANCE DEFICITS: in functional skills including ADLs, IADLs, coordination, ROM, strength, pain, fascial restrictions, muscle spasms, flexibility, Gross motor control, body mechanics, endurance, decreased knowledge of precautions, and UE functional use, cognitive skills including problem solving, and psychosocial skills including coping strategies, environmental adaptation, and habits.   IMPAIRMENTS: are limiting patient from ADLs, IADLs, rest and sleep, work, and leisure.   COMORBIDITIES: may have co-morbidities  that affects occupational performance. Patient will benefit from skilled OT to address above impairments and improve overall function.  REHAB POTENTIAL: Excellent  PLAN:  OT FREQUENCY: 1-2x/week  OT DURATION: up to 10 weeks   PLANNED INTERVENTIONS: 97168 OT Re-evaluation, 97535 self care/ADL training, 02889 therapeutic exercise, 97530 therapeutic activity, 97112 neuromuscular re-education, 97140 manual therapy, 97035 ultrasound, 97018 paraffin, 02960 fluidotherapy, 97010 moist heat, 97010 cryotherapy, 97032 electrical stimulation (manual), 97750 Physical Performance Testing, 02239 Orthotic Initial, [id number] Prosthetic Initial, [id number] Orthotic/Prosthetic subsequent, scar mobilization, passive range of motion,  compression bandaging, Dry needling, energy conservation, coping strategies training, patient/family education, and DME and/or AE instructions  RECOMMENDED OTHER SERVICES: None now  CONSULTED AND AGREED WITH PLAN OF CARE: Patient  PLAN FOR NEXT SESSION:  Progress per [facility] Arthroplasty protocol as able: begin with fluidotherapy - continue A/ROM and P/ROM, Pulsed US , begin strengthening 08/18/24 or after [place]  Hand Protocol volume 1 (pages 35-38) [place]  Hand Protocol treatment volume 3 (pages 263-268)  Jocelyn CHRISTELLA Bottom, OTR/L 08/16/2024, 4:05 PM   "

## 2024-08-23 ENCOUNTER — Ambulatory Visit: Payer: PRIVATE HEALTH INSURANCE | Attending: Orthopedic Surgery | Admitting: Occupational Therapy

## 2024-08-23 DIAGNOSIS — R29898 Other symptoms and signs involving the musculoskeletal system: Secondary | ICD-10-CM | POA: Insufficient documentation

## 2024-08-23 DIAGNOSIS — M25642 Stiffness of left hand, not elsewhere classified: Secondary | ICD-10-CM | POA: Insufficient documentation

## 2024-08-23 DIAGNOSIS — L905 Scar conditions and fibrosis of skin: Secondary | ICD-10-CM | POA: Insufficient documentation

## 2024-08-23 DIAGNOSIS — M25541 Pain in joints of right hand: Secondary | ICD-10-CM | POA: Diagnosis present

## 2024-08-23 DIAGNOSIS — M6281 Muscle weakness (generalized): Secondary | ICD-10-CM | POA: Diagnosis present

## 2024-08-23 DIAGNOSIS — R278 Other lack of coordination: Secondary | ICD-10-CM | POA: Diagnosis present

## 2024-08-23 DIAGNOSIS — M25531 Pain in right wrist: Secondary | ICD-10-CM | POA: Diagnosis present

## 2024-08-23 DIAGNOSIS — R600 Localized edema: Secondary | ICD-10-CM | POA: Insufficient documentation

## 2024-08-23 DIAGNOSIS — M25631 Stiffness of right wrist, not elsewhere classified: Secondary | ICD-10-CM | POA: Diagnosis present

## 2024-08-23 DIAGNOSIS — M25532 Pain in left wrist: Secondary | ICD-10-CM | POA: Diagnosis present

## 2024-08-23 NOTE — Therapy (Signed)
 " OUTPATIENT OCCUPATIONAL THERAPY ORTHO TREATMENT  Patient Name: Rhonda [name] MRN: [id number] DOB:October 31, 1970, 54 y.o., female Today's Date: 08/23/2024  PCP: Theophilus Delma Krabbe, MD  REFERRING PROVIDER: Arlinda Buster, MD  END OF SESSION:  OT End of Session - 08/23/24 1405     Visit Number 7    Number of Visits 21    Date for Recertification  09/22/24    Authorization Type BC[employer]    OT Start Time 1405    OT Stop Time 1445    OT Time Calculation (min) 40 min    Activity Tolerance Patient tolerated treatment well    Behavior During Therapy WFL for tasks assessed/performed         Past Medical History:  Diagnosis Date   Allergy    Anxiety    C1q nephropathy    Cancer (HCC)    history of thyroid  cancer   Chronic constipation    CKD (chronic kidney disease), stage II nephrologist-  dr dominique pipe [facility])   secondary to C1q nephropathy/  hypertension   Family history of adverse reaction to anesthesia    father hard to put down for anesthesia   GERD (gastroesophageal reflux disease)    H/O radioactive iodine thyroid  ablation    1992  right throid   History of exercise intolerance    01-052011  ETT--  normal w/ no ischemia per cardiologist (dr rolan) epic note   History of pulmonary embolus (PE)    07/ 2008-- treated w/ coumadin   History of syncope    recurrent --  workup done per epic -- dx orthostatic hypotension   Hyperlipidemia    Hypertension    IBS (irritable bowel syndrome)    Neuromuscular disorder (HCC)    carpal tunnel   Pelvic pain in female    Post-surgical hypothyroidism    1991 left thyroidectomy   Pre-diabetes    Proteinuria    Seasonal and perennial allergic rhinitis    Wears contact lenses    Wears glasses    Past Surgical History:  Procedure Laterality Date   72 HOUR PH STUDY N/A 11/16/2016   Procedure: 24 HOUR PH STUDY;  Surgeon: Victory LITTIE Legrand DOUGLAS, MD;  Location: [facility];  Service:  Gastroenterology;  Laterality: N/A;   ABDOMINAL HYSTERECTOMY  1998   CARPAL TUNNEL RELEASE Right 06/23/2024   Procedure: CARPAL TUNNEL RELEASE;  Surgeon: Arlinda Buster, MD;  Location: [facility];  Service: Orthopedics;  Laterality: Right;   COLONOSCOPY     COLONOSCOPY WITH PROPOFOL  N/A 08/05/2021   Procedure: COLONOSCOPY WITH PROPOFOL ;  Surgeon: Federico Rosario BROCKS, MD;  Location: [facility];  Service: Gastroenterology;  Laterality: N/A;   CYSTOSCOPY N/A 04/28/2016   Procedure: CYSTOSCOPY;  Surgeon: Norleen Skill, MD;  Location: [facility];  Service: Gynecology;  Laterality: N/A;   ESOPHAGEAL MANOMETRY N/A 11/16/2016   Procedure: ESOPHAGEAL MANOMETRY (EM);  Surgeon: Victory LITTIE Legrand DOUGLAS, MD;  Location: [facility];  Service: Gastroenterology;  Laterality: N/A;   ESOPHAGOGASTRODUODENOSCOPY     FINGER ARTHROPLASTY Right 06/23/2024   Procedure: ARTHROPLASTY, FINGER;  Surgeon: Arlinda Buster, MD;  Location: [facility];  Service: Orthopedics;  Laterality: Right;  RIGHT THUMB CARPOMATACARPAL ARTHROPLASTY WITH INTERNAL BRACE/ RIGHT OPEN CARPAL TUNNEL RELEASE   LAPAROSCOPIC CHOLECYSTECTOMY  1997   LAPAROSCOPY N/A 03/26/2016   Procedure: LAPAROSCOPY DIAGNOSTIC, LYSIS OF ADHESIONS;  Surgeon: Norleen Skill, MD;  Location: [facility];  Service: Gynecology;  Laterality: N/A;   LAPAROTOMY N/A 04/28/2016  Procedure: EXPLORATORY LAPAROTOMY;  Surgeon: Norleen Skill, MD;  Location: [facility];  Service: Gynecology;  Laterality: N/A;   RENAL BIOPSY     SALPINGOOPHORECTOMY Bilateral 04/28/2016   Procedure: Bilateral OOPHORECTOMY;  Surgeon: Norleen Skill, MD;  Location: [facility];  Service: Gynecology;  Laterality: Bilateral;   THYROIDECTOMY Left 1991   TRANSTHORACIC ECHOCARDIOGRAM  04/27/2012   normal LV, ef 60-65%/  trivial MR   TUBAL LIGATION  1992   Patient Active Problem List   Diagnosis Date Noted   Arthritis of carpometacarpal [facility]) joint of right thumb 06/23/2024   Chronic  pain of right wrist 08/16/2023   Hypomagnesemia 10/06/2021   Hypercalcemia 10/06/2021   Colon cancer screening    Carpal tunnel syndrome, right upper limb 01/07/2021   Flexor carpi radialis tendinitis 01/07/2021   Vitamin D  deficiency 05/19/2019   IGT (impaired glucose tolerance) 05/19/2019   Exposure to medical diagnostic radiation 02/09/2017   S/P bilateral salpingo-oophorectomy 04/28/2016   Pelvic adhesions 03/26/2016    Class: Present on Admission   History of pulmonary embolism 08/01/2014   CKD (chronic kidney disease) stage 3, GFR 30-59 ml/min (HCC) 02/27/2014   Hypothyroidism 04/28/2012   Hemorrhage of rectum and anus 02/09/2011   Constipation 02/09/2011   Dyslipidemia 08/26/2009   Backache 02/14/2008   Allergic rhinitis 11/15/2007   GERD 06/07/2007   Anxiety state 04/27/2007   Essential hypertension 04/27/2007   Disorder of kidney and ureter 03/17/2007   EMBOLISM/THROMBOSIS, DEEP VSL PRXML LWR EXTRM 03/16/2007    ONSET DATE: 05/25/2024 (Date of referral); DOS: 06/23/2024  REFERRING DIAG:  M18.11 (ICD-10-CM) - Arthritis of carpometacarpal (CMC) joint of right thumb  G56.01 (ICD-10-CM) - Carpal tunnel syndrome, right upper limb   THERAPY DIAG:  Stiffness of right wrist, not elsewhere classified  Other symptoms and signs involving the musculoskeletal system  Localized edema  Pain in joint of right hand  Muscle weakness (generalized)  Other lack of coordination  Scar condition and fibrosis of skin  Pain in right wrist  Rationale for Evaluation and Treatment: Rehabilitation  SUBJECTIVE:   SUBJECTIVE STATEMENT:  She reports she took off the taping last time as it was irritating over her thumb incision. She was able to get a 2 pack of the rechargeable hand warmers.   Pt accompanied by: Self  PERTINENT HISTORY: Pt was seen in 2024 and  2025 by OP OT to address Rt hand/wrist/thumb pain the underwent right thumb carpometacarpal arthroplasty with tendon  transfer flexor carpi radialis to abductor pollicis longus. Right partial trapezoidectomy. Right wrist first extensor compartment release. Right open carpal tunnel release.  on 06/23/2024 after conservative measures were unsuccessful.   PRECAUTIONS: per MD 07/31/24: She is doing well postoperatively.  She can convert her wrist splint to a hand-based thumb spica orthosis at this point as well.  Continue with range of motion exercises for the next 2 weeks, progressive strengthening at week 8 per protocol.   As per [place]  Hand Protocol for post op Right [facility] arthroplasty and pertinent history/MD notes above.  WEIGHT BEARING RESTRICTIONS:Yes , NWB, No functional use right hand/wrist/thumb  PAIN:  Are you having pain? Yes: NPRS scale: 5/10 in Rt wrist and fingers Pain location: Rt palm, areas of incision  Pain description: numbin, burning tingling; spasms are more painful Aggravating factors: sleeping on back Relieving factors: Rest and heat   FALLS: Has patient fallen in last 6 months? Yes. Number of falls 1 - 8/21 while assisting a pt, pt had muscle spasms in back as a  result  PLOF: Independent  PATIENT GOALS: To improve use of RUE  NEXT MD VISIT:   OBJECTIVE: (All objective assessments below are from initial evaluation on: 07/06/2024 unless otherwise specified.)   HAND DOMINANCE: Right   ADLs: Overall ADLs: mostly mod I though requires occasional assistance from husband for fasteners.   FUNCTIONAL OUTCOME MEASURES: QuickDASH: 88.6 % with use of RUE   UPPER EXTREMITY ROM:     AROM Right (eval) Left (eval)  Shoulder flexion WNL WNL  Shoulder abduction  WNL  Elbow flexion WFL WNL  Elbow extension WFL with increased pain WNL  Wrist flexion  WNL  Wrist extension  WNL  Wrist pronation  WNL  Wrist supination  WNL   Digit Composite Flexion  WNL  Digit Composite Extension  WNL  Digit Opposition  WNL  (Blank rows = not tested)  HAND FUNCTION: Grip strength: Right: Deferred  lbs; Left: NT lbs  Lateral pinch: Right: Deferred lbs, Left: NT lbs  3 point pinch: Right: Deferred lbs, Left: NT lbs   COORDINATION: 9 Hole Peg test: Right: Deferred sec; Left: NT sec  SENSATION: Eval:  severe paresthesias reported through RUE  EDEMA:   Eval: mild to moderate edema appreciated  COGNITION: Eval: Overall cognitive status: WFL for evaluation today   OBSERVATIONS:   Pt ambulates without use of AD. No loss of balance. The pt appears well kept and has arm sling donned to RUE. Incisions scabbed over following suture removal. Pt holds RUE in guarded positioning including palm.  TODAY'S TREATMENT:  - Self-care/home management completed for duration as noted below including: OT reviewed use of tape for edema and pain relief and educated on use for scar management. Kinesio tape to dorsum of R wrist using space correction technique with 40-60% stretch. Additional strips applied over incisions scar using fascial correction technique with gentle oscillatory and shear forces along the scar lines to facilitate scar mobility, improve tissue extensibility, and decrease adherence of underlying soft tissue. Pt verbalized understanding of tape and wearing schedule with application on affected extremity.  OT educated patient on how to obtain Kinesiotape or similar tape for home completion.   - Ultrasound completed for duration as noted below including:  Ultrasound applied to palmar and dorsal right hand, digits, and wrist for 8 minutes, frequency of 3 MHz, 20% duty cycle, and 1.1 W/cm with pt's arm placed on soft towel for promotion of ROM, edema reduction, and pain reduction in affected extremity. - Therapeutic exercises completed for duration as noted below including: OT initiated: - Seated Finger Composite Flexion with Putty  - 1 x daily - 10 reps - Finger Pinch and Pull with Putty  - 1 x daily - 10 reps - Rolling Putty on Table  - 1 x daily - 10 reps - Tendon Gliding Fist Series:  Neutral Hand Position Progression (Mirrored)  - 1 x daily - 10 reps - Wrist extension and supination stretch  - 1 x daily - 10 reps - Wrist Prayer Stretch at Table  - 1 x daily - 5 reps - 10+ seconds hold  PATIENT EDUCATION: Education details: see above Person educated: Patient Education method: Engineer, Structural, demonstration, Teach back, handouts Education comprehension: States and demonstrates understanding, Additional Education required   HOME EXERCISE PROGRAM: 07/06/2024: R digit and wrist ROM (excluding thumb); contrast baths; heat 08/23/2024: yellow putty and wrist stretches  GOALS: SHORT TERM GOALS:   MET 2/2  LONG TERM GOALS: Target Date: 09/22/2024  Patient will demonstrate at least 16%  improvement with quick Dash score (reporting 72.6% disability or less) indicating improved functional use of affected extremity.  Baseline: 88.6% disability with use fo RUE.  Goal status: INITIAL   2.  Pt will improve grip strength in right hand from unable to assess to at least 15 lbs for functional use at home and in IADLs. Goal status: INITIAL   3.  Pt will improve A/ROM in right thumb from opposition to tip of index finger on right to at least opposition to PIP (or greater) of right small finger, to have functional motion for tasks like ADLs, reach, and grasp.  Goal status: INITIAL   4.  Pt will improve right hand functional use from unable to assess to at least Mod I for basic ADLs (ie, brushing her hair, bathing, brushing her teeth and folding laundry) to assist in ability to carry out selfcare and higher-level homecare tasks with less difficulty. Goal status: INITIAL   5.  Pt will improve coordination skills in right hand, as seen by better score on 9 hole peg testing to have increased functional ability to carry out fine motor tasks (fasteners, etc.) and more complex, coordinated IADLs (meal prep, sports, etc.).  Goal status: INITIAL/TBD   6.  Pt will decrease pain at worst from 7/10  to 2/10 or less to have better sleep and occupational participation in daily roles. Goal status: IN Progress  ASSESSMENT:  CLINICAL IMPRESSION: Patient is a 54 y.o. female who was seen today for occupational therapy treatment for [facility] arthroplasty and CTR 06/23/2024.  Good understanding of putty HEP and taping for edema relief and scar management. Progress towards further strengthening and remaining goals as tolerated.   PERFORMANCE DEFICITS: in functional skills including ADLs, IADLs, coordination, ROM, strength, pain, fascial restrictions, muscle spasms, flexibility, Gross motor control, body mechanics, endurance, decreased knowledge of precautions, and UE functional use, cognitive skills including problem solving, and psychosocial skills including coping strategies, environmental adaptation, and habits.   IMPAIRMENTS: are limiting patient from ADLs, IADLs, rest and sleep, work, and leisure.   COMORBIDITIES: may have co-morbidities  that affects occupational performance. Patient will benefit from skilled OT to address above impairments and improve overall function.  REHAB POTENTIAL: Excellent  PLAN:  OT FREQUENCY: 1-2x/week  OT DURATION: up to 10 weeks   PLANNED INTERVENTIONS: 97168 OT Re-evaluation, 97535 self care/ADL training, 02889 therapeutic exercise, 97530 therapeutic activity, 97112 neuromuscular re-education, 97140 manual therapy, 97035 ultrasound, 97018 paraffin, 02960 fluidotherapy, 97010 moist heat, 97010 cryotherapy, 97032 electrical stimulation (manual), 97750 Physical Performance Testing, 02239 Orthotic Initial, [id number] Prosthetic Initial, [id number] Orthotic/Prosthetic subsequent, scar mobilization, passive range of motion, compression bandaging, Dry needling, energy conservation, coping strategies training, patient/family education, and DME and/or AE instructions  RECOMMENDED OTHER SERVICES: None now  CONSULTED AND AGREED WITH PLAN OF CARE: Patient  PLAN FOR NEXT SESSION:   Review putty HEP How was tape?  [place]  Hand Protocol volume 1 (pages 35-38) [place]  Hand Protocol treatment volume 3 (pages 263-268)  Jocelyn CHRISTELLA Bottom, OTR/L 08/23/2024, 2:25 PM   "

## 2024-08-23 NOTE — Patient Instructions (Addendum)
" °  Access Code: [email or web address] URL: [email or web address] "

## 2024-08-30 ENCOUNTER — Ambulatory Visit: Payer: PRIVATE HEALTH INSURANCE | Admitting: Occupational Therapy

## 2024-08-30 ENCOUNTER — Encounter: Payer: Self-pay | Admitting: Internal Medicine

## 2024-08-30 DIAGNOSIS — R600 Localized edema: Secondary | ICD-10-CM

## 2024-08-30 DIAGNOSIS — M6281 Muscle weakness (generalized): Secondary | ICD-10-CM

## 2024-08-30 DIAGNOSIS — M25531 Pain in right wrist: Secondary | ICD-10-CM

## 2024-08-30 DIAGNOSIS — R278 Other lack of coordination: Secondary | ICD-10-CM

## 2024-08-30 DIAGNOSIS — M25631 Stiffness of right wrist, not elsewhere classified: Secondary | ICD-10-CM

## 2024-08-30 DIAGNOSIS — R29898 Other symptoms and signs involving the musculoskeletal system: Secondary | ICD-10-CM

## 2024-08-30 DIAGNOSIS — L905 Scar conditions and fibrosis of skin: Secondary | ICD-10-CM

## 2024-08-30 DIAGNOSIS — M25642 Stiffness of left hand, not elsewhere classified: Secondary | ICD-10-CM

## 2024-08-30 DIAGNOSIS — M25532 Pain in left wrist: Secondary | ICD-10-CM

## 2024-08-30 DIAGNOSIS — M25541 Pain in joints of right hand: Secondary | ICD-10-CM

## 2024-08-30 MED ORDER — SCOPOLAMINE 1 MG/3DAYS TD PT72: 1.0000 | MEDICATED_PATCH | TRANSDERMAL | 12 refills | Status: AC

## 2024-08-30 NOTE — Therapy (Signed)
 " OUTPATIENT OCCUPATIONAL THERAPY ORTHO TREATMENT  Patient Name: Rhonda [name] MRN: [id number] DOB:1970/12/26, 54 y.o., female Today's Date: 08/30/2024  PCP: Theophilus Delma Krabbe, MD  REFERRING PROVIDER: Arlinda Buster, MD  END OF SESSION:  OT End of Session - 08/30/24 1405     Visit Number 8    Number of Visits 21    Date for Recertification  09/22/24    Authorization Type BCBS    OT Start Time 1405    OT Stop Time 1451    OT Time Calculation (min) 46 min    Activity Tolerance Patient tolerated treatment well    Behavior During Therapy WFL for tasks assessed/performed         Past Medical History:  Diagnosis Date   Allergy    Anxiety    C1q nephropathy    Cancer (HCC)    history of thyroid  cancer   Chronic constipation    CKD (chronic kidney disease), stage II nephrologist-  dr dominique pipe [facility])   secondary to C1q nephropathy/  hypertension   Family history of adverse reaction to anesthesia    father hard to put down for anesthesia   GERD (gastroesophageal reflux disease)    H/O radioactive iodine thyroid  ablation    1992  right throid   History of exercise intolerance    01-052011  ETT--  normal w/ no ischemia per cardiologist (dr rolan) epic note   History of pulmonary embolus (PE)    07/ 2008-- treated w/ coumadin   History of syncope    recurrent --  workup done per epic -- dx orthostatic hypotension   Hyperlipidemia    Hypertension    IBS (irritable bowel syndrome)    Neuromuscular disorder (HCC)    carpal tunnel   Pelvic pain in female    Post-surgical hypothyroidism    1991 left thyroidectomy   Pre-diabetes    Proteinuria    Seasonal and perennial allergic rhinitis    Wears contact lenses    Wears glasses    Past Surgical History:  Procedure Laterality Date   108 HOUR PH STUDY N/A 11/16/2016   Procedure: 24 HOUR PH STUDY;  Surgeon: Victory LITTIE Legrand DOUGLAS, MD;  Location: [facility];  Service:  Gastroenterology;  Laterality: N/A;   ABDOMINAL HYSTERECTOMY  1998   CARPAL TUNNEL RELEASE Right 06/23/2024   Procedure: CARPAL TUNNEL RELEASE;  Surgeon: Arlinda Buster, MD;  Location: [facility];  Service: Orthopedics;  Laterality: Right;   COLONOSCOPY     COLONOSCOPY WITH PROPOFOL  N/A 08/05/2021   Procedure: COLONOSCOPY WITH PROPOFOL ;  Surgeon: Federico Rosario BROCKS, MD;  Location: [facility];  Service: Gastroenterology;  Laterality: N/A;   CYSTOSCOPY N/A 04/28/2016   Procedure: CYSTOSCOPY;  Surgeon: Norleen Skill, MD;  Location: [facility];  Service: Gynecology;  Laterality: N/A;   ESOPHAGEAL MANOMETRY N/A 11/16/2016   Procedure: ESOPHAGEAL MANOMETRY (EM);  Surgeon: Victory LITTIE Legrand DOUGLAS, MD;  [facility]: [facility];  Service: Gastroenterology;  Laterality: N/A;   ESOPHAGOGASTRODUODENOSCOPY     FINGER ARTHROPLASTY Right 06/23/2024   Procedure: ARTHROPLASTY, FINGER;  Surgeon: Arlinda Buster, MD;  Location:  S[facility]ERY [facility];  Service: Orthopedics;  Laterality: Right;  RIGHT THUMB CARPOMATACARPAL ARTHROPLASTY WITH INTERNAL BRACE/ RIGHT OPEN CARPAL TUNNEL RELEASE   LAPAROSCOPIC CHOLECYSTECTOMY  1997   LAPAROSCOPY N/A 03/26/2016   Procedure: LAPAROSCOPY DIAGNOSTIC, LYSIS OF ADHESIONS;  Surgeon: Norleen Skill, MD;  Location: [facility];  Service: Gynecology;  Laterality: N/A;   LAPAROTOMY N/A 04/28/2016  Procedure: EXPLORATORY LAPAROTOMY;  Surgeon: Norleen Skill, MD;  Location: [facility];  Service: Gynecology;  Laterality: N/A;   RENAL BIOPSY     SALPINGOOPHORECTOMY Bilateral 04/28/2016   Procedure: Bilateral OOPHORECTOMY;  Surgeon: Norleen Skill, MD;  Location: [facility];  Service: Gynecology;  Laterality: Bilateral;   THYROIDECTOMY Left 1991   TRANSTHORACIC ECHOCARDIOGRAM  04/27/2012   normal LV, ef 60-65%/  trivial MR   TUBAL LIGATION  1992   Patient Active Problem List   Diagnosis Date Noted   Arthritis of carpometacarpal [facility]) joint of right thumb 06/23/2024   Chronic  pain of right wrist 08/16/2023   Hypomagnesemia 10/06/2021   Hypercalcemia 10/06/2021   Colon cancer screening    Carpal tunnel syndrome, right upper limb 01/07/2021   Flexor carpi radialis tendinitis 01/07/2021   Vitamin D  deficiency 05/19/2019   IGT (impaired glucose tolerance) 05/19/2019   Exposure to medical diagnostic radiation 02/09/2017   S/P bilateral salpingo-oophorectomy 04/28/2016   Pelvic adhesions 03/26/2016    Class: Present on Admission   History of pulmonary embolism 08/01/2014   CKD (chronic kidney disease) stage 3, GFR 30-59 ml/min (HCC) 02/27/2014   Hypothyroidism 04/28/2012   Hemorrhage of rectum and anus 02/09/2011   Constipation 02/09/2011   Dyslipidemia 08/26/2009   Backache 02/14/2008   Allergic rhinitis 11/15/2007   GERD 06/07/2007   Anxiety state 04/27/2007   Essential hypertension 04/27/2007   Disorder of kidney and ureter 03/17/2007   EMBOLISM/THROMBOSIS, DEEP VSL PRXML LWR EXTRM 03/16/2007    ONSET DATE: 05/25/2024 (Date of referral); DOS: 06/23/2024  REFERRING DIAG:  M18.11 (ICD-10-CM) - Arthritis of carpometacarpal (CMC) joint of right thumb  G56.01 (ICD-10-CM) - Carpal tunnel syndrome, right upper limb   THERAPY DIAG:  Stiffness of right wrist, not elsewhere classified  Other symptoms and signs involving the musculoskeletal system  Localized edema  Pain in joint of right hand  Muscle weakness (generalized)  Other lack of coordination  Scar condition and fibrosis of skin  Pain in right wrist  Stiffness of left hand, not elsewhere classified  Pain in left wrist  Rationale for Evaluation and Treatment: Rehabilitation  SUBJECTIVE:   SUBJECTIVE STATEMENT:  She reports taping worked well from last session. No recent swelling.  Pt accompanied by: husband  PERTINENT HISTORY: Pt was seen in 2024 and  2025 by OP OT to address Rt hand/wrist/thumb pain the underwent right thumb carpometacarpal arthroplasty with tendon transfer flexor  carpi radialis to abductor pollicis longus. Right partial trapezoidectomy. Right wrist first extensor compartment release. Right open carpal tunnel release.  on 06/23/2024 after conservative measures were unsuccessful.   PRECAUTIONS: per MD 07/31/24: She is doing well postoperatively.  She can convert her wrist splint to a hand-based thumb spica orthosis at this point as well.  Continue with range of motion exercises for the next 2 weeks, progressive strengthening at week 8 per protocol.   As per [place]  Hand Protocol for post op Right [facility] arthroplasty and pertinent history/MD notes above.  WEIGHT BEARING RESTRICTIONS:Yes , NWB, No functional use right hand/wrist/thumb  PAIN:  Are you having pain? Yes: NPRS scale: 5/10 in Rt wrist and fingers Pain location: Rt palm, areas of incision  Pain description: numbin, burning tingling; spasms are more painful Aggravating factors: sleeping on back Relieving factors: Rest and heat   FALLS: Has patient fallen in last 6 months? Yes. Number of falls 1 - 8/21 while assisting a pt, pt had muscle spasms in back as a result  PLOF: Independent  PATIENT GOALS:  To improve use of RUE  NEXT MD VISIT:   OBJECTIVE: (All objective assessments below are from initial evaluation on: 07/06/2024 unless otherwise specified.)   HAND DOMINANCE: Right   ADLs: Overall ADLs: mostly mod I though requires occasional assistance from husband for fasteners.   FUNCTIONAL OUTCOME MEASURES: QuickDASH: 88.6 % with use of RUE   UPPER EXTREMITY ROM:     AROM Right (eval) Left (eval)  Shoulder flexion WNL WNL  Shoulder abduction  WNL  Elbow flexion WFL WNL  Elbow extension WFL with increased pain WNL  Wrist flexion  WNL  Wrist extension  WNL  Wrist pronation  WNL  Wrist supination  WNL   Digit Composite Flexion  WNL  Digit Composite Extension  WNL  Digit Opposition  WNL  (Blank rows = not tested)  HAND FUNCTION: Grip strength: Right: Deferred lbs; Left: NT lbs   Lateral pinch: Right: Deferred lbs, Left: NT lbs  3 point pinch: Right: Deferred lbs, Left: NT lbs   COORDINATION: 9 Hole Peg test: Right: Deferred sec; Left: NT sec  SENSATION: Eval:  severe paresthesias reported through RUE  EDEMA:   Eval: mild to moderate edema appreciated  COGNITION: Eval: Overall cognitive status: WFL for evaluation today   OBSERVATIONS:   Pt ambulates without use of AD. No loss of balance. The pt appears well kept and has arm sling donned to RUE. Incisions scabbed over following suture removal. Pt holds RUE in guarded positioning including palm.  TODAY'S TREATMENT:  - Self-care/home management completed for duration as noted below including: OT reviewed use of phone access for HEP. Browsed ways to beazer homes or text on her phone.  Discussed splint wear and educated pt may slowly start to wean so long as pain does not increase significantly.   - Therapeutic exercises completed for duration as noted below including: OT reviewed: - Seated Finger Composite Flexion with Putty  - 1 x daily - 10 reps - Finger Pinch and Pull with Putty  - 1 x daily - 10 reps - Rolling Putty on Table  - 1 x daily - 10 reps - Tendon Gliding Fist Series: Neutral Hand Position Progression (Mirrored)  - 1 x daily - 10 reps - Wrist extension and supination stretch  - 1 x daily - 10 reps - Wrist Prayer Stretch at Table  - 1 x daily - 5 reps - 10+ seconds hold Pt required min cueing for proper completion.   PATIENT EDUCATION: Education details: see above Person educated: Patient and spouse Education method: Engineer, Structural, demonstration, Teach back Education comprehension: States and demonstrates understanding, Additional Education required   HOME EXERCISE PROGRAM: 07/06/2024: R digit and wrist ROM (excluding thumb); contrast baths; heat 08/23/2024: yellow putty and wrist stretches Access Code: [email or web address]  GOALS: SHORT TERM GOALS:   MET 2/2  LONG TERM GOALS: Target Date:  09/22/2024  Patient will demonstrate at least 16% improvement with quick Dash score (reporting 72.6% disability or less) indicating improved functional use of affected extremity.  Baseline: 88.6% disability with use fo RUE.  Goal status: INITIAL   2.  Pt will improve grip strength in right hand from unable to assess to at least 15 lbs for functional use at home and in IADLs. Goal status: INITIAL   3.  Pt will improve A/ROM in right thumb from opposition to tip of index finger on right to at least opposition to PIP (or greater) of right small finger, to have functional motion for tasks like ADLs, reach, and  grasp.  Goal status: INITIAL   4.  Pt will improve right hand functional use from unable to assess to at least Mod I for basic ADLs (ie, brushing her hair, bathing, brushing her teeth and folding laundry) to assist in ability to carry out selfcare and higher-level homecare tasks with less difficulty. Goal status: INITIAL   5.  Pt will improve coordination skills in right hand, as seen by better score on 9 hole peg testing to have increased functional ability to carry out fine motor tasks (fasteners, etc.) and more complex, coordinated IADLs (meal prep, sports, etc.).  Goal status: INITIAL/TBD   6.  Pt will decrease pain at worst from 7/10 to 2/10 or less to have better sleep and occupational participation in daily roles. Goal status: IN Progress  ASSESSMENT:  CLINICAL IMPRESSION: Patient is a 54 y.o. female who was seen today for occupational therapy treatment for R [facility] arthroplasty and CTR 06/23/2024.  Good understanding of putty HEP though remains limited by lack of R wrist extension. Edema seems better managed since last week. Will consider use of taping periodically as needed.   PERFORMANCE DEFICITS: in functional skills including ADLs, IADLs, coordination, ROM, strength, pain, fascial restrictions, muscle spasms, flexibility, Gross motor control, body mechanics, endurance, decreased  knowledge of precautions, and UE functional use, cognitive skills including problem solving, and psychosocial skills including coping strategies, environmental adaptation, and habits.   IMPAIRMENTS: are limiting patient from ADLs, IADLs, rest and sleep, work, and leisure.   COMORBIDITIES: may have co-morbidities  that affects occupational performance. Patient will benefit from skilled OT to address above impairments and improve overall function.  REHAB POTENTIAL: Excellent  PLAN:  OT FREQUENCY: 1-2x/week  OT DURATION: up to 10 weeks   PLANNED INTERVENTIONS: 97168 OT Re-evaluation, 97535 self care/ADL training, 02889 therapeutic exercise, 97530 therapeutic activity, 97112 neuromuscular re-education, 97140 manual therapy, 97035 ultrasound, 97018 paraffin, 02960 fluidotherapy, 97010 moist heat, 97010 cryotherapy, 97032 electrical stimulation (manual), 97750 Physical Performance Testing, 02239 Orthotic Initial, [id number] Prosthetic Initial, [id number] Orthotic/Prosthetic subsequent, scar mobilization, passive range of motion, compression bandaging, Dry needling, energy conservation, coping strategies training, patient/family education, and DME and/or AE instructions  RECOMMENDED OTHER SERVICES: None now  CONSULTED AND AGREED WITH PLAN OF CARE: Patient  PLAN FOR NEXT SESSION:  How is edema? Wrist ext, additional strengthening as tolerated.   [place]  Hand Protocol volume 1 (pages 35-38) [place]  Hand Protocol treatment volume 3 (pages 263-268)  Jocelyn CHRISTELLA Bottom, OTR/L 08/30/2024, 3:06 PM   "

## 2024-08-31 ENCOUNTER — Telehealth: Payer: Self-pay | Admitting: Orthopedic Surgery

## 2024-08-31 NOTE — Telephone Encounter (Signed)
 IC patient, advised form is ready at front desk to pick up

## 2024-08-31 NOTE — Telephone Encounter (Signed)
 Please provide pts work restrictions.Thank you!

## 2024-09-04 ENCOUNTER — Encounter: Payer: Self-pay | Admitting: Internal Medicine

## 2024-09-04 ENCOUNTER — Telehealth: Payer: Self-pay | Admitting: Orthopedic Surgery

## 2024-09-04 ENCOUNTER — Ambulatory Visit: Payer: PRIVATE HEALTH INSURANCE | Admitting: Internal Medicine

## 2024-09-04 VITALS — BP 110/80 | HR 76 | Temp 98.2°F | Wt 204.2 lb

## 2024-09-04 DIAGNOSIS — J069 Acute upper respiratory infection, unspecified: Secondary | ICD-10-CM

## 2024-09-04 NOTE — Progress Notes (Signed)
 "    Established Patient Office Visit     CC/Reason for Visit: URI symptoms  HPI: Rhonda [name] is a 54 y.o. female who is coming in today for the above mentioned reasons.  Have been present for the past 2 weeks including rhinorrhea, nasal congestion, ear pressure.  She is improving but is going on a cruise and just wanted to be checked.   Past Medical/Surgical History: Past Medical History:  Diagnosis Date   Allergy    Anxiety    C1q nephropathy    Cancer (HCC)    history of thyroid  cancer   Chronic constipation    CKD (chronic kidney disease), stage II nephrologist-  dr dominique pipe [facility])   secondary to C1q nephropathy/  hypertension   Family history of adverse reaction to anesthesia    father hard to put down for anesthesia   GERD (gastroesophageal reflux disease)    H/O radioactive iodine thyroid  ablation    1992  right throid   History of exercise intolerance    01-052011  ETT--  normal w/ no ischemia per cardiologist (dr rolan) epic note   History of pulmonary embolus (PE)    07/ 2008-- treated w/ coumadin   History of syncope    recurrent --  workup done per epic -- dx orthostatic hypotension   Hyperlipidemia    Hypertension    IBS (irritable bowel syndrome)    Neuromuscular disorder (HCC)    carpal tunnel   Pelvic pain in female    Post-surgical hypothyroidism    1991 left thyroidectomy   Pre-diabetes    Proteinuria    Seasonal and perennial allergic rhinitis    Wears contact lenses    Wears glasses     Past Surgical History:  Procedure Laterality Date   69 HOUR PH STUDY N/A 11/16/2016   Procedure: 24 HOUR PH STUDY;  Surgeon: Victory LITTIE Legrand DOUGLAS, MD;  [facility]: [facility];  Service: Gastroenterology;  Laterality: N/A;   ABDOMINAL HYSTERECTOMY  1998   CARPAL TUNNEL RELEASE Right 06/23/2024   Procedure: CARPAL TUNNEL RELEASE;  Surgeon: Arlinda Buster, MD;  Location: [facility];  Service: Orthopedics;  Laterality: Right;   COLONOSCOPY     COLONOSCOPY WITH PROPOFOL  N/A 08/05/2021   Procedure: COLONOSCOPY WITH PROPOFOL ;  Surgeon: Federico Rosario BROCKS, MD;  Location: [facility];  Service: Gastroenterology;  Laterality: N/A;   CYSTOSCOPY N/A 04/28/2016   Procedure: CYSTOSCOPY;  Surgeon: Norleen Skill, MD;  Location: [facility];  Service: Gynecology;  Laterality: N/A;   ESOPHAGEAL MANOMETRY N/A 11/16/2016   Procedure: ESOPHAGEAL MANOMETRY (EM);  Surgeon: Victory LITTIE Legrand DOUGLAS, MD;  [facility];  Service: Gastroenterology;  Laterality: N/A;   ESOPHAGOGASTRODUODENOSCOPY     FINGER ARTHROPLASTY Right 06/23/2024   Procedure: ARTHROPLASTY, FINGER;  Surgeon: Arlinda Buster, MD;  Location: [facility];  Service: Orthopedics;  Laterality: Right;  RIGHT THUMB CARPOMATACARPAL ARTHROPLASTY WITH INTERNAL BRACE/ RIGHT OPEN CARPAL TUNNEL RELEASE   LAPAROSCOPIC CHOLECYSTECTOMY  1997   LAPAROSCOPY N/A 03/26/2016   Procedure: LAPAROSCOPY DIAGNOSTIC, LYSIS OF ADHESIONS;  Surgeon: Norleen Skill, MD;  Location: [facility];  Service: Gynecology;  Laterality: N/A;   LAPAROTOMY N/A 04/28/2016   Procedure: EXPLORATORY LAPAROTOMY;  Surgeon: Norleen Skill, MD;  Location: [facility];  Service: Gynecology;  Laterality: N/A;   RENAL BIOPSY     SALPINGOOPHORECTOMY Bilateral 04/28/2016   Procedure: Bilateral OOPHORECTOMY;  Surgeon: Norleen Skill, MD;  Location: [facility];  Service: Gynecology;  Laterality:  Bilateral;   THYROIDECTOMY Left 1991   TRANSTHORACIC ECHOCARDIOGRAM  04/27/2012   normal LV, ef 60-65%/  trivial MR   TUBAL LIGATION  1992    Social History:  reports that she has never smoked. She has never used smokeless tobacco. She reports that she does not drink alcohol and does not use drugs.  Allergies: Allergies[1]  Family History:  Family History  Problem Relation Age of Onset   Diabetes Mother        Pre-diabetic   Hypertension Mother     Hypertension Father    Coronary artery disease Father    Diabetes Father    Stroke Father    Prostate cancer Father    Heart disease Father    Breast cancer Maternal Aunt    Thyroid  disease Maternal Aunt    Colon cancer Neg Hx    Colon polyps Neg Hx    Kidney disease Neg Hx    Esophageal cancer Neg Hx    Gallbladder disease Neg Hx    Stomach cancer Neg Hx    Rectal cancer Neg Hx     Current Medications[2]  Review of Systems:  Negative unless indicated in HPI.   Physical Exam: Vitals:   09/04/24 1327  BP: 110/80  Pulse: 76  Temp: 98.2 F (36.8 C)  TempSrc: Oral  SpO2: 99%  Weight: 204 lb 3.2 oz (92.6 kg)    Body mass index is 33.98 kg/m.   Physical Exam Vitals reviewed.  Constitutional:      Appearance: Normal appearance.  HENT:     Right Ear: Tympanic membrane, ear canal and external ear normal.     Left Ear: Tympanic membrane, ear canal and external ear normal.     Mouth/Throat:     Mouth: Mucous membranes are moist.     Pharynx: Oropharynx is clear.  Eyes:     Conjunctiva/sclera: Conjunctivae normal.     Pupils: Pupils are equal, round, and reactive to light.  Cardiovascular:     Rate and Rhythm: Normal rate and regular rhythm.  Pulmonary:     Effort: Pulmonary effort is normal.     Breath sounds: Normal breath sounds.  Neurological:     Mental Status: She is alert.      Impression and Plan:  Viral upper respiratory tract infection  -Given exam findings, PNA, pharyngitis, ear infection are not likely, hence abx have not been prescribed. -Have advised rest, fluids, OTC antihistamines, cough suppressants and mucinex. -RTC if no improvement in 10-14 days.    Time spent:22 minutes reviewing chart, interviewing and examining patient and formulating plan of care.     Rhonda Theophilus Andrews, MD [facility]      [1] Allergies Allergen Reactions   Aspirin  Hives   Flagyl  [Metronidazole ] Nausea And  Vomiting    With oral flagyl .   Has tolerated vaginal flagyl .     Food Other (See Comments)    Lima beans--  Positive allergy test    Tramadol  Other (See Comments)    Headache    Dilaudid  [Hydromorphone ] Itching  [2]  Current Outpatient Medications:    albuterol  (VENTOLIN  HFA) 108 (90 Base) MCG/ACT inhaler, Inhale 2 puffs into the lungs every 6 (six) hours as needed., Disp: 6.7 g, Rfl: 1   ALPRAZolam  (XANAX ) 0.25 MG tablet, Take 1 tablet (0.25 mg total) by mouth 2 (two) times daily as needed for anxiety., Disp: 60 tablet, Rfl: 0   atorvastatin  (LIPITOR) 40 MG tablet, TAKE 1 TABLET(40 MG)  BY MOUTH DAILY, Disp: 90 tablet, Rfl: 1   benazepril -hydrochlorthiazide (LOTENSIN  HCT) 20-25 MG tablet, TAKE 1 TABLET BY MOUTH DAILY, Disp: 90 tablet, Rfl: 1   cetirizine (ZYRTEC) 10 MG tablet, Take 10 mg by mouth daily., Disp: , Rfl:    docusate sodium  (COLACE) 100 MG capsule, Take 100 mg by mouth daily., Disp: , Rfl:    ELIQUIS  5 MG TABS tablet, TAKE 1 TABLET(5 MG) BY MOUTH TWICE DAILY, Disp: 180 tablet, Rfl: 1   esomeprazole  (NEXIUM ) 40 MG capsule, TAKE ONE CAPSULE BY MOUTH EVERY DAY AT 12 NOON., Disp: 90 capsule, Rfl: 1   Lancet Devices (ONETOUCH DELICA PLUS LANCING) MISC, 1 each by Does not apply route daily., Disp: 100 each, Rfl: 12   Lancets (ONETOUCH DELICA PLUS LANCET33G) MISC, USE AS DIRECTED ONCE DAILY, Disp: 100 each, Rfl: 3   levothyroxine  (SYNTHROID ) 112 MCG tablet, Take 1 tablet (112 mcg total) by mouth daily., Disp: 90 tablet, Rfl: 3   meclizine  (ANTIVERT ) 50 MG tablet, Take 1 tablet (50 mg total) by mouth 3 (three) times daily as needed., Disp: 30 tablet, Rfl: 0   metroNIDAZOLE  (METROGEL ) 0.75 % vaginal gel, Place 1 Applicatorful vaginally 2 (two) times daily., Disp: 70 g, Rfl: 0   ondansetron  (ZOFRAN -ODT) 4 MG disintegrating tablet, Take 1 tablet (4 mg total) by mouth every 8 (eight) hours as needed for nausea or vomiting., Disp: 20 tablet, Rfl: 0   ONETOUCH ULTRA TEST test  strip, CHECK BLOOD GLUCOSE ONCE DAILY, Disp: 100 strip, Rfl: 3   oxyCODONE  (ROXICODONE ) 5 MG immediate release tablet, Take 1 tablet (5 mg total) by mouth every 6 (six) hours as needed., Disp: 30 tablet, Rfl: 0   Peppermint Oil (IBGARD PO), Take 1 tablet by mouth daily., Disp: , Rfl:    scopolamine  (TRANSDERM-SCOP) 1 MG/3DAYS, Place 1 patch (1 mg total) onto the skin every 3 (three) days., Disp: 10 patch, Rfl: 12 "

## 2024-09-04 NOTE — Telephone Encounter (Signed)
 Received call from patient. Need to add specific restrictions to the completed T[employer] Leave form. I added specifics and faxed per pts request. Copy at front desk for patient to pick up.

## 2024-09-06 ENCOUNTER — Ambulatory Visit: Payer: PRIVATE HEALTH INSURANCE | Admitting: Occupational Therapy

## 2024-09-06 DIAGNOSIS — M6281 Muscle weakness (generalized): Secondary | ICD-10-CM

## 2024-09-06 DIAGNOSIS — R600 Localized edema: Secondary | ICD-10-CM

## 2024-09-06 DIAGNOSIS — M25631 Stiffness of right wrist, not elsewhere classified: Secondary | ICD-10-CM

## 2024-09-06 DIAGNOSIS — L905 Scar conditions and fibrosis of skin: Secondary | ICD-10-CM

## 2024-09-06 DIAGNOSIS — M25531 Pain in right wrist: Secondary | ICD-10-CM

## 2024-09-06 DIAGNOSIS — M25541 Pain in joints of right hand: Secondary | ICD-10-CM

## 2024-09-06 DIAGNOSIS — R278 Other lack of coordination: Secondary | ICD-10-CM

## 2024-09-06 DIAGNOSIS — R29898 Other symptoms and signs involving the musculoskeletal system: Secondary | ICD-10-CM

## 2024-09-06 NOTE — Therapy (Signed)
 " OUTPATIENT OCCUPATIONAL THERAPY ORTHO TREATMENT  Patient Name: Rhonda [name] MRN: [id number] DOB:06-Jun-1971, 54 y.o., female Today's Date: 09/06/2024  PCP: Theophilus Delma Krabbe, MD  REFERRING PROVIDER: Arlinda Buster, MD  END OF SESSION:  OT End of Session - 09/06/24 1405     Visit Number 9    Number of Visits 21    Date for Recertification  09/22/24    Authorization Type BC[employer]    OT Start Time 1404    OT Stop Time 1448    OT Time Calculation (min) 44 min    Activity Tolerance Patient tolerated treatment well    Behavior During Therapy WFL for tasks assessed/performed         Past Medical History:  Diagnosis Date   Allergy    Anxiety    C1q nephropathy    Cancer (HCC)    history of thyroid  cancer   Chronic constipation    CKD (chronic kidney disease), stage II nephrologist-  dr dominique pipe [facility])   secondary to C1q nephropathy/  hypertension   Family history of adverse reaction to anesthesia    father hard to put down for anesthesia   GERD (gastroesophageal reflux disease)    H/O radioactive iodine thyroid  ablation    1992  right throid   History of exercise intolerance    01-052011  ETT--  normal w/ no ischemia per cardiologist (dr rolan) epic note   History of pulmonary embolus (PE)    07/ 2008-- treated w/ coumadin   History of syncope    recurrent --  workup done per epic -- dx orthostatic hypotension   Hyperlipidemia    Hypertension    IBS (irritable bowel syndrome)    Neuromuscular disorder (HCC)    carpal tunnel   Pelvic pain in female    Post-surgical hypothyroidism    1991 left thyroidectomy   Pre-diabetes    Proteinuria    Seasonal and perennial allergic rhinitis    Wears contact lenses    Wears glasses    Past Surgical History:  Procedure Laterality Date   37 HOUR PH STUDY N/A 11/16/2016   Procedure: 24 HOUR PH STUDY;  Surgeon: Victory LITTIE Legrand DOUGLAS, MD;  Location: [facility];  Service:  Gastroenterology;  Laterality: N/A;   ABDOMINAL HYSTERECTOMY  1998   CARPAL TUNNEL RELEASE Right 06/23/2024   Procedure: CARPAL TUNNEL RELEASE;  Surgeon: Arlinda Buster, MD;  Location: [facility];  Service: Orthopedics;  Laterality: Right;   COLONOSCOPY     COLONOSCOPY WITH PROPOFOL  N/A 08/05/2021   Procedure: COLONOSCOPY WITH PROPOFOL ;  Surgeon: Federico Rosario BROCKS, MD;  Location: [facility];  Service: Gastroenterology;  Laterality: N/A;   CYSTOSCOPY N/A 04/28/2016   Procedure: CYSTOSCOPY;  Surgeon: Norleen Skill, MD;  Location: [facility];  Service: Gynecology;  Laterality: N/A;   ESOPHAGEAL MANOMETRY N/A 11/16/2016   Procedure: ESOPHAGEAL MANOMETRY (EM);  Surgeon: Victory LITTIE Legrand DOUGLAS, MD;  Location: [facility];  Service: Gastroenterology;  Laterality: N/A;   ESOPHAGOGASTRODUODENOSCOPY     FINGER ARTHROPLASTY Right 06/23/2024   Procedure: ARTHROPLASTY, FINGER;  Surgeon: Arlinda Buster, MD;  Location: [facility];  Service: Orthopedics;  Laterality: Right;  RIGHT THUMB CARPOMATACARPAL ARTHROPLASTY WITH INTERNAL BRACE/ RIGHT OPEN CARPAL TUNNEL RELEASE   LAPAROSCOPIC CHOLECYSTECTOMY  1997   LAPAROSCOPY N/A 03/26/2016   Procedure: LAPAROSCOPY DIAGNOSTIC, LYSIS OF ADHESIONS;  Surgeon: Norleen Skill, MD;  Location: [facility];  Service: Gynecology;  Laterality: N/A;   LAPAROTOMY N/A 04/28/2016  Procedure: EXPLORATORY LAPAROTOMY;  Surgeon: Norleen Skill, MD;  Location: [facility];  Service: Gynecology;  Laterality: N/A;   RENAL BIOPSY     SALPINGOOPHORECTOMY Bilateral 04/28/2016   Procedure: Bilateral OOPHORECTOMY;  Surgeon: Norleen Skill, MD;  Location: [facility];  Service: Gynecology;  Laterality: Bilateral;   THYROIDECTOMY Left 1991   TRANSTHORACIC ECHOCARDIOGRAM  04/27/2012   normal LV, ef 60-65%/  trivial MR   TUBAL LIGATION  1992   Patient Active Problem List   Diagnosis Date Noted   Arthritis of carpometacarpal [facility]) joint of right thumb 06/23/2024   Chronic  pain of right wrist 08/16/2023   Hypomagnesemia 10/06/2021   Hypercalcemia 10/06/2021   Colon cancer screening    Carpal tunnel syndrome, right upper limb 01/07/2021   Flexor carpi radialis tendinitis 01/07/2021   Vitamin D  deficiency 05/19/2019   IGT (impaired glucose tolerance) 05/19/2019   Exposure to medical diagnostic radiation 02/09/2017   S/P bilateral salpingo-oophorectomy 04/28/2016   Pelvic adhesions 03/26/2016    Class: Present on Admission   History of pulmonary embolism 08/01/2014   CKD (chronic kidney disease) stage 3, GFR 30-59 ml/min (HCC) 02/27/2014   Hypothyroidism 04/28/2012   Hemorrhage of rectum and anus 02/09/2011   Constipation 02/09/2011   Dyslipidemia 08/26/2009   Backache 02/14/2008   Allergic rhinitis 11/15/2007   GERD 06/07/2007   Anxiety state 04/27/2007   Essential hypertension 04/27/2007   Disorder of kidney and ureter 03/17/2007   EMBOLISM/THROMBOSIS, DEEP VSL PRXML LWR EXTRM 03/16/2007    ONSET DATE: 05/25/2024 (Date of referral); DOS: 06/23/2024  REFERRING DIAG:  M18.11 (ICD-10-CM) - Arthritis of carpometacarpal (CMC) joint of right thumb  G56.01 (ICD-10-CM) - Carpal tunnel syndrome, right upper limb   THERAPY DIAG:  Stiffness of right wrist, not elsewhere classified  Other symptoms and signs involving the musculoskeletal system  Localized edema  Pain in joint of right hand  Muscle weakness (generalized)  Other lack of coordination  Pain in right wrist  Scar condition and fibrosis of skin  Rationale for Evaluation and Treatment: Rehabilitation  SUBJECTIVE:   SUBJECTIVE STATEMENT:  No recent swelling though hypersensitivity to base of R thumb.  Pt accompanied by: husband  PERTINENT HISTORY: Pt was seen in 2024 and  2025 by OP OT to address Rt hand/wrist/thumb pain the underwent right thumb carpometacarpal arthroplasty with tendon transfer flexor carpi radialis to abductor pollicis longus. Right partial trapezoidectomy. Right  wrist first extensor compartment release. Right open carpal tunnel release.  on 06/23/2024 after conservative measures were unsuccessful.   PRECAUTIONS: per MD 07/31/24: She is doing well postoperatively.  She can convert her wrist splint to a hand-based thumb spica orthosis at this point as well.  Continue with range of motion exercises for the next 2 weeks, progressive strengthening at week 8 per protocol.   As per [place]  Hand Protocol for post op Right [facility] arthroplasty and pertinent history/MD notes above.  WEIGHT BEARING RESTRICTIONS:Yes , NWB, No functional use right hand/wrist/thumb  PAIN:  Are you having pain? No   FALLS: Has patient fallen in last 6 months? Yes. Number of falls 1 - 8/21 while assisting a pt, pt had muscle spasms in back as a result  PLOF: Independent  PATIENT GOALS: To improve use of RUE  NEXT MD VISIT:   OBJECTIVE: (All objective assessments below are from initial evaluation on: 07/06/2024 unless otherwise specified.)   HAND DOMINANCE: Right   ADLs: Overall ADLs: mostly mod I though requires occasional assistance from husband for fasteners.   FUNCTIONAL  OUTCOME MEASURES: QuickDASH: 88.6 % with use of RUE   UPPER EXTREMITY ROM:     AROM Right (eval) Left (eval)  Shoulder flexion WNL WNL  Shoulder abduction  WNL  Elbow flexion WFL WNL  Elbow extension WFL with increased pain WNL  Wrist flexion  WNL  Wrist extension  WNL  Wrist pronation  WNL  Wrist supination  WNL   Digit Composite Flexion  WNL  Digit Composite Extension  WNL  Digit Opposition  WNL  (Blank rows = not tested)  HAND FUNCTION: Grip strength: Right: Deferred lbs; Left: NT lbs  Lateral pinch: Right: Deferred lbs, Left: NT lbs  3 point pinch: Right: Deferred lbs, Left: NT lbs   COORDINATION: 9 Hole Peg test: Right: Deferred sec; Left: NT sec  SENSATION: Eval:  severe paresthesias reported through RUE  EDEMA:   Eval: mild to moderate edema appreciated  COGNITION: Eval:  Overall cognitive status: WFL for evaluation today   OBSERVATIONS:   Pt ambulates without use of AD. No loss of balance. The pt appears well kept and has arm sling donned to RUE. Incisions scabbed over following suture removal. Pt holds RUE in guarded positioning including palm.  TODAY'S TREATMENT:  - Manual therapy completed for duration as noted below including:   Performed targeted pressure on restricted soft tissues with controlled patient movement to reduce adhesions, improve tissue glide, and restore functional range of motion while reducing pain. Pt completed thumb ulnar and radial abduction.   - Neuro re-education completed for duration as noted below including: Demonstrated and engaged in Rice bin activity to help with sensory stimulation of right hand/s.  De/resensitization demonstrated with suggestions of alternatives at home ie) dry beans, macaroni.  Pt encouraged to use bilateral hand/s to find objects hidden in the rice and encouraged to try and identify objects or describe them with vision occluded with Good succes at identifying key features of items such as block, peg, checker, screw, etc. Pt was able to identify 8 of objects without seeing them at first.  Pt could match them appropriately.  Pt continues to be encouraged to use visualization, verbal feedback/positive talk to help with awareness of different features of objects ie) round, square, spiky, rubbery, cool etc and I'm comparison to L, unaffected extremity.   - Ultrasound completed for duration as noted below including:  Ultrasound applied to palmar and dorsal right hand, digits, and wrist for 8 minutes, frequency of 3 MHz, 20% duty cycle, and 1.1 W/cm with pt's arm placed on soft towel for promotion of ROM, edema reduction, and pain reduction in affected extremity.  PATIENT EDUCATION: Education details: see above Person educated: Patient and spouse Education method: Engineer, Structural, demonstration, Teach  back Education comprehension: States and demonstrates understanding, Additional Education required   HOME EXERCISE PROGRAM: 07/06/2024: R digit and wrist ROM (excluding thumb); contrast baths; heat 08/23/2024: yellow putty and wrist stretches Access Code: [email or web address]  GOALS: SHORT TERM GOALS:   MET 2/2  LONG TERM GOALS: Target Date: 09/22/2024  Patient will demonstrate at least 16% improvement with quick Dash score (reporting 72.6% disability or less) indicating improved functional use of affected extremity.  Baseline: 88.6% disability with use fo RUE.  Goal status: INITIAL   2.  Pt will improve grip strength in right hand from unable to assess to at least 15 lbs for functional use at home and in IADLs. Goal status: INITIAL   3.  Pt will improve A/ROM in right thumb from opposition to tip of index  finger on right to at least opposition to PIP (or greater) of right small finger, to have functional motion for tasks like ADLs, reach, and grasp.  Goal status: INITIAL   4.  Pt will improve right hand functional use from unable to assess to at least Mod I for basic ADLs (ie, brushing her hair, bathing, brushing her teeth and folding laundry) to assist in ability to carry out selfcare and higher-level homecare tasks with less difficulty. Goal status: INITIAL   5.  Pt will improve coordination skills in right hand, as seen by better score on 9 hole peg testing to have increased functional ability to carry out fine motor tasks (fasteners, etc.) and more complex, coordinated IADLs (meal prep, sports, etc.).  Goal status: INITIAL/TBD   6.  Pt will decrease pain at worst from 7/10 to 2/10 or less to have better sleep and occupational participation in daily roles. Goal status: IN Progress  ASSESSMENT:  CLINICAL IMPRESSION: Patient is a 54 y.o. female who was seen today for occupational therapy treatment for R [facility] arthroplasty and CTR 06/23/2024.  Improved pt tolerance to palpation following  desensitization efforts and use of ultrasound. Recommend full d/c of splint and tensogrip sleeve.  PERFORMANCE DEFICITS: in functional skills including ADLs, IADLs, coordination, ROM, strength, pain, fascial restrictions, muscle spasms, flexibility, Gross motor control, body mechanics, endurance, decreased knowledge of precautions, and UE functional use, cognitive skills including problem solving, and psychosocial skills including coping strategies, environmental adaptation, and habits.   IMPAIRMENTS: are limiting patient from ADLs, IADLs, rest and sleep, work, and leisure.   COMORBIDITIES: may have co-morbidities  that affects occupational performance. Patient will benefit from skilled OT to address above impairments and improve overall function.  REHAB POTENTIAL: Excellent  PLAN:  OT FREQUENCY: 1-2x/week  OT DURATION: up to 10 weeks   PLANNED INTERVENTIONS: 97168 OT Re-evaluation, 97535 self care/ADL training, 02889 therapeutic exercise, 97530 therapeutic activity, 97112 neuromuscular re-education, 97140 manual therapy, 97035 ultrasound, 97018 paraffin, 02960 fluidotherapy, 97010 moist heat, 97010 cryotherapy, 97032 electrical stimulation (manual), 97750 Physical Performance Testing, 02239 Orthotic Initial, [id number] Prosthetic Initial, [id number] Orthotic/Prosthetic subsequent, scar mobilization, passive range of motion, compression bandaging, Dry needling, energy conservation, coping strategies training, patient/family education, and DME and/or AE instructions  RECOMMENDED OTHER SERVICES: None now  CONSULTED AND AGREED WITH PLAN OF CARE: Patient  PLAN FOR NEXT SESSION: assess grip, pinch, and pegs  Wrist ext, additional strengthening as tolerated.   [place]  Hand Protocol volume 1 (pages 35-38) [place]  Hand Protocol treatment volume 3 (pages 263-268)  Jocelyn CHRISTELLA Bottom, OTR/L 09/06/2024, 4:33 PM   "

## 2024-09-12 NOTE — Progress Notes (Signed)
" ° °  Tramaine Snell - 54 y.o. female MRN [id number]  Date of birth: [birth year]  Office Visit Note: Visit Date: 09/13/2024 PCP: Theophilus Andrews, [name], MD Referred by: Theophilus Andrews, [name], MD  Subjective:  HPI: Emmajo Bennette is a 54 y.o. female who presents today for follow up 12 weeks status post right thumb carpometacarpal arthroplasty with Right open carpal tunnel release.  Doing well overall, she is progressing nicely with OT.  Pertinent ROS were reviewed with the patient and found to be negative unless otherwise specified above in HPI.   Assessment & Plan: Visit Diagnoses: No diagnosis found.  Plan: She is doing well postoperatively.  Continue with range of motion protocol and strengthening.  At this juncture she is comfortable resuming activities as tolerated.  From a work standpoint, we will keep her without lifting above 10 pounds for the next 4 weeks.  She can follow-up at that time, and likely at that juncture we should be able to progress to activities as tolerated without restriction.  Follow-up: No follow-ups on file.   Meds & Orders: No orders of the defined types were placed in this encounter.  No orders of the defined types were placed in this encounter.    Procedures: No procedures performed       Objective:   Vital Signs: There were no vitals taken for this visit.  Ortho Exam Right wrist: - Well-healed incision at the glabrous/nonglabrous juncture over the [facility] region of the thumb, well-healed palmar incision - Thumb opposition to the long finger PIP, improved passively - Thumb circumduction without significant pain or crepitus - Hand is warm well-perfused, sensation intact in all distributions including DRSN - Sensation intact to light touch in the median nerve distributions, AIN/PIN/interosseous intact - Thumb pinch strength right 6, left 12   Imaging: No results found.   Aribella Vavra Afton Alderton, M.D. [facility],  Hand Surgery  "

## 2024-09-13 ENCOUNTER — Ambulatory Visit: Payer: PRIVATE HEALTH INSURANCE | Admitting: Occupational Therapy

## 2024-09-13 ENCOUNTER — Ambulatory Visit: Payer: PRIVATE HEALTH INSURANCE | Admitting: Orthopedic Surgery

## 2024-09-13 DIAGNOSIS — M25631 Stiffness of right wrist, not elsewhere classified: Secondary | ICD-10-CM

## 2024-09-13 DIAGNOSIS — R29898 Other symptoms and signs involving the musculoskeletal system: Secondary | ICD-10-CM

## 2024-09-13 DIAGNOSIS — L905 Scar conditions and fibrosis of skin: Secondary | ICD-10-CM

## 2024-09-13 DIAGNOSIS — M25642 Stiffness of left hand, not elsewhere classified: Secondary | ICD-10-CM

## 2024-09-13 DIAGNOSIS — M25532 Pain in left wrist: Secondary | ICD-10-CM

## 2024-09-13 DIAGNOSIS — R278 Other lack of coordination: Secondary | ICD-10-CM

## 2024-09-13 DIAGNOSIS — M25531 Pain in right wrist: Secondary | ICD-10-CM

## 2024-09-13 DIAGNOSIS — M1811 Unilateral primary osteoarthritis of first carpometacarpal joint, right hand: Secondary | ICD-10-CM

## 2024-09-13 DIAGNOSIS — M25541 Pain in joints of right hand: Secondary | ICD-10-CM

## 2024-09-13 DIAGNOSIS — R600 Localized edema: Secondary | ICD-10-CM

## 2024-09-13 DIAGNOSIS — M6281 Muscle weakness (generalized): Secondary | ICD-10-CM

## 2024-09-13 NOTE — Therapy (Signed)
 " OUTPATIENT OCCUPATIONAL THERAPY ORTHO TREATMENT  Patient Name: Rhonda [name] MRN: [id number] DOB:1971-02-22, 54 y.o., female Today's Date: 09/13/2024  PCP: Theophilus Delma Krabbe, MD  REFERRING PROVIDER: Arlinda Buster, MD  END OF SESSION:  OT End of Session - 09/13/24 1404     Visit Number 10    Number of Visits 21    Date for Recertification  09/22/24    Authorization Type BC[employer]    OT Start Time 1403    OT Stop Time 1444    OT Time Calculation (min) 41 min    Activity Tolerance Patient tolerated treatment well    Behavior During Therapy WFL for tasks assessed/performed         Past Medical History:  Diagnosis Date   Allergy    Anxiety    C1q nephropathy    Cancer (HCC)    history of thyroid  cancer   Chronic constipation    CKD (chronic kidney disease), stage II nephrologist-  dr dominique pipe [facility])   secondary to C1q nephropathy/  hypertension   Family history of adverse reaction to anesthesia    father hard to put down for anesthesia   GERD (gastroesophageal reflux disease)    H/O radioactive iodine thyroid  ablation    1992  right throid   History of exercise intolerance    01-052011  ETT--  normal w/ no ischemia per cardiologist (dr rolan) epic note   History of pulmonary embolus (PE)    07/ 2008-- treated w/ coumadin   History of syncope    recurrent --  workup done per epic -- dx orthostatic hypotension   Hyperlipidemia    Hypertension    IBS (irritable bowel syndrome)    Neuromuscular disorder (HCC)    carpal tunnel   Pelvic pain in female    Post-surgical hypothyroidism    1991 left thyroidectomy   Pre-diabetes    Proteinuria    Seasonal and perennial allergic rhinitis    Wears contact lenses    Wears glasses    Past Surgical History:  Procedure Laterality Date   61 HOUR PH STUDY N/A 11/16/2016   Procedure: 24 HOUR PH STUDY;  Surgeon: Victory LITTIE Legrand DOUGLAS, MD;  Location: [facility];  Service:  Gastroenterology;  Laterality: N/A;   ABDOMINAL HYSTERECTOMY  1998   CARPAL TUNNEL RELEASE Right 06/23/2024   Procedure: CARPAL TUNNEL RELEASE;  Surgeon: Arlinda Buster, MD;  Location: [facility];  Service: Orthopedics;  Laterality: Right;   COLONOSCOPY     COLONOSCOPY WITH PROPOFOL  N/A 08/05/2021   Procedure: COLONOSCOPY WITH PROPOFOL ;  Surgeon: Federico Rosario BROCKS, MD;  Location: [facility];  Service: Gastroenterology;  Laterality: N/A;   CYSTOSCOPY N/A 04/28/2016   Procedure: CYSTOSCOPY;  Surgeon: Norleen Skill, MD;  Location: [facility];  Service: Gynecology;  Laterality: N/A;   ESOPHAGEAL MANOMETRY N/A 11/16/2016   Procedure: ESOPHAGEAL MANOMETRY (EM);  Surgeon: Victory LITTIE Legrand DOUGLAS, MD;  Location: [facility];  Service: Gastroenterology;  Laterality: N/A;   ESOPHAGOGASTRODUODENOSCOPY     FINGER ARTHROPLASTY Right 06/23/2024   Procedure: ARTHROPLASTY, FINGER;  Surgeon: Arlinda Buster, MD;  Location: [facility];  Service: Orthopedics;  Laterality: Right;  RIGHT THUMB CARPOMATACARPAL ARTHROPLASTY WITH INTERNAL BRACE/ RIGHT OPEN CARPAL TUNNEL RELEASE   LAPAROSCOPIC CHOLECYSTECTOMY  1997   LAPAROSCOPY N/A 03/26/2016   Procedure: LAPAROSCOPY DIAGNOSTIC, LYSIS OF ADHESIONS;  Surgeon: Norleen Skill, MD;  Location: [facility];  Service: Gynecology;  Laterality: N/A;   LAPAROTOMY N/A 04/28/2016  Procedure: EXPLORATORY LAPAROTOMY;  Surgeon: Norleen Skill, MD;  Location: [facility];  Service: Gynecology;  Laterality: N/A;   RENAL BIOPSY     SALPINGOOPHORECTOMY Bilateral 04/28/2016   Procedure: Bilateral OOPHORECTOMY;  Surgeon: Norleen Skill, MD;  Location: [facility];  Service: Gynecology;  Laterality: Bilateral;   THYROIDECTOMY Left 1991   TRANSTHORACIC ECHOCARDIOGRAM  04/27/2012   normal LV, ef 60-65%/  trivial MR   TUBAL LIGATION  1992   Patient Active Problem List   Diagnosis Date Noted   Arthritis of carpometacarpal [facility]) joint of right thumb 06/23/2024   Chronic  pain of right wrist 08/16/2023   Hypomagnesemia 10/06/2021   Hypercalcemia 10/06/2021   Colon cancer screening    Carpal tunnel syndrome, right upper limb 01/07/2021   Flexor carpi radialis tendinitis 01/07/2021   Vitamin D  deficiency 05/19/2019   IGT (impaired glucose tolerance) 05/19/2019   Exposure to medical diagnostic radiation 02/09/2017   S/P bilateral salpingo-oophorectomy 04/28/2016   Pelvic adhesions 03/26/2016    Class: Present on Admission   History of pulmonary embolism 08/01/2014   CKD (chronic kidney disease) stage 3, GFR 30-59 ml/min (HCC) 02/27/2014   Hypothyroidism 04/28/2012   Hemorrhage of rectum and anus 02/09/2011   Constipation 02/09/2011   Dyslipidemia 08/26/2009   Backache 02/14/2008   Allergic rhinitis 11/15/2007   GERD 06/07/2007   Anxiety state 04/27/2007   Essential hypertension 04/27/2007   Disorder of kidney and ureter 03/17/2007   EMBOLISM/THROMBOSIS, DEEP VSL PRXML LWR EXTRM 03/16/2007    ONSET DATE: 05/25/2024 (Date of referral); DOS: 06/23/2024  REFERRING DIAG:  M18.11 (ICD-10-CM) - Arthritis of carpometacarpal (CMC) joint of right thumb  G56.01 (ICD-10-CM) - Carpal tunnel syndrome, right upper limb   THERAPY DIAG:  Stiffness of right wrist, not elsewhere classified  Other symptoms and signs involving the musculoskeletal system  Localized edema  Pain in joint of right hand  Other lack of coordination  Muscle weakness (generalized)  Pain in right wrist  Scar condition and fibrosis of skin  Stiffness of left hand, not elsewhere classified  Pain in left wrist  Rationale for Evaluation and Treatment: Rehabilitation  SUBJECTIVE:   SUBJECTIVE STATEMENT:  She did well on her cruise but may have overdone it as she has had a recent increase to pain. No longer wearing her brace.  Pt accompanied by: husband  PERTINENT HISTORY: Pt was seen in 2024 and  2025 by OP OT to address Rt hand/wrist/thumb pain the underwent right thumb  carpometacarpal arthroplasty with tendon transfer flexor carpi radialis to abductor pollicis longus. Right partial trapezoidectomy. Right wrist first extensor compartment release. Right open carpal tunnel release.  on 06/23/2024 after conservative measures were unsuccessful.   PRECAUTIONS: per MD 07/31/24: She is doing well postoperatively.  She can convert her wrist splint to a hand-based thumb spica orthosis at this point as well.  Continue with range of motion exercises for the next 2 weeks, progressive strengthening at week 8 per protocol.   As per [place]  Hand Protocol for post op Right [facility] arthroplasty and pertinent history/MD notes above.  WEIGHT BEARING RESTRICTIONS:Yes , NWB, No functional use right hand/wrist/thumb  PAIN:  Are you having pain? No   FALLS: Has patient fallen in last 6 months? Yes. Number of falls 1 - 8/21 while assisting a pt, pt had muscle spasms in back as a result  PLOF: Independent  PATIENT GOALS: To improve use of RUE  NEXT MD VISIT:   OBJECTIVE: (All objective assessments below are from initial evaluation on:  07/06/2024 unless otherwise specified.)   HAND DOMINANCE: Right   ADLs: Overall ADLs: mostly mod I though requires occasional assistance from husband for fasteners.   FUNCTIONAL OUTCOME MEASURES: QuickDASH: 88.6 % with use of RUE  09/13/2024: 22.7 % disability with use of RUE   UPPER EXTREMITY ROM:     AROM Right (eval) Left (eval)  Shoulder flexion WNL WNL  Shoulder abduction  WNL  Elbow flexion WFL WNL  Elbow extension WFL with increased pain WNL  Wrist flexion  WNL  Wrist extension  WNL  Wrist pronation  WNL  Wrist supination  WNL   Digit Composite Flexion  WNL  Digit Composite Extension  WNL  Digit Opposition  WNL  (Blank rows = not tested)  HAND FUNCTION: updated 09/13/2024 Grip strength: Right: 21.3 lbs; Left: 66.5 lbs  Lateral pinch: Right: 11 lbs, Left: 16 lbs  3 point pinch: Right: 10 lbs, Left: 15 lbs   COORDINATION:  updated 09/13/2024 9 Hole Peg test: Right: 24 sec; Left: 20 sec  SENSATION: Eval:  severe paresthesias reported through RUE  EDEMA:   Eval: mild to moderate edema appreciated  COGNITION: Eval: Overall cognitive status: WFL for evaluation today   OBSERVATIONS:   Pt ambulates without use of AD. No loss of balance. The pt appears well kept and has arm sling donned to RUE. Incisions scabbed over following suture removal. Pt holds RUE in guarded positioning including palm.  TODAY'S TREATMENT:  - Self-care/home management completed for duration as noted below including: Objective measures assessed as noted in Goals section to determine progression towards goals. - Therapeutic exercises completed for duration as noted below including:  OT progressed putty HEP to red for increased challenge to strength. OT initiated Red theraband HEP as noted in pt instructions for ROM and strength.   PATIENT EDUCATION: Education details: see above Person educated: Patient and spouse Education method: Engineer, Structural, demonstration, Teach back; handouts Education comprehension: States and demonstrates understanding, Additional Education required   HOME EXERCISE PROGRAM: 07/06/2024: R digit and wrist ROM (excluding thumb); contrast baths; heat 08/23/2024: yellow putty and wrist stretches Access Code: [email or web address] 09/13/2024: RUE red theraband and red putty updated  Access Code: [email or web address]  GOALS: SHORT TERM GOALS:   MET 2/2  LONG TERM GOALS: Target Date: 09/22/2024  Patient will demonstrate at least 16% improvement with quick Dash score (reporting 72.6% disability or less) indicating improved functional use of affected extremity.  Baseline: 88.6% disability with use fo RUE.  Goal status: INITIAL   2.  Pt will improve grip strength in right hand from unable to assess to at least 15 lbs for functional use at home and in IADLs. 09/13/2024: Right: 21.3 lbs Goal status: MET   3.  Pt will improve A/ROM in right  thumb from opposition to tip of index finger on right to at least opposition to PIP (or greater) of right small finger, to have functional motion for tasks like ADLs, reach, and grasp.  Goal status: INITIAL   4.  Pt will improve right hand functional use from unable to assess to at least Mod I for basic ADLs (ie, brushing her hair, bathing, brushing her teeth and folding laundry) to assist in ability to carry out selfcare and higher-level homecare tasks with less difficulty. Goal status: INITIAL   5.  Pt will improve coordination skills in right hand, as seen by better score on 9 hole peg testing to have increased functional ability to carry out fine motor tasks (fasteners, etc.) and more  complex, coordinated IADLs (meal prep, sports, etc.).  Goal status: INITIAL/TBD   6.  Pt will decrease pain at worst from 7/10 to 2/10 or less to have better sleep and occupational participation in daily roles. Goal status: IN Progress  ASSESSMENT:  CLINICAL IMPRESSION: Patient is a 54 y.o. female who was seen today for occupational therapy treatment for [facility] arthroplasty and CTR 06/23/2024.  Good understanding of new initiated/progressed HEP as needed to progress towards goals and in preparation for return to work.   PERFORMANCE DEFICITS: in functional skills including ADLs, IADLs, coordination, ROM, strength, pain, fascial restrictions, muscle spasms, flexibility, Gross motor control, body mechanics, endurance, decreased knowledge of precautions, and UE functional use, cognitive skills including problem solving, and psychosocial skills including coping strategies, environmental adaptation, and habits.   IMPAIRMENTS: are limiting patient from ADLs, IADLs, rest and sleep, work, and leisure.   COMORBIDITIES: may have co-morbidities  that affects occupational performance. Patient will benefit from skilled OT to address above impairments and improve overall function.  REHAB POTENTIAL: Excellent  PLAN:  OT  FREQUENCY: 1-2x/week  OT DURATION: up to 10 weeks   PLANNED INTERVENTIONS: 97168 OT Re-evaluation, 97535 self care/ADL training, 02889 therapeutic exercise, 97530 therapeutic activity, 97112 neuromuscular re-education, 97140 manual therapy, 97035 ultrasound, 97018 paraffin, 02960 fluidotherapy, 97010 moist heat, 97010 cryotherapy, 97032 electrical stimulation (manual), 97750 Physical Performance Testing, 02239 Orthotic Initial, [id number] Prosthetic Initial, [id number] Orthotic/Prosthetic subsequent, scar mobilization, passive range of motion, compression bandaging, Dry needling, energy conservation, coping strategies training, patient/family education, and DME and/or AE instructions  RECOMMENDED OTHER SERVICES: None now  CONSULTED AND AGREED WITH PLAN OF CARE: Patient  PLAN FOR NEXT SESSION: Review prior HEP; d/c vs extend    [place]  Hand Protocol volume 1 (pages 35-38) [place]  Hand Protocol treatment volume 3 (pages 263-268)  Jocelyn CHRISTELLA Bottom, OTR/L 09/13/2024, 3:48 PM   "

## 2024-09-18 ENCOUNTER — Other Ambulatory Visit: Payer: Self-pay | Admitting: Internal Medicine

## 2024-09-19 NOTE — Patient Instructions (Signed)
 SABRA

## 2024-09-20 ENCOUNTER — Encounter: Payer: Self-pay | Admitting: Orthopedic Surgery

## 2024-09-20 ENCOUNTER — Ambulatory Visit: Payer: PRIVATE HEALTH INSURANCE | Admitting: Occupational Therapy

## 2024-09-20 ENCOUNTER — Other Ambulatory Visit: Payer: Self-pay | Admitting: Orthopedic Surgery

## 2024-09-20 DIAGNOSIS — M25531 Pain in right wrist: Secondary | ICD-10-CM

## 2024-09-20 DIAGNOSIS — R278 Other lack of coordination: Secondary | ICD-10-CM

## 2024-09-20 DIAGNOSIS — M25541 Pain in joints of right hand: Secondary | ICD-10-CM

## 2024-09-20 DIAGNOSIS — M6281 Muscle weakness (generalized): Secondary | ICD-10-CM

## 2024-09-20 DIAGNOSIS — M25532 Pain in left wrist: Secondary | ICD-10-CM

## 2024-09-20 DIAGNOSIS — M25631 Stiffness of right wrist, not elsewhere classified: Secondary | ICD-10-CM

## 2024-09-20 DIAGNOSIS — M25642 Stiffness of left hand, not elsewhere classified: Secondary | ICD-10-CM

## 2024-09-20 DIAGNOSIS — R29898 Other symptoms and signs involving the musculoskeletal system: Secondary | ICD-10-CM

## 2024-09-20 NOTE — Therapy (Signed)
 " OUTPATIENT OCCUPATIONAL THERAPY ORTHO TREATMENT and DISCHARGE  Patient Name: Rhonda [name] MRN: [id number] DOB:[birth year], 54 y.o., female Today's Date: 09/20/2024  PCP: Theophilus Delma Krabbe, MD  REFERRING PROVIDER: Arlinda Buster, MD  END OF SESSION:  OT End of Session - 09/20/24 1409     Visit Number 11    Number of Visits 21    Date for Recertification  09/22/24    Authorization Type [employer]    OT Start Time 1408    OT Stop Time 1501    OT Time Calculation (min) 53 min    Activity Tolerance Patient tolerated treatment well    Behavior During Therapy WFL for tasks assessed/performed         Past Medical History:  Diagnosis Date   Allergy    Anxiety    C1q nephropathy    Cancer (HCC)    history of thyroid  cancer   Chronic constipation    CKD (chronic kidney disease), stage II nephrologist-  dr dominique pipe [facility])   secondary to C1q nephropathy/  hypertension   Family history of adverse reaction to anesthesia    father hard to put down for anesthesia   GERD (gastroesophageal reflux disease)    H/O radioactive iodine thyroid  ablation    1992  right throid   History of exercise intolerance    01-052011  ETT--  normal w/ no ischemia per cardiologist (dr rolan) epic note   History of pulmonary embolus (PE)    07/ 2008-- treated w/ coumadin   History of syncope    recurrent --  workup done per epic -- dx orthostatic hypotension   Hyperlipidemia    Hypertension    IBS (irritable bowel syndrome)    Neuromuscular disorder (HCC)    carpal tunnel   Pelvic pain in female    Post-surgical hypothyroidism    1991 left thyroidectomy   Pre-diabetes    Proteinuria    Seasonal and perennial allergic rhinitis    Wears contact lenses    Wears glasses    Past Surgical History:  Procedure Laterality Date   69 HOUR PH STUDY N/A 11/16/2016   Procedure: 24 HOUR PH STUDY;  Surgeon: Victory LITTIE Legrand DOUGLAS, MD;  Location: [facility];  Service:  Gastroenterology;  Laterality: N/A;   ABDOMINAL HYSTERECTOMY  1998   CARPAL TUNNEL RELEASE Right 06/23/2024   Procedure: CARPAL TUNNEL RELEASE;  Surgeon: Arlinda Buster, MD;  Location: [facility];  Service: Orthopedics;  Laterality: Right;   COLONOSCOPY     COLONOSCOPY WITH PROPOFOL  N/A 08/05/2021   Procedure: COLONOSCOPY WITH PROPOFOL ;  Surgeon: Federico Rosario BROCKS, MD;  Location: [facility];  Service: Gastroenterology;  Laterality: N/A;   CYSTOSCOPY N/A 04/28/2016   Procedure: CYSTOSCOPY;  Surgeon: Norleen Skill, MD;  Location: [facility];  Service: Gynecology;  Laterality: N/A;   ESOPHAGEAL MANOMETRY N/A 11/16/2016   Procedure: ESOPHAGEAL MANOMETRY (EM);  Surgeon: Victory LITTIE Legrand DOUGLAS, MD;  Location: [facility];  Service: Gastroenterology;  Laterality: N/A;   ESOPHAGOGASTRODUODENOSCOPY     FINGER ARTHROPLASTY Right 06/23/2024   Procedure: ARTHROPLASTY, FINGER;  Surgeon: Arlinda Buster, MD;  Location: [facility];  Service: Orthopedics;  Laterality: Right;  RIGHT THUMB CARPOMATACARPAL ARTHROPLASTY WITH INTERNAL BRACE/ RIGHT OPEN CARPAL TUNNEL RELEASE   LAPAROSCOPIC CHOLECYSTECTOMY  1997   LAPAROSCOPY N/A 03/26/2016   Procedure: LAPAROSCOPY DIAGNOSTIC, LYSIS OF ADHESIONS;  Surgeon: Norleen Skill, MD;  Location: [facility];  Service: Gynecology;  Laterality: N/A;   LAPAROTOMY N/A  04/28/2016   Procedure: EXPLORATORY LAPAROTOMY;  Surgeon: Norleen Skill, MD;  Location: [facility];  Service: Gynecology;  Laterality: N/A;   RENAL BIOPSY     SALPINGOOPHORECTOMY Bilateral 04/28/2016   Procedure: Bilateral OOPHORECTOMY;  Surgeon: Norleen Skill, MD;  Location: [facility];  Service: Gynecology;  Laterality: Bilateral;   THYROIDECTOMY Left 1991   TRANSTHORACIC ECHOCARDIOGRAM  04/27/2012   normal LV, ef 60-65%/  trivial MR   TUBAL LIGATION  1992   Patient Active Problem List   Diagnosis Date Noted   Arthritis of carpometacarpal [facility]) joint of right thumb 06/23/2024   Chronic  pain of right wrist 08/16/2023   Hypomagnesemia 10/06/2021   Hypercalcemia 10/06/2021   Colon cancer screening    Carpal tunnel syndrome, right upper limb 01/07/2021   Flexor carpi radialis tendinitis 01/07/2021   Vitamin D  deficiency 05/19/2019   IGT (impaired glucose tolerance) 05/19/2019   Exposure to medical diagnostic radiation 02/09/2017   S/P bilateral salpingo-oophorectomy 04/28/2016   Pelvic adhesions 03/26/2016    Class: Present on Admission   History of pulmonary embolism 08/01/2014   CKD (chronic kidney disease) stage 3, GFR 30-59 ml/min (HCC) 02/27/2014   Hypothyroidism 04/28/2012   Hemorrhage of rectum and anus 02/09/2011   Constipation 02/09/2011   Dyslipidemia 08/26/2009   Backache 02/14/2008   Allergic rhinitis 11/15/2007   GERD 06/07/2007   Anxiety state 04/27/2007   Essential hypertension 04/27/2007   Disorder of kidney and ureter 03/17/2007   EMBOLISM/THROMBOSIS, DEEP VSL PRXML LWR EXTRM 03/16/2007    ONSET DATE: 05/25/2024 (Date of referral); DOS: 06/23/2024  REFERRING DIAG:  M18.11 (ICD-10-CM) - Arthritis of carpometacarpal (CMC) joint of right thumb  G56.01 (ICD-10-CM) - Carpal tunnel syndrome, right upper limb   THERAPY DIAG:  Stiffness of right wrist, not elsewhere classified  Other symptoms and signs involving the musculoskeletal system  Pain in joint of right hand  Other lack of coordination  Muscle weakness (generalized)  Pain in right wrist  Stiffness of left hand, not elsewhere classified  Pain in left wrist  Rationale for Evaluation and Treatment: Rehabilitation  SUBJECTIVE:   SUBJECTIVE STATEMENT:  She returned to work on Monday and has had mild increase to RUE pain. She was unable to return on light duty, and therefore, she is completing fully work duties. No concern with home pain management and HEP completion as needed for further remediation efforts.   Pt accompanied by: husband  PERTINENT HISTORY: Pt was seen in 2024 and   2025 by OP OT to address Rt hand/wrist/thumb pain the underwent right thumb carpometacarpal arthroplasty with tendon transfer flexor carpi radialis to abductor pollicis longus. Right partial trapezoidectomy. Right wrist first extensor compartment release. Right open carpal tunnel release.  on 06/23/2024 after conservative measures were unsuccessful.   PRECAUTIONS: per MD 07/31/24: She is doing well postoperatively.  She can convert her wrist splint to a hand-based thumb spica orthosis at this point as well.  Continue with range of motion exercises for the next 2 weeks, progressive strengthening at week 8 per protocol.   As per [place]  Hand Protocol for post op Right [facility] arthroplasty and pertinent history/MD notes above.  WEIGHT BEARING RESTRICTIONS:Yes , NWB, No functional use right hand/wrist/thumb  PAIN:  Are you having pain? Yes: NPRS scale: 3-4/10 Pain location: R thumb Pain description: sore Aggravating factors: return to work/lifting wheelchairs Relieving factors: rest  FALLS: Has patient fallen in last 6 months? Yes. Number of falls 1 - 8/21 while assisting a pt, pt had muscle spasms  in back as a result  PLOF: Independent  PATIENT GOALS: To improve use of RUE  NEXT MD VISIT:   OBJECTIVE: (All objective assessments below are from initial evaluation on: 07/06/2024 unless otherwise specified.)   HAND DOMINANCE: Right   ADLs: Overall ADLs: mostly mod I though requires occasional assistance from husband for fasteners.   FUNCTIONAL OUTCOME MEASURES: QuickDASH: 88.6 % with use of RUE  09/13/2024: 22.7 % disability with use of RUE   UPPER EXTREMITY ROM:     AROM Right (eval) Left (eval)  Shoulder flexion WNL WNL  Shoulder abduction  WNL  Elbow flexion WFL WNL  Elbow extension WFL with increased pain WNL  Wrist flexion  WNL  Wrist extension  WNL  Wrist pronation  WNL  Wrist supination  WNL   Digit Composite Flexion  WNL  Digit Composite Extension  WNL  Digit Opposition   WNL  (Blank rows = not tested)  HAND FUNCTION: updated 09/13/2024 Grip strength: Right: 21.3 lbs; Left: 66.5 lbs  Lateral pinch: Right: 11 lbs, Left: 16 lbs  3 point pinch: Right: 10 lbs, Left: 15 lbs   COORDINATION: updated 09/13/2024 9 Hole Peg test: Right: 24 sec; Left: 20 sec  SENSATION: Eval:  severe paresthesias reported through RUE  EDEMA:   Eval: mild to moderate edema appreciated  COGNITION: Eval: Overall cognitive status: WFL for evaluation today   OBSERVATIONS:   Pt ambulates without use of AD. No loss of balance. The pt appears well kept and has arm sling donned to RUE. Incisions scabbed over following suture removal. Pt holds RUE in guarded positioning including palm.  TODAY'S TREATMENT:  - Self-care/home management completed for duration as noted below including: Objective measures assessed as noted in Goals section to determine progression towards goals. Discussed options for R thumb spica or CMC thumb support brace that pt can wear while at work for protection and pain management following return to work with increased physical demands.   Kinesio tape to RUE thumb and wrist for stability, edema relief, and scar mobilization using 5 I strips. Pt verbalized understanding of tape and wearing schedule with application on affected extremity. Pictures and written instructions provided for carryover.  OT educated patient on how to obtain Kinesiotape or similar tape for home completion. Handouts provided.  OT provided education with respect to normal return to work expectations and OT d/c. She was advised to follow-up with ortho should any issues arise.   PATIENT EDUCATION: Education details: see above Person educated: Patient and spouse Education method: Engineer, Structural, demonstration, Teach back; handouts Education comprehension: States and demonstrates understanding,   HOME EXERCISE PROGRAM: 07/06/2024: R digit and wrist ROM (excluding thumb); contrast baths;  heat 08/23/2024: yellow putty and wrist stretches Access Code: [email or web address] 09/13/2024: RUE red theraband and red putty updated  Access Code: [email or web address]  GOALS: SHORT TERM GOALS:   MET 2/2  LONG TERM GOALS: Target Date: 09/22/2024  Patient will demonstrate at least 16% improvement with quick Dash score (reporting 72.6% disability or less) indicating improved functional use of affected extremity.  Baseline: 88.6% disability with use fo RUE.  09/13/2024: 22.7 % disability with use of RUE Goal status: MET   2.  Pt will improve grip strength in right hand from unable to assess to at least 15 lbs for functional use at home and in IADLs. 09/13/2024: Right: 21.3 lbs Goal status: MET   3.  Pt will improve A/ROM in right thumb from opposition to tip of index finger on right to  at least opposition to PIP (or greater) of right small finger, to have functional motion for tasks like ADLs, reach, and grasp.  Goal status: MET   4.  Pt will improve right hand functional use from unable to assess to at least Mod I for basic ADLs (ie, brushing her hair, bathing, brushing her teeth and folding laundry) to assist in ability to carry out selfcare and higher-level homecare tasks with less difficulty. Goal status: MET   5.  Pt will improve coordination skills in right hand, as seen by better score on 9 hole peg testing to have increased functional ability to carry out fine motor tasks (fasteners, etc.) and more complex, coordinated IADLs (meal prep, sports, etc.).  Goal status: Deferred   6.  Pt will decrease pain at worst from 7/10 to 2/10 or less to have better sleep and occupational participation in daily roles. Goal status: IN Progress  ASSESSMENT:  OCCUPATIONAL THERAPY DISCHARGE SUMMARY  Visits from Start of Care: 11  Current functional level related to goals / functional outcomes: Patient has met 2/2 short-term goals and 4/5 long-term goals to date.    Remaining deficits: Pt remains impacted by RUE pain  consistent with post-operative stage of healing and recent return to work. She has strategies to minimize affects of these symptoms on occupational performance   Education / Equipment: Continue with HEP and strategies following OT d/c to maximize function, manage pain, and maximize safety and overall level of independence.    Patient agrees to discharge. Patient goals were partially met. Patient is being discharged due to being pleased with the current functional level.SABRA  CLINICAL IMPRESSION: Patient is a 54 y.o. female who was seen today for occupational therapy treatment for R [facility] arthroplasty and CTR 06/23/2024.  Good progression towards goals with unmet goal due to mild pain flare following recent return to work without option for light duty. Patient is appropriate for discharge and no longer demonstrates medical necessity for continued skilled occupational therapy services.  PLAN:  OT D/C Completed  CONSULTED AND AGREED WITH PLAN OF CARE: Patient and spouse  Jocelyn CHRISTELLA Bottom, OTR/L 09/20/2024, 3:14 PM   "

## 2024-10-09 ENCOUNTER — Encounter: Payer: PRIVATE HEALTH INSURANCE | Admitting: Orthopedic Surgery

## 2025-05-11 ENCOUNTER — Ambulatory Visit: Admitting: Endocrinology
